# Patient Record
Sex: Male | Born: 1945 | Race: Black or African American | Hispanic: No | Marital: Married | Smoking: Former smoker
Health system: Southern US, Community
[De-identification: ages and names within clinical notes are randomized; demographics above are authoritative.]

## PROBLEM LIST (undated history)

## (undated) DIAGNOSIS — I10 Essential (primary) hypertension: Secondary | ICD-10-CM

## (undated) DIAGNOSIS — Z9889 Other specified postprocedural states: Secondary | ICD-10-CM

## (undated) DIAGNOSIS — E785 Hyperlipidemia, unspecified: Secondary | ICD-10-CM

## (undated) DIAGNOSIS — I2699 Other pulmonary embolism without acute cor pulmonale: Secondary | ICD-10-CM

## (undated) DIAGNOSIS — N189 Chronic kidney disease, unspecified: Secondary | ICD-10-CM

## (undated) DIAGNOSIS — R7303 Prediabetes: Secondary | ICD-10-CM

## (undated) DIAGNOSIS — K219 Gastro-esophageal reflux disease without esophagitis: Secondary | ICD-10-CM

## (undated) DIAGNOSIS — D132 Benign neoplasm of duodenum: Secondary | ICD-10-CM

## (undated) DIAGNOSIS — E119 Type 2 diabetes mellitus without complications: Secondary | ICD-10-CM

## (undated) DIAGNOSIS — M109 Gout, unspecified: Secondary | ICD-10-CM

## (undated) DIAGNOSIS — Z8719 Personal history of other diseases of the digestive system: Secondary | ICD-10-CM

## (undated) DIAGNOSIS — G4730 Sleep apnea, unspecified: Secondary | ICD-10-CM

## (undated) DIAGNOSIS — Z86010 Personal history of colonic polyps: Secondary | ICD-10-CM

## (undated) DIAGNOSIS — Z860101 Personal history of adenomatous and serrated colon polyps: Secondary | ICD-10-CM

## (undated) DIAGNOSIS — D649 Anemia, unspecified: Secondary | ICD-10-CM

## (undated) HISTORY — DX: Benign neoplasm of duodenum: D13.2

## (undated) HISTORY — DX: Personal history of adenomatous and serrated colon polyps: Z86.0101

## (undated) HISTORY — PX: ABDOMINAL SURGERY: SHX537

## (undated) HISTORY — DX: Hyperlipidemia, unspecified: E78.5

## (undated) HISTORY — DX: Gout, unspecified: M10.9

## (undated) HISTORY — DX: Gastro-esophageal reflux disease without esophagitis: K21.9

## (undated) HISTORY — DX: Personal history of colonic polyps: Z86.010

## (undated) HISTORY — PX: OTHER SURGICAL HISTORY: SHX169

## (undated) HISTORY — DX: Other pulmonary embolism without acute cor pulmonale: I26.99

## (undated) HISTORY — PX: HERNIA REPAIR: SHX51

## (undated) HISTORY — DX: Essential (primary) hypertension: I10

---

## 1979-11-01 HISTORY — PX: ABDOMINAL SURGERY: SHX537

## 1998-05-29 ENCOUNTER — Ambulatory Visit (HOSPITAL_COMMUNITY): Admission: RE | Admit: 1998-05-29 | Discharge: 1998-05-29 | Payer: Self-pay | Admitting: Neurosurgery

## 1998-06-12 ENCOUNTER — Ambulatory Visit (HOSPITAL_COMMUNITY): Admission: RE | Admit: 1998-06-12 | Discharge: 1998-06-13 | Payer: Self-pay | Admitting: Neurosurgery

## 1998-06-26 ENCOUNTER — Encounter: Payer: Self-pay | Admitting: Neurosurgery

## 1998-06-26 ENCOUNTER — Ambulatory Visit (HOSPITAL_COMMUNITY): Admission: RE | Admit: 1998-06-26 | Discharge: 1998-06-27 | Payer: Self-pay | Admitting: Neurosurgery

## 2007-02-01 ENCOUNTER — Ambulatory Visit: Payer: Self-pay | Admitting: Internal Medicine

## 2012-06-14 DIAGNOSIS — R109 Unspecified abdominal pain: Secondary | ICD-10-CM

## 2012-07-04 ENCOUNTER — Encounter: Payer: Self-pay | Admitting: Physician Assistant

## 2012-07-04 DIAGNOSIS — R072 Precordial pain: Secondary | ICD-10-CM

## 2012-07-04 DIAGNOSIS — I471 Supraventricular tachycardia: Secondary | ICD-10-CM

## 2012-07-05 ENCOUNTER — Encounter: Payer: Self-pay | Admitting: Physician Assistant

## 2012-07-05 DIAGNOSIS — I219 Acute myocardial infarction, unspecified: Secondary | ICD-10-CM

## 2013-01-22 ENCOUNTER — Telehealth: Payer: Self-pay | Admitting: Nurse Practitioner

## 2013-01-22 ENCOUNTER — Other Ambulatory Visit: Payer: Medicare HMO

## 2013-01-22 ENCOUNTER — Other Ambulatory Visit: Payer: Self-pay | Admitting: Nurse Practitioner

## 2013-01-22 DIAGNOSIS — R000 Tachycardia, unspecified: Secondary | ICD-10-CM

## 2013-01-22 NOTE — Telephone Encounter (Signed)
Looks like sinus tachycardia. Does he have appointment with cardiology? Was suppose to have come in on 3/17 for INR. Will need to call and make appointment with coumadin clinic.

## 2013-01-22 NOTE — Telephone Encounter (Signed)
Referral sent 

## 2013-01-22 NOTE — Telephone Encounter (Signed)
Oops. See previous note

## 2013-01-22 NOTE — Telephone Encounter (Signed)
Patient was waiting for the results of his holter moniter and he needs to know if he is suppose to come in and have his protime checked.

## 2013-01-22 NOTE — Telephone Encounter (Signed)
Patient is going to make INR appointment today.  Would like a referral to cardiologist please set one up he dose not have one.

## 2013-01-23 ENCOUNTER — Ambulatory Visit (INDEPENDENT_AMBULATORY_CARE_PROVIDER_SITE_OTHER): Payer: Medicare HMO | Admitting: Pharmacist

## 2013-01-23 DIAGNOSIS — I2699 Other pulmonary embolism without acute cor pulmonale: Secondary | ICD-10-CM | POA: Insufficient documentation

## 2013-01-23 LAB — POCT INR: INR: 6.1

## 2013-01-24 ENCOUNTER — Telehealth: Payer: Self-pay | Admitting: *Deleted

## 2013-01-24 NOTE — Telephone Encounter (Signed)
Patient notified that 24 hr holter monitor was normal.

## 2013-01-28 ENCOUNTER — Telehealth: Payer: Self-pay | Admitting: Family Medicine

## 2013-01-28 NOTE — Telephone Encounter (Signed)
Spoke with [facility] and they could not find pt's result

## 2013-01-29 ENCOUNTER — Ambulatory Visit (INDEPENDENT_AMBULATORY_CARE_PROVIDER_SITE_OTHER): Payer: Medicare HMO | Admitting: Pharmacist

## 2013-01-29 DIAGNOSIS — I2699 Other pulmonary embolism without acute cor pulmonale: Secondary | ICD-10-CM

## 2013-01-29 LAB — POCT INR: INR: 1.4

## 2013-02-06 ENCOUNTER — Encounter: Payer: Self-pay | Admitting: Cardiology

## 2013-02-06 ENCOUNTER — Other Ambulatory Visit: Payer: Self-pay | Admitting: Pharmacist

## 2013-02-06 ENCOUNTER — Ambulatory Visit (INDEPENDENT_AMBULATORY_CARE_PROVIDER_SITE_OTHER): Payer: Medicare PPO | Admitting: Cardiology

## 2013-02-06 VITALS — BP 131/73 | HR 69 | Ht 69.0 in | Wt 199.0 lb

## 2013-02-06 DIAGNOSIS — R002 Palpitations: Secondary | ICD-10-CM | POA: Insufficient documentation

## 2013-02-06 NOTE — Progress Notes (Signed)
HPI The patient presents for evaluation of possible supraventricular tachycardia. He was hospitalized with a small bowel obstruction at a [facility] last year. I was able to retrieve the discharge summary.  And after some effort was able to get some other results transmitted. The patient did indeed have an SVT possible ectopic atrial arrhythmia treated with beta blocker. Echocardiogram demonstrated left ventricular hypertrophy. There was some RV dysfunction consistent with pulmonary embolism which was confirmed by CT.Marland Kitchen The patient has been on warfarin. This was to continue for 6 months. The TEE was apparently related to the acute hospitalization and immobility. He never had any problems before this with coagulopathies or hypercoagulable states.  He doesn't recall having any arrhythmias. He was recently placed on a Holter monitor which demonstrated sinus rhythm with rare PACs. He denies any presyncope or syncope. He doesn't notice any palpitations. He denies any chest pressure, neck or arm discomfort. He works at the time pouring concrete.  No Known Allergies  Current Outpatient Prescriptions  Medication Sig Dispense Refill  . allopurinol (ZYLOPRIM) 100 MG tablet Take 100 mg by mouth daily.      Marland Kitchen atorvastatin (LIPITOR) 40 MG tablet Take 40 mg by mouth daily.      . fenofibrate 160 MG tablet Take 160 mg by mouth daily.      Marland Kitchen lisinopril-hydrochlorothiazide (PRINZIDE,ZESTORETIC) 20-25 MG per tablet Take 1 tablet by mouth daily.      . metoprolol (LOPRESSOR) 50 MG tablet Take 50 mg by mouth 2 (two) times daily.      . pantoprazole (PROTONIX) 40 MG tablet Take 40 mg by mouth daily.      Marland Kitchen warfarin (COUMADIN) 10 MG tablet Take 10 mg by mouth daily.       No current facility-administered medications for this visit.    Past Medical History  Diagnosis Date  . Hypertension   . GERD (gastroesophageal reflux disease)   . Hyperlipidemia   . Gout   . PE (pulmonary embolism)     No past surgical  history on file.  Family History  Problem Relation Age of Onset  . Hypertension Mother   . Diabetes Mother   . Hypertension Father   . Ulcers Father   . Hypertension Sister   . Diabetes Sister   . Diabetes Sister     History   Social History  . Marital Status: Married    Spouse Name: N/A    Number of Children: N/A  . Years of Education: N/A   Occupational History  . Not on file.   Social History Main Topics  . Smoking status: Former Smoker    Quit date: 02/07/1995  . Smokeless tobacco: Not on file  . Alcohol Use: Not on file  . Drug Use: Not on file  . Sexually Active: Not on file   Other Topics Concern  . Not on file   Social History Narrative  . No narrative on file    ROS:  Positive for reflux, right leg varicose veins. Otherwise as stated in the HPI and negative for all other systems.  PHYSICAL EXAM BP 131/73  Pulse 69  Ht 5\' 9"  (1.753 m)  Wt 199 lb (90.266 kg)  BMI 29.37 kg/m2 GENERAL:  Well appearing HEENT:  Pupils equal round and reactive, fundi not visualized, oral mucosa unremarkable NECK:  No jugular venous distention, waveform within normal limits, carotid upstroke brisk and symmetric, no bruits, no thyromegaly LYMPHATICS:  No cervical, inguinal adenopathy LUNGS:  Clear to auscultation bilaterally  BACK:  No CVA tenderness CHEST:  Unremarkable HEART:  PMI not displaced or sustained,S1 and S2 within normal limits, no S3, no S4, no clicks, no rubs, no murmurs ABD:  Flat, positive bowel sounds normal in frequency in pitch, no bruits, no rebound, no guarding, no midline pulsatile mass, no hepatomegaly, no splenomegaly EXT:  2 plus pulses throughout, no edema, no cyanosis no clubbing SKIN:  No rashes no nodules NEURO:  Cranial nerves II through XII grossly intact, motor grossly intact throughout PSYCH:  Cognitively intact, oriented to person place and time   EKG:  Sinus, rate and 69, axis within normal limits, intervals within normal limits, diffuse  inferolateral T wave inversion.02/06/2013    ASSESSMENT AND PLAN  SVT:  I have reviewed the hospital records.  He had self-limited SVT and no further therapy is indicated. He will remain on the meds as listed.  PULMONARY EMBOLI:  He has completed a course of anticoagulation and can stop warfarin.  ABNORMAL EKG:  He does have some LVH on echo probably related to hypertension. No further evaluation is warranted. We will continue with the meds as listed.   (Greater than 40 minutes reviewing all data with greater than 50% face to face with the patient).

## 2013-02-06 NOTE — Patient Instructions (Addendum)
Please stop your Coumadin.  Continue all ot her medications as listed.  Follow up as needed

## 2013-02-25 ENCOUNTER — Ambulatory Visit (INDEPENDENT_AMBULATORY_CARE_PROVIDER_SITE_OTHER): Payer: Medicare HMO | Admitting: Pharmacist

## 2013-02-25 DIAGNOSIS — I2699 Other pulmonary embolism without acute cor pulmonale: Secondary | ICD-10-CM

## 2013-04-12 ENCOUNTER — Ambulatory Visit (INDEPENDENT_AMBULATORY_CARE_PROVIDER_SITE_OTHER): Payer: Medicare HMO

## 2013-04-12 ENCOUNTER — Ambulatory Visit (INDEPENDENT_AMBULATORY_CARE_PROVIDER_SITE_OTHER): Payer: Medicare HMO | Admitting: Nurse Practitioner

## 2013-04-12 VITALS — BP 142/80 | HR 59 | Temp 97.1°F | Ht 69.0 in | Wt 200.0 lb

## 2013-04-12 DIAGNOSIS — M549 Dorsalgia, unspecified: Secondary | ICD-10-CM

## 2013-04-12 DIAGNOSIS — E785 Hyperlipidemia, unspecified: Secondary | ICD-10-CM

## 2013-04-12 DIAGNOSIS — I10 Essential (primary) hypertension: Secondary | ICD-10-CM | POA: Insufficient documentation

## 2013-04-12 DIAGNOSIS — K219 Gastro-esophageal reflux disease without esophagitis: Secondary | ICD-10-CM | POA: Insufficient documentation

## 2013-04-12 LAB — POCT URINALYSIS DIPSTICK

## 2013-04-12 LAB — POCT CBC
HCT, POC: 31.5 % — AB (ref 43.5–53.7)
MCHC: 35.4 g/dL (ref 31.8–35.4)
POC Granulocyte: 1.8 — AB (ref 2–6.9)
POC LYMPH PERCENT: 55.7 %L — AB (ref 10–50)
RDW, POC: 13.5 %
WBC: 4.9 10*3/uL (ref 4.6–10.2)

## 2013-04-12 LAB — POCT UA - MICROSCOPIC ONLY: RBC, urine, microscopic: NEGATIVE

## 2013-04-12 MED ORDER — IBUPROFEN 800 MG PO TABS: 800.0000 mg | ORAL_TABLET | Freq: Three times a day (TID) | ORAL | Status: DC | PRN

## 2013-04-12 MED ORDER — CYCLOBENZAPRINE HCL 5 MG PO TABS: 5.0000 mg | ORAL_TABLET | Freq: Three times a day (TID) | ORAL | Status: DC | PRN

## 2013-04-12 NOTE — Patient Instructions (Signed)

## 2013-04-12 NOTE — Progress Notes (Signed)
  Subjective:    Patient ID: Andrew Adams, male    DOB: [birth year], 68 y.o.   MRN: [id number]  HPI Patient in today c/o right flank pain- started 2 weeks ago- has gotten worse- Patient hasn't taken anything other than Ibuprofen which has helped a little . Nothing seems to help the pain- twisting body to the left increases pain. Walking increases pain. Rates pain a 7-8/10.    Review of Systems  Constitutional: Negative for fever, appetite change and fatigue.  Respiratory: Negative.   Cardiovascular: Negative.   Genitourinary: Negative for dysuria, hematuria, scrotal swelling and testicular pain.  Musculoskeletal: Positive for back pain.       Objective:   Physical Exam  Constitutional: He appears well-developed and well-nourished.  Cardiovascular: Normal rate and normal heart sounds.   Pulmonary/Chest: Effort normal and breath sounds normal.  Pain along 7th rib posteriorly  Genitourinary:  No CVA tenderness  Musculoskeletal:  Decrease ROM of upper body due to pain on rotation to the left.    BP 142/80  Pulse 59  Temp(Src) 97.1 F (36.2 C) (Oral)  Ht 5\' 9"  (1.753 m)  Wt 200 lb (90.719 kg)  BMI 29.52 kg/m2 Chest xray- no acute findings-Preliminary reading by Ronnald Collum, FNP  [facility] KUB- Moderate amount stool in colon-.mmmx  .this     Assessment & Plan:  Right mid back pain  All labs and xrays negative  Motrin 800 mg 1 po Q6 prn  Flexeril 5 mg 1 po tid prn  Moist heat  Rest  Avoid heavy lifting  Mary-Margaret Hassell Done, FNP

## 2013-04-12 NOTE — Addendum Note (Signed)
Addended by: Chevis Pretty on: 04/12/2013 03:09 PM   Modules accepted: Orders

## 2013-04-13 LAB — COMPLETE METABOLIC PANEL WITH GFR
Alkaline Phosphatase: 24 U/L — ABNORMAL LOW (ref 39–117)
GFR, Est Non African American: 52 mL/min — ABNORMAL LOW
Glucose, Bld: 96 mg/dL (ref 70–99)
Sodium: 143 mEq/L (ref 135–145)
Total Bilirubin: 0.4 mg/dL (ref 0.3–1.2)
Total Protein: 7 g/dL (ref 6.0–8.3)

## 2013-04-15 ENCOUNTER — Telehealth: Payer: Self-pay | Admitting: Nurse Practitioner

## 2013-04-23 NOTE — Telephone Encounter (Signed)
Patient aware of labs and is feeling better

## 2013-05-07 ENCOUNTER — Ambulatory Visit (INDEPENDENT_AMBULATORY_CARE_PROVIDER_SITE_OTHER): Payer: Medicare HMO | Admitting: Nurse Practitioner

## 2013-05-07 ENCOUNTER — Encounter: Payer: Self-pay | Admitting: Nurse Practitioner

## 2013-05-07 VITALS — BP 152/78 | HR 61 | Temp 97.1°F | Ht 69.0 in | Wt 198.0 lb

## 2013-05-07 DIAGNOSIS — M109 Gout, unspecified: Secondary | ICD-10-CM

## 2013-05-07 DIAGNOSIS — E785 Hyperlipidemia, unspecified: Secondary | ICD-10-CM

## 2013-05-07 DIAGNOSIS — R109 Unspecified abdominal pain: Secondary | ICD-10-CM

## 2013-05-07 DIAGNOSIS — M79644 Pain in right finger(s): Secondary | ICD-10-CM

## 2013-05-07 DIAGNOSIS — I10 Essential (primary) hypertension: Secondary | ICD-10-CM

## 2013-05-07 DIAGNOSIS — K219 Gastro-esophageal reflux disease without esophagitis: Secondary | ICD-10-CM

## 2013-05-07 DIAGNOSIS — M79609 Pain in unspecified limb: Secondary | ICD-10-CM

## 2013-05-07 LAB — COMPLETE METABOLIC PANEL WITH GFR
Albumin: 4.5 g/dL (ref 3.5–5.2)
BUN: 24 mg/dL — ABNORMAL HIGH (ref 6–23)
CO2: 31 mEq/L (ref 19–32)
Calcium: 10.1 mg/dL (ref 8.4–10.5)
Chloride: 101 mEq/L (ref 96–112)
GFR, Est African American: 48 mL/min — ABNORMAL LOW
GFR, Est Non African American: 41 mL/min — ABNORMAL LOW
Glucose, Bld: 92 mg/dL (ref 70–99)
Potassium: 4.3 mEq/L (ref 3.5–5.3)
Sodium: 140 mEq/L (ref 135–145)
Total Protein: 7 g/dL (ref 6.0–8.3)

## 2013-05-07 LAB — POCT URINALYSIS DIPSTICK
Blood, UA: NEGATIVE
Ketones, UA: NEGATIVE
Protein, UA: NEGATIVE
Spec Grav, UA: 1.01

## 2013-05-07 MED ORDER — METOPROLOL TARTRATE 50 MG PO TABS: 50.0000 mg | ORAL_TABLET | Freq: Two times a day (BID) | ORAL | Status: DC

## 2013-05-07 MED ORDER — ATORVASTATIN CALCIUM 40 MG PO TABS: 40.0000 mg | ORAL_TABLET | Freq: Every day | ORAL | Status: DC

## 2013-05-07 MED ORDER — ALLOPURINOL 100 MG PO TABS: 100.0000 mg | ORAL_TABLET | Freq: Every day | ORAL | Status: DC

## 2013-05-07 MED ORDER — PANTOPRAZOLE SODIUM 40 MG PO TBEC: 40.0000 mg | DELAYED_RELEASE_TABLET | Freq: Every day | ORAL | Status: DC

## 2013-05-07 MED ORDER — LISINOPRIL-HYDROCHLOROTHIAZIDE 20-12.5 MG PO TABS: 2.0000 | ORAL_TABLET | Freq: Every day | ORAL | Status: DC

## 2013-05-07 MED ORDER — FENOFIBRATE 160 MG PO TABS: 160.0000 mg | ORAL_TABLET | Freq: Every day | ORAL | Status: DC

## 2013-05-07 NOTE — Patient Instructions (Addendum)

## 2013-05-07 NOTE — Progress Notes (Signed)
Subjective:    Patient ID: Andrew Adams, male    DOB: 1945-11-30, 67 y.o.   MRN: [id number]  Hyperlipidemia This is a chronic problem. The current episode started more than 1 year ago. The problem is uncontrolled. Recent lipid tests were reviewed and are high. He has no history of hypothyroidism. Pertinent negatives include no leg pain, myalgias or shortness of breath. Current antihyperlipidemic treatment includes statins. The current treatment provides mild improvement of lipids. Risk factors for coronary artery disease include dyslipidemia, hypertension and male sex.  Hypertension This is a chronic problem. The current episode started more than 1 year ago. The problem has been gradually worsening since onset. The problem is uncontrolled. Pertinent negatives include no anxiety, blurred vision, neck pain, palpitations or shortness of breath. Risk factors for coronary artery disease include dyslipidemia and male gender. Past treatments include ACE inhibitors, diuretics and beta blockers. The current treatment provides no improvement.  Gastrophageal Reflux He reports no coughing or no heartburn. This is a chronic problem. The current episode started more than 1 year ago. The problem occurs rarely. The problem has been resolved. He has tried a PPI for the symptoms. The treatment provided significant relief.   * patient still c/o right thumb pain- said that it "pops and cracks" and gets stuck in flexed position. * when last seen patient was c/o right flank pain- still experiencing that- worse when he lays on left side. Bending over working increases pain- rates 5/10 when it occurs. Laying on right side causes pain to ease off.  Review of Systems  HENT: Negative for neck pain.   Eyes: Negative for blurred vision.  Respiratory: Negative for cough and shortness of breath.   Cardiovascular: Negative for palpitations.  Gastrointestinal: Negative for heartburn.  Musculoskeletal: Positive for joint  swelling (rigth thumb). Negative for myalgias.       Objective:   Physical Exam  Vitals reviewed. Constitutional: He is oriented to person, place, and time. He appears well-developed and well-nourished.  HENT:  Head: Normocephalic.  Right Ear: External ear normal.  Left Ear: External ear normal.  Eyes: Pupils are equal, round, and reactive to light.  Neck: Neck supple. No thyromegaly present.  Cardiovascular: Normal rate, regular rhythm, normal heart sounds and intact distal pulses.   Pulmonary/Chest: Effort normal and breath sounds normal.  Abdominal: Soft. Bowel sounds are normal. There is no tenderness.  Genitourinary:  Right CVA tenderness  Musculoskeletal: Normal range of motion. He exhibits no edema.  Pain at base of right thumb with thumb flexion- unable to extend thumb independently must passively extend thumb  Neurological: He is alert and oriented to person, place, and time.  Skin: Skin is warm and dry.  Psychiatric: He has a normal mood and affect. His behavior is normal. Judgment and thought content normal.    BP 156/93  Pulse 61  Temp(Src) 97.1 F (36.2 C) (Oral)  Ht 5\' 9"  (1.753 m)  Wt 198 lb (89.812 kg)  BMI 29.23 kg/m2  Results for orders placed in visit on 05/07/13  POCT URINALYSIS DIPSTICK      Result Value Range   Color, UA yellow     Clarity, UA clear     Glucose, UA neg     Bilirubin, UA neg     Ketones, UA neg     Spec Grav, UA 1.010     Blood, UA neg     pH, UA 6.0     Protein, UA neg     Urobilinogen, UA  negative     Nitrite, UA neg     Leukocytes, UA Negative         Assessment & Plan:  1. Hypertension Low NA+ diet - lisinopril-hydrochlorothiazide (ZESTORETIC) 20-12.5 MG per tablet; Take 2 tablets by mouth daily.  Dispense: 180 tablet; Refill: 1 - metoprolol (LOPRESSOR) 50 MG tablet; Take 1 tablet (50 mg total) by mouth 2 (two) times daily.  Dispense: 180 tablet; Refill: 1 - COMPLETE METABOLIC PANEL WITH GFR  2. Hyperlipidemia with  target LDL less than 100 Low fat diet and exercise - atorvastatin (LIPITOR) 40 MG tablet; Take 1 tablet (40 mg total) by mouth daily.  Dispense: 90 tablet; Refill: 1 - fenofibrate 160 MG tablet; Take 1 tablet (160 mg total) by mouth daily.  Dispense: 90 tablet; Refill: 1 - NMR Lipoprofile with Lipids  3. GERD (gastroesophageal reflux disease) Avoid eating 2 hours prior to bedtime - pantoprazole (PROTONIX) 40 MG tablet; Take 1 tablet (40 mg total) by mouth daily.  Dispense: 90 tablet; Refill: 1  4. Gout  - allopurinol (ZYLOPRIM) 100 MG tablet; Take 1 tablet (100 mg total) by mouth daily.  Dispense: 90 tablet; Refill: 1  5. Right flank pain Force fluids Moist heat Stretching exercises - POCT urinalysis dipstick  6. Thumb pain, right Referral to ortho  Mary-Margaret Hassell Done, FNP

## 2013-05-08 LAB — NMR LIPOPROFILE WITH LIPIDS
HDL Size: 8.1 nm — ABNORMAL LOW (ref >=9.2)
LDL Size: 20.3 nm — ABNORMAL LOW (ref >20.5)
Large HDL-P: <1.3 umol/L — ABNORMAL LOW (ref >=4.8)
Large VLDL-P: <0.8 nmol/L (ref <=2.7)
Small LDL Particle Number: 243 nmol/L (ref <=527)
Triglycerides: 76 mg/dL (ref <150)

## 2013-09-16 ENCOUNTER — Telehealth: Payer: Self-pay | Admitting: Nurse Practitioner

## 2013-09-16 NOTE — Telephone Encounter (Signed)
Where is hernia

## 2013-09-17 NOTE — Telephone Encounter (Signed)
On his stomach would like to go to doctor Juliann Pulse that's who done the first surgery

## 2013-09-17 NOTE — Telephone Encounter (Signed)
ok 

## 2013-09-17 NOTE — Telephone Encounter (Signed)
[facility] in [place] patient states the hernia is above the navle. Has an appointment with you in the morning will just go over it with you then.

## 2013-09-17 NOTE — Telephone Encounter (Signed)
Can't find Dr. Tye Maryland in computer. Who is they want to see?- Is hernia in belly button or where?

## 2013-09-18 ENCOUNTER — Ambulatory Visit (INDEPENDENT_AMBULATORY_CARE_PROVIDER_SITE_OTHER): Payer: Medicare HMO | Admitting: Nurse Practitioner

## 2013-09-18 ENCOUNTER — Encounter: Payer: Self-pay | Admitting: Nurse Practitioner

## 2013-09-18 VITALS — BP 142/85 | HR 58 | Temp 97.0°F | Ht 69.0 in | Wt 200.0 lb

## 2013-09-18 DIAGNOSIS — M109 Gout, unspecified: Secondary | ICD-10-CM

## 2013-09-18 DIAGNOSIS — E785 Hyperlipidemia, unspecified: Secondary | ICD-10-CM

## 2013-09-18 DIAGNOSIS — I10 Essential (primary) hypertension: Secondary | ICD-10-CM

## 2013-09-18 DIAGNOSIS — K432 Incisional hernia without obstruction or gangrene: Secondary | ICD-10-CM

## 2013-09-18 DIAGNOSIS — Z125 Encounter for screening for malignant neoplasm of prostate: Secondary | ICD-10-CM

## 2013-09-18 DIAGNOSIS — K219 Gastro-esophageal reflux disease without esophagitis: Secondary | ICD-10-CM

## 2013-09-18 MED ORDER — ATORVASTATIN CALCIUM 40 MG PO TABS: 40.0000 mg | ORAL_TABLET | Freq: Every day | ORAL | Status: DC

## 2013-09-18 MED ORDER — PANTOPRAZOLE SODIUM 40 MG PO TBEC: 40.0000 mg | DELAYED_RELEASE_TABLET | Freq: Every day | ORAL | Status: DC

## 2013-09-18 MED ORDER — AMLODIPINE BESYLATE 5 MG PO TABS: 5.0000 mg | ORAL_TABLET | Freq: Every day | ORAL | Status: DC

## 2013-09-18 MED ORDER — FENOFIBRATE 160 MG PO TABS: 160.0000 mg | ORAL_TABLET | Freq: Every day | ORAL | Status: DC

## 2013-09-18 MED ORDER — LISINOPRIL-HYDROCHLOROTHIAZIDE 20-12.5 MG PO TABS: 2.0000 | ORAL_TABLET | Freq: Every day | ORAL | Status: DC

## 2013-09-18 MED ORDER — ALLOPURINOL 100 MG PO TABS: 100.0000 mg | ORAL_TABLET | Freq: Every day | ORAL | Status: DC

## 2013-09-18 NOTE — Progress Notes (Signed)
Subjective:    Patient ID: Andrew Adams, male    DOB: 10/06/1946, 67 y.o.   MRN: [id number]  Hyperlipidemia This is a chronic problem. The current episode started more than 1 year ago. The problem is uncontrolled. Recent lipid tests were reviewed and are high. He has no history of hypothyroidism. Pertinent negatives include no leg pain, myalgias or shortness of breath. Current antihyperlipidemic treatment includes statins. The current treatment provides mild improvement of lipids. Risk factors for coronary artery disease include dyslipidemia, hypertension and male sex.  Hypertension This is a chronic problem. The current episode started more than 1 year ago. The problem has been gradually worsening since onset. The problem is uncontrolled. Pertinent negatives include no anxiety, blurred vision, neck pain, palpitations or shortness of breath. Risk factors for coronary artery disease include dyslipidemia and male gender. Past treatments include ACE inhibitors, diuretics and beta blockers. The current treatment provides no improvement.  Gastrophageal Reflux He reports no coughing or no heartburn. This is a chronic problem. The current episode started more than 1 year ago. The problem occurs rarely. The problem has been resolved. He has tried a PPI for the symptoms. The treatment provided significant relief.   * Patient c/o trouble maintaining an erection since we added metoprolol- he would like to stop taking to see if it helps.    * Review of Systems  Eyes: Negative for blurred vision.  Respiratory: Negative for cough and shortness of breath.   Cardiovascular: Negative for palpitations.  Gastrointestinal: Negative for heartburn.  Musculoskeletal: Positive for joint swelling (rigth thumb). Negative for myalgias and neck pain.       Objective:   Physical Exam  Vitals reviewed. Constitutional: He is oriented to person, place, and time. He appears well-developed and well-nourished.  HENT:   Head: Normocephalic.  Right Ear: External ear normal.  Left Ear: External ear normal.  Eyes: Pupils are equal, round, and reactive to light.  Neck: Neck supple. No thyromegaly present.  Cardiovascular: Normal rate, regular rhythm, normal heart sounds and intact distal pulses.   Pulmonary/Chest: Effort normal and breath sounds normal.  Abdominal: Soft. Bowel sounds are normal. There is no tenderness.  Incisional hernia upper abdominal wall  Genitourinary:  Right CVA tenderness  Musculoskeletal: Normal range of motion. He exhibits no edema.  Pain at base of right thumb with thumb flexion- unable to extend thumb independently must passively extend thumb  Neurological: He is alert and oriented to person, place, and time.  Skin: Skin is warm and dry.  Psychiatric: He has a normal mood and affect. His behavior is normal. Judgment and thought content normal.   BP 142/85  Pulse 58  Temp(Src) 97 F (36.1 C) (Oral)  Ht 5\' 9"  (1.753 m)  Wt 200 lb (90.719 kg)  BMI 29.52 kg/m2     Assessment & Plan:   1. Hypertension   2. GERD (gastroesophageal reflux disease)   3. Hyperlipidemia with target LDL less than 100   4. Gout   5. Prostate cancer screening   6. Incisional hernia    Orders Placed This Encounter  Procedures  . CMP14+EGFR  . NMR, lipoprofile  . PSA, total and free  . Ambulatory referral to General Surgery    Referral Priority:  Routine    Referral Type:  Surgical    Referral Reason:  Specialty Services Required    Requested Specialty:  General Surgery    Number of Visits Requested:  1   Meds ordered this encounter  Medications  .  lisinopril-hydrochlorothiazide (PRINZIDE,ZESTORETIC) 20-12.5 MG per tablet    Sig: Take 2 tablets by mouth daily.    Dispense:  180 tablet    Refill:  1    Order Specific Question:  Supervising Provider    Answer:  Chipper Herb [[id number]]  . amLODipine (NORVASC) 5 MG tablet    Sig: Take 1 tablet (5 mg total) by mouth daily.    Dispense:   90 tablet    Refill:  1    Order Specific Question:  Supervising Provider    Answer:  Chipper Herb [[id number]]  . pantoprazole (PROTONIX) 40 MG tablet    Sig: Take 1 tablet (40 mg total) by mouth daily.    Dispense:  90 tablet    Refill:  1    Order Specific Question:  Supervising Provider    Answer:  Chipper Herb [[id number]]  . atorvastatin (LIPITOR) 40 MG tablet    Sig: Take 1 tablet (40 mg total) by mouth daily.    Dispense:  90 tablet    Refill:  1    Order Specific Question:  Supervising Provider    Answer:  Chipper Herb [[id number]]  . fenofibrate 160 MG tablet    Sig: Take 1 tablet (160 mg total) by mouth daily.    Dispense:  90 tablet    Refill:  1    Order Specific Question:  Supervising Provider    Answer:  Chipper Herb [[id number]]  . allopurinol (ZYLOPRIM) 100 MG tablet    Sig: Take 1 tablet (100 mg total) by mouth daily.    Dispense:  90 tablet    Refill:  1    Order Specific Question:  Supervising Provider    Answer:  Chipper Herb [[id number]]  stopped metoprolol- added norvasc 5 mg daily- patient will let me know if has any problems Keep diary of blood pressure Continue all meds Labs pending Diet and exercise encouraged Health maintenance reviewed Follow up in 3 months  [place], FNP

## 2013-09-18 NOTE — Patient Instructions (Signed)

## 2013-09-19 LAB — CMP14+EGFR
AST: 20 IU/L (ref 0–40)
Alkaline Phosphatase: 23 IU/L — ABNORMAL LOW (ref 39–117)
BUN/Creatinine Ratio: 14 (ref 10–22)
BUN: 18 mg/dL (ref 8–27)
CO2: 29 mmol/L (ref 18–29)
Calcium: 9.6 mg/dL (ref 8.6–10.2)
Chloride: 96 mmol/L — ABNORMAL LOW (ref 97–108)
Creatinine, Ser: 1.33 mg/dL — ABNORMAL HIGH (ref 0.76–1.27)
GFR calc Af Amer: 63 mL/min/{1.73_m2} (ref >59)
Globulin, Total: 2.2 g/dL (ref 1.5–4.5)
Sodium: 139 mmol/L (ref 134–144)
Total Bilirubin: 0.5 mg/dL (ref 0.0–1.2)

## 2013-09-19 LAB — NMR, LIPOPROFILE
Cholesterol: 114 mg/dL (ref <200)
HDL Particle Number: 37.6 umol/L (ref >=30.5)
LDL Particle Number: 697 nmol/L (ref <1000)
LDLC SERPL CALC-MCNC: 54 mg/dL (ref <100)
LP-IR Score: 42 (ref <=45)

## 2013-09-19 LAB — PSA, TOTAL AND FREE
PSA, Free Pct: 36.7 %
PSA: 0.3 ng/mL (ref 0.0–4.0)

## 2013-10-01 NOTE — Telephone Encounter (Signed)
Pt aware of elevated kidney test and other labs normal.

## 2013-11-19 ENCOUNTER — Telehealth: Payer: Self-pay | Admitting: *Deleted

## 2013-11-19 NOTE — Telephone Encounter (Signed)
Telephone call from pt. C/o tongue swelling. Pt having a hard time talking tongue is so swollen. It started yesterday and has gotten worse. No shortness of breath. Had hernia surgery last week per patient. Pt lives in [place]. Advised pt to go to [facility] ER now. Patient will have someone drive him. Pt verbalized understanding and will go to ER now.

## 2013-11-20 ENCOUNTER — Telehealth: Payer: Self-pay | Admitting: Nurse Practitioner

## 2013-11-20 NOTE — Telephone Encounter (Signed)
appt for Friday at 9:45

## 2013-11-22 ENCOUNTER — Encounter: Payer: Self-pay | Admitting: Nurse Practitioner

## 2013-11-22 ENCOUNTER — Ambulatory Visit (INDEPENDENT_AMBULATORY_CARE_PROVIDER_SITE_OTHER): Payer: Medicare HMO | Admitting: Nurse Practitioner

## 2013-11-22 VITALS — BP 128/73 | HR 70 | Temp 98.7°F | Ht 69.0 in | Wt 199.0 lb

## 2013-11-22 DIAGNOSIS — Z09 Encounter for follow-up examination after completed treatment for conditions other than malignant neoplasm: Secondary | ICD-10-CM

## 2013-11-22 DIAGNOSIS — Z87898 Personal history of other specified conditions: Secondary | ICD-10-CM

## 2013-11-22 DIAGNOSIS — I10 Essential (primary) hypertension: Secondary | ICD-10-CM

## 2013-11-22 MED ORDER — METOPROLOL TARTRATE 50 MG PO TABS: 50.0000 mg | ORAL_TABLET | Freq: Two times a day (BID) | ORAL | Status: DC

## 2013-11-22 NOTE — Progress Notes (Signed)
   Subjective:    Patient ID: Andrew Adams, male    DOB: 1946/02/10, 68 y.o.   MRN: [id number]  HPI  Patient here today for hospital follow up- he had angio edema and tongue swelling from lisinopril- Stay ed in hospital for several hours until swelling started to resolve- Patient started taking metoprolol blood pressure meds that he had at home. We had changed him because he thought the metoprolol was interferring with his sexual performance. Says he has not had any problem ssince going back on metoprolol.    Review of Systems  Constitutional: Negative.   HENT: Negative.   Respiratory: Negative.   Cardiovascular: Negative.   Gastrointestinal: Negative.   All other systems reviewed and are negative.       Objective:   Physical Exam  Constitutional: He is oriented to person, place, and time. He appears well-developed and well-nourished.  Cardiovascular: Normal rate, regular rhythm and normal heart sounds.   Pulmonary/Chest: Effort normal and breath sounds normal.  Neurological: He is alert and oriented to person, place, and time.  Skin: Skin is warm.  Psychiatric: He has a normal mood and affect. His behavior is normal. Judgment and thought content normal.   BP 128/73  Pulse 70  Temp(Src) 98.7 F (37.1 C) (Oral)  Ht 5\' 9"  (1.753 m)  Wt 199 lb (90.266 kg)  BMI 29.37 kg/m2        Assessment & Plan:   1. Hospital discharge follow-up   2. H/O angioedema    Continue metoprolol as rx Follow up in 3 months  [place], FNP

## 2013-11-22 NOTE — Patient Instructions (Signed)

## 2013-11-29 ENCOUNTER — Ambulatory Visit (INDEPENDENT_AMBULATORY_CARE_PROVIDER_SITE_OTHER): Payer: Medicare HMO | Admitting: Nurse Practitioner

## 2013-11-29 ENCOUNTER — Ambulatory Visit (INDEPENDENT_AMBULATORY_CARE_PROVIDER_SITE_OTHER): Payer: Medicare HMO

## 2013-11-29 VITALS — BP 150/87 | HR 62 | Temp 98.9°F | Ht 69.0 in | Wt 199.0 lb

## 2013-11-29 DIAGNOSIS — M545 Low back pain, unspecified: Secondary | ICD-10-CM

## 2013-11-29 DIAGNOSIS — R109 Unspecified abdominal pain: Secondary | ICD-10-CM

## 2013-11-29 DIAGNOSIS — R10A1 Flank pain, right side: Secondary | ICD-10-CM

## 2013-11-29 LAB — POCT URINALYSIS DIPSTICK
BILIRUBIN UA: NEGATIVE
Glucose, UA: NEGATIVE
Ketones, UA: NEGATIVE
Leukocytes, UA: NEGATIVE
Nitrite, UA: NEGATIVE
Protein, UA: NEGATIVE
RBC UA: NEGATIVE
SPEC GRAV UA: 1.01
UROBILINOGEN UA: NEGATIVE
pH, UA: 7

## 2013-11-29 NOTE — Progress Notes (Signed)
   Subjective:    Patient ID: Andrew Adams, male    DOB: 14-Sep-1946, 68 y.o.   MRN: [id number]  HPI Patient woke up Wednesday morning with right lower back pain- took pain pills he had at home which helped some- Started hurting again yesterday- hurts worse to lay down. Rates pain 8/10.    Review of Systems  Constitutional: Negative.   HENT: Negative.   Respiratory: Negative.   Cardiovascular: Negative.   Genitourinary: Negative for dysuria, urgency and frequency.  Musculoskeletal: Positive for back pain.  Hematological: Negative.   Psychiatric/Behavioral: Negative.   All other systems reviewed and are negative.       Objective:   Physical Exam  Constitutional: He is oriented to person, place, and time. He appears well-developed and well-nourished.  Cardiovascular: Normal rate, regular rhythm and normal heart sounds.   Pulmonary/Chest: Effort normal and breath sounds normal.  Abdominal: Soft. Bowel sounds are normal.  Genitourinary:  Mild right CVA tenderness  Musculoskeletal:  FRom of lumbar spine with pain on extension Negative SLR bil  Neurological: He is alert and oriented to person, place, and time. He has normal reflexes. No cranial nerve deficit.  Skin: Skin is warm.  Psychiatric: He has a normal mood and affect. His behavior is normal. Judgment and thought content normal.    BP 150/87  Pulse 62  Temp(Src) 98.9 F (37.2 C) (Oral)  Ht 5\' 9"  (1.753 m)  Wt 199 lb (90.266 kg)  BMI 29.37 kg/m2  KUB negative-Preliminary reading by Ronnald Collum, FNP  [facility] Results for orders placed in visit on 11/29/13  POCT URINALYSIS DIPSTICK      Result Value Range   Color, UA gold     Clarity, UA clear     Glucose, UA neg     Bilirubin, UA neg     Ketones, UA neg     Spec Grav, UA 1.010     Blood, UA neg     pH, UA 7.0     Protein, UA neg     Urobilinogen, UA negative     Nitrite, UA neg     Leukocytes, UA Negative          Assessment & Plan:   1. Right flank pain    2. Right low back pain    Pain meds and muscle relaxer as rx Moist heat No strenous activity Follow up if not improving  Mary-Margaret Hassell Done, FNP

## 2013-11-29 NOTE — Patient Instructions (Signed)
Back Pain, Adult Low back pain is very common. About 1 in 5 people have back pain.The cause of low back pain is rarely dangerous. The pain often gets better over time.About half of people with a sudden onset of back pain feel better in just 2 weeks. About 8 in 10 people feel better by 6 weeks.  CAUSES Some common causes of back pain include:  Strain of the muscles or ligaments supporting the spine.  Wear and tear (degeneration) of the spinal discs.  Arthritis.  Direct injury to the back. DIAGNOSIS Most of the time, the direct cause of low back pain is not known.However, back pain can be treated effectively even when the exact cause of the pain is unknown.Answering your caregiver's questions about your overall health and symptoms is one of the most accurate ways to make sure the cause of your pain is not dangerous. If your caregiver needs more information, he or she may order lab work or imaging tests (X-rays or MRIs).However, even if imaging tests show changes in your back, this usually does not require surgery. HOME CARE INSTRUCTIONS For many people, back pain returns.Since low back pain is rarely dangerous, it is often a condition that people can learn to manageon their own.   Remain active. It is stressful on the back to sit or stand in one place. Do not sit, drive, or stand in one place for more than 30 minutes at a time. Take short walks on level surfaces as soon as pain allows.Try to increase the length of time you walk each day.  Do not stay in bed.Resting more than 1 or 2 days can delay your recovery.  Do not avoid exercise or work.Your body is made to move.It is not dangerous to be active, even though your back may hurt.Your back will likely heal faster if you return to being active before your pain is gone.  Pay attention to your body when you bend and lift. Many people have less discomfortwhen lifting if they bend their knees, keep the load close to their bodies,and  avoid twisting. Often, the most comfortable positions are those that put less stress on your recovering back.  Find a comfortable position to sleep. Use a firm mattress and lie on your side with your knees slightly bent. If you lie on your back, put a pillow under your knees.  Only take over-the-counter or prescription medicines as directed by your caregiver. Over-the-counter medicines to reduce pain and inflammation are often the most helpful.Your caregiver may prescribe muscle relaxant drugs.These medicines help dull your pain so you can more quickly return to your normal activities and healthy exercise.  Put ice on the injured area.  Put ice in a plastic bag.  Place a towel between your skin and the bag.  Leave the ice on for 15-20 minutes, 03-04 times a day for the first 2 to 3 days. After that, ice and heat may be alternated to reduce pain and spasms.  Ask your caregiver about trying back exercises and gentle massage. This may be of some benefit.  Avoid feeling anxious or stressed.Stress increases muscle tension and can worsen back pain.It is important to recognize when you are anxious or stressed and learn ways to manage it.Exercise is a great option. SEEK MEDICAL CARE IF:  You have pain that is not relieved with rest or medicine.  You have pain that does not improve in 1 week.  You have new symptoms.  You are generally not feeling well. SEEK   IMMEDIATE MEDICAL CARE IF:   You have pain that radiates from your back into your legs.  You develop new bowel or bladder control problems.  You have unusual weakness or numbness in your arms or legs.  You develop nausea or vomiting.  You develop abdominal pain.  You feel faint. Document Released: 10/17/2005 Document Revised: 04/17/2012 Document Reviewed: 03/07/2011 [employer] Patient Information 2014 [employer].  

## 2013-12-02 ENCOUNTER — Telehealth: Payer: Self-pay | Admitting: Nurse Practitioner

## 2013-12-02 DIAGNOSIS — K6289 Other specified diseases of anus and rectum: Secondary | ICD-10-CM

## 2013-12-02 NOTE — Telephone Encounter (Signed)
Has never seen a GI. Willing to go to [place]. Referral placed.

## 2013-12-02 NOTE — Telephone Encounter (Signed)
Needs to see GI- has he seen one in the past?

## 2013-12-06 ENCOUNTER — Encounter: Payer: Self-pay | Admitting: Gastroenterology

## 2013-12-09 ENCOUNTER — Telehealth: Payer: Self-pay | Admitting: Nurse Practitioner

## 2013-12-09 NOTE — Telephone Encounter (Signed)
Letter sent with appointment date/time 

## 2013-12-23 ENCOUNTER — Ambulatory Visit: Payer: Medicare HMO | Admitting: Nurse Practitioner

## 2014-01-08 ENCOUNTER — Encounter: Payer: Self-pay | Admitting: Gastroenterology

## 2014-01-08 ENCOUNTER — Ambulatory Visit (INDEPENDENT_AMBULATORY_CARE_PROVIDER_SITE_OTHER): Payer: Medicare PPO | Admitting: Gastroenterology

## 2014-01-08 VITALS — BP 144/82 | HR 60 | Ht 69.0 in | Wt 204.6 lb

## 2014-01-08 DIAGNOSIS — K625 Hemorrhage of anus and rectum: Secondary | ICD-10-CM

## 2014-01-08 DIAGNOSIS — K6289 Other specified diseases of anus and rectum: Secondary | ICD-10-CM

## 2014-01-08 MED ORDER — MOVIPREP 100 G PO SOLR: 1.0000 | Freq: Once | ORAL | Status: DC

## 2014-01-08 NOTE — Progress Notes (Signed)
HPI:  This is a    Very pleasant 68 yo man whom I am meeting for the first time today. He is here with his wife.  Low back pain since yesterday while riding his tractor  A month ago, rectal pain.  He knows he has hemorrhoids, they can flare with pain, itching,  bleed every 2-3 months.  Will put on prep H, last for about a week.  He does not ever have constipated.  Has been going on for about 10 years.  No chnages in his bowels, no   abd hernia repaired about a month ago.  1 year ago, SBO surgery.  alll at [facility].  Overall his weight is up over the past year.  Review of systems: Pertinent positive and negative review of systems were noted in the above HPI section. Complete review of systems was performed and was otherwise normal.    Past Medical History  Diagnosis Date  . Hypertension   . GERD (gastroesophageal reflux disease)   . Hyperlipidemia   . Gout   . PE (pulmonary embolism)     Past Surgical History  Procedure Laterality Date  . Abdominal surgery  2103    Smal bowel obstruction   . Abdominal surgery  1981    Perforated large intestine    Current Outpatient Prescriptions  Medication Sig Dispense Refill  . allopurinol (ZYLOPRIM) 100 MG tablet Take 1 tablet (100 mg total) by mouth daily.  90 tablet  1  . amLODipine (NORVASC) 5 MG tablet Take 1 tablet (5 mg total) by mouth daily.  90 tablet  1  . atorvastatin (LIPITOR) 40 MG tablet Take 1 tablet (40 mg total) by mouth daily.  90 tablet  1  . fenofibrate 160 MG tablet Take 1 tablet (160 mg total) by mouth daily.  90 tablet  1  . metoprolol (LOPRESSOR) 50 MG tablet Take 1 tablet (50 mg total) by mouth 2 (two) times daily.  180 tablet  1  . pantoprazole (PROTONIX) 40 MG tablet Take 1 tablet (40 mg total) by mouth daily.  90 tablet  1   No current facility-administered medications for this visit.    Allergies as of 01/08/2014  . (No Known Allergies)    Family History  Problem Relation Age of Onset  .  Hypertension Mother   . Diabetes Mother   . Hypertension Father   . Ulcers Father   . Hypertension Sister   . Diabetes Sister   . Diabetes Sister     History   Social History  . Marital Status: Married    Spouse Name: N/A    Number of Children: 1  . Years of Education: N/A   Occupational History  . Finisher    Social History Main Topics  . Smoking status: Former Smoker    Quit date: 02/07/1995  . Smokeless tobacco: Never Used  . Alcohol Use: No  . Drug Use: No  . Sexual Activity: Not on file   Other Topics Concern  . Not on file   Social History Narrative   Lives with wife.         Physical Exam: BP 144/82  Pulse 60  Ht 5\' 9"  (1.753 m)  Wt 204 lb 9.6 oz (92.806 kg)  BMI 30.20 kg/m2 Constitutional: generally well-appearing Psychiatric: alert and oriented x3 Eyes: extraocular movements intact Mouth: oral pharynx moist, no lesions Neck: supple no lymphadenopathy Cardiovascular: heart regular rate and rhythm Lungs: clear to auscultation bilaterally Abdomen: soft, nontender, nondistended, no  obvious ascites, no peritoneal signs, normal bowel sounds Extremities: no lower extremity edema bilaterally Skin: no lesions on visible extremities Rectal examination: no external anal hemorroids, no distal rectal masses, no clear anal fissures, stool was brown and not checked for hemoccult   Assessment and plan: 68 y.o. male with intermittent rectal beeding, discomfort (hemorrhoid-like symptoms)  Today he has no evidence of anorectal disease on examination however by clinical history he does seem to have intermittent trouble with hemorrhoids or perhaps fissures. He is going to try to bulk his stool with fiber supplements and we'll proceed with colonoscopy at his soonest convenience. He has not had colon cancer screening with colonoscopy for at least 10 years from what he tells me. He is most bothered today by his sore back, he is visibly stiff. I sympathized with him and  suggested he see his primary care physician for advice on low back pain.

## 2014-01-08 NOTE — Patient Instructions (Addendum)
You have been scheduled for a colonoscopy with propofol. Please follow written instructions given to you at your visit today.  Please pick up your prep kit at the pharmacy within the next 1-3 days. If you use inhalers (even only as needed), please bring them with you on the day of your procedure. Your physician has requested that you go to www.[email or web address].com and enter the access code given to you at your visit today. This web site gives a general overview about your procedure. However, you should still follow specific instructions given to you by our office regarding your preparation for the procedure.  Please start taking citrucel (orange flavored) powder fiber supplement.  This may cause some bloating at first but that usually goes away. Begin with a small spoonful and work your way up to a large, heaping spoonful daily over a week.  

## 2014-01-09 ENCOUNTER — Encounter: Payer: Self-pay | Admitting: Family Medicine

## 2014-01-09 ENCOUNTER — Telehealth: Payer: Self-pay | Admitting: Nurse Practitioner

## 2014-01-09 ENCOUNTER — Ambulatory Visit (INDEPENDENT_AMBULATORY_CARE_PROVIDER_SITE_OTHER): Payer: Medicare HMO | Admitting: Family Medicine

## 2014-01-09 VITALS — BP 163/95 | Temp 98.8°F | Ht 69.0 in | Wt 201.6 lb

## 2014-01-09 DIAGNOSIS — N182 Chronic kidney disease, stage 2 (mild): Secondary | ICD-10-CM | POA: Insufficient documentation

## 2014-01-09 DIAGNOSIS — M5430 Sciatica, unspecified side: Secondary | ICD-10-CM | POA: Insufficient documentation

## 2014-01-09 DIAGNOSIS — M549 Dorsalgia, unspecified: Secondary | ICD-10-CM

## 2014-01-09 DIAGNOSIS — K219 Gastro-esophageal reflux disease without esophagitis: Secondary | ICD-10-CM

## 2014-01-09 DIAGNOSIS — E785 Hyperlipidemia, unspecified: Secondary | ICD-10-CM

## 2014-01-09 DIAGNOSIS — I10 Essential (primary) hypertension: Secondary | ICD-10-CM

## 2014-01-09 MED ORDER — METHYLPREDNISOLONE ACETATE 80 MG/ML IJ SUSP
80.0000 mg | Freq: Once | INTRAMUSCULAR | Status: AC
  Administered 2014-01-09: 80 mg via INTRAMUSCULAR

## 2014-01-09 MED ORDER — TRAMADOL HCL 50 MG PO TABS: 50.0000 mg | ORAL_TABLET | Freq: Three times a day (TID) | ORAL | Status: DC | PRN

## 2014-01-09 NOTE — Progress Notes (Signed)
Patient ID: Andrew Adams, male   DOB: [birth year], 68 y.o.   MRN: [id number] SUBJECTIVE: CC: Chief Complaint  Patient presents with  . Acute Visit    pain rt side and lower back no radiation down leg. was riding [employer], [employer]     HPI: Was using his riding mower to lime the lawn and while doing that over the weekend his back got stiff and spasm and having terrible sharp pain in the paralumbar area and the left buttocks  Area. No weakness. No radiculopathy down the legs. No rashes.. No red flags . No urinary or stool incontinence or saddle paresthesias.  Past Medical History  Diagnosis Date  . Hypertension   . GERD (gastroesophageal reflux disease)   . Hyperlipidemia   . Gout   . PE (pulmonary embolism)    Past Surgical History  Procedure Laterality Date  . Abdominal surgery  2103    Smal bowel obstruction   . Abdominal surgery  1981    Perforated large intestine   History   Social History  . Marital Status: Married    Spouse Name: N/A    Number of Children: 1  . Years of Education: N/A   Occupational History  . Finisher    Social History Main Topics  . Smoking status: Former Smoker    Quit date: 02/07/1995  . Smokeless tobacco: Never Used  . Alcohol Use: No  . Drug Use: No  . Sexual Activity: Not on file   Other Topics Concern  . Not on file   Social History Narrative   Lives with wife.     Family History  Problem Relation Age of Onset  . Hypertension Mother   . Diabetes Mother   . Hypertension Father   . Ulcers Father   . Hypertension Sister   . Diabetes Sister   . Diabetes Sister    Current Outpatient Prescriptions on File Prior to Visit  Medication Sig Dispense Refill  . allopurinol (ZYLOPRIM) 100 MG tablet Take 1 tablet (100 mg total) by mouth daily.  90 tablet  1  . amLODipine (NORVASC) 5 MG tablet Take 1 tablet (5 mg total) by mouth daily.  90 tablet  1  . atorvastatin (LIPITOR) 40 MG tablet Take 1 tablet (40 mg total) by mouth daily.  90 tablet  1   . fenofibrate 160 MG tablet Take 1 tablet (160 mg total) by mouth daily.  90 tablet  1  . metoprolol (LOPRESSOR) 50 MG tablet Take 1 tablet (50 mg total) by mouth 2 (two) times daily.  180 tablet  1  . pantoprazole (PROTONIX) 40 MG tablet Take 1 tablet (40 mg total) by mouth daily.  90 tablet  1  . MOVIPREP 100 G SOLR Take 1 kit (200 g total) by mouth once.  1 kit  0   No current facility-administered medications on file prior to visit.   No Known Allergies  There is no immunization history on file for this patient. Prior to Admission medications   Medication Sig Start Date End Date Taking? Authorizing Provider  allopurinol (ZYLOPRIM) 100 MG tablet Take 1 tablet (100 mg total) by mouth daily. 09/18/13  Yes Mary-Margaret Daphine Deutscher, FNP  amLODipine (NORVASC) 5 MG tablet Take 1 tablet (5 mg total) by mouth daily. 09/18/13  Yes Mary-Margaret Daphine Deutscher, FNP  atorvastatin (LIPITOR) 40 MG tablet Take 1 tablet (40 mg total) by mouth daily. 09/18/13  Yes Mary-Margaret Daphine Deutscher, FNP  cyclobenzaprine (FLEXERIL) 5 MG tablet Take 5 mg by mouth  3 (three) times daily.   Yes Historical Provider, MD  fenofibrate 160 MG tablet Take 1 tablet (160 mg total) by mouth daily. 09/18/13  Yes Mary-Margaret Hassell Done, FNP  metoprolol (LOPRESSOR) 50 MG tablet Take 1 tablet (50 mg total) by mouth 2 (two) times daily. 11/22/13  Yes Mary-Margaret Hassell Done, FNP  pantoprazole (PROTONIX) 40 MG tablet Take 1 tablet (40 mg total) by mouth daily. 09/18/13  Yes Mary-Margaret Hassell Done, FNP  MOVIPREP 100 G SOLR Take 1 kit (200 g total) by mouth once. 01/08/14   Milus Banister, MD     ROS: As above in the HPI. All other systems are stable or negative.  OBJECTIVE: APPEARANCE:  Patient in no acute distress.The patient appeared well nourished and normally developed. Acyanotic. Waist: VITAL SIGNS:BP 163/95  Temp(Src) 98.8 F (37.1 C) (Oral)  Ht _0  (1.753 m)  Wt 201 lb 9.6 oz (91.445 kg)  BMI 29.76 kg/m2   SKIN: warm and  Dry without  overt rashes, tattoos and scars  HEAD and Neck: without JVD, Head and scalp: normal Eyes:No scleral icterus. Fundi normal, eye movements normal. Ears: Auricle normal, canal normal, Tympanic membranes normal, insufflation normal. Nose: normal Throat: normal Neck & thyroid: normal  CHEST & LUNGS: Chest wall: normal Lungs: Clear  CVS: Reveals the PMI to be normally located. Regular rhythm, First and Second Heart sounds are normal,  absence of murmurs, rubs or gallops. Peripheral vasculature: Radial pulses: normal Dorsal pedis pulses: normal Posterior pulses: normal  ABDOMEN:  Appearance: normal Benign, no organomegaly, no masses, no Abdominal Aortic enlargement. No Guarding , no rebound. No Bruits. Bowel sounds: normal  RECTAL: N/A GU: N/A  EXTREMETIES: nonedematous.  MUSCULOSKELETAL:  Spine: left lumbar muscle in spasm. Patient has reduced flexion to 45 degrees. SLR negative Joints: intact Left sciatic notch is tender to palpation.  NEUROLOGIC: oriented to time,place and person; nonfocal. Strength is normal Sensory is normal Reflexes are normal Cranial Nerves are normal.  ASSESSMENT:  Acute back pain - Plan: traMADol (ULTRAM) 50 MG tablet, methylPREDNISolone acetate (DEPO-MEDROL) injection 80 mg  Hyperlipidemia  Hypertension  CKD (chronic kidney disease) stage 2, GFR 60-89 ml/min  GERD (gastroesophageal reflux disease)  Sciatica  PLAN: Discussed red flags. Handout on acute back pain. Back care advised. Keep active.  No orders of the defined types were placed in this encounter.   Meds ordered this encounter  Medications  . cyclobenzaprine (FLEXERIL) 5 MG tablet    Sig: Take 5 mg by mouth 3 (three) times daily.  . traMADol (ULTRAM) 50 MG tablet    Sig: Take 1 tablet (50 mg total) by mouth every 8 (eight) hours as needed.    Dispense:  30 tablet    Refill:  0  . methylPREDNISolone acetate (DEPO-MEDROL) injection 80 mg    Sig:    There are no  discontinued medications. Return in about 1 week (around 01/16/2014) for Recheck medical problems with MMM.  Madlyn Crosby P. Jacelyn Grip, M.D.

## 2014-01-09 NOTE — Telephone Encounter (Signed)
error 

## 2014-01-09 NOTE — Patient Instructions (Addendum)
Back Pain, Adult Low back pain is very common. About 1 in 5 people have back pain.The cause of low back pain is rarely dangerous. The pain often gets better over time.About half of people with a sudden onset of back pain feel better in just 2 weeks. About 8 in 10 people feel better by 6 weeks.  CAUSES Some common causes of back pain include:  Strain of the muscles or ligaments supporting the spine.  Wear and tear (degeneration) of the spinal discs.  Arthritis.  Direct injury to the back. DIAGNOSIS Most of the time, the direct cause of low back pain is not known.However, back pain can be treated effectively even when the exact cause of the pain is unknown.Answering your caregiver's questions about your overall health and symptoms is one of the most accurate ways to make sure the cause of your pain is not dangerous. If your caregiver needs more information, he or she may order lab work or imaging tests (X-rays or MRIs).However, even if imaging tests show changes in your back, this usually does not require surgery. HOME CARE INSTRUCTIONS For many people, back pain returns.Since low back pain is rarely dangerous, it is often a condition that people can learn to manageon their own.   Remain active. It is stressful on the back to sit or stand in one place. Do not sit, drive, or stand in one place for more than 30 minutes at a time. Take short walks on level surfaces as soon as pain allows.Try to increase the length of time you walk each day.  Do not stay in bed.Resting more than 1 or 2 days can delay your recovery.  Do not avoid exercise or work.Your body is made to move.It is not dangerous to be active, even though your back may hurt.Your back will likely heal faster if you return to being active before your pain is gone.  Pay attention to your body when you bend and lift. Many people have less discomfortwhen lifting if they bend their knees, keep the load close to their bodies,and  avoid twisting. Often, the most comfortable positions are those that put less stress on your recovering back.  Find a comfortable position to sleep. Use a firm mattress and lie on your side with your knees slightly bent. If you lie on your back, put a pillow under your knees.  Only take over-the-counter or prescription medicines as directed by your caregiver. Over-the-counter medicines to reduce pain and inflammation are often the most helpful.Your caregiver may prescribe muscle relaxant drugs.These medicines help dull your pain so you can more quickly return to your normal activities and healthy exercise.  Put ice on the injured area.  Put ice in a plastic bag.  Place a towel between your skin and the bag.  Leave the ice on for 15-20 minutes, 03-04 times a day for the first 2 to 3 days. After that, ice and heat may be alternated to reduce pain and spasms.  Ask your caregiver about trying back exercises and gentle massage. This may be of some benefit.  Avoid feeling anxious or stressed.Stress increases muscle tension and can worsen back pain.It is important to recognize when you are anxious or stressed and learn ways to manage it.Exercise is a great option. SEEK MEDICAL CARE IF:  You have pain that is not relieved with rest or medicine.  You have pain that does not improve in 1 week.  You have new symptoms.  You are generally not feeling well. SEEK   IMMEDIATE MEDICAL CARE IF:   You have pain that radiates from your back into your legs.  You develop new bowel or bladder control problems.  You have unusual weakness or numbness in your arms or legs.  You develop nausea or vomiting.  You develop abdominal pain.  You feel faint. Document Released: 10/17/2005 Document Revised: 04/17/2012 Document Reviewed: 03/07/2011 [facility] Patient Information 2014 [place], [place]. Methylprednisolone Suspension for Injection What is this medicine? METHYLPREDNISOLONE (meth ill pred NISS oh  lone) is a corticosteroid. It is commonly used to treat inflammation of the skin, joints, lungs, and other organs. Common conditions treated include asthma, allergies, and arthritis. It is also used for other conditions, such as blood disorders and diseases of the adrenal glands. This medicine may be used for other purposes; ask your health care provider or pharmacist if you have questions. COMMON BRAND NAME(S): Depmedalone-40, Depmedalone-80 , Depo-Medrol What should I tell my health care provider before I take this medicine? They need to know if you have any of these conditions: -cataracts or glaucoma -Cushings -heart disease -high blood pressure -infection including tuberculosis -low calcium or potassium levels in the blood -recent surgery -seizures -stomach or intestinal disease, including colitis -threadworms -thyroid problems -an unusual or allergic reaction to methylprednisolone, corticosteroids, benzyl alcohol, other medicines, foods, dyes, or preservatives -pregnant or trying to get pregnant -breast-feeding How should I use this medicine? This medicine is for injection into a muscle, joint, or other tissue. It is given by a health care professional in a hospital or clinic setting. Talk to your pediatrician regarding the use of this medicine in children. While this drug may be prescribed for selected conditions, precautions do apply. Overdosage: If you think you have taken too much of this medicine contact a poison control center or emergency room at once. NOTE: This medicine is only for you. Do not share this medicine with others. What if I miss a dose? This does not apply. What may interact with this medicine? Do not take this medicine with any of the following medications: -mifepristone This medicine may also interact with the following medications: -aspirin and aspirin-like  medicines -cyclosporin -ketoconazole -phenobarbital -phenytoin -rifampin -tacrolimus -troleandomycin -vaccines -warfarin This list may not describe all possible interactions. Give your health care provider a list of all the medicines, herbs, non-prescription drugs, or dietary supplements you use. Also tell them if you smoke, drink alcohol, or use illegal drugs. Some items may interact with your medicine. What should I watch for while using this medicine? Visit your doctor or health care professional for regular checks on your progress. If you are taking this medicine for a long time, carry an identification card with your name and address, the type and dose of your medicine, and your doctor's name and address. The medicine may increase your risk of getting an infection. Stay away from people who are sick. Tell your doctor or health care professional if you are around anyone with measles or chickenpox. You may need to avoid some vaccines. Talk to your health care provider for more information. If you are going to have surgery, tell your doctor or health care professional that you have taken this medicine within the last twelve months. Ask your doctor or health care professional about your diet. You may need to lower the amount of salt you eat. The medicine can increase your blood sugar. If you are a diabetic check with your doctor if you need help adjusting the dose of your diabetic medicine. What side effects  may I notice from receiving this medicine? Side effects that you should report to your doctor or health care professional as soon as possible: -allergic reactions like skin rash, itching or hives, swelling of the face, lips, or tongue -bloody or tarry stools -changes in vision -eye pain or bulging eyes -fever, sore throat, sneezing, cough, or other signs of infection, wounds that will not heal -increased thirst -irregular heartbeat -muscle cramps -pain in hips, back, ribs, arms,  shoulders, or legs -swelling of the ankles, feet, hands -trouble passing urine or change in the amount of urine -unusual bleeding or bruising -unusually weak or tired -weight gain or weight loss Side effects that usually do not require medical attention (report to your doctor or health care professional if they continue or are bothersome): -changes in emotions or moods -constipation or diarrhea -headache -irritation at site where injected -nausea, vomiting -skin problems, acne, thin and shiny skin -trouble sleeping -unusual hair growth on the face or body This list may not describe all possible side effects. Call your doctor for medical advice about side effects. You may report side effects to FDA at 1-800-FDA-1088. Where should I keep my medicine? This drug is given in a hospital or clinic and will not be stored at home. NOTE: This sheet is a summary. It may not cover all possible information. If you have questions about this medicine, talk to your doctor, pharmacist, or health care provider.  2014, [employer]. (2012-07-17 11:37:56)

## 2014-01-09 NOTE — Progress Notes (Signed)
Tolerated depo medrol im without difficulty

## 2014-01-20 ENCOUNTER — Telehealth: Payer: Self-pay | Admitting: Family Medicine

## 2014-01-20 ENCOUNTER — Telehealth: Payer: Self-pay | Admitting: Gastroenterology

## 2014-01-20 ENCOUNTER — Ambulatory Visit (INDEPENDENT_AMBULATORY_CARE_PROVIDER_SITE_OTHER): Payer: Medicare HMO

## 2014-01-20 ENCOUNTER — Encounter: Payer: Self-pay | Admitting: Family Medicine

## 2014-01-20 ENCOUNTER — Ambulatory Visit (INDEPENDENT_AMBULATORY_CARE_PROVIDER_SITE_OTHER): Payer: Medicare HMO | Admitting: Family Medicine

## 2014-01-20 VITALS — BP 136/81 | HR 61 | Temp 97.7°F | Ht 69.0 in | Wt 198.8 lb

## 2014-01-20 DIAGNOSIS — M5416 Radiculopathy, lumbar region: Secondary | ICD-10-CM

## 2014-01-20 DIAGNOSIS — IMO0002 Reserved for concepts with insufficient information to code with codable children: Secondary | ICD-10-CM

## 2014-01-20 DIAGNOSIS — M5430 Sciatica, unspecified side: Secondary | ICD-10-CM

## 2014-01-20 DIAGNOSIS — M549 Dorsalgia, unspecified: Secondary | ICD-10-CM

## 2014-01-20 DIAGNOSIS — N182 Chronic kidney disease, stage 2 (mild): Secondary | ICD-10-CM

## 2014-01-20 DIAGNOSIS — I10 Essential (primary) hypertension: Secondary | ICD-10-CM

## 2014-01-20 DIAGNOSIS — K219 Gastro-esophageal reflux disease without esophagitis: Secondary | ICD-10-CM

## 2014-01-20 DIAGNOSIS — E785 Hyperlipidemia, unspecified: Secondary | ICD-10-CM

## 2014-01-20 LAB — POCT URINALYSIS DIPSTICK
Bilirubin, UA: NEGATIVE
Blood, UA: NEGATIVE
Glucose, UA: NEGATIVE
Ketones, UA: NEGATIVE
Leukocytes, UA: NEGATIVE
Nitrite, UA: NEGATIVE
Protein, UA: NEGATIVE
Spec Grav, UA: 1.01
Urobilinogen, UA: NEGATIVE
pH, UA: 5

## 2014-01-20 LAB — POCT UA - MICROSCOPIC ONLY
Bacteria, U Microscopic: NEGATIVE
Casts, Ur, LPF, POC: NEGATIVE
Crystals, Ur, HPF, POC: NEGATIVE
Mucus, UA: NEGATIVE
RBC, urine, microscopic: NEGATIVE
WBC, Ur, HPF, POC: NEGATIVE
Yeast, UA: NEGATIVE

## 2014-01-20 MED ORDER — TRAMADOL HCL 50 MG PO TABS: 50.0000 mg | ORAL_TABLET | Freq: Four times a day (QID) | ORAL | Status: DC | PRN

## 2014-01-20 NOTE — Telephone Encounter (Signed)
Free movi prep voucher called to pharmacy and pt is aware

## 2014-01-20 NOTE — Progress Notes (Signed)
Patient ID: Andrew Adams, male   DOB: 1945/12/10, 68 y.o.   MRN: [id number] SUBJECTIVE: CC: Chief Complaint  Patient presents with  . Follow-up    back pain no better, some numbness left hip area. states pain med didnt help     HPI: Still having quite a bit of left lumbar pain with numbness in the left posterior hip area. Medicines didn't help. Cannot bend. The only thing that helped was lying in bed. If he coughs or bends the pain and numbness is worse. Pain radiates to the left buttocks and back of left thigh. Pain is 7/10   Patient is here for follow up of hyperlipidemia/HTN/CKD: denies Headache;denies Chest Pain;;denies Shortness of Breath and orthopnea;denies Visual changes;denies palpitations;denies cough;denies pedal edema;denies symptoms of TIA or stroke;deniesClaudication symptoms. admits to Compliance with medications; denies Problems with medications. Hasn't had labs in 3 months.   Past Medical History  Diagnosis Date  . Hypertension   . GERD (gastroesophageal reflux disease)   . Hyperlipidemia   . Gout   . PE (pulmonary embolism)    Past Surgical History  Procedure Laterality Date  . Abdominal surgery  2103    Smal bowel obstruction   . Abdominal surgery  1981    Perforated large intestine   History   Social History  . Marital Status: Married    Spouse Name: N/A    Number of Children: 1  . Years of Education: N/A   Occupational History  . Finisher    Social History Main Topics  . Smoking status: Former Smoker    Quit date: 02/07/1995  . Smokeless tobacco: Never Used  . Alcohol Use: No  . Drug Use: No  . Sexual Activity: Not on file   Other Topics Concern  . Not on file   Social History Narrative   Lives with wife.     Family History  Problem Relation Age of Onset  . Hypertension Mother   . Diabetes Mother   . Hypertension Father   . Ulcers Father   . Hypertension Sister   . Diabetes Sister   . Diabetes Sister    Current Outpatient  Prescriptions on File Prior to Visit  Medication Sig Dispense Refill  . allopurinol (ZYLOPRIM) 100 MG tablet Take 1 tablet (100 mg total) by mouth daily.  90 tablet  1  . amLODipine (NORVASC) 5 MG tablet Take 1 tablet (5 mg total) by mouth daily.  90 tablet  1  . atorvastatin (LIPITOR) 40 MG tablet Take 1 tablet (40 mg total) by mouth daily.  90 tablet  1  . cyclobenzaprine (FLEXERIL) 5 MG tablet Take 5 mg by mouth 3 (three) times daily.      . fenofibrate 160 MG tablet Take 1 tablet (160 mg total) by mouth daily.  90 tablet  1  . metoprolol (LOPRESSOR) 50 MG tablet Take 1 tablet (50 mg total) by mouth 2 (two) times daily.  180 tablet  1  . pantoprazole (PROTONIX) 40 MG tablet Take 1 tablet (40 mg total) by mouth daily.  90 tablet  1  . MOVIPREP 100 G SOLR Take 1 kit (200 g total) by mouth once.  1 kit  0   No current facility-administered medications on file prior to visit.   No Known Allergies  There is no immunization history on file for this patient. Prior to Admission medications   Medication Sig Start Date End Date Taking? Authorizing Provider  allopurinol (ZYLOPRIM) 100 MG tablet Take 1 tablet (100  mg total) by mouth daily. 09/18/13  Yes Mary-Margaret Hassell Done, FNP  amLODipine (NORVASC) 5 MG tablet Take 1 tablet (5 mg total) by mouth daily. 09/18/13  Yes Mary-Margaret Hassell Done, FNP  atorvastatin (LIPITOR) 40 MG tablet Take 1 tablet (40 mg total) by mouth daily. 09/18/13  Yes Mary-Margaret Hassell Done, FNP  cyclobenzaprine (FLEXERIL) 5 MG tablet Take 5 mg by mouth 3 (three) times daily.   Yes Historical Provider, MD  fenofibrate 160 MG tablet Take 1 tablet (160 mg total) by mouth daily. 09/18/13  Yes Mary-Margaret Hassell Done, FNP  lisinopril-hydrochlorothiazide (PRINZIDE,ZESTORETIC) 20-12.5 MG per tablet  01/09/14  Yes Historical Provider, MD  metoprolol (LOPRESSOR) 50 MG tablet Take 1 tablet (50 mg total) by mouth 2 (two) times daily. 11/22/13  Yes Mary-Margaret Hassell Done, FNP  pantoprazole (PROTONIX)  40 MG tablet Take 1 tablet (40 mg total) by mouth daily. 09/18/13  Yes Mary-Margaret Hassell Done, FNP  traMADol (ULTRAM) 50 MG tablet Take 1 tablet (50 mg total) by mouth every 8 (eight) hours as needed. 01/09/14  Yes Vernie Shanks, MD  MOVIPREP 100 G SOLR Take 1 kit (200 g total) by mouth once. 01/08/14   Milus Banister, MD     ROS: As above in the HPI. All other systems are stable or negative.  OBJECTIVE: APPEARANCE:  Patient in no acute distress.The patient appeared well nourished and normally developed. Acyanotic. Waist: VITAL SIGNS:BP 136/81  Pulse 61  Temp(Src) 97.7 F (36.5 C) (Oral)  Ht '5\' 9"'  (1.753 m)  Wt 198 lb 12.8 oz (90.175 kg)  BMI 29.34 kg/m2 AAM  SKIN: warm and  Dry without overt rashes, tattoos and scars  HEAD and Neck: without JVD, Head and scalp: normal Eyes:No scleral icterus. Fundi normal, eye movements normal. Ears: Auricle normal, canal normal, Tympanic membranes normal, insufflation normal. Nose: normal Throat: normal Neck & thyroid: normal  CHEST & LUNGS: Chest wall: normal Lungs: Clear  CVS: Reveals the PMI to be normally located. Regular rhythm, First and Second Heart sounds are normal,  absence of murmurs, rubs or gallops. Peripheral vasculature: Radial pulses: normal Dorsal pedis pulses: normal Posterior pulses: normal  ABDOMEN:  Appearance: normal Benign, no organomegaly, no masses, no Abdominal Aortic enlargement. No Guarding , no rebound. No Bruits. Bowel sounds: normal  RECTAL: N/A GU: N/A  EXTREMETIES: nonedematous.  MUSCULOSKELETAL:  Spine: reduced ROM.spasm pof the left lumbar muscles. Joints: intact  NEUROLOGIC: oriented to time,place and person; nonfocal. Strength is reduced on the left due to pain Sensory is normal Reflexes  Cranial Nerves are normal. SLR positive.   ASSESSMENT:  Hypertension - Plan: CMP14+EGFR  Hyperlipidemia - Plan: CMP14+EGFR, NMR, lipoprofile  CKD (chronic kidney disease) stage 2, GFR  60-89 ml/min - Plan: POCT UA - Microscopic Only, POCT urinalysis dipstick, CMP14+EGFR  GERD (gastroesophageal reflux disease)  Sciatica  Acute back pain - Plan: DG Lumbar Spine Complete, POCT UA - Microscopic Only, POCT urinalysis dipstick, traMADol (ULTRAM) 50 MG tablet  Lumbar radiculopathy - Plan: DG Lumbar Spine Complete, traMADol (ULTRAM) 50 MG tablet, MR Lumbar Spine Wo Contrast  PLAN:  Orders Placed This Encounter  Procedures  . DG Lumbar Spine Complete    Standing Status: Future     Number of Occurrences: 1     Standing Expiration Date: 03/23/2015    Order Specific Question:  Reason for Exam (SYMPTOM  OR DIAGNOSIS REQUIRED)    Answer:  back strain with radiculopathy    Order Specific Question:  Preferred imaging location?    Answer:  [facility]  .  MR Lumbar Spine Wo Contrast    Standing Status: Future     Number of Occurrences:      Standing Expiration Date: 03/23/2015    Order Specific Question:  Reason for Exam (SYMPTOM  OR DIAGNOSIS REQUIRED)    Answer:  past h/o back injury; L5 compression with back pain and radiculopathy    Order Specific Question:  Preferred imaging location?    Answer:  [facility]    Order Specific Question:  Does the patient have a pacemaker or implanted devices?    Answer:  No  . CMP14+EGFR  . NMR, lipoprofile  . POCT UA - Microscopic Only  . POCT urinalysis dipstick  [facility] reading (PRIMARY) by  Dr. Jacelyn Grip: L5 old compression with DDD and L4 and L5 foramilnal narrowing.                                Meds ordered this encounter  Medications  . lisinopril-hydrochlorothiazide (PRINZIDE,ZESTORETIC) 20-12.5 MG per tablet    Sig:   . traMADol (ULTRAM) 50 MG tablet    Sig: Take 1 tablet (50 mg total) by mouth every 6 (six) hours as needed.    Dispense:  30 tablet    Refill:  0   Medications Discontinued During This Encounter  Medication Reason  . traMADol (ULTRAM) 50 MG tablet Reorder   Return in about 1 week (around 01/27/2014) for  Recheck medical problems.  Virl Coble P. Jacelyn Grip, M.D.

## 2014-01-20 NOTE — Telephone Encounter (Signed)
Pt notified and instructed pt   to pick rx  up at office

## 2014-01-22 LAB — NMR, LIPOPROFILE
Cholesterol: 109 mg/dL (ref <200)
HDL Cholesterol by NMR: 47 mg/dL (ref >=40)
HDL Particle Number: 39.3 umol/L (ref >=30.5)
LDL Particle Number: 938 nmol/L (ref <1000)
LDL Size: 20.8 nm (ref >20.5)
LDLC SERPL CALC-MCNC: 50 mg/dL (ref <100)
LP-IR Score: 50 — ABNORMAL HIGH (ref <=45)
Small LDL Particle Number: 582 nmol/L — ABNORMAL HIGH (ref <=527)
Triglycerides by NMR: 58 mg/dL (ref <150)

## 2014-01-22 LAB — CMP14+EGFR
ALT: 7 IU/L (ref 0–44)
AST: 16 IU/L (ref 0–40)
Albumin/Globulin Ratio: 1.8 (ref 1.1–2.5)
Albumin: 4.5 g/dL (ref 3.5–4.8)
Alkaline Phosphatase: 37 IU/L — ABNORMAL LOW (ref 39–117)
BUN/Creatinine Ratio: 15 (ref 10–22)
BUN: 19 mg/dL (ref 8–27)
CO2: 26 mmol/L (ref 18–29)
Calcium: 9.8 mg/dL (ref 8.6–10.2)
Chloride: 98 mmol/L (ref 97–108)
Creatinine, Ser: 1.24 mg/dL (ref 0.76–1.27)
GFR calc Af Amer: 68 mL/min/{1.73_m2} (ref >59)
GFR calc non Af Amer: 59 mL/min/{1.73_m2} — ABNORMAL LOW (ref >59)
Globulin, Total: 2.5 g/dL (ref 1.5–4.5)
Glucose: 83 mg/dL (ref 65–99)
Potassium: 4.5 mmol/L (ref 3.5–5.2)
Sodium: 140 mmol/L (ref 134–144)
Total Bilirubin: 0.2 mg/dL (ref 0.0–1.2)
Total Protein: 7 g/dL (ref 6.0–8.5)

## 2014-01-24 ENCOUNTER — Ambulatory Visit (HOSPITAL_COMMUNITY)
Admission: RE | Admit: 2014-01-24 | Discharge: 2014-01-24 | Disposition: A | Discharge status: Home / Self Care | Payer: Medicare HMO | Source: Ambulatory Visit | Attending: Family Medicine | Admitting: Family Medicine

## 2014-01-24 ENCOUNTER — Other Ambulatory Visit: Payer: Self-pay | Admitting: Family Medicine

## 2014-01-24 DIAGNOSIS — M5137 Other intervertebral disc degeneration, lumbosacral region: Secondary | ICD-10-CM | POA: Insufficient documentation

## 2014-01-24 DIAGNOSIS — M5126 Other intervertebral disc displacement, lumbar region: Secondary | ICD-10-CM

## 2014-01-24 DIAGNOSIS — M5416 Radiculopathy, lumbar region: Secondary | ICD-10-CM

## 2014-01-24 DIAGNOSIS — M51379 Other intervertebral disc degeneration, lumbosacral region without mention of lumbar back pain or lower extremity pain: Secondary | ICD-10-CM | POA: Insufficient documentation

## 2014-01-24 DIAGNOSIS — M545 Low back pain, unspecified: Secondary | ICD-10-CM | POA: Insufficient documentation

## 2014-01-24 DIAGNOSIS — M5380 Other specified dorsopathies, site unspecified: Secondary | ICD-10-CM | POA: Insufficient documentation

## 2014-01-24 DIAGNOSIS — M48061 Spinal stenosis, lumbar region without neurogenic claudication: Secondary | ICD-10-CM | POA: Insufficient documentation

## 2014-01-24 NOTE — Progress Notes (Signed)
Quick Note:  Call Patient MRI is abnormal: Herniated disc and narrowing where the nerves are.  Recommendations: Needs to see a neurosurgeon. Referred in EPIC.   ______

## 2014-01-27 ENCOUNTER — Ambulatory Visit (INDEPENDENT_AMBULATORY_CARE_PROVIDER_SITE_OTHER): Payer: Medicare HMO | Admitting: Family Medicine

## 2014-01-27 ENCOUNTER — Encounter: Payer: Self-pay | Admitting: Family Medicine

## 2014-01-27 VITALS — BP 160/100 | HR 62 | Temp 97.4°F | Ht 69.0 in | Wt 198.6 lb

## 2014-01-27 DIAGNOSIS — E785 Hyperlipidemia, unspecified: Secondary | ICD-10-CM

## 2014-01-27 DIAGNOSIS — N182 Chronic kidney disease, stage 2 (mild): Secondary | ICD-10-CM

## 2014-01-27 DIAGNOSIS — I10 Essential (primary) hypertension: Secondary | ICD-10-CM

## 2014-01-27 DIAGNOSIS — IMO0002 Reserved for concepts with insufficient information to code with codable children: Secondary | ICD-10-CM

## 2014-01-27 DIAGNOSIS — M5430 Sciatica, unspecified side: Secondary | ICD-10-CM

## 2014-01-27 DIAGNOSIS — K219 Gastro-esophageal reflux disease without esophagitis: Secondary | ICD-10-CM

## 2014-01-27 DIAGNOSIS — I2699 Other pulmonary embolism without acute cor pulmonale: Secondary | ICD-10-CM

## 2014-01-27 DIAGNOSIS — K5900 Constipation, unspecified: Secondary | ICD-10-CM

## 2014-01-27 DIAGNOSIS — M5126 Other intervertebral disc displacement, lumbar region: Secondary | ICD-10-CM

## 2014-01-27 DIAGNOSIS — M5416 Radiculopathy, lumbar region: Secondary | ICD-10-CM

## 2014-01-27 DIAGNOSIS — M549 Dorsalgia, unspecified: Secondary | ICD-10-CM

## 2014-01-27 MED ORDER — TRAMADOL HCL 50 MG PO TABS: 50.0000 mg | ORAL_TABLET | Freq: Four times a day (QID) | ORAL | Status: DC | PRN

## 2014-01-27 MED ORDER — POLYETHYLENE GLYCOL 3350 17 GM/SCOOP PO POWD: ORAL | Status: DC

## 2014-01-27 NOTE — Patient Instructions (Addendum)
See The back surgeon.

## 2014-01-27 NOTE — Progress Notes (Signed)
Patient ID: Andrew Adams, male   DOB: 12-02-1945, 68 y.o.   MRN: [id number] SUBJECTIVE: CC: Chief Complaint  Patient presents with  . Follow-up    1 week follow up back pain states pain still persist left hip area      HPI:  Still having a lot of back pain with radiation to the buttocks and the legs. The steroids and medications helped initially a little bit but it has been severe and has relapsed.  Patient is here for follow up of hyperlipidemia/HTN: denies Headache;denies Chest Pain;denies weakness;denies Shortness of Breath and orthopnea;denies Visual changes;denies palpitations;denies cough;denies pedal edema;denies symptoms of TIA or stroke;deniesClaudication symptoms. admits to Compliance with medications; denies Problems with medications.    Past Medical History  Diagnosis Date  . Hypertension   . GERD (gastroesophageal reflux disease)   . Hyperlipidemia   . Gout   . PE (pulmonary embolism)    Past Surgical History  Procedure Laterality Date  . Abdominal surgery  2103    Smal bowel obstruction   . Abdominal surgery  1981    Perforated large intestine   History   Social History  . Marital Status: Married    Spouse Name: N/A    Number of Children: 1  . Years of Education: N/A   Occupational History  . Finisher    Social History Main Topics  . Smoking status: Former Smoker    Quit date: 02/07/1995  . Smokeless tobacco: Never Used  . Alcohol Use: No  . Drug Use: No  . Sexual Activity: Not on file   Other Topics Concern  . Not on file   Social History Narrative   Lives with wife.     Family History  Problem Relation Age of Onset  . Hypertension Mother   . Diabetes Mother   . Hypertension Father   . Ulcers Father   . Hypertension Sister   . Diabetes Sister   . Diabetes Sister    Current Outpatient Prescriptions on File Prior to Visit  Medication Sig Dispense Refill  . allopurinol (ZYLOPRIM) 100 MG tablet Take 1 tablet (100 mg total) by mouth  daily.  90 tablet  1  . amLODipine (NORVASC) 5 MG tablet Take 1 tablet (5 mg total) by mouth daily.  90 tablet  1  . atorvastatin (LIPITOR) 40 MG tablet Take 1 tablet (40 mg total) by mouth daily.  90 tablet  1  . cyclobenzaprine (FLEXERIL) 5 MG tablet Take 5 mg by mouth 3 (three) times daily.      . fenofibrate 160 MG tablet Take 1 tablet (160 mg total) by mouth daily.  90 tablet  1  . lisinopril-hydrochlorothiazide (PRINZIDE,ZESTORETIC) 20-12.5 MG per tablet       . metoprolol (LOPRESSOR) 50 MG tablet Take 1 tablet (50 mg total) by mouth 2 (two) times daily.  180 tablet  1  . pantoprazole (PROTONIX) 40 MG tablet Take 1 tablet (40 mg total) by mouth daily.  90 tablet  1  . MOVIPREP 100 G SOLR Take 1 kit (200 g total) by mouth once.  1 kit  0   No current facility-administered medications on file prior to visit.   No Known Allergies  There is no immunization history on file for this patient. Prior to Admission medications   Medication Sig Start Date End Date Taking? Authorizing Provider  allopurinol (ZYLOPRIM) 100 MG tablet Take 1 tablet (100 mg total) by mouth daily. 09/18/13  Yes Mary-Margaret Hassell Done, FNP  amLODipine (NORVASC) 5  MG tablet Take 1 tablet (5 mg total) by mouth daily. 09/18/13  Yes Mary-Margaret Hassell Done, FNP  atorvastatin (LIPITOR) 40 MG tablet Take 1 tablet (40 mg total) by mouth daily. 09/18/13  Yes Mary-Margaret Hassell Done, FNP  cyclobenzaprine (FLEXERIL) 5 MG tablet Take 5 mg by mouth 3 (three) times daily.   Yes Historical Provider, MD  fenofibrate 160 MG tablet Take 1 tablet (160 mg total) by mouth daily. 09/18/13  Yes Mary-Margaret Hassell Done, FNP  lisinopril-hydrochlorothiazide (PRINZIDE,ZESTORETIC) 20-12.5 MG per tablet  01/09/14  Yes Historical Provider, MD  metoprolol (LOPRESSOR) 50 MG tablet Take 1 tablet (50 mg total) by mouth 2 (two) times daily. 11/22/13  Yes Mary-Margaret Hassell Done, FNP  pantoprazole (PROTONIX) 40 MG tablet Take 1 tablet (40 mg total) by mouth daily. 09/18/13   Yes Mary-Margaret Hassell Done, FNP  traMADol (ULTRAM) 50 MG tablet Take 1 tablet (50 mg total) by mouth every 6 (six) hours as needed. 01/20/14  Yes Vernie Shanks, MD  MOVIPREP 100 G SOLR Take 1 kit (200 g total) by mouth once. 01/08/14   Milus Banister, MD     ROS: As above in the HPI. All other systems are stable or negative.  OBJECTIVE: APPEARANCE:  Patient in no acute distress.The patient appeared well nourished and normally developed. Acyanotic. Waist: VITAL SIGNS:BP 160/100  Pulse 62  Temp(Src) 97.4 F (36.3 C) (Oral)  Ht '5\' 9"'  (1.753 m)  Wt 198 lb 9.6 oz (90.084 kg)  BMI 29.31 kg/m2 AAM  SKIN: warm and  Dry without overt rashes, tattoos and scars  HEAD and Neck: without JVD, Head and scalp: normal Eyes:No scleral icterus. Fundi normal, eye movements normal. Ears: Auricle normal, canal normal, Tympanic membranes normal, insufflation normal. Nose: normal Throat: normal Neck & thyroid: normal  CHEST & LUNGS: Chest wall: normal Lungs: Clear  CVS: Reveals the PMI to be normally located. Regular rhythm, First and Second Heart sounds are normal,  absence of murmurs, rubs or gallops. Peripheral vasculature: Radial pulses: normal Dorsal pedis pulses: normal Posterior pulses: normal  ABDOMEN:  Appearance: normal Benign, no organomegaly, no masses, no Abdominal Aortic enlargement. No Guarding , no rebound. No Bruits. Bowel sounds: normal  RECTAL: N/A GU: N/A  EXTREMETIES: nonedematous.  MUSCULOSKELETAL:  Spine: reduced ROM. Back is held stiffly. Joints: intact  NEUROLOGIC: oriented to time,place and person; nonfocal. Strength is reduced in the lower extremities from the pain. SLR positive bilaterally. Sensory is normal Reflexes are reduced Cranial Nerves are normal.   MRI reviewed in EPIC.  ASSESSMENT:  Lumbar disc herniation  Sciatica  Hypertension  Hyperlipidemia  CKD (chronic kidney disease) stage 2, GFR 60-89 ml/min  GERD (gastroesophageal  reflux disease)  Pulmonary emboli  Acute back pain - Plan: traMADol (ULTRAM) 50 MG tablet  Lumbar radiculopathy - Plan: traMADol (ULTRAM) 50 MG tablet  Unspecified constipation - Plan: polyethylene glycol powder (GLYCOLAX/MIRALAX) powder  PLAN: Discussed MRI with patient and need to see a neurosurgeon Dr Cyndy Freeze, his previous Neurosurgeon.  No orders of the defined types were placed in this encounter.   Meds ordered this encounter  Medications  . traMADol (ULTRAM) 50 MG tablet    Sig: Take 1 tablet (50 mg total) by mouth every 6 (six) hours as needed.    Dispense:  40 tablet    Refill:  0  . polyethylene glycol powder (GLYCOLAX/MIRALAX) powder    Sig: One capful po daily prn constipation    Dispense:  850 g    Refill:  0   Medications Discontinued  During This Encounter  Medication Reason  . traMADol (ULTRAM) 50 MG tablet Reorder   Return in about 4 weeks (around 02/24/2014) for recheck BP.  Cristhian Vanhook P. Jacelyn Grip, M.D.

## 2014-02-07 ENCOUNTER — Other Ambulatory Visit: Payer: Self-pay | Admitting: Nurse Practitioner

## 2014-02-12 ENCOUNTER — Ambulatory Visit (AMBULATORY_SURGERY_CENTER): Payer: Medicare HMO | Admitting: Gastroenterology

## 2014-02-12 ENCOUNTER — Encounter: Payer: Self-pay | Admitting: Gastroenterology

## 2014-02-12 VITALS — BP 176/99 | HR 58 | Temp 97.0°F | Resp 17 | Ht 69.0 in | Wt 204.0 lb

## 2014-02-12 DIAGNOSIS — D126 Benign neoplasm of colon, unspecified: Secondary | ICD-10-CM

## 2014-02-12 DIAGNOSIS — Z1211 Encounter for screening for malignant neoplasm of colon: Secondary | ICD-10-CM

## 2014-02-12 DIAGNOSIS — K625 Hemorrhage of anus and rectum: Secondary | ICD-10-CM

## 2014-02-12 MED ORDER — SODIUM CHLORIDE 0.9 % IV SOLN: 500.0000 mL | INTRAVENOUS | Status: DC

## 2014-02-12 NOTE — Patient Instructions (Signed)
Discharge instructions given with verbal understanding. Handouts on polyps,diverticulosis and hemorrhoids. Resume previous medications. YOU HAD AN ENDOSCOPIC PROCEDURE TODAY AT THE [facility]: Refer to the procedure report that was given to you for any specific questions about what was found during the examination.  If the procedure report does not answer your questions, please call your gastroenterologist to clarify.  If you requested that your care partner not be given the details of your procedure findings, then the procedure report has been included in a sealed envelope for you to review at your convenience later.  YOU SHOULD EXPECT: Some feelings of bloating in the abdomen. Passage of more gas than usual.  Walking can help get rid of the air that was put into your GI tract during the procedure and reduce the bloating. If you had a lower endoscopy (such as a colonoscopy or flexible sigmoidoscopy) you may notice spotting of blood in your stool or on the toilet paper. If you underwent a bowel prep for your procedure, then you may not have a normal bowel movement for a few days.  DIET: Your first meal following the procedure should be a light meal and then it is ok to progress to your normal diet.  A half-sandwich or bowl of soup is an example of a good first meal.  Heavy or fried foods are harder to digest and may make you feel nauseous or bloated.  Likewise meals heavy in dairy and vegetables can cause extra gas to form and this can also increase the bloating.  Drink plenty of fluids but you should avoid alcoholic beverages for 24 hours.  ACTIVITY: Your care partner should take you home directly after the procedure.  You should plan to take it easy, moving slowly for the rest of the day.  You can resume normal activity the day after the procedure however you should NOT DRIVE or use heavy machinery for 24 hours (because of the sedation medicines used during the test).    SYMPTOMS TO REPORT  IMMEDIATELY: A gastroenterologist can be reached at any hour.  During normal business hours, 8:30 AM to 5:00 PM Monday through Friday, call [phone].  After hours and on weekends, please call the GI answering service at [phone] who will take a message and have the physician on call contact you.   Following lower endoscopy (colonoscopy or flexible sigmoidoscopy):  Excessive amounts of blood in the stool  Significant tenderness or worsening of abdominal pains  Swelling of the abdomen that is new, acute  Fever of 100F or higher  FOLLOW UP: If any biopsies were taken you will be contacted by phone or by letter within the next 1-3 weeks.  Call your gastroenterologist if you have not heard about the biopsies in 3 weeks.  Our staff will call the home number listed on your records the next business day following your procedure to check on you and address any questions or concerns that you may have at that time regarding the information given to you following your procedure. This is a courtesy call and so if there is no answer at the home number and we have not heard from you through the emergency physician on call, we will assume that you have returned to your regular daily activities without incident.  SIGNATURES/CONFIDENTIALITY: You and/or your care partner have signed paperwork which will be entered into your electronic medical record.  These signatures attest to the fact that that the information above on your After Visit Summary   has been reviewed and is understood.  Full responsibility of the confidentiality of this discharge information lies with you and/or your care-partner. 

## 2014-02-12 NOTE — Op Note (Signed)
[place]  [address] [place], [id number]   COLONOSCOPY PROCEDURE REPORT  PATIENT: Andrew Adams, Andrew Adams  MR#: [id number] BIRTHDATE: 1946/10/28 , 61  yrs. old GENDER: Male ENDOSCOPIST: Milus Banister, MD REFERRED [id number] Laurance Flatten, M.D. PROCEDURE DATE:  02/12/2014 PROCEDURE:   Colonoscopy with snare polypectomy First Screening Colonoscopy - Avg.  risk and is 50 yrs.  old or older - No.  Prior Negative Screening - Now for repeat screening. 10 or more years since last screening  History of Adenoma - Now for follow-up colonoscopy & has been > or = to 3 yrs.  N/A  Polyps Removed Today? Yes. ASA CLASS:   Class II INDICATIONS:average risk screening. MEDICATIONS: MAC sedation, administered by CRNA and propofol (Diprivan) 150mg  IV  DESCRIPTION OF PROCEDURE:   After the risks benefits and alternatives of the procedure were thoroughly explained, informed consent was obtained.  A digital rectal exam revealed no abnormalities of the rectum.   The [id number]  endoscope was introduced through the anus and advanced to the cecum, which was identified by both the appendix and ileocecal valve. No adverse events experienced.   The quality of the prep was excellent.  The instrument was then slowly withdrawn as the colon was fully examined.  COLON FINDINGS: One polyp was found, removed and sent to pathology. This was 24mm, sessile, located in cecum, removed with cold snare. 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  The examination was otherwise normal.  Retroflexed views revealed no abnormalities. There were small external hemorrhoids. The time to cecum=3 minutes 05 seconds.  Withdrawal time=10 minutes 02 seconds. The scope was withdrawn and the procedure completed. COMPLICATIONS: There were no complications.  ENDOSCOPIC IMPRESSION: One polyp was found, removed and sent to pathology. 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 There were small  external hemorrhoids. The examination was otherwise normal.  RECOMMENDATIONS: If the polyp(s) removed today are proven to be adenomatous (pre-cancerous) polyps, you will need a repeat colonoscopy in 5 years.  Otherwise you should continue to follow colorectal cancer screening guidelines for "routine risk" patients with colonoscopy in 10 years.  You will receive a letter within 1-2 weeks with the results of your biopsy as well as final recommendations.  Please call my office if you have not received a letter after 3 weeks.   eSigned:  Milus Banister, MD 02/12/2014 2:36 PM

## 2014-02-12 NOTE — Progress Notes (Signed)
Lidocaine-40mg IV prior to Propofol InductionPropofol given over incremental dosages 

## 2014-02-12 NOTE — Progress Notes (Signed)
Called to room to assist during endoscopic procedure.  Patient ID and intended procedure confirmed with present staff. Received instructions for my participation in the procedure from the performing physician.  

## 2014-02-13 ENCOUNTER — Telehealth: Payer: Self-pay

## 2014-02-13 NOTE — Telephone Encounter (Signed)
  Follow up Call-  Call back number 02/12/2014  Post procedure Call Back phone  # [phone]  Permission to leave phone message Yes     Patient questions:  Do you have a fever, pain , or abdominal swelling? no Pain Score  0 *  Have you tolerated food without any problems? yes  Have you been able to return to your normal activities? yes  Do you have any questions about your discharge instructions: Diet   no Medications  no Follow up visit  no  Do you have questions or concerns about your Care? no  Actions: * If pain score is 4 or above: No action needed, pain <4.   No problems per the pt. maw

## 2014-02-17 ENCOUNTER — Encounter: Payer: Self-pay | Admitting: Gastroenterology

## 2014-02-24 ENCOUNTER — Ambulatory Visit (INDEPENDENT_AMBULATORY_CARE_PROVIDER_SITE_OTHER): Payer: Medicare HMO

## 2014-02-24 ENCOUNTER — Encounter: Payer: Self-pay | Admitting: Nurse Practitioner

## 2014-02-24 ENCOUNTER — Ambulatory Visit (INDEPENDENT_AMBULATORY_CARE_PROVIDER_SITE_OTHER): Payer: Medicare HMO | Admitting: Nurse Practitioner

## 2014-02-24 VITALS — BP 133/81 | HR 62 | Temp 97.3°F | Ht 69.0 in | Wt 196.0 lb

## 2014-02-24 DIAGNOSIS — J449 Chronic obstructive pulmonary disease, unspecified: Secondary | ICD-10-CM

## 2014-02-24 DIAGNOSIS — I10 Essential (primary) hypertension: Secondary | ICD-10-CM

## 2014-02-24 DIAGNOSIS — E785 Hyperlipidemia, unspecified: Secondary | ICD-10-CM

## 2014-02-24 DIAGNOSIS — M109 Gout, unspecified: Secondary | ICD-10-CM

## 2014-02-24 DIAGNOSIS — N182 Chronic kidney disease, stage 2 (mild): Secondary | ICD-10-CM

## 2014-02-24 DIAGNOSIS — K219 Gastro-esophageal reflux disease without esophagitis: Secondary | ICD-10-CM

## 2014-02-24 MED ORDER — ATORVASTATIN CALCIUM 40 MG PO TABS: 40.0000 mg | ORAL_TABLET | Freq: Every day | ORAL | Status: AC

## 2014-02-24 MED ORDER — FENOFIBRATE 160 MG PO TABS: 160.0000 mg | ORAL_TABLET | Freq: Every day | ORAL | Status: DC

## 2014-02-24 MED ORDER — ALLOPURINOL 100 MG PO TABS: 100.0000 mg | ORAL_TABLET | Freq: Every day | ORAL | Status: AC

## 2014-02-24 MED ORDER — METOPROLOL TARTRATE 50 MG PO TABS: 50.0000 mg | ORAL_TABLET | Freq: Two times a day (BID) | ORAL | Status: DC

## 2014-02-24 MED ORDER — PANTOPRAZOLE SODIUM 40 MG PO TBEC: 40.0000 mg | DELAYED_RELEASE_TABLET | Freq: Every day | ORAL | Status: DC

## 2014-02-24 MED ORDER — FLUTICASONE-SALMETEROL 100-50 MCG/DOSE IN AEPB: 1.0000 | INHALATION_SPRAY | Freq: Two times a day (BID) | RESPIRATORY_TRACT | Status: DC

## 2014-02-24 NOTE — Patient Instructions (Signed)
Chronic Obstructive Pulmonary Disease  Chronic obstructive pulmonary disease (COPD) is a common lung condition in which airflow from the lungs is limited. COPD is a general term that can be used to describe many different lung problems that limit airflow, including both chronic bronchitis and emphysema.  If you have COPD, your lung function will probably never return to normal, but there are measures you can take to improve lung function and make yourself feel better.   CAUSES   · Smoking (common).    · Exposure to secondhand smoke.    · Genetic problems.  · Chronic inflammatory lung diseases or recurrent infections.  SYMPTOMS   · Shortness of breath, especially with physical activity.    · Deep, persistent (chronic) cough with a large amount of thick mucus.    · Wheezing.    · Rapid breaths (tachypnea).    · Gray or bluish discoloration (cyanosis) of the skin, especially in fingers, toes, or lips.    · Fatigue.    · Weight loss.    · Frequent infections or episodes when breathing symptoms become much worse (exacerbations).    · Chest tightness.  DIAGNOSIS   Your healthcare provider will take a medical history and perform a physical examination to make the initial diagnosis.  Additional tests for COPD may include:   · Lung (pulmonary) function tests.  · Chest X-ray.  · CT scan.  · Blood tests.  TREATMENT   Treatment available to help you feel better when you have COPD include:   · Inhaler and nebulizer medicines. These help manage the symptoms of COPD and make your breathing more comfortable  · Supplemental oxygen. Supplemental oxygen is only helpful if you have a low oxygen level in your blood.    · Exercise and physical activity. These are beneficial for nearly all people with COPD. Some people may also benefit from a pulmonary rehabilitation program.  HOME CARE INSTRUCTIONS   · Take all medicines (inhaled or pills) as directed by your health care provider.  · Only take over-the-counter or prescription medicines  for pain, fever, or discomfort as directed by your health care provider.    · Avoid over-the-counter medicines or cough syrups that dry up your airway (such as antihistamines) and slow down the elimination of secretions unless instructed otherwise by your healthcare provider.    · If you are a smoker, the most important thing that you can do is stop smoking. Continuing to smoke will cause further lung damage and breathing trouble. Ask your health care provider for help with quitting smoking. He or she can direct you to community resources or hospitals that provide support.  · Avoid exposure to irritants such as smoke, chemicals, and fumes that aggravate your breathing.  · Use oxygen therapy and pulmonary rehabilitation if directed by your health care provider. If you require home oxygen therapy, ask your healthcare provider whether you should purchase a pulse oximeter to measure your oxygen level at home.    · Avoid contact with individuals who have a contagious illness.  · Avoid extreme temperature and humidity changes.  · Eat healthy foods. Eating smaller, more frequent meals and resting before meals may help you maintain your strength.  · Stay active, but balance activity with periods of rest. Exercise and physical activity will help you maintain your ability to do things you want to do.  · Preventing infection and hospitalization is very important when you have COPD. Make sure to receive all the vaccines your health care provider recommends, especially the pneumococcal and influenza vaccines. Ask your healthcare provider whether you   need a pneumonia vaccine.  · Learn and use relaxation techniques to manage stress.  · Learn and use controlled breathing techniques as directed by your health care provider. Controlled breathing techniques include:    · Pursed lip breathing. Start by breathing in (inhaling) through your nose for 1 second. Then, purse your lips as if you were going to whistle and breathe out (exhale)  through the pursed lips for 2 seconds.    · Diaphragmatic breathing. Start by putting one hand on your abdomen just above your waist. Inhale slowly through your nose. The hand on your abdomen should move out. Then purse your lips and exhale slowly. You should be able to feel the hand on your abdomen moving in as you exhale.    · Learn and use controlled coughing to clear mucus from your lungs. Controlled coughing is a series of short, progressive coughs. The steps of controlled coughing are:    1. Lean your head slightly forward.    2. Breathe in deeply using diaphragmatic breathing.    3. Try to hold your breath for 3 seconds.    4. Keep your mouth slightly open while coughing twice.    5. Spit any mucus out into a tissue.    6. Rest and repeat the steps once or twice as needed.  SEEK MEDICAL CARE IF:   · You are coughing up more mucus than usual.    · There is a change in the color or thickness of your mucus.    · Your breathing is more labored than usual.    · Your breathing is faster than usual.    SEEK IMMEDIATE MEDICAL CARE IF:   · You have shortness of breath while you are resting.    · You have shortness of breath that prevents you from:  · Being able to talk.    · Performing your usual physical activities.    · You have chest pain lasting longer than 5 minutes.    · Your skin color is more cyanotic than usual.  · You measure low oxygen saturations for longer than 5 minutes with a pulse oximeter.  MAKE SURE YOU:   · Understand these instructions.  · Will watch your condition.  · Will get help right away if you are not doing well or get worse.  Document Released: 07/27/2005 Document Revised: 08/07/2013 Document Reviewed: 06/13/2013  [employer]® Patient Information ©2014 [employer].

## 2014-02-24 NOTE — Progress Notes (Signed)
Subjective:    Patient ID: Andrew Adams, male    DOB: 1945-11-09, 68 y.o.   MRN: [id number]  Patient here today for follow up of chronic medical problems.  Cough Pertinent negatives include no chills, fever, heartburn, myalgias, postnasal drip or shortness of breath.  Hyperlipidemia This is a chronic problem. The current episode started more than 1 year ago. The problem is uncontrolled. Recent lipid tests were reviewed and are high. He has no history of hypothyroidism. Pertinent negatives include no leg pain, myalgias or shortness of breath. Current antihyperlipidemic treatment includes statins. The current treatment provides mild improvement of lipids. Risk factors for coronary artery disease include dyslipidemia, hypertension and male sex.  Hypertension This is a chronic problem. The current episode started more than 1 year ago. The problem has been gradually worsening since onset. The problem is uncontrolled. Pertinent negatives include no anxiety, blurred vision, neck pain, palpitations or shortness of breath. Risk factors for coronary artery disease include dyslipidemia and male gender. Past treatments include ACE inhibitors, diuretics and beta blockers. The current treatment provides no improvement.  Gastrophageal Reflux He reports no heartburn. Cough: dry. This is a chronic problem. The current episode started more than 1 year ago. The problem occurs rarely. The problem has been resolved. He has tried a PPI for the symptoms. The treatment provided significant relief.       * Review of Systems  Constitutional: Negative for fever and chills.  HENT: Negative for congestion and postnasal drip.   Eyes: Negative for blurred vision.  Respiratory: Negative for shortness of breath. Cough: dry.   Cardiovascular: Negative for palpitations.  Gastrointestinal: Negative.  Negative for heartburn.  Musculoskeletal: Positive for joint swelling (rigth thumb). Negative for myalgias and neck pain.   Neurological: Negative.   Psychiatric/Behavioral: Negative.   All other systems reviewed and are negative.      Objective:   Physical Exam  Vitals reviewed. Constitutional: He is oriented to person, place, and time. He appears well-developed and well-nourished.  HENT:  Head: Normocephalic.  Right Ear: External ear normal.  Left Ear: External ear normal.  Eyes: Pupils are equal, round, and reactive to light.  Neck: Neck supple. No thyromegaly present.  Cardiovascular: Normal rate, regular rhythm, normal heart sounds and intact distal pulses.   Pulmonary/Chest: Effort normal. He has wheezes (tight exp wheezes throughout lung fields).  Dry cough  Abdominal: Soft. Bowel sounds are normal. There is no tenderness.  Incisional hernia upper abdominal wall  Genitourinary:  Refuses prostate exam  Musculoskeletal: Normal range of motion. He exhibits no edema.  Neurological: He is alert and oriented to person, place, and time.  Skin: Skin is warm and dry.  Psychiatric: He has a normal mood and affect. His behavior is normal. Judgment and thought content normal.   BP 133/81  Pulse 62  Temp(Src) 97.3 F (36.3 C) (Oral)  Ht '5\' 9"'  (1.753 m)  Wt 196 lb (88.905 kg)  BMI 28.93 kg/m2 Chest x ray - chronic bronchitic changes-Preliminary reading by Ronnald Collum, FNP  [facility]    pre/post spirometry- % not readable- fainter wheezes after treatment  Assessment & Plan:   1. Hypertension   2. Hyperlipidemia   3. GERD (gastroesophageal reflux disease)   4. CKD (chronic kidney disease) stage 2, GFR 60-89 ml/min   5. Gout   6. 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 DG  Chest 2 View    Standing Status: Future     Number of Occurrences: 1     Standing Expiration Date: 04/26/2015    Order Specific Question:  Reason for Exam (SYMPTOM  OR DIAGNOSIS REQUIRED)    Answer:  cough     Order Specific Question:  Preferred imaging location?    Answer:  Internal  . CMP14+EGFR  . NMR, lipoprofile  . PR EVAL OF BRONCHOSPASM   Meds ordered this encounter  Medications  . pantoprazole (PROTONIX) 40 MG tablet    Sig: Take 1 tablet (40 mg total) by mouth daily.    Dispense:  90 tablet    Refill:  1    Order Specific Question:  Supervising Provider    Answer:  Chipper Herb [[id number]]  . allopurinol (ZYLOPRIM) 100 MG tablet    Sig: Take 1 tablet (100 mg total) by mouth daily.    Dispense:  90 tablet    Refill:  1    Order Specific Question:  Supervising Provider    Answer:  Chipper Herb [[id number]]  . atorvastatin (LIPITOR) 40 MG tablet    Sig: Take 1 tablet (40 mg total) by mouth daily.    Dispense:  90 tablet    Refill:  1    Order Specific Question:  Supervising Provider    Answer:  Chipper Herb [[id number]]  . fenofibrate 160 MG tablet    Sig: Take 1 tablet (160 mg total) by mouth daily.    Dispense:  90 tablet    Refill:  1    Order Specific Question:  Supervising Provider    Answer:  Chipper Herb [[id number]]  . metoprolol (LOPRESSOR) 50 MG tablet    Sig: Take 1 tablet (50 mg total) by mouth 2 (two) times daily.    Dispense:  180 tablet    Refill:  1    Order Specific Question:  Supervising Provider    Answer:  Chipper Herb [[id number]]  . Fluticasone-Salmeterol (ADVAIR) 100-50 MCG/DOSE AEPB    Sig: Inhale 1 puff into the lungs 2 (two) times daily.    Dispense:  1 each    Refill:  0    Order Specific Question:  Supervising Provider    Answer:  Joycelyn Man   Added advair to meds- rinse moth out after use Labs pending Health maintenance reviewed Diet and exercise encouraged Continue all meds Follow up  In 1 month- recheck cough an dbreathing   Mary-Margaret Hassell Done, FNP

## 2014-02-25 LAB — CMP14+EGFR
ALBUMIN: 4.5 g/dL (ref 3.5–4.8)
ALT: 12 IU/L (ref 0–44)
AST: 21 IU/L (ref 0–40)
Albumin/Globulin Ratio: 1.8 (ref 1.1–2.5)
Alkaline Phosphatase: 39 IU/L (ref 39–117)
BILIRUBIN TOTAL: 0.4 mg/dL (ref 0.0–1.2)
BUN/Creatinine Ratio: 8 — ABNORMAL LOW (ref 10–22)
BUN: 10 mg/dL (ref 8–27)
CALCIUM: 9.3 mg/dL (ref 8.6–10.2)
CO2: 23 mmol/L (ref 18–29)
Chloride: 98 mmol/L (ref 97–108)
Creatinine, Ser: 1.22 mg/dL (ref 0.76–1.27)
GFR calc Af Amer: 69 mL/min/{1.73_m2} (ref >59)
GFR, EST NON AFRICAN AMERICAN: 60 mL/min/{1.73_m2} (ref >59)
GLUCOSE: 116 mg/dL — AB (ref 65–99)
Globulin, Total: 2.5 g/dL (ref 1.5–4.5)
Potassium: 4.1 mmol/L (ref 3.5–5.2)
Sodium: 139 mmol/L (ref 134–144)
TOTAL PROTEIN: 7 g/dL (ref 6.0–8.5)

## 2014-02-25 LAB — NMR, LIPOPROFILE
CHOLESTEROL: 150 mg/dL (ref <200)
HDL CHOLESTEROL BY NMR: 45 mg/dL (ref >=40)
HDL PARTICLE NUMBER: 39.8 umol/L (ref >=30.5)
LDL Particle Number: 1109 nmol/L — ABNORMAL HIGH (ref <1000)
LDL SIZE: 19.6 nm (ref >20.5)
LDLC SERPL CALC-MCNC: 79 mg/dL (ref <100)
LP-IR Score: 77 — ABNORMAL HIGH (ref <=45)
SMALL LDL PARTICLE NUMBER: 602 nmol/L — AB (ref <=527)
TRIGLYCERIDES BY NMR: 130 mg/dL (ref <150)

## 2014-02-26 ENCOUNTER — Telehealth: Payer: Self-pay | Admitting: Family Medicine

## 2014-02-26 NOTE — Telephone Encounter (Signed)
Message copied by Waverly Ferrari on Wed Feb 26, 2014 10:12 AM ------      Message from: Chevis Pretty      Created: Tue Feb 25, 2014  5:12 PM       Kidney and liver function stable- blood sugar mildly elevated- watch carbs in diet      Cholesterol looks great      Continue current meds- low fat diet and exercise and recheck in 3 months       ------

## 2014-03-03 NOTE — Telephone Encounter (Signed)
Patient aware.

## 2014-03-11 ENCOUNTER — Telehealth: Payer: Self-pay | Admitting: Nurse Practitioner

## 2014-03-11 NOTE — Telephone Encounter (Signed)
Message copied by Cline Crock on Tue Mar 11, 2014 12:20 PM ------      Message from: Chevis Pretty      Created: Tue Feb 25, 2014  5:12 PM       Kidney and liver function stable- blood sugar mildly elevated- watch carbs in diet      Cholesterol looks great      Continue current meds- low fat diet and exercise and recheck in 3 months       ------

## 2014-03-26 ENCOUNTER — Encounter: Payer: Self-pay | Admitting: Nurse Practitioner

## 2014-03-26 ENCOUNTER — Ambulatory Visit (INDEPENDENT_AMBULATORY_CARE_PROVIDER_SITE_OTHER): Payer: Medicare HMO | Admitting: Nurse Practitioner

## 2014-03-26 VITALS — BP 156/91 | HR 62 | Temp 99.6°F | Ht 69.0 in | Wt 200.0 lb

## 2014-03-26 DIAGNOSIS — N182 Chronic kidney disease, stage 2 (mild): Secondary | ICD-10-CM

## 2014-03-26 DIAGNOSIS — R002 Palpitations: Secondary | ICD-10-CM

## 2014-03-26 DIAGNOSIS — K219 Gastro-esophageal reflux disease without esophagitis: Secondary | ICD-10-CM

## 2014-03-26 DIAGNOSIS — M5430 Sciatica, unspecified side: Secondary | ICD-10-CM

## 2014-03-26 DIAGNOSIS — I10 Essential (primary) hypertension: Secondary | ICD-10-CM

## 2014-03-26 DIAGNOSIS — E785 Hyperlipidemia, unspecified: Secondary | ICD-10-CM

## 2014-03-26 DIAGNOSIS — G589 Mononeuropathy, unspecified: Secondary | ICD-10-CM

## 2014-03-26 NOTE — Progress Notes (Signed)
Subjective:    Patient ID: Andrew Adams, male    DOB: November 15, 1945, 68 y.o.   MRN: [id number]  Patient here today for follow up of chronic medical problems. He had a cough the last time he was here and was given albuterol at last visit- Cough is completely gone and he has not needed inhaler.  Hyperlipidemia This is a chronic problem. The current episode started more than 1 year ago. The problem is uncontrolled. Recent lipid tests were reviewed and are high. He has no history of hypothyroidism. Pertinent negatives include no leg pain. Current antihyperlipidemic treatment includes statins. The current treatment provides mild improvement of lipids. Risk factors for coronary artery disease include dyslipidemia, hypertension and male sex.  Hypertension This is a chronic problem. The current episode started more than 1 year ago. The problem has been gradually worsening since onset. The problem is uncontrolled. Pertinent negatives include no anxiety, blurred vision, neck pain or palpitations. Risk factors for coronary artery disease include dyslipidemia and male gender. Past treatments include ACE inhibitors, diuretics and beta blockers. The current treatment provides no improvement.  Gastrophageal Reflux This is a chronic problem. The current episode started more than 1 year ago. The problem occurs rarely. The problem has been resolved. He has tried a PPI for the symptoms. The treatment provided significant relief.   * diagnosed with sciatica at last visit and has been seeing back specialist and has had to injections in his back which have helped him a lot.    * Review of Systems  HENT: Negative for congestion.   Eyes: Negative for blurred vision.  Cardiovascular: Negative for palpitations.  Gastrointestinal: Negative.   Musculoskeletal: Positive for joint swelling (rigth thumb). Negative for neck pain.  Neurological: Negative.   Psychiatric/Behavioral: Negative.   All other systems reviewed and  are negative.      Objective:   Physical Exam  Vitals reviewed. Constitutional: He is oriented to person, place, and time. He appears well-developed and well-nourished.  HENT:  Head: Normocephalic.  Right Ear: External ear normal.  Left Ear: External ear normal.  Eyes: Pupils are equal, round, and reactive to light.  Neck: Neck supple. No thyromegaly present.  Cardiovascular: Normal rate, regular rhythm, normal heart sounds and intact distal pulses.   Pulmonary/Chest: Effort normal.  Abdominal: Soft. Bowel sounds are normal. There is no tenderness.  Incisional hernia upper abdominal wall  Genitourinary:  Refuses prostate exam  Musculoskeletal: Normal range of motion. He exhibits no edema.  Neurological: He is alert and oriented to person, place, and time.  Skin: Skin is warm and dry.  Psychiatric: He has a normal mood and affect. His behavior is normal. Judgment and thought content normal.   BP 156/91  Pulse 62  Temp(Src) 99.6 F (37.6 C) (Oral)  Ht 5\' 9"  (1.753 m)  Wt 200 lb (90.719 kg)  BMI 29.52 kg/m2  Assessment & Plan:   1. Pinched nerve   2. Sciatica   3. Palpitation   4. Hypertension   5. Hyperlipidemia   6. GERD (gastroesophageal reflux disease)   7. CKD (chronic kidney disease) stage 2, GFR 60-89 ml/min    Orders Placed This Encounter  Procedures  . Ambulatory referral to Neurosurgery    Referral Priority:  Routine    Referral Type:  Surgical    Referral Reason:  Specialty Services Required    Requested Specialty:  Neurosurgery    Number of Visits Requested:  1    Labs not due currently Health maintenance reviewed  Diet and exercise encouraged Continue all meds Follow up  In 3 months   [place], FNP

## 2014-04-08 NOTE — Addendum Note (Signed)
Addended by: Ilean China on: 04/08/2014 11:52 AM   Modules accepted: Orders

## 2014-05-26 ENCOUNTER — Telehealth: Payer: Self-pay | Admitting: Nurse Practitioner

## 2014-05-27 MED ORDER — AMLODIPINE BESYLATE 5 MG PO TABS: ORAL_TABLET | ORAL | Status: DC

## 2014-05-27 NOTE — Telephone Encounter (Signed)
done

## 2014-07-09 ENCOUNTER — Ambulatory Visit: Payer: Medicare HMO | Admitting: Nurse Practitioner

## 2014-10-10 ENCOUNTER — Ambulatory Visit: Payer: Medicare HMO | Admitting: Nurse Practitioner

## 2015-02-05 DIAGNOSIS — E782 Mixed hyperlipidemia: Secondary | ICD-10-CM | POA: Diagnosis not present

## 2015-02-05 DIAGNOSIS — K219 Gastro-esophageal reflux disease without esophagitis: Secondary | ICD-10-CM | POA: Diagnosis not present

## 2015-02-05 DIAGNOSIS — I10 Essential (primary) hypertension: Secondary | ICD-10-CM | POA: Diagnosis not present

## 2015-02-05 DIAGNOSIS — M1000 Idiopathic gout, unspecified site: Secondary | ICD-10-CM | POA: Diagnosis not present

## 2015-05-28 DIAGNOSIS — I10 Essential (primary) hypertension: Secondary | ICD-10-CM | POA: Diagnosis not present

## 2015-05-28 DIAGNOSIS — E1069 Type 1 diabetes mellitus with other specified complication: Secondary | ICD-10-CM | POA: Diagnosis not present

## 2015-05-28 DIAGNOSIS — E782 Mixed hyperlipidemia: Secondary | ICD-10-CM | POA: Diagnosis not present

## 2015-05-28 DIAGNOSIS — K219 Gastro-esophageal reflux disease without esophagitis: Secondary | ICD-10-CM | POA: Diagnosis not present

## 2015-06-04 DIAGNOSIS — I10 Essential (primary) hypertension: Secondary | ICD-10-CM | POA: Diagnosis not present

## 2015-06-04 DIAGNOSIS — M1000 Idiopathic gout, unspecified site: Secondary | ICD-10-CM | POA: Diagnosis not present

## 2015-06-04 DIAGNOSIS — R0600 Dyspnea, unspecified: Secondary | ICD-10-CM | POA: Diagnosis not present

## 2015-06-04 DIAGNOSIS — K219 Gastro-esophageal reflux disease without esophagitis: Secondary | ICD-10-CM | POA: Diagnosis not present

## 2015-06-04 DIAGNOSIS — E782 Mixed hyperlipidemia: Secondary | ICD-10-CM | POA: Diagnosis not present

## 2015-09-16 DIAGNOSIS — M5432 Sciatica, left side: Secondary | ICD-10-CM | POA: Diagnosis not present

## 2015-09-16 DIAGNOSIS — M722 Plantar fascial fibromatosis: Secondary | ICD-10-CM | POA: Diagnosis not present

## 2015-09-25 DIAGNOSIS — K219 Gastro-esophageal reflux disease without esophagitis: Secondary | ICD-10-CM | POA: Diagnosis not present

## 2015-09-25 DIAGNOSIS — E782 Mixed hyperlipidemia: Secondary | ICD-10-CM | POA: Diagnosis not present

## 2015-09-25 DIAGNOSIS — E1069 Type 1 diabetes mellitus with other specified complication: Secondary | ICD-10-CM | POA: Diagnosis not present

## 2015-09-25 DIAGNOSIS — I10 Essential (primary) hypertension: Secondary | ICD-10-CM | POA: Diagnosis not present

## 2015-10-01 DIAGNOSIS — M5432 Sciatica, left side: Secondary | ICD-10-CM | POA: Diagnosis not present

## 2015-10-01 DIAGNOSIS — M722 Plantar fascial fibromatosis: Secondary | ICD-10-CM | POA: Diagnosis not present

## 2015-10-01 DIAGNOSIS — E782 Mixed hyperlipidemia: Secondary | ICD-10-CM | POA: Diagnosis not present

## 2015-10-01 DIAGNOSIS — K219 Gastro-esophageal reflux disease without esophagitis: Secondary | ICD-10-CM | POA: Diagnosis not present

## 2015-10-01 DIAGNOSIS — M1000 Idiopathic gout, unspecified site: Secondary | ICD-10-CM | POA: Diagnosis not present

## 2015-10-01 DIAGNOSIS — I10 Essential (primary) hypertension: Secondary | ICD-10-CM | POA: Diagnosis not present

## 2015-10-02 DIAGNOSIS — Z86711 Personal history of pulmonary embolism: Secondary | ICD-10-CM | POA: Diagnosis not present

## 2015-10-02 DIAGNOSIS — M79651 Pain in right thigh: Secondary | ICD-10-CM | POA: Diagnosis not present

## 2015-10-02 DIAGNOSIS — M7989 Other specified soft tissue disorders: Secondary | ICD-10-CM | POA: Diagnosis not present

## 2015-11-09 DIAGNOSIS — M5126 Other intervertebral disc displacement, lumbar region: Secondary | ICD-10-CM | POA: Diagnosis not present

## 2015-11-09 DIAGNOSIS — Z6832 Body mass index (BMI) 32.0-32.9, adult: Secondary | ICD-10-CM | POA: Diagnosis not present

## 2015-11-09 DIAGNOSIS — M5416 Radiculopathy, lumbar region: Secondary | ICD-10-CM | POA: Diagnosis not present

## 2015-11-09 DIAGNOSIS — I10 Essential (primary) hypertension: Secondary | ICD-10-CM | POA: Diagnosis not present

## 2015-11-12 DIAGNOSIS — Z79899 Other long term (current) drug therapy: Secondary | ICD-10-CM | POA: Diagnosis not present

## 2015-11-12 DIAGNOSIS — E785 Hyperlipidemia, unspecified: Secondary | ICD-10-CM | POA: Diagnosis not present

## 2015-11-12 DIAGNOSIS — K219 Gastro-esophageal reflux disease without esophagitis: Secondary | ICD-10-CM | POA: Diagnosis not present

## 2015-11-12 DIAGNOSIS — Z87891 Personal history of nicotine dependence: Secondary | ICD-10-CM | POA: Diagnosis not present

## 2015-11-12 DIAGNOSIS — I10 Essential (primary) hypertension: Secondary | ICD-10-CM | POA: Diagnosis not present

## 2015-11-19 DIAGNOSIS — M5126 Other intervertebral disc displacement, lumbar region: Secondary | ICD-10-CM | POA: Diagnosis not present

## 2015-11-26 DIAGNOSIS — M5126 Other intervertebral disc displacement, lumbar region: Secondary | ICD-10-CM | POA: Diagnosis not present

## 2015-12-04 DIAGNOSIS — M5126 Other intervertebral disc displacement, lumbar region: Secondary | ICD-10-CM | POA: Diagnosis not present

## 2015-12-04 DIAGNOSIS — M5116 Intervertebral disc disorders with radiculopathy, lumbar region: Secondary | ICD-10-CM | POA: Diagnosis not present

## 2015-12-04 DIAGNOSIS — M5416 Radiculopathy, lumbar region: Secondary | ICD-10-CM | POA: Diagnosis not present

## 2016-01-28 DIAGNOSIS — E782 Mixed hyperlipidemia: Secondary | ICD-10-CM | POA: Diagnosis not present

## 2016-01-28 DIAGNOSIS — R739 Hyperglycemia, unspecified: Secondary | ICD-10-CM | POA: Diagnosis not present

## 2016-01-28 DIAGNOSIS — I10 Essential (primary) hypertension: Secondary | ICD-10-CM | POA: Diagnosis not present

## 2016-01-28 DIAGNOSIS — K219 Gastro-esophageal reflux disease without esophagitis: Secondary | ICD-10-CM | POA: Diagnosis not present

## 2016-02-04 DIAGNOSIS — K219 Gastro-esophageal reflux disease without esophagitis: Secondary | ICD-10-CM | POA: Diagnosis not present

## 2016-02-04 DIAGNOSIS — I10 Essential (primary) hypertension: Secondary | ICD-10-CM | POA: Diagnosis not present

## 2016-02-04 DIAGNOSIS — M1000 Idiopathic gout, unspecified site: Secondary | ICD-10-CM | POA: Diagnosis not present

## 2016-02-04 DIAGNOSIS — E782 Mixed hyperlipidemia: Secondary | ICD-10-CM | POA: Diagnosis not present

## 2016-02-04 DIAGNOSIS — M5432 Sciatica, left side: Secondary | ICD-10-CM | POA: Diagnosis not present

## 2016-02-04 DIAGNOSIS — M722 Plantar fascial fibromatosis: Secondary | ICD-10-CM | POA: Diagnosis not present

## 2016-06-10 DIAGNOSIS — M1000 Idiopathic gout, unspecified site: Secondary | ICD-10-CM | POA: Diagnosis not present

## 2016-06-10 DIAGNOSIS — E1069 Type 1 diabetes mellitus with other specified complication: Secondary | ICD-10-CM | POA: Diagnosis not present

## 2016-06-10 DIAGNOSIS — E782 Mixed hyperlipidemia: Secondary | ICD-10-CM | POA: Diagnosis not present

## 2016-06-10 DIAGNOSIS — K219 Gastro-esophageal reflux disease without esophagitis: Secondary | ICD-10-CM | POA: Diagnosis not present

## 2016-06-10 DIAGNOSIS — I10 Essential (primary) hypertension: Secondary | ICD-10-CM | POA: Diagnosis not present

## 2016-06-15 DIAGNOSIS — I10 Essential (primary) hypertension: Secondary | ICD-10-CM | POA: Diagnosis not present

## 2016-06-15 DIAGNOSIS — M5432 Sciatica, left side: Secondary | ICD-10-CM | POA: Diagnosis not present

## 2016-06-15 DIAGNOSIS — E782 Mixed hyperlipidemia: Secondary | ICD-10-CM | POA: Diagnosis not present

## 2016-06-15 DIAGNOSIS — M722 Plantar fascial fibromatosis: Secondary | ICD-10-CM | POA: Diagnosis not present

## 2016-06-15 DIAGNOSIS — K219 Gastro-esophageal reflux disease without esophagitis: Secondary | ICD-10-CM | POA: Diagnosis not present

## 2016-06-15 DIAGNOSIS — Z6831 Body mass index (BMI) 31.0-31.9, adult: Secondary | ICD-10-CM | POA: Diagnosis not present

## 2016-06-15 DIAGNOSIS — M1000 Idiopathic gout, unspecified site: Secondary | ICD-10-CM | POA: Diagnosis not present

## 2016-07-07 DIAGNOSIS — E7800 Pure hypercholesterolemia, unspecified: Secondary | ICD-10-CM | POA: Diagnosis not present

## 2016-07-07 DIAGNOSIS — H2510 Age-related nuclear cataract, unspecified eye: Secondary | ICD-10-CM | POA: Diagnosis not present

## 2016-07-07 DIAGNOSIS — Z0100 Encounter for examination of eyes and vision without abnormal findings: Secondary | ICD-10-CM | POA: Diagnosis not present

## 2016-10-14 DIAGNOSIS — E1069 Type 1 diabetes mellitus with other specified complication: Secondary | ICD-10-CM | POA: Diagnosis not present

## 2016-10-14 DIAGNOSIS — M1000 Idiopathic gout, unspecified site: Secondary | ICD-10-CM | POA: Diagnosis not present

## 2016-10-14 DIAGNOSIS — I10 Essential (primary) hypertension: Secondary | ICD-10-CM | POA: Diagnosis not present

## 2016-10-14 DIAGNOSIS — E782 Mixed hyperlipidemia: Secondary | ICD-10-CM | POA: Diagnosis not present

## 2016-10-14 DIAGNOSIS — K219 Gastro-esophageal reflux disease without esophagitis: Secondary | ICD-10-CM | POA: Diagnosis not present

## 2016-10-18 DIAGNOSIS — K219 Gastro-esophageal reflux disease without esophagitis: Secondary | ICD-10-CM | POA: Diagnosis not present

## 2016-10-18 DIAGNOSIS — E782 Mixed hyperlipidemia: Secondary | ICD-10-CM | POA: Diagnosis not present

## 2016-10-18 DIAGNOSIS — Z6832 Body mass index (BMI) 32.0-32.9, adult: Secondary | ICD-10-CM | POA: Diagnosis not present

## 2016-10-18 DIAGNOSIS — M5432 Sciatica, left side: Secondary | ICD-10-CM | POA: Diagnosis not present

## 2016-10-18 DIAGNOSIS — M1000 Idiopathic gout, unspecified site: Secondary | ICD-10-CM | POA: Diagnosis not present

## 2016-10-18 DIAGNOSIS — E119 Type 2 diabetes mellitus without complications: Secondary | ICD-10-CM | POA: Diagnosis not present

## 2016-10-18 DIAGNOSIS — M722 Plantar fascial fibromatosis: Secondary | ICD-10-CM | POA: Diagnosis not present

## 2016-10-18 DIAGNOSIS — I10 Essential (primary) hypertension: Secondary | ICD-10-CM | POA: Diagnosis not present

## 2016-11-01 DIAGNOSIS — R062 Wheezing: Secondary | ICD-10-CM | POA: Diagnosis not present

## 2016-11-01 DIAGNOSIS — Z6831 Body mass index (BMI) 31.0-31.9, adult: Secondary | ICD-10-CM | POA: Diagnosis not present

## 2016-11-01 DIAGNOSIS — J209 Acute bronchitis, unspecified: Secondary | ICD-10-CM | POA: Diagnosis not present

## 2016-11-30 DIAGNOSIS — Z6830 Body mass index (BMI) 30.0-30.9, adult: Secondary | ICD-10-CM | POA: Diagnosis not present

## 2016-11-30 DIAGNOSIS — M545 Low back pain: Secondary | ICD-10-CM | POA: Diagnosis not present

## 2016-11-30 DIAGNOSIS — M62830 Muscle spasm of back: Secondary | ICD-10-CM | POA: Diagnosis not present

## 2016-12-08 DIAGNOSIS — Z6830 Body mass index (BMI) 30.0-30.9, adult: Secondary | ICD-10-CM | POA: Diagnosis not present

## 2016-12-08 DIAGNOSIS — M545 Low back pain: Secondary | ICD-10-CM | POA: Diagnosis not present

## 2016-12-08 DIAGNOSIS — E119 Type 2 diabetes mellitus without complications: Secondary | ICD-10-CM | POA: Diagnosis not present

## 2016-12-20 DIAGNOSIS — I10 Essential (primary) hypertension: Secondary | ICD-10-CM | POA: Diagnosis not present

## 2016-12-20 DIAGNOSIS — K219 Gastro-esophageal reflux disease without esophagitis: Secondary | ICD-10-CM | POA: Diagnosis not present

## 2017-02-07 DIAGNOSIS — H00029 Hordeolum internum unspecified eye, unspecified eyelid: Secondary | ICD-10-CM | POA: Diagnosis not present

## 2017-02-07 DIAGNOSIS — H43813 Vitreous degeneration, bilateral: Secondary | ICD-10-CM | POA: Diagnosis not present

## 2017-02-07 DIAGNOSIS — H43823 Vitreomacular adhesion, bilateral: Secondary | ICD-10-CM | POA: Diagnosis not present

## 2017-02-07 DIAGNOSIS — H353131 Nonexudative age-related macular degeneration, bilateral, early dry stage: Secondary | ICD-10-CM | POA: Diagnosis not present

## 2017-02-13 DIAGNOSIS — M1000 Idiopathic gout, unspecified site: Secondary | ICD-10-CM | POA: Diagnosis not present

## 2017-02-13 DIAGNOSIS — K219 Gastro-esophageal reflux disease without esophagitis: Secondary | ICD-10-CM | POA: Diagnosis not present

## 2017-02-13 DIAGNOSIS — E1069 Type 1 diabetes mellitus with other specified complication: Secondary | ICD-10-CM | POA: Diagnosis not present

## 2017-02-13 DIAGNOSIS — E782 Mixed hyperlipidemia: Secondary | ICD-10-CM | POA: Diagnosis not present

## 2017-02-13 DIAGNOSIS — I10 Essential (primary) hypertension: Secondary | ICD-10-CM | POA: Diagnosis not present

## 2017-02-16 DIAGNOSIS — Z6830 Body mass index (BMI) 30.0-30.9, adult: Secondary | ICD-10-CM | POA: Diagnosis not present

## 2017-02-16 DIAGNOSIS — M545 Low back pain: Secondary | ICD-10-CM | POA: Diagnosis not present

## 2017-02-16 DIAGNOSIS — M109 Gout, unspecified: Secondary | ICD-10-CM | POA: Diagnosis not present

## 2017-02-16 DIAGNOSIS — K219 Gastro-esophageal reflux disease without esophagitis: Secondary | ICD-10-CM | POA: Diagnosis not present

## 2017-02-16 DIAGNOSIS — I10 Essential (primary) hypertension: Secondary | ICD-10-CM | POA: Diagnosis not present

## 2017-02-16 DIAGNOSIS — E119 Type 2 diabetes mellitus without complications: Secondary | ICD-10-CM | POA: Diagnosis not present

## 2017-07-04 DIAGNOSIS — I10 Essential (primary) hypertension: Secondary | ICD-10-CM | POA: Diagnosis not present

## 2017-07-04 DIAGNOSIS — K219 Gastro-esophageal reflux disease without esophagitis: Secondary | ICD-10-CM | POA: Diagnosis not present

## 2017-07-04 DIAGNOSIS — E782 Mixed hyperlipidemia: Secondary | ICD-10-CM | POA: Diagnosis not present

## 2017-07-04 DIAGNOSIS — E1069 Type 1 diabetes mellitus with other specified complication: Secondary | ICD-10-CM | POA: Diagnosis not present

## 2017-07-06 DIAGNOSIS — K219 Gastro-esophageal reflux disease without esophagitis: Secondary | ICD-10-CM | POA: Diagnosis not present

## 2017-07-06 DIAGNOSIS — I10 Essential (primary) hypertension: Secondary | ICD-10-CM | POA: Diagnosis not present

## 2017-07-06 DIAGNOSIS — Z6830 Body mass index (BMI) 30.0-30.9, adult: Secondary | ICD-10-CM | POA: Diagnosis not present

## 2017-07-06 DIAGNOSIS — M109 Gout, unspecified: Secondary | ICD-10-CM | POA: Diagnosis not present

## 2017-07-06 DIAGNOSIS — M545 Low back pain: Secondary | ICD-10-CM | POA: Diagnosis not present

## 2017-07-06 DIAGNOSIS — E119 Type 2 diabetes mellitus without complications: Secondary | ICD-10-CM | POA: Diagnosis not present

## 2017-11-03 DIAGNOSIS — E119 Type 2 diabetes mellitus without complications: Secondary | ICD-10-CM | POA: Diagnosis not present

## 2017-11-03 DIAGNOSIS — K219 Gastro-esophageal reflux disease without esophagitis: Secondary | ICD-10-CM | POA: Diagnosis not present

## 2017-11-03 DIAGNOSIS — E782 Mixed hyperlipidemia: Secondary | ICD-10-CM | POA: Diagnosis not present

## 2017-11-03 DIAGNOSIS — M1000 Idiopathic gout, unspecified site: Secondary | ICD-10-CM | POA: Diagnosis not present

## 2017-11-03 DIAGNOSIS — I10 Essential (primary) hypertension: Secondary | ICD-10-CM | POA: Diagnosis not present

## 2017-11-08 DIAGNOSIS — M545 Low back pain: Secondary | ICD-10-CM | POA: Diagnosis not present

## 2017-11-08 DIAGNOSIS — I10 Essential (primary) hypertension: Secondary | ICD-10-CM | POA: Diagnosis not present

## 2017-11-08 DIAGNOSIS — M109 Gout, unspecified: Secondary | ICD-10-CM | POA: Diagnosis not present

## 2017-11-08 DIAGNOSIS — E119 Type 2 diabetes mellitus without complications: Secondary | ICD-10-CM | POA: Diagnosis not present

## 2017-11-08 DIAGNOSIS — R0600 Dyspnea, unspecified: Secondary | ICD-10-CM | POA: Diagnosis not present

## 2017-11-08 DIAGNOSIS — R0902 Hypoxemia: Secondary | ICD-10-CM | POA: Diagnosis not present

## 2017-11-08 DIAGNOSIS — Z6830 Body mass index (BMI) 30.0-30.9, adult: Secondary | ICD-10-CM | POA: Diagnosis not present

## 2017-11-08 DIAGNOSIS — R9431 Abnormal electrocardiogram [ECG] [EKG]: Secondary | ICD-10-CM | POA: Diagnosis not present

## 2017-11-16 DIAGNOSIS — R0600 Dyspnea, unspecified: Secondary | ICD-10-CM | POA: Diagnosis not present

## 2017-11-16 DIAGNOSIS — Z6831 Body mass index (BMI) 31.0-31.9, adult: Secondary | ICD-10-CM | POA: Diagnosis not present

## 2017-11-17 DIAGNOSIS — I10 Essential (primary) hypertension: Secondary | ICD-10-CM | POA: Diagnosis not present

## 2017-11-17 DIAGNOSIS — R0600 Dyspnea, unspecified: Secondary | ICD-10-CM | POA: Diagnosis not present

## 2017-11-17 DIAGNOSIS — R9431 Abnormal electrocardiogram [ECG] [EKG]: Secondary | ICD-10-CM | POA: Diagnosis not present

## 2017-11-30 DIAGNOSIS — R0600 Dyspnea, unspecified: Secondary | ICD-10-CM | POA: Diagnosis not present

## 2017-12-11 DIAGNOSIS — I10 Essential (primary) hypertension: Secondary | ICD-10-CM | POA: Diagnosis not present

## 2017-12-14 DIAGNOSIS — R918 Other nonspecific abnormal finding of lung field: Secondary | ICD-10-CM | POA: Diagnosis not present

## 2017-12-14 DIAGNOSIS — I10 Essential (primary) hypertension: Secondary | ICD-10-CM | POA: Diagnosis not present

## 2017-12-14 DIAGNOSIS — R930 Abnormal findings on diagnostic imaging of skull and head, not elsewhere classified: Secondary | ICD-10-CM | POA: Diagnosis not present

## 2017-12-14 DIAGNOSIS — R0602 Shortness of breath: Secondary | ICD-10-CM | POA: Diagnosis not present

## 2017-12-14 DIAGNOSIS — K449 Diaphragmatic hernia without obstruction or gangrene: Secondary | ICD-10-CM | POA: Diagnosis not present

## 2017-12-14 DIAGNOSIS — I2510 Atherosclerotic heart disease of native coronary artery without angina pectoris: Secondary | ICD-10-CM | POA: Diagnosis not present

## 2017-12-14 DIAGNOSIS — I700 Atherosclerosis of aorta: Secondary | ICD-10-CM | POA: Diagnosis not present

## 2017-12-19 DIAGNOSIS — Z6830 Body mass index (BMI) 30.0-30.9, adult: Secondary | ICD-10-CM | POA: Diagnosis not present

## 2017-12-19 DIAGNOSIS — I8393 Asymptomatic varicose veins of bilateral lower extremities: Secondary | ICD-10-CM | POA: Diagnosis not present

## 2017-12-19 DIAGNOSIS — K649 Unspecified hemorrhoids: Secondary | ICD-10-CM | POA: Diagnosis not present

## 2017-12-19 DIAGNOSIS — I10 Essential (primary) hypertension: Secondary | ICD-10-CM | POA: Diagnosis not present

## 2017-12-25 ENCOUNTER — Ambulatory Visit (INDEPENDENT_AMBULATORY_CARE_PROVIDER_SITE_OTHER): Payer: Medicare HMO | Admitting: Vascular Surgery

## 2017-12-25 ENCOUNTER — Encounter: Payer: Self-pay | Admitting: Vascular Surgery

## 2017-12-25 VITALS — BP 149/88 | HR 74 | Temp 98.4°F | Resp 20 | Ht 69.0 in | Wt 210.0 lb

## 2017-12-25 DIAGNOSIS — I83893 Varicose veins of bilateral lower extremities with other complications: Secondary | ICD-10-CM

## 2017-12-25 NOTE — Progress Notes (Signed)
Subjective:     Patient ID: Andrew Adams, male   DOB: 23-Nov-1945, 72 y.o.   MRN: [id number]  HPI This 72 year old male was referred by Aggie Hacker, PA evaluation of right leg l painful varicose veins and had no previous treatment. Patient states he was have aching and throbbing discomfort in the right medial thigh medial calf a few months ago but now the symptoms have resolved. He has no swelling. He does not were last compression stockings. Has no history of DVT thrombophlebitis stasis ulcers or bleeding. He has no problems with the left leg.  Past Medical History:  Diagnosis Date  . GERD (gastroesophageal reflux disease)   . Gout   . Hyperlipidemia   . Hypertension   . PE (pulmonary embolism)     Social History   Tobacco Use  . Smoking status: Former Smoker    Last attempt to quit: 02/07/1995    Years since quitting: 22.8  . Smokeless tobacco: Never Used  Substance Use Topics  . Alcohol use: No    Family History  Problem Relation Age of Onset  . Hypertension Sister   . Diabetes Sister   . Diabetes Sister   . Hypertension Mother   . Diabetes Mother   . Hypertension Father   . Ulcers Father     No Known Allergies   Current Outpatient Medications:  .  allopurinol (ZYLOPRIM) 100 MG tablet, Take 1 tablet (100 mg total) by mouth daily., Disp: 90 tablet, Rfl: 1 .  amLODipine (NORVASC) 5 MG tablet, TAKE 1 TABLET EVERY DAY, Disp: 90 tablet, Rfl: 0 .  atorvastatin (LIPITOR) 40 MG tablet, Take 1 tablet (40 mg total) by mouth daily., Disp: 90 tablet, Rfl: 1 .  cyclobenzaprine (FLEXERIL) 5 MG tablet, Take 5 mg by mouth 3 (three) times daily., Disp: , Rfl:  .  fenofibrate 160 MG tablet, Take 1 tablet (160 mg total) by mouth daily., Disp: 90 tablet, Rfl: 1 .  lisinopril-hydrochlorothiazide (PRINZIDE,ZESTORETIC) 20-12.5 MG per tablet, , Disp: , Rfl:  .  metoprolol (LOPRESSOR) 50 MG tablet, Take 1 tablet (50 mg total) by mouth 2 (two) times daily., Disp: 180 tablet, Rfl: 1 .   pantoprazole (PROTONIX) 40 MG tablet, Take 1 tablet (40 mg total) by mouth daily., Disp: 90 tablet, Rfl: 1 .  polyethylene glycol powder (GLYCOLAX/MIRALAX) powder, One capful po daily prn constipation, Disp: 850 g, Rfl: 0 .  traMADol (ULTRAM) 50 MG tablet, Take 1 tablet (50 mg total) by mouth every 6 (six) hours as needed., Disp: 40 tablet, Rfl: 0  Vitals:   12/25/17 1505 12/25/17 1506  BP: (!) 158/91 (!) 149/88  Pulse: 74   Resp: 20   Temp: 98.4 F (36.9 C)   TempSrc: Oral   SpO2: 97%   Weight: 210 lb (95.3 kg)   Height: 5\' 9"  (1.753 m)     Body mass index is 31.01 kg/m.         Review of Systems Denies chest pain,  PND, orthopnea, hemoptysis, claudication-does have diabetes mellitus type 2 and dyspnea on exertion    Objective:   Physical Exam BP (!) 149/88 Comment: recheck  Pulse 74   Temp 98.4 F (36.9 C) (Oral)   Resp 20   Ht 5\' 9"  (1.753 m)   Wt 210 lb (95.3 kg)   SpO2 97%   BMI 31.01 kg/m     Gen.-alert and oriented x3 in no apparent distress HEENT normal for age Lungs no rhonchi or wheezing Cardiovascular regular rhythm  no murmurs carotid pulses 3+ palpable no bruits audible Abdomen soft nontender no palpable masses Musculoskeletal free of  major deformities Skin clear -no rashes Neurologic normal Lower extremities 3+ femoral and dorsalis pedis pulses palpable bilaterally with no edema Right leg with moderate size-3 cm-bulging varicosities in the mid thigh over great saphenous vein in slightly smaller varicosities in the medial calf over the great saphenous vein. No hyperpigmentation ulceration or edema distally. No other bulging varicosities noted.  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 He was having significant symptoms a few months ago. If the symptoms recur he will be back in touch with [place] for further consideration for treatment

## 2017-12-27 DIAGNOSIS — K648 Other hemorrhoids: Secondary | ICD-10-CM | POA: Diagnosis not present

## 2018-02-07 DIAGNOSIS — K648 Other hemorrhoids: Secondary | ICD-10-CM | POA: Diagnosis not present

## 2018-03-06 DIAGNOSIS — K625 Hemorrhage of anus and rectum: Secondary | ICD-10-CM | POA: Diagnosis not present

## 2018-03-06 DIAGNOSIS — Z7984 Long term (current) use of oral hypoglycemic drugs: Secondary | ICD-10-CM | POA: Diagnosis not present

## 2018-03-06 DIAGNOSIS — K5731 Diverticulosis of large intestine without perforation or abscess with bleeding: Secondary | ICD-10-CM | POA: Diagnosis not present

## 2018-03-06 DIAGNOSIS — E119 Type 2 diabetes mellitus without complications: Secondary | ICD-10-CM | POA: Diagnosis not present

## 2018-03-06 DIAGNOSIS — K640 First degree hemorrhoids: Secondary | ICD-10-CM | POA: Diagnosis not present

## 2018-03-06 DIAGNOSIS — J449 Chronic obstructive pulmonary disease, unspecified: Secondary | ICD-10-CM | POA: Diagnosis not present

## 2018-03-06 DIAGNOSIS — K219 Gastro-esophageal reflux disease without esophagitis: Secondary | ICD-10-CM | POA: Diagnosis not present

## 2018-03-06 DIAGNOSIS — Z79899 Other long term (current) drug therapy: Secondary | ICD-10-CM | POA: Diagnosis not present

## 2018-03-06 DIAGNOSIS — Z87891 Personal history of nicotine dependence: Secondary | ICD-10-CM | POA: Diagnosis not present

## 2018-03-06 DIAGNOSIS — I10 Essential (primary) hypertension: Secondary | ICD-10-CM | POA: Diagnosis not present

## 2018-03-06 DIAGNOSIS — R0902 Hypoxemia: Secondary | ICD-10-CM | POA: Diagnosis not present

## 2018-03-06 DIAGNOSIS — K5730 Diverticulosis of large intestine without perforation or abscess without bleeding: Secondary | ICD-10-CM | POA: Diagnosis not present

## 2018-03-08 DIAGNOSIS — E119 Type 2 diabetes mellitus without complications: Secondary | ICD-10-CM | POA: Diagnosis not present

## 2018-03-08 DIAGNOSIS — M109 Gout, unspecified: Secondary | ICD-10-CM | POA: Diagnosis not present

## 2018-03-08 DIAGNOSIS — E782 Mixed hyperlipidemia: Secondary | ICD-10-CM | POA: Diagnosis not present

## 2018-03-08 DIAGNOSIS — Z6830 Body mass index (BMI) 30.0-30.9, adult: Secondary | ICD-10-CM | POA: Diagnosis not present

## 2018-03-08 DIAGNOSIS — I10 Essential (primary) hypertension: Secondary | ICD-10-CM | POA: Diagnosis not present

## 2018-03-08 DIAGNOSIS — Z0001 Encounter for general adult medical examination with abnormal findings: Secondary | ICD-10-CM | POA: Diagnosis not present

## 2018-03-08 DIAGNOSIS — K219 Gastro-esophageal reflux disease without esophagitis: Secondary | ICD-10-CM | POA: Diagnosis not present

## 2018-03-23 DIAGNOSIS — E109 Type 1 diabetes mellitus without complications: Secondary | ICD-10-CM | POA: Diagnosis not present

## 2018-03-23 DIAGNOSIS — I10 Essential (primary) hypertension: Secondary | ICD-10-CM | POA: Diagnosis not present

## 2018-03-23 DIAGNOSIS — H35373 Puckering of macula, bilateral: Secondary | ICD-10-CM | POA: Diagnosis not present

## 2018-03-23 DIAGNOSIS — Z0100 Encounter for examination of eyes and vision without abnormal findings: Secondary | ICD-10-CM | POA: Diagnosis not present

## 2018-05-07 DIAGNOSIS — K648 Other hemorrhoids: Secondary | ICD-10-CM | POA: Diagnosis not present

## 2018-05-18 DIAGNOSIS — E119 Type 2 diabetes mellitus without complications: Secondary | ICD-10-CM | POA: Diagnosis not present

## 2018-05-18 DIAGNOSIS — N529 Male erectile dysfunction, unspecified: Secondary | ICD-10-CM | POA: Diagnosis not present

## 2018-05-18 DIAGNOSIS — E669 Obesity, unspecified: Secondary | ICD-10-CM | POA: Diagnosis not present

## 2018-05-18 DIAGNOSIS — M109 Gout, unspecified: Secondary | ICD-10-CM | POA: Diagnosis not present

## 2018-05-18 DIAGNOSIS — E785 Hyperlipidemia, unspecified: Secondary | ICD-10-CM | POA: Diagnosis not present

## 2018-05-18 DIAGNOSIS — I10 Essential (primary) hypertension: Secondary | ICD-10-CM | POA: Diagnosis not present

## 2018-05-18 DIAGNOSIS — G3184 Mild cognitive impairment, so stated: Secondary | ICD-10-CM | POA: Diagnosis not present

## 2018-05-18 DIAGNOSIS — J449 Chronic obstructive pulmonary disease, unspecified: Secondary | ICD-10-CM | POA: Diagnosis not present

## 2018-05-18 DIAGNOSIS — K219 Gastro-esophageal reflux disease without esophagitis: Secondary | ICD-10-CM | POA: Diagnosis not present

## 2018-05-18 DIAGNOSIS — R69 Illness, unspecified: Secondary | ICD-10-CM | POA: Diagnosis not present

## 2018-07-26 DIAGNOSIS — I10 Essential (primary) hypertension: Secondary | ICD-10-CM | POA: Diagnosis not present

## 2018-07-26 DIAGNOSIS — E119 Type 2 diabetes mellitus without complications: Secondary | ICD-10-CM | POA: Diagnosis not present

## 2018-07-26 DIAGNOSIS — M109 Gout, unspecified: Secondary | ICD-10-CM | POA: Diagnosis not present

## 2018-07-26 DIAGNOSIS — E782 Mixed hyperlipidemia: Secondary | ICD-10-CM | POA: Diagnosis not present

## 2018-07-26 DIAGNOSIS — Z6829 Body mass index (BMI) 29.0-29.9, adult: Secondary | ICD-10-CM | POA: Diagnosis not present

## 2018-07-30 DIAGNOSIS — I10 Essential (primary) hypertension: Secondary | ICD-10-CM | POA: Diagnosis not present

## 2018-07-30 DIAGNOSIS — K219 Gastro-esophageal reflux disease without esophagitis: Secondary | ICD-10-CM | POA: Diagnosis not present

## 2018-07-30 DIAGNOSIS — R0902 Hypoxemia: Secondary | ICD-10-CM | POA: Diagnosis not present

## 2018-07-30 DIAGNOSIS — E119 Type 2 diabetes mellitus without complications: Secondary | ICD-10-CM | POA: Diagnosis not present

## 2018-08-18 DIAGNOSIS — Z23 Encounter for immunization: Secondary | ICD-10-CM | POA: Diagnosis not present

## 2018-09-11 DIAGNOSIS — R0902 Hypoxemia: Secondary | ICD-10-CM | POA: Diagnosis not present

## 2018-09-11 DIAGNOSIS — I10 Essential (primary) hypertension: Secondary | ICD-10-CM | POA: Diagnosis not present

## 2018-09-11 DIAGNOSIS — K219 Gastro-esophageal reflux disease without esophagitis: Secondary | ICD-10-CM | POA: Diagnosis not present

## 2018-09-11 DIAGNOSIS — E119 Type 2 diabetes mellitus without complications: Secondary | ICD-10-CM | POA: Diagnosis not present

## 2018-10-08 DIAGNOSIS — I10 Essential (primary) hypertension: Secondary | ICD-10-CM | POA: Diagnosis not present

## 2018-10-08 DIAGNOSIS — R809 Proteinuria, unspecified: Secondary | ICD-10-CM | POA: Diagnosis not present

## 2018-10-08 DIAGNOSIS — E1121 Type 2 diabetes mellitus with diabetic nephropathy: Secondary | ICD-10-CM | POA: Diagnosis not present

## 2018-10-08 DIAGNOSIS — N184 Chronic kidney disease, stage 4 (severe): Secondary | ICD-10-CM | POA: Diagnosis not present

## 2018-10-29 DIAGNOSIS — N183 Chronic kidney disease, stage 3 (moderate): Secondary | ICD-10-CM | POA: Diagnosis not present

## 2018-10-29 DIAGNOSIS — I129 Hypertensive chronic kidney disease with stage 1 through stage 4 chronic kidney disease, or unspecified chronic kidney disease: Secondary | ICD-10-CM | POA: Diagnosis not present

## 2018-10-29 DIAGNOSIS — R809 Proteinuria, unspecified: Secondary | ICD-10-CM | POA: Diagnosis not present

## 2018-10-29 DIAGNOSIS — Z79899 Other long term (current) drug therapy: Secondary | ICD-10-CM | POA: Diagnosis not present

## 2018-10-29 DIAGNOSIS — E559 Vitamin D deficiency, unspecified: Secondary | ICD-10-CM | POA: Diagnosis not present

## 2018-10-29 DIAGNOSIS — Z1159 Encounter for screening for other viral diseases: Secondary | ICD-10-CM | POA: Diagnosis not present

## 2018-10-29 DIAGNOSIS — D509 Iron deficiency anemia, unspecified: Secondary | ICD-10-CM | POA: Diagnosis not present

## 2018-11-15 DIAGNOSIS — N183 Chronic kidney disease, stage 3 (moderate): Secondary | ICD-10-CM | POA: Diagnosis not present

## 2018-11-15 DIAGNOSIS — I10 Essential (primary) hypertension: Secondary | ICD-10-CM | POA: Diagnosis not present

## 2018-11-15 DIAGNOSIS — M109 Gout, unspecified: Secondary | ICD-10-CM | POA: Diagnosis not present

## 2018-11-15 DIAGNOSIS — E1121 Type 2 diabetes mellitus with diabetic nephropathy: Secondary | ICD-10-CM | POA: Diagnosis not present

## 2018-11-21 DIAGNOSIS — M109 Gout, unspecified: Secondary | ICD-10-CM | POA: Diagnosis not present

## 2018-11-21 DIAGNOSIS — R221 Localized swelling, mass and lump, neck: Secondary | ICD-10-CM | POA: Diagnosis not present

## 2018-11-21 DIAGNOSIS — N189 Chronic kidney disease, unspecified: Secondary | ICD-10-CM | POA: Diagnosis not present

## 2018-11-21 DIAGNOSIS — I10 Essential (primary) hypertension: Secondary | ICD-10-CM | POA: Diagnosis not present

## 2018-11-21 DIAGNOSIS — K219 Gastro-esophageal reflux disease without esophagitis: Secondary | ICD-10-CM | POA: Diagnosis not present

## 2018-11-21 DIAGNOSIS — E119 Type 2 diabetes mellitus without complications: Secondary | ICD-10-CM | POA: Diagnosis not present

## 2018-11-21 DIAGNOSIS — E782 Mixed hyperlipidemia: Secondary | ICD-10-CM | POA: Diagnosis not present

## 2018-11-21 DIAGNOSIS — Z6830 Body mass index (BMI) 30.0-30.9, adult: Secondary | ICD-10-CM | POA: Diagnosis not present

## 2018-11-23 DIAGNOSIS — R6889 Other general symptoms and signs: Secondary | ICD-10-CM | POA: Diagnosis not present

## 2018-11-23 DIAGNOSIS — E041 Nontoxic single thyroid nodule: Secondary | ICD-10-CM | POA: Diagnosis not present

## 2019-01-08 DIAGNOSIS — N183 Chronic kidney disease, stage 3 (moderate): Secondary | ICD-10-CM | POA: Diagnosis not present

## 2019-01-08 DIAGNOSIS — I129 Hypertensive chronic kidney disease with stage 1 through stage 4 chronic kidney disease, or unspecified chronic kidney disease: Secondary | ICD-10-CM | POA: Diagnosis not present

## 2019-01-08 DIAGNOSIS — M109 Gout, unspecified: Secondary | ICD-10-CM | POA: Diagnosis not present

## 2019-01-08 DIAGNOSIS — Z79899 Other long term (current) drug therapy: Secondary | ICD-10-CM | POA: Diagnosis not present

## 2019-01-08 DIAGNOSIS — E559 Vitamin D deficiency, unspecified: Secondary | ICD-10-CM | POA: Diagnosis not present

## 2019-01-08 DIAGNOSIS — R809 Proteinuria, unspecified: Secondary | ICD-10-CM | POA: Diagnosis not present

## 2019-01-08 DIAGNOSIS — D509 Iron deficiency anemia, unspecified: Secondary | ICD-10-CM | POA: Diagnosis not present

## 2019-01-15 DIAGNOSIS — I10 Essential (primary) hypertension: Secondary | ICD-10-CM | POA: Diagnosis not present

## 2019-01-15 DIAGNOSIS — E1129 Type 2 diabetes mellitus with other diabetic kidney complication: Secondary | ICD-10-CM | POA: Diagnosis not present

## 2019-01-15 DIAGNOSIS — M1000 Idiopathic gout, unspecified site: Secondary | ICD-10-CM | POA: Diagnosis not present

## 2019-01-15 DIAGNOSIS — N183 Chronic kidney disease, stage 3 (moderate): Secondary | ICD-10-CM | POA: Diagnosis not present

## 2019-03-08 ENCOUNTER — Encounter: Payer: Self-pay | Admitting: Gastroenterology

## 2019-03-20 DIAGNOSIS — N189 Chronic kidney disease, unspecified: Secondary | ICD-10-CM | POA: Diagnosis not present

## 2019-03-20 DIAGNOSIS — E119 Type 2 diabetes mellitus without complications: Secondary | ICD-10-CM | POA: Diagnosis not present

## 2019-03-20 DIAGNOSIS — N289 Disorder of kidney and ureter, unspecified: Secondary | ICD-10-CM | POA: Diagnosis not present

## 2019-03-20 DIAGNOSIS — I10 Essential (primary) hypertension: Secondary | ICD-10-CM | POA: Diagnosis not present

## 2019-04-04 DIAGNOSIS — E119 Type 2 diabetes mellitus without complications: Secondary | ICD-10-CM | POA: Diagnosis not present

## 2019-04-04 DIAGNOSIS — E782 Mixed hyperlipidemia: Secondary | ICD-10-CM | POA: Diagnosis not present

## 2019-04-04 DIAGNOSIS — I129 Hypertensive chronic kidney disease with stage 1 through stage 4 chronic kidney disease, or unspecified chronic kidney disease: Secondary | ICD-10-CM | POA: Diagnosis not present

## 2019-04-04 DIAGNOSIS — K219 Gastro-esophageal reflux disease without esophagitis: Secondary | ICD-10-CM | POA: Diagnosis not present

## 2019-04-04 DIAGNOSIS — N189 Chronic kidney disease, unspecified: Secondary | ICD-10-CM | POA: Diagnosis not present

## 2019-04-04 DIAGNOSIS — I10 Essential (primary) hypertension: Secondary | ICD-10-CM | POA: Diagnosis not present

## 2019-04-04 DIAGNOSIS — Z6830 Body mass index (BMI) 30.0-30.9, adult: Secondary | ICD-10-CM | POA: Diagnosis not present

## 2019-04-04 DIAGNOSIS — M109 Gout, unspecified: Secondary | ICD-10-CM | POA: Diagnosis not present

## 2019-04-04 DIAGNOSIS — R0600 Dyspnea, unspecified: Secondary | ICD-10-CM | POA: Diagnosis not present

## 2019-04-04 DIAGNOSIS — E1122 Type 2 diabetes mellitus with diabetic chronic kidney disease: Secondary | ICD-10-CM | POA: Diagnosis not present

## 2019-04-08 DIAGNOSIS — Z1389 Encounter for screening for other disorder: Secondary | ICD-10-CM | POA: Diagnosis not present

## 2019-04-08 DIAGNOSIS — Z6830 Body mass index (BMI) 30.0-30.9, adult: Secondary | ICD-10-CM | POA: Diagnosis not present

## 2019-04-08 DIAGNOSIS — I10 Essential (primary) hypertension: Secondary | ICD-10-CM | POA: Diagnosis not present

## 2019-04-08 DIAGNOSIS — E782 Mixed hyperlipidemia: Secondary | ICD-10-CM | POA: Diagnosis not present

## 2019-04-08 DIAGNOSIS — M109 Gout, unspecified: Secondary | ICD-10-CM | POA: Diagnosis not present

## 2019-04-08 DIAGNOSIS — K219 Gastro-esophageal reflux disease without esophagitis: Secondary | ICD-10-CM | POA: Diagnosis not present

## 2019-04-08 DIAGNOSIS — R0600 Dyspnea, unspecified: Secondary | ICD-10-CM | POA: Diagnosis not present

## 2019-04-08 DIAGNOSIS — N189 Chronic kidney disease, unspecified: Secondary | ICD-10-CM | POA: Diagnosis not present

## 2019-04-08 DIAGNOSIS — E119 Type 2 diabetes mellitus without complications: Secondary | ICD-10-CM | POA: Diagnosis not present

## 2019-04-11 DIAGNOSIS — Z6829 Body mass index (BMI) 29.0-29.9, adult: Secondary | ICD-10-CM | POA: Diagnosis not present

## 2019-04-11 DIAGNOSIS — E119 Type 2 diabetes mellitus without complications: Secondary | ICD-10-CM | POA: Diagnosis not present

## 2019-04-11 DIAGNOSIS — I10 Essential (primary) hypertension: Secondary | ICD-10-CM | POA: Diagnosis not present

## 2019-04-11 DIAGNOSIS — M109 Gout, unspecified: Secondary | ICD-10-CM | POA: Diagnosis not present

## 2019-04-11 DIAGNOSIS — Z6830 Body mass index (BMI) 30.0-30.9, adult: Secondary | ICD-10-CM | POA: Diagnosis not present

## 2019-04-11 DIAGNOSIS — E782 Mixed hyperlipidemia: Secondary | ICD-10-CM | POA: Diagnosis not present

## 2019-04-11 DIAGNOSIS — K219 Gastro-esophageal reflux disease without esophagitis: Secondary | ICD-10-CM | POA: Diagnosis not present

## 2019-04-11 DIAGNOSIS — R0600 Dyspnea, unspecified: Secondary | ICD-10-CM | POA: Diagnosis not present

## 2019-04-11 DIAGNOSIS — N189 Chronic kidney disease, unspecified: Secondary | ICD-10-CM | POA: Diagnosis not present

## 2019-04-12 DIAGNOSIS — I340 Nonrheumatic mitral (valve) insufficiency: Secondary | ICD-10-CM | POA: Diagnosis not present

## 2019-04-12 DIAGNOSIS — I10 Essential (primary) hypertension: Secondary | ICD-10-CM | POA: Diagnosis not present

## 2019-04-12 DIAGNOSIS — R600 Localized edema: Secondary | ICD-10-CM | POA: Diagnosis not present

## 2019-04-12 DIAGNOSIS — I517 Cardiomegaly: Secondary | ICD-10-CM | POA: Diagnosis not present

## 2019-04-12 DIAGNOSIS — R0600 Dyspnea, unspecified: Secondary | ICD-10-CM | POA: Diagnosis not present

## 2019-04-12 DIAGNOSIS — I700 Atherosclerosis of aorta: Secondary | ICD-10-CM | POA: Diagnosis not present

## 2019-04-16 DIAGNOSIS — K219 Gastro-esophageal reflux disease without esophagitis: Secondary | ICD-10-CM | POA: Diagnosis not present

## 2019-04-16 DIAGNOSIS — E119 Type 2 diabetes mellitus without complications: Secondary | ICD-10-CM | POA: Diagnosis not present

## 2019-04-16 DIAGNOSIS — M109 Gout, unspecified: Secondary | ICD-10-CM | POA: Diagnosis not present

## 2019-04-16 DIAGNOSIS — I10 Essential (primary) hypertension: Secondary | ICD-10-CM | POA: Diagnosis not present

## 2019-04-16 DIAGNOSIS — Z6831 Body mass index (BMI) 31.0-31.9, adult: Secondary | ICD-10-CM | POA: Diagnosis not present

## 2019-04-16 DIAGNOSIS — E782 Mixed hyperlipidemia: Secondary | ICD-10-CM | POA: Diagnosis not present

## 2019-04-16 DIAGNOSIS — R0600 Dyspnea, unspecified: Secondary | ICD-10-CM | POA: Diagnosis not present

## 2019-04-16 DIAGNOSIS — N189 Chronic kidney disease, unspecified: Secondary | ICD-10-CM | POA: Diagnosis not present

## 2019-04-23 DIAGNOSIS — N289 Disorder of kidney and ureter, unspecified: Secondary | ICD-10-CM | POA: Diagnosis not present

## 2019-04-23 DIAGNOSIS — N189 Chronic kidney disease, unspecified: Secondary | ICD-10-CM | POA: Diagnosis not present

## 2019-05-02 DIAGNOSIS — K219 Gastro-esophageal reflux disease without esophagitis: Secondary | ICD-10-CM | POA: Diagnosis not present

## 2019-05-02 DIAGNOSIS — I10 Essential (primary) hypertension: Secondary | ICD-10-CM | POA: Diagnosis not present

## 2019-05-02 DIAGNOSIS — E119 Type 2 diabetes mellitus without complications: Secondary | ICD-10-CM | POA: Diagnosis not present

## 2019-05-02 DIAGNOSIS — R0902 Hypoxemia: Secondary | ICD-10-CM | POA: Diagnosis not present

## 2019-05-07 DIAGNOSIS — Z79899 Other long term (current) drug therapy: Secondary | ICD-10-CM | POA: Diagnosis not present

## 2019-05-07 DIAGNOSIS — B559 Leishmaniasis, unspecified: Secondary | ICD-10-CM | POA: Diagnosis not present

## 2019-05-07 DIAGNOSIS — M109 Gout, unspecified: Secondary | ICD-10-CM | POA: Diagnosis not present

## 2019-05-07 DIAGNOSIS — N183 Chronic kidney disease, stage 3 (moderate): Secondary | ICD-10-CM | POA: Diagnosis not present

## 2019-05-07 DIAGNOSIS — D649 Anemia, unspecified: Secondary | ICD-10-CM | POA: Diagnosis not present

## 2019-05-07 DIAGNOSIS — I129 Hypertensive chronic kidney disease with stage 1 through stage 4 chronic kidney disease, or unspecified chronic kidney disease: Secondary | ICD-10-CM | POA: Diagnosis not present

## 2019-05-07 DIAGNOSIS — R809 Proteinuria, unspecified: Secondary | ICD-10-CM | POA: Diagnosis not present

## 2019-05-22 DIAGNOSIS — E119 Type 2 diabetes mellitus without complications: Secondary | ICD-10-CM | POA: Diagnosis not present

## 2019-05-22 DIAGNOSIS — I10 Essential (primary) hypertension: Secondary | ICD-10-CM | POA: Diagnosis not present

## 2019-05-22 DIAGNOSIS — M109 Gout, unspecified: Secondary | ICD-10-CM | POA: Diagnosis not present

## 2019-05-22 DIAGNOSIS — K219 Gastro-esophageal reflux disease without esophagitis: Secondary | ICD-10-CM | POA: Diagnosis not present

## 2019-05-22 DIAGNOSIS — E782 Mixed hyperlipidemia: Secondary | ICD-10-CM | POA: Diagnosis not present

## 2019-05-22 DIAGNOSIS — N189 Chronic kidney disease, unspecified: Secondary | ICD-10-CM | POA: Diagnosis not present

## 2019-05-22 DIAGNOSIS — Z6830 Body mass index (BMI) 30.0-30.9, adult: Secondary | ICD-10-CM | POA: Diagnosis not present

## 2019-05-22 DIAGNOSIS — R0600 Dyspnea, unspecified: Secondary | ICD-10-CM | POA: Diagnosis not present

## 2019-07-01 DIAGNOSIS — I10 Essential (primary) hypertension: Secondary | ICD-10-CM | POA: Diagnosis not present

## 2019-07-01 DIAGNOSIS — N189 Chronic kidney disease, unspecified: Secondary | ICD-10-CM | POA: Diagnosis not present

## 2019-07-01 DIAGNOSIS — E119 Type 2 diabetes mellitus without complications: Secondary | ICD-10-CM | POA: Diagnosis not present

## 2019-07-01 DIAGNOSIS — Z6831 Body mass index (BMI) 31.0-31.9, adult: Secondary | ICD-10-CM | POA: Diagnosis not present

## 2019-07-01 DIAGNOSIS — R600 Localized edema: Secondary | ICD-10-CM | POA: Diagnosis not present

## 2019-07-04 DIAGNOSIS — N189 Chronic kidney disease, unspecified: Secondary | ICD-10-CM | POA: Diagnosis not present

## 2019-07-04 DIAGNOSIS — E119 Type 2 diabetes mellitus without complications: Secondary | ICD-10-CM | POA: Diagnosis not present

## 2019-07-04 DIAGNOSIS — Z6830 Body mass index (BMI) 30.0-30.9, adult: Secondary | ICD-10-CM | POA: Diagnosis not present

## 2019-07-04 DIAGNOSIS — R600 Localized edema: Secondary | ICD-10-CM | POA: Diagnosis not present

## 2019-07-04 DIAGNOSIS — I10 Essential (primary) hypertension: Secondary | ICD-10-CM | POA: Diagnosis not present

## 2019-07-29 DIAGNOSIS — D649 Anemia, unspecified: Secondary | ICD-10-CM | POA: Diagnosis not present

## 2019-07-29 DIAGNOSIS — Z79899 Other long term (current) drug therapy: Secondary | ICD-10-CM | POA: Diagnosis not present

## 2019-07-29 DIAGNOSIS — I129 Hypertensive chronic kidney disease with stage 1 through stage 4 chronic kidney disease, or unspecified chronic kidney disease: Secondary | ICD-10-CM | POA: Diagnosis not present

## 2019-07-29 DIAGNOSIS — R809 Proteinuria, unspecified: Secondary | ICD-10-CM | POA: Diagnosis not present

## 2019-07-29 DIAGNOSIS — E1129 Type 2 diabetes mellitus with other diabetic kidney complication: Secondary | ICD-10-CM | POA: Diagnosis not present

## 2019-07-29 DIAGNOSIS — N184 Chronic kidney disease, stage 4 (severe): Secondary | ICD-10-CM | POA: Diagnosis not present

## 2019-08-02 DIAGNOSIS — N179 Acute kidney failure, unspecified: Secondary | ICD-10-CM | POA: Diagnosis not present

## 2019-08-02 DIAGNOSIS — E1129 Type 2 diabetes mellitus with other diabetic kidney complication: Secondary | ICD-10-CM | POA: Diagnosis not present

## 2019-08-02 DIAGNOSIS — E871 Hypo-osmolality and hyponatremia: Secondary | ICD-10-CM | POA: Diagnosis not present

## 2019-08-02 DIAGNOSIS — N189 Chronic kidney disease, unspecified: Secondary | ICD-10-CM | POA: Diagnosis not present

## 2019-08-02 DIAGNOSIS — E1122 Type 2 diabetes mellitus with diabetic chronic kidney disease: Secondary | ICD-10-CM | POA: Diagnosis not present

## 2019-08-02 DIAGNOSIS — E873 Alkalosis: Secondary | ICD-10-CM | POA: Diagnosis not present

## 2019-08-02 DIAGNOSIS — R809 Proteinuria, unspecified: Secondary | ICD-10-CM | POA: Diagnosis not present

## 2019-08-02 DIAGNOSIS — D631 Anemia in chronic kidney disease: Secondary | ICD-10-CM | POA: Diagnosis not present

## 2019-08-12 DIAGNOSIS — R69 Illness, unspecified: Secondary | ICD-10-CM | POA: Diagnosis not present

## 2019-08-13 DIAGNOSIS — R69 Illness, unspecified: Secondary | ICD-10-CM | POA: Diagnosis not present

## 2019-09-20 DIAGNOSIS — I10 Essential (primary) hypertension: Secondary | ICD-10-CM | POA: Diagnosis not present

## 2019-09-20 DIAGNOSIS — E119 Type 2 diabetes mellitus without complications: Secondary | ICD-10-CM | POA: Diagnosis not present

## 2019-09-20 DIAGNOSIS — R0600 Dyspnea, unspecified: Secondary | ICD-10-CM | POA: Diagnosis not present

## 2019-09-20 DIAGNOSIS — Z6830 Body mass index (BMI) 30.0-30.9, adult: Secondary | ICD-10-CM | POA: Diagnosis not present

## 2019-09-20 DIAGNOSIS — N189 Chronic kidney disease, unspecified: Secondary | ICD-10-CM | POA: Diagnosis not present

## 2019-09-20 DIAGNOSIS — M109 Gout, unspecified: Secondary | ICD-10-CM | POA: Diagnosis not present

## 2019-09-20 DIAGNOSIS — Z0001 Encounter for general adult medical examination with abnormal findings: Secondary | ICD-10-CM | POA: Diagnosis not present

## 2019-09-20 DIAGNOSIS — E782 Mixed hyperlipidemia: Secondary | ICD-10-CM | POA: Diagnosis not present

## 2019-09-30 DIAGNOSIS — E782 Mixed hyperlipidemia: Secondary | ICD-10-CM | POA: Diagnosis not present

## 2019-09-30 DIAGNOSIS — I10 Essential (primary) hypertension: Secondary | ICD-10-CM | POA: Diagnosis not present

## 2019-10-01 ENCOUNTER — Other Ambulatory Visit (HOSPITAL_COMMUNITY): Payer: Self-pay | Admitting: Nephrology

## 2019-10-01 DIAGNOSIS — I10 Essential (primary) hypertension: Secondary | ICD-10-CM

## 2019-10-04 ENCOUNTER — Ambulatory Visit (HOSPITAL_COMMUNITY): Admission: RE | Admit: 2019-10-04 | Payer: Medicare HMO | Source: Ambulatory Visit

## 2019-10-04 DIAGNOSIS — R809 Proteinuria, unspecified: Secondary | ICD-10-CM | POA: Diagnosis not present

## 2019-10-04 DIAGNOSIS — N189 Chronic kidney disease, unspecified: Secondary | ICD-10-CM | POA: Diagnosis not present

## 2019-10-04 DIAGNOSIS — E1129 Type 2 diabetes mellitus with other diabetic kidney complication: Secondary | ICD-10-CM | POA: Diagnosis not present

## 2019-10-04 DIAGNOSIS — E1122 Type 2 diabetes mellitus with diabetic chronic kidney disease: Secondary | ICD-10-CM | POA: Diagnosis not present

## 2019-10-04 DIAGNOSIS — D631 Anemia in chronic kidney disease: Secondary | ICD-10-CM | POA: Diagnosis not present

## 2019-10-09 ENCOUNTER — Other Ambulatory Visit (HOSPITAL_COMMUNITY): Payer: Self-pay | Admitting: Nephrology

## 2019-10-09 DIAGNOSIS — R0600 Dyspnea, unspecified: Secondary | ICD-10-CM

## 2019-10-30 ENCOUNTER — Other Ambulatory Visit: Payer: Self-pay

## 2019-10-30 ENCOUNTER — Ambulatory Visit (INDEPENDENT_AMBULATORY_CARE_PROVIDER_SITE_OTHER): Payer: Medicare HMO

## 2019-10-30 DIAGNOSIS — E1129 Type 2 diabetes mellitus with other diabetic kidney complication: Secondary | ICD-10-CM | POA: Diagnosis not present

## 2019-10-30 DIAGNOSIS — I5032 Chronic diastolic (congestive) heart failure: Secondary | ICD-10-CM | POA: Diagnosis not present

## 2019-10-30 DIAGNOSIS — R809 Proteinuria, unspecified: Secondary | ICD-10-CM | POA: Diagnosis not present

## 2019-10-30 DIAGNOSIS — N170 Acute kidney failure with tubular necrosis: Secondary | ICD-10-CM | POA: Diagnosis not present

## 2019-10-30 DIAGNOSIS — N189 Chronic kidney disease, unspecified: Secondary | ICD-10-CM | POA: Diagnosis not present

## 2019-10-30 DIAGNOSIS — E871 Hypo-osmolality and hyponatremia: Secondary | ICD-10-CM | POA: Diagnosis not present

## 2019-10-30 DIAGNOSIS — E1122 Type 2 diabetes mellitus with diabetic chronic kidney disease: Secondary | ICD-10-CM | POA: Diagnosis not present

## 2019-10-30 DIAGNOSIS — D631 Anemia in chronic kidney disease: Secondary | ICD-10-CM | POA: Diagnosis not present

## 2019-10-30 DIAGNOSIS — R0600 Dyspnea, unspecified: Secondary | ICD-10-CM | POA: Diagnosis not present

## 2019-10-31 DIAGNOSIS — K219 Gastro-esophageal reflux disease without esophagitis: Secondary | ICD-10-CM | POA: Diagnosis not present

## 2019-10-31 DIAGNOSIS — I10 Essential (primary) hypertension: Secondary | ICD-10-CM | POA: Diagnosis not present

## 2019-11-15 DIAGNOSIS — H52223 Regular astigmatism, bilateral: Secondary | ICD-10-CM | POA: Diagnosis not present

## 2019-11-15 DIAGNOSIS — H5203 Hypermetropia, bilateral: Secondary | ICD-10-CM | POA: Diagnosis not present

## 2019-11-15 DIAGNOSIS — H16223 Keratoconjunctivitis sicca, not specified as Sjogren's, bilateral: Secondary | ICD-10-CM | POA: Diagnosis not present

## 2019-11-15 DIAGNOSIS — E119 Type 2 diabetes mellitus without complications: Secondary | ICD-10-CM | POA: Diagnosis not present

## 2019-11-15 DIAGNOSIS — H25813 Combined forms of age-related cataract, bilateral: Secondary | ICD-10-CM | POA: Diagnosis not present

## 2019-11-15 DIAGNOSIS — H524 Presbyopia: Secondary | ICD-10-CM | POA: Diagnosis not present

## 2019-11-29 DIAGNOSIS — N529 Male erectile dysfunction, unspecified: Secondary | ICD-10-CM | POA: Diagnosis not present

## 2019-11-29 DIAGNOSIS — G3184 Mild cognitive impairment, so stated: Secondary | ICD-10-CM | POA: Diagnosis not present

## 2019-11-29 DIAGNOSIS — H269 Unspecified cataract: Secondary | ICD-10-CM | POA: Diagnosis not present

## 2019-11-29 DIAGNOSIS — E785 Hyperlipidemia, unspecified: Secondary | ICD-10-CM | POA: Diagnosis not present

## 2019-11-29 DIAGNOSIS — Z6831 Body mass index (BMI) 31.0-31.9, adult: Secondary | ICD-10-CM | POA: Diagnosis not present

## 2019-11-29 DIAGNOSIS — E669 Obesity, unspecified: Secondary | ICD-10-CM | POA: Diagnosis not present

## 2019-11-29 DIAGNOSIS — K219 Gastro-esophageal reflux disease without esophagitis: Secondary | ICD-10-CM | POA: Diagnosis not present

## 2019-11-29 DIAGNOSIS — I10 Essential (primary) hypertension: Secondary | ICD-10-CM | POA: Diagnosis not present

## 2019-11-29 DIAGNOSIS — Z87891 Personal history of nicotine dependence: Secondary | ICD-10-CM | POA: Diagnosis not present

## 2019-11-29 DIAGNOSIS — M109 Gout, unspecified: Secondary | ICD-10-CM | POA: Diagnosis not present

## 2019-12-04 DIAGNOSIS — E871 Hypo-osmolality and hyponatremia: Secondary | ICD-10-CM | POA: Diagnosis not present

## 2019-12-04 DIAGNOSIS — E1122 Type 2 diabetes mellitus with diabetic chronic kidney disease: Secondary | ICD-10-CM | POA: Diagnosis not present

## 2019-12-04 DIAGNOSIS — R809 Proteinuria, unspecified: Secondary | ICD-10-CM | POA: Diagnosis not present

## 2019-12-04 DIAGNOSIS — D631 Anemia in chronic kidney disease: Secondary | ICD-10-CM | POA: Diagnosis not present

## 2019-12-04 DIAGNOSIS — I5032 Chronic diastolic (congestive) heart failure: Secondary | ICD-10-CM | POA: Diagnosis not present

## 2019-12-04 DIAGNOSIS — N189 Chronic kidney disease, unspecified: Secondary | ICD-10-CM | POA: Diagnosis not present

## 2019-12-04 DIAGNOSIS — E1129 Type 2 diabetes mellitus with other diabetic kidney complication: Secondary | ICD-10-CM | POA: Diagnosis not present

## 2019-12-04 DIAGNOSIS — N170 Acute kidney failure with tubular necrosis: Secondary | ICD-10-CM | POA: Diagnosis not present

## 2019-12-26 DIAGNOSIS — E113392 Type 2 diabetes mellitus with moderate nonproliferative diabetic retinopathy without macular edema, left eye: Secondary | ICD-10-CM | POA: Diagnosis not present

## 2019-12-26 DIAGNOSIS — H25813 Combined forms of age-related cataract, bilateral: Secondary | ICD-10-CM | POA: Diagnosis not present

## 2019-12-26 DIAGNOSIS — H353131 Nonexudative age-related macular degeneration, bilateral, early dry stage: Secondary | ICD-10-CM | POA: Diagnosis not present

## 2019-12-27 DIAGNOSIS — I10 Essential (primary) hypertension: Secondary | ICD-10-CM | POA: Diagnosis not present

## 2019-12-27 DIAGNOSIS — E119 Type 2 diabetes mellitus without complications: Secondary | ICD-10-CM | POA: Diagnosis not present

## 2019-12-30 DIAGNOSIS — H25811 Combined forms of age-related cataract, right eye: Secondary | ICD-10-CM | POA: Diagnosis not present

## 2019-12-30 NOTE — H&P (Signed)
Surgical History & Physical  Patient Name: Andrew Adams DOB: 09-15-1946  Surgery: Cataract extraction with intraocular lens implant phacoemulsification; Right Eye  Surgeon: [name] MD Surgery Date:  01/06/2020 Pre-Op Date:  12/26/2019  HPI: A 35 Yr. old male patient -referred by Dr. Hassell Done for cataract eval 1. 1. The patient complains of difficulty when driving, which began 2 years ago. Both eyes are affected. The episode is constant and gradual. The condition's severity is moderate. Symptoms occur when the patient is driving and reading. OD worse c/w OS. Patient not able to comfortably to drive or read. This is negatively affecting the patient's quality of life. Patient ready to proceed with tx for BCVA. Patient c/o constantly tearing which makes VA even worse. "puddles" in the am. Cold air makes OU tear. No tx tried. *patient has wheezing and SOB x 1 week. s/p finalized COVID vaccines 2 weeks ago.  Medical History: Macula Degeneration Cataracts DM heme ERM with VMT OD Macula Degeneration Diabetes GERD Gout Heart Problem High Blood Pressure  Review of Systems Respiratory Shortness of Breath, wheezing x 1 week All recorded systems are negative except as noted above.  Social   Former smoker   Medication Allopurinol, Amlodipine Besylate, Atorvastatin, Furosemide, Metoprolol, Pantoprazole,   Sx/Procedures Hernia,   Drug Allergies   NKDA  History & Physical: Heent:  Cataract, Right eye NECK: supple without bruits LUNGS: lungs clear to auscultation CV: regular rate and rhythm Abdomen: soft and non-tender  Impression & Plan: Assessment: 1.  COMBINED FORMS AGE RELATED CATARACT; Both Eyes (H25.813) 2.  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  Diabetes Type 2 No retinopathy; Left Mod Without ME (E11.9, L79.8921)  Plan: 1.  Cataract accounts for the patient's decreased vision. This visual impairment is not correctable with a tolerable  change in glasses or contact lenses. Cataract surgery with an implantation of a new lens should significantly improve the visual and functional status of the patient. Discussed all risks, benefits, alternatives, and potential complications. Discussed the procedures and recovery. Patient desires to have surgery. A-scan ordered and performed today for intra-ocular lens calculations. The surgery will be performed in order to improve vision for driving, reading, and for eye examinations. Recommend phacoemulsification with intra-ocular lens. Right Eye worse - first. Dilates well - shugarcaine by protocol. 2.  Call with any worsening vision, pain, or any other concerns. 3.  Recommend good blood sugar and pressure control.

## 2020-01-01 NOTE — Patient Instructions (Signed)
Andrew Adams  01/01/2020     @PREFPERIOPPHARMACY @   Your procedure is scheduled on  01/06/2020  Report to [facility] at  [place]  Call this number if you have problems the morning of surgery:  [phone]   Remember:  Do not eat or drink after midnight.                      Take these medicines the morning of surgery with A SIP OF WATER  Allopurinol, amlodipine, metoprolol.    Do not wear jewelry, make-up or nail polish.  Do not wear lotions, powders, or perfumes. Please wear deodorant and brush your teeth.  Do not shave 48 hours prior to surgery.  Men may shave face and neck.  Do not bring valuables to the hospital.  [facility] is not responsible for any belongings or valuables.  Contacts, dentures or bridgework may not be worn into surgery.  Leave your suitcase in the car.  After surgery it may be brought to your room.  For patients admitted to the hospital, discharge time will be determined by your treatment team.  Patients discharged the day of surgery will not be allowed to drive home.   Name and phone number of your driver:   family Special instructions:  DO NOT smoke the day of your surgery.  Please read over the following fact sheets that you were given. Anesthesia Post-op Instructions and Care and Recovery After Surgery       Cataract Surgery, Care After This sheet gives you information about how to care for yourself after your procedure. Your health care provider may also give you more specific instructions. If you have problems or questions, contact your health care provider. What can I expect after the procedure? After the procedure, it is common to have:  Itching.  Discomfort.  Fluid discharge.  Sensitivity to light and to touch.  Bruising in or around the eye.  Mild blurred vision. Follow these instructions at home: Eye care   Do not touch or rub your eyes.  Protect your eyes as told by your health care provider. You may be  told to wear a protective eye shield or sunglasses.  Do not put a contact lens into the affected eye or eyes until your health care provider approves.  Keep the area around your eye clean and dry: ? Avoid swimming. ? Do not allow water to hit you directly in the face while showering. ? Keep soap and shampoo out of your eyes.  Check your eye every day for signs of infection. Watch for: ? Redness, swelling, or pain. ? Fluid, blood, or pus. ? Warmth. ? A bad smell. ? Vision that is getting worse. ? Sensitivity that is getting worse. Activity  Do not drive for 24 hours if you were given a sedative during your procedure.  Avoid strenuous activities, such as playing contact sports, for as long as told by your health care provider.  Do not drive or use heavy machinery until your health care provider approves.  Do not bend or lift heavy objects. Bending increases pressure in the eye. You can walk, climb stairs, and do light household chores.  Ask your health care provider when you can return to work. If you work in a dusty environment, you may be advised to wear protective eyewear for a period of time. General instructions  Take or apply over-the-counter and prescription medicines only as told  by your health care provider. This includes eye drops.  Keep all follow-up visits as told by your health care provider. This is important. Contact a health care provider if:  You have increased bruising around your eye.  You have pain that is not helped with medicine.  You have a fever.  You have redness, swelling, or pain in your eye.  You have fluid, blood, or pus coming from your incision.  Your vision gets worse.  Your sensitivity to light gets worse. Get help right away if:  You have sudden loss of vision.  You see flashes of light or spots (floaters).  You have severe eye pain.  You develop nausea or vomiting. Summary  After your procedure, it is common to have itching,  discomfort, bruising, fluid discharge, or sensitivity to light.  Follow instructions from your health care provider about caring for your eye after the procedure.  Do not rub your eye after the procedure. You may need to wear eye protection or sunglasses. Do not wear contact lenses. Keep the area around your eye clean and dry.  Avoid activities that require a lot of effort. These include playing sports and lifting heavy objects.  Contact a health care provider if you have increased bruising, pain that does not go away, or a fever. Get help right away if you suddenly lose your vision, see flashes of light or spots, or have severe pain in the eye. This information is not intended to replace advice given to you by your health care provider. Make sure you discuss any questions you have with your health care provider. Document Revised: 08/13/2019 Document Reviewed: 04/16/2018 Elsevier Patient Education  2020 [place] After These instructions provide you with information about caring for yourself after your procedure. Your health care provider may also give you more specific instructions. Your treatment has been planned according to current medical practices, but problems sometimes occur. Call your health care provider if you have any problems or questions after your procedure. What can I expect after the procedure? After your procedure, you may:  Feel sleepy for several hours.  Feel clumsy and have poor balance for several hours.  Feel forgetful about what happened after the procedure.  Have poor judgment for several hours.  Feel nauseous or vomit.  Have a sore throat if you had a breathing tube during the procedure. Follow these instructions at home: For at least 24 hours after the procedure:      Have a responsible adult stay with you. It is important to have someone help care for you until you are awake and alert.  Rest as needed.  Do  not: ? Participate in activities in which you could fall or become injured. ? Drive. ? Use heavy machinery. ? Drink alcohol. ? Take sleeping pills or medicines that cause drowsiness. ? Make important decisions or sign legal documents. ? Take care of children on your own. Eating and drinking  Follow the diet that is recommended by your health care provider.  If you vomit, drink water, juice, or soup when you can drink without vomiting.  Make sure you have little or no nausea before eating solid foods. General instructions  Take over-the-counter and prescription medicines only as told by your health care provider.  If you have sleep apnea, surgery and certain medicines can increase your risk for breathing problems. Follow instructions from your health care provider about wearing your sleep device: ? Anytime you are sleeping, including during  daytime naps. ? While taking prescription pain medicines, sleeping medicines, or medicines that make you drowsy.  If you smoke, do not smoke without supervision.  Keep all follow-up visits as told by your health care provider. This is important. Contact a health care provider if:  You keep feeling nauseous or you keep vomiting.  You feel light-headed.  You develop a rash.  You have a fever. Get help right away if:  You have trouble breathing. Summary  For several hours after your procedure, you may feel sleepy and have poor judgment.  Have a responsible adult stay with you for at least 24 hours or until you are awake and alert. This information is not intended to replace advice given to you by your health care provider. Make sure you discuss any questions you have with your health care provider. Document Revised: 01/15/2018 Document Reviewed: 02/07/2016 Elsevier Patient Education  [place].

## 2020-01-02 ENCOUNTER — Inpatient Hospital Stay (HOSPITAL_COMMUNITY): Admission: RE | Admit: 2020-01-02 | Payer: Medicare HMO | Source: Ambulatory Visit

## 2020-01-02 ENCOUNTER — Other Ambulatory Visit (HOSPITAL_COMMUNITY): Payer: Medicare HMO

## 2020-01-03 ENCOUNTER — Other Ambulatory Visit (HOSPITAL_COMMUNITY)
Admission: RE | Admit: 2020-01-03 | Discharge: 2020-01-03 | Disposition: A | Discharge status: Home / Self Care | Payer: Medicare HMO | Source: Ambulatory Visit | Attending: Ophthalmology | Admitting: Ophthalmology

## 2020-01-03 ENCOUNTER — Other Ambulatory Visit: Payer: Self-pay

## 2020-01-03 ENCOUNTER — Encounter (HOSPITAL_COMMUNITY): Payer: Self-pay

## 2020-01-03 ENCOUNTER — Encounter (HOSPITAL_COMMUNITY)
Admission: RE | Admit: 2020-01-03 | Discharge: 2020-01-03 | Disposition: A | Discharge status: Home / Self Care | Payer: Medicare HMO | Source: Ambulatory Visit | Attending: Ophthalmology | Admitting: Ophthalmology

## 2020-01-03 DIAGNOSIS — I491 Atrial premature depolarization: Secondary | ICD-10-CM | POA: Insufficient documentation

## 2020-01-03 DIAGNOSIS — Z01810 Encounter for preprocedural cardiovascular examination: Secondary | ICD-10-CM | POA: Diagnosis not present

## 2020-01-03 DIAGNOSIS — Z20822 Contact with and (suspected) exposure to covid-19: Secondary | ICD-10-CM | POA: Diagnosis not present

## 2020-01-03 DIAGNOSIS — Z01812 Encounter for preprocedural laboratory examination: Secondary | ICD-10-CM | POA: Insufficient documentation

## 2020-01-03 HISTORY — DX: Prediabetes: R73.03

## 2020-01-03 HISTORY — DX: Personal history of other diseases of the digestive system: Z87.19

## 2020-01-03 HISTORY — DX: Gout, unspecified: M10.9

## 2020-01-03 LAB — BASIC METABOLIC PANEL
Anion gap: 10 (ref 5–15)
BUN: 19 mg/dL (ref 8–23)
CO2: 30 mmol/L (ref 22–32)
Calcium: 9 mg/dL (ref 8.9–10.3)
Chloride: 102 mmol/L (ref 98–111)
Creatinine, Ser: 2.92 mg/dL — ABNORMAL HIGH (ref 0.61–1.24)
GFR calc Af Amer: 23 mL/min — ABNORMAL LOW (ref >60)
GFR calc non Af Amer: 20 mL/min — ABNORMAL LOW (ref >60)
Glucose, Bld: 145 mg/dL — ABNORMAL HIGH (ref 70–99)
Potassium: 3.8 mmol/L (ref 3.5–5.1)
Sodium: 142 mmol/L (ref 135–145)

## 2020-01-03 LAB — GLUCOSE, CAPILLARY: Glucose-Capillary: 133 mg/dL — ABNORMAL HIGH (ref 70–99)

## 2020-01-04 LAB — SARS CORONAVIRUS 2 (TAT 6-24 HRS): SARS Coronavirus 2: NEGATIVE

## 2020-01-06 ENCOUNTER — Ambulatory Visit (HOSPITAL_COMMUNITY)
Admission: RE | Admit: 2020-01-06 | Discharge: 2020-01-06 | Disposition: A | Discharge status: Home / Self Care | Payer: Medicare HMO | Attending: Ophthalmology | Admitting: Ophthalmology

## 2020-01-06 ENCOUNTER — Encounter (HOSPITAL_COMMUNITY): Payer: Self-pay | Admitting: Ophthalmology

## 2020-01-06 ENCOUNTER — Ambulatory Visit (HOSPITAL_COMMUNITY): Payer: Medicare HMO | Admitting: Anesthesiology

## 2020-01-06 ENCOUNTER — Encounter (HOSPITAL_COMMUNITY)
Admission: RE | Disposition: A | Discharge status: Home / Self Care | Payer: Self-pay | Source: Home / Self Care | Attending: Ophthalmology

## 2020-01-06 DIAGNOSIS — H25811 Combined forms of age-related cataract, right eye: Secondary | ICD-10-CM | POA: Insufficient documentation

## 2020-01-06 DIAGNOSIS — I10 Essential (primary) hypertension: Secondary | ICD-10-CM | POA: Diagnosis not present

## 2020-01-06 DIAGNOSIS — R062 Wheezing: Secondary | ICD-10-CM | POA: Insufficient documentation

## 2020-01-06 DIAGNOSIS — Z87891 Personal history of nicotine dependence: Secondary | ICD-10-CM | POA: Insufficient documentation

## 2020-01-06 DIAGNOSIS — K219 Gastro-esophageal reflux disease without esophagitis: Secondary | ICD-10-CM | POA: Insufficient documentation

## 2020-01-06 DIAGNOSIS — H353130 Nonexudative age-related macular degeneration, bilateral, stage unspecified: Secondary | ICD-10-CM | POA: Insufficient documentation

## 2020-01-06 DIAGNOSIS — M1990 Unspecified osteoarthritis, unspecified site: Secondary | ICD-10-CM | POA: Diagnosis not present

## 2020-01-06 DIAGNOSIS — M109 Gout, unspecified: Secondary | ICD-10-CM | POA: Insufficient documentation

## 2020-01-06 DIAGNOSIS — Z79899 Other long term (current) drug therapy: Secondary | ICD-10-CM | POA: Diagnosis not present

## 2020-01-06 DIAGNOSIS — E785 Hyperlipidemia, unspecified: Secondary | ICD-10-CM | POA: Diagnosis not present

## 2020-01-06 DIAGNOSIS — G709 Myoneural disorder, unspecified: Secondary | ICD-10-CM | POA: Diagnosis not present

## 2020-01-06 DIAGNOSIS — E1136 Type 2 diabetes mellitus with diabetic cataract: Secondary | ICD-10-CM | POA: Diagnosis present

## 2020-01-06 DIAGNOSIS — R7303 Prediabetes: Secondary | ICD-10-CM | POA: Diagnosis not present

## 2020-01-06 HISTORY — PX: CATARACT EXTRACTION W/PHACO: SHX586

## 2020-01-06 SURGERY — PHACOEMULSIFICATION, CATARACT, WITH IOL INSERTION
Anesthesia: Monitor Anesthesia Care | Site: Eye | Laterality: Right

## 2020-01-06 MED ORDER — EPINEPHRINE PF 1 MG/ML IJ SOLN
INTRAOCULAR | Status: DC | PRN
  Administered 2020-01-06: 500 mL

## 2020-01-06 MED ORDER — SODIUM HYALURONATE 23 MG/ML IO SOLN
INTRAOCULAR | Status: DC | PRN
  Administered 2020-01-06: 0.6 mL via INTRAOCULAR

## 2020-01-06 MED ORDER — TETRACAINE HCL 0.5 % OP SOLN
1.0000 [drp] | OPHTHALMIC | Status: AC | PRN
  Administered 2020-01-06 (×3): 1 [drp] via OPHTHALMIC

## 2020-01-06 MED ORDER — LIDOCAINE HCL 3.5 % OP GEL
1.0000 "application " | Freq: Once | OPHTHALMIC | Status: AC
  Administered 2020-01-06: 1 via OPHTHALMIC

## 2020-01-06 MED ORDER — LIDOCAINE HCL (PF) 1 % IJ SOLN
INTRAOCULAR | Status: DC | PRN
  Administered 2020-01-06: 1 mL via OPHTHALMIC

## 2020-01-06 MED ORDER — CYCLOPENTOLATE-PHENYLEPHRINE 0.2-1 % OP SOLN
1.0000 [drp] | OPHTHALMIC | Status: AC | PRN
  Administered 2020-01-06 (×3): 1 [drp] via OPHTHALMIC

## 2020-01-06 MED ORDER — NEOMYCIN-POLYMYXIN-DEXAMETH 3.5-10000-0.1 OP SUSP
OPHTHALMIC | Status: DC | PRN
  Administered 2020-01-06: 1 [drp] via OPHTHALMIC

## 2020-01-06 MED ORDER — POVIDONE-IODINE 5 % OP SOLN
OPHTHALMIC | Status: DC | PRN
  Administered 2020-01-06: 1 via OPHTHALMIC

## 2020-01-06 MED ORDER — BSS IO SOLN
INTRAOCULAR | Status: DC | PRN
  Administered 2020-01-06: 15 mL via INTRAOCULAR

## 2020-01-06 MED ORDER — PROVISC 10 MG/ML IO SOLN
INTRAOCULAR | Status: DC | PRN
  Administered 2020-01-06: 0.85 mL via INTRAOCULAR

## 2020-01-06 MED ORDER — PHENYLEPHRINE HCL 2.5 % OP SOLN
1.0000 [drp] | OPHTHALMIC | Status: AC | PRN
  Administered 2020-01-06 (×3): 1 [drp] via OPHTHALMIC

## 2020-01-06 SURGICAL SUPPLY — 17 items
CLOTH BEACON ORANGE TIMEOUT ST (SAFETY) ×1 IMPLANT
DEVICE MILOOP (MISCELLANEOUS) IMPLANT
EYE SHIELD UNIVERSAL CLEAR (GAUZE/BANDAGES/DRESSINGS) ×1 IMPLANT
GLOVE BIOGEL PI IND STRL 7.0 (GLOVE) IMPLANT
GLOVE BIOGEL PI INDICATOR 7.0 (GLOVE) ×2
LENS ALC ACRYL/TECN (Ophthalmic Related) ×1 IMPLANT
MILOOP DEVICE (MISCELLANEOUS)
NDL HYPO 18GX1.5 BLUNT FILL (NEEDLE) IMPLANT
NEEDLE HYPO 18GX1.5 BLUNT FILL (NEEDLE) ×2 IMPLANT
PAD ARMBOARD 7.5X6 YLW CONV (MISCELLANEOUS) ×1 IMPLANT
RING MALYGIN (MISCELLANEOUS) IMPLANT
RING MALYGIN 7.0 (MISCELLANEOUS) IMPLANT
SYR TB 1ML LL NO SAFETY (SYRINGE) ×1 IMPLANT
TAPE SURG TRANSPORE 1 IN (GAUZE/BANDAGES/DRESSINGS) IMPLANT
TAPE SURGICAL TRANSPORE 1 IN (GAUZE/BANDAGES/DRESSINGS) ×2
VISCOELASTIC ADDITIONAL (OPHTHALMIC RELATED) ×1 IMPLANT
WATER STERILE IRR 250ML POUR (IV SOLUTION) ×1 IMPLANT

## 2020-01-06 NOTE — Discharge Instructions (Signed)
Please discharge patient when stable, will follow up today with Dr. Clydie Dillen at the [facility] office immediately following discharge.  Leave shield in place until visit.  All paperwork with discharge instructions will be given at the office.  [facility] Address:  [address]  , [place] [address]  

## 2020-01-06 NOTE — Anesthesia Preprocedure Evaluation (Signed)
Anesthesia Evaluation  Patient identified by MRN, date of birth, ID band Patient awake    Reviewed: Allergy & Precautions, NPO status , Patient's Chart, lab work & pertinent test results, reviewed documented beta blocker date and time   Airway Mallampati: III  TM Distance: >3 FB Neck ROM: full    Dental  (+) Missing, Partial Lower, Edentulous Upper   Pulmonary former smoker,     + wheezing      Cardiovascular hypertension, Pt. on home beta blockers Normal cardiovascular exam     Neuro/Psych  Neuromuscular disease    GI/Hepatic Neg liver ROS, GERD  Medicated,  Endo/Other  negative endocrine ROS  Renal/GU Renal InsufficiencyRenal disease  negative genitourinary   Musculoskeletal  (+) Arthritis ,   Abdominal   Peds negative pediatric ROS (+)  Hematology negative hematology ROS (+)   Anesthesia Other Findings   Reproductive/Obstetrics negative OB ROS                             Anesthesia Physical Anesthesia Plan  ASA: III  Anesthesia Plan: MAC   Post-op Pain Management:    Induction:   PONV Risk Score and Plan: 1 and Treatment may vary due to age or medical condition  Airway Management Planned:   Additional Equipment:   Intra-op Plan:   Post-operative Plan:   Informed Consent: I have reviewed the patients History and Physical, chart, labs and discussed the procedure including the risks, benefits and alternatives for the proposed anesthesia with the patient or authorized representative who has indicated his/her understanding and acceptance.       Plan Discussed with: CRNA  Anesthesia Plan Comments:         Anesthesia Quick Evaluation

## 2020-01-06 NOTE — Op Note (Signed)
Date of procedure: 01/06/20  Pre-operative diagnosis:  Visually significant combined form age-related cataract, Right Eye (H25.811)  Post-operative diagnosis:  Visually significant combined form age-related cataract, Right Eye (H25.811)  Procedure: Removal of cataract via phacoemulsification and insertion of intra-ocular lens Wynetta Emery and [employer] DCB00  +22.0D into the capsular bag of the Right Eye  Attending surgeon: Gerda Diss. Taiya Nutting, MD, MA  Anesthesia: MAC, Topical Akten  Complications: None  Estimated Blood Loss: <18m (minimal)  Specimens: None  Implants: As above  Indications:  Visually significant age-related cataract, Right Eye  Procedure:  The patient was seen and identified in the pre-operative area. The operative eye was identified and dilated.  The operative eye was marked.  Topical anesthesia was administered to the operative eye.     The patient was then to the operative suite and placed in the supine position.  A timeout was performed confirming the patient, procedure to be performed, and all other relevant information.   The patient's face was prepped and draped in the usual fashion for intra-ocular surgery.  A lid speculum was placed into the operative eye and the surgical microscope moved into place and focused.  A superotemporal paracentesis was created using a 20 gauge paracentesis blade.  Shugarcaine was injected into the anterior chamber.  Viscoelastic was injected into the anterior chamber.  A temporal clear-corneal main wound incision was created using a 2.467mmicrokeratome.  A continuous curvilinear capsulorrhexis was initiated using an irrigating cystitome and completed using capsulorrhexis forceps.  Hydrodissection and hydrodeliniation were performed.  Viscoelastic was injected into the anterior chamber.  A phacoemulsification handpiece and a chopper as a second instrument were used to remove the nucleus and epinucleus. The irrigation/aspiration handpiece was  used to remove any remaining cortical material.   The capsular bag was reinflated with viscoelastic, checked, and found to be intact.  The intraocular lens was inserted into the capsular bag.  The irrigation/aspiration handpiece was used to remove any remaining viscoelastic.  The clear corneal wound and paracentesis wounds were then hydrated and checked with Weck-Cels to be watertight.  The lid-speculum and drape was removed, and the patient's face was cleaned with a wet and dry 4x4.  Maxitrol was instilled in the eye before a clear shield was taped over the eye. The patient was taken to the post-operative care unit in good condition, having tolerated the procedure well.  Post-Op Instructions: The [facility] post-operative evaluation and will receive all other orders and instructions.

## 2020-01-06 NOTE — Anesthesia Postprocedure Evaluation (Signed)
Anesthesia Post Note  Patient: Andrew Adams  Procedure(s) Performed: CATARACT EXTRACTION PHACO AND INTRAOCULAR LENS PLACEMENT (IOC) (CDE: 6.48) (Right Eye)  Patient location during evaluation: Short Stay Anesthesia Type: MAC Level of consciousness: awake and alert Pain management: pain level controlled Vital Signs Assessment: post-procedure vital signs reviewed and stable Respiratory status: spontaneous breathing Cardiovascular status: stable Postop Assessment: no apparent nausea or vomiting Anesthetic complications: no     Last Vitals:  Vitals:   01/06/20 0737 01/06/20 0750  BP: (!) 202/112 (!) 196/110  Pulse: 67 64  Resp: (!) 24 18  Temp: 36.9 C   SpO2: 91% 100%    Last Pain:  Vitals:   01/06/20 0737  TempSrc: Oral  PainSc: 0-No pain                 [name]

## 2020-01-06 NOTE — Interval H&P Note (Signed)
History and Physical Interval Note: The H and P was reviewed and updated. The patient was examined.  No changes were found after exam.  The surgical eye was marked.  01/06/2020 8:34 AM  Andrew Adams  has presented today for surgery, with the diagnosis of Nuclear sclerotic cataract - Right eye.  The various methods of treatment have been discussed with the patient and family. After consideration of risks, benefits and other options for treatment, the patient has consented to  Procedure(s) with comments: CATARACT EXTRACTION PHACO AND INTRAOCULAR LENS PLACEMENT (IOC) (Right) - right as a surgical intervention.  The patient's history has been reviewed, patient examined, no change in status, stable for surgery.  I have reviewed the patient's chart and labs.  Questions were answered to the patient's satisfaction.     [name]

## 2020-01-06 NOTE — Transfer of Care (Signed)
Immediate Anesthesia Transfer of Care Note  Patient: Andrew Adams  Procedure(s) Performed: CATARACT EXTRACTION PHACO AND INTRAOCULAR LENS PLACEMENT (IOC) (CDE: 6.48) (Right Eye)  Patient Location: Short Stay  Anesthesia Type:MAC  Level of Consciousness: awake, alert , oriented and patient cooperative  Airway & Oxygen Therapy: Patient Spontanous Breathing  Post-op Assessment: Report given to RN and Post -op Vital signs reviewed and stable  Post vital signs: Reviewed and stable  Last Vitals:  Vitals Value Taken Time  BP    Temp    Pulse    Resp    SpO2      Last Pain:  Vitals:   01/06/20 0737  TempSrc: Oral  PainSc: 0-No pain      Patients Stated Pain Goal: 8 (51/02/58 [id number])  Complications: No apparent anesthesia complications

## 2020-01-07 NOTE — Addendum Note (Signed)
Addendum  created 01/07/20 0848 by Vista Deck, CRNA   Charge Capture section accepted

## 2020-01-13 DIAGNOSIS — H25812 Combined forms of age-related cataract, left eye: Secondary | ICD-10-CM | POA: Diagnosis not present

## 2020-01-13 NOTE — H&P (Signed)
Surgical History & Physical  Patient Name: Andrew Adams DOB: 1946/07/02  Surgery: Cataract extraction with intraocular lens implant phacoemulsification; Left Eye  Surgeon: [name] MD Surgery Date:  01/20/2020 Pre-Op Date:  01/13/2020  HPI: A 66 Yr. old male patient 1. 1. The patient complains of difficulty when driving, which began 2 years ago. The left eye is affected. The episode is gradual. The condition's severity increased since last visit. Symptoms occur when the patient is driving and outside. This is negatively affecting the patient's quality of life. 2. The patient is returning after cataract post-op. The right eye is affected. Status post cataract post-op, which began 1 week ago: Since the last visit, the affected area feels improvement. The patient's vision is improved. Patient is following medication instructions. HPI was performed by [name] .  Medical History: Macula Degeneration Cataracts DM heme ERM with VMT OD Diabetes GERD Gout Heart Problem High Blood Pressure  Review of Systems Respiratory Shortness of Breath, wheezing x 1 week All recorded systems are negative except as noted above.  Social   Former smoker  Medication Prednisolone-gatiflox-bromfenac,  Allopurinol, Amlodipine Besylate, Atorvastatin, Furosemide, Metoprolol, Pantoprazole,  Sx/Procedures Phaco c IOL OD,  Hernia,   Drug Allergies   NKDA  History & Physical: Heent:  Cataract, Left eye NECK: supple without bruits LUNGS: lungs clear to auscultation CV: regular rate and rhythm Abdomen: soft and non-tender  Impression & Plan: Assessment: 1.  COMBINED FORMS AGE RELATED CATARACT; , Left Eye (H25.812) 2.  CATARACT EXTRACTION STATUS; Right Eye (Z98.41)  Plan: 1.  Cataract accounts for the patient's decreased vision. This visual impairment is not correctable with a tolerable change in glasses or contact lenses. Cataract surgery with an implantation of a new lens should  significantly improve the visual and functional status of the patient. Discussed all risks, benefits, alternatives, and potential complications. Discussed the procedures and recovery. Patient desires to have surgery. A-scan ordered and performed today for intra-ocular lens calculations. The surgery will be performed in order to improve vision for driving, reading, and for eye examinations. Recommend phacoemulsification with intra-ocular lens. Left Eye. Surgery required to correct imbalance of vision. Dilates well - shugarcaine by protocol. 2.  1 week after cataract surgery. Doing well with improved vision and normal eye pressure. Call with any problems or concerns. Continue Gati-Brom-Pred 2x/day for 3 more weeks.

## 2020-01-14 ENCOUNTER — Encounter (HOSPITAL_COMMUNITY): Payer: Self-pay

## 2020-01-14 ENCOUNTER — Other Ambulatory Visit: Payer: Self-pay

## 2020-01-15 ENCOUNTER — Encounter (HOSPITAL_COMMUNITY)
Admission: RE | Admit: 2020-01-15 | Discharge: 2020-01-15 | Disposition: A | Discharge status: Home / Self Care | Payer: Medicare HMO | Source: Ambulatory Visit | Attending: Ophthalmology | Admitting: Ophthalmology

## 2020-01-15 DIAGNOSIS — R0600 Dyspnea, unspecified: Secondary | ICD-10-CM | POA: Diagnosis not present

## 2020-01-15 DIAGNOSIS — K219 Gastro-esophageal reflux disease without esophagitis: Secondary | ICD-10-CM | POA: Diagnosis not present

## 2020-01-15 DIAGNOSIS — M109 Gout, unspecified: Secondary | ICD-10-CM | POA: Diagnosis not present

## 2020-01-15 DIAGNOSIS — Z6831 Body mass index (BMI) 31.0-31.9, adult: Secondary | ICD-10-CM | POA: Diagnosis not present

## 2020-01-15 DIAGNOSIS — E119 Type 2 diabetes mellitus without complications: Secondary | ICD-10-CM | POA: Diagnosis not present

## 2020-01-15 DIAGNOSIS — N189 Chronic kidney disease, unspecified: Secondary | ICD-10-CM | POA: Diagnosis not present

## 2020-01-15 DIAGNOSIS — I10 Essential (primary) hypertension: Secondary | ICD-10-CM | POA: Diagnosis not present

## 2020-01-15 DIAGNOSIS — E782 Mixed hyperlipidemia: Secondary | ICD-10-CM | POA: Diagnosis not present

## 2020-01-16 ENCOUNTER — Other Ambulatory Visit (HOSPITAL_COMMUNITY)
Admission: RE | Admit: 2020-01-16 | Discharge: 2020-01-16 | Disposition: A | Discharge status: Home / Self Care | Payer: Medicare HMO | Source: Ambulatory Visit | Attending: Ophthalmology | Admitting: Ophthalmology

## 2020-01-16 ENCOUNTER — Other Ambulatory Visit: Payer: Self-pay

## 2020-01-16 DIAGNOSIS — Z01812 Encounter for preprocedural laboratory examination: Secondary | ICD-10-CM | POA: Diagnosis not present

## 2020-01-16 DIAGNOSIS — Z20822 Contact with and (suspected) exposure to covid-19: Secondary | ICD-10-CM | POA: Insufficient documentation

## 2020-01-16 MED ORDER — LACTATED RINGERS IV SOLN: INTRAVENOUS | Status: DC

## 2020-01-17 LAB — SARS CORONAVIRUS 2 (TAT 6-24 HRS): SARS Coronavirus 2: NEGATIVE

## 2020-01-20 ENCOUNTER — Ambulatory Visit (HOSPITAL_COMMUNITY): Payer: Medicare HMO | Admitting: Anesthesiology

## 2020-01-20 ENCOUNTER — Encounter (HOSPITAL_COMMUNITY): Payer: Self-pay | Admitting: Ophthalmology

## 2020-01-20 ENCOUNTER — Ambulatory Visit (HOSPITAL_COMMUNITY)
Admission: RE | Admit: 2020-01-20 | Discharge: 2020-01-20 | Disposition: A | Discharge status: Home / Self Care | Payer: Medicare HMO | Attending: Ophthalmology | Admitting: Ophthalmology

## 2020-01-20 ENCOUNTER — Encounter (HOSPITAL_COMMUNITY)
Admission: RE | Disposition: A | Discharge status: Home / Self Care | Payer: Self-pay | Source: Home / Self Care | Attending: Ophthalmology

## 2020-01-20 DIAGNOSIS — N189 Chronic kidney disease, unspecified: Secondary | ICD-10-CM | POA: Diagnosis not present

## 2020-01-20 DIAGNOSIS — H25812 Combined forms of age-related cataract, left eye: Secondary | ICD-10-CM | POA: Insufficient documentation

## 2020-01-20 DIAGNOSIS — H2512 Age-related nuclear cataract, left eye: Secondary | ICD-10-CM | POA: Diagnosis not present

## 2020-01-20 DIAGNOSIS — M109 Gout, unspecified: Secondary | ICD-10-CM | POA: Insufficient documentation

## 2020-01-20 DIAGNOSIS — I10 Essential (primary) hypertension: Secondary | ICD-10-CM | POA: Diagnosis not present

## 2020-01-20 DIAGNOSIS — Z87891 Personal history of nicotine dependence: Secondary | ICD-10-CM | POA: Insufficient documentation

## 2020-01-20 DIAGNOSIS — E1136 Type 2 diabetes mellitus with diabetic cataract: Secondary | ICD-10-CM | POA: Diagnosis not present

## 2020-01-20 DIAGNOSIS — K219 Gastro-esophageal reflux disease without esophagitis: Secondary | ICD-10-CM | POA: Diagnosis not present

## 2020-01-20 HISTORY — PX: CATARACT EXTRACTION W/PHACO: SHX586

## 2020-01-20 LAB — GLUCOSE, CAPILLARY: Glucose-Capillary: 104 mg/dL — ABNORMAL HIGH (ref 70–99)

## 2020-01-20 SURGERY — PHACOEMULSIFICATION, CATARACT, WITH IOL INSERTION
Anesthesia: Monitor Anesthesia Care | Site: Eye | Laterality: Left

## 2020-01-20 MED ORDER — NEOMYCIN-POLYMYXIN-DEXAMETH 3.5-10000-0.1 OP SUSP
OPHTHALMIC | Status: DC | PRN
  Administered 2020-01-20: 1 [drp] via OPHTHALMIC

## 2020-01-20 MED ORDER — TETRACAINE HCL 0.5 % OP SOLN
1.0000 [drp] | OPHTHALMIC | Status: AC | PRN
  Administered 2020-01-20 (×3): 1 [drp] via OPHTHALMIC

## 2020-01-20 MED ORDER — SODIUM HYALURONATE 23 MG/ML IO SOLN
INTRAOCULAR | Status: DC | PRN
  Administered 2020-01-20: 0.6 mL via INTRAOCULAR

## 2020-01-20 MED ORDER — BSS IO SOLN
INTRAOCULAR | Status: DC | PRN
  Administered 2020-01-20: 15 mL via INTRAOCULAR

## 2020-01-20 MED ORDER — LIDOCAINE HCL 3.5 % OP GEL
1.0000 "application " | Freq: Once | OPHTHALMIC | Status: AC
  Administered 2020-01-20: 1 via OPHTHALMIC

## 2020-01-20 MED ORDER — PROVISC 10 MG/ML IO SOLN
INTRAOCULAR | Status: DC | PRN
  Administered 2020-01-20: 0.85 mL via INTRAOCULAR

## 2020-01-20 MED ORDER — MIDAZOLAM HCL 2 MG/2ML IJ SOLN
INTRAMUSCULAR | Status: AC
  Filled 2020-01-20: qty 2

## 2020-01-20 MED ORDER — POVIDONE-IODINE 5 % OP SOLN
OPHTHALMIC | Status: DC | PRN
  Administered 2020-01-20: 1 via OPHTHALMIC

## 2020-01-20 MED ORDER — PHENYLEPHRINE HCL 2.5 % OP SOLN
1.0000 [drp] | OPHTHALMIC | Status: AC | PRN
  Administered 2020-01-20 (×3): 1 [drp] via OPHTHALMIC

## 2020-01-20 MED ORDER — EPINEPHRINE PF 1 MG/ML IJ SOLN
INTRAOCULAR | Status: DC | PRN
  Administered 2020-01-20: 500 mL

## 2020-01-20 MED ORDER — LIDOCAINE HCL (PF) 1 % IJ SOLN
INTRAOCULAR | Status: DC | PRN
  Administered 2020-01-20: 1 mL via OPHTHALMIC

## 2020-01-20 MED ORDER — EPINEPHRINE PF 1 MG/ML IJ SOLN
INTRAMUSCULAR | Status: AC
  Filled 2020-01-20: qty 2

## 2020-01-20 MED ORDER — CYCLOPENTOLATE-PHENYLEPHRINE 0.2-1 % OP SOLN
1.0000 [drp] | OPHTHALMIC | Status: AC | PRN
  Administered 2020-01-20 (×3): 1 [drp] via OPHTHALMIC

## 2020-01-20 SURGICAL SUPPLY — 13 items
CLOTH BEACON ORANGE TIMEOUT ST (SAFETY) ×1 IMPLANT
EYE SHIELD UNIVERSAL CLEAR (GAUZE/BANDAGES/DRESSINGS) ×1 IMPLANT
GLOVE BIOGEL PI IND STRL 7.0 (GLOVE) IMPLANT
GLOVE BIOGEL PI INDICATOR 7.0 (GLOVE) ×2
LENS ALC ACRYL/TECN (Ophthalmic Related) ×1 IMPLANT
NDL HYPO 18GX1.5 BLUNT FILL (NEEDLE) IMPLANT
NEEDLE HYPO 18GX1.5 BLUNT FILL (NEEDLE) ×2 IMPLANT
PAD ARMBOARD 7.5X6 YLW CONV (MISCELLANEOUS) ×1 IMPLANT
SYR TB 1ML LL NO SAFETY (SYRINGE) ×1 IMPLANT
TAPE SURG TRANSPORE 1 IN (GAUZE/BANDAGES/DRESSINGS) IMPLANT
TAPE SURGICAL TRANSPORE 1 IN (GAUZE/BANDAGES/DRESSINGS) ×2
VISCOELASTIC ADDITIONAL (OPHTHALMIC RELATED) ×1 IMPLANT
WATER STERILE IRR 250ML POUR (IV SOLUTION) ×1 IMPLANT

## 2020-01-20 NOTE — Op Note (Signed)
Date of procedure: 01/20/20  Pre-operative diagnosis: Visually significant age-related combined cataract, Left Eye (H25.812)  Post-operative diagnosis: Visually significant age-related combined cataract, Left Eye (H25.812)  Procedure: Removal of cataract via phacoemulsification and insertion of intra-ocular lens Wynetta Emery and [place]  +22.5D into the capsular bag of the Left Eye  Attending surgeon: Gerda Diss. Terry Abila, MD, MA  Anesthesia: MAC, Topical Akten  Complications: None  Estimated Blood Loss: <43m (minimal)  Specimens: None  Implants: As above  Indications:  Visually significant age-related cataract, Left Eye  Procedure:  The patient was seen and identified in the pre-operative area. The operative eye was identified and dilated.  The operative eye was marked.  Topical anesthesia was administered to the operative eye.     The patient was then to the operative suite and placed in the supine position.  A timeout was performed confirming the patient, procedure to be performed, and all other relevant information.   The patient's face was prepped and draped in the usual fashion for intra-ocular surgery.  A lid speculum was placed into the operative eye and the surgical microscope moved into place and focused.  An inferotemporal paracentesis was created using a 20 gauge paracentesis blade.  Shugarcaine was injected into the anterior chamber.  Viscoelastic was injected into the anterior chamber.  A temporal clear-corneal main wound incision was created using a 2.440mmicrokeratome.  A continuous curvilinear capsulorrhexis was initiated using an irrigating cystitome and completed using capsulorrhexis forceps.  Hydrodissection and hydrodeliniation were performed.  Viscoelastic was injected into the anterior chamber.  A phacoemulsification handpiece and a chopper as a second instrument were used to remove the nucleus and epinucleus. The irrigation/aspiration handpiece was used to remove  any remaining cortical material.   The capsular bag was reinflated with viscoelastic, checked, and found to be intact.  The intraocular lens was inserted into the capsular bag.  The irrigation/aspiration handpiece was used to remove any remaining viscoelastic.  The clear corneal wound and paracentesis wounds were then hydrated and checked with Weck-Cels to be watertight.  The lid-speculum and drape was removed, and the patient's face was cleaned with a wet and dry 4x4.  Maxitrol was instilled in the eye before a clear shield was taped over the eye. The patient was taken to the post-operative care unit in good condition, having tolerated the procedure well.  Post-Op Instructions: [facility] and will receive all other orders and instructions.

## 2020-01-20 NOTE — Interval H&P Note (Signed)
History and Physical Interval Note: The H and P was reviewed and updated. The patient was examined.  No changes were found after exam.  The surgical eye was marked.  01/20/2020 12:18 PM  Andrew Adams  has presented today for surgery, with the diagnosis of Nuclear sclerotic cataract - Left eye.  The various methods of treatment have been discussed with the patient and family. After consideration of risks, benefits and other options for treatment, the patient has consented to  Procedure(s) with comments: CATARACT EXTRACTION PHACO AND INTRAOCULAR LENS PLACEMENT ([address]) (Left) - left as a surgical intervention.  The patient's history has been reviewed, patient examined, no change in status, stable for surgery.  I have reviewed the patient's chart and labs.  Questions were answered to the patient's satisfaction.     [name]

## 2020-01-20 NOTE — Addendum Note (Signed)
Addendum  created 01/20/20 1354 by Ollen Bowl, CRNA   Charge Capture section accepted

## 2020-01-20 NOTE — Discharge Instructions (Signed)
Please discharge patient when stable, will follow up today with Dr. Pietrina Jagodzinski at the [facility] office immediately following discharge.  Leave shield in place until visit.  All paperwork with discharge instructions will be given at the office.  [facility] Address:  [address]  [place],  [address]  

## 2020-01-20 NOTE — Anesthesia Postprocedure Evaluation (Signed)
Anesthesia Post Note  Patient: Andrew Adams  Procedure(s) Performed: CATARACT EXTRACTION PHACO AND INTRAOCULAR LENS PLACEMENT (IOC) (Left Eye)  Patient location during evaluation: Phase II Anesthesia Type: MAC Level of consciousness: awake and alert Pain management: pain level controlled Vital Signs Assessment: post-procedure vital signs reviewed and stable Respiratory status: spontaneous breathing, nonlabored ventilation, respiratory function stable and patient connected to nasal cannula oxygen Cardiovascular status: stable and blood pressure returned to baseline Postop Assessment: no apparent nausea or vomiting Anesthetic complications: no     Last Vitals:  Vitals:   01/20/20 1125 01/20/20 1130  BP:    Pulse:    Resp: (!) 22 (!) 22  Temp:    SpO2: 100% 100%    Last Pain:  Vitals:   01/20/20 1115  PainSc: 0-No pain                 Aubrielle Stroud

## 2020-01-20 NOTE — Transfer of Care (Signed)
Immediate Anesthesia Transfer of Care Note  Patient: Andrew Adams  Procedure(s) Performed: CATARACT EXTRACTION PHACO AND INTRAOCULAR LENS PLACEMENT (IOC) (Left Eye)  Patient Location: PACU  Anesthesia Type:MAC  Level of Consciousness: awake, alert , oriented and patient cooperative  Airway & Oxygen Therapy: Patient Spontanous Breathing  Post-op Assessment: Report given to RN, Post -op Vital signs reviewed and stable and Patient moving all extremities X 4  Post vital signs: Reviewed and stable  Last Vitals:  Vitals Value Taken Time  BP    Temp    Pulse    Resp    SpO2      Last Pain:  Vitals:   01/20/20 1115  PainSc: 0-No pain         Complications: No apparent anesthesia complications

## 2020-01-20 NOTE — Anesthesia Preprocedure Evaluation (Signed)
Anesthesia Evaluation  Patient identified by MRN, date of birth, ID band Patient awake    Reviewed: Allergy & Precautions, H&P , NPO status , Patient's Chart, lab work & pertinent test results, reviewed documented beta blocker date and time   Airway Mallampati: III  TM Distance: >3 FB Neck ROM: full    Dental  (+) Missing   Pulmonary former smoker,    Pulmonary exam normal        Cardiovascular hypertension, Normal cardiovascular exam     Neuro/Psych  Neuromuscular disease negative psych ROS   GI/Hepatic Neg liver ROS, GERD  ,  Endo/Other  negative endocrine ROS  Renal/GU CRFRenal disease  negative genitourinary   Musculoskeletal   Abdominal   Peds  Hematology negative hematology ROS (+)   Anesthesia Other Findings   Reproductive/Obstetrics negative OB ROS                             Anesthesia Physical Anesthesia Plan  ASA: III  Anesthesia Plan: MAC   Post-op Pain Management:    Induction:   PONV Risk Score and Plan: 1  Airway Management Planned:   Additional Equipment:   Intra-op Plan:   Post-operative Plan:   Informed Consent: I have reviewed the patients History and Physical, chart, labs and discussed the procedure including the risks, benefits and alternatives for the proposed anesthesia with the patient or authorized representative who has indicated his/her understanding and acceptance.       Plan Discussed with:   Anesthesia Plan Comments:         Anesthesia Quick Evaluation

## 2020-01-22 DIAGNOSIS — I10 Essential (primary) hypertension: Secondary | ICD-10-CM | POA: Diagnosis not present

## 2020-01-22 DIAGNOSIS — M109 Gout, unspecified: Secondary | ICD-10-CM | POA: Diagnosis not present

## 2020-01-22 DIAGNOSIS — E119 Type 2 diabetes mellitus without complications: Secondary | ICD-10-CM | POA: Diagnosis not present

## 2020-01-22 DIAGNOSIS — N189 Chronic kidney disease, unspecified: Secondary | ICD-10-CM | POA: Diagnosis not present

## 2020-01-22 DIAGNOSIS — Z6831 Body mass index (BMI) 31.0-31.9, adult: Secondary | ICD-10-CM | POA: Diagnosis not present

## 2020-01-22 DIAGNOSIS — E782 Mixed hyperlipidemia: Secondary | ICD-10-CM | POA: Diagnosis not present

## 2020-01-22 DIAGNOSIS — Z6832 Body mass index (BMI) 32.0-32.9, adult: Secondary | ICD-10-CM | POA: Diagnosis not present

## 2020-01-22 DIAGNOSIS — K219 Gastro-esophageal reflux disease without esophagitis: Secondary | ICD-10-CM | POA: Diagnosis not present

## 2020-01-22 DIAGNOSIS — D649 Anemia, unspecified: Secondary | ICD-10-CM | POA: Diagnosis not present

## 2020-01-22 DIAGNOSIS — R0600 Dyspnea, unspecified: Secondary | ICD-10-CM | POA: Diagnosis not present

## 2020-01-27 DIAGNOSIS — D631 Anemia in chronic kidney disease: Secondary | ICD-10-CM | POA: Diagnosis not present

## 2020-01-27 DIAGNOSIS — N184 Chronic kidney disease, stage 4 (severe): Secondary | ICD-10-CM | POA: Diagnosis not present

## 2020-01-27 DIAGNOSIS — R809 Proteinuria, unspecified: Secondary | ICD-10-CM | POA: Diagnosis not present

## 2020-01-27 DIAGNOSIS — Z79899 Other long term (current) drug therapy: Secondary | ICD-10-CM | POA: Diagnosis not present

## 2020-01-27 DIAGNOSIS — E559 Vitamin D deficiency, unspecified: Secondary | ICD-10-CM | POA: Diagnosis not present

## 2020-01-29 DIAGNOSIS — I10 Essential (primary) hypertension: Secondary | ICD-10-CM | POA: Diagnosis not present

## 2020-01-29 DIAGNOSIS — E7849 Other hyperlipidemia: Secondary | ICD-10-CM | POA: Diagnosis not present

## 2020-01-31 DIAGNOSIS — E873 Alkalosis: Secondary | ICD-10-CM | POA: Diagnosis not present

## 2020-01-31 DIAGNOSIS — R0602 Shortness of breath: Secondary | ICD-10-CM | POA: Diagnosis not present

## 2020-01-31 DIAGNOSIS — E1122 Type 2 diabetes mellitus with diabetic chronic kidney disease: Secondary | ICD-10-CM | POA: Diagnosis not present

## 2020-01-31 DIAGNOSIS — E1129 Type 2 diabetes mellitus with other diabetic kidney complication: Secondary | ICD-10-CM | POA: Diagnosis not present

## 2020-01-31 DIAGNOSIS — N189 Chronic kidney disease, unspecified: Secondary | ICD-10-CM | POA: Diagnosis not present

## 2020-01-31 DIAGNOSIS — D631 Anemia in chronic kidney disease: Secondary | ICD-10-CM | POA: Diagnosis not present

## 2020-01-31 DIAGNOSIS — E871 Hypo-osmolality and hyponatremia: Secondary | ICD-10-CM | POA: Diagnosis not present

## 2020-01-31 DIAGNOSIS — I5032 Chronic diastolic (congestive) heart failure: Secondary | ICD-10-CM | POA: Diagnosis not present

## 2020-01-31 DIAGNOSIS — R809 Proteinuria, unspecified: Secondary | ICD-10-CM | POA: Diagnosis not present

## 2020-02-03 DIAGNOSIS — D631 Anemia in chronic kidney disease: Secondary | ICD-10-CM | POA: Diagnosis not present

## 2020-02-03 DIAGNOSIS — R0602 Shortness of breath: Secondary | ICD-10-CM | POA: Diagnosis not present

## 2020-02-03 DIAGNOSIS — E1122 Type 2 diabetes mellitus with diabetic chronic kidney disease: Secondary | ICD-10-CM | POA: Diagnosis not present

## 2020-02-03 DIAGNOSIS — N189 Chronic kidney disease, unspecified: Secondary | ICD-10-CM | POA: Diagnosis not present

## 2020-02-10 ENCOUNTER — Inpatient Hospital Stay (HOSPITAL_COMMUNITY)
Admission: EM | Admit: 2020-02-10 | Discharge: 2020-02-15 | DRG: 193 | Disposition: A | Discharge status: Home Health | Payer: Medicare HMO | Attending: Family Medicine | Admitting: Family Medicine

## 2020-02-10 ENCOUNTER — Other Ambulatory Visit: Payer: Self-pay

## 2020-02-10 ENCOUNTER — Encounter (HOSPITAL_COMMUNITY): Payer: Self-pay | Admitting: *Deleted

## 2020-02-10 ENCOUNTER — Emergency Department (HOSPITAL_COMMUNITY): Payer: Medicare HMO

## 2020-02-10 DIAGNOSIS — J9620 Acute and chronic respiratory failure, unspecified whether with hypoxia or hypercapnia: Secondary | ICD-10-CM | POA: Diagnosis not present

## 2020-02-10 DIAGNOSIS — Z87891 Personal history of nicotine dependence: Secondary | ICD-10-CM

## 2020-02-10 DIAGNOSIS — F1020 Alcohol dependence, uncomplicated: Secondary | ICD-10-CM | POA: Diagnosis present

## 2020-02-10 DIAGNOSIS — J440 Chronic obstructive pulmonary disease with acute lower respiratory infection: Secondary | ICD-10-CM | POA: Diagnosis not present

## 2020-02-10 DIAGNOSIS — D696 Thrombocytopenia, unspecified: Secondary | ICD-10-CM | POA: Diagnosis present

## 2020-02-10 DIAGNOSIS — R531 Weakness: Secondary | ICD-10-CM

## 2020-02-10 DIAGNOSIS — I5031 Acute diastolic (congestive) heart failure: Secondary | ICD-10-CM | POA: Diagnosis not present

## 2020-02-10 DIAGNOSIS — R0600 Dyspnea, unspecified: Secondary | ICD-10-CM | POA: Diagnosis present

## 2020-02-10 DIAGNOSIS — J189 Pneumonia, unspecified organism: Secondary | ICD-10-CM | POA: Diagnosis not present

## 2020-02-10 DIAGNOSIS — K219 Gastro-esophageal reflux disease without esophagitis: Secondary | ICD-10-CM | POA: Diagnosis not present

## 2020-02-10 DIAGNOSIS — Z86711 Personal history of pulmonary embolism: Secondary | ICD-10-CM

## 2020-02-10 DIAGNOSIS — I5032 Chronic diastolic (congestive) heart failure: Secondary | ICD-10-CM | POA: Diagnosis not present

## 2020-02-10 DIAGNOSIS — I509 Heart failure, unspecified: Secondary | ICD-10-CM

## 2020-02-10 DIAGNOSIS — D631 Anemia in chronic kidney disease: Secondary | ICD-10-CM | POA: Diagnosis present

## 2020-02-10 DIAGNOSIS — J9601 Acute respiratory failure with hypoxia: Secondary | ICD-10-CM | POA: Diagnosis not present

## 2020-02-10 DIAGNOSIS — J9622 Acute and chronic respiratory failure with hypercapnia: Secondary | ICD-10-CM | POA: Diagnosis not present

## 2020-02-10 DIAGNOSIS — R0602 Shortness of breath: Secondary | ICD-10-CM | POA: Diagnosis not present

## 2020-02-10 DIAGNOSIS — J9621 Acute and chronic respiratory failure with hypoxia: Secondary | ICD-10-CM | POA: Diagnosis present

## 2020-02-10 DIAGNOSIS — F1010 Alcohol abuse, uncomplicated: Secondary | ICD-10-CM | POA: Diagnosis present

## 2020-02-10 DIAGNOSIS — Z833 Family history of diabetes mellitus: Secondary | ICD-10-CM

## 2020-02-10 DIAGNOSIS — I110 Hypertensive heart disease with heart failure: Secondary | ICD-10-CM | POA: Diagnosis not present

## 2020-02-10 DIAGNOSIS — I130 Hypertensive heart and chronic kidney disease with heart failure and stage 1 through stage 4 chronic kidney disease, or unspecified chronic kidney disease: Secondary | ICD-10-CM | POA: Diagnosis present

## 2020-02-10 DIAGNOSIS — J449 Chronic obstructive pulmonary disease, unspecified: Secondary | ICD-10-CM | POA: Diagnosis not present

## 2020-02-10 DIAGNOSIS — M109 Gout, unspecified: Secondary | ICD-10-CM | POA: Diagnosis present

## 2020-02-10 DIAGNOSIS — N179 Acute kidney failure, unspecified: Secondary | ICD-10-CM | POA: Diagnosis not present

## 2020-02-10 DIAGNOSIS — N1832 Chronic kidney disease, stage 3b: Secondary | ICD-10-CM | POA: Diagnosis present

## 2020-02-10 DIAGNOSIS — E785 Hyperlipidemia, unspecified: Secondary | ICD-10-CM | POA: Diagnosis not present

## 2020-02-10 DIAGNOSIS — Z20822 Contact with and (suspected) exposure to covid-19: Secondary | ICD-10-CM | POA: Diagnosis present

## 2020-02-10 DIAGNOSIS — J441 Chronic obstructive pulmonary disease with (acute) exacerbation: Secondary | ICD-10-CM | POA: Diagnosis not present

## 2020-02-10 DIAGNOSIS — Z8249 Family history of ischemic heart disease and other diseases of the circulatory system: Secondary | ICD-10-CM | POA: Diagnosis not present

## 2020-02-10 DIAGNOSIS — I10 Essential (primary) hypertension: Secondary | ICD-10-CM | POA: Diagnosis not present

## 2020-02-10 DIAGNOSIS — R69 Illness, unspecified: Secondary | ICD-10-CM | POA: Diagnosis not present

## 2020-02-10 DIAGNOSIS — R0609 Other forms of dyspnea: Secondary | ICD-10-CM | POA: Diagnosis present

## 2020-02-10 HISTORY — DX: Other specified postprocedural states: Z98.890

## 2020-02-10 LAB — ETHANOL: Alcohol, Ethyl (B): <10 mg/dL (ref <10)

## 2020-02-10 LAB — COMPREHENSIVE METABOLIC PANEL
ALT: 12 U/L (ref 0–44)
AST: 24 U/L (ref 15–41)
Albumin: 4 g/dL (ref 3.5–5.0)
Alkaline Phosphatase: 30 U/L — ABNORMAL LOW (ref 38–126)
Anion gap: 11 (ref 5–15)
BUN: 24 mg/dL — ABNORMAL HIGH (ref 8–23)
CO2: 28 mmol/L (ref 22–32)
Calcium: 8.6 mg/dL — ABNORMAL LOW (ref 8.9–10.3)
Chloride: 96 mmol/L — ABNORMAL LOW (ref 98–111)
Creatinine, Ser: 2.47 mg/dL — ABNORMAL HIGH (ref 0.61–1.24)
GFR calc Af Amer: 28 mL/min — ABNORMAL LOW (ref >60)
GFR calc non Af Amer: 24 mL/min — ABNORMAL LOW (ref >60)
Glucose, Bld: 107 mg/dL — ABNORMAL HIGH (ref 70–99)
Potassium: 3.9 mmol/L (ref 3.5–5.1)
Sodium: 135 mmol/L (ref 135–145)
Total Bilirubin: 0.5 mg/dL (ref 0.3–1.2)
Total Protein: 7.3 g/dL (ref 6.5–8.1)

## 2020-02-10 LAB — CBC WITH DIFFERENTIAL/PLATELET
Abs Immature Granulocytes: 0.03 10*3/uL (ref 0.00–0.07)
Basophils Absolute: 0 10*3/uL (ref 0.0–0.1)
Basophils Relative: 1 %
Eosinophils Absolute: 0.1 10*3/uL (ref 0.0–0.5)
Eosinophils Relative: 3 %
HCT: 33.3 % — ABNORMAL LOW (ref 39.0–52.0)
Hemoglobin: 10.6 g/dL — ABNORMAL LOW (ref 13.0–17.0)
Immature Granulocytes: 1 %
Lymphocytes Relative: 26 %
Lymphs Abs: 1.1 10*3/uL (ref 0.7–4.0)
MCH: 29 pg (ref 26.0–34.0)
MCHC: 31.8 g/dL (ref 30.0–36.0)
MCV: 91 fL (ref 80.0–100.0)
Monocytes Absolute: 0.4 10*3/uL (ref 0.1–1.0)
Monocytes Relative: 10 %
Neutro Abs: 2.6 10*3/uL (ref 1.7–7.7)
Neutrophils Relative %: 59 %
Platelets: 213 10*3/uL (ref 150–400)
RBC: 3.66 MIL/uL — ABNORMAL LOW (ref 4.22–5.81)
RDW: 14.5 % (ref 11.5–15.5)
WBC: 4.3 10*3/uL (ref 4.0–10.5)
nRBC: 0 % (ref 0.0–0.2)

## 2020-02-10 LAB — POC SARS CORONAVIRUS 2 AG -  ED: SARS Coronavirus 2 Ag: NEGATIVE

## 2020-02-10 LAB — TROPONIN I (HIGH SENSITIVITY)
Troponin I (High Sensitivity): 11 ng/L (ref <18)
Troponin I (High Sensitivity): 10 ng/L (ref <18)

## 2020-02-10 LAB — URINALYSIS, ROUTINE W REFLEX MICROSCOPIC
Bilirubin Urine: NEGATIVE
Glucose, UA: NEGATIVE mg/dL
Hgb urine dipstick: NEGATIVE
Ketones, ur: NEGATIVE mg/dL
Leukocytes,Ua: NEGATIVE
Nitrite: NEGATIVE
Protein, ur: NEGATIVE mg/dL
Specific Gravity, Urine: 1.005 (ref 1.005–1.030)
pH: 5 (ref 5.0–8.0)

## 2020-02-10 LAB — PROCALCITONIN: Procalcitonin: <0.1 ng/mL

## 2020-02-10 LAB — RESPIRATORY PANEL BY RT PCR (FLU A&B, COVID)
Influenza A by PCR: NEGATIVE
Influenza B by PCR: NEGATIVE
SARS Coronavirus 2 by RT PCR: NEGATIVE

## 2020-02-10 LAB — BRAIN NATRIURETIC PEPTIDE: B Natriuretic Peptide: 537 pg/mL — ABNORMAL HIGH (ref 0.0–100.0)

## 2020-02-10 LAB — MAGNESIUM: Magnesium: 2.1 mg/dL (ref 1.7–2.4)

## 2020-02-10 MED ORDER — THIAMINE HCL 100 MG PO TABS
100.0000 mg | ORAL_TABLET | Freq: Every day | ORAL | Status: DC
  Administered 2020-02-10 – 2020-02-15 (×6): 100 mg via ORAL
  Filled 2020-02-10 (×6): qty 1

## 2020-02-10 MED ORDER — FUROSEMIDE 40 MG PO TABS
40.0000 mg | ORAL_TABLET | Freq: Every day | ORAL | Status: DC
  Administered 2020-02-11: 40 mg via ORAL
  Filled 2020-02-10: qty 1

## 2020-02-10 MED ORDER — FUROSEMIDE 10 MG/ML IJ SOLN
40.0000 mg | Freq: Once | INTRAMUSCULAR | Status: AC
  Administered 2020-02-10: 40 mg via INTRAVENOUS
  Filled 2020-02-10: qty 4

## 2020-02-10 MED ORDER — PANTOPRAZOLE SODIUM 40 MG PO TBEC
40.0000 mg | DELAYED_RELEASE_TABLET | Freq: Every day | ORAL | Status: DC
  Administered 2020-02-11 – 2020-02-15 (×5): 40 mg via ORAL
  Filled 2020-02-10 (×5): qty 1

## 2020-02-10 MED ORDER — SODIUM CHLORIDE 0.9 % IV SOLN
500.0000 mg | INTRAVENOUS | Status: DC
  Administered 2020-02-11 – 2020-02-13 (×3): 500 mg via INTRAVENOUS
  Filled 2020-02-10 (×3): qty 500

## 2020-02-10 MED ORDER — SODIUM CHLORIDE 0.9 % IV SOLN
1.0000 g | Freq: Once | INTRAVENOUS | Status: AC
  Administered 2020-02-10: 1 g via INTRAVENOUS
  Filled 2020-02-10: qty 10

## 2020-02-10 MED ORDER — ACETAMINOPHEN 325 MG PO TABS
650.0000 mg | ORAL_TABLET | Freq: Four times a day (QID) | ORAL | Status: DC | PRN
  Administered 2020-02-12: 650 mg via ORAL
  Filled 2020-02-10: qty 2

## 2020-02-10 MED ORDER — ALLOPURINOL 100 MG PO TABS
100.0000 mg | ORAL_TABLET | Freq: Every day | ORAL | Status: DC
  Administered 2020-02-11 – 2020-02-15 (×5): 100 mg via ORAL
  Filled 2020-02-10 (×7): qty 1

## 2020-02-10 MED ORDER — SODIUM CHLORIDE 0.9% FLUSH
3.0000 mL | Freq: Two times a day (BID) | INTRAVENOUS | Status: DC
  Administered 2020-02-10 – 2020-02-15 (×10): 3 mL via INTRAVENOUS

## 2020-02-10 MED ORDER — SODIUM CHLORIDE 0.9 % IV SOLN
1.0000 g | INTRAVENOUS | Status: DC
  Administered 2020-02-11 – 2020-02-14 (×4): 1 g via INTRAVENOUS
  Filled 2020-02-10 (×4): qty 10

## 2020-02-10 MED ORDER — AMLODIPINE BESYLATE 5 MG PO TABS
5.0000 mg | ORAL_TABLET | Freq: Every day | ORAL | Status: DC
  Administered 2020-02-11 – 2020-02-12 (×2): 5 mg via ORAL
  Filled 2020-02-10 (×2): qty 1

## 2020-02-10 MED ORDER — SPIRITUS FRUMENTI
1.0000 | Freq: Three times a day (TID) | ORAL | Status: DC
  Administered 2020-02-11 – 2020-02-12 (×4): 1 via ORAL
  Filled 2020-02-10 (×11): qty 1

## 2020-02-10 MED ORDER — FOLIC ACID 1 MG PO TABS
1.0000 mg | ORAL_TABLET | Freq: Every day | ORAL | Status: DC
  Administered 2020-02-11 – 2020-02-15 (×5): 1 mg via ORAL
  Filled 2020-02-10 (×5): qty 1

## 2020-02-10 MED ORDER — HEPARIN SODIUM (PORCINE) 5000 UNIT/ML IJ SOLN
5000.0000 [IU] | Freq: Three times a day (TID) | INTRAMUSCULAR | Status: DC
  Administered 2020-02-10 – 2020-02-11 (×3): 5000 [IU] via SUBCUTANEOUS
  Filled 2020-02-10 (×2): qty 1

## 2020-02-10 MED ORDER — ALBUTEROL SULFATE HFA 108 (90 BASE) MCG/ACT IN AERS: 1.0000 | INHALATION_SPRAY | RESPIRATORY_TRACT | Status: DC | PRN

## 2020-02-10 MED ORDER — ATORVASTATIN CALCIUM 40 MG PO TABS
40.0000 mg | ORAL_TABLET | Freq: Every day | ORAL | Status: DC
  Administered 2020-02-11 – 2020-02-15 (×5): 40 mg via ORAL
  Filled 2020-02-10 (×5): qty 1

## 2020-02-10 MED ORDER — FENOFIBRATE 160 MG PO TABS
160.0000 mg | ORAL_TABLET | Freq: Every day | ORAL | Status: DC
  Administered 2020-02-11 – 2020-02-15 (×5): 160 mg via ORAL
  Filled 2020-02-10 (×7): qty 1

## 2020-02-10 MED ORDER — METOPROLOL TARTRATE 50 MG PO TABS
50.0000 mg | ORAL_TABLET | Freq: Two times a day (BID) | ORAL | Status: DC
  Administered 2020-02-11 – 2020-02-15 (×9): 50 mg via ORAL
  Filled 2020-02-10 (×9): qty 1

## 2020-02-10 MED ORDER — CHLORHEXIDINE GLUCONATE CLOTH 2 % EX PADS
6.0000 | MEDICATED_PAD | Freq: Every day | CUTANEOUS | Status: DC
  Administered 2020-02-11 – 2020-02-14 (×4): 6 via TOPICAL

## 2020-02-10 MED ORDER — ALBUTEROL SULFATE HFA 108 (90 BASE) MCG/ACT IN AERS
8.0000 | INHALATION_SPRAY | Freq: Once | RESPIRATORY_TRACT | Status: AC
  Administered 2020-02-10: 8 via RESPIRATORY_TRACT
  Filled 2020-02-10: qty 6.7

## 2020-02-10 MED ORDER — SODIUM CHLORIDE 0.9 % IV SOLN
500.0000 mg | Freq: Once | INTRAVENOUS | Status: AC
  Administered 2020-02-10: 500 mg via INTRAVENOUS
  Filled 2020-02-10: qty 500

## 2020-02-10 MED ORDER — ACETAMINOPHEN 650 MG RE SUPP: 650.0000 mg | Freq: Four times a day (QID) | RECTAL | Status: DC | PRN

## 2020-02-10 MED ORDER — ADULT MULTIVITAMIN W/MINERALS CH
1.0000 | ORAL_TABLET | Freq: Every day | ORAL | Status: DC
  Administered 2020-02-11 – 2020-02-15 (×5): 1 via ORAL
  Filled 2020-02-10 (×5): qty 1

## 2020-02-10 NOTE — ED Provider Notes (Addendum)
[facility] EMERGENCY DEPARTMENT Provider Note   CSN: [id number] Arrival date & time: 02/10/20  1218     History Chief Complaint  Patient presents with  . Shortness of Breath    Andrew Adams is a 74 y.o. male.  The history is provided by the patient. The history is limited by a language barrier. A language interpreter was used.  Shortness of Breath Severity:  Moderate Onset quality:  Gradual Timing:  Constant Progression:  Worsening Chronicity:  New Relieved by:  Nothing Worsened by:  Nothing Ineffective treatments:  None tried Associated symptoms: cough    Pt complains of progressive shortness of breath.  Pt reports shortness of breath began several days ago but became severe today.  Pt has been coughing.  He denies covid exposure     Past Medical History:  Diagnosis Date  . Borderline diabetic   . GERD (gastroesophageal reflux disease)   . Gout   . Gouty arthritis   . H/O small bowel obstruction   . Hyperlipidemia   . Hypertension   . PE (pulmonary embolism)     Patient Active Problem List   Diagnosis Date Noted  . Varicose veins of bilateral lower extremities with other complications 38/07/1750  . Lumbar disc herniation 01/27/2014  . CKD (chronic kidney disease) stage 2, GFR 60-89 ml/min 01/09/2014  . Sciatica 01/09/2014  . Acute back pain 01/09/2014  . Hypertension 04/12/2013  . Hyperlipidemia 04/12/2013  . GERD (gastroesophageal reflux disease) 04/12/2013  . Palpitation 02/06/2013    Past Surgical History:  Procedure Laterality Date  . ABDOMINAL SURGERY  2103   Smal bowel obstruction   . ABDOMINAL SURGERY  1981   Perforated large intestine  . CATARACT EXTRACTION W/PHACO Right 01/06/2020   Procedure: CATARACT EXTRACTION PHACO AND INTRAOCULAR LENS PLACEMENT (IOC) (CDE: 6.48);  Surgeon: [name], MD;  Location: [facility];  Service: Ophthalmology;  Laterality: Right;  . CATARACT EXTRACTION W/PHACO Left 01/20/2020   Procedure: CATARACT EXTRACTION  PHACO AND INTRAOCULAR LENS PLACEMENT (IOC);  Surgeon: [name], MD;  Location: [facility];  Service: Ophthalmology;  Laterality: Left;  CDE: 5.86       Family History  Problem Relation Age of Onset  . Hypertension Sister   . Diabetes Sister   . Diabetes Sister   . Hypertension Mother   . Diabetes Mother   . Hypertension Father   . Ulcers Father     Social History   Tobacco Use  . Smoking status: Former Smoker    Quit date: 02/07/1995    Years since quitting: 25.0  . Smokeless tobacco: Never Used  Substance Use Topics  . Alcohol use: Yes    Comment: occasional   . Drug use: No    Home Medications Prior to Admission medications   Medication Sig Start Date End Date Taking? Authorizing Provider  allopurinol (ZYLOPRIM) 100 MG tablet Take 1 tablet (100 mg total) by mouth daily. 02/24/14  Yes Martin, Mary-Margaret, FNP  amLODipine (NORVASC) 10 MG tablet Take 5 mg by mouth daily.  12/20/19  Yes [provider]  atorvastatin (LIPITOR) 40 MG tablet Take 1 tablet (40 mg total) by mouth daily. 02/24/14  Yes Martin, Mary-Margaret, FNP  fenofibrate (TRICOR) 145 MG tablet Take 145 mg by mouth daily.   Yes [provider]  furosemide (LASIX) 40 MG tablet Take 40 mg by mouth.   Yes [provider]  metoprolol (LOPRESSOR) 50 MG tablet Take 1 tablet (50 mg total) by mouth 2 (two) times daily.  02/24/14  Yes Martin, Mary-Margaret, FNP  pantoprazole (PROTONIX) 40 MG tablet Take 1 tablet (40 mg total) by mouth daily. 02/24/14  Yes Martin, Mary-Margaret, FNP  losartan (COZAAR) 50 MG tablet Take 50 mg by mouth daily.     [provider]    Allergies    Patient has no known allergies.  Review of Systems   Review of Systems  Respiratory: Positive for cough and shortness of breath.   All other systems reviewed and are negative.   Physical Exam Updated Vital Signs BP (!) 164/94   Pulse 74   Temp 98 F (36.7 C)   Resp (!) 21   Ht 5\' 9"  (1.753 m)   SpO2 100%    BMI 31.01 kg/m   Physical Exam Vitals and nursing note reviewed.  Constitutional:      Appearance: He is well-developed.  HENT:     Head: Normocephalic and atraumatic.  Eyes:     Conjunctiva/sclera: Conjunctivae normal.  Cardiovascular:     Rate and Rhythm: Normal rate and regular rhythm.     Heart sounds: No murmur.  Pulmonary:     Effort: Pulmonary effort is normal. No respiratory distress.     Breath sounds: Decreased breath sounds present.  Chest:     Chest wall: No mass or deformity.  Abdominal:     Palpations: Abdomen is soft.     Tenderness: There is no abdominal tenderness.  Musculoskeletal:     Cervical back: Neck supple.     Right lower leg: No edema.     Left lower leg: No edema.  Skin:    General: Skin is warm and dry.  Neurological:     General: No focal deficit present.     Mental Status: He is alert.     ED Results / Procedures / Treatments   Labs (all labs ordered are listed, but only abnormal results are displayed) Labs Reviewed  CBC WITH DIFFERENTIAL/PLATELET - Abnormal; Notable for the following components:      Result Value   RBC 3.66 (*)    Hemoglobin 10.6 (*)    HCT 33.3 (*)    All other components within normal limits  COMPREHENSIVE METABOLIC PANEL - Abnormal; Notable for the following components:   Chloride 96 (*)    Glucose, Bld 107 (*)    BUN 24 (*)    Creatinine, Ser 2.47 (*)    Calcium 8.6 (*)    Alkaline Phosphatase 30 (*)    GFR calc non Af Amer 24 (*)    GFR calc Af Amer 28 (*)    All other components within normal limits  BRAIN NATRIURETIC PEPTIDE - Abnormal; Notable for the following components:   B Natriuretic Peptide 537.0 (*)    All other components within normal limits  SARS CORONAVIRUS 2 (TAT 6-24 HRS)  POC SARS CORONAVIRUS 2 AG -  ED  TROPONIN I (HIGH SENSITIVITY)  TROPONIN I (HIGH SENSITIVITY)    EKG EKG Interpretation  Date/Time:  Monday February 10 2020 12:41:13 EDT Ventricular Rate:  73 PR Interval:    QRS  Duration: 92 QT Interval:  431 QTC Calculation: 479 R Axis:   94 Text Interpretation: Sinus rhythm Right axis deviation Borderline repolarization abnormality Borderline prolonged QT interval baseline wander Confirmed by Davonna Belling [phone]) on 02/10/2020 12:43:27 PM   Radiology DG Chest Port 1 View  Result Date: 02/10/2020 CLINICAL DATA:  Shortness of breath. EXAM: PORTABLE CHEST 1 VIEW COMPARISON:  02/03/2020 FINDINGS: The cardiac silhouette is  borderline enlarged. Lung volumes are lower than on the prior study. Mild peribronchial thickening is mildly more prominent than on the prior study, and there are scattered ill-defined opacities bilaterally. A small left pleural effusion is questioned. No pneumothorax is identified. No acute osseous abnormality is seen. IMPRESSION: Mild peribronchial thickening with scattered ill-defined opacities bilaterally which may reflect infection (including atypical and viral etiologies). Electronically Signed   By: Logan Bores M.D.   On: 02/10/2020 13:20    Procedures .Critical Care Performed by: Fransico Meadow, PA-C Authorized by: Fransico Meadow, PA-C   Critical care provider statement:    Critical care time (minutes):  45   Critical care start time:  02/12/2020 1:30 PM   Critical care end time:  02/12/2020 3:30 PM   Critical care was necessary to treat or prevent imminent or life-threatening deterioration of the following conditions:  Circulatory failure and respiratory failure   Critical care was time spent personally by me on the following activities:  Discussions with consultants, evaluation of patient's response to treatment, examination of patient, interpretation of cardiac output measurements, ordering and performing treatments and interventions, ordering and review of laboratory studies, ordering and review of radiographic studies, pulse oximetry, re-evaluation of patient's condition and review of old charts   I assumed direction of critical care  for this patient from another provider in my specialty: no     (including critical care time)  Medications Ordered in ED Medications  albuterol (VENTOLIN HFA) 108 (90 Base) MCG/ACT inhaler 8 puff (8 puffs Inhalation Given 02/10/20 1506)    ED Course  I have reviewed the triage vital signs and the nursing notes.  Pertinent labs & imaging results that were available during my care of the patient were reviewed by me and considered in my medical decision making (see chart for details).    MDM Rules/Calculators/A&P                      MDM:  Pt's 02 sat 86 on room air.  Pt placed on 02. At 4 liters.  Pt more comfortable.  Pt given albuterol 8 puffs.  Rapid covid is negative.  Chest xray shows possible pneumonia/atypical vs viral.  BNp is elevated.  Pt counseled on results.  Pt given lasix 40 mg Iv.  Rocephin and Zithromax.  Pt reports some relief at rest with 02 and albuterol   I discussed with Dr. Eugenia Pancoast who will see for admisission  MDM Number of Diagnoses or Management Options Acute congestive heart failure, unspecified heart failure type [facility]): new, needed workup Community acquired pneumonia, unspecified laterality: new, needed workup SOB (shortness of breath): new, needed workup   Amount and/or Complexity of Data Reviewed Clinical lab tests: ordered and reviewed Tests in the radiology section of CPT: ordered and reviewed Tests in the medicine section of CPT: ordered and reviewed Decide to obtain previous medical records or to obtain history from someone other than the patient: yes Obtain history from someone other than the patient: yes Review and summarize past medical records: yes Discuss the patient with other providers: yes  Risk of Complications, Morbidity, and/or Mortality Presenting problems: high Diagnostic procedures: high Management options: high  Critical Care Total time providing critical care: 30-74 minutes  Patient Progress Patient progress:  improved  Final Clinical Impression(s) / ED Diagnoses Final diagnoses:  Acute congestive heart failure, unspecified heart failure type ([place])  Community acquired pneumonia, unspecified laterality  SOB (shortness of breath)    Rx /  DC Orders ED Discharge Orders    None       Sidney Ace 02/10/20 1646    Davonna Belling, MD 02/12/20 1553    Fransico Meadow, PA-C 02/24/20 Yevette Edwards    Davonna Belling, MD 02/28/20 [phone]

## 2020-02-10 NOTE — H&P (Addendum)
History and Physical    PLEASE NOTE THAT DRAGON DICTATION SOFTWARE WAS USED IN THE CONSTRUCTION OF THIS NOTE.   Andrew Adams XBL:[id number] DOB: Jan 25, 1946 DOA: 02/10/2020  PCP: Lavella Lemons, PA Patient coming from: home   I have personally briefly reviewed patient's old medical records in [place]  Chief Complaint: Shortness of breath  HPI: Andrew Adams is a 74 y.o. male with medical history significant for chronic diastolic heart failure, chronic alcohol abuse, hypertension, hyperlipidemia, who is admitted to [facility] on 02/10/2020 with acute hypoxic respiratory failure due to suspected community-acquired pneumonia after presenting from home to [facility] Emergency Department complaining of shortness of breath.   Reports approximately 1 week of progressive shortness of breath associated with new onset nonproductive cough.  He denies any associated orthopnea, PND, or recent worsening of his chronic bilateral lower extremity edema.  Denies any associated hemoptysis, calf tenderness, lower extremity erythema.  Denies any recent trauma or travel.  Denies any associated wheezing.  Denies any recent chest pain, diaphoresis, palpitations, nausea, vomiting.  Also denies any recent diarrhea, melena, or hematochezia.  He acknowledges a history of chronic heart failure, and reports good compliance with his home Lasix, without any recent dose adjustments to this diuretic.  He also conveys generalized weakness over the last 3 to 4 days, in the absence of any acute focal weakness.  Of note, most recent echocardiogram, which was performed in December 2020, showed mild LVH, LVEF 55 to 30%, grade 1 diastolic dysfunction, no evidence of focal wall motion abnormalities.  Echo also showed moderately dilated left atrium and mild mitral regurgitation.  The patient acknowledges that he is a former smoker, having smoked 1 pack/day for 20 to 25 years, before completely quitting smoking in 1996 without any  subsequent resumption thereof.  He denies any known underlying chronic pulmonary pathology, including no known underlying emphysema.  Denies any baseline supplemental oxygen requirements.  Denies any associated subjective fever, chills, rigors, or generalized myalgias. Denies any recent headache, neck stiffness, rhinitis, rhinorrhea, sore throat, or rash. No known COVID-19 exposures. Denies dysuria, gross hematuria, or change in urinary urgency/frequency.    Acknowledges a history of chronic alcohol abuse in which she reports typical daily consumption of 1 pint of rum, with most recent alcohol consumption occurring on Saturday, 02/08/2020.  He reports that he has no interest in discontinuation of his alcohol consumption at this time.  Denies any history of alcohol withdrawal symptoms when he has previously abstained from alcohol consumption.     ED Course:  Vital signs in the ED were notable for the following: Temperature max 98.0; heart rate 74-85; blood pressure 154/97 - 164/94; respiratory rate 20-25; initial oxygen saturation to be 86% on room air, which is improved to 99 to 100% on 4 L nasal cannula.  Labs were notable for the following: CMP notable for the following: Sodium 135, potassium 3.9, bicarbonate 28, BUN 24, creatinine 2.47 relative to most recent prior value of 2.91 01/03/2020, S1 who 2, and liver enzymes were found to be within normal limits.  BNP noted to be 537, with no prior BNP value available for point comparison.  Initial high-sensitivity troponin I found to be 11, with subsequent value found to be trending down to 10.  CBC notable for white blood cell count of 4300 with 59% neutrophils, hemoglobin 10.6.  Nasopharyngeal COVID-19 PCR was obtained in the ED this evening, with result currently pending.  Chest x-ray showed evidence of bilateral airspace opacities,  potentially consistent with atypical pneumonia in the absence of any evidence of edema or pneumothorax.  EKG showed sinus  rhythm with PAC and ventricular rate of 75, with nonspecific T wave inversion in leads III and V1, of which to inversion in V1 was unchanged relative to most recent prior EKG from 01/03/2020.  This evening's EKG also showed no evidence of ST changes, including no evidence of ST elevation.  While in the ED, the following were administered: Azithromycin 500 mg IV x1, Rocephin 1 g IV x1, Lasix 40 mg IV x1, and albuterol inhaler x8 puffs.     Review of Systems: As per HPI otherwise 10 point review of systems negative.   Past Medical History:  Diagnosis Date  . Borderline diabetic   . GERD (gastroesophageal reflux disease)   . Gout   . Gouty arthritis   . H/O small bowel obstruction   . Hyperlipidemia   . Hypertension   . PE (pulmonary embolism)     Past Surgical History:  Procedure Laterality Date  . ABDOMINAL SURGERY  2103   Smal bowel obstruction   . ABDOMINAL SURGERY  1981   Perforated large intestine  . CATARACT EXTRACTION W/PHACO Right 01/06/2020   Procedure: CATARACT EXTRACTION PHACO AND INTRAOCULAR LENS PLACEMENT (IOC) (CDE: 6.48);  Surgeon: [name], MD;  Location: [facility];  Service: Ophthalmology;  Laterality: Right;  . CATARACT EXTRACTION W/PHACO Left 01/20/2020   Procedure: CATARACT EXTRACTION PHACO AND INTRAOCULAR LENS PLACEMENT (IOC);  Surgeon: [name], MD;  Location: [facility];  Service: Ophthalmology;  Laterality: Left;  CDE: 5.86    Social History:  reports that he quit smoking about 25 years ago. He has never used smokeless tobacco. He reports current alcohol use. He reports that he does not use drugs.   No Known Allergies  Family History  Problem Relation Age of Onset  . Hypertension Sister   . Diabetes Sister   . Diabetes Sister   . Hypertension Mother   . Diabetes Mother   . Hypertension Father   . Ulcers Father      Prior to Admission medications   Medication Sig Start Date End Date Taking? Authorizing Provider  allopurinol (ZYLOPRIM) 100 MG  tablet Take 1 tablet (100 mg total) by mouth daily. 02/24/14  Yes Martin, Mary-Margaret, FNP  amLODipine (NORVASC) 10 MG tablet Take 5 mg by mouth daily.  12/20/19  Yes [provider]  atorvastatin (LIPITOR) 40 MG tablet Take 1 tablet (40 mg total) by mouth daily. 02/24/14  Yes Martin, Mary-Margaret, FNP  fenofibrate (TRICOR) 145 MG tablet Take 145 mg by mouth daily.   Yes [provider]  furosemide (LASIX) 40 MG tablet Take 40 mg by mouth.   Yes [provider]  metoprolol (LOPRESSOR) 50 MG tablet Take 1 tablet (50 mg total) by mouth 2 (two) times daily. 02/24/14  Yes Martin, Mary-Margaret, FNP  pantoprazole (PROTONIX) 40 MG tablet Take 1 tablet (40 mg total) by mouth daily. 02/24/14  Yes Martin, Mary-Margaret, FNP  losartan (COZAAR) 50 MG tablet Take 50 mg by mouth daily.     [provider]     Objective    Physical Exam: Vitals:   02/10/20 1233 02/10/20 1445 02/10/20 1500 02/10/20 1530  BP: (!) 195/67  (!) 169/87 (!) 164/94  Pulse: 71 76 76 74  Resp: (!) 28 (!) 23 (!) 22 (!) 21  Temp: 98 F (36.7 C)     SpO2: 96% 100% 100% 100%  Height:  General: appears to be stated age; alert, oriented Skin: warm, dry, no rash Head:  AT/[place] Eyes:  PEARL b/l, EOMI Mouth:  Oral mucosa membranes appear moist, normal dentition Neck: supple; trachea midline Heart:  RRR; did not appreciate any M/R/G Lungs: Slightly diminished bibasilar breath sounds, but otherwise CTAB; did not appreciate any wheezes, rales, or rhonchi Abdomen: + BS; soft, ND, NT Vascular: 2+ pedal pulses b/l; 2+ radial pulses b/l Extremities: Trace edema in the bilateral lower extremities; no muscle wasting Neuro: strength and sensation intact in upper and lower extremities b/l   Labs on Admission: I have personally reviewed following labs and imaging studies  CBC: Recent Labs  Lab 02/10/20 1242  WBC 4.3  NEUTROABS 2.6  HGB 10.6*  HCT 33.3*  MCV 91.0  PLT 027   Basic  Metabolic Panel: Recent Labs  Lab 02/10/20 1242  NA 135  K 3.9  CL 96*  CO2 28  GLUCOSE 107*  BUN 24*  CREATININE 2.47*  CALCIUM 8.6*   GFR: CrCl cannot be calculated (Unknown ideal weight.). Liver Function Tests: Recent Labs  Lab 02/10/20 1242  AST 24  ALT 12  ALKPHOS 30*  BILITOT 0.5  PROT 7.3  ALBUMIN 4.0   No results for input(s): LIPASE, AMYLASE in the last 168 hours. No results for input(s): AMMONIA in the last 168 hours. Coagulation Profile: No results for input(s): INR, PROTIME in the last 168 hours. Cardiac Enzymes: No results for input(s): CKTOTAL, CKMB, CKMBINDEX, TROPONINI in the last 168 hours. BNP (last 3 results) No results for input(s): PROBNP in the last 8760 hours. HbA1C: No results for input(s): HGBA1C in the last 72 hours. CBG: No results for input(s): GLUCAP in the last 168 hours. Lipid Profile: No results for input(s): CHOL, HDL, LDLCALC, TRIG, CHOLHDL, LDLDIRECT in the last 72 hours. Thyroid Function Tests: No results for input(s): TSH, T4TOTAL, FREET4, T3FREE, THYROIDAB in the last 72 hours. Anemia Panel: No results for input(s): VITAMINB12, FOLATE, FERRITIN, TIBC, IRON, RETICCTPCT in the last 72 hours. Urine analysis:    Component Value Date/Time   BILIRUBINUR negative 01/20/2014 1032   PROTEINUR negative 01/20/2014 1032   UROBILINOGEN negative 01/20/2014 1032   NITRITE negative 01/20/2014 1032   LEUKOCYTESUR Negative 01/20/2014 1032    Radiological Exams on Admission: DG Chest Port 1 View  Result Date: 02/10/2020 CLINICAL DATA:  Shortness of breath. EXAM: PORTABLE CHEST 1 VIEW COMPARISON:  02/03/2020 FINDINGS: The cardiac silhouette is borderline enlarged. Lung volumes are lower than on the prior study. Mild peribronchial thickening is mildly more prominent than on the prior study, and there are scattered ill-defined opacities bilaterally. A small left pleural effusion is questioned. No pneumothorax is identified. No acute osseous  abnormality is seen. IMPRESSION: Mild peribronchial thickening with scattered ill-defined opacities bilaterally which may reflect infection (including atypical and viral etiologies). Electronically Signed   By: [place] Bores M.D.   On: 02/10/2020 13:20     EKG: Independently reviewed, with result as described above.    Assessment/Plan   Andrew Adams is a 74 y.o. male with medical history significant for chronic diastolic heart failure, chronic alcohol abuse, hypertension, hyperlipidemia, who is admitted to [facility] on 02/10/2020 with acute hypoxic respiratory failure due to suspected community-acquired pneumonia after presenting from home to [facility] Emergency Department complaining of shortness of breath.    Principal Problem:   Acute respiratory failure with hypoxia (HCC) Active Problems:   Hypertension   Hyperlipidemia   GERD (gastroesophageal reflux disease)  CAP (community acquired pneumonia)   Generalized weakness   SOB (shortness of breath)   Chronic alcohol abuse   #) Acute hypoxic respiratory failure due to suspected community-acquired pneumonia: Diagnosis on the basis of presenting 1 week of progressive shortness of breath associated new onset nonproductive cough, with presenting oxygen saturation noted to be in the mid 80s on room air, subsequently improving into the high 90s on 4 L nasal cannula in the absence of any known baseline supplemental oxygen requirements, while presenting chest x-ray showed evidence of bilateral airspace opacities suggestive of atypical pneumonia in the absence of any evidence of edema or pneumothorax.  The differential also includes COVID-19 pneumonia, although radiographic appearance of airspace opacities, as opposed to interstitial opacities, would be less typical of COVID-19 pneumonia.  Nasopharyngeal COVID-19 PCR checked in the ED this evening, with result currently pending.  For now, will proceed with treatment of community-acquired pneumonia, but  with close monitoring for result of aforementioned nasopharyngeal COVID-19 result, which, if subsequently found a positive, would likely result in modification to current treatment regimen, including discontinuation of current antibiotics as well as initiation of Decadron as well as remdesivir.  Considered the possibility of acutely decompensated heart failure in the setting of a known history of chronic diastolic heart failure, although this possibility appears to be less likely given patient's report of no recent orthopnea, PND, or worsening of peripheral edema, while chest x-ray shows no evidence of pulmonary edema.  ACS appears to be less likely as the patient denies any associated chest pain, while high-sensitivity troponin I x2 found to be negative, and presenting EKG shows no evidence of acute ischemic changes.  The patient does have a distant history of pulmonary embolism, although clinically, acute PE appears to be less likely at this time.  Azithromycin Rocephin administered in the ED this evening.  Of note, while presentation is associated with mild tachypnea, no other sirs criteria met at this time for the patient to be considered septic.  The differential also includes potential exacerbation of previously undiagnosed COPD in the context of a long smoking history, although clinical and laboratory evidence is less suggestive of overt acute COPD exacerbation at this juncture.  Plan: Check blood cultures x2.  Continue azithromycin and Rocephin.  Follow closely for result of nasopharyngeal COVID-19 PCR, with plan to discontinue IV antibiotics and start remdesivir/Decadron if this result subsequently found to be positive.  Repeat CBC with differential in the morning.  Monitor on telemetry.  Monitor continuous pulse oximetry.  Check procalcitonin.  Check urine strep pneumoniae antigen as well as urine Legionella antigen.  As needed albuterol inhaler.  Add on serum magnesium level.  Check serum phosphorus.   Flutter valve.     #) Generalized weakness: The patient reports 3 to 4 days of generalized weakness in the absence of any associated acute focal weakness.  Suspect that this is on the basis of physiologic stress stemming from presenting acute hypoxic respiratory failure in setting of suspected community-acquired pneumonia, as above.  Plan: Work-up and management of presenting acute hypoxic respiratory failure, as further described above.  We will also check reflex TSH.  I placed a physical therapy eval/treat order to occur in the morning.      #) Essential Hypertension: home antihypertensive regimen includes Norvasc, Lopressor, and Lasix.  It appears that he was previously prescribed losartan, but that he no longer takes this medication. systolic blood pressure in the ED today noted to be in the 150s to 160s mmHg.  Plan: Close monitoring of subsequent blood pressure via routine vital signs.  Continue home Norvasc, Lasix, and Lopressor.      #) Hyperlipidemia: On fenofibrate as an outpatient.  Plan: Continue home fenofibrate.     #) Chronic diastolic heart failure: Documented history of such, with most recent echocardiogram in December 2020 showing evidence of grade 1 diastolic dysfunction and LVEF 55 to 6%, with additional details from this echocardiogram as described above.  Outpatient diuretic regimen consists of Lasix 40 mg p.o. daily.  As described above, clinical and radiographic evidence are less suggestive of acutely decompensated heart failure.  Plan: Monitor strict I's and O's and daily weights.  Continue home Lasix.  Repeat BMP in the morning.  Add on serum magnesium level.      #) Elevated creatinine: Presenting labs reflect creatinine of 2.47, which appears to be trending down relative to most recent prior creatinine data point of 2.92 when checked on 01/03/2020.  The chronicity of this elevated creatinine is not completely clear at the present time, his most recent  prior creatinine data points occurred in 2015.  Consequently, unable to make a diagnosis of acute kidney injury per the currently available information.  Will pursue additional chart review and attempt to ascertain interval creatinine values for better determination of chronicity of elevated creatinine.  Plan: Additional chart review for determination of baseline creatinine, as further described above.  Monitor strict I's and O's and daily weights.  Attempt to avoid nephrotoxic agents.  Check urinalysis with microscopy.  Check random urine sodium as well as random urine creatinine.  Repeat BMP in the morning.      #) Chronic alcohol abuse: The patient reports a history of chronic alcohol abuse in which she reports typical daily consumption of 1 pint of rum, with most recent alcohol consumption occurring on Saturday, 02/08/2020.  He reports that he has no interest in discontinuation of his alcohol consumption at this time.  Denies any history of alcohol withdrawal symptoms when he has previously abstained from alcohol consumption.  Given that the patient does not desire to discontinue alcohol consumption given that he is capable of taking p.o., there does not appear to be a need for alcohol withdrawal to ensure, as the patient has reported his plan to resume alcohol consumption as soon as he is discharged.  Consequently, we will plan to order alcoholic beverages 3 times daily with meals to prevent unnecessary alcohol withdrawal.  Considered ordering banana bag, but ultimately decided to refrain from this in the setting of the patient's history of chronic diastolic heart failure.  Rather, we will proceed with oral vitamin supplementation, as further described below.  Plan: Alcohol 3 times daily with meals, as above.  Daily p.o. thiamine, folic acid, and multivitamin supplementation, with first doses to occur this evening.  Counseled the patient on the importance of alcohol discontinuation.  Serum magnesium  level.  Repeat BMP in the morning.  Add on serum ethanol level.    DVT prophylaxis: Heparin 5000 units subcu 3 times daily Code Status: Full code Family Communication: none Disposition Plan: Per Rounding Team Consults called: none  Admission status: Inpatient; stepdown unit    PLEASE NOTE THAT DRAGON DICTATION SOFTWARE WAS USED IN THE CONSTRUCTION OF THIS NOTE.   [facility] Pager [phone] From [place].   Otherwise, please contact night-coverage  [email or web address] Password [facility]  02/10/2020, 4:40 PM

## 2020-02-10 NOTE — ED Notes (Signed)
Blood taken to lab by nursing once drawn

## 2020-02-10 NOTE — ED Notes (Signed)
Initial oxygen sat 86% on arrival

## 2020-02-10 NOTE — ED Triage Notes (Signed)
S/o shortness of breath, states this has going on for some time but is worse today,

## 2020-02-11 ENCOUNTER — Inpatient Hospital Stay (HOSPITAL_COMMUNITY): Payer: Medicare HMO

## 2020-02-11 ENCOUNTER — Other Ambulatory Visit: Payer: Self-pay

## 2020-02-11 LAB — BLOOD GAS, ARTERIAL
Acid-Base Excess: 9.9 mmol/L — ABNORMAL HIGH (ref 0.0–2.0)
Acid-Base Excess: 10.6 mmol/L — ABNORMAL HIGH (ref 0.0–2.0)
Bicarbonate: 32.3 mmol/L — ABNORMAL HIGH (ref 20.0–28.0)
Bicarbonate: 33.1 mmol/L — ABNORMAL HIGH (ref 20.0–28.0)
Drawn by: 41977
FIO2: 36
FIO2: 40
O2 Saturation: 93.8 %
O2 Saturation: 94.9 %
Patient temperature: 37
Patient temperature: 37
pCO2 arterial: 75.3 mmHg (ref 32.0–48.0)
pCO2 arterial: 74.4 mmHg (ref 32.0–48.0)
pH, Arterial: 7.305 — ABNORMAL LOW (ref 7.350–7.450)
pH, Arterial: 7.317 — ABNORMAL LOW (ref 7.350–7.450)
pO2, Arterial: 77 mmHg — ABNORMAL LOW (ref 83.0–108.0)
pO2, Arterial: 82.1 mmHg — ABNORMAL LOW (ref 83.0–108.0)

## 2020-02-11 LAB — CBC WITH DIFFERENTIAL/PLATELET
Abs Immature Granulocytes: 0.01 10*3/uL (ref 0.00–0.07)
Basophils Absolute: 0 10*3/uL (ref 0.0–0.1)
Basophils Relative: 0 %
Eosinophils Absolute: 0 10*3/uL (ref 0.0–0.5)
Eosinophils Relative: 1 %
HCT: 30.2 % — ABNORMAL LOW (ref 39.0–52.0)
Hemoglobin: 9.3 g/dL — ABNORMAL LOW (ref 13.0–17.0)
Immature Granulocytes: 0 %
Lymphocytes Relative: 17 %
Lymphs Abs: 0.6 10*3/uL — ABNORMAL LOW (ref 0.7–4.0)
MCH: 29.2 pg (ref 26.0–34.0)
MCHC: 30.8 g/dL (ref 30.0–36.0)
MCV: 94.7 fL (ref 80.0–100.0)
Monocytes Absolute: 0.4 10*3/uL (ref 0.1–1.0)
Monocytes Relative: 12 %
Neutro Abs: 2.6 10*3/uL (ref 1.7–7.7)
Neutrophils Relative %: 70 %
Platelets: 148 10*3/uL — ABNORMAL LOW (ref 150–400)
RBC: 3.19 MIL/uL — ABNORMAL LOW (ref 4.22–5.81)
RDW: 14.6 % (ref 11.5–15.5)
WBC: 3.7 10*3/uL — ABNORMAL LOW (ref 4.0–10.5)
nRBC: 0 % (ref 0.0–0.2)

## 2020-02-11 LAB — BASIC METABOLIC PANEL
Anion gap: 10 (ref 5–15)
BUN: 24 mg/dL — ABNORMAL HIGH (ref 8–23)
CO2: 32 mmol/L (ref 22–32)
Calcium: 8.7 mg/dL — ABNORMAL LOW (ref 8.9–10.3)
Chloride: 99 mmol/L (ref 98–111)
Creatinine, Ser: 2.25 mg/dL — ABNORMAL HIGH (ref 0.61–1.24)
GFR calc Af Amer: 32 mL/min — ABNORMAL LOW (ref >60)
GFR calc non Af Amer: 27 mL/min — ABNORMAL LOW (ref >60)
Glucose, Bld: 89 mg/dL (ref 70–99)
Potassium: 3.9 mmol/L (ref 3.5–5.1)
Sodium: 141 mmol/L (ref 135–145)

## 2020-02-11 LAB — PHOSPHORUS: Phosphorus: 3.8 mg/dL (ref 2.5–4.6)

## 2020-02-11 LAB — PROTIME-INR
INR: 1.1 (ref 0.8–1.2)
Prothrombin Time: 13.7 seconds (ref 11.4–15.2)

## 2020-02-11 LAB — MAGNESIUM: Magnesium: 1.7 mg/dL (ref 1.7–2.4)

## 2020-02-11 LAB — STREP PNEUMONIAE URINARY ANTIGEN: Strep Pneumo Urinary Antigen: NEGATIVE

## 2020-02-11 LAB — TSH: TSH: 0.758 u[IU]/mL (ref 0.350–4.500)

## 2020-02-11 LAB — MRSA PCR SCREENING: MRSA by PCR: NEGATIVE

## 2020-02-11 LAB — SARS CORONAVIRUS 2 (TAT 6-24 HRS): SARS Coronavirus 2: NEGATIVE

## 2020-02-11 MED ORDER — SODIUM CHLORIDE 0.9 % IV SOLN: INTRAVENOUS | Status: DC | PRN

## 2020-02-11 MED ORDER — LORAZEPAM 2 MG/ML IJ SOLN
1.0000 mg | INTRAMUSCULAR | Status: DC | PRN
  Administered 2020-02-11 – 2020-02-13 (×4): 1 mg via INTRAVENOUS
  Filled 2020-02-11 (×4): qty 1

## 2020-02-11 NOTE — Plan of Care (Signed)
  Problem: Acute Rehab PT Goals(only PT should resolve) Goal: Pt Will Go Supine/Side To Sit Outcome: Progressing Flowsheets (Taken 02/11/2020 1236) Pt will go Supine/Side to Sit: Independently Goal: Patient Will Transfer Sit To/From Stand Outcome: Progressing Flowsheets (Taken 02/11/2020 1236) Patient will transfer sit to/from stand: with modified independence Goal: Pt Will Transfer Bed To Chair/Chair To Bed Outcome: Progressing Flowsheets (Taken 02/11/2020 1236) Pt will Transfer Bed to Chair/Chair to Bed: with modified independence Goal: Pt Will Ambulate Outcome: Progressing Flowsheets (Taken 02/11/2020 1236) Pt will Ambulate:  > 125 feet  with modified independence  with rolling walker   12:37 PM, 02/11/20 Lonell Grandchild, MPT Physical Therapist with [facility] [phone] office [phone] mobile phone

## 2020-02-11 NOTE — Progress Notes (Addendum)
PROGRESS NOTE    Andrew Adams  OEV:[id number] DOB: 1946/09/03 DOA: 02/10/2020 PCP: Lavella Lemons, PA   Brief Narrative:  Per HPI: Andrew LORENTZ is a 74 y.o. male with medical history significant for chronic diastolic heart failure, chronic alcohol abuse, hypertension, hyperlipidemia, who is admitted to [facility] on 02/10/2020 with acute hypoxic respiratory failure due to suspected community-acquired pneumonia after presenting from home to [facility] complaining of shortness of breath.   Reports approximately 1 week of progressive shortness of breath associated with new onset nonproductive cough.  He denies any associated orthopnea, PND, or recent worsening of his chronic bilateral lower extremity edema.  Denies any associated hemoptysis, calf tenderness, lower extremity erythema.  Denies any recent trauma or travel.  Denies any associated wheezing.  Denies any recent chest pain, diaphoresis, palpitations, nausea, vomiting.  Also denies any recent diarrhea, melena, or hematochezia.  He acknowledges a history of chronic heart failure, and reports good compliance with his home Lasix, without any recent dose adjustments to this diuretic.  He also conveys generalized weakness over the last 3 to 4 days, in the absence of any acute focal weakness.  Of note, most recent echocardiogram, which was performed in December 2020, showed mild LVH, LVEF 55 to 82%, grade 1 diastolic dysfunction, no evidence of focal wall motion abnormalities.  Echo also showed moderately dilated left atrium and mild mitral regurgitation.  The patient acknowledges that he is a former smoker, having smoked 1 pack/day for 20 to 25 years, before completely quitting smoking in 1996 without any subsequent resumption thereof.  He denies any known underlying chronic pulmonary pathology, including no known underlying emphysema.  Denies any baseline supplemental oxygen requirements.  Denies any associated subjective  fever, chills, rigors, or generalized myalgias. Denies any recent headache, neck stiffness, rhinitis, rhinorrhea, sore throat, or rash. No known COVID-19 exposures. Denies dysuria, gross hematuria, or change in urinary urgency/frequency.    Acknowledges a history of chronic alcohol abuse in which she reports typical daily consumption of 1 pint of rum, with most recent alcohol consumption occurring on Saturday, 02/08/2020.  He reports that he has no interest in discontinuation of his alcohol consumption at this time.  Denies any history of alcohol withdrawal symptoms when he has previously abstained from alcohol consumption.   4/13: Patient was admitted with acute hypoxemic and hypercapnic respiratory failure secondary to community-acquired pneumonia and has been started on Rocephin and azithromycin.  His COVID-19 testing has returned negative and atypical studies are still pending.  He was placed on BiPAP and progressive care unit and has been weaned down to 4 L nasal cannula oxygen at this time.  He does have a history of chronic alcoholism and has been started on alcohol supplementation with daily meals to prevent withdrawal symptoms.  Assessment & Plan:   Principal Problem:   Acute respiratory failure with hypoxia (HCC) Active Problems:   Hypertension   Hyperlipidemia   GERD (gastroesophageal reflux disease)   CAP (community acquired pneumonia)   Generalized weakness   SOB (shortness of breath)   Chronic alcohol abuse   Acute hypoxemic and hypercapnic respiratory failure secondary to community-acquired pneumonia -Blood cultures with no growth thus far -Continue Rocephin and azithromycin -Urine strep pneumonia negative -Covid testing negative -Wean oxygen as tolerated -Continue breathing treatments as needed -Repeat ABG in a.m. to establish baseline  Generalized weakness -TSH 0.758 -PT evaluation with home health recommended  AKI versus CKD -Uncertain if patient has AKI and  therefore I  will order renal ultrasound as well as urine electrolytes for further evaluation  Essential hypertension -Continue on Norvasc and Lopressor and hold Lasix until further evaluation for AKI complete  Dyslipidemia -Continue fenofibrate  Chronic diastolic heart failure -Noted to have echo on 12/20 with grade 1 diastolic dysfunction and LVEF 55-60% -Hold Lasix for now and monitor daily weights -Currently appears compensated  Chronic alcohol abuse -Supplementing alcohol 3 times daily with meals -No withdrawal symptoms currently noted -Continue thiamine, folic acid, and multivitamin supplementation  DVT prophylaxis: Heparin changed to SCDs due to thrombocytopenia Code Status: Full Family Communication: We will plan to call spouse Disposition Plan: Continue current antibiotics and wean oxygen supplementation as tolerated.  Monitor for withdrawal symptoms.  If his respiratory status improves he can be moved to general medical floor by a.m.   Consultants:   None  Procedures:   None  Antimicrobials:  Anti-infectives (From admission, onward)   Start     Dose/Rate Route Frequency Ordered Stop   02/11/20 1700  azithromycin (ZITHROMAX) 500 mg in sodium chloride 0.9 % 250 mL IVPB     500 mg 250 mL/hr over 60 Minutes Intravenous Every 24 hours 02/10/20 2034     02/11/20 1700  cefTRIAXone (ROCEPHIN) 1 g in sodium chloride 0.9 % 100 mL IVPB     1 g 200 mL/hr over 30 Minutes Intravenous Every 24 hours 02/10/20 2034     02/10/20 1615  cefTRIAXone (ROCEPHIN) 1 g in sodium chloride 0.9 % 100 mL IVPB     1 g 200 mL/hr over 30 Minutes Intravenous  Once 02/10/20 1609 02/10/20 1705   02/10/20 1615  azithromycin (ZITHROMAX) 500 mg in sodium chloride 0.9 % 250 mL IVPB     500 mg 250 mL/hr over 60 Minutes Intravenous  Once 02/10/20 1609 02/10/20 1914       Subjective: Patient seen and evaluated today with no new acute complaints or concerns. No acute concerns or events noted  overnight.  He was noted to be on BiPAP this morning.  Objective: Vitals:   02/11/20 0814 02/11/20 0846 02/11/20 0900 02/11/20 1100  BP:   (!) 140/119 (!) 159/79  Pulse:   95 87  Resp:   (!) 25 (!) 22  Temp:  98.6 F (37 C)    TempSrc:  Axillary    SpO2: 97%  96% 93%  Weight:      Height:        Intake/Output Summary (Last 24 hours) at 02/11/2020 1419 Last data filed at 02/11/2020 0900 Gross per 24 hour  Intake 350.1 ml  Output 1620 ml  Net -1269.9 ml   Filed Weights   02/11/20 0600  Weight: 96.5 kg    Examination:  General exam: Appears calm and comfortable  Respiratory system: Clear to auscultation. Respiratory effort normal.  Currently on BiPAP 16/8, 40% FiO2. Cardiovascular system: S1 & S2 heard, RRR. No JVD, murmurs, rubs, gallops or clicks. No pedal edema. Gastrointestinal system: Abdomen is nondistended, soft and nontender. No organomegaly or masses felt. Normal bowel sounds heard. Central nervous system: Alert and oriented. No focal neurological deficits. Extremities: Symmetric 5 x 5 power. Skin: No rashes, lesions or ulcers Psychiatry: Flat affect    Data Reviewed: I have personally reviewed following labs and imaging studies  CBC: Recent Labs  Lab 02/10/20 1242 02/11/20 0557  WBC 4.3 3.7*  NEUTROABS 2.6 2.6  HGB 10.6* 9.3*  HCT 33.3* 30.2*  MCV 91.0 94.7  PLT 213 604*   Basic Metabolic Panel:  Recent Labs  Lab 02/10/20 1242 02/10/20 1443 02/11/20 0557  NA 135  --  141  K 3.9  --  3.9  CL 96*  --  99  CO2 28  --  32  GLUCOSE 107*  --  89  BUN 24*  --  24*  CREATININE 2.47*  --  2.25*  CALCIUM 8.6*  --  8.7*  MG  --  2.1 1.7  PHOS  --   --  3.8   GFR: Estimated Creatinine Clearance: 32 mL/min (A) (by C-G formula based on SCr of 2.25 mg/dL (H)). Liver Function Tests: Recent Labs  Lab 02/10/20 1242  AST 24  ALT 12  ALKPHOS 30*  BILITOT 0.5  PROT 7.3  ALBUMIN 4.0   No results for input(s): LIPASE, AMYLASE in the last 168 hours.  No results for input(s): AMMONIA in the last 168 hours. Coagulation Profile: Recent Labs  Lab 02/11/20 0557  INR 1.1   Cardiac Enzymes: No results for input(s): CKTOTAL, CKMB, CKMBINDEX, TROPONINI in the last 168 hours. BNP (last 3 results) No results for input(s): PROBNP in the last 8760 hours. HbA1C: No results for input(s): HGBA1C in the last 72 hours. CBG: No results for input(s): GLUCAP in the last 168 hours. Lipid Profile: No results for input(s): CHOL, HDL, LDLCALC, TRIG, CHOLHDL, LDLDIRECT in the last 72 hours. Thyroid Function Tests: Recent Labs    02/11/20 0546  TSH 0.758   Anemia Panel: No results for input(s): VITAMINB12, FOLATE, FERRITIN, TIBC, IRON, RETICCTPCT in the last 72 hours. Sepsis Labs: Recent Labs  Lab 02/10/20 1443  PROCALCITON <0.10    Recent Results (from the past 240 hour(s))  SARS CORONAVIRUS 2 (TAT 6-24 HRS) Nasopharyngeal Nasopharyngeal Swab     Status: None   Collection Time: 02/10/20  2:47 PM   Specimen: Nasopharyngeal Swab  Result Value Ref Range Status   SARS Coronavirus 2 NEGATIVE NEGATIVE Final    Comment: (NOTE) SARS-CoV-2 target nucleic acids are NOT DETECTED. The SARS-CoV-2 RNA is generally detectable in upper and lower respiratory specimens during the acute phase of infection. Negative results do not preclude SARS-CoV-2 infection, do not rule out co-infections with other pathogens, and should not be used as the sole basis for treatment or other patient management decisions. Negative results must be combined with clinical observations, patient history, and epidemiological information. The expected result is Negative. Fact Sheet for Patients: SugarRoll.be Fact Sheet for Healthcare Providers: https://www.woods-mathews.com/ This test is not yet approved or cleared by the [place] FDA and  has been authorized for detection and/or diagnosis of SARS-CoV-2 by FDA under an Emergency Use  Authorization (EUA). This EUA will remain  in effect (meaning this test can be used) for the duration of the COVID-19 declaration under Section 56 4(b)(1) of the Act, 21 U.S.C. section 360bbb-3(b)(1), unless the authorization is terminated or revoked sooner. Performed at [facility], [place] [address] [place], [place] [address]   Culture, blood (Routine X 2) w Reflex to ID Panel     Status: None (Preliminary result)   Collection Time: 02/10/20  9:07 PM   Specimen: BLOOD  Result Value Ref Range Status   Specimen Description BLOOD BLOOD LEFT ARM  Final   Special Requests   Final    BOTTLES DRAWN AEROBIC ONLY Blood Culture adequate volume Performed at [facility], [address] [place], [place] [address]    Culture PENDING  Incomplete   Report Status PENDING  Incomplete  Respiratory Panel by RT  PCR (Flu A&B, Covid) - Nasopharyngeal Swab     Status: None   Collection Time: 02/10/20  9:22 PM   Specimen: Nasopharyngeal Swab  Result Value Ref Range Status   SARS Coronavirus 2 by RT PCR NEGATIVE NEGATIVE Final    Comment: (NOTE) SARS-CoV-2 target nucleic acids are NOT DETECTED. The SARS-CoV-2 RNA is generally detectable in upper respiratoy specimens during the acute phase of infection. The lowest concentration of SARS-CoV-2 viral copies this assay can detect is 131 copies/mL. A negative result does not preclude SARS-Cov-2 infection and should not be used as the sole basis for treatment or other patient management decisions. A negative result may occur with  improper specimen collection/handling, submission of specimen other than nasopharyngeal swab, presence of viral mutation(s) within the areas targeted by this assay, and inadequate number of viral copies (<131 copies/mL). A negative result must be combined with clinical observations, patient history, and epidemiological information. The expected result is Negative. Fact Sheet for Patients:   PinkCheek.be Fact Sheet for Healthcare Providers:  GravelBags.it This test is not yet ap proved or cleared by the [place] FDA and  has been authorized for detection and/or diagnosis of SARS-CoV-2 by FDA under an Emergency Use Authorization (EUA). This EUA will remain  in effect (meaning this test can be used) for the duration of the COVID-19 declaration under Section 564(b)(1) of the Act, 21 U.S.C. section 360bbb-3(b)(1), unless the authorization is terminated or revoked sooner.    Influenza A by PCR NEGATIVE NEGATIVE Final   Influenza B by PCR NEGATIVE NEGATIVE Final    Comment: (NOTE) The Xpert Xpress SARS-CoV-2/FLU/RSV assay is intended as an aid in  the diagnosis of influenza from Nasopharyngeal swab specimens and  should not be used as a sole basis for treatment. Nasal washings and  aspirates are unacceptable for Xpert Xpress SARS-CoV-2/FLU/RSV  testing. Fact Sheet for Patients: PinkCheek.be Fact Sheet for Healthcare Providers: GravelBags.it This test is not yet approved or cleared by the [place] FDA and  has been authorized for detection and/or diagnosis of SARS-CoV-2 by  FDA under an Emergency Use Authorization (EUA). This EUA will remain  in effect (meaning this test can be used) for the duration of the  Covid-19 declaration under Section 564(b)(1) of the Act, 21  U.S.C. section 360bbb-3(b)(1), unless the authorization is  terminated or revoked. Performed at [facility], [address], [place], [place] [address]   Culture, blood (Routine X 2) w Reflex to ID Panel     Status: None (Preliminary result)   Collection Time: 02/10/20  9:32 PM   Specimen: BLOOD RIGHT FOREARM  Result Value Ref Range Status   Specimen Description BLOOD RIGHT FOREARM  Final   Special Requests   Final    BOTTLES DRAWN AEROBIC AND ANAEROBIC Blood Culture adequate volume  Performed at [facility], [address], [place], [place] [address]    Culture PENDING  Incomplete   Report Status PENDING  Incomplete  MRSA PCR Screening     Status: None   Collection Time: 02/10/20 10:35 PM   Specimen: Nasal Mucosa; Nasopharyngeal  Result Value Ref Range Status   MRSA by PCR NEGATIVE NEGATIVE Final    Comment:        The GeneXpert MRSA Assay (FDA approved for NASAL specimens only), is one component of a comprehensive MRSA colonization surveillance program. It is not intended to diagnose MRSA infection nor to guide or monitor treatment for MRSA infections. Performed at [facility], [address],  [place], [place] [address]          Radiology Studies: [facility] Chest [facility] 1 View  Result Date: 02/10/2020 CLINICAL DATA:  Shortness of breath. EXAM: PORTABLE CHEST 1 VIEW COMPARISON:  02/03/2020 FINDINGS: The cardiac silhouette is borderline enlarged. Lung volumes are lower than on the prior study. Mild peribronchial thickening is mildly more prominent than on the prior study, and there are scattered ill-defined opacities bilaterally. A small left pleural effusion is questioned. No pneumothorax is identified. No acute osseous abnormality is seen. IMPRESSION: Mild peribronchial thickening with scattered ill-defined opacities bilaterally which may reflect infection (including atypical and viral etiologies). Electronically Signed   By: Logan Bores M.D.   On: 02/10/2020 13:20        Scheduled Meds: . allopurinol  100 mg Oral Daily  . amLODipine  5 mg Oral Daily  . atorvastatin  40 mg Oral Daily  . Chlorhexidine Gluconate Cloth  6 each Topical Daily  . fenofibrate  160 mg Oral Daily  . folic acid  1 mg Oral Daily  . furosemide  40 mg Oral Daily  . metoprolol tartrate  50 mg Oral BID  . multivitamin with minerals  1 tablet Oral Daily  . pantoprazole  40 mg Oral Daily  . sodium chloride flush  3 mL Intravenous Q12H  . spiritus frumenti  1 each Oral TID with meals   . thiamine  100 mg Oral Daily   Continuous Infusions: . azithromycin    . cefTRIAXone (ROCEPHIN)  IV       LOS: 1 day    Time spent: 35 minutes    Jhanvi Drakeford Darleen Crocker, DO [facility]  If 7PM-7AM, please contact night-coverage [email or web address] 02/11/2020, 2:19 PM

## 2020-02-11 NOTE — Evaluation (Signed)
Physical Therapy Evaluation Patient Details Name: Andrew Adams MRN: [id number] DOB: 1946-03-10 Today's Date: 02/11/2020   History of Present Illness  Andrew Adams is a 74 y.o. male with medical history significant for chronic diastolic heart failure, chronic alcohol abuse, hypertension, hyperlipidemia, who is admitted to [facility] on 02/10/2020 with acute hypoxic respiratory failure due to suspected community-acquired pneumonia after presenting from home to [facility] complaining of shortness of breath.    Clinical Impression  Patient limited for functional mobility as stated below secondary to BLE weakness, fatigue and fair/poor standing balance.  Patient presents on 4 LPM O2 and desaturated to 80's when put on room air when lying in bed, after sitting up and put back on 4 LPM, SpO2 at 98-100%.  Patient on 2 LPM during ambulation with SpO2 at 97% no c/o of SOB.  Patient tolerated sitting up on commode to have bowel movement after therapy - RN notified.   Patient will benefit from continued physical therapy in hospital and recommended venue below to increase strength, balance, endurance for safe ADLs and gait.     Follow Up Recommendations Home health PT    Equipment Recommendations  None recommended by PT    Recommendations for Other Services       Precautions / Restrictions Precautions Precautions: Fall Restrictions Weight Bearing Restrictions: No      Mobility  Bed Mobility Overal bed mobility: Modified Independent             General bed mobility comments: increased time  Transfers Overall transfer level: Needs assistance Equipment used: Rolling walker (2 wheeled);None Transfers: Sit to/from [employer] to Stand: Supervision;Min guard Stand pivot transfers: Supervision;Min guard       General transfer comment: has to lean on nearby objects for support when not using AD, safer using RW  Ambulation/Gait Ambulation/Gait  assistance: Supervision;Min guard Gait Distance (Feet): 55 Feet Assistive device: Rolling walker (2 wheeled) Gait Pattern/deviations: Decreased step length - right;Decreased step length - left;Decreased stride length Gait velocity: decreased   General Gait Details: slightly labored cadence without loss of balance, limited secondary to fatigue, on 2 LPM O2 with SpO2 at 97%  Stairs            Wheelchair Mobility    Modified Rankin (Stroke Patients Only)       Balance Overall balance assessment: Needs assistance Sitting-balance support: Feet supported;No upper extremity supported Sitting balance-Leahy Scale: Good Sitting balance - Comments: seated at EOB   Standing balance support: During functional activity;No upper extremity supported Standing balance-Leahy Scale: Poor Standing balance comment: fair/poor without AD, fair/good using RW                             Pertinent Vitals/Pain Pain Assessment: No/denies pain    Home Living Family/patient expects to be discharged to:: Private residence Living Arrangements: Spouse/significant other Available Help at Discharge: Family;Available 24 hours/day Type of Home: House Home Access: Level entry     Home Layout: One level Home Equipment: Walker - 2 wheels;Cane - single point      Prior Function Level of Independence: Independent         Comments: Hydrographic surveyor, drives     Journalist, newspaper        Extremity/Trunk Assessment   Upper Extremity Assessment Upper Extremity Assessment: Generalized weakness    Lower Extremity Assessment Lower Extremity Assessment: Generalized weakness    Cervical / Trunk Assessment  Cervical / Trunk Assessment: Normal  Communication   Communication: No difficulties  Cognition Arousal/Alertness: Awake/alert Behavior During Therapy: WFL for tasks assessed/performed Overall Cognitive Status: Within Functional Limits for tasks assessed                                         General Comments      Exercises     Assessment/Plan    PT Assessment Patient needs continued PT services  PT Problem List Decreased strength;Decreased activity tolerance;Decreased balance;Decreased mobility       PT Treatment Interventions Balance training;Gait training;Stair training;Functional mobility training;Therapeutic activities;Therapeutic exercise;Patient/family education    PT Goals (Current goals can be found in the Care Plan section)  Acute Rehab PT Goals Patient Stated Goal: return home with family to assist PT Goal Formulation: With patient Time For Goal Achievement: 02/18/20 Potential to Achieve Goals: Good    Frequency Min 3X/week   Barriers to discharge        Co-evaluation               AM-PAC PT "6 Clicks" Mobility  Outcome Measure Help needed turning from your back to your side while in a flat bed without using bedrails?: None Help needed moving from lying on your back to sitting on the side of a flat bed without using bedrails?: None Help needed moving to and from a bed to a chair (including a wheelchair)?: A Little Help needed standing up from a chair using your arms (e.g., wheelchair or bedside chair)?: A Little Help needed to walk in hospital room?: A Little Help needed climbing 3-5 steps with a railing? : A Little 6 Click Score: 20    End of Session   Activity Tolerance: Patient tolerated treatment well;Patient limited by fatigue Patient left: in chair;with call bell/phone within reach Nurse Communication: Mobility status PT Visit Diagnosis: Unsteadiness on feet (R26.81);Other abnormalities of gait and mobility (R26.89);Muscle weakness (generalized) (M62.81)    Time: 4388-8757 PT Time Calculation (min) (ACUTE ONLY): 31 min   Charges:   PT Evaluation $PT Eval Moderate Complexity: 1 Mod PT Treatments $Therapeutic Activity: 23-37 mins        12:34 PM, 02/11/20 Lonell Grandchild, MPT Physical  Therapist with [facility] [phone] office [phone] mobile phone

## 2020-02-11 NOTE — Progress Notes (Signed)
0355 notified Hospitalist of critical ABG values, orders received and completed by RN/RT.

## 2020-02-12 ENCOUNTER — Inpatient Hospital Stay (HOSPITAL_COMMUNITY): Payer: Medicare HMO

## 2020-02-12 DIAGNOSIS — N179 Acute kidney failure, unspecified: Secondary | ICD-10-CM

## 2020-02-12 DIAGNOSIS — R531 Weakness: Secondary | ICD-10-CM

## 2020-02-12 DIAGNOSIS — R0602 Shortness of breath: Secondary | ICD-10-CM

## 2020-02-12 DIAGNOSIS — I10 Essential (primary) hypertension: Secondary | ICD-10-CM

## 2020-02-12 DIAGNOSIS — I509 Heart failure, unspecified: Secondary | ICD-10-CM

## 2020-02-12 DIAGNOSIS — J189 Pneumonia, unspecified organism: Principal | ICD-10-CM

## 2020-02-12 LAB — BLOOD GAS, ARTERIAL
Acid-Base Excess: 10.6 mmol/L — ABNORMAL HIGH (ref 0.0–2.0)
Acid-Base Excess: 11.6 mmol/L — ABNORMAL HIGH (ref 0.0–2.0)
Bicarbonate: 32.9 mmol/L — ABNORMAL HIGH (ref 20.0–28.0)
Bicarbonate: 33.6 mmol/L — ABNORMAL HIGH (ref 20.0–28.0)
Delivery systems: POSITIVE
Drawn by: 41977
Expiratory PAP: 6
FIO2: 40
FIO2: 40
Inspiratory PAP: 12
O2 Saturation: 93.5 %
O2 Saturation: 72.2 %
Patient temperature: 37
Patient temperature: 38
RATE: 12 resp/min
pCO2 arterial: 77.4 mmHg (ref 32.0–48.0)
pCO2 arterial: 75.7 mmHg (ref 32.0–48.0)
pH, Arterial: 7.302 — ABNORMAL LOW (ref 7.350–7.450)
pH, Arterial: 7.32 — ABNORMAL LOW (ref 7.350–7.450)
pO2, Arterial: 75.1 mmHg — ABNORMAL LOW (ref 83.0–108.0)
pO2, Arterial: 42.6 mmHg — ABNORMAL LOW (ref 83.0–108.0)

## 2020-02-12 LAB — BASIC METABOLIC PANEL
Anion gap: 7 (ref 5–15)
BUN: 28 mg/dL — ABNORMAL HIGH (ref 8–23)
CO2: 34 mmol/L — ABNORMAL HIGH (ref 22–32)
Calcium: 8.8 mg/dL — ABNORMAL LOW (ref 8.9–10.3)
Chloride: 98 mmol/L (ref 98–111)
Creatinine, Ser: 2.45 mg/dL — ABNORMAL HIGH (ref 0.61–1.24)
GFR calc Af Amer: 29 mL/min — ABNORMAL LOW (ref >60)
GFR calc non Af Amer: 25 mL/min — ABNORMAL LOW (ref >60)
Glucose, Bld: 113 mg/dL — ABNORMAL HIGH (ref 70–99)
Potassium: 4.1 mmol/L (ref 3.5–5.1)
Sodium: 139 mmol/L (ref 135–145)

## 2020-02-12 LAB — CBC
HCT: 29.9 % — ABNORMAL LOW (ref 39.0–52.0)
Hemoglobin: 9.3 g/dL — ABNORMAL LOW (ref 13.0–17.0)
MCH: 29.1 pg (ref 26.0–34.0)
MCHC: 31.1 g/dL (ref 30.0–36.0)
MCV: 93.4 fL (ref 80.0–100.0)
Platelets: 210 10*3/uL (ref 150–400)
RBC: 3.2 MIL/uL — ABNORMAL LOW (ref 4.22–5.81)
RDW: 14.5 % (ref 11.5–15.5)
WBC: 4.8 10*3/uL (ref 4.0–10.5)
nRBC: 0 % (ref 0.0–0.2)

## 2020-02-12 LAB — LEGIONELLA PNEUMOPHILA SEROGP 1 UR AG: L. pneumophila Serogp 1 Ur Ag: NEGATIVE

## 2020-02-12 LAB — MAGNESIUM: Magnesium: 1.7 mg/dL (ref 1.7–2.4)

## 2020-02-12 MED ORDER — METHYLPREDNISOLONE SODIUM SUCC 125 MG IJ SOLR
60.0000 mg | Freq: Four times a day (QID) | INTRAMUSCULAR | Status: DC
  Administered 2020-02-12 – 2020-02-13 (×4): 60 mg via INTRAVENOUS
  Filled 2020-02-12 (×4): qty 2

## 2020-02-12 MED ORDER — IPRATROPIUM-ALBUTEROL 0.5-2.5 (3) MG/3ML IN SOLN
3.0000 mL | RESPIRATORY_TRACT | Status: DC
  Administered 2020-02-12 (×2): 3 mL via RESPIRATORY_TRACT
  Filled 2020-02-12 (×2): qty 3

## 2020-02-12 MED ORDER — LORAZEPAM 1 MG PO TABS: 1.0000 mg | ORAL_TABLET | ORAL | Status: DC | PRN

## 2020-02-12 MED ORDER — IPRATROPIUM-ALBUTEROL 0.5-2.5 (3) MG/3ML IN SOLN
3.0000 mL | Freq: Four times a day (QID) | RESPIRATORY_TRACT | Status: DC
  Administered 2020-02-12 – 2020-02-14 (×7): 3 mL via RESPIRATORY_TRACT
  Filled 2020-02-12 (×7): qty 3

## 2020-02-12 MED ORDER — SPIRITUS FRUMENTI
1.0000 | Freq: Three times a day (TID) | ORAL | Status: DC | PRN
  Administered 2020-02-13 – 2020-02-14 (×5): 1 via ORAL
  Filled 2020-02-12 (×8): qty 1

## 2020-02-12 MED ORDER — LORAZEPAM 2 MG/ML IJ SOLN: 1.0000 mg | INTRAMUSCULAR | Status: DC | PRN

## 2020-02-12 NOTE — Progress Notes (Signed)
PROGRESS NOTE    Andrew Adams  LOV:[id number] DOB: [birth year] DOA: 02/10/2020 PCP: Lavella Lemons, PA   Brief Narrative:  Per HPI: Andrew Adams is a 74 y.o. male with medical history significant for chronic diastolic heart failure, chronic alcohol abuse, hypertension, hyperlipidemia, who is admitted to [facility] on 02/10/2020 with acute hypoxic respiratory failure due to suspected community-acquired pneumonia after presenting from home to [facility] Emergency Department complaining of shortness of breath.   Reports approximately 1 week of progressive shortness of breath associated with new onset nonproductive cough.  He denies any associated orthopnea, PND, or recent worsening of his chronic bilateral lower extremity edema.  Denies any associated hemoptysis, calf tenderness, lower extremity erythema.  Denies any recent trauma or travel.  Denies any associated wheezing.  Denies any recent chest pain, diaphoresis, palpitations, nausea, vomiting.  Also denies any recent diarrhea, melena, or hematochezia.  He acknowledges a history of chronic heart failure, and reports good compliance with his home Lasix, without any recent dose adjustments to this diuretic.  He also conveys generalized weakness over the last 3 to 4 days, in the absence of any acute focal weakness.  Of note, most recent echocardiogram, which was performed in December 2020, showed mild LVH, LVEF 55 to 88%, grade 1 diastolic dysfunction, no evidence of focal wall motion abnormalities.  Echo also showed moderately dilated left atrium and mild mitral regurgitation.  The patient acknowledges that he is a former smoker, having smoked 1 pack/day for 20 to 25 years, before completely quitting smoking in 1996 without any subsequent resumption thereof.  He denies any known underlying chronic pulmonary pathology, including no known underlying emphysema.  Denies any baseline supplemental oxygen requirements.  Denies any associated subjective  fever, chills, rigors, or generalized myalgias. Denies any recent headache, neck stiffness, rhinitis, rhinorrhea, sore throat, or rash. No known COVID-19 exposures. Denies dysuria, gross hematuria, or change in urinary urgency/frequency.    Acknowledges a history of chronic alcohol abuse in which she reports typical daily consumption of 1 pint of rum, with most recent alcohol consumption occurring on Saturday, 02/08/2020.  He reports that he has no interest in discontinuation of his alcohol consumption at this time.  Denies any history of alcohol withdrawal symptoms when he has previously abstained from alcohol consumption.   4/13: Patient was admitted with acute hypoxemic and hypercapnic respiratory failure secondary to community-acquired pneumonia and has been started on Rocephin and azithromycin.  His COVID-19 testing has returned negative and atypical studies are still pending.  He was placed on BiPAP and progressive care unit and has been weaned down to 4 L nasal cannula oxygen at this time.  He does have a history of chronic alcoholism and has been started on alcohol supplementation with daily meals to prevent withdrawal symptoms.  Assessment & Plan:   Principal Problem:   Acute respiratory failure with hypoxia (HCC) Active Problems:   Hypertension   Hyperlipidemia   GERD (gastroesophageal reflux disease)   CAP (community acquired pneumonia)   Generalized weakness   SOB (shortness of breath)   Chronic alcohol abuse  Acute hypoxemic and hypercapnic respiratory failure secondary to COPD exacerbation -Blood cultures with no growth thus far -Continue Rocephin and azithromycin for presumed atypical pneumonia - IV steroids started, scheduled Duoneb treatments started.  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   Essential hypertension -Continue on Norvasc and Lopressor and hold Lasix temporarily  Dyslipidemia -Continue fenofibrate  Chronic diastolic heart failure -Noted to have echo on 12/20 with grade 1 diastolic dysfunction and LVEF 55-60% -Hold Lasix for now and monitor daily weights -Currently appears compensated  Chronic alcohol abuse -reduce alcohol from scheduled to PRN -No withdrawal symptoms currently noted -Continue thiamine, folic acid, and multivitamin supplementation  DVT prophylaxis: Heparin changed to SCDs due to thrombocytopenia Code Status: Full Family Communication: discussed plan of care with patient at bedside, verbalized understanding Disposition Plan: Continue stepdown ICU care as he continues to require bipap therapy, added IV steroids, scheduled duonebs, etc.    Consultants:   None  Procedures:   None  Antimicrobials:  Anti-infectives (From admission, onward)   Start     Dose/Rate Route Frequency Ordered Stop   02/11/20 1700  azithromycin (ZITHROMAX) 500 mg in sodium chloride 0.9 % 250 mL IVPB     500 mg 250 mL/hr over 60 Minutes Intravenous Every 24 hours 02/10/20 2034     02/11/20 1700  cefTRIAXone (ROCEPHIN) 1 g in sodium chloride 0.9 % 100 mL IVPB     1 g 200 mL/hr over 30 Minutes Intravenous Every 24 hours 02/10/20 2034     02/10/20 1615  cefTRIAXone (ROCEPHIN) 1 g in sodium chloride 0.9 % 100 mL IVPB     1 g 200 mL/hr over 30 Minutes Intravenous  Once 02/10/20 1609 02/10/20 1705   02/10/20 1615  azithromycin (ZITHROMAX) 500 mg in sodium chloride 0.9 % 250 mL IVPB     500 mg 250 mL/hr over 60 Minutes Intravenous  Once 02/10/20 1609 02/10/20 1914      Subjective: Patient still requires bipap therapy.  He reports being on no oxygen at home.  He is retaining high CO2 per ABG.  Objective: Vitals:   02/12/20 1029 02/12/20 1100 02/12/20 1120 02/12/20 1233  BP:      Pulse: 94 86      Resp: 20 (!) 25    Temp:   98.8 F (37.1 C)   TempSrc:   Oral   SpO2: 94% 91%  93%  Weight:      Height:        Intake/Output Summary (Last 24 hours) at 02/12/2020 1250 Last data filed at 02/12/2020 1024 Gross per 24 hour  Intake 883 ml  Output 550 ml  Net 333 ml   Filed Weights   02/11/20 0600 02/12/20 0355  Weight: 96.5 kg 96.7 kg    Examination:  General exam: Appears calm and comfortable.  He was seen on bipap therapy.  Respiratory system: shallow tight BS bilateral. Respiratory effort normal.   Cardiovascular system: S1 & S2 heard, RRR. No JVD, murmurs, rubs, gallops or clicks. No pedal edema. Gastrointestinal system: Abdomen is nondistended, soft and nontender. No organomegaly or masses felt. Normal bowel sounds heard. Central nervous system: Alert and oriented. No focal neurological deficits. Extremities: Symmetric 5 x 5 power. Skin: No rashes, lesions or ulcers Psychiatry: Normal affect.  Data Reviewed: I have personally reviewed following labs and imaging studies  CBC: Recent Labs  Lab 02/10/20 1242 02/11/20 0557 02/12/20 0524  WBC 4.3 3.7* 4.8  NEUTROABS 2.6 2.6  --   HGB 10.6* 9.3* 9.3*  HCT 33.3* 30.2* 29.9*  MCV 91.0 94.7 93.4  PLT 213 148* 160   Basic Metabolic Panel: Recent Labs  Lab 02/10/20 1242 02/10/20 1443 02/11/20 0557 02/12/20 0524  NA 135  --  141 139  K 3.9  --  3.9 4.1  CL 96*  --  99 98  CO2 28  --  32 34*  GLUCOSE 107*  --  89 113*  BUN 24*  --  24* 28*  CREATININE 2.47*  --  2.25* 2.45*  CALCIUM 8.6*  --  8.7* 8.8*  MG  --  2.1 1.7 1.7  PHOS  --   --  3.8  --    GFR: Estimated Creatinine Clearance: 29.4 mL/min (A) (by C-G formula based on SCr of 2.45 mg/dL (H)). Liver Function Tests: Recent Labs  Lab 02/10/20 1242  AST 24  ALT 12  ALKPHOS 30*  BILITOT 0.5  PROT 7.3  ALBUMIN 4.0   No results for input(s): LIPASE, AMYLASE in the last 168 hours. No results for input(s): AMMONIA in the last 168 hours. Coagulation  Profile: Recent Labs  Lab 02/11/20 0557  INR 1.1   Cardiac Enzymes: No results for input(s): CKTOTAL, CKMB, CKMBINDEX, TROPONINI in the last 168 hours. BNP (last 3 results) No results for input(s): PROBNP in the last 8760 hours. HbA1C: No results for input(s): HGBA1C in the last 72 hours. CBG: No results for input(s): GLUCAP in the last 168 hours. Lipid Profile: No results for input(s): CHOL, HDL, LDLCALC, TRIG, CHOLHDL, LDLDIRECT in the last 72 hours. Thyroid Function Tests: Recent Labs    02/11/20 0546  TSH 0.758   Anemia Panel: No results for input(s): VITAMINB12, FOLATE, FERRITIN, TIBC, IRON, RETICCTPCT in the last 72 hours. Sepsis Labs: Recent Labs  Lab 02/10/20 1443  PROCALCITON <0.10    Recent Results (from the past 240 hour(s))  SARS CORONAVIRUS 2 (TAT 6-24 HRS) Nasopharyngeal Nasopharyngeal Swab     Status: None   Collection Time: 02/10/20  2:47 PM   Specimen: Nasopharyngeal Swab  Result Value Ref Range Status   SARS Coronavirus 2 NEGATIVE NEGATIVE Final    Comment: (NOTE) SARS-CoV-2 target nucleic acids are NOT DETECTED. The SARS-CoV-2 RNA is generally detectable in upper and lower respiratory specimens during the acute phase of infection. Negative results do not preclude SARS-CoV-2 infection, do not rule out co-infections with other pathogens, and should not be used as the sole basis for treatment or other patient management decisions. Negative results must be combined with clinical observations, patient history, and epidemiological information. The expected result is Negative. Fact Sheet for Patients: SugarRoll.be Fact Sheet for Healthcare Providers: https://www.woods-mathews.com/ This test is not yet approved or cleared by the [place] FDA and  has been authorized for detection and/or diagnosis of SARS-CoV-2 by FDA under an Emergency Use Authorization (EUA). This EUA will remain  in effect (meaning this  test can be used) for the duration of the COVID-19 declaration under Section 56 4(b)(1) of the Act, 21 U.S.C. section 360bbb-3(b)(1), unless the authorization is terminated or revoked sooner. Performed at [facility], [place] [address] [place], [place] [address]   Culture, blood (Routine X 2) w Reflex to ID Panel     Status: None (Preliminary result)   Collection Time: 02/10/20  9:07 PM   Specimen: BLOOD  Result Value Ref Range Status   Specimen Description BLOOD BLOOD LEFT ARM  Final   Special Requests   Final    BOTTLES DRAWN AEROBIC ONLY Blood Culture adequate volume   Culture   Final    NO GROWTH 2 DAYS Performed at [facility], [address], [place], [place] [address]    Report Status  PENDING  Incomplete  Respiratory Panel by RT PCR (Flu A&B, Covid) - Nasopharyngeal Swab     Status: None   Collection Time: 02/10/20  9:22 PM   Specimen: Nasopharyngeal Swab  Result Value Ref Range Status   SARS Coronavirus 2 by RT PCR NEGATIVE NEGATIVE Final    Comment: (NOTE) SARS-CoV-2 target nucleic acids are NOT DETECTED. The SARS-CoV-2 RNA is generally detectable in upper respiratoy specimens during the acute phase of infection. The lowest concentration of SARS-CoV-2 viral copies this assay can detect is 131 copies/mL. A negative result does not preclude SARS-Cov-2 infection and should not be used as the sole basis for treatment or other patient management decisions. A negative result may occur with  improper specimen collection/handling, submission of specimen other than nasopharyngeal swab, presence of viral mutation(s) within the areas targeted by this assay, and inadequate number of viral copies (<131 copies/mL). A negative result must be combined with clinical observations, patient history, and epidemiological information. The expected result is Negative. Fact Sheet for Patients:  PinkCheek.be Fact Sheet for Healthcare Providers:    GravelBags.it This test is not yet ap proved or cleared by the [place] FDA and  has been authorized for detection and/or diagnosis of SARS-CoV-2 by FDA under an Emergency Use Authorization (EUA). This EUA will remain  in effect (meaning this test can be used) for the duration of the COVID-19 declaration under Section 564(b)(1) of the Act, 21 U.S.C. section 360bbb-3(b)(1), unless the authorization is terminated or revoked sooner.    Influenza A by PCR NEGATIVE NEGATIVE Final   Influenza B by PCR NEGATIVE NEGATIVE Final    Comment: (NOTE) The Xpert Xpress SARS-CoV-2/FLU/RSV assay is intended as an aid in  the diagnosis of influenza from Nasopharyngeal swab specimens and  should not be used as a sole basis for treatment. Nasal washings and  aspirates are unacceptable for Xpert Xpress SARS-CoV-2/FLU/RSV  testing. Fact Sheet for Patients: PinkCheek.be Fact Sheet for Healthcare Providers: GravelBags.it This test is not yet approved or cleared by the [place] FDA and  has been authorized for detection and/or diagnosis of SARS-CoV-2 by  FDA under an Emergency Use Authorization (EUA). This EUA will remain  in effect (meaning this test can be used) for the duration of the  Covid-19 declaration under Section 564(b)(1) of the Act, 21  U.S.C. section 360bbb-3(b)(1), unless the authorization is  terminated or revoked. Performed at [facility], [address] [place], [place] [address]   Culture, blood (Routine X 2) w Reflex to ID Panel     Status: None (Preliminary result)   Collection Time: 02/10/20  9:32 PM   Specimen: BLOOD RIGHT FOREARM  Result Value Ref Range Status   Specimen Description BLOOD RIGHT FOREARM  Final   Special Requests   Final    BOTTLES DRAWN AEROBIC AND ANAEROBIC Blood Culture adequate volume   Culture   Final    NO GROWTH 2 DAYS Performed at [facility], [address] [place], [place] [address]    Report Status PENDING  Incomplete  MRSA PCR Screening     Status: None   Collection Time: 02/10/20 10:35 PM   Specimen: Nasal Mucosa; Nasopharyngeal  Result Value Ref Range Status   MRSA by PCR NEGATIVE NEGATIVE Final    Comment:        The GeneXpert MRSA Assay (FDA approved for NASAL specimens only), is one component of a comprehensive MRSA colonization surveillance program. It is not intended to diagnose MRSA infection nor  to guide or monitor treatment for MRSA infections. Performed at [facility], [address] [place], [place] [address]     Radiology Studies: [facility] Chest [facility] 1 View  Result Date: 02/10/2020 CLINICAL DATA:  Shortness of breath. EXAM: PORTABLE CHEST 1 VIEW COMPARISON:  02/03/2020 FINDINGS: The cardiac silhouette is borderline enlarged. Lung volumes are lower than on the prior study. Mild peribronchial thickening is mildly more prominent than on the prior study, and there are scattered ill-defined opacities bilaterally. A small left pleural effusion is questioned. No pneumothorax is identified. No acute osseous abnormality is seen. IMPRESSION: Mild peribronchial thickening with scattered ill-defined opacities bilaterally which may reflect infection (including atypical and viral etiologies). Electronically Signed   By: Logan Bores M.D.   On: 02/10/2020 13:20   Scheduled Meds: . allopurinol  100 mg Oral Daily  . amLODipine  5 mg Oral Daily  . atorvastatin  40 mg Oral Daily  . Chlorhexidine Gluconate Cloth  6 each Topical Daily  . fenofibrate  160 mg Oral Daily  . folic acid  1 mg Oral Daily  . ipratropium-albuterol  3 mL Nebulization Q4H  . methylPREDNISolone (SOLU-MEDROL) injection  60 mg Intravenous Q6H  . metoprolol tartrate  50 mg Oral BID  . multivitamin with minerals  1 tablet Oral Daily  . pantoprazole  40 mg Oral Daily  . sodium chloride flush  3 mL Intravenous Q12H  . spiritus frumenti  1 each Oral TID with meals  .  thiamine  100 mg Oral Daily   Continuous Infusions: . sodium chloride    . azithromycin 500 mg (02/11/20 1824)  . cefTRIAXone (ROCEPHIN)  IV 1 g (02/11/20 1725)     LOS: 2 days   Critical Care Procedure Note Authorized and Performed by: Murvin Natal MD  Total Critical Care time:  33 mins Due to a high probability of clinically significant, life threatening deterioration, the patient required my highest level of preparedness to intervene emergently and I personally spent this critical care time directly and personally managing the patient.  This critical care time included obtaining a history; examining the patient, pulse oximetry; ordering and review of studies; arranging urgent treatment with development of a management plan; evaluation of patient's response of treatment; frequent reassessment; and discussions with other providers.  This critical care time was performed to assess and manage the high probability of imminent and life threatening deterioration that could result in multi-organ failure.  It was exclusive of separately billable procedures and treating other patients and teaching time.    Irwin Brakeman, MD How to contact the [facility] Attending or Consulting provider [place] or covering provider during after hours [place], for this patient?  1. Check the care team in [facility] and look for a) attending/consulting TRH provider listed and b) the [facility] team listed 2. Log into [email or web address] and use [place]'s universal password to access. If you do not have the password, please contact the hospital operator. 3. Locate the [facility] provider you are looking for under [facility] and page to a number that you can be directly reached. 4. If you still have difficulty reaching the provider, please page the [facility] (Director on Call) for the Hospitalists listed on amion for assistance.  02/12/2020, 12:50 PM

## 2020-02-12 NOTE — Progress Notes (Signed)
PCO2 of 77.4  Pt is on BiPAP Value reported via text page to MD.

## 2020-02-12 NOTE — Progress Notes (Signed)
Physical Therapy Treatment Patient Details Name: Andrew Adams MRN: [id number] DOB: 08-18-1946 Today's Date: 02/12/2020    History of Present Illness Andrew Adams is a 74 y.o. male with medical history significant for chronic diastolic heart failure, chronic alcohol abuse, hypertension, hyperlipidemia, who is admitted to [facility] on 02/10/2020 with acute hypoxic respiratory failure due to suspected community-acquired pneumonia after presenting from home to [facility] Department complaining of shortness of breath.    PT Comments    Patient demonstrates improvement for standing balance and able to ambulate in room/hallway without use of RW with slow slightly labored cadence, once fatigued had one near fall due to scissoring of legs, had to slow cadence and required more assistance to make it back to room.  Patient advised to use RW if ambulates with nursing staff or family members while in hospital.  Patient on 2 LPM O2 during ambulation with SpO2 at 92%, patient put back on 3 LPM after sitting in chair due to c/o SOB - RN notified.  Patient will benefit from continued physical therapy in hospital and recommended venue below to increase strength, balance, endurance for safe ADLs and gait.    Follow Up Recommendations  Home health PT     Equipment Recommendations  None recommended by PT    Recommendations for Other Services       Precautions / Restrictions Precautions Precautions: Fall Restrictions Weight Bearing Restrictions: No    Mobility  Bed Mobility               General bed mobility comments: Presents up in chair (assisted by nurisng staff)  Transfers Overall transfer level: Needs assistance Equipment used: None Transfers: Sit to/from [facility] Sit to Stand: Modified independent (Device/Increase time);Supervision Stand pivot transfers: Modified independent (Device/Increase time);Supervision       General transfer comment: occasional  leaning on nearby objects for support  Ambulation/Gait Ambulation/Gait assistance: Min guard Gait Distance (Feet): 100 Feet Assistive device: None Gait Pattern/deviations: Decreased step length - right;Decreased step length - left;Decreased stride length;Scissoring Gait velocity: decreased   General Gait Details: slightly labored cadence with on near fall due to fatiguing while ambulating without AD, mild scissoring of legs once fatigued, on 2 LPM O2 with SpO2 at 92%   Stairs             Wheelchair Mobility    Modified Rankin (Stroke Patients Only)       Balance Overall balance assessment: Needs assistance Sitting-balance support: Feet supported;No upper extremity supported Sitting balance-Leahy Scale: Good Sitting balance - Comments: seated at EOB   Standing balance support: During functional activity;No upper extremity supported Standing balance-Leahy Scale: Fair Standing balance comment: fair/good static, fair dynamic                            Cognition Arousal/Alertness: Awake/alert Behavior During Therapy: WFL for tasks assessed/performed Overall Cognitive Status: Within Functional Limits for tasks assessed                                        Exercises General Exercises - Lower Extremity Long Arc Quad: Seated;AROM;Strengthening;Both;15 reps Hip Flexion/Marching: Seated;AROM;Strengthening;Both;15 reps Toe Raises: Seated;AROM;Strengthening;Both;20 reps Heel Raises: Seated;AROM;Strengthening;Both;20 reps    General Comments        Pertinent Vitals/Pain Pain Assessment: No/denies pain    Home Living  Prior Function            PT Goals (current goals can now be found in the care plan section) Acute Rehab PT Goals Patient Stated Goal: return home with family to assist PT Goal Formulation: With patient Time For Goal Achievement: 02/18/20 Potential to Achieve Goals: Good Progress towards PT  goals: Progressing toward goals    Frequency    Min 3X/week      PT Plan Current plan remains appropriate    Co-evaluation              AM-PAC PT "6 Clicks" Mobility   Outcome Measure  Help needed turning from your back to your side while in a flat bed without using bedrails?: None Help needed moving from lying on your back to sitting on the side of a flat bed without using bedrails?: None Help needed moving to and from a bed to a chair (including a wheelchair)?: A Little Help needed standing up from a chair using your arms (e.g., wheelchair or bedside chair)?: None Help needed to walk in hospital room?: A Little Help needed climbing 3-5 steps with a railing? : A Little 6 Click Score: 21    End of Session Equipment Utilized During Treatment: Oxygen Activity Tolerance: Patient tolerated treatment well;Patient limited by fatigue Patient left: in chair;with call bell/phone within reach Nurse Communication: Mobility status PT Visit Diagnosis: Unsteadiness on feet (R26.81);Other abnormalities of gait and mobility (R26.89);Muscle weakness (generalized) (M62.81)     Time: 7116-5790 PT Time Calculation (min) (ACUTE ONLY): 22 min  Charges:  $Gait Training: 8-22 mins $Therapeutic Exercise: 8-22 mins                     12:00 PM, 02/12/20 Lonell Grandchild, MPT Physical Therapist with [facility] 336 [phone] office [phone] mobile phone

## 2020-02-13 ENCOUNTER — Encounter (HOSPITAL_COMMUNITY): Payer: Self-pay | Admitting: Internal Medicine

## 2020-02-13 ENCOUNTER — Inpatient Hospital Stay (HOSPITAL_COMMUNITY): Payer: Medicare HMO

## 2020-02-13 DIAGNOSIS — E785 Hyperlipidemia, unspecified: Secondary | ICD-10-CM

## 2020-02-13 DIAGNOSIS — J9622 Acute and chronic respiratory failure with hypercapnia: Secondary | ICD-10-CM

## 2020-02-13 DIAGNOSIS — J9621 Acute and chronic respiratory failure with hypoxia: Secondary | ICD-10-CM | POA: Diagnosis not present

## 2020-02-13 DIAGNOSIS — F1010 Alcohol abuse, uncomplicated: Secondary | ICD-10-CM

## 2020-02-13 DIAGNOSIS — I5031 Acute diastolic (congestive) heart failure: Secondary | ICD-10-CM

## 2020-02-13 LAB — CBC
HCT: 28.6 % — ABNORMAL LOW (ref 39.0–52.0)
Hemoglobin: 8.9 g/dL — ABNORMAL LOW (ref 13.0–17.0)
MCH: 28.7 pg (ref 26.0–34.0)
MCHC: 31.1 g/dL (ref 30.0–36.0)
MCV: 92.3 fL (ref 80.0–100.0)
Platelets: 203 10*3/uL (ref 150–400)
RBC: 3.1 MIL/uL — ABNORMAL LOW (ref 4.22–5.81)
RDW: 14.3 % (ref 11.5–15.5)
WBC: 3.9 10*3/uL — ABNORMAL LOW (ref 4.0–10.5)
nRBC: 0 % (ref 0.0–0.2)

## 2020-02-13 LAB — BLOOD GAS, ARTERIAL
Acid-Base Excess: 9.4 mmol/L — ABNORMAL HIGH (ref 0.0–2.0)
Bicarbonate: 32.1 mmol/L — ABNORMAL HIGH (ref 20.0–28.0)
FIO2: 32
O2 Saturation: 94 %
Patient temperature: 37
pCO2 arterial: 65.8 mmHg (ref 32.0–48.0)
pH, Arterial: 7.347 — ABNORMAL LOW (ref 7.350–7.450)
pO2, Arterial: 74.4 mmHg — ABNORMAL LOW (ref 83.0–108.0)

## 2020-02-13 LAB — RENAL FUNCTION PANEL
Albumin: 3.6 g/dL (ref 3.5–5.0)
Anion gap: 8 (ref 5–15)
BUN: 31 mg/dL — ABNORMAL HIGH (ref 8–23)
CO2: 33 mmol/L — ABNORMAL HIGH (ref 22–32)
Calcium: 8.9 mg/dL (ref 8.9–10.3)
Chloride: 95 mmol/L — ABNORMAL LOW (ref 98–111)
Creatinine, Ser: 2.34 mg/dL — ABNORMAL HIGH (ref 0.61–1.24)
GFR calc Af Amer: 30 mL/min — ABNORMAL LOW (ref >60)
GFR calc non Af Amer: 26 mL/min — ABNORMAL LOW (ref >60)
Glucose, Bld: 194 mg/dL — ABNORMAL HIGH (ref 70–99)
Phosphorus: 3.1 mg/dL (ref 2.5–4.6)
Potassium: 4.4 mmol/L (ref 3.5–5.1)
Sodium: 136 mmol/L (ref 135–145)

## 2020-02-13 LAB — MAGNESIUM: Magnesium: 1.9 mg/dL (ref 1.7–2.4)

## 2020-02-13 MED ORDER — METHYLPREDNISOLONE SODIUM SUCC 125 MG IJ SOLR: 60.0000 mg | Freq: Every day | INTRAMUSCULAR | Status: DC

## 2020-02-13 MED ORDER — METHYLPREDNISOLONE SODIUM SUCC 40 MG IJ SOLR
40.0000 mg | Freq: Two times a day (BID) | INTRAMUSCULAR | Status: DC
  Administered 2020-02-13 – 2020-02-14 (×3): 40 mg via INTRAVENOUS
  Filled 2020-02-13 (×3): qty 1

## 2020-02-13 MED ORDER — HALOPERIDOL LACTATE 5 MG/ML IJ SOLN
2.0000 mg | Freq: Four times a day (QID) | INTRAMUSCULAR | Status: DC | PRN
  Administered 2020-02-13 (×2): 2 mg via INTRAVENOUS
  Filled 2020-02-13 (×3): qty 1

## 2020-02-13 MED ORDER — LORAZEPAM 2 MG/ML IJ SOLN
0.0000 mg | Freq: Four times a day (QID) | INTRAMUSCULAR | Status: AC
  Filled 2020-02-13: qty 1

## 2020-02-13 MED ORDER — AMLODIPINE BESYLATE 5 MG PO TABS
10.0000 mg | ORAL_TABLET | Freq: Every day | ORAL | Status: DC
  Administered 2020-02-13 – 2020-02-15 (×3): 10 mg via ORAL
  Filled 2020-02-13 (×3): qty 2

## 2020-02-13 MED ORDER — FUROSEMIDE 40 MG PO TABS
40.0000 mg | ORAL_TABLET | Freq: Every day | ORAL | Status: DC
  Administered 2020-02-13 – 2020-02-15 (×3): 40 mg via ORAL
  Filled 2020-02-13 (×3): qty 1

## 2020-02-13 MED ORDER — METHYLPREDNISOLONE SODIUM SUCC 40 MG IJ SOLR: 40.0000 mg | Freq: Every day | INTRAMUSCULAR | Status: DC

## 2020-02-13 MED ORDER — HYDRALAZINE HCL 25 MG PO TABS
25.0000 mg | ORAL_TABLET | Freq: Three times a day (TID) | ORAL | Status: DC
  Administered 2020-02-13 – 2020-02-14 (×4): 25 mg via ORAL
  Filled 2020-02-13 (×4): qty 1

## 2020-02-13 MED ORDER — LORAZEPAM 2 MG/ML IJ SOLN: 0.0000 mg | Freq: Two times a day (BID) | INTRAMUSCULAR | Status: DC

## 2020-02-13 MED ORDER — LORAZEPAM 2 MG/ML IJ SOLN: 1.0000 mg | INTRAMUSCULAR | Status: DC | PRN

## 2020-02-13 MED ORDER — LORAZEPAM 1 MG PO TABS: 1.0000 mg | ORAL_TABLET | ORAL | Status: DC | PRN

## 2020-02-13 NOTE — Progress Notes (Signed)
Pt pulled off bipap mask twice. He would not keep it on. He seems to be disoriented, possibly DTs. After reorienting patient, staff left the room, only to come back in after a few moments to find patient completely naked standing in room with all leads and monitoring devices off. Pt would not speak to staff. He would not move until finally saying he had to urinate. Pt used urinal put out 200cc clear yellow urine. Pt redressed, put on nasal cannula and back to bed after several minutes of reorientation. He is still showing signs of DTs. PRN ativan given 1mg  IV and also one beer given per MD orders. Pt is resting quietly in bed but still seems quite disoriented.

## 2020-02-13 NOTE — TOC Progression Note (Signed)
Transition of Care [facility]) - Progression Note    Patient Details  Name: CONLEY DELISLE MRN: [id number] Date of Birth: 01/06/1946  Transition of Care [facility]) CM/SW [place], LCSW Phone Number: 02/13/2020, 5:55 PM  Clinical Narrative: TOC received consult for substance use. CSW met with patient to discuss whether he would be interested in substance use resources. Patient reported he may be interested. CSW provided patient with outpatient resources.  Expected Discharge Plan: [place] Barriers to Discharge: Continued Medical Work up  Expected Discharge Plan and Services Expected Discharge Plan: [place] arrangements for the past 2 months: Single Family Home                 Readmission Risk Interventions No flowsheet data found.

## 2020-02-13 NOTE — TOC Initial Note (Signed)
Transition of Care [facility]) - Initial/Assessment Note    Patient Details  Name: Andrew Adams MRN: [id number] Date of Birth: [birth year]  Transition of Care [facility]) CM/SW Contact:    Boneta Lucks, RN Phone Number: 02/13/2020, 2:16 PM  Clinical Narrative: Patient admitted with acute respiratory failure with hypoxia. Patient disoriented. TOC spoke to his wife - Caren Griffins. PT is recommending HHPT.  Caren Griffins is not sure if he will want to do home health.  She will visit him tomorrow and discuss. TOC to follow.              Expected Discharge Plan: [place] Barriers to Discharge: Continued Medical Work up   Patient Goals and CMS Choice Patient states their goals for this hospitalization and ongoing recovery are:: to go home. CMS Medicare.gov Compare Post Acute Care list provided to:: Patient Represenative (must comment) Choice offered to / list presented to : Spouse  Expected Discharge Plan and Services Expected Discharge Plan: [place]       Living arrangements for the past 2 months: Single Family Home                     Prior Living Arrangements/Services Living arrangements for the past 2 months: Single Family Home Lives with:: Spouse       Activities of Daily Living Home Assistive Devices/Equipment: None ADL Screening (condition at time of admission) Patient's cognitive ability adequate to safely complete daily activities?: Yes Is the patient deaf or have difficulty hearing?: No Does the patient have difficulty seeing, even when wearing glasses/contacts?: No Does the patient have difficulty concentrating, remembering, or making decisions?: No Patient able to express need for assistance with ADLs?: Yes Does the patient have difficulty dressing or bathing?: No Independently performs ADLs?: Yes (appropriate for developmental age) Does the patient have difficulty walking or climbing stairs?: No Weakness of Legs: None Weakness of  Arms/Hands: None  Permission Sought/Granted   Emotional Assessment    Alcohol / Substance Use: Alcohol Use Psych Involvement: No (comment)  Admission diagnosis:  SOB (shortness of breath) [R06.02] Acute respiratory failure with hypoxia ([place]) [J96.01] Community acquired pneumonia, unspecified laterality [J18.9] Acute congestive heart failure, unspecified heart failure type ([place]) [I50.9] Patient Active Problem List   Diagnosis Date Noted  . Acute congestive heart failure ([place])   . AKI (acute kidney injury) ([place])   . Acute respiratory failure with hypoxia ([place]) 02/10/2020  . CAP (community acquired pneumonia) 02/10/2020  . Generalized weakness 02/10/2020  . SOB (shortness of breath) 02/10/2020  . Chronic alcohol abuse 02/10/2020  . Varicose veins of bilateral lower extremities with other complications 59/56/3875  . Lumbar disc herniation 01/27/2014  . CKD (chronic kidney disease) stage 2, GFR 60-89 ml/min 01/09/2014  . Sciatica 01/09/2014  . Acute back pain 01/09/2014  . Hypertension 04/12/2013  . Hyperlipidemia 04/12/2013  . GERD (gastroesophageal reflux disease) 04/12/2013  . Palpitation 02/06/2013   PCP:  Lavella Lemons, PA Pharmacy:   [facility] [place], [place] [address] Phone: [phone] Fax: [phone]  [place], [address] [place] [address] Phone: [phone] Fax: [place] [address], [place] [address] Phone: [phone] Fax: [phone]

## 2020-02-13 NOTE — Progress Notes (Signed)
PROGRESS NOTE    Andrew Adams  LKT:[id number] DOB: 1946/09/12 DOA: 02/10/2020 PCP: Lavella Lemons, PA   Brief Narrative:  Per HPI: Andrew Adams is a 74 y.o. male with medical history significant for chronic diastolic heart failure, chronic alcohol abuse, hypertension, hyperlipidemia, who is admitted to [facility] on 02/10/2020 with acute hypoxic respiratory failure due to suspected community-acquired pneumonia after presenting from home to [facility] Emergency Department complaining of shortness of breath.   Reports approximately 1 week of progressive shortness of breath associated with new onset nonproductive cough.  He denies any associated orthopnea, PND, or recent worsening of his chronic bilateral lower extremity edema.  Denies any associated hemoptysis, calf tenderness, lower extremity erythema.  Denies any recent trauma or travel.  Denies any associated wheezing.  Denies any recent chest pain, diaphoresis, palpitations, nausea, vomiting.  Also denies any recent diarrhea, melena, or hematochezia.  He acknowledges a history of chronic heart failure, and reports good compliance with his home Lasix, without any recent dose adjustments to this diuretic.  He also conveys generalized weakness over the last 3 to 4 days, in the absence of any acute focal weakness.  Of note, most recent echocardiogram, which was performed in December 2020, showed mild LVH, LVEF 55 to 34%, grade 1 diastolic dysfunction, no evidence of focal wall motion abnormalities.  Echo also showed moderately dilated left atrium and mild mitral regurgitation.  The patient acknowledges that he is a former smoker, having smoked 1 pack/day for 20 to 25 years, before completely quitting smoking in 1996 without any subsequent resumption thereof.  He denies any known underlying chronic pulmonary pathology, including no known underlying emphysema.  Denies any baseline supplemental oxygen requirements.  Denies any associated subjective  fever, chills, rigors, or generalized myalgias. Denies any recent headache, neck stiffness, rhinitis, rhinorrhea, sore throat, or rash. No known COVID-19 exposures. Denies dysuria, gross hematuria, or change in urinary urgency/frequency.    Acknowledges a history of chronic alcohol abuse in which she reports typical daily consumption of 1 pint of rum, with most recent alcohol consumption occurring on Saturday, 02/08/2020.  He reports that he has no interest in discontinuation of his alcohol consumption at this time.  Denies any history of alcohol withdrawal symptoms when he has previously abstained from alcohol consumption.   4/13: Patient was admitted with acute hypoxemic and hypercapnic respiratory failure secondary to community-acquired pneumonia and has been started on Rocephin and azithromycin.  His COVID-19 testing has returned negative and atypical studies are still pending.  He was placed on BiPAP and progressive care unit and has been weaned down to 4 L nasal cannula oxygen at this time.  He does have a history of chronic alcoholism and has been started on alcohol supplementation with daily meals to prevent withdrawal symptoms.  Assessment & Plan:   Principal Problem:   Acute respiratory failure with hypoxia (HCC) Active Problems:   Hypertension   Hyperlipidemia   GERD (gastroesophageal reflux disease)   CAP (community acquired pneumonia)   Generalized weakness   SOB (shortness of breath)   Chronic alcohol abuse   Acute congestive heart failure (HCC)   AKI (acute kidney injury) ([place])  Acute hypoxemic and hypercapnic respiratory failure secondary to COPD exacerbation -Blood cultures with no growth thus far -Continue Rocephin and azithromycin for presumed atypical pneumonia - IV steroids, scheduled Duoneb treatments started.  -Urine strep pneumonia negative -Covid testing negative -Wean oxygen and Bipap as tolerated -Continue breathing treatments as needed -Following ABGs: no  significant improvement so far.   -Pulmonary consultation requested.   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 -Renal US normal   Essential hypertension -Continue on Norvasc and Lopressor and Lasix   Dyslipidemia -Continue fenofibrate  Chronic diastolic heart failure -Noted to have echo on 12/20 with grade 1 diastolic dysfunction and LVEF 55-60% -Resumed Lasix -Currently appears compensated, follow  Chronic alcohol abuse -CIWA protocol, if no improvement, consider precedex infusion -reduced alcohol from scheduled to PRN -Continue thiamine, folic acid, and multivitamin supplementation  DVT prophylaxis: Heparin changed to SCDs due to thrombocytopenia Code Status: Full Family Communication: telephone call to spouse Disposition Plan: pulmonary consult requested, continue stepdown ICU care as he continues to require bipap therapy, continue IV steroids, scheduled duonebs, etc.    Consultants:   None  Procedures:   None  Antimicrobials:  Anti-infectives (From admission, onward)   Start     Dose/Rate Route Frequency Ordered Stop   02/11/20 1700  azithromycin (ZITHROMAX) 500 mg in sodium chloride 0.9 % 250 mL IVPB     500 mg 250 mL/hr over 60 Minutes Intravenous Every 24 hours 02/10/20 2034     02/11/20 1700  cefTRIAXone (ROCEPHIN) 1 g in sodium chloride 0.9 % 100 mL IVPB     1 g 200 mL/hr over 30 Minutes Intravenous Every 24 hours 02/10/20 2034     02/10/20 1615  cefTRIAXone (ROCEPHIN) 1 g in sodium chloride 0.9 % 100 mL IVPB     1 g 200 mL/hr over 30 Minutes Intravenous  Once 02/10/20 1609 02/10/20 1705   02/10/20 1615  azithromycin (ZITHROMAX) 500 mg in sodium chloride 0.9 % 250 mL IVPB     500 mg 250 mL/hr over 60 Minutes Intravenous  Once 02/10/20 1609 02/10/20 1914      Subjective: Patient became agitated overnight, still requiring bipap, required some sedation, possible alcohol  withdrawal.  Objective: Vitals:   02/13/20 1107 02/13/20 1345 02/13/20 1350 02/13/20 1447  BP:    (!) 145/88  Pulse: 72 68    Resp: 13     Temp: (!) 97.4 F (36.3 C)     TempSrc: Axillary     SpO2: 95% 94% 94%   Weight:      Height:        Intake/Output Summary (Last 24 hours) at 02/13/2020 1505 Last data filed at 02/13/2020 0834 Gross per 24 hour  Intake 1010.02 ml  Output 700 ml  Net 310.02 ml   Filed Weights   02/11/20 0600 02/12/20 0355 02/13/20 0500  Weight: 96.5 kg 96.7 kg 97.2 kg    Examination:  General exam: Appears calm and comfortable.  He was seen on bipap therapy.  Respiratory system: poor air movement bilateral. On bipap.   Cardiovascular system: S1 & S2 heard, RRR. No JVD, murmurs, rubs, gallops or clicks. No pedal edema. Gastrointestinal system: Abdomen is nondistended, soft and nontender. No organomegaly or masses felt. Normal bowel sounds heard. Central nervous system: Alert and oriented. No focal neurological deficits. Extremities: Symmetric 5 x 5 power. Skin: No rashes, lesions or ulcers Psychiatry: flat affect.   Data Reviewed: I have personally reviewed following labs and imaging studies  CBC: Recent Labs  Lab 02/10/20 1242 02/11/20 0557 02/12/20 0524 02/13/20 0432  WBC 4.3 3.7* 4.8 3.9*  NEUTROABS 2.6 2.6  --   --   HGB 10.6* 9.3* 9.3* 8.9*  HCT 33.3* 30.2* 29.9* 28.6*  MCV 91.0 94.7 93.4 92.3  PLT 213 148* 210 809   Basic Metabolic Panel: Recent Labs  Lab 02/10/20 1242 02/10/20 1443 02/11/20 0557 02/12/20 0524 02/13/20 0432  NA 135  --  141 139 136  K 3.9  --  3.9 4.1 4.4  CL 96*  --  99 98 95*  CO2 28  --  32 34* 33*  GLUCOSE 107*  --  89 113* 194*  BUN 24*  --  24* 28* 31*  CREATININE 2.47*  --  2.25* 2.45* 2.34*  CALCIUM 8.6*  --  8.7* 8.8* 8.9  MG  --  2.1 1.7 1.7 1.9  PHOS  --   --  3.8  --  3.1   GFR: Estimated Creatinine Clearance: 30.9 mL/min (A) (by C-G formula based on SCr of 2.34 mg/dL (H)). Liver Function  Tests: Recent Labs  Lab 02/10/20 1242 02/13/20 0432  AST 24  --   ALT 12  --   ALKPHOS 30*  --   BILITOT 0.5  --   PROT 7.3  --   ALBUMIN 4.0 3.6   No results for input(s): LIPASE, AMYLASE in the last 168 hours. No results for input(s): AMMONIA in the last 168 hours. Coagulation Profile: Recent Labs  Lab 02/11/20 0557  INR 1.1   Cardiac Enzymes: No results for input(s): CKTOTAL, CKMB, CKMBINDEX, TROPONINI in the last 168 hours. BNP (last 3 results) No results for input(s): PROBNP in the last 8760 hours. HbA1C: No results for input(s): HGBA1C in the last 72 hours. CBG: No results for input(s): GLUCAP in the last 168 hours. Lipid Profile: No results for input(s): CHOL, HDL, LDLCALC, TRIG, CHOLHDL, LDLDIRECT in the last 72 hours. Thyroid Function Tests: Recent Labs    02/11/20 0546  TSH 0.758   Anemia Panel: No results for input(s): VITAMINB12, FOLATE, FERRITIN, TIBC, IRON, RETICCTPCT in the last 72 hours. Sepsis Labs: Recent Labs  Lab 02/10/20 1443  PROCALCITON <0.10    Recent Results (from the past 240 hour(s))  SARS CORONAVIRUS 2 (TAT 6-24 HRS) Nasopharyngeal Nasopharyngeal Swab     Status: None   Collection Time: 02/10/20  2:47 PM   Specimen: Nasopharyngeal Swab  Result Value Ref Range Status   SARS Coronavirus 2 NEGATIVE NEGATIVE Final    Comment: (NOTE) SARS-CoV-2 target nucleic acids are NOT DETECTED. The SARS-CoV-2 RNA is generally detectable in upper and lower respiratory specimens during the acute phase of infection. Negative results do not preclude SARS-CoV-2 infection, do not rule out co-infections with other pathogens, and should not be used as the sole basis for treatment or other patient management decisions. Negative results must be combined with clinical observations, patient history, and epidemiological information. The expected result is Negative. Fact Sheet for Patients: SugarRoll.be Fact Sheet for Healthcare  Providers: https://www.woods-mathews.com/ This test is not yet approved or cleared by the [place] FDA and  has been authorized for detection and/or diagnosis of SARS-CoV-2 by FDA under an Emergency Use Authorization (EUA). This EUA will remain  in effect (meaning this test can be used) for the duration of the COVID-19 declaration under Section 56 4(b)(1) of the Act, 21 U.S.C. section 360bbb-3(b)(1), unless the authorization is terminated or revoked sooner. Performed at [facility], [place] [address], [place] [address]   Culture, blood (Routine X 2) w Reflex to ID Panel     Status: None (Preliminary result)   Collection Time: 02/10/20  9:07 PM   Specimen: BLOOD  Result Value Ref Range Status   Specimen Description  BLOOD BLOOD LEFT ARM  Final   Special Requests   Final    BOTTLES DRAWN AEROBIC ONLY Blood Culture adequate volume   Culture   Final    NO GROWTH 2 DAYS Performed at [facility], [address] [place], [place] [address]    Report Status PENDING  Incomplete  Respiratory Panel by RT PCR (Flu A&B, Covid) - Nasopharyngeal Swab     Status: None   Collection Time: 02/10/20  9:22 PM   Specimen: Nasopharyngeal Swab  Result Value Ref Range Status   SARS Coronavirus 2 by RT PCR NEGATIVE NEGATIVE Final    Comment: (NOTE) SARS-CoV-2 target nucleic acids are NOT DETECTED. The SARS-CoV-2 RNA is generally detectable in upper respiratoy specimens during the acute phase of infection. The lowest concentration of SARS-CoV-2 viral copies this assay can detect is 131 copies/mL. A negative result does not preclude SARS-Cov-2 infection and should not be used as the sole basis for treatment or other patient management decisions. A negative result may occur with  improper specimen collection/handling, submission of specimen other than nasopharyngeal swab, presence of viral mutation(s) within the areas targeted by this assay, and inadequate number of viral  copies (<131 copies/mL). A negative result must be combined with clinical observations, patient history, and epidemiological information. The expected result is Negative. Fact Sheet for Patients:  PinkCheek.be Fact Sheet for Healthcare Providers:  GravelBags.it This test is not yet ap proved or cleared by the [place] FDA and  has been authorized for detection and/or diagnosis of SARS-CoV-2 by FDA under an Emergency Use Authorization (EUA). This EUA will remain  in effect (meaning this test can be used) for the duration of the COVID-19 declaration under Section 564(b)(1) of the Act, 21 U.S.C. section 360bbb-3(b)(1), unless the authorization is terminated or revoked sooner.    Influenza A by PCR NEGATIVE NEGATIVE Final   Influenza B by PCR NEGATIVE NEGATIVE Final    Comment: (NOTE) The Xpert Xpress SARS-CoV-2/FLU/RSV assay is intended as an aid in  the diagnosis of influenza from Nasopharyngeal swab specimens and  should not be used as a sole basis for treatment. Nasal washings and  aspirates are unacceptable for Xpert Xpress SARS-CoV-2/FLU/RSV  testing. Fact Sheet for Patients: PinkCheek.be Fact Sheet for Healthcare Providers: GravelBags.it This test is not yet approved or cleared by the [place] FDA and  has been authorized for detection and/or diagnosis of SARS-CoV-2 by  FDA under an Emergency Use Authorization (EUA). This EUA will remain  in effect (meaning this test can be used) for the duration of the  Covid-19 declaration under Section 564(b)(1) of the Act, 21  U.S.C. section 360bbb-3(b)(1), unless the authorization is  terminated or revoked. Performed at [facility], [address] [place], [place] [address]   Culture, blood (Routine X 2) w Reflex to ID Panel     Status: None (Preliminary result)   Collection Time: 02/10/20  9:32 PM   Specimen:  BLOOD RIGHT FOREARM  Result Value Ref Range Status   Specimen Description BLOOD RIGHT FOREARM  Final   Special Requests   Final    BOTTLES DRAWN AEROBIC AND ANAEROBIC Blood Culture adequate volume   Culture   Final    NO GROWTH 2 DAYS Performed at [facility], [address], [place], [place] [address]    Report Status PENDING  Incomplete  MRSA PCR Screening     Status: None   Collection Time: 02/10/20 10:35 PM   Specimen: Nasal Mucosa; Nasopharyngeal  Result  Value Ref Range Status   MRSA by PCR NEGATIVE NEGATIVE Final    Comment:        The GeneXpert MRSA Assay (FDA approved for NASAL specimens only), is one component of a comprehensive MRSA colonization surveillance program. It is not intended to diagnose MRSA infection nor to guide or monitor treatment for MRSA infections. Performed at [facility], [address] [place], [place] [address]     Radiology Studies: US RENAL  Result Date: 02/12/2020 CLINICAL DATA:  Acute kidney injury. EXAM: RENAL / URINARY TRACT ULTRASOUND COMPLETE COMPARISON:  None. FINDINGS: Right Kidney: Renal measurements: 11.6 x 6.3 x 5.7 cm = volume: 220 mL . Echogenicity within normal limits. No mass or hydronephrosis visualized. Left Kidney: Renal measurements: 12.0 x 6.0 x 5.0 cm = volume: 195 mL. Echogenicity within normal limits. No mass or hydronephrosis visualized. Bladder: Appears normal for degree of bladder distention. Ureteral jets are not visualized. Calculated prevoid volume of 54 mL. Other: None. IMPRESSION: Normal renal ultrasound. Electronically Signed   By: Marijo Conception M.D.   On: 02/12/2020 13:55   DG CHEST PORT 1 VIEW  Result Date: 02/13/2020 CLINICAL DATA:  Acute on chronic respiratory failure, shortness of breath, history hypertension, pulmonary embolism, former smoker, GERD EXAM: PORTABLE CHEST 1 VIEW COMPARISON:  Portable exam 1006 hours compared to 02/10/2020 FINDINGS: Enlargement of cardiac silhouette. Prominent LEFT paratracheal  soft tissues at superior mediastinum, not identified on studies of 02/10/2020 and 02/03/2020, suspect due to slight rotation of the shoulders. Pulmonary vascularity normal. Bibasilar opacities, favor atelectasis on RIGHT and atelectasis versus infiltrate on LEFT. Upper lungs otherwise clear. No pleural effusion or pneumothorax. IMPRESSION: RIGHT basilar atelectasis with additional atelectasis versus consolidation in LEFT lower lobe. Enlargement of cardiac silhouette. Prominent LEFT paratracheal soft tissues at superior mediastinum, probably projectional since not definitely visualized on studies of 02/10/2020 or 02/03/2020; follow-up upright PA and lateral chest radiographs recommended for further assessment when patient's clinical condition permits. Electronically Signed   By: Lavonia Dana M.D.   On: 02/13/2020 10:25   Scheduled Meds: . allopurinol  100 mg Oral Daily  . amLODipine  10 mg Oral Daily  . atorvastatin  40 mg Oral Daily  . Chlorhexidine Gluconate Cloth  6 each Topical Daily  . fenofibrate  160 mg Oral Daily  . folic acid  1 mg Oral Daily  . furosemide  40 mg Oral Daily  . hydrALAZINE  25 mg Oral Q8H  . ipratropium-albuterol  3 mL Nebulization Q6H  . LORazepam  0-4 mg Intravenous Q6H   Followed by  . [START ON 02/15/2020] LORazepam  0-4 mg Intravenous Q12H  . [START ON 02/14/2020] methylPREDNISolone (SOLU-MEDROL) injection  40 mg Intravenous Daily  . metoprolol tartrate  50 mg Oral BID  . multivitamin with minerals  1 tablet Oral Daily  . pantoprazole  40 mg Oral Daily  . sodium chloride flush  3 mL Intravenous Q12H  . thiamine  100 mg Oral Daily   Continuous Infusions: . sodium chloride    . azithromycin 250 mL/hr at 02/12/20 1812  . cefTRIAXone (ROCEPHIN)  IV Stopped (02/12/20 1724)     LOS: 3 days   Critical Care Procedure Note Authorized and Performed by: Murvin Natal MD  Total Critical Care time:  35 mins Due to a high probability of clinically significant, life  threatening deterioration, the patient required my highest level of preparedness to intervene emergently and I personally spent this critical care time directly and personally managing the  patient.  This critical care time included obtaining a history; examining the patient, pulse oximetry; ordering and review of studies; arranging urgent treatment with development of a management plan; evaluation of patient's response of treatment; frequent reassessment; and discussions with other providers.  This critical care time was performed to assess and manage the high probability of imminent and life threatening deterioration that could result in multi-organ failure.  It was exclusive of separately billable procedures and treating other patients and teaching time.    Irwin Brakeman, MD How to contact the [facility] Attending or Consulting provider [place] or covering provider during after hours [place], for this patient?  1. Check the care team in [facility] and look for a) attending/consulting TRH provider listed and b) the [facility] team listed 2. Log into [email or web address] and use [facility]'s universal password to access. If you do not have the password, please contact the hospital operator. 3. Locate the [facility] provider you are looking for under [facility] and page to a number that you can be directly reached. 4. If you still have difficulty reaching the provider, please page the [facility] (Director on Call) for the Hospitalists listed on amion for assistance.  02/13/2020, 3:05 PM

## 2020-02-13 NOTE — Progress Notes (Signed)
Pt placed on BIPAP for the night.  Tolerating well at this time.  RT will continue to monitor.

## 2020-02-13 NOTE — Consult Note (Signed)
NAME:  Andrew Adams, MRN:  [id number], DOB:  05/04/1946, LOS: 3 ADMISSION DATE:  02/10/2020, CONSULTATION DATE:  02/13/2020 REFERRING MD:  Dr. Wynetta Emery, [facility], CHIEF COMPLAINT:  Short of breat   Brief History   74 yo male former smoker presented to ER with cough and dyspnea for 1 week.  Found to have hypoxic/hypercapnic respiratory failure from pneumonia.    History of present illness   74 yo male former smoker presented with 1 week of cough, dyspnea.  Found have hypoxia.  CXR showed infiltrate.  Started on ABx for CAP.  Has hx of ETOH and drinks 1 pint of rum per day.  Also treated with steroids and BDs for presumed COPD exacerbation.  Had persistent hypoxia and increased WOB and started on Bipap.  He was agitated overnight.  Received ativan and haldol, and not somnolent.  Past Medical History  Diastolic CHF, ETOH, HTN, HLD, PE Sept 2013, Gout, GERD, CKD 3b  Significant Hospital Events   4/12 Admit 4/14 Add steroids, start Bipap  Consults:    Procedures:    Significant Diagnostic Tests:  Echo 10/30/19 >> EF 55 to 60%, grade 1 DD, mod LA dilation Renal u/s 02/12/20 >> normal  Micro Data:  SARS CoV2 Ag 4/12 >> negative SARS CoV2 TAT 4/12 >> negative Pneumococcal Ag 4/12 >> negative Legionella Ag 4/12 >> negative Influenza PCR 4/12 >> negative   Antimicrobials:  Rocephin 4/12 >> Zithromax 4/12 >>   Interim history/subjective:    Objective   BP (!) 145/82   Pulse 72   Temp (!) 97.4 F (36.3 C) (Axillary)   Resp 13   Ht 5\' 9"  (1.753 m)   Wt 97.2 kg   SpO2 95%   BMI 31.64 kg/m   I/O last 3 completed shifts: In: 1250 [P.O.:720; I.V.:3; IV Piggyback:527] Out: 1100 [Urine:1100]  Examination:  General - somnolent Eyes - pupils reactive ENT - Bipap mask on Cardiac - regular rate/rhythm, no murmur Chest - decreased BS, no wheeze Abdomen - soft, non tender, + bowel sounds Extremities - no cyanosis, clubbing, or edema Skin - no rashes Neuro - sedated, not  following commands Lymphatics - no lymphadenopathy GU - no lesions noted   Resolved Hospital Problem list     Assessment & Plan:   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 - day 4 of ABx - goal SpO2 90 to 95% - continue Bipap - monitor need for intubation - continue solumedrol - scheduled BDs - bronchial hygiene  Agitated delirium with hx of ETOH. - CIWA - thiamine, folic acid - could consider addition of precedex if this persists  Chronic diastolic CHF, HTN, HLD. - continue norvasc, lipitor, fenofibrate, lasix, hydralazine, lopressor  CKD 3b. - baseline creatinine 2.92 from 01/03/20 - f/u BMET  Anemia of chronic disease. - f/u CBC - transfuse for Hb < 7 or significant bleeding  Hx of gout. - continue allopurinol  Best practice:  Diet: heart healthy DVT prophylaxis: SCDs GI prophylaxis: protonix Mobility: PT/OT Code Status: Full code Disposition: ICU/SDU  Labs:  CBC: Recent Labs  Lab 02/10/20 1242 02/11/20 0557 02/12/20 0524 02/13/20 0432  WBC 4.3 3.7* 4.8 3.9*  NEUTROABS 2.6 2.6  --   --   HGB 10.6* 9.3* 9.3* 8.9*  HCT 33.3* 30.2* 29.9* 28.6*  MCV 91.0 94.7 93.4 92.3  PLT 213 148* 210 774    Basic Metabolic Panel: Recent Labs  Lab 02/10/20 1242 02/10/20 1443 02/11/20 0557 02/12/20 0524  02/13/20 0432  NA 135  --  141 139 136  K 3.9  --  3.9 4.1 4.4  CL 96*  --  99 98 95*  CO2 28  --  32 34* 33*  GLUCOSE 107*  --  89 113* 194*  BUN 24*  --  24* 28* 31*  CREATININE 2.47*  --  2.25* 2.45* 2.34*  CALCIUM 8.6*  --  8.7* 8.8* 8.9  MG  --  2.1 1.7 1.7 1.9  PHOS  --   --  3.8  --  3.1   GFR: Estimated Creatinine Clearance: 30.9 mL/min (A) (by C-G formula based on SCr of 2.34 mg/dL (H)). Recent Labs  Lab 02/10/20 1242 02/10/20 1443 02/11/20 0557 02/12/20 0524 02/13/20 0432  PROCALCITON  --  <0.10  --   --   --   WBC 4.3  --  3.7* 4.8 3.9*    Liver Function Tests: Recent Labs  Lab  02/10/20 1242 02/13/20 0432  AST 24  --   ALT 12  --   ALKPHOS 30*  --   BILITOT 0.5  --   PROT 7.3  --   ALBUMIN 4.0 3.6   No results for input(s): LIPASE, AMYLASE in the last 168 hours. No results for input(s): AMMONIA in the last 168 hours.  ABG    Component Value Date/Time   PHART 7.347 (L) 02/13/2020 0734   PCO2ART 65.8 (HH) 02/13/2020 0734   PO2ART 74.4 (L) 02/13/2020 0734   HCO3 32.1 (H) 02/13/2020 0734   O2SAT 94.0 02/13/2020 0734     Coagulation Profile: Recent Labs  Lab 02/11/20 0557  INR 1.1    Cardiac Enzymes: No results for input(s): CKTOTAL, CKMB, CKMBINDEX, TROPONINI in the last 168 hours.  HbA1C: No results found for: HGBA1C  CBG: No results for input(s): GLUCAP in the last 168 hours.  Review of Systems:   Unable to obtain.  Past Medical History  He,  has a past medical history of Borderline diabetic, GERD (gastroesophageal reflux disease), Gout, Gouty arthritis, H/O small bowel obstruction, History of back surgery, Hyperlipidemia, Hypertension, and PE (pulmonary embolism).   Surgical History    Past Surgical History:  Procedure Laterality Date  . ABDOMINAL SURGERY  2103   Smal bowel obstruction   . ABDOMINAL SURGERY  1981   Perforated large intestine  . CATARACT EXTRACTION W/PHACO Right 01/06/2020   Procedure: CATARACT EXTRACTION PHACO AND INTRAOCULAR LENS PLACEMENT (IOC) (CDE: 6.48);  Surgeon: [name], MD;  Location: [facility];  Service: Ophthalmology;  Laterality: Right;  . CATARACT EXTRACTION W/PHACO Left 01/20/2020   Procedure: CATARACT EXTRACTION PHACO AND INTRAOCULAR LENS PLACEMENT (IOC);  Surgeon: [name], MD;  Location: [facility];  Service: Ophthalmology;  Laterality: Left;  CDE: 5.86  . HERNIA REPAIR       Social History   reports that he quit smoking about 25 years ago. He has never used smokeless tobacco. He reports current alcohol use. He reports that he does not use drugs.   Family History   His family history includes  Diabetes in his mother, sister, and sister; Hypertension in his father, mother, and sister; Ulcers in his father.   Allergies No Known Allergies   Home Medications  Prior to Admission medications   Medication Sig Start Date End Date Taking? Authorizing Provider  allopurinol (ZYLOPRIM) 100 MG tablet Take 1 tablet (100 mg total) by mouth daily. 02/24/14  Yes Martin, Mary-Margaret, FNP  amLODipine (NORVASC) 10 MG tablet Take 5 mg by  mouth daily.  12/20/19  Yes [provider]  atorvastatin (LIPITOR) 40 MG tablet Take 1 tablet (40 mg total) by mouth daily. 02/24/14  Yes Martin, Mary-Margaret, FNP  fenofibrate (TRICOR) 145 MG tablet Take 145 mg by mouth daily.   Yes [provider]  furosemide (LASIX) 40 MG tablet Take 40 mg by mouth.   Yes [provider]  metoprolol (LOPRESSOR) 50 MG tablet Take 1 tablet (50 mg total) by mouth 2 (two) times daily. 02/24/14  Yes Martin, Mary-Margaret, FNP  pantoprazole (PROTONIX) 40 MG tablet Take 1 tablet (40 mg total) by mouth daily. 02/24/14  Yes Martin, Mary-Margaret, FNP  losartan (COZAAR) 50 MG tablet Take 50 mg by mouth daily.     [provider]     Critical care time: 33 minutes    Chesley Mires, MD [place] Pager - [phone] 02/13/2020, 12:48 PM

## 2020-02-14 DIAGNOSIS — J9601 Acute respiratory failure with hypoxia: Secondary | ICD-10-CM | POA: Diagnosis not present

## 2020-02-14 LAB — RENAL FUNCTION PANEL
Albumin: 3.4 g/dL — ABNORMAL LOW (ref 3.5–5.0)
Anion gap: 10 (ref 5–15)
BUN: 40 mg/dL — ABNORMAL HIGH (ref 8–23)
CO2: 30 mmol/L (ref 22–32)
Calcium: 8.9 mg/dL (ref 8.9–10.3)
Chloride: 98 mmol/L (ref 98–111)
Creatinine, Ser: 2.41 mg/dL — ABNORMAL HIGH (ref 0.61–1.24)
GFR calc Af Amer: 29 mL/min — ABNORMAL LOW (ref >60)
GFR calc non Af Amer: 25 mL/min — ABNORMAL LOW (ref >60)
Glucose, Bld: 166 mg/dL — ABNORMAL HIGH (ref 70–99)
Phosphorus: 3.3 mg/dL (ref 2.5–4.6)
Potassium: 4.6 mmol/L (ref 3.5–5.1)
Sodium: 138 mmol/L (ref 135–145)

## 2020-02-14 LAB — CBC
HCT: 29 % — ABNORMAL LOW (ref 39.0–52.0)
Hemoglobin: 8.9 g/dL — ABNORMAL LOW (ref 13.0–17.0)
MCH: 28.3 pg (ref 26.0–34.0)
MCHC: 30.7 g/dL (ref 30.0–36.0)
MCV: 92.1 fL (ref 80.0–100.0)
Platelets: 162 10*3/uL (ref 150–400)
RBC: 3.15 MIL/uL — ABNORMAL LOW (ref 4.22–5.81)
RDW: 14.4 % (ref 11.5–15.5)
WBC: 7.3 10*3/uL (ref 4.0–10.5)
nRBC: 0 % (ref 0.0–0.2)

## 2020-02-14 LAB — BLOOD GAS, ARTERIAL
Acid-Base Excess: 9.3 mmol/L — ABNORMAL HIGH (ref 0.0–2.0)
Bicarbonate: 32.1 mmol/L — ABNORMAL HIGH (ref 20.0–28.0)
FIO2: 30
O2 Saturation: 92.3 %
Patient temperature: 36.6
pCO2 arterial: 66.5 mmHg (ref 32.0–48.0)
pH, Arterial: 7.342 — ABNORMAL LOW (ref 7.350–7.450)
pO2, Arterial: 70.3 mmHg — ABNORMAL LOW (ref 83.0–108.0)

## 2020-02-14 LAB — MAGNESIUM: Magnesium: 2 mg/dL (ref 1.7–2.4)

## 2020-02-14 MED ORDER — GUAIFENESIN-DM 100-10 MG/5ML PO SYRP
5.0000 mL | ORAL_SOLUTION | ORAL | Status: DC | PRN
  Administered 2020-02-14: 5 mL via ORAL
  Filled 2020-02-14: qty 5

## 2020-02-14 MED ORDER — AZITHROMYCIN 250 MG PO TABS
500.0000 mg | ORAL_TABLET | Freq: Every day | ORAL | Status: DC
  Administered 2020-02-14: 500 mg via ORAL
  Filled 2020-02-14: qty 2

## 2020-02-14 MED ORDER — HYDRALAZINE HCL 25 MG PO TABS
50.0000 mg | ORAL_TABLET | Freq: Three times a day (TID) | ORAL | Status: DC
  Administered 2020-02-14 – 2020-02-15 (×3): 50 mg via ORAL
  Filled 2020-02-14 (×3): qty 2

## 2020-02-14 MED ORDER — IPRATROPIUM-ALBUTEROL 0.5-2.5 (3) MG/3ML IN SOLN
3.0000 mL | Freq: Three times a day (TID) | RESPIRATORY_TRACT | Status: DC
  Administered 2020-02-14 – 2020-02-15 (×4): 3 mL via RESPIRATORY_TRACT
  Filled 2020-02-14 (×4): qty 3

## 2020-02-14 MED ORDER — ALBUTEROL SULFATE (2.5 MG/3ML) 0.083% IN NEBU: 2.5000 mg | INHALATION_SOLUTION | RESPIRATORY_TRACT | Status: DC | PRN

## 2020-02-14 NOTE — Progress Notes (Signed)
PROGRESS NOTE    Andrew Adams  LZJ:[id number] DOB: Mar 25, 1946 DOA: 02/10/2020 PCP: Lavella Lemons, PA   Brief Narrative:  Per HPI: Andrew Adams is a 74 y.o. male with medical history significant for chronic diastolic heart failure, chronic alcohol abuse, hypertension, hyperlipidemia, who is admitted to [facility] on 02/10/2020 with acute hypoxic respiratory failure due to suspected community-acquired pneumonia after presenting from home to [facility] complaining of shortness of breath.   Reports approximately 1 week of progressive shortness of breath associated with new onset nonproductive cough.  He denies any associated orthopnea, PND, or recent worsening of his chronic bilateral lower extremity edema.  Denies any associated hemoptysis, calf tenderness, lower extremity erythema.  Denies any recent trauma or travel.  Denies any associated wheezing.  Denies any recent chest pain, diaphoresis, palpitations, nausea, vomiting.  Also denies any recent diarrhea, melena, or hematochezia.  He acknowledges a history of chronic heart failure, and reports good compliance with his home Lasix, without any recent dose adjustments to this diuretic.  He also conveys generalized weakness over the last 3 to 4 days, in the absence of any acute focal weakness.  Of note, most recent echocardiogram, which was performed in December 2020, showed mild LVH, LVEF 55 to 02%, grade 1 diastolic dysfunction, no evidence of focal wall motion abnormalities.  Echo also showed moderately dilated left atrium and mild mitral regurgitation.  The patient acknowledges that he is a former smoker, having smoked 1 pack/day for 20 to 25 years, before completely quitting smoking in 1996 without any subsequent resumption thereof.  He denies any known underlying chronic pulmonary pathology, including no known underlying emphysema.  Denies any baseline supplemental oxygen requirements.  Denies any associated subjective  fever, chills, rigors, or generalized myalgias. Denies any recent headache, neck stiffness, rhinitis, rhinorrhea, sore throat, or rash. No known COVID-19 exposures. Denies dysuria, gross hematuria, or change in urinary urgency/frequency.    Acknowledges a history of chronic alcohol abuse in which she reports typical daily consumption of 1 pint of rum, with most recent alcohol consumption occurring on Saturday, 02/08/2020.  He reports that he has no interest in discontinuation of his alcohol consumption at this time.  Denies any history of alcohol withdrawal symptoms when he has previously abstained from alcohol consumption.   4/13: Patient was admitted with acute hypoxemic and hypercapnic respiratory failure secondary to community-acquired pneumonia and has been started on Rocephin and azithromycin.  His COVID-19 testing has returned negative and atypical studies are still pending.  He was placed on BiPAP and progressive care unit and has been weaned down to 4 L nasal cannula oxygen at this time.  He does have a history of chronic alcoholism and has been started on alcohol supplementation with daily meals to prevent withdrawal symptoms.  Assessment & Plan:   Principal Problem:   Acute respiratory failure with hypoxia (HCC) Active Problems:   Hypertension   Hyperlipidemia   GERD (gastroesophageal reflux disease)   CAP (community acquired pneumonia)   Generalized weakness   SOB (shortness of breath)   Chronic alcohol abuse   Acute congestive heart failure (HCC)   AKI (acute kidney injury) ([place])  Acute hypoxemic and hypercapnic respiratory failure secondary to COPD exacerbation -Blood cultures with no growth thus far -Continue Rocephin and azithromycin for presumed atypical pneumonia - IV steroids, scheduled Duoneb treatments seem to be helping.  -Urine strep pneumonia negative -Covid testing negative -Wean oxygen and Bipap as tolerated -Continue breathing treatments as needed -Following  ABGs: no significant improvement so far.   -Pulmonary consultation requested.   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 -Renal US normal   Essential hypertension -Continue on Norvasc and Lopressor and Lasix   Dyslipidemia -Continue fenofibrate  Chronic diastolic heart failure -Noted to have echo on 12/20 with grade 1 diastolic dysfunction and LVEF 55-60% -Resumed Lasix -Currently appears compensated, follow  Chronic alcohol abuse -CIWA protocol, if no improvement, consider precedex infusion -reduced alcohol from scheduled to PRN -Continue thiamine, folic acid, and multivitamin supplementation  DVT prophylaxis: Heparin changed to SCDs due to thrombocytopenia Code Status: Full Family Communication: telephone call to spouse Disposition Plan: pulmonary consult appreciated, continue stepdown ICU care as he had required bipap therapy, continue IV steroids, scheduled duonebs, etc, possibly home tomorrow if stable.    Consultants:   None  Procedures:   None  Antimicrobials:  Anti-infectives (From admission, onward)   Start     Dose/Rate Route Frequency Ordered Stop   02/14/20 1700  azithromycin (ZITHROMAX) tablet 500 mg     500 mg Oral Daily 02/14/20 1109     02/11/20 1700  azithromycin (ZITHROMAX) 500 mg in sodium chloride 0.9 % 250 mL IVPB  Status:  Discontinued     500 mg 250 mL/hr over 60 Minutes Intravenous Every 24 hours 02/10/20 2034 02/14/20 1109   02/11/20 1700  cefTRIAXone (ROCEPHIN) 1 g in sodium chloride 0.9 % 100 mL IVPB     1 g 200 mL/hr over 30 Minutes Intravenous Every 24 hours 02/10/20 2034     02/10/20 1615  cefTRIAXone (ROCEPHIN) 1 g in sodium chloride 0.9 % 100 mL IVPB     1 g 200 mL/hr over 30 Minutes Intravenous  Once 02/10/20 1609 02/10/20 1705   02/10/20 1615  azithromycin (ZITHROMAX) 500 mg in sodium chloride 0.9 % 250 mL IVPB     500 mg 250 mL/hr over 60 Minutes Intravenous  Once  02/10/20 1609 02/10/20 1914      Subjective: Patient asking about going home, still on bipap this morning.  Objective: Vitals:   02/14/20 1200 02/14/20 1203 02/14/20 1449 02/14/20 1600  BP: (!) 149/83   (!) 153/91  Pulse: 82 77  78  Resp:    18  Temp:  99 F (37.2 C)  98.2 F (36.8 C)  TempSrc:  Oral  Oral  SpO2: 97% 95% 96% 94%  Weight:      Height:        Intake/Output Summary (Last 24 hours) at 02/14/2020 1728 Last data filed at 02/14/2020 1647 Gross per 24 hour  Intake 264.06 ml  Output 2100 ml  Net -1835.94 ml   Filed Weights   02/12/20 0355 02/13/20 0500 02/14/20 0500  Weight: 96.7 kg 97.2 kg 97.4 kg    Examination:  General exam: Appears calm and comfortable.  He was seen on bipap therapy.  Respiratory system: BBS with rhonchi. On bipap.   Cardiovascular system: S1 & S2 heard, RRR. No JVD, murmurs, rubs, gallops or clicks. No pedal edema. Gastrointestinal system: Abdomen is nondistended, soft and nontender. No organomegaly or masses felt. Normal bowel sounds heard. Central nervous system: Alert and oriented. No focal neurological deficits. Extremities: Symmetric 5 x 5 power. Skin: No rashes, lesions or ulcers Psychiatry: flat affect.   Data Reviewed: I have personally reviewed following labs and imaging studies  CBC: Recent Labs  Lab 02/10/20 1242 02/11/20 0557 02/12/20 0524 02/13/20 0432 02/14/20 0405  WBC  4.3 3.7* 4.8 3.9* 7.3  NEUTROABS 2.6 2.6  --   --   --   HGB 10.6* 9.3* 9.3* 8.9* 8.9*  HCT 33.3* 30.2* 29.9* 28.6* 29.0*  MCV 91.0 94.7 93.4 92.3 92.1  PLT 213 148* 210 203 117   Basic Metabolic Panel: Recent Labs  Lab 02/10/20 1242 02/10/20 1443 02/11/20 0557 02/12/20 0524 02/13/20 0432 02/14/20 0405  NA 135  --  141 139 136 138  K 3.9  --  3.9 4.1 4.4 4.6  CL 96*  --  99 98 95* 98  CO2 28  --  32 34* 33* 30  GLUCOSE 107*  --  89 113* 194* 166*  BUN 24*  --  24* 28* 31* 40*  CREATININE 2.47*  --  2.25* 2.45* 2.34* 2.41*  CALCIUM  8.6*  --  8.7* 8.8* 8.9 8.9  MG  --  2.1 1.7 1.7 1.9 2.0  PHOS  --   --  3.8  --  3.1 3.3   GFR: Estimated Creatinine Clearance: 30 mL/min (A) (by C-G formula based on SCr of 2.41 mg/dL (H)). Liver Function Tests: Recent Labs  Lab 02/10/20 1242 02/13/20 0432 02/14/20 0405  AST 24  --   --   ALT 12  --   --   ALKPHOS 30*  --   --   BILITOT 0.5  --   --   PROT 7.3  --   --   ALBUMIN 4.0 3.6 3.4*   No results for input(s): LIPASE, AMYLASE in the last 168 hours. No results for input(s): AMMONIA in the last 168 hours. Coagulation Profile: Recent Labs  Lab 02/11/20 0557  INR 1.1   Cardiac Enzymes: No results for input(s): CKTOTAL, CKMB, CKMBINDEX, TROPONINI in the last 168 hours. BNP (last 3 results) No results for input(s): PROBNP in the last 8760 hours. HbA1C: No results for input(s): HGBA1C in the last 72 hours. CBG: No results for input(s): GLUCAP in the last 168 hours. Lipid Profile: No results for input(s): CHOL, HDL, LDLCALC, TRIG, CHOLHDL, LDLDIRECT in the last 72 hours. Thyroid Function Tests: No results for input(s): TSH, T4TOTAL, FREET4, T3FREE, THYROIDAB in the last 72 hours. Anemia Panel: No results for input(s): VITAMINB12, FOLATE, FERRITIN, TIBC, IRON, RETICCTPCT in the last 72 hours. Sepsis Labs: Recent Labs  Lab 02/10/20 1443  PROCALCITON <0.10    Recent Results (from the past 240 hour(s))  SARS CORONAVIRUS 2 (TAT 6-24 HRS) Nasopharyngeal Nasopharyngeal Swab     Status: None   Collection Time: 02/10/20  2:47 PM   Specimen: Nasopharyngeal Swab  Result Value Ref Range Status   SARS Coronavirus 2 NEGATIVE NEGATIVE Final    Comment: (NOTE) SARS-CoV-2 target nucleic acids are NOT DETECTED. The SARS-CoV-2 RNA is generally detectable in upper and lower respiratory specimens during the acute phase of infection. Negative results do not preclude SARS-CoV-2 infection, do not rule out co-infections with other pathogens, and should not be used as the sole  basis for treatment or other patient management decisions. Negative results must be combined with clinical observations, patient history, and epidemiological information. The expected result is Negative. Fact Sheet for Patients: SugarRoll.be Fact Sheet for Healthcare Providers: https://www.woods-mathews.com/ This test is not yet approved or cleared by the [place] FDA and  has been authorized for detection and/or diagnosis of SARS-CoV-2 by FDA under an Emergency Use Authorization (EUA). This EUA will remain  in effect (meaning this test can be used) for the duration of the COVID-19 declaration under  Section 56 4(b)(1) of the Act, 21 U.S.C. section 360bbb-3(b)(1), unless the authorization is terminated or revoked sooner. Performed at [facility], [place] [address], [place], [place] [address]   Culture, blood (Routine X 2) w Reflex to ID Panel     Status: None (Preliminary result)   Collection Time: 02/10/20  9:07 PM   Specimen: BLOOD  Result Value Ref Range Status   Specimen Description BLOOD BLOOD LEFT ARM  Final   Special Requests   Final    BOTTLES DRAWN AEROBIC ONLY Blood Culture adequate volume   Culture   Final    NO GROWTH 4 DAYS Performed at [facility], [address], [place], [place] [address]    Report Status PENDING  Incomplete  Respiratory Panel by RT PCR (Flu A&B, Covid) - Nasopharyngeal Swab     Status: None   Collection Time: 02/10/20  9:22 PM   Specimen: Nasopharyngeal Swab  Result Value Ref Range Status   SARS Coronavirus 2 by RT PCR NEGATIVE NEGATIVE Final    Comment: (NOTE) SARS-CoV-2 target nucleic acids are NOT DETECTED. The SARS-CoV-2 RNA is generally detectable in upper respiratoy specimens during the acute phase of infection. The lowest concentration of SARS-CoV-2 viral copies this assay can detect is 131 copies/mL. A negative result does not preclude SARS-Cov-2 infection and should not be used as the  sole basis for treatment or other patient management decisions. A negative result may occur with  improper specimen collection/handling, submission of specimen other than nasopharyngeal swab, presence of viral mutation(s) within the areas targeted by this assay, and inadequate number of viral copies (<131 copies/mL). A negative result must be combined with clinical observations, patient history, and epidemiological information. The expected result is Negative. Fact Sheet for Patients:  PinkCheek.be Fact Sheet for Healthcare Providers:  GravelBags.it This test is not yet ap proved or cleared by the [place] FDA and  has been authorized for detection and/or diagnosis of SARS-CoV-2 by FDA under an Emergency Use Authorization (EUA). This EUA will remain  in effect (meaning this test can be used) for the duration of the COVID-19 declaration under Section 564(b)(1) of the Act, 21 U.S.C. section 360bbb-3(b)(1), unless the authorization is terminated or revoked sooner.    Influenza A by PCR NEGATIVE NEGATIVE Final   Influenza B by PCR NEGATIVE NEGATIVE Final    Comment: (NOTE) The Xpert Xpress SARS-CoV-2/FLU/RSV assay is intended as an aid in  the diagnosis of influenza from Nasopharyngeal swab specimens and  should not be used as a sole basis for treatment. Nasal washings and  aspirates are unacceptable for Xpert Xpress SARS-CoV-2/FLU/RSV  testing. Fact Sheet for Patients: PinkCheek.be Fact Sheet for Healthcare Providers: GravelBags.it This test is not yet approved or cleared by the [place] FDA and  has been authorized for detection and/or diagnosis of SARS-CoV-2 by  FDA under an Emergency Use Authorization (EUA). This EUA will remain  in effect (meaning this test can be used) for the duration of the  Covid-19 declaration under Section 564(b)(1) of the Act, 21    U.S.C. section 360bbb-3(b)(1), unless the authorization is  terminated or revoked. Performed at [facility], [address] [place], [place] [address]   Culture, blood (Routine X 2) w Reflex to ID Panel     Status: None (Preliminary result)   Collection Time: 02/10/20  9:32 PM   Specimen: BLOOD RIGHT FOREARM  Result Value Ref Range Status   Specimen Description BLOOD RIGHT FOREARM  Final  Special Requests   Final    BOTTLES DRAWN AEROBIC AND ANAEROBIC Blood Culture adequate volume   Culture   Final    NO GROWTH 4 DAYS Performed at [facility], [address], [place], [place] [address]    Report Status PENDING  Incomplete  MRSA PCR Screening     Status: None   Collection Time: 02/10/20 10:35 PM   Specimen: Nasal Mucosa; Nasopharyngeal  Result Value Ref Range Status   MRSA by PCR NEGATIVE NEGATIVE Final    Comment:        The GeneXpert MRSA Assay (FDA approved for NASAL specimens only), is one component of a comprehensive MRSA colonization surveillance program. It is not intended to diagnose MRSA infection nor to guide or monitor treatment for MRSA infections. Performed at [facility], [address], [place], [place] [address]     Radiology Studies: DG CHEST PORT 1 VIEW  Result Date: 02/13/2020 CLINICAL DATA:  Acute on chronic respiratory failure, shortness of breath, history hypertension, pulmonary embolism, former smoker, GERD EXAM: PORTABLE CHEST 1 VIEW COMPARISON:  Portable exam 1006 hours compared to 02/10/2020 FINDINGS: Enlargement of cardiac silhouette. Prominent LEFT paratracheal soft tissues at superior mediastinum, not identified on studies of 02/10/2020 and 02/03/2020, suspect due to slight rotation of the shoulders. Pulmonary vascularity normal. Bibasilar opacities, favor atelectasis on RIGHT and atelectasis versus infiltrate on LEFT. Upper lungs otherwise clear. No pleural effusion or pneumothorax. IMPRESSION: RIGHT basilar atelectasis with additional atelectasis  versus consolidation in LEFT lower lobe. Enlargement of cardiac silhouette. Prominent LEFT paratracheal soft tissues at superior mediastinum, probably projectional since not definitely visualized on studies of 02/10/2020 or 02/03/2020; follow-up upright PA and lateral chest radiographs recommended for further assessment when patient's clinical condition permits. Electronically Signed   By: Lavonia Dana M.D.   On: 02/13/2020 10:25   Scheduled Meds: . allopurinol  100 mg Oral Daily  . amLODipine  10 mg Oral Daily  . atorvastatin  40 mg Oral Daily  . azithromycin  500 mg Oral Daily  . Chlorhexidine Gluconate Cloth  6 each Topical Daily  . fenofibrate  160 mg Oral Daily  . folic acid  1 mg Oral Daily  . furosemide  40 mg Oral Daily  . hydrALAZINE  50 mg Oral Q8H  . ipratropium-albuterol  3 mL Nebulization TID  . LORazepam  0-4 mg Intravenous Q6H   Followed by  . [START ON 02/15/2020] LORazepam  0-4 mg Intravenous Q12H  . methylPREDNISolone (SOLU-MEDROL) injection  40 mg Intravenous Q12H  . metoprolol tartrate  50 mg Oral BID  . multivitamin with minerals  1 tablet Oral Daily  . pantoprazole  40 mg Oral Daily  . sodium chloride flush  3 mL Intravenous Q12H  . thiamine  100 mg Oral Daily   Continuous Infusions: . sodium chloride    . cefTRIAXone (ROCEPHIN)  IV 200 mL/hr at 02/14/20 1647     LOS: 4 days   Critical Care Procedure Note Authorized and Performed by: Murvin Natal MD  Total Critical Care time:  31 mins Due to a high probability of clinically significant, life threatening deterioration, the patient required my highest level of preparedness to intervene emergently and I personally spent this critical care time directly and personally managing the patient.  This critical care time included obtaining a history; examining the patient, pulse oximetry; ordering and review of studies; arranging urgent treatment with development of a management plan; evaluation of patient's response of  treatment; frequent reassessment; and discussions  with other providers.  This critical care time was performed to assess and manage the high probability of imminent and life threatening deterioration that could result in multi-organ failure.  It was exclusive of separately billable procedures and treating other patients and teaching time.    Irwin Brakeman, MD How to contact the [facility] Attending or Consulting provider [place] or covering provider during after hours [place], for this patient?  1. Check the care team in [facility] and look for a) attending/consulting [facility] provider listed and b) the [facility] team listed 2. Log into [email or web address] and use [place]'s universal password to access. If you do not have the password, please contact the hospital operator. 3. Locate the [facility] provider you are looking for under [facility] and page to a number that you can be directly reached. 4. If you still have difficulty reaching the provider, please page the [facility] (Director on Call) for the Hospitalists listed on amion for assistance.  02/14/2020, 5:28 PM

## 2020-02-14 NOTE — Care Management Important Message (Signed)
Important Message  Patient Details  Name: Andrew Adams MRN: [id number] Date of Birth: 1946/04/11   Medicare Important Message Given:  Yes     Tommy Medal 02/14/2020, 2:48 PM

## 2020-02-14 NOTE — Progress Notes (Signed)
Patient has lost IV access, will not allow any further IV insertion attempts. Patient aware of need for IV if emergency were to occur. Midlevel aware.

## 2020-02-14 NOTE — Progress Notes (Signed)
Questioned patient about BiPAP:" he basically stated they said he did not have to wear tonight if he didn't wish to".  So unless encounter any problems will not try BiPAP at HS.

## 2020-02-14 NOTE — Progress Notes (Signed)
PHARMACIST - PHYSICIAN COMMUNICATION DR:   Wynetta Emery CONCERNING: Antibiotic  IV -to -Oral Route Change Policy  RECOMMENDATION: This patient is receiving azithromycin by the intravenous route.  Based on criteria approved by the Pharmacy and Therapeutics Committee, the antibiotic(s) is/are being converted to the equivalent oral dose form(s).   DESCRIPTION: These criteria include:  Patient being treated for a respiratory tract infection, urinary tract infection, cellulitis or clostridium difficile associated diarrhea if on metronidazole  The patient is not neutropenic and does not exhibit a GI malabsorption state  The patient is eating (either orally or via tube) and/or has been taking other orally administered medications for a least 24 hours  The patient is improving clinically and has a Tmax < 100.5  If you have questions about this conversion, please contact the Pharmacy Department  [x]   [phone] )  [facility] []   [phone] )  [facility]  []   [phone] )  [facility] []   [phone] )  [facility]

## 2020-02-14 NOTE — Progress Notes (Signed)
NAME:  Andrew Adams, MRN:  [id number], DOB:  01-Apr-1946, LOS: 4 ADMISSION DATE:  02/10/2020, CONSULTATION DATE:  02/13/2020 REFERRING MD:  Dr. Wynetta Emery, [facility], CHIEF COMPLAINT:  Short of breath  Brief History   74 yo male former smoker presented to ER with cough and dyspnea for 1 week.  Found to have hypoxic/hypercapnic respiratory failure from pneumonia.    History of present illness   74 yo male former smoker presented with 1 week of cough, dyspnea.  Found have hypoxia.  CXR showed infiltrate.  Started on ABx for CAP.  Has hx of ETOH and drinks 1 pint of rum per day.  Also treated with steroids and BDs for presumed COPD exacerbation.  Had persistent hypoxia and increased WOB and started on Bipap.  He was agitated overnight.  Received ativan and haldol, and not somnolent.  Past Medical History  Diastolic CHF, ETOH, HTN, HLD, PE Sept 2013, Gout, GERD, CKD 3b  Significant Hospital Events   4/12 Admit 4/14 Add steroids, start Bipap 4/16 off Bipap  Consults:    Procedures:    Significant Diagnostic Tests:  Echo 10/30/19 >> EF 55 to 60%, grade 1 DD, mod LA dilation Renal u/s 02/12/20 >> normal  Micro Data:  SARS CoV2 Ag 4/12 >> negative SARS CoV2 TAT 4/12 >> negative Pneumococcal Ag 4/12 >> negative Legionella Ag 4/12 >> negative Influenza PCR 4/12 >> negative  Antimicrobials:  Rocephin 4/12 >> Zithromax 4/12 >>   Interim history/subjective:  He doesn't remember what happened.  Feels better.  Wants to know when he can go home.  Objective   BP (!) 149/83   Pulse 77   Temp 99 F (37.2 C) (Oral)   Resp (!) 22   Ht 5\' 9"  (1.753 m)   Wt 97.4 kg   SpO2 95%   BMI 31.71 kg/m   I/O last 3 completed shifts: In: 660.6 [P.O.:240; I.V.:3; IV Piggyback:417.6] Out: 1850 [Urine:1850]  Examination:  General - alert, sitting in chair Eyes - pupils reactive ENT - no sinus tenderness, no stridor Cardiac - regular rate/rhythm, no murmur Chest - scattered rhonchi, no  wheeze Abdomen - soft, non tender, + bowel sounds Extremities - no cyanosis, clubbing, or edema Skin - no rashes Neuro - normal strength, moves extremities, follows commands Psych - normal mood and behavior  Resolved Hospital Problem list     Assessment & Plan:   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 - day 5 of ABx - goal SpO2 90 to 95% - prn Bipap - likely can change solumedrol to prednisone soon - scheduled BDs  Agitated delirium with hx of ETOH. - improved 4/16 - CIWA - thiamine, folic acid  Chronic diastolic CHF, HTN, HLD. - continue norvasc, lipitor, fenofibrate, lasix, hydralazine, lopressor  CKD 3b. - baseline creatinine 2.92 from 01/03/20 - f/u BMET  Anemia of chronic disease. - f/u CBC - transfuse for Hb < 7 or significant bleeding  Hx of gout. - continue allopurinol  Best practice:  Diet: heart healthy DVT prophylaxis: SCDs GI prophylaxis: protonix Mobility: PT/OT Code Status: Full code Disposition: ICU/SDU  PCCM will sign off.  Please call if additional help needed while he is in hospital.  Labs:   CMP Latest Ref Rng & Units 02/14/2020 02/13/2020 02/12/2020  Glucose 70 - 99 mg/dL 166(H) 194(H) 113(H)  BUN 8 - 23 mg/dL 40(H) 31(H) 28(H)  Creatinine 0.61 - 1.24 mg/dL 2.41(H) 2.34(H) 2.45(H)  Sodium 135 - 145 mmol/L  138 136 139  Potassium 3.5 - 5.1 mmol/L 4.6 4.4 4.1  Chloride 98 - 111 mmol/L 98 95(L) 98  CO2 22 - 32 mmol/L 30 33(H) 34(H)  Calcium 8.9 - 10.3 mg/dL 8.9 8.9 8.8(L)  Total Protein 6.5 - 8.1 g/dL - - -  Total Bilirubin 0.3 - 1.2 mg/dL - - -  Alkaline Phos 38 - 126 U/L - - -  AST 15 - 41 U/L - - -  ALT 0 - 44 U/L - - -    CBC Latest Ref Rng & Units 02/14/2020 02/13/2020 02/12/2020  WBC 4.0 - 10.5 K/uL 7.3 3.9(L) 4.8  Hemoglobin 13.0 - 17.0 g/dL 8.9(L) 8.9(L) 9.3(L)  Hematocrit 39.0 - 52.0 % 29.0(L) 28.6(L) 29.9(L)  Platelets 150 - 400 K/uL 162 203 210    ABG    Component Value  Date/Time   PHART 7.342 (L) 02/14/2020 0547   PCO2ART 66.5 (HH) 02/14/2020 0547   PO2ART 70.3 (L) 02/14/2020 0547   HCO3 32.1 (H) 02/14/2020 0547   O2SAT 92.3 02/14/2020 0547   Signature:    Chesley Mires, MD [place] Pager - [phone] 02/14/2020, 12:59 PM

## 2020-02-15 LAB — CULTURE, BLOOD (ROUTINE X 2)
Culture: NO GROWTH
Culture: NO GROWTH
Special Requests: ADEQUATE
Special Requests: ADEQUATE

## 2020-02-15 MED ORDER — THIAMINE HCL 100 MG PO TABS: 100.0000 mg | ORAL_TABLET | Freq: Every day | ORAL | 0 refills | Status: AC

## 2020-02-15 MED ORDER — DOXYCYCLINE HYCLATE 100 MG PO CAPS: 100.0000 mg | ORAL_CAPSULE | Freq: Two times a day (BID) | ORAL | 0 refills | Status: AC

## 2020-02-15 MED ORDER — PREDNISONE 20 MG PO TABS: ORAL_TABLET | ORAL | 0 refills | Status: DC

## 2020-02-15 MED ORDER — FOLIC ACID 1 MG PO TABS: 1.0000 mg | ORAL_TABLET | Freq: Every day | ORAL | 0 refills | Status: DC

## 2020-02-15 MED ORDER — ADULT MULTIVITAMIN W/MINERALS CH: 1.0000 | ORAL_TABLET | Freq: Every day | ORAL | Status: AC

## 2020-02-15 MED ORDER — IPRATROPIUM-ALBUTEROL 0.5-2.5 (3) MG/3ML IN SOLN: 3.0000 mL | Freq: Three times a day (TID) | RESPIRATORY_TRACT | 0 refills | Status: DC

## 2020-02-15 NOTE — TOC Transition Note (Signed)
Transition of Care [facility]) - CM/SW Discharge Note   Patient Details  Name: Andrew Adams MRN: [id number] Date of Birth: 1946-04-13  [facility] CM/SW Contact:  Shade Flood, LCSW Phone Number: 02/15/2020, 12:26 PM   Clinical Narrative:     Pt stable for dc today per MD. MD ordered HHPT, Home O2, and neb machine. Unable to reach pt by phone. Spoke with pt's wife by phone. She agrees to [facility] for pt. She is agreeable to any provider. Also informed pt's wife of need for O2 and nebulizer. Referred to [employer]. DME will be delivered to pt's room prior to dc.  Updated pt's RN. Pt can dc home once DME arrives.  There are no other TOC needs for dc.  Final next level of care: [place] Barriers to Discharge: Barriers Resolved   Patient Goals and CMS Choice Patient states their goals for this hospitalization and ongoing recovery are:: to go home. CMS Medicare.gov Compare Post Acute Care list provided to:: Patient Represenative (must comment) Choice offered to / list presented to : Spouse  Discharge Placement                       Discharge Plan and Services                DME Arranged: Oxygen, Nebulizer machine DME Agency: [employer] Date DME Agency Contacted: 02/15/20   Representative spoke with at DME Agency: [place]: PT Flasher Agency: [place] Date [employer]: 02/15/20   Representative spoke with at [place]: [place] ([place]) Interventions     Readmission Risk Interventions No flowsheet data found.

## 2020-02-15 NOTE — Discharge Summary (Signed)
Physician Discharge Summary  Andrew Adams SEG:[id number] DOB: 12-14-1945 DOA: 02/10/2020  PCP: Lavella Lemons, PA  Admit date: 02/10/2020 Discharge date: 02/15/2020  Admitted From:  HOME  Disposition: HOME   Recommendations for Outpatient Follow-up:  1. Follow up with PCP in 1 weeks 2. Follow up with   Discharge Condition: STABLE CODE STATUS: FULL    Brief Hospitalization Summary: Please see all hospital notes, images, labs for full details of the hospitalization. ADMISSION HPI: Andrew Adams is a 74 y.o. male with medical history significant for chronic diastolic heart failure, chronic alcohol abuse, hypertension, hyperlipidemia, who is admitted to [facility] on 02/10/2020 with acute hypoxic respiratory failure due to suspected community-acquired pneumonia after presenting from home to [facility] Emergency Department complaining of shortness of breath.   Reports approximately 1 week of progressive shortness of breath associated with new onset nonproductive cough.  He denies any associated orthopnea, PND, or recent worsening of his chronic bilateral lower extremity edema.  Denies any associated hemoptysis, calf tenderness, lower extremity erythema.  Denies any recent trauma or travel.  Denies any associated wheezing.  Denies any recent chest pain, diaphoresis, palpitations, nausea, vomiting.  Also denies any recent diarrhea, melena, or hematochezia.  He acknowledges a history of chronic heart failure, and reports good compliance with his home Lasix, without any recent dose adjustments to this diuretic.  He also conveys generalized weakness over the last 3 to 4 days, in the absence of any acute focal weakness.  Of note, most recent echocardiogram, which was performed in December 2020, showed mild LVH, LVEF 55 to 73%, grade 1 diastolic dysfunction, no evidence of focal wall motion abnormalities.  Echo also showed moderately dilated left atrium and mild mitral regurgitation.  The patient  acknowledges that he is a former smoker, having smoked 1 pack/day for 20 to 25 years, before completely quitting smoking in 1996 without any subsequent resumption thereof.  He denies any known underlying chronic pulmonary pathology, including no known underlying emphysema.  Denies any baseline supplemental oxygen requirements.  Denies any associated subjective fever, chills, rigors, or generalized myalgias. Denies any recent headache, neck stiffness, rhinitis, rhinorrhea, sore throat, or rash. No known COVID-19 exposures. Denies dysuria, gross hematuria, or change in urinary urgency/frequency.    Acknowledges a history of chronic alcohol abuse in which she reports typical daily consumption of 1 pint of rum, with most recent alcohol consumption occurring on Saturday, 02/08/2020.  He reports that he has no interest in discontinuation of his alcohol consumption at this time.  Denies any history of alcohol withdrawal symptoms when he has previously abstained from alcohol consumption.   ED Course:  Vital signs in the ED were notable for the following: Temperature max 98.0; heart rate 74-85; blood pressure 154/97 - 164/94; respiratory rate 20-25; initial oxygen saturation to be 86% on room air, which is improved to 99 to 100% on 4 L nasal cannula.  Labs were notable for the following: CMP notable for the following: Sodium 135, potassium 3.9, bicarbonate 28, BUN 24, creatinine 2.47 relative to most recent prior value of 2.91 01/03/2020, S1 who 2, and liver enzymes were found to be within normal limits.  BNP noted to be 537, with no prior BNP value available for point comparison.  Initial high-sensitivity troponin I found to be 11, with subsequent value found to be trending down to 10.  CBC notable for white blood cell count of 4300 with 59% neutrophils, hemoglobin 10.6.  Nasopharyngeal COVID-19 PCR was obtained in the  ED this evening, with result currently pending.  Chest x-ray showed evidence of bilateral  airspace opacities, potentially consistent with atypical pneumonia in the absence of any evidence of edema or pneumothorax.  EKG showed sinus rhythm with PAC and ventricular rate of 75, with nonspecific T wave inversion in leads III and V1, of which to inversion in V1 was unchanged relative to most recent prior EKG from 01/03/2020.  This evening's EKG also showed no evidence of ST changes, including no evidence of ST elevation.  While in the ED, the following were administered: Azithromycin 500 mg IV x1, Rocephin 1 g IV x1, Lasix 40 mg IV x1, and albuterol inhaler x8 puffs.   Acute hypoxemic and hypercapnic respiratory failure secondary to COPD exacerbation -Blood cultures with no growth thus far -Continue Rocephin and azithromycin for presumed atypical pneumonia - IV steroids, scheduled Duoneb treatments seem to be helping.  -Urine strep pneumonia negative -Covid testing negative -Wean oxygen and Bipap as tolerated -Continue breathing treatments as needed -Following ABGs: no significant improvement so far.   -Pulmonary consultation requested.   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 -Renal US normal   Essential hypertension -Continue on Norvasc and Lopressor and Lasix   Dyslipidemia -Continue fenofibrate  Chronic diastolic heart failure -Noted to have echo on 12/20 with grade 1 diastolic dysfunction and LVEF 55-60% -Resumed Lasix -Currently appears compensated, follow  Chronic alcohol abuse -CIWA protocol used  -Continue thiamine, folic acid, and multivitamin supplementation  DVT prophylaxis: Heparin changed to SCDs due to thrombocytopenia Code Status: Full Family Communication: telephone call to spouse Disposition Plan: Home with wife  Consultants:   PCCM  Discharge Diagnoses:  Principal Problem:   Acute respiratory failure with hypoxia ([place]) Active Problems:   Hypertension   Hyperlipidemia    GERD (gastroesophageal reflux disease)   CAP (community acquired pneumonia)   Generalized weakness   SOB (shortness of breath)   Chronic alcohol abuse   Acute congestive heart failure ([place])   AKI (acute kidney injury) [facility])   Discharge Instructions: Discharge Instructions    Increase activity slowly   Complete by: As directed      Allergies as of 02/15/2020   No Known Allergies     Medication List    TAKE these medications   allopurinol 100 MG tablet Commonly known as: ZYLOPRIM Take 1 tablet (100 mg total) by mouth daily.   amLODipine 10 MG tablet Commonly known as: NORVASC Take 5 mg by mouth daily.   atorvastatin 40 MG tablet Commonly known as: LIPITOR Take 1 tablet (40 mg total) by mouth daily.   doxycycline 100 MG capsule Commonly known as: VIBRAMYCIN Take 1 capsule (100 mg total) by mouth 2 (two) times daily for 3 days.   fenofibrate 145 MG tablet Commonly known as: TRICOR Take 145 mg by mouth daily.   folic acid 1 MG tablet Commonly known as: FOLVITE Take 1 tablet (1 mg total) by mouth daily. Start taking on: February 16, 2020   furosemide 40 MG tablet Commonly known as: LASIX Take 40 mg by mouth.   ipratropium-albuterol 0.5-2.5 (3) MG/3ML Soln Commonly known as: DUONEB Take 3 mLs by nebulization 3 (three) times daily.   losartan 50 MG tablet Commonly known as: COZAAR Take 50 mg by mouth daily.   metoprolol tartrate 50 MG tablet Commonly known as: LOPRESSOR Take 1 tablet (50 mg total) by mouth 2 (two) times daily.   multivitamin with minerals Tabs  tablet Take 1 tablet by mouth daily. Start taking on: February 16, 2020   pantoprazole 40 MG tablet Commonly known as: PROTONIX Take 1 tablet (40 mg total) by mouth daily.   predniSONE 20 MG tablet Commonly known as: DELTASONE Take 3 PO QAM x3days, 2 PO QAM x3days, 1 PO QAM x3days Start taking on: February 16, 2020   thiamine 100 MG tablet Take 1 tablet (100 mg total) by mouth daily. Start taking on:  February 16, 2020            Durable Medical Equipment  (From admission, onward)         Start     Ordered   02/15/20 1047  For home use only DME Nebulizer machine  Once    Question Answer Comment  Patient needs a nebulizer to treat with the following condition COPD (chronic obstructive pulmonary disease) ([place])   Length of Need Lifetime      02/15/20 1047   02/15/20 1042  For home use only DME oxygen  Once    Question Answer Comment  Length of Need Lifetime   Mode or (Route) Nasal cannula   Liters per Minute 2   Frequency Continuous (stationary and portable oxygen unit needed)   Oxygen conserving device Yes   Oxygen delivery system Gas      02/15/20 1041          No Known Allergies Allergies as of 02/15/2020   No Known Allergies     Medication List    TAKE these medications   allopurinol 100 MG tablet Commonly known as: ZYLOPRIM Take 1 tablet (100 mg total) by mouth daily.   amLODipine 10 MG tablet Commonly known as: NORVASC Take 5 mg by mouth daily.   atorvastatin 40 MG tablet Commonly known as: LIPITOR Take 1 tablet (40 mg total) by mouth daily.   doxycycline 100 MG capsule Commonly known as: VIBRAMYCIN Take 1 capsule (100 mg total) by mouth 2 (two) times daily for 3 days.   fenofibrate 145 MG tablet Commonly known as: TRICOR Take 145 mg by mouth daily.   folic acid 1 MG tablet Commonly known as: FOLVITE Take 1 tablet (1 mg total) by mouth daily. Start taking on: February 16, 2020   furosemide 40 MG tablet Commonly known as: LASIX Take 40 mg by mouth.   ipratropium-albuterol 0.5-2.5 (3) MG/3ML Soln Commonly known as: DUONEB Take 3 mLs by nebulization 3 (three) times daily.   losartan 50 MG tablet Commonly known as: COZAAR Take 50 mg by mouth daily.   metoprolol tartrate 50 MG tablet Commonly known as: LOPRESSOR Take 1 tablet (50 mg total) by mouth 2 (two) times daily.   multivitamin with minerals Tabs tablet Take 1 tablet by mouth  daily. Start taking on: February 16, 2020   pantoprazole 40 MG tablet Commonly known as: PROTONIX Take 1 tablet (40 mg total) by mouth daily.   predniSONE 20 MG tablet Commonly known as: DELTASONE Take 3 PO QAM x3days, 2 PO QAM x3days, 1 PO QAM x3days Start taking on: February 16, 2020   thiamine 100 MG tablet Take 1 tablet (100 mg total) by mouth daily. Start taking on: February 16, 2020            Durable Medical Equipment  (From admission, onward)         Start     Ordered   02/15/20 1047  For home use only DME Nebulizer machine  Once    Question Answer Comment  Patient needs a nebulizer to treat with the following condition COPD (chronic obstructive pulmonary disease) ([place])   Length of Need Lifetime      02/15/20 1047   02/15/20 1042  For home use only DME oxygen  Once    Question Answer Comment  Length of Need Lifetime   Mode or (Route) Nasal cannula   Liters per Minute 2   Frequency Continuous (stationary and portable oxygen unit needed)   Oxygen conserving device Yes   Oxygen delivery system Gas      02/15/20 1041          Procedures/Studies: US RENAL  Result Date: 02/12/2020 CLINICAL DATA:  Acute kidney injury. EXAM: RENAL / URINARY TRACT ULTRASOUND COMPLETE COMPARISON:  None. FINDINGS: Right Kidney: Renal measurements: 11.6 x 6.3 x 5.7 cm = volume: 220 mL . Echogenicity within normal limits. No mass or hydronephrosis visualized. Left Kidney: Renal measurements: 12.0 x 6.0 x 5.0 cm = volume: 195 mL. Echogenicity within normal limits. No mass or hydronephrosis visualized. Bladder: Appears normal for degree of bladder distention. Ureteral jets are not visualized. Calculated prevoid volume of 54 mL. Other: None. IMPRESSION: Normal renal ultrasound. Electronically Signed   By: Marijo Conception M.D.   On: 02/12/2020 13:55   DG CHEST PORT 1 VIEW  Result Date: 02/13/2020 CLINICAL DATA:  Acute on chronic respiratory failure, shortness of breath, history hypertension,  pulmonary embolism, former smoker, GERD EXAM: PORTABLE CHEST 1 VIEW COMPARISON:  Portable exam 1006 hours compared to 02/10/2020 FINDINGS: Enlargement of cardiac silhouette. Prominent LEFT paratracheal soft tissues at superior mediastinum, not identified on studies of 02/10/2020 and 02/03/2020, suspect due to slight rotation of the shoulders. Pulmonary vascularity normal. Bibasilar opacities, favor atelectasis on RIGHT and atelectasis versus infiltrate on LEFT. Upper lungs otherwise clear. No pleural effusion or pneumothorax. IMPRESSION: RIGHT basilar atelectasis with additional atelectasis versus consolidation in LEFT lower lobe. Enlargement of cardiac silhouette. Prominent LEFT paratracheal soft tissues at superior mediastinum, probably projectional since not definitely visualized on studies of 02/10/2020 or 02/03/2020; follow-up upright PA and lateral chest radiographs recommended for further assessment when patient's clinical condition permits. Electronically Signed   By: Lavonia Dana M.D.   On: 02/13/2020 10:25   DG Chest Port 1 View  Result Date: 02/10/2020 CLINICAL DATA:  Shortness of breath. EXAM: PORTABLE CHEST 1 VIEW COMPARISON:  02/03/2020 FINDINGS: The cardiac silhouette is borderline enlarged. Lung volumes are lower than on the prior study. Mild peribronchial thickening is mildly more prominent than on the prior study, and there are scattered ill-defined opacities bilaterally. A small left pleural effusion is questioned. No pneumothorax is identified. No acute osseous abnormality is seen. IMPRESSION: Mild peribronchial thickening with scattered ill-defined opacities bilaterally which may reflect infection (including atypical and viral etiologies). Electronically Signed   By: Logan Bores M.D.   On: 02/10/2020 13:20      Subjective: Pt says he feels better and wants to go home.   Discharge Exam: Vitals:   02/15/20 1014 02/15/20 1030  BP: (!) 148/85   Pulse: 76 72  Resp: (!) 22 (!) 22   Temp:    SpO2: 97% 96%   Vitals:   02/15/20 0633 02/15/20 0741 02/15/20 1014 02/15/20 1030  BP: (!) 154/87  (!) 148/85   Pulse:   76 72  Resp:   (!) 22 (!) 22  Temp:      TempSrc:      SpO2:  99% 97% 96%  Weight:      Height:  General: Pt is alert, awake, not in acute distress Cardiovascular: RRR, S1/S2 +, no rubs, no gallops Respiratory: rare exp wheezing, no rhonchi, better air movement Abdominal: Soft, NT, ND, bowel sounds + Extremities: no edema, no cyanosis   The results of significant diagnostics from this hospitalization (including imaging, microbiology, ancillary and laboratory) are listed below for reference.     Microbiology: Recent Results (from the past 240 hour(s))  SARS CORONAVIRUS 2 (TAT 6-24 HRS) Nasopharyngeal Nasopharyngeal Swab     Status: None   Collection Time: 02/10/20  2:47 PM   Specimen: Nasopharyngeal Swab  Result Value Ref Range Status   SARS Coronavirus 2 NEGATIVE NEGATIVE Final    Comment: (NOTE) SARS-CoV-2 target nucleic acids are NOT DETECTED. The SARS-CoV-2 RNA is generally detectable in upper and lower respiratory specimens during the acute phase of infection. Negative results do not preclude SARS-CoV-2 infection, do not rule out co-infections with other pathogens, and should not be used as the sole basis for treatment or other patient management decisions. Negative results must be combined with clinical observations, patient history, and epidemiological information. The expected result is Negative. Fact Sheet for Patients: SugarRoll.be Fact Sheet for Healthcare Providers: https://www.woods-mathews.com/ This test is not yet approved or cleared by the [place] FDA and  has been authorized for detection and/or diagnosis of SARS-CoV-2 by FDA under an Emergency Use Authorization (EUA). This EUA will remain  in effect (meaning this test can be used) for the duration of the COVID-19  declaration under Section 56 4(b)(1) of the Act, 21 U.S.C. section 360bbb-3(b)(1), unless the authorization is terminated or revoked sooner. Performed at [facility], [place] [address] [place], [place] [address]   Culture, blood (Routine X 2) w Reflex to ID Panel     Status: None   Collection Time: 02/10/20  9:07 PM   Specimen: BLOOD  Result Value Ref Range Status   Specimen Description BLOOD BLOOD LEFT ARM  Final   Special Requests   Final    BOTTLES DRAWN AEROBIC ONLY Blood Culture adequate volume   Culture   Final    NO GROWTH 5 DAYS Performed at [facility], [address], [place], [place] [address]    Report Status 02/15/2020 FINAL  Final  Respiratory Panel by RT PCR (Flu A&B, Covid) - Nasopharyngeal Swab     Status: None   Collection Time: 02/10/20  9:22 PM   Specimen: Nasopharyngeal Swab  Result Value Ref Range Status   SARS Coronavirus 2 by RT PCR NEGATIVE NEGATIVE Final    Comment: (NOTE) SARS-CoV-2 target nucleic acids are NOT DETECTED. The SARS-CoV-2 RNA is generally detectable in upper respiratoy specimens during the acute phase of infection. The lowest concentration of SARS-CoV-2 viral copies this assay can detect is 131 copies/mL. A negative result does not preclude SARS-Cov-2 infection and should not be used as the sole basis for treatment or other patient management decisions. A negative result may occur with  improper specimen collection/handling, submission of specimen other than nasopharyngeal swab, presence of viral mutation(s) within the areas targeted by this assay, and inadequate number of viral copies (<131 copies/mL). A negative result must be combined with clinical observations, patient history, and epidemiological information. The expected result is Negative. Fact Sheet for Patients:  PinkCheek.be Fact Sheet for Healthcare Providers:  GravelBags.it This test is not yet ap proved or  cleared by the [place] FDA and  has been authorized for detection and/or diagnosis of SARS-CoV-2 by FDA under an Emergency Use  Authorization (EUA). This EUA will remain  in effect (meaning this test can be used) for the duration of the COVID-19 declaration under Section 564(b)(1) of the Act, 21 U.S.C. section 360bbb-3(b)(1), unless the authorization is terminated or revoked sooner.    Influenza A by PCR NEGATIVE NEGATIVE Final   Influenza B by PCR NEGATIVE NEGATIVE Final    Comment: (NOTE) The Xpert Xpress SARS-CoV-2/FLU/RSV assay is intended as an aid in  the diagnosis of influenza from Nasopharyngeal swab specimens and  should not be used as a sole basis for treatment. Nasal washings and  aspirates are unacceptable for Xpert Xpress SARS-CoV-2/FLU/RSV  testing. Fact Sheet for Patients: PinkCheek.be Fact Sheet for Healthcare Providers: GravelBags.it This test is not yet approved or cleared by the [place] FDA and  has been authorized for detection and/or diagnosis of SARS-CoV-2 by  FDA under an Emergency Use Authorization (EUA). This EUA will remain  in effect (meaning this test can be used) for the duration of the  Covid-19 declaration under Section 564(b)(1) of the Act, 21  U.S.C. section 360bbb-3(b)(1), unless the authorization is  terminated or revoked. Performed at [facility], [address], [place], [place] [address]   Culture, blood (Routine X 2) w Reflex to ID Panel     Status: None   Collection Time: 02/10/20  9:32 PM   Specimen: BLOOD RIGHT FOREARM  Result Value Ref Range Status   Specimen Description BLOOD RIGHT FOREARM  Final   Special Requests   Final    BOTTLES DRAWN AEROBIC AND ANAEROBIC Blood Culture adequate volume   Culture   Final    NO GROWTH 5 DAYS Performed at [facility], [address], [place], [place] [address]    Report Status 02/15/2020 FINAL  Final  MRSA PCR Screening      Status: None   Collection Time: 02/10/20 10:35 PM   Specimen: Nasal Mucosa; Nasopharyngeal  Result Value Ref Range Status   MRSA by PCR NEGATIVE NEGATIVE Final    Comment:        The GeneXpert MRSA Assay (FDA approved for NASAL specimens only), is one component of a comprehensive MRSA colonization surveillance program. It is not intended to diagnose MRSA infection nor to guide or monitor treatment for MRSA infections. Performed at [facility], [address] [place], [place] [address]      Labs: BNP (last 3 results) Recent Labs    02/10/20 1243  BNP 993.7*   Basic Metabolic Panel: Recent Labs  Lab 02/10/20 1242 02/10/20 1443 02/11/20 0557 02/12/20 0524 02/13/20 0432 02/14/20 0405  NA 135  --  141 139 136 138  K 3.9  --  3.9 4.1 4.4 4.6  CL 96*  --  99 98 95* 98  CO2 28  --  32 34* 33* 30  GLUCOSE 107*  --  89 113* 194* 166*  BUN 24*  --  24* 28* 31* 40*  CREATININE 2.47*  --  2.25* 2.45* 2.34* 2.41*  CALCIUM 8.6*  --  8.7* 8.8* 8.9 8.9  MG  --  2.1 1.7 1.7 1.9 2.0  PHOS  --   --  3.8  --  3.1 3.3   Liver Function Tests: Recent Labs  Lab 02/10/20 1242 02/13/20 0432 02/14/20 0405  AST 24  --   --   ALT 12  --   --   ALKPHOS 30*  --   --   BILITOT 0.5  --   --   PROT 7.3  --   --  ALBUMIN 4.0 3.6 3.4*   No results for input(s): LIPASE, AMYLASE in the last 168 hours. No results for input(s): AMMONIA in the last 168 hours. CBC: Recent Labs  Lab 02/10/20 1242 02/11/20 0557 02/12/20 0524 02/13/20 0432 02/14/20 0405  WBC 4.3 3.7* 4.8 3.9* 7.3  NEUTROABS 2.6 2.6  --   --   --   HGB 10.6* 9.3* 9.3* 8.9* 8.9*  HCT 33.3* 30.2* 29.9* 28.6* 29.0*  MCV 91.0 94.7 93.4 92.3 92.1  PLT 213 148* 210 203 162   Cardiac Enzymes: No results for input(s): CKTOTAL, CKMB, CKMBINDEX, TROPONINI in the last 168 hours. BNP: Invalid input(s): POCBNP CBG: No results for input(s): GLUCAP in the last 168 hours. D-Dimer No results for input(s): DDIMER in the last 72  hours. Hgb A1c No results for input(s): HGBA1C in the last 72 hours. Lipid Profile No results for input(s): CHOL, HDL, LDLCALC, TRIG, CHOLHDL, LDLDIRECT in the last 72 hours. Thyroid function studies No results for input(s): TSH, T4TOTAL, T3FREE, THYROIDAB in the last 72 hours.  Invalid input(s): FREET3 Anemia work up No results for input(s): VITAMINB12, FOLATE, FERRITIN, TIBC, IRON, RETICCTPCT in the last 72 hours. Urinalysis    Component Value Date/Time   COLORURINE STRAW (A) 02/10/2020 2122   APPEARANCEUR CLEAR 02/10/2020 2122   LABSPEC 1.005 02/10/2020 2122   PHURINE 5.0 02/10/2020 2122   GLUCOSEU NEGATIVE 02/10/2020 2122   HGBUR NEGATIVE 02/10/2020 2122   BILIRUBINUR NEGATIVE 02/10/2020 2122   BILIRUBINUR negative 01/20/2014 [place] 02/10/2020 2122   PROTEINUR NEGATIVE 02/10/2020 2122   UROBILINOGEN negative 01/20/2014 1032   NITRITE NEGATIVE 02/10/2020 2122   LEUKOCYTESUR NEGATIVE 02/10/2020 2122   Sepsis Labs Invalid input(s): PROCALCITONIN,  WBC,  LACTICIDVEN Microbiology Recent Results (from the past 240 hour(s))  SARS CORONAVIRUS 2 (TAT 6-24 HRS) Nasopharyngeal Nasopharyngeal Swab     Status: None   Collection Time: 02/10/20  2:47 PM   Specimen: Nasopharyngeal Swab  Result Value Ref Range Status   SARS Coronavirus 2 NEGATIVE NEGATIVE Final    Comment: (NOTE) SARS-CoV-2 target nucleic acids are NOT DETECTED. The SARS-CoV-2 RNA is generally detectable in upper and lower respiratory specimens during the acute phase of infection. Negative results do not preclude SARS-CoV-2 infection, do not rule out co-infections with other pathogens, and should not be used as the sole basis for treatment or other patient management decisions. Negative results must be combined with clinical observations, patient history, and epidemiological information. The expected result is Negative. Fact Sheet for Patients: SugarRoll.be Fact  Sheet for Healthcare Providers: https://www.woods-mathews.com/ This test is not yet approved or cleared by the [place] FDA and  has been authorized for detection and/or diagnosis of SARS-CoV-2 by FDA under an Emergency Use Authorization (EUA). This EUA will remain  in effect (meaning this test can be used) for the duration of the COVID-19 declaration under Section 56 4(b)(1) of the Act, 21 U.S.C. section 360bbb-3(b)(1), unless the authorization is terminated or revoked sooner. Performed at [facility], [place] [address], [place], [place] [address]   Culture, blood (Routine X 2) w Reflex to ID Panel     Status: None   Collection Time: 02/10/20  9:07 PM   Specimen: BLOOD  Result Value Ref Range Status   Specimen Description BLOOD BLOOD LEFT ARM  Final   Special Requests   Final    BOTTLES DRAWN AEROBIC ONLY Blood Culture adequate volume   Culture   Final    NO GROWTH 5  DAYS Performed at [facility], [address] [place], [place] [address]    Report Status 02/15/2020 FINAL  Final  Respiratory Panel by RT PCR (Flu A&B, Covid) - Nasopharyngeal Swab     Status: None   Collection Time: 02/10/20  9:22 PM   Specimen: Nasopharyngeal Swab  Result Value Ref Range Status   SARS Coronavirus 2 by RT PCR NEGATIVE NEGATIVE Final    Comment: (NOTE) SARS-CoV-2 target nucleic acids are NOT DETECTED. The SARS-CoV-2 RNA is generally detectable in upper respiratoy specimens during the acute phase of infection. The lowest concentration of SARS-CoV-2 viral copies this assay can detect is 131 copies/mL. A negative result does not preclude SARS-Cov-2 infection and should not be used as the sole basis for treatment or other patient management decisions. A negative result may occur with  improper specimen collection/handling, submission of specimen other than nasopharyngeal swab, presence of viral mutation(s) within the areas targeted by this assay, and inadequate number of viral  copies (<131 copies/mL). A negative result must be combined with clinical observations, patient history, and epidemiological information. The expected result is Negative. Fact Sheet for Patients:  PinkCheek.be Fact Sheet for Healthcare Providers:  GravelBags.it This test is not yet ap proved or cleared by the [place] FDA and  has been authorized for detection and/or diagnosis of SARS-CoV-2 by FDA under an Emergency Use Authorization (EUA). This EUA will remain  in effect (meaning this test can be used) for the duration of the COVID-19 declaration under Section 564(b)(1) of the Act, 21 U.S.C. section 360bbb-3(b)(1), unless the authorization is terminated or revoked sooner.    Influenza A by PCR NEGATIVE NEGATIVE Final   Influenza B by PCR NEGATIVE NEGATIVE Final    Comment: (NOTE) The Xpert Xpress SARS-CoV-2/FLU/RSV assay is intended as an aid in  the diagnosis of influenza from Nasopharyngeal swab specimens and  should not be used as a sole basis for treatment. Nasal washings and  aspirates are unacceptable for Xpert Xpress SARS-CoV-2/FLU/RSV  testing. Fact Sheet for Patients: PinkCheek.be Fact Sheet for Healthcare Providers: GravelBags.it This test is not yet approved or cleared by the [place] FDA and  has been authorized for detection and/or diagnosis of SARS-CoV-2 by  FDA under an Emergency Use Authorization (EUA). This EUA will remain  in effect (meaning this test can be used) for the duration of the  Covid-19 declaration under Section 564(b)(1) of the Act, 21  U.S.C. section 360bbb-3(b)(1), unless the authorization is  terminated or revoked. Performed at [facility], [address] [place], [place] [address]   Culture, blood (Routine X 2) w Reflex to ID Panel     Status: None   Collection Time: 02/10/20  9:32 PM   Specimen: BLOOD RIGHT FOREARM   Result Value Ref Range Status   Specimen Description BLOOD RIGHT FOREARM  Final   Special Requests   Final    BOTTLES DRAWN AEROBIC AND ANAEROBIC Blood Culture adequate volume   Culture   Final    NO GROWTH 5 DAYS Performed at [facility], [address] [place], [place] [address]    Report Status 02/15/2020 FINAL  Final  MRSA PCR Screening     Status: None   Collection Time: 02/10/20 10:35 PM   Specimen: Nasal Mucosa; Nasopharyngeal  Result Value Ref Range Status   MRSA by PCR NEGATIVE NEGATIVE Final    Comment:        The GeneXpert MRSA Assay (FDA approved for NASAL specimens only), is one component  of a comprehensive MRSA colonization surveillance program. It is not intended to diagnose MRSA infection nor to guide or monitor treatment for MRSA infections. Performed at [facility], [address] [place], [place] [address]     Time coordinating discharge: 34 minutes   SIGNED:  Irwin Brakeman, MD  [facility] 02/15/2020, 10:50 AM How to contact the [facility] Attending or Consulting provider [place] or covering provider during after hours [place], for this patient?  1. Check the care team in [facility] and look for a) attending/consulting [facility] provider listed and b) the [facility] team listed 2. Log into [email or web address] and use [place]'s universal password to access. If you do not have the password, please contact the hospital operator. 3. Locate the [facility] provider you are looking for under [facility] and page to a number that you can be directly reached. 4. If you still have difficulty reaching the provider, please page the [facility] (Director on Call) for the Hospitalists listed on amion for assistance.

## 2020-02-15 NOTE — Discharge Instructions (Signed)
IMPORTANT INFORMATION: PAY CLOSE ATTENTION   PHYSICIAN DISCHARGE INSTRUCTIONS  Follow with Primary care provider  Boyd, William S, PA  and other consultants as instructed by your Hospitalist Physician  SEEK MEDICAL CARE OR RETURN TO EMERGENCY ROOM IF SYMPTOMS COME BACK, WORSEN OR NEW PROBLEM DEVELOPS   Please note: You were cared for by a hospitalist during your hospital stay. Every effort will be made to forward records to your primary care provider.  You can request that your primary care provider send for your hospital records if they have not received them.  Once you are discharged, your primary care physician will handle any further medical issues. Please note that NO REFILLS for any discharge medications will be authorized once you are discharged, as it is imperative that you return to your primary care physician (or establish a relationship with a primary care physician if you do not have one) for your post hospital discharge needs so that they can reassess your need for medications and monitor your lab values.  Please get a complete blood count and chemistry panel checked by your Primary MD at your next visit, and again as instructed by your Primary MD.  Get Medicines reviewed and adjusted: Please take all your medications with you for your next visit with your Primary MD  Laboratory/radiological data: Please request your Primary MD to go over all hospital tests and procedure/radiological results at the follow up, please ask your primary care provider to get all Hospital records sent to his/her office.  In some cases, they will be blood work, cultures and biopsy results pending at the time of your discharge. Please request that your primary care provider follow up on these results.  If you are diabetic, please bring your blood sugar readings with you to your follow up appointment with primary care.    Please call and make your follow up appointments as soon as possible.    Also Note  the following: If you experience worsening of your admission symptoms, develop shortness of breath, life threatening emergency, suicidal or homicidal thoughts you must seek medical attention immediately by calling 911 or calling your MD immediately  if symptoms less severe.  You must read complete instructions/literature along with all the possible adverse reactions/side effects for all the Medicines you take and that have been prescribed to you. Take any new Medicines after you have completely understood and accpet all the possible adverse reactions/side effects.   Do not drive when taking Pain medications or sleeping medications (Benzodiazepines)  Do not take more than prescribed Pain, Sleep and Anxiety Medications. It is not advisable to combine anxiety,sleep and pain medications without talking with your primary care practitioner  Special Instructions: If you have smoked or chewed Tobacco  in the last 2 yrs please stop smoking, stop any regular Alcohol  and or any Recreational drug use.  Wear Seat belts while driving.  Do not drive if taking any narcotic, mind altering or controlled substances or recreational drugs or alcohol.       

## 2020-02-15 NOTE — Care Plan (Signed)
SATURATION QUALIFICATIONS: (This note is used to comply with regulatory documentation for home oxygen)  Patient Saturations on Room Air at Rest = 99%  Patient Saturations on Room Air while Ambulating = 80%  Patient Saturations on 2 Liters of oxygen while Ambulating = 100%  Please briefly explain why patient needs home oxygen: Pt unable to ambulate and stay above 90%, pt dropped to 80% with ambulation and required 2 Liter to recover back to 98% post ambulation

## 2020-02-19 DIAGNOSIS — J449 Chronic obstructive pulmonary disease, unspecified: Secondary | ICD-10-CM | POA: Diagnosis not present

## 2020-02-19 DIAGNOSIS — I509 Heart failure, unspecified: Secondary | ICD-10-CM | POA: Diagnosis not present

## 2020-02-20 DIAGNOSIS — J189 Pneumonia, unspecified organism: Secondary | ICD-10-CM | POA: Diagnosis not present

## 2020-02-20 DIAGNOSIS — N1832 Chronic kidney disease, stage 3b: Secondary | ICD-10-CM | POA: Diagnosis not present

## 2020-02-20 DIAGNOSIS — I5033 Acute on chronic diastolic (congestive) heart failure: Secondary | ICD-10-CM | POA: Diagnosis not present

## 2020-02-20 DIAGNOSIS — M1030 Gout due to renal impairment, unspecified site: Secondary | ICD-10-CM | POA: Diagnosis not present

## 2020-02-20 DIAGNOSIS — J441 Chronic obstructive pulmonary disease with (acute) exacerbation: Secondary | ICD-10-CM | POA: Diagnosis not present

## 2020-02-20 DIAGNOSIS — J440 Chronic obstructive pulmonary disease with acute lower respiratory infection: Secondary | ICD-10-CM | POA: Diagnosis not present

## 2020-02-20 DIAGNOSIS — J9621 Acute and chronic respiratory failure with hypoxia: Secondary | ICD-10-CM | POA: Diagnosis not present

## 2020-02-20 DIAGNOSIS — I130 Hypertensive heart and chronic kidney disease with heart failure and stage 1 through stage 4 chronic kidney disease, or unspecified chronic kidney disease: Secondary | ICD-10-CM | POA: Diagnosis not present

## 2020-02-20 DIAGNOSIS — I0981 Rheumatic heart failure: Secondary | ICD-10-CM | POA: Diagnosis not present

## 2020-02-20 DIAGNOSIS — J9622 Acute and chronic respiratory failure with hypercapnia: Secondary | ICD-10-CM | POA: Diagnosis not present

## 2020-02-25 DIAGNOSIS — J9621 Acute and chronic respiratory failure with hypoxia: Secondary | ICD-10-CM | POA: Diagnosis not present

## 2020-02-25 DIAGNOSIS — N1832 Chronic kidney disease, stage 3b: Secondary | ICD-10-CM | POA: Diagnosis not present

## 2020-02-25 DIAGNOSIS — I130 Hypertensive heart and chronic kidney disease with heart failure and stage 1 through stage 4 chronic kidney disease, or unspecified chronic kidney disease: Secondary | ICD-10-CM | POA: Diagnosis not present

## 2020-02-25 DIAGNOSIS — J9622 Acute and chronic respiratory failure with hypercapnia: Secondary | ICD-10-CM | POA: Diagnosis not present

## 2020-02-25 DIAGNOSIS — J440 Chronic obstructive pulmonary disease with acute lower respiratory infection: Secondary | ICD-10-CM | POA: Diagnosis not present

## 2020-02-25 DIAGNOSIS — J441 Chronic obstructive pulmonary disease with (acute) exacerbation: Secondary | ICD-10-CM | POA: Diagnosis not present

## 2020-02-25 DIAGNOSIS — J189 Pneumonia, unspecified organism: Secondary | ICD-10-CM | POA: Diagnosis not present

## 2020-02-25 DIAGNOSIS — M1030 Gout due to renal impairment, unspecified site: Secondary | ICD-10-CM | POA: Diagnosis not present

## 2020-02-25 DIAGNOSIS — I5033 Acute on chronic diastolic (congestive) heart failure: Secondary | ICD-10-CM | POA: Diagnosis not present

## 2020-02-25 DIAGNOSIS — I0981 Rheumatic heart failure: Secondary | ICD-10-CM | POA: Diagnosis not present

## 2020-02-27 DIAGNOSIS — I5033 Acute on chronic diastolic (congestive) heart failure: Secondary | ICD-10-CM | POA: Diagnosis not present

## 2020-02-27 DIAGNOSIS — I0981 Rheumatic heart failure: Secondary | ICD-10-CM | POA: Diagnosis not present

## 2020-02-27 DIAGNOSIS — N1832 Chronic kidney disease, stage 3b: Secondary | ICD-10-CM | POA: Diagnosis not present

## 2020-02-27 DIAGNOSIS — I130 Hypertensive heart and chronic kidney disease with heart failure and stage 1 through stage 4 chronic kidney disease, or unspecified chronic kidney disease: Secondary | ICD-10-CM | POA: Diagnosis not present

## 2020-02-27 DIAGNOSIS — J9621 Acute and chronic respiratory failure with hypoxia: Secondary | ICD-10-CM | POA: Diagnosis not present

## 2020-02-27 DIAGNOSIS — J9622 Acute and chronic respiratory failure with hypercapnia: Secondary | ICD-10-CM | POA: Diagnosis not present

## 2020-02-27 DIAGNOSIS — M1030 Gout due to renal impairment, unspecified site: Secondary | ICD-10-CM | POA: Diagnosis not present

## 2020-02-27 DIAGNOSIS — J441 Chronic obstructive pulmonary disease with (acute) exacerbation: Secondary | ICD-10-CM | POA: Diagnosis not present

## 2020-02-27 DIAGNOSIS — J440 Chronic obstructive pulmonary disease with acute lower respiratory infection: Secondary | ICD-10-CM | POA: Diagnosis not present

## 2020-02-27 DIAGNOSIS — J189 Pneumonia, unspecified organism: Secondary | ICD-10-CM | POA: Diagnosis not present

## 2020-02-28 DIAGNOSIS — E119 Type 2 diabetes mellitus without complications: Secondary | ICD-10-CM | POA: Diagnosis not present

## 2020-02-28 DIAGNOSIS — D649 Anemia, unspecified: Secondary | ICD-10-CM | POA: Diagnosis not present

## 2020-02-28 DIAGNOSIS — Z0100 Encounter for examination of eyes and vision without abnormal findings: Secondary | ICD-10-CM | POA: Diagnosis not present

## 2020-03-02 DIAGNOSIS — J189 Pneumonia, unspecified organism: Secondary | ICD-10-CM | POA: Diagnosis not present

## 2020-03-02 DIAGNOSIS — J440 Chronic obstructive pulmonary disease with acute lower respiratory infection: Secondary | ICD-10-CM | POA: Diagnosis not present

## 2020-03-02 DIAGNOSIS — I130 Hypertensive heart and chronic kidney disease with heart failure and stage 1 through stage 4 chronic kidney disease, or unspecified chronic kidney disease: Secondary | ICD-10-CM | POA: Diagnosis not present

## 2020-03-02 DIAGNOSIS — I5033 Acute on chronic diastolic (congestive) heart failure: Secondary | ICD-10-CM | POA: Diagnosis not present

## 2020-03-02 DIAGNOSIS — J9621 Acute and chronic respiratory failure with hypoxia: Secondary | ICD-10-CM | POA: Diagnosis not present

## 2020-03-02 DIAGNOSIS — I0981 Rheumatic heart failure: Secondary | ICD-10-CM | POA: Diagnosis not present

## 2020-03-02 DIAGNOSIS — J441 Chronic obstructive pulmonary disease with (acute) exacerbation: Secondary | ICD-10-CM | POA: Diagnosis not present

## 2020-03-02 DIAGNOSIS — M1030 Gout due to renal impairment, unspecified site: Secondary | ICD-10-CM | POA: Diagnosis not present

## 2020-03-02 DIAGNOSIS — J9622 Acute and chronic respiratory failure with hypercapnia: Secondary | ICD-10-CM | POA: Diagnosis not present

## 2020-03-02 DIAGNOSIS — N1832 Chronic kidney disease, stage 3b: Secondary | ICD-10-CM | POA: Diagnosis not present

## 2020-03-03 DIAGNOSIS — J189 Pneumonia, unspecified organism: Secondary | ICD-10-CM | POA: Diagnosis not present

## 2020-03-03 DIAGNOSIS — J441 Chronic obstructive pulmonary disease with (acute) exacerbation: Secondary | ICD-10-CM | POA: Diagnosis not present

## 2020-03-03 DIAGNOSIS — I5033 Acute on chronic diastolic (congestive) heart failure: Secondary | ICD-10-CM | POA: Diagnosis not present

## 2020-03-03 DIAGNOSIS — I130 Hypertensive heart and chronic kidney disease with heart failure and stage 1 through stage 4 chronic kidney disease, or unspecified chronic kidney disease: Secondary | ICD-10-CM | POA: Diagnosis not present

## 2020-03-03 DIAGNOSIS — J9621 Acute and chronic respiratory failure with hypoxia: Secondary | ICD-10-CM | POA: Diagnosis not present

## 2020-03-03 DIAGNOSIS — J9622 Acute and chronic respiratory failure with hypercapnia: Secondary | ICD-10-CM | POA: Diagnosis not present

## 2020-03-03 DIAGNOSIS — M1030 Gout due to renal impairment, unspecified site: Secondary | ICD-10-CM | POA: Diagnosis not present

## 2020-03-03 DIAGNOSIS — I0981 Rheumatic heart failure: Secondary | ICD-10-CM | POA: Diagnosis not present

## 2020-03-03 DIAGNOSIS — J440 Chronic obstructive pulmonary disease with acute lower respiratory infection: Secondary | ICD-10-CM | POA: Diagnosis not present

## 2020-03-03 DIAGNOSIS — N1832 Chronic kidney disease, stage 3b: Secondary | ICD-10-CM | POA: Diagnosis not present

## 2020-03-05 DIAGNOSIS — J9622 Acute and chronic respiratory failure with hypercapnia: Secondary | ICD-10-CM | POA: Diagnosis not present

## 2020-03-05 DIAGNOSIS — I5033 Acute on chronic diastolic (congestive) heart failure: Secondary | ICD-10-CM | POA: Diagnosis not present

## 2020-03-05 DIAGNOSIS — J189 Pneumonia, unspecified organism: Secondary | ICD-10-CM | POA: Diagnosis not present

## 2020-03-05 DIAGNOSIS — I130 Hypertensive heart and chronic kidney disease with heart failure and stage 1 through stage 4 chronic kidney disease, or unspecified chronic kidney disease: Secondary | ICD-10-CM | POA: Diagnosis not present

## 2020-03-05 DIAGNOSIS — I0981 Rheumatic heart failure: Secondary | ICD-10-CM | POA: Diagnosis not present

## 2020-03-05 DIAGNOSIS — J9621 Acute and chronic respiratory failure with hypoxia: Secondary | ICD-10-CM | POA: Diagnosis not present

## 2020-03-05 DIAGNOSIS — J441 Chronic obstructive pulmonary disease with (acute) exacerbation: Secondary | ICD-10-CM | POA: Diagnosis not present

## 2020-03-05 DIAGNOSIS — M1030 Gout due to renal impairment, unspecified site: Secondary | ICD-10-CM | POA: Diagnosis not present

## 2020-03-05 DIAGNOSIS — N1832 Chronic kidney disease, stage 3b: Secondary | ICD-10-CM | POA: Diagnosis not present

## 2020-03-05 DIAGNOSIS — J440 Chronic obstructive pulmonary disease with acute lower respiratory infection: Secondary | ICD-10-CM | POA: Diagnosis not present

## 2020-03-06 DIAGNOSIS — J9621 Acute and chronic respiratory failure with hypoxia: Secondary | ICD-10-CM | POA: Diagnosis not present

## 2020-03-06 DIAGNOSIS — M1030 Gout due to renal impairment, unspecified site: Secondary | ICD-10-CM | POA: Diagnosis not present

## 2020-03-06 DIAGNOSIS — J9622 Acute and chronic respiratory failure with hypercapnia: Secondary | ICD-10-CM | POA: Diagnosis not present

## 2020-03-06 DIAGNOSIS — N1832 Chronic kidney disease, stage 3b: Secondary | ICD-10-CM | POA: Diagnosis not present

## 2020-03-06 DIAGNOSIS — I0981 Rheumatic heart failure: Secondary | ICD-10-CM | POA: Diagnosis not present

## 2020-03-06 DIAGNOSIS — J440 Chronic obstructive pulmonary disease with acute lower respiratory infection: Secondary | ICD-10-CM | POA: Diagnosis not present

## 2020-03-06 DIAGNOSIS — I130 Hypertensive heart and chronic kidney disease with heart failure and stage 1 through stage 4 chronic kidney disease, or unspecified chronic kidney disease: Secondary | ICD-10-CM | POA: Diagnosis not present

## 2020-03-06 DIAGNOSIS — J441 Chronic obstructive pulmonary disease with (acute) exacerbation: Secondary | ICD-10-CM | POA: Diagnosis not present

## 2020-03-06 DIAGNOSIS — I5033 Acute on chronic diastolic (congestive) heart failure: Secondary | ICD-10-CM | POA: Diagnosis not present

## 2020-03-06 DIAGNOSIS — J189 Pneumonia, unspecified organism: Secondary | ICD-10-CM | POA: Diagnosis not present

## 2020-03-10 DIAGNOSIS — J189 Pneumonia, unspecified organism: Secondary | ICD-10-CM | POA: Diagnosis not present

## 2020-03-10 DIAGNOSIS — I130 Hypertensive heart and chronic kidney disease with heart failure and stage 1 through stage 4 chronic kidney disease, or unspecified chronic kidney disease: Secondary | ICD-10-CM | POA: Diagnosis not present

## 2020-03-10 DIAGNOSIS — I5033 Acute on chronic diastolic (congestive) heart failure: Secondary | ICD-10-CM | POA: Diagnosis not present

## 2020-03-10 DIAGNOSIS — J440 Chronic obstructive pulmonary disease with acute lower respiratory infection: Secondary | ICD-10-CM | POA: Diagnosis not present

## 2020-03-10 DIAGNOSIS — J9622 Acute and chronic respiratory failure with hypercapnia: Secondary | ICD-10-CM | POA: Diagnosis not present

## 2020-03-10 DIAGNOSIS — J441 Chronic obstructive pulmonary disease with (acute) exacerbation: Secondary | ICD-10-CM | POA: Diagnosis not present

## 2020-03-10 DIAGNOSIS — J9621 Acute and chronic respiratory failure with hypoxia: Secondary | ICD-10-CM | POA: Diagnosis not present

## 2020-03-10 DIAGNOSIS — I0981 Rheumatic heart failure: Secondary | ICD-10-CM | POA: Diagnosis not present

## 2020-03-12 DIAGNOSIS — E782 Mixed hyperlipidemia: Secondary | ICD-10-CM | POA: Diagnosis not present

## 2020-03-12 DIAGNOSIS — J449 Chronic obstructive pulmonary disease, unspecified: Secondary | ICD-10-CM | POA: Diagnosis not present

## 2020-03-12 DIAGNOSIS — K219 Gastro-esophageal reflux disease without esophagitis: Secondary | ICD-10-CM | POA: Diagnosis not present

## 2020-03-12 DIAGNOSIS — I10 Essential (primary) hypertension: Secondary | ICD-10-CM | POA: Diagnosis not present

## 2020-03-12 DIAGNOSIS — Z6834 Body mass index (BMI) 34.0-34.9, adult: Secondary | ICD-10-CM | POA: Diagnosis not present

## 2020-03-13 DIAGNOSIS — I130 Hypertensive heart and chronic kidney disease with heart failure and stage 1 through stage 4 chronic kidney disease, or unspecified chronic kidney disease: Secondary | ICD-10-CM | POA: Diagnosis not present

## 2020-03-13 DIAGNOSIS — M1030 Gout due to renal impairment, unspecified site: Secondary | ICD-10-CM | POA: Diagnosis not present

## 2020-03-13 DIAGNOSIS — J189 Pneumonia, unspecified organism: Secondary | ICD-10-CM | POA: Diagnosis not present

## 2020-03-13 DIAGNOSIS — J9621 Acute and chronic respiratory failure with hypoxia: Secondary | ICD-10-CM | POA: Diagnosis not present

## 2020-03-13 DIAGNOSIS — J440 Chronic obstructive pulmonary disease with acute lower respiratory infection: Secondary | ICD-10-CM | POA: Diagnosis not present

## 2020-03-13 DIAGNOSIS — I0981 Rheumatic heart failure: Secondary | ICD-10-CM | POA: Diagnosis not present

## 2020-03-13 DIAGNOSIS — N1832 Chronic kidney disease, stage 3b: Secondary | ICD-10-CM | POA: Diagnosis not present

## 2020-03-13 DIAGNOSIS — J441 Chronic obstructive pulmonary disease with (acute) exacerbation: Secondary | ICD-10-CM | POA: Diagnosis not present

## 2020-03-13 DIAGNOSIS — I5033 Acute on chronic diastolic (congestive) heart failure: Secondary | ICD-10-CM | POA: Diagnosis not present

## 2020-03-13 DIAGNOSIS — J9622 Acute and chronic respiratory failure with hypercapnia: Secondary | ICD-10-CM | POA: Diagnosis not present

## 2020-03-16 DIAGNOSIS — I5033 Acute on chronic diastolic (congestive) heart failure: Secondary | ICD-10-CM | POA: Diagnosis not present

## 2020-03-16 DIAGNOSIS — M1030 Gout due to renal impairment, unspecified site: Secondary | ICD-10-CM | POA: Diagnosis not present

## 2020-03-16 DIAGNOSIS — J9621 Acute and chronic respiratory failure with hypoxia: Secondary | ICD-10-CM | POA: Diagnosis not present

## 2020-03-16 DIAGNOSIS — Z6833 Body mass index (BMI) 33.0-33.9, adult: Secondary | ICD-10-CM | POA: Diagnosis not present

## 2020-03-16 DIAGNOSIS — I0981 Rheumatic heart failure: Secondary | ICD-10-CM | POA: Diagnosis not present

## 2020-03-16 DIAGNOSIS — J441 Chronic obstructive pulmonary disease with (acute) exacerbation: Secondary | ICD-10-CM | POA: Diagnosis not present

## 2020-03-16 DIAGNOSIS — J189 Pneumonia, unspecified organism: Secondary | ICD-10-CM | POA: Diagnosis not present

## 2020-03-16 DIAGNOSIS — J440 Chronic obstructive pulmonary disease with acute lower respiratory infection: Secondary | ICD-10-CM | POA: Diagnosis not present

## 2020-03-16 DIAGNOSIS — N1832 Chronic kidney disease, stage 3b: Secondary | ICD-10-CM | POA: Diagnosis not present

## 2020-03-16 DIAGNOSIS — J9622 Acute and chronic respiratory failure with hypercapnia: Secondary | ICD-10-CM | POA: Diagnosis not present

## 2020-03-16 DIAGNOSIS — I130 Hypertensive heart and chronic kidney disease with heart failure and stage 1 through stage 4 chronic kidney disease, or unspecified chronic kidney disease: Secondary | ICD-10-CM | POA: Diagnosis not present

## 2020-03-18 DIAGNOSIS — J9621 Acute and chronic respiratory failure with hypoxia: Secondary | ICD-10-CM | POA: Diagnosis not present

## 2020-03-18 DIAGNOSIS — N1832 Chronic kidney disease, stage 3b: Secondary | ICD-10-CM | POA: Diagnosis not present

## 2020-03-18 DIAGNOSIS — J441 Chronic obstructive pulmonary disease with (acute) exacerbation: Secondary | ICD-10-CM | POA: Diagnosis not present

## 2020-03-18 DIAGNOSIS — I5033 Acute on chronic diastolic (congestive) heart failure: Secondary | ICD-10-CM | POA: Diagnosis not present

## 2020-03-18 DIAGNOSIS — I0981 Rheumatic heart failure: Secondary | ICD-10-CM | POA: Diagnosis not present

## 2020-03-18 DIAGNOSIS — J9622 Acute and chronic respiratory failure with hypercapnia: Secondary | ICD-10-CM | POA: Diagnosis not present

## 2020-03-18 DIAGNOSIS — J189 Pneumonia, unspecified organism: Secondary | ICD-10-CM | POA: Diagnosis not present

## 2020-03-18 DIAGNOSIS — J440 Chronic obstructive pulmonary disease with acute lower respiratory infection: Secondary | ICD-10-CM | POA: Diagnosis not present

## 2020-03-18 DIAGNOSIS — M1030 Gout due to renal impairment, unspecified site: Secondary | ICD-10-CM | POA: Diagnosis not present

## 2020-03-18 DIAGNOSIS — I130 Hypertensive heart and chronic kidney disease with heart failure and stage 1 through stage 4 chronic kidney disease, or unspecified chronic kidney disease: Secondary | ICD-10-CM | POA: Diagnosis not present

## 2020-03-19 DIAGNOSIS — E119 Type 2 diabetes mellitus without complications: Secondary | ICD-10-CM | POA: Diagnosis not present

## 2020-03-19 DIAGNOSIS — I10 Essential (primary) hypertension: Secondary | ICD-10-CM | POA: Diagnosis not present

## 2020-03-19 DIAGNOSIS — K219 Gastro-esophageal reflux disease without esophagitis: Secondary | ICD-10-CM | POA: Diagnosis not present

## 2020-03-19 DIAGNOSIS — N289 Disorder of kidney and ureter, unspecified: Secondary | ICD-10-CM | POA: Diagnosis not present

## 2020-03-20 DIAGNOSIS — J449 Chronic obstructive pulmonary disease, unspecified: Secondary | ICD-10-CM | POA: Diagnosis not present

## 2020-03-20 DIAGNOSIS — I509 Heart failure, unspecified: Secondary | ICD-10-CM | POA: Diagnosis not present

## 2020-03-25 DIAGNOSIS — I130 Hypertensive heart and chronic kidney disease with heart failure and stage 1 through stage 4 chronic kidney disease, or unspecified chronic kidney disease: Secondary | ICD-10-CM | POA: Diagnosis not present

## 2020-03-25 DIAGNOSIS — J9622 Acute and chronic respiratory failure with hypercapnia: Secondary | ICD-10-CM | POA: Diagnosis not present

## 2020-03-25 DIAGNOSIS — I5033 Acute on chronic diastolic (congestive) heart failure: Secondary | ICD-10-CM | POA: Diagnosis not present

## 2020-03-25 DIAGNOSIS — I0981 Rheumatic heart failure: Secondary | ICD-10-CM | POA: Diagnosis not present

## 2020-03-25 DIAGNOSIS — M1030 Gout due to renal impairment, unspecified site: Secondary | ICD-10-CM | POA: Diagnosis not present

## 2020-03-25 DIAGNOSIS — N1832 Chronic kidney disease, stage 3b: Secondary | ICD-10-CM | POA: Diagnosis not present

## 2020-03-25 DIAGNOSIS — J440 Chronic obstructive pulmonary disease with acute lower respiratory infection: Secondary | ICD-10-CM | POA: Diagnosis not present

## 2020-03-25 DIAGNOSIS — J189 Pneumonia, unspecified organism: Secondary | ICD-10-CM | POA: Diagnosis not present

## 2020-03-25 DIAGNOSIS — J9621 Acute and chronic respiratory failure with hypoxia: Secondary | ICD-10-CM | POA: Diagnosis not present

## 2020-03-25 DIAGNOSIS — J441 Chronic obstructive pulmonary disease with (acute) exacerbation: Secondary | ICD-10-CM | POA: Diagnosis not present

## 2020-03-26 DIAGNOSIS — Z0000 Encounter for general adult medical examination without abnormal findings: Secondary | ICD-10-CM | POA: Diagnosis not present

## 2020-03-26 DIAGNOSIS — R94120 Abnormal auditory function study: Secondary | ICD-10-CM | POA: Diagnosis not present

## 2020-03-26 DIAGNOSIS — R799 Abnormal finding of blood chemistry, unspecified: Secondary | ICD-10-CM | POA: Diagnosis not present

## 2020-03-30 DIAGNOSIS — E1122 Type 2 diabetes mellitus with diabetic chronic kidney disease: Secondary | ICD-10-CM | POA: Diagnosis not present

## 2020-03-30 DIAGNOSIS — I129 Hypertensive chronic kidney disease with stage 1 through stage 4 chronic kidney disease, or unspecified chronic kidney disease: Secondary | ICD-10-CM | POA: Diagnosis not present

## 2020-03-30 DIAGNOSIS — N184 Chronic kidney disease, stage 4 (severe): Secondary | ICD-10-CM | POA: Diagnosis not present

## 2020-03-30 DIAGNOSIS — E7849 Other hyperlipidemia: Secondary | ICD-10-CM | POA: Diagnosis not present

## 2020-04-01 DIAGNOSIS — M1030 Gout due to renal impairment, unspecified site: Secondary | ICD-10-CM | POA: Diagnosis not present

## 2020-04-01 DIAGNOSIS — J441 Chronic obstructive pulmonary disease with (acute) exacerbation: Secondary | ICD-10-CM | POA: Diagnosis not present

## 2020-04-01 DIAGNOSIS — J9621 Acute and chronic respiratory failure with hypoxia: Secondary | ICD-10-CM | POA: Diagnosis not present

## 2020-04-01 DIAGNOSIS — I130 Hypertensive heart and chronic kidney disease with heart failure and stage 1 through stage 4 chronic kidney disease, or unspecified chronic kidney disease: Secondary | ICD-10-CM | POA: Diagnosis not present

## 2020-04-01 DIAGNOSIS — I5033 Acute on chronic diastolic (congestive) heart failure: Secondary | ICD-10-CM | POA: Diagnosis not present

## 2020-04-01 DIAGNOSIS — J440 Chronic obstructive pulmonary disease with acute lower respiratory infection: Secondary | ICD-10-CM | POA: Diagnosis not present

## 2020-04-01 DIAGNOSIS — J9622 Acute and chronic respiratory failure with hypercapnia: Secondary | ICD-10-CM | POA: Diagnosis not present

## 2020-04-01 DIAGNOSIS — I0981 Rheumatic heart failure: Secondary | ICD-10-CM | POA: Diagnosis not present

## 2020-04-01 DIAGNOSIS — N1832 Chronic kidney disease, stage 3b: Secondary | ICD-10-CM | POA: Diagnosis not present

## 2020-04-01 DIAGNOSIS — J189 Pneumonia, unspecified organism: Secondary | ICD-10-CM | POA: Diagnosis not present

## 2020-04-03 ENCOUNTER — Ambulatory Visit (HOSPITAL_COMMUNITY)
Admission: RE | Admit: 2020-04-03 | Discharge: 2020-04-03 | Disposition: A | Discharge status: Home / Self Care | Payer: Medicare HMO | Source: Ambulatory Visit | Attending: Internal Medicine | Admitting: Internal Medicine

## 2020-04-03 ENCOUNTER — Other Ambulatory Visit: Payer: Self-pay

## 2020-04-03 ENCOUNTER — Ambulatory Visit: Payer: Medicare HMO | Admitting: Internal Medicine

## 2020-04-03 ENCOUNTER — Encounter: Payer: Self-pay | Admitting: Internal Medicine

## 2020-04-03 DIAGNOSIS — I5032 Chronic diastolic (congestive) heart failure: Secondary | ICD-10-CM | POA: Diagnosis not present

## 2020-04-03 DIAGNOSIS — I10 Essential (primary) hypertension: Secondary | ICD-10-CM | POA: Diagnosis not present

## 2020-04-03 DIAGNOSIS — E211 Secondary hyperparathyroidism, not elsewhere classified: Secondary | ICD-10-CM | POA: Diagnosis not present

## 2020-04-03 DIAGNOSIS — N189 Chronic kidney disease, unspecified: Secondary | ICD-10-CM | POA: Diagnosis not present

## 2020-04-03 DIAGNOSIS — N182 Chronic kidney disease, stage 2 (mild): Secondary | ICD-10-CM | POA: Diagnosis not present

## 2020-04-03 DIAGNOSIS — D631 Anemia in chronic kidney disease: Secondary | ICD-10-CM | POA: Diagnosis not present

## 2020-04-03 DIAGNOSIS — R0609 Other forms of dyspnea: Secondary | ICD-10-CM

## 2020-04-03 DIAGNOSIS — J9611 Chronic respiratory failure with hypoxia: Secondary | ICD-10-CM

## 2020-04-03 DIAGNOSIS — R0600 Dyspnea, unspecified: Secondary | ICD-10-CM | POA: Insufficient documentation

## 2020-04-03 DIAGNOSIS — E874 Mixed disorder of acid-base balance: Secondary | ICD-10-CM | POA: Diagnosis not present

## 2020-04-03 DIAGNOSIS — E871 Hypo-osmolality and hyponatremia: Secondary | ICD-10-CM | POA: Diagnosis not present

## 2020-04-03 DIAGNOSIS — R0602 Shortness of breath: Secondary | ICD-10-CM | POA: Diagnosis not present

## 2020-04-03 DIAGNOSIS — E1122 Type 2 diabetes mellitus with diabetic chronic kidney disease: Secondary | ICD-10-CM | POA: Diagnosis not present

## 2020-04-03 MED ORDER — LOSARTAN POTASSIUM 50 MG PO TABS: ORAL_TABLET | ORAL | 2 refills | Status: DC

## 2020-04-03 NOTE — Progress Notes (Signed)
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  Reports approximately 1 week of progressive shortness of breath associated with new onset nonproductive cough.He denies any associated orthopnea,PND, or recent worsening of his chronic bilateral lower extremity edema. Denies any associated hemoptysis, calf tenderness, lower extremity erythema.Denies any recent trauma or travel. Denies any associated wheezing.Denies any recent chest pain, diaphoresis, palpitations, nausea, vomiting.Also denies any recent diarrhea, melena, or hematochezia. He acknowledges a history of chronic heart failure, and reports good compliance with his home Lasix, without any recent dose adjustments to this diuretic. He also conveys generalized weakness over the last 3 to 4 days, in the absence of any acute focal weakness.  Of note, most recent echocardiogram, which was performed in December 2020, showed mild LVH, LVEF 55 to 68%, grade 1 diastolic dysfunction, no evidence of focal wall motion abnormalities.Echo also showed moderately dilated left atrium and mild mitral regurgitation.  The patient acknowledges that he is a former smoker, having smoked 1 pack/day for 20 to 25 years, before completely quitting smoking in 1996 without any subsequent resumption thereof. He denies any known underlying chronic pulmonary  pathology, including no known underlying emphysema. Denies any baseline supplemental oxygen requirements.  Denies anyassociatedsubjective fever, chills, rigors, or generalized myalgias. Denies any recent headache, neck stiffness, rhinitis, rhinorrhea, sore throat, or rash. No known COVID-19 exposures. Denies dysuria, gross hematuria, or change in urinary urgency/frequency.  Acknowledges a history of chronic alcohol abuse in which she reports typical daily consumption of 1 pint of rum, with most recent alcohol consumption occurring on Saturday, 02/08/2020.He reports that he has no interest in discontinuation of his alcohol consumption at this time. Denies any history of alcohol withdrawal symptoms when he haspreviously abstained from alcohol consumption.  ED Course: Vital signs in the ED were notable for the following:Temperature max 98.0; heart rate 74-85; blood pressure154/97 - 164/94; respiratory rate 20-25; initial oxygen saturation to be 86% on room air, which is improved to 99 to 100% on 4 L nasal cannula.  Labs were notable for the following:CMP notable for the following: Sodium 135, potassium 3.9, bicarbonate 28, BUN 24, creatinine 2.47 relative to most recent prior value of 2.91 01/03/2020, S1 who 2, and liver enzymes were found to be within normal limits. BNP noted to be 537, with no prior BNP value available for point comparison. Initial high-sensitivity troponin I found to be 11, with subsequent value found to be trending down to 10. CBC notable for white blood cell count of 4300 with 59% neutrophils, hemoglobin 10.6.    Chest x-ray showed evidence of bilateral airspace opacities, potentially consistent with atypical pneumonia in the absence of any evidence of edema or pneumothorax. EKG showed sinus rhythm with PAC and ventricular rate of 75, with nonspecific T wave inversion in leads III and V1, of which to inversion in V1 was unchanged relative to most recent prior EKG  from 01/03/2020.This evening's EKG also showed no evidence of ST changes, including no evidence of ST elevation.  While in the ED, the following were administered:Azithromycin 500 mg IV x1, Rocephin 1 g IV x1, Lasix 40 mg IV x1, and albuterol inhaler x8 puffs.  Acute hypoxemic and hypercapnic respiratory failure secondary to COPD exacerbation -Blood cultures with no growth thus far -Continue Rocephin and azithromycin for presumed atypical pneumonia - IV steroids, scheduled Duoneb treatmentsseem to be helping.  -Urine strep pneumonia negative -Covid testing negative -Wean oxygen and Bipap as tolerated -Continue breathing treatments as needed -Following ABGs: no significant improvement so far.  -Pulmonary consultation requested.   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 Pulmonary Consult    Referred by Aggie Hacker, PA.  Pt c/o SOB "for a good while"- worse x 7-8 months. He states he gets winded walking to the mailbox and sometimes has to stop.  Dyspnea:  Mailbox maybe a 100 ft slt  Cough: none  Sleep: 30 degrees/  1 pillow  SABA use:  02  2lpm 24/7 not checking with ex  No obvious day to day or daytime variability or assoc excess/ purulent sputum or mucus plugs or hemoptysis or cp or chest tightness, subjective wheeze or overt sinus or hb symptoms.   Sleeping as above  without nocturnal  or early am exacerbation  of respiratory   c/o's or need for noct saba. Also denies any obvious fluctuation of symptoms with weather or environmental changes or other aggravating or alleviating factors except as outlined above   No unusual exposure hx or h/o childhood pna/ asthma or knowledge of premature birth.  Current Allergies, Complete Past Medical History, Past Surgical History, Family History, and Social History were reviewed in [employer] record.  ROS  The following are not active complaints unless bolded Hoarseness, sore throat, dysphagia, dental problems, itching, sneezing,  nasal congestion or discharge of excess mucus or purulent secretions, ear ache,   fever, chills, sweats, unintended wt loss or wt gain, classically pleuritic or exertional cp,  orthopnea pnd or arm/hand swelling  or leg swelling, presyncope, palpitations, abdominal pain, anorexia, nausea, vomiting, diarrhea  or change in bowel habits or change in bladder habits, change in stools or change in urine, dysuria, hematuria,  rash, arthralgias, visual complaints, headache, numbness, weakness or ataxia or problems with walking or coordination,  change in mood or  memory.           Past Medical History:  Diagnosis Date  . Borderline diabetic   . GERD (gastroesophageal reflux disease)   . Gout   . Gouty arthritis   . H/O small bowel obstruction   . History of back surgery   . Hyperlipidemia   . Hypertension   . PE (pulmonary embolism)     Outpatient Medications Prior to Visit  Medication Sig Dispense Refill  . allopurinol (ZYLOPRIM) 100 MG tablet Take 1 tablet (100 mg total) by mouth daily. 90 tablet 1  . amLODipine (NORVASC) 10 MG tablet Take 5 mg by mouth daily.     Marland Kitchen  atorvastatin (LIPITOR) 40 MG tablet Take 1 tablet (40 mg total) by mouth daily. 90 tablet 1  . fenofibrate (TRICOR) 145 MG tablet Take 145 mg by mouth daily.    . furosemide (LASIX) 40 MG tablet Take 40 mg by mouth.    Marland Kitchen ipratropium-albuterol (DUONEB) 0.5-2.5 (3)  MG/3ML SOLN Take 3 mLs by nebulization 3 (three) times daily. 360 mL 0  . losartan (COZAAR) 50 MG tablet Take 50 mg by mouth daily.     . metoprolol (LOPRESSOR) 50 MG tablet Take 1 tablet (50 mg total) by mouth 2 (two) times daily. 180 tablet 1  . Multiple Vitamin (MULTIVITAMIN WITH MINERALS) TABS tablet Take 1 tablet by mouth daily.    . pantoprazole (PROTONIX) 40 MG tablet Take 1 tablet (40 mg total) by mouth daily. 90 tablet 1  . thiamine 250 MG tablet Take 250 mg by mouth daily.    . folic acid (FOLVITE) 1 MG tablet Take 1 tablet (1 mg total) by mouth daily. 30 tablet 0  . predniSONE (DELTASONE) 20 MG tablet Take 3 PO QAM x3days, 2 PO QAM x3days, 1 PO QAM x3days 18 tablet 0      Objective:     BP (!) 170/92 (BP Location: Left Arm, Cuff Size: Normal)   Pulse 80   Temp 97.9 F (36.6 C) (Temporal)   Ht 5\' 9"  (1.753 m)   Wt 217 lb (98.4 kg)   SpO2 100% Comment: 2lpm cont o2  BMI 32.05 kg/m   SpO2: 100 %(2lpm cont o2) O2 Type: Continuous O2 O2 Flow Rate (L/min): 2 L/min     Obese bm very poor insight into dx/ meds - wife takes care of his meds and she is also easily confused with detatils   HEENT : pt wearing mask not removed for exam due to covid -19 concerns.    NECK :  without JVD/Nodes/TM/ nl carotid upstrokes bilaterally   LUNGS: no acc muscle use,  Nl contour chest which is clear to A and P bilaterally without cough on insp or exp maneuvers   CV:  RRR  no s3 or murmur or increase in P2, and  1+ ptting both LE but  "it's getting better than it was"    ABD:  soft and nontender with nl inspiratory excursion in the supine position. No bruits or organomegaly appreciated, bowel sounds nl  MS:  Nl gait/ ext warm without deformities, calf tenderness, cyanosis or clubbing No obvious joint restrictions   SKIN: warm and dry without lesions    NEURO:  alert, approp, nl sensorium with  no motor or cerebellar deficits apparent     CXR PA and Lateral:   04/03/2020 :    I  personally reviewed images and   impression as follows:   Cm decreased lung vol  ? slt eventration  ant potion of R HD       Assessment   DOE (dyspnea on exertion) Quit smoking 1996 - Spirometry   02/24/14  unside down bowl shaped f/v cure s ostruction in effort indp portion - Echo 10/29/20 1. Left ventricular ejection fraction, by visual estimation, is 55 to  60%. The left ventricle has normal function. There is mildly increased  left ventricular hypertrophy.  2. Left ventricular diastolic parameters are consistent with Grade I  diastolic dysfunction (impaired relaxation).  3. The left ventricle has no regional wall motion abnormalities.  4. Global right ventricle has normal systolic function.The right  ventricular size is normal. No increase in  right ventricular wall  thickness.  5. Left atrial size was moderately dilated.  6. Right atrial size was mildly dilated.  7. Mild mitral annular calcification.  8. The mitral valve is grossly normal. Mild mitral valve regurgitation.  9. The tricuspid valve is grossly normal.  10. The aortic valve is tricuspid. Aortic valve regurgitation is not  visualized.  11. The pulmonic valve was grossly normal. Pulmonic valve regurgitation is  trivial.  12. TR signal is inadequate for assessing pulmonary artery systolic  pressure.  13. The inferior vena cava is normal in size with greater than 50%  respiratory variability, suggesting right atrial pressure of 3 mmHg.  - placed on 02 02/15/20 d/c (see separate a/p)   Problem is clearly restrictive in pt with diastolic dysfunction and CRI so goal is to rx bp and monitor creatinine carefully  Can use duoneb prn as I don't think he has primary airways dz but could have cardiac asthma component  Advised: I spent extra time with pt today reviewing appropriate use of albuterol for prn use on exertion with the following points: 1) saba is for relief of sob that does not improve by walking a slower  pace or resting but rather if the pt does not improve after trying this first. 2) If the pt is convinced, as many are, that saba helps recover from activity faster then it's easy to tell if this is the case by re-challenging : ie stop, take the inhaler, then p 5 minutes try the exact same activity (intensity of workload) that just caused the symptoms and see if they are substantially diminished or not after saba 3) if there is an activity that reproducibly causes the symptoms, try the saba 15 min before the activity on alternate days   If in fact the saba really does help, then fine to continue to use it prn but advised may need to look closer at the maintenance regimen being used to achieve better control of airways disease with exertion.     CKD (chronic kidney disease) stage 2, GFR 60-89 ml/min Lab Results  Component Value Date   CREATININE 2.41 (H) 02/14/2020   CREATININE 2.34 (H) 02/13/2020   CREATININE 2.45 (H) 02/12/2020   CREATININE 1.66 (H) 05/07/2013   CREATININE 1.38 (H) 04/12/2013     will need to be monitored carefully on arb > will have him return w/in a week if not being followed actively by PCP for same problem but note he still looks to be vol overloaded at present and any changes in diuretic or bp meds will have to be carefully followed, especially given how difficult it was to communicate details of care today to him and his wife.   Essential hypertension Not well controlled > increase losaran to 50 mg bid and recheck bmet w/in a week and f/u with PCP either way.   Chronic respiratory failure with hypoxia (HCC) D/c on 02/15/20 on 2lpm 24/7   As of 04/03/2020  rec 2lpm sleeping/ sitting and advised: Make sure you check your oxygen saturations at highest level of activity to be sure it stays over 90% and adjust upward to maintain this level if needed but remember to turn it back to previous settings when you stop (to conserve your supply).     Each maintenance medication  was reviewed in detail including emphasizing most importantly the difference between maintenance and prns and under what circumstances the prns are to be triggered using an action plan format where appropriate.  Total time for H and P, chart review, counseling, teaching device use (duoneb)  and generating customized AVS unique to this office visit / charting = 40 min           Christinia Gully, MD 04/03/2020

## 2020-04-03 NOTE — Assessment & Plan Note (Signed)
Quit smoking 1996 - Spirometry   02/24/14  unside down bowl shaped f/v cure s ostruction in effort indp portion - Echo 10/29/20 1. Left ventricular ejection fraction, by visual estimation, is 55 to  60%. The left ventricle has normal function. There is mildly increased  left ventricular hypertrophy.  2. Left ventricular diastolic parameters are consistent with Grade I  diastolic dysfunction (impaired relaxation).  3. The left ventricle has no regional wall motion abnormalities.  4. Global right ventricle has normal systolic function.The right  ventricular size is normal. No increase in right ventricular wall  thickness.  5. Left atrial size was moderately dilated.  6. Right atrial size was mildly dilated.  7. Mild mitral annular calcification.  8. The mitral valve is grossly normal. Mild mitral valve regurgitation.  9. The tricuspid valve is grossly normal.  10. The aortic valve is tricuspid. Aortic valve regurgitation is not  visualized.  11. The pulmonic valve was grossly normal. Pulmonic valve regurgitation is  trivial.  12. TR signal is inadequate for assessing pulmonary artery systolic  pressure.  13. The inferior vena cava is normal in size with greater than 50%  respiratory variability, suggesting right atrial pressure of 3 mmHg.  - placed on 02 02/15/20 d/c (see separate a/p)   Problem is clearly restrictive in pt with diastolic dysfunction and CRI so goal is to rx bp and monitor creatinine carefully  Can use duoneb prn as I don't think he has primary airways dz but could have cardiac asthma component  Advised: I spent extra time with pt today reviewing appropriate use of albuterol for prn use on exertion with the following points: 1) saba is for relief of sob that does not improve by walking a slower pace or resting but rather if the pt does not improve after trying this first. 2) If the pt is convinced, as many are, that saba helps recover from activity faster then  it's easy to tell if this is the case by re-challenging : ie stop, take the inhaler, then p 5 minutes try the exact same activity (intensity of workload) that just caused the symptoms and see if they are substantially diminished or not after saba 3) if there is an activity that reproducibly causes the symptoms, try the saba 15 min before the activity on alternate days   If in fact the saba really does help, then fine to continue to use it prn but advised may need to look closer at the maintenance regimen being used to achieve better control of airways disease with exertion.

## 2020-04-03 NOTE — Patient Instructions (Addendum)
Make sure you check your oxygen saturations at highest level of activity to be sure it stays over 90% and adjust upward to maintain this level if needed but remember to turn it back to previous settings when you stop (to conserve your supply).   Losartan 50 mg one twice daily   Please remember to go to the  x-ray department  for your tests - we will call you with the results when they are available    For breathing difficulty take duoneb up to every 4 hours as needed    Follow up with Dr Halford Chessman in 3 weeks here in this office call sooner if needed

## 2020-04-03 NOTE — Assessment & Plan Note (Signed)
Not well controlled > increase losaran to 50 mg bid and recheck bmet w/in a week and f/u with PCP either way.

## 2020-04-03 NOTE — Assessment & Plan Note (Signed)
D/c on 02/15/20 on 2lpm 24/7   As of 04/03/2020  rec 2lpm sleeping/ sitting and advised: Make sure you check your oxygen saturations at highest level of activity to be sure it stays over 90% and adjust upward to maintain this level if needed but remember to turn it back to previous settings when you stop (to conserve your supply).           Each maintenance medication was reviewed in detail including emphasizing most importantly the difference between maintenance and prns and under what circumstances the prns are to be triggered using an action plan format where appropriate.  Total time for H and P, chart review, counseling, teaching device use (duoneb)  and generating customized AVS unique to this office visit / charting = 40 min

## 2020-04-03 NOTE — Assessment & Plan Note (Signed)
Lab Results  Component Value Date   CREATININE 2.41 (H) 02/14/2020   CREATININE 2.34 (H) 02/13/2020   CREATININE 2.45 (H) 02/12/2020   CREATININE 1.66 (H) 05/07/2013   CREATININE 1.38 (H) 04/12/2013     will need to be monitored carefull on arb > will have him return w/in a week if not being followed actively by PCP for same problem but note he still looks to be vol overloaded at present and any changes in diuretic or bp meds will have to be carefully followed, especially given how difficult it was to communicate details of care today to him and his wife.

## 2020-04-06 DIAGNOSIS — R69 Illness, unspecified: Secondary | ICD-10-CM | POA: Diagnosis not present

## 2020-04-07 ENCOUNTER — Telehealth: Payer: Self-pay | Admitting: *Deleted

## 2020-04-07 DIAGNOSIS — N182 Chronic kidney disease, stage 2 (mild): Secondary | ICD-10-CM

## 2020-04-07 NOTE — Telephone Encounter (Signed)
Spoke with the pt  Notified of response per Dr Melvyn Novas I placed order for him to have labs done at [facility]

## 2020-04-07 NOTE — Progress Notes (Signed)
Spoke with pt and notified of results per Dr. Wert. Pt verbalized understanding and denied any questions. 

## 2020-04-07 NOTE — Telephone Encounter (Signed)
That's fine as long as rechecked w/in a week or two p change in losartan dosing as this can lead to worse kidney function

## 2020-04-07 NOTE — Telephone Encounter (Signed)
Spoke with the pt  He states just had labs done 2 wks ago to check kidney function  He sees nephrology - Dr Theador Hawthorne

## 2020-04-07 NOTE — Telephone Encounter (Signed)
-----   Message from Tanda Rockers, MD sent at 04/03/2020  2:10 PM EDT ----- Will need to see who is checking his labs as the losartan may be affecting his kidney funciton.  If the answer as I suspect is no one have him come 04/08/20 to [facility] for bmet or have whoever is following do it then

## 2020-04-09 ENCOUNTER — Encounter (HOSPITAL_COMMUNITY): Payer: Self-pay

## 2020-04-09 ENCOUNTER — Encounter (HOSPITAL_COMMUNITY)
Admission: RE | Admit: 2020-04-09 | Discharge: 2020-04-09 | Disposition: A | Discharge status: Home / Self Care | Payer: Medicare HMO | Source: Ambulatory Visit | Attending: Nephrology | Admitting: Nephrology

## 2020-04-09 ENCOUNTER — Other Ambulatory Visit (HOSPITAL_COMMUNITY)
Admission: RE | Admit: 2020-04-09 | Discharge: 2020-04-09 | Disposition: A | Discharge status: Home / Self Care | Payer: Medicare HMO | Source: Ambulatory Visit | Attending: Internal Medicine | Admitting: Internal Medicine

## 2020-04-09 ENCOUNTER — Other Ambulatory Visit: Payer: Self-pay

## 2020-04-09 DIAGNOSIS — N182 Chronic kidney disease, stage 2 (mild): Secondary | ICD-10-CM | POA: Diagnosis not present

## 2020-04-09 DIAGNOSIS — D631 Anemia in chronic kidney disease: Secondary | ICD-10-CM | POA: Insufficient documentation

## 2020-04-09 DIAGNOSIS — N184 Chronic kidney disease, stage 4 (severe): Secondary | ICD-10-CM | POA: Diagnosis present

## 2020-04-09 HISTORY — DX: Anemia, unspecified: D64.9

## 2020-04-09 LAB — BASIC METABOLIC PANEL
Anion gap: 10 (ref 5–15)
BUN: 26 mg/dL — ABNORMAL HIGH (ref 8–23)
CO2: 34 mmol/L — ABNORMAL HIGH (ref 22–32)
Calcium: 8.9 mg/dL (ref 8.9–10.3)
Chloride: 90 mmol/L — ABNORMAL LOW (ref 98–111)
Creatinine, Ser: 2.8 mg/dL — ABNORMAL HIGH (ref 0.61–1.24)
GFR calc Af Amer: 24 mL/min — ABNORMAL LOW (ref >60)
GFR calc non Af Amer: 21 mL/min — ABNORMAL LOW (ref >60)
Glucose, Bld: 398 mg/dL — ABNORMAL HIGH (ref 70–99)
Potassium: 3.8 mmol/L (ref 3.5–5.1)
Sodium: 134 mmol/L — ABNORMAL LOW (ref 135–145)

## 2020-04-09 LAB — POCT HEMOGLOBIN-HEMACUE: Hemoglobin: 8.5 g/dL — ABNORMAL LOW (ref 13.0–17.0)

## 2020-04-09 MED ORDER — EPOETIN ALFA-EPBX 3000 UNIT/ML IJ SOLN
3000.0000 [IU] | Freq: Once | INTRAMUSCULAR | Status: AC
  Administered 2020-04-09: 3000 [IU] via SUBCUTANEOUS

## 2020-04-09 MED ORDER — EPOETIN ALFA-EPBX 3000 UNIT/ML IJ SOLN
INTRAMUSCULAR | Status: AC
  Filled 2020-04-09: qty 1

## 2020-04-10 NOTE — Progress Notes (Signed)
Spoke with patient, his wife was with him during the time of the call.  I provided the results as listed in Dr. Gustavus Bryant note.  He verbalized understanding.  Pt is awaiting a call back from his pcp office to schedule a visit.  He states he just saw his kidney doctor on 04/03/2020.

## 2020-04-15 DIAGNOSIS — J9621 Acute and chronic respiratory failure with hypoxia: Secondary | ICD-10-CM | POA: Diagnosis not present

## 2020-04-15 DIAGNOSIS — I0981 Rheumatic heart failure: Secondary | ICD-10-CM | POA: Diagnosis not present

## 2020-04-15 DIAGNOSIS — J441 Chronic obstructive pulmonary disease with (acute) exacerbation: Secondary | ICD-10-CM | POA: Diagnosis not present

## 2020-04-15 DIAGNOSIS — J9622 Acute and chronic respiratory failure with hypercapnia: Secondary | ICD-10-CM | POA: Diagnosis not present

## 2020-04-15 DIAGNOSIS — N1832 Chronic kidney disease, stage 3b: Secondary | ICD-10-CM | POA: Diagnosis not present

## 2020-04-15 DIAGNOSIS — I5033 Acute on chronic diastolic (congestive) heart failure: Secondary | ICD-10-CM | POA: Diagnosis not present

## 2020-04-15 DIAGNOSIS — J440 Chronic obstructive pulmonary disease with acute lower respiratory infection: Secondary | ICD-10-CM | POA: Diagnosis not present

## 2020-04-15 DIAGNOSIS — J189 Pneumonia, unspecified organism: Secondary | ICD-10-CM | POA: Diagnosis not present

## 2020-04-15 DIAGNOSIS — M1030 Gout due to renal impairment, unspecified site: Secondary | ICD-10-CM | POA: Diagnosis not present

## 2020-04-15 DIAGNOSIS — I130 Hypertensive heart and chronic kidney disease with heart failure and stage 1 through stage 4 chronic kidney disease, or unspecified chronic kidney disease: Secondary | ICD-10-CM | POA: Diagnosis not present

## 2020-04-20 DIAGNOSIS — Z6832 Body mass index (BMI) 32.0-32.9, adult: Secondary | ICD-10-CM | POA: Diagnosis not present

## 2020-04-20 DIAGNOSIS — I10 Essential (primary) hypertension: Secondary | ICD-10-CM | POA: Diagnosis not present

## 2020-04-20 DIAGNOSIS — J449 Chronic obstructive pulmonary disease, unspecified: Secondary | ICD-10-CM | POA: Diagnosis not present

## 2020-04-20 DIAGNOSIS — I5032 Chronic diastolic (congestive) heart failure: Secondary | ICD-10-CM | POA: Diagnosis not present

## 2020-04-20 DIAGNOSIS — N189 Chronic kidney disease, unspecified: Secondary | ICD-10-CM | POA: Diagnosis not present

## 2020-04-20 DIAGNOSIS — I509 Heart failure, unspecified: Secondary | ICD-10-CM | POA: Diagnosis not present

## 2020-04-20 DIAGNOSIS — E119 Type 2 diabetes mellitus without complications: Secondary | ICD-10-CM | POA: Diagnosis not present

## 2020-04-23 ENCOUNTER — Telehealth: Payer: Self-pay | Admitting: Internal Medicine

## 2020-04-23 ENCOUNTER — Encounter (HOSPITAL_COMMUNITY)
Admission: RE | Admit: 2020-04-23 | Discharge: 2020-04-23 | Disposition: A | Discharge status: Home / Self Care | Payer: Medicare HMO | Source: Ambulatory Visit | Attending: Nephrology | Admitting: Nephrology

## 2020-04-23 ENCOUNTER — Other Ambulatory Visit: Payer: Self-pay

## 2020-04-23 DIAGNOSIS — N184 Chronic kidney disease, stage 4 (severe): Secondary | ICD-10-CM | POA: Diagnosis not present

## 2020-04-23 DIAGNOSIS — D631 Anemia in chronic kidney disease: Secondary | ICD-10-CM | POA: Diagnosis not present

## 2020-04-23 LAB — POCT HEMOGLOBIN-HEMACUE: Hemoglobin: 8.7 g/dL — ABNORMAL LOW (ref 13.0–17.0)

## 2020-04-23 MED ORDER — EPOETIN ALFA-EPBX 3000 UNIT/ML IJ SOLN
3000.0000 [IU] | Freq: Once | INTRAMUSCULAR | Status: AC
  Administered 2020-04-23: 3000 [IU] via SUBCUTANEOUS

## 2020-04-23 MED ORDER — EPOETIN ALFA-EPBX 3000 UNIT/ML IJ SOLN
INTRAMUSCULAR | Status: AC
  Filled 2020-04-23: qty 1

## 2020-04-23 MED ORDER — LOSARTAN POTASSIUM 50 MG PO TABS: ORAL_TABLET | ORAL | 2 refills | Status: DC

## 2020-04-23 NOTE — Telephone Encounter (Signed)
Rx sent to [facility] and pt made aware. Nothing further is needed.

## 2020-04-24 ENCOUNTER — Other Ambulatory Visit (HOSPITAL_COMMUNITY): Payer: Medicare HMO

## 2020-04-24 ENCOUNTER — Ambulatory Visit (HOSPITAL_COMMUNITY): Payer: Medicare HMO

## 2020-04-29 DIAGNOSIS — N184 Chronic kidney disease, stage 4 (severe): Secondary | ICD-10-CM | POA: Diagnosis not present

## 2020-04-29 DIAGNOSIS — I129 Hypertensive chronic kidney disease with stage 1 through stage 4 chronic kidney disease, or unspecified chronic kidney disease: Secondary | ICD-10-CM | POA: Diagnosis not present

## 2020-04-29 DIAGNOSIS — E7849 Other hyperlipidemia: Secondary | ICD-10-CM | POA: Diagnosis not present

## 2020-04-29 DIAGNOSIS — E1122 Type 2 diabetes mellitus with diabetic chronic kidney disease: Secondary | ICD-10-CM | POA: Diagnosis not present

## 2020-05-07 ENCOUNTER — Encounter (HOSPITAL_COMMUNITY)
Admission: RE | Admit: 2020-05-07 | Discharge: 2020-05-07 | Disposition: A | Discharge status: Home / Self Care | Payer: Medicare HMO | Source: Ambulatory Visit | Attending: Nephrology | Admitting: Nephrology

## 2020-05-07 ENCOUNTER — Ambulatory Visit (INDEPENDENT_AMBULATORY_CARE_PROVIDER_SITE_OTHER): Payer: Medicare HMO | Admitting: Gastroenterology

## 2020-05-07 ENCOUNTER — Other Ambulatory Visit: Payer: Self-pay

## 2020-05-07 ENCOUNTER — Encounter (INDEPENDENT_AMBULATORY_CARE_PROVIDER_SITE_OTHER): Payer: Self-pay | Admitting: Gastroenterology

## 2020-05-07 ENCOUNTER — Encounter (HOSPITAL_COMMUNITY): Payer: Self-pay

## 2020-05-07 DIAGNOSIS — D649 Anemia, unspecified: Secondary | ICD-10-CM | POA: Diagnosis not present

## 2020-05-07 DIAGNOSIS — N184 Chronic kidney disease, stage 4 (severe): Secondary | ICD-10-CM | POA: Insufficient documentation

## 2020-05-07 DIAGNOSIS — D631 Anemia in chronic kidney disease: Secondary | ICD-10-CM | POA: Diagnosis present

## 2020-05-07 LAB — POCT HEMOGLOBIN-HEMACUE: Hemoglobin: 7.3 g/dL — ABNORMAL LOW (ref 13.0–17.0)

## 2020-05-07 MED ORDER — EPOETIN ALFA-EPBX 3000 UNIT/ML IJ SOLN
INTRAMUSCULAR | Status: AC
  Filled 2020-05-07: qty 1

## 2020-05-07 MED ORDER — EPOETIN ALFA-EPBX 3000 UNIT/ML IJ SOLN
3000.0000 [IU] | Freq: Once | INTRAMUSCULAR | Status: AC
  Administered 2020-05-07: 3000 [IU] via SUBCUTANEOUS

## 2020-05-07 NOTE — Patient Instructions (Addendum)
Obtain clearance from pulmonology (Dr. Melvyn Novas) to schedule EGD and colonoscopy Check iron panel and CBC next week (Tuesday or Wednesday)

## 2020-05-07 NOTE — Progress Notes (Signed)
Andrew Adams, M.D. Gastroenterology & Hepatology [facility] [address] [place], [place] [address]  [place] [address]  Referring [id number] Andrew Baba, PA  I will communicate my assessment and recommendations to the referring MD via EMR. "Note: Occasional unusual wording and randomly placed punctuation marks may result from the use of speech recognition technology to transcribe this document"  Chief Complaint: Iron deficiency anemia  History of Present Illness: Andrew Adams is a 74 y.o. male with medical history significant for chronic diastolic heart failure, COPD, CKD, chronic alcohol abuse, hypertension, hyperlipidemia, GERD, recent hospitalization due to pneumonia currently on oxygen supplementation, who comes to clinic for evaluation of worsening iron deficiency anemia.    Patient reports that he is not sure why he was referred to our clinic.  He is a poor historian.  He denies having any melena, hematochezia, hematemesis or any overt bleeding.  He denies having any syncopal events but he endorses feeling dyspneic even though he has been on oxygen segmentation since his hospitalization.  The patient has had blood testing performed since his hospitalization in April which showed a baseline hemoglobin of 10.6 in 02/10/2020.  Subsequent blood draws between April and June showed worsening hemoglobin, with most recent hemoglobin from today of 7.3.  His MCV back in April was 92.1 and platelet count was 162.  Due to worsening anemia, he was recently started on Epogen by his nephrologist, he has received 3 doses of Epogen so far. He also had FIT testing which came back positive.  Patient was referred to our clinic for further testing of gastrointestinal losses.  The patient denies having any nausea, vomiting, fever, chills, hematochezia, melena, hematemesis, abdominal distention, abdominal pain, diarrhea, jaundice, pruritus or  weight changes.  Most recent echocardiogram, which was performed in December 2020, showed mild LVH, LVEF 55 to 89%, grade 1 diastolic dysfunction, no evidence of focal wall motion abnormalities.  Echo also showed moderately dilated left atrium and mild mitral regurgitation.  Last EGD: never Last Colonoscopy: Last colonoscopy performed 2015 showed 1 polyp in the cecum, 3 mm in diameter which was resected.  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  FHx: neg for any gastrointestinal/liver disease, no malignancies Social: Heavy smoker 1 pack every day until early 90s then he quit in 1996, patient used to drink 1 to 1-1/2 pints of vodka daily but stopped since his hospitalization in April 3810., neg illicit drug use Surgical: laparoscopy due to ?SBO but no bowel resection performed, abdominal hernia  Past Medical History: Past Medical History:  Diagnosis Date  . Anemia   . Borderline diabetic   . GERD (gastroesophageal reflux disease)   . Gout   . Gouty arthritis   . H/O small bowel obstruction   . History of back surgery   . Hyperlipidemia   . Hypertension   . PE (pulmonary embolism)     Past Surgical History: Past Surgical History:  Procedure Laterality Date  . ABDOMINAL SURGERY  2103   Smal bowel obstruction   . ABDOMINAL SURGERY  1981   Perforated large intestine  . back     Lower back  . CATARACT EXTRACTION W/PHACO Right 01/06/2020   Procedure: CATARACT EXTRACTION PHACO AND INTRAOCULAR LENS PLACEMENT (IOC) (CDE: 6.48);  Surgeon: [name], MD;  Location: [facility];  Service: Ophthalmology;  Laterality: Right;  . CATARACT EXTRACTION W/PHACO Left 01/20/2020   Procedure:  CATARACT EXTRACTION PHACO AND INTRAOCULAR LENS PLACEMENT (IOC);  Surgeon: [name], MD;  Location: [facility];  Service: Ophthalmology;  Laterality: Left;  CDE: 5.86  . HERNIA REPAIR      Family History: Family History  Problem Relation Age of  Onset  . Hypertension Sister   . Diabetes Sister   . Diabetes Sister   . Hypertension Mother   . Diabetes Mother   . Hypertension Father   . Ulcers Father     Social History: Social History   Tobacco Use  Smoking Status Former Smoker  . Packs/day: 1.00  . Years: 38.00  . Pack years: 38.00  . Types: Cigarettes  . Quit date: 02/07/1995  . Years since quitting: 25.2  Smokeless Tobacco Never Used   Social History   Substance and Sexual Activity  Alcohol Use Yes   Comment: occasional    Social History   Substance and Sexual Activity  Drug Use No    Allergies: No Known Allergies  Medications: Current Outpatient Medications  Medication Sig Dispense Refill  . allopurinol (ZYLOPRIM) 100 MG tablet Take 1 tablet (100 mg total) by mouth daily. 90 tablet 1  . amLODipine (NORVASC) 10 MG tablet Take 5 mg by mouth daily.     Marland Kitchen atorvastatin (LIPITOR) 40 MG tablet Take 1 tablet (40 mg total) by mouth daily. 90 tablet 1  . epoetin alfa-epbx (RETACRIT) 3000 UNIT/ML injection 3,000 Units every 14 (fourteen) days.    . fenofibrate (TRICOR) 145 MG tablet Take 145 mg by mouth daily.    . furosemide (LASIX) 40 MG tablet Take 40 mg by mouth.    Marland Kitchen ipratropium-albuterol (DUONEB) 0.5-2.5 (3) MG/3ML SOLN Take 3 mLs by nebulization 3 (three) times daily. 360 mL 0  . losartan (COZAAR) 50 MG tablet One twice daily 60 tablet 2  . metoprolol (LOPRESSOR) 50 MG tablet Take 1 tablet (50 mg total) by mouth 2 (two) times daily. 180 tablet 1  . Multiple Vitamin (MULTIVITAMIN WITH MINERALS) TABS tablet Take 1 tablet by mouth daily.    . pantoprazole (PROTONIX) 40 MG tablet Take 1 tablet (40 mg total) by mouth daily. 90 tablet 1  . thiamine 250 MG tablet Take 250 mg by mouth daily.    . TRULICITY 3.81 WE/9.9BZ SOPN SMARTSIG:0.5 Milliliter(s) SUB-Q Once a Week     No current facility-administered medications for this visit.    Review of Systems: GENERAL: negative for malaise, significant weight loss,  night sweats and fever HEENT: No changes in hearing or vision, no nose bleeds or other nasal problems. No trouble swallowing NECK: Negative for lumps, goiter, pain and significant neck swelling RESPIRATORY: Negative for cough, wheezing and shortness of breath CARDIOVASCULAR: Negative for chest pain, leg swelling, palpitations, orthopnea GI: SEE HPI MUSCULOSKELETAL: Negative for joint pain or swelling, back pain, and muscle pain. SKIN: Negative for lesions, rash, and itching. PSYCH: Negative for sleep disturbance, mood disorder and recent psychosocial stressors. HEMATOLOGY Negative for prolonged bleeding, bruising easily, and swollen nodes. ENDOCRINE: Negative for cold or heat intolerance, polyuria, polydipsia and goiter. NEURO: negative for lightheadedness, dizziness, tremor, gait imbalance, syncope and seizures. The remainder of the review of systems is noncontributory.   Physical Exam: BP (!) 162/95 (BP Location: Right Arm, Patient Position: Sitting, Cuff Size: Normal)   Pulse 76   Temp 98.7 F (37.1 C) (Oral)   Ht 5' 8.5" (1.74 m)   Wt 216 lb 4.8 oz (98.1 kg)   BMI 32.41 kg/m  GENERAL: The patient is AO  x3, in no acute distress. Currently using portable oxygen by [place]. HEENT: Head is normocephalic and atraumatic. EOMI are intact. Mouth is well hydrated and without lesions. NECK: Supple. No masses LUNGS: Clear to auscultation. No presence of rhonchi/wheezing/rales. Adequate chest expansion HEART: RRR, normal s1 and s2. ABDOMEN: Soft, nontender, no guarding, no peritoneal signs, and nondistended. BS +. No masses. EXTREMITIES: Without any cyanosis, clubbing, rash, lesions. Has +1 pitting edema bilaterally in both shins. NEUROLOGIC: AOx3, no focal motor deficit. SKIN: no jaundice, no rashes   Imaging/Labs: as above  I personally reviewed and interpreted the available labs, imaging and endoscopic files.  Impression and Plan: Andrew Adams is a 74 y.o. male with medical history  significant for chronic diastolic heart failure, COPD, CKD, chronic alcohol abuse, hypertension, hyperlipidemia, GERD, recent hospitalization due to pneumonia currently on oxygen supplementation, who comes to clinic for evaluation of worsening iron deficiency anemia.  The patient has presented worsening anemia throughout the last 3 months without any sign of overt gastrointestinal bleeding clinically.  However, he has not adequately responded to IV iron infusions, he will need to undergo an endoscopic and colonoscopy evaluation.  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  We will proceed with both procedures once he has been cleared by his pulmonologist given the fact the patient still requires supplemental oxygenation.  For now, will recommend checking a repeat CBC and iron studies next week as he may require blood transfusion if his hemoglobin drops below 7 g/dL.  Patient understood and agreed.  - Obtain clearance from pulmonology (Dr. Melvyn Novas) to schedule EGD and colonoscopy - Check iron panel and CBC next week (Tuesday or Wednesday)  All questions were answered.      Harvel Quale, MD

## 2020-05-08 ENCOUNTER — Telehealth: Payer: Self-pay

## 2020-05-08 ENCOUNTER — Encounter: Payer: Self-pay | Admitting: Pulmonary Disease

## 2020-05-08 ENCOUNTER — Ambulatory Visit: Payer: Medicare HMO | Admitting: Pulmonary Disease

## 2020-05-08 ENCOUNTER — Telehealth (INDEPENDENT_AMBULATORY_CARE_PROVIDER_SITE_OTHER): Payer: Self-pay | Admitting: Gastroenterology

## 2020-05-08 VITALS — BP 154/92 | HR 77 | Temp 98.6°F | Ht 69.0 in | Wt 216.4 lb

## 2020-05-08 DIAGNOSIS — R0683 Snoring: Secondary | ICD-10-CM

## 2020-05-08 DIAGNOSIS — J9612 Chronic respiratory failure with hypercapnia: Secondary | ICD-10-CM | POA: Diagnosis not present

## 2020-05-08 DIAGNOSIS — J9611 Chronic respiratory failure with hypoxia: Secondary | ICD-10-CM

## 2020-05-08 DIAGNOSIS — J449 Chronic obstructive pulmonary disease, unspecified: Secondary | ICD-10-CM | POA: Diagnosis not present

## 2020-05-08 LAB — IRON,TIBC AND FERRITIN PANEL
%SAT: 18 % (calc) — ABNORMAL LOW (ref 20–48)
Ferritin: 14 ng/mL — ABNORMAL LOW (ref 24–380)
Iron: 81 ug/dL (ref 50–180)
TIBC: 441 mcg/dL (calc) — ABNORMAL HIGH (ref 250–425)

## 2020-05-08 LAB — CBC
HCT: 30 % — ABNORMAL LOW (ref 38.5–50.0)
Hemoglobin: 9.5 g/dL — ABNORMAL LOW (ref 13.2–17.1)
MCH: 26.9 pg — ABNORMAL LOW (ref 27.0–33.0)
MCHC: 31.7 g/dL — ABNORMAL LOW (ref 32.0–36.0)
MCV: 85 fL (ref 80.0–100.0)
MPV: 12.1 fL (ref 7.5–12.5)
Platelets: 219 10*3/uL (ref 140–400)
RBC: 3.53 10*6/uL — ABNORMAL LOW (ref 4.20–5.80)
RDW: 12.5 % (ref 11.0–15.0)
WBC: 3.9 10*3/uL (ref 3.8–10.8)

## 2020-05-08 NOTE — Telephone Encounter (Signed)
I called the patient today to inform him that his hemoglobin level is better compared to prior (9.5), however his ferritin level is still low 14 and percentage saturation is 18%.  He went to his pulmonologist appointment today but did not request the clearance for his endoscopic procedures as requested.  I sent a message to Dr. Halford Chessman via Epic requesting clearance.  I also asked the patient to ask his pulmonologist again if he could evaluate him for preprocedural clearance.  The patient understood and agreed.  Harvel Quale, MD Gastroenterology and Hepatology

## 2020-05-08 NOTE — Patient Instructions (Signed)
Will arrange for pulmonary function test, in lab sleep study, and portable oxygen concentrator  Follow up in 8 weeks

## 2020-05-08 NOTE — Telephone Encounter (Signed)
I sent message to Dr. Catalina Lunger asking how urgent it is to do EGD and colonoscopy now versus waiting until further pulmonary testing is completed.  Awaiting his response.

## 2020-05-08 NOTE — Telephone Encounter (Signed)
Patient's wife just came by stating that Dr Harvel Quale wants to know if patient can be cleared for colonoscopy procedure  Please advise

## 2020-05-08 NOTE — Telephone Encounter (Signed)
Ok will wait from response from Dr Carmelina Dane with patient and updated on communication is established between the two doctors. Nothing further needed at this time

## 2020-05-08 NOTE — Progress Notes (Signed)
[facility]  Chief Complaint  Patient presents with  . Follow-up    shortness of breath when walking more than 100 feet    Constitutional:  BP (!) 154/92 (BP Location: Right Arm, Cuff Size: Normal)   Pulse 77   Temp 98.6 F (37 C) (Oral)   Ht 5\' 9"  (1.753 m)   Wt 216 lb 6.4 oz (98.2 kg)   SpO2 100%   BMI 31.96 kg/m   Past Medical History:  Diastolic CHF, ETOH, HTN, HLD, PE Sept 2013, Gout, GERD, CKD 3b, PNA  Summary:  Andrew Adams is a 74 y.o. male former smoker with COPD, chronic respiratory failure and snoring.  Subjective:   He uses duoneb 3 times per day.  This helps some.  Not having cough, wheeze, sputum, chest pain, leg swelling.  Uses 2 liters 24/7.  Wants a POC.  Snores, and stops breathing at night.  Sleepy all day long.   Physical Exam:   Appearance - well kempt, wearing oxygen  ENMT - no sinus tenderness, no nasal discharge, no oral exudate, Mallampati 3, poor dentition  Respiratory - decreased breath sounds, no wheeze, or rales  CV - regular rate and rhythm, no murmurs  GI - soft, non tender  Lymph - no adenopathy noted in neck  Ext - no edema  Skin - no rashes  Neuro - normal strength, oriented x 3  Psych - normal mood and affect  Assessment/Plan:   COPD mixed type. - will arrange for PFT - continue duoneb prn  Snoring. - likely has OSA - needs to do in lab sleep study to further assess since he has COPD and respiratory failure  Chronic respiratory failure with hypoxia and hypercapnia. - continue 2 liters 24/7 - will arrange for POC - uses [employer]   A total of  25 minutes spent addressing patient care issues on day of visit.  Follow up:  Patient Instructions  Will arrange for pulmonary function test, in lab sleep study, and portable oxygen concentrator  Follow up in 8 weeks   Signature:  Chesley Mires, MD [place] Pager: [phone] 05/08/2020, 12:35 PM   Flow Sheet     Pulmonary tests:   ABG on 32% FiO2 02/13/20 >>  PH 7.347, PCO2 65.8, PO2 74.4  Sleep tests:    Cardiac tests:   Echo 10/30/19 >> EF 55 to 60%, grade 1 DD, mod LA dilation  Medications:   Allergies as of 05/08/2020   No Known Allergies     Medication List       Accurate as of May 08, 2020 12:35 PM. If you have any questions, ask your nurse or doctor.        allopurinol 100 MG tablet Commonly known as: ZYLOPRIM Take 1 tablet (100 mg total) by mouth daily.   amLODipine 10 MG tablet Commonly known as: NORVASC Take 5 mg by mouth daily.   atorvastatin 40 MG tablet Commonly known as: LIPITOR Take 1 tablet (40 mg total) by mouth daily.   fenofibrate 145 MG tablet Commonly known as: TRICOR Take 145 mg by mouth daily.   furosemide 40 MG tablet Commonly known as: LASIX Take 40 mg by mouth.   ipratropium-albuterol 0.5-2.5 (3) MG/3ML Soln Commonly known as: DUONEB Take 3 mLs by nebulization 3 (three) times daily.   losartan 50 MG tablet Commonly known as: COZAAR One twice daily   metoprolol tartrate 50 MG tablet Commonly known as: LOPRESSOR Take 1 tablet (  50 mg total) by mouth 2 (two) times daily.   multivitamin with minerals Tabs tablet Take 1 tablet by mouth daily.   pantoprazole 40 MG tablet Commonly known as: PROTONIX Take 1 tablet (40 mg total) by mouth daily.   Retacrit 3000 UNIT/ML injection Generic drug: epoetin alfa-epbx 3,000 Units every 14 (fourteen) days.   thiamine 250 MG tablet Take 250 mg by mouth daily.   Trulicity 1.15 BW/6.2MB Sopn Generic drug: Dulaglutide SMARTSIG:0.5 Milliliter(s) SUB-Q Once a Week       Past Surgical History:  He  has a past surgical history that includes Abdominal surgery (2103); Abdominal surgery (1981); Cataract extraction w/PHACO (Right, 01/06/2020); Cataract extraction w/PHACO (Left, 01/20/2020); Hernia repair; and back.  Family History:  His family history includes Diabetes in his mother,  sister, and sister; Hypertension in his father, mother, and sister; Ulcers in his father.  Social History:  He  reports that he quit smoking about 25 years ago. His smoking use included cigarettes. He has a 38.00 pack-year smoking history. He has never used smokeless tobacco. He reports current alcohol use. He reports that he does not use drugs.

## 2020-05-20 DIAGNOSIS — J449 Chronic obstructive pulmonary disease, unspecified: Secondary | ICD-10-CM | POA: Diagnosis not present

## 2020-05-20 DIAGNOSIS — I509 Heart failure, unspecified: Secondary | ICD-10-CM | POA: Diagnosis not present

## 2020-05-21 ENCOUNTER — Encounter (HOSPITAL_COMMUNITY)
Admission: RE | Admit: 2020-05-21 | Discharge: 2020-05-21 | Disposition: A | Discharge status: Home / Self Care | Payer: Medicare HMO | Source: Ambulatory Visit | Attending: Nephrology | Admitting: Nephrology

## 2020-05-21 ENCOUNTER — Other Ambulatory Visit: Payer: Self-pay

## 2020-05-21 ENCOUNTER — Encounter (HOSPITAL_COMMUNITY): Payer: Self-pay

## 2020-05-21 DIAGNOSIS — N184 Chronic kidney disease, stage 4 (severe): Secondary | ICD-10-CM | POA: Diagnosis not present

## 2020-05-21 LAB — POCT HEMOGLOBIN-HEMACUE: Hemoglobin: 8.7 g/dL — ABNORMAL LOW (ref 13.0–17.0)

## 2020-05-21 MED ORDER — EPOETIN ALFA-EPBX 3000 UNIT/ML IJ SOLN
3000.0000 [IU] | Freq: Once | INTRAMUSCULAR | Status: AC
  Administered 2020-05-21: 3000 [IU] via SUBCUTANEOUS
  Filled 2020-05-21: qty 1

## 2020-05-29 DIAGNOSIS — E1122 Type 2 diabetes mellitus with diabetic chronic kidney disease: Secondary | ICD-10-CM | POA: Diagnosis not present

## 2020-05-29 DIAGNOSIS — E7849 Other hyperlipidemia: Secondary | ICD-10-CM | POA: Diagnosis not present

## 2020-05-29 DIAGNOSIS — N184 Chronic kidney disease, stage 4 (severe): Secondary | ICD-10-CM | POA: Diagnosis not present

## 2020-05-29 DIAGNOSIS — I129 Hypertensive chronic kidney disease with stage 1 through stage 4 chronic kidney disease, or unspecified chronic kidney disease: Secondary | ICD-10-CM | POA: Diagnosis not present

## 2020-05-31 ENCOUNTER — Other Ambulatory Visit: Payer: Self-pay

## 2020-05-31 ENCOUNTER — Ambulatory Visit: Payer: Medicare HMO | Attending: Pulmonary Disease | Admitting: Pulmonary Disease

## 2020-05-31 DIAGNOSIS — G4733 Obstructive sleep apnea (adult) (pediatric): Secondary | ICD-10-CM | POA: Insufficient documentation

## 2020-05-31 DIAGNOSIS — J449 Chronic obstructive pulmonary disease, unspecified: Secondary | ICD-10-CM

## 2020-05-31 DIAGNOSIS — J9611 Chronic respiratory failure with hypoxia: Secondary | ICD-10-CM

## 2020-05-31 DIAGNOSIS — R0683 Snoring: Secondary | ICD-10-CM

## 2020-05-31 DIAGNOSIS — J9612 Chronic respiratory failure with hypercapnia: Secondary | ICD-10-CM | POA: Insufficient documentation

## 2020-06-04 ENCOUNTER — Ambulatory Visit (INDEPENDENT_AMBULATORY_CARE_PROVIDER_SITE_OTHER): Payer: Medicare HMO | Admitting: Gastroenterology

## 2020-06-04 ENCOUNTER — Other Ambulatory Visit: Payer: Self-pay

## 2020-06-04 ENCOUNTER — Encounter (INDEPENDENT_AMBULATORY_CARE_PROVIDER_SITE_OTHER): Payer: Self-pay | Admitting: Gastroenterology

## 2020-06-04 VITALS — BP 157/86 | HR 87 | Temp 98.8°F | Ht 69.0 in | Wt 218.5 lb

## 2020-06-04 DIAGNOSIS — D508 Other iron deficiency anemias: Secondary | ICD-10-CM

## 2020-06-04 MED ORDER — FERROUS SULFATE 325 (65 FE) MG PO TABS: 325.0000 mg | ORAL_TABLET | Freq: Every day | ORAL | 1 refills | Status: DC

## 2020-06-04 NOTE — Progress Notes (Signed)
Andrew Adams, M.D. Gastroenterology & Hepatology [facility] [address] [place], [place] [address]  Primary Care Physician: Lavella Lemons, [place] [address]  I will communicate my assessment and recommendations to the referring MD via EMR. "Note: Occasional unusual wording and randomly placed punctuation marks may result from the use of speech recognition technology to transcribe this document"  Problems: 1. Iron deficiency anemia  History of Present Illness: Andrew Adams is a 74 y.o.malewith medical history significant forchronic diastolic heart failure, COPD, CKD, chronic alcohol abuse, hypertension, hyperlipidemia, GERD, recent hospitalization due to pneumonia currently on oxygen supplementation, who comes to clinic for follow-up of iron deficiency anemia.  The patient was last seen 05/07/2020.He was ordered a repeat blood work-up that day to evaluate his iron stores. The patient had hemoglobin of 9.5 with MCV 85, rest of cell lines within normal limits, ferritin remained low 14 iron 81 and saturation of 18%. He subsequently underwent a repeat CBC on 05/21/2020 which was 8.7.   I messaged Dr. Halford Chessman (pulmonology) to obtain clearance to undergo EGD and colonoscopy but per his assessment if this was not an urgent procedure he should wait until he was optimized from the pulmonary standpoint.  The patient states feeling well but endorses having persistent fatigue. He saw his pulmonologist after our appointment and is undergoing evaluation by their team. The patient otherwise denies having any overt gastrointestinal bleeding such as melena, hematochezia or hematemesis. Denies any other complaint. Denies having any nausea, vomiting, fever, chills,  abdominal distention, abdominal pain, diarrhea, jaundice, pruritus or weight loss.  Patient has not been taking iron recently as he thought he did not need this  medication anymore.  Past Medical History: Past Medical History:  Diagnosis Date  . Anemia   . Borderline diabetic   . GERD (gastroesophageal reflux disease)   . Gout   . Gouty arthritis   . H/O small bowel obstruction   . History of back surgery   . Hyperlipidemia   . Hypertension   . PE (pulmonary embolism)     Past Surgical History: Past Surgical History:  Procedure Laterality Date  . ABDOMINAL SURGERY  2103   Smal bowel obstruction   . ABDOMINAL SURGERY  1981   Perforated large intestine  . back     Lower back  . CATARACT EXTRACTION W/PHACO Right 01/06/2020   Procedure: CATARACT EXTRACTION PHACO AND INTRAOCULAR LENS PLACEMENT (IOC) (CDE: 6.48);  Surgeon: [name], MD;  Location: [facility];  Service: Ophthalmology;  Laterality: Right;  . CATARACT EXTRACTION W/PHACO Left 01/20/2020   Procedure: CATARACT EXTRACTION PHACO AND INTRAOCULAR LENS PLACEMENT (IOC);  Surgeon: [name], MD;  Location: [facility];  Service: Ophthalmology;  Laterality: Left;  CDE: 5.86  . HERNIA REPAIR      Family History: Family History  Problem Relation Age of Onset  . Hypertension Sister   . Diabetes Sister   . Diabetes Sister   . Hypertension Mother   . Diabetes Mother   . Hypertension Father   . Ulcers Father     Social History: Social History   Tobacco Use  Smoking Status Former Smoker  . Packs/day: 1.00  . Years: 38.00  . Pack years: 38.00  . Types: Cigarettes  . Quit date: 02/07/1995  . Years since quitting: 25.3  Smokeless Tobacco Never Used   Social History   Substance and Sexual Activity  Alcohol Use Yes   Comment: occasional  Social History   Substance and Sexual Activity  Drug Use No    Allergies: No Known Allergies  Medications: Current Outpatient Medications  Medication Sig Dispense Refill  . allopurinol (ZYLOPRIM) 100 MG tablet Take 1 tablet (100 mg total) by mouth daily. 90 tablet 1  . amLODipine (NORVASC) 10 MG tablet Take 5 mg by mouth daily.      Marland Kitchen atorvastatin (LIPITOR) 40 MG tablet Take 1 tablet (40 mg total) by mouth daily. 90 tablet 1  . epoetin alfa-epbx (RETACRIT) 3000 UNIT/ML injection 3,000 Units every 14 (fourteen) days.    . furosemide (LASIX) 40 MG tablet Take 40 mg by mouth.    Marland Kitchen ipratropium-albuterol (DUONEB) 0.5-2.5 (3) MG/3ML SOLN Take 3 mLs by nebulization 3 (three) times daily. 360 mL 0  . losartan (COZAAR) 50 MG tablet One twice daily 60 tablet 2  . metoprolol (LOPRESSOR) 50 MG tablet Take 1 tablet (50 mg total) by mouth 2 (two) times daily. 180 tablet 1  . Multiple Vitamin (MULTIVITAMIN WITH MINERALS) TABS tablet Take 1 tablet by mouth daily.    . pantoprazole (PROTONIX) 40 MG tablet Take 1 tablet (40 mg total) by mouth daily. 90 tablet 1  . thiamine 250 MG tablet Take 250 mg by mouth daily.    . TRULICITY 1.61 WR/6.0AV SOPN SMARTSIG:0.5 Milliliter(s) SUB-Q Once a Week    . fenofibrate (TRICOR) 145 MG tablet Take 145 mg by mouth daily. (Patient not taking: Reported on 06/04/2020)    . ferrous sulfate (FERROUSUL) 325 (65 FE) MG tablet Take 1 tablet (325 mg total) by mouth daily with breakfast. 90 tablet 1   No current facility-administered medications for this visit.    Review of Systems: GENERAL: negative for malaise, night sweats HEENT: No changes in hearing or vision, no nose bleeds or other nasal problems. NECK: Negative for lumps, goiter, pain and significant neck swelling RESPIRATORY: Negative for cough, wheezing CARDIOVASCULAR: Negative for chest pain, leg swelling, palpitations, orthopnea GI: SEE HPI MUSCULOSKELETAL: Negative for joint pain or swelling, back pain, and muscle pain. SKIN: Negative for lesions, rash PSYCH: Negative for sleep disturbance, mood disorder and recent psychosocial stressors. HEMATOLOGY Negative for prolonged bleeding, bruising easily, and swollen nodes. ENDOCRINE: Negative for cold or heat intolerance, polyuria, polydipsia and goiter. NEURO: negative for tremor, gait imbalance,  syncope and seizures. The remainder of the review of systems is noncontributory.   Physical Exam: BP (!) 157/86 (BP Location: Right Arm, Cuff Size: Large)   Pulse 87   Temp 98.8 F (37.1 C) (Oral)   Ht 5\' 9"  (1.753 m)   Wt 218 lb 8 oz (99.1 kg)   BMI 32.27 kg/m  GENERAL: The patient is AO x3, in no acute distress. Currently using portable oxygen by [place]. HEENT: Head is normocephalic and atraumatic. EOMI are intact. Mouth is well hydrated and without lesions. NECK: Supple. No masses LUNGS: Clear to auscultation. No presence of rhonchi/wheezing/rales. Adequate chest expansion HEART: RRR, normal s1 and s2. ABDOMEN: Soft, nontender, no guarding, no peritoneal signs, and nondistended. BS +. No masses. EXTREMITIES: Without any cyanosis, clubbing, rash, lesions. Has +1 pitting edema bilaterally in both shins. NEUROLOGIC: AOx3, no focal motor deficit. SKIN: no jaundice, no rashes  Imaging/Labs: as above  I personally reviewed and interpreted the available labs, imaging and endoscopic files.  Impression and Plan: Andrew Adams is a 74 y.o.malewith medical history significant forchronic diastolic heart failure, COPD, CKD, chronic alcohol abuse, hypertension, hyperlipidemia, GERD, recent hospitalization due to pneumonia currently on oxygen  supplementation, who comes to clinic for follow-up of iron deficiency anemia. The patient has not presented any episodes of overt gastrointestinal bleeding but has remain with iron deficiency anemia. It would be important to investigate this further with an EGD and colonoscopy but, as I explained to the patient it would be important for him to have optimization of his respiratory system before giving him sedation for this purpose. As agreed with Dr. Halford Chessman, he will obtain clearance for this procedure was he has been optimized. For now, the patient was counseled to continue on his iron supplementation and to obtain repeat CBC and iron stores at the end of  September as part of his control labs. The patient understood and agreed.  - Restart oral iron tablets daily - Perform repeat CBC and iron stores at the end of September  - Follow up with Dr. Halford Chessman (pulmonology) to obtain clearance for EGD and colonoscopy  All questions were answered.      Harvel Quale, MD Gastroenterology and Hepatology [facility]

## 2020-06-04 NOTE — Patient Instructions (Signed)
Restart oral iron tablets daily Perform blood workup at the end of September of this year Follow up with Dr. Halford Chessman (pulmonology) to obtain clearance for EGD and colonoscopy

## 2020-06-05 ENCOUNTER — Encounter (HOSPITAL_COMMUNITY)
Admission: RE | Admit: 2020-06-05 | Discharge: 2020-06-05 | Disposition: A | Discharge status: Home / Self Care | Payer: Medicare HMO | Source: Ambulatory Visit | Attending: Nephrology | Admitting: Nephrology

## 2020-06-05 ENCOUNTER — Encounter (HOSPITAL_COMMUNITY): Payer: Self-pay

## 2020-06-05 DIAGNOSIS — N184 Chronic kidney disease, stage 4 (severe): Secondary | ICD-10-CM | POA: Diagnosis present

## 2020-06-05 DIAGNOSIS — D631 Anemia in chronic kidney disease: Secondary | ICD-10-CM | POA: Insufficient documentation

## 2020-06-05 LAB — POCT HEMOGLOBIN-HEMACUE: Hemoglobin: 8.2 g/dL — ABNORMAL LOW (ref 13.0–17.0)

## 2020-06-05 MED ORDER — EPOETIN ALFA-EPBX 3000 UNIT/ML IJ SOLN
INTRAMUSCULAR | Status: AC
  Filled 2020-06-05: qty 1

## 2020-06-05 MED ORDER — EPOETIN ALFA-EPBX 3000 UNIT/ML IJ SOLN
3000.0000 [IU] | Freq: Once | INTRAMUSCULAR | Status: AC
  Administered 2020-06-05: 3000 [IU] via SUBCUTANEOUS

## 2020-06-16 ENCOUNTER — Telehealth: Payer: Self-pay | Admitting: Pulmonary Disease

## 2020-06-16 DIAGNOSIS — K219 Gastro-esophageal reflux disease without esophagitis: Secondary | ICD-10-CM | POA: Diagnosis not present

## 2020-06-16 DIAGNOSIS — I10 Essential (primary) hypertension: Secondary | ICD-10-CM | POA: Diagnosis not present

## 2020-06-16 DIAGNOSIS — E782 Mixed hyperlipidemia: Secondary | ICD-10-CM | POA: Diagnosis not present

## 2020-06-16 NOTE — Procedures (Signed)
    Patient Name: Andrew Adams, Andrew Adams Date: 05/31/2020 Gender: Male D.O.B: December 01, 1945 Age (years): 27 Referring Provider: Chesley Mires MD, ABSM Height (inches): 37 Interpreting Physician: Chesley Mires MD, ABSM Weight (lbs): 216 RPSGT: Rosebud Poles BMI: 32 MRN: [id number] Neck Size: 17.50  CLINICAL INFORMATION Sleep Study Type: NPSG  Indication for sleep study: Snoring, history of COPD and chronic respiratory failure.  Epworth Sleepiness Score: 15  SLEEP STUDY TECHNIQUE As per the AASM Manual for the Scoring of Sleep and Associated Events v2.3 (April 2016) with a hypopnea requiring 4% desaturations.  The channels recorded and monitored were frontal, central and occipital EEG, electrooculogram (EOG), submentalis EMG (chin), nasal and oral airflow, thoracic and abdominal wall motion, anterior tibialis EMG, snore microphone, electrocardiogram, and pulse oximetry.  MEDICATIONS Medications self-administered by patient taken the night of the study : N/A  SLEEP ARCHITECTURE The study was initiated at 9:58:02 PM and ended at 4:31:46 AM.  Sleep onset time was 10.4 minutes and the sleep efficiency was 88.0%%. The total sleep time was 346.4 minutes.  Stage REM latency was 217.5 minutes.  The patient spent 2.2%% of the night in stage N1 sleep, 75.9%% in stage N2 sleep, 11.8%% in stage N3 and 10.1% in REM.  Alpha intrusion was absent.  Supine sleep was 27.93%.  RESPIRATORY PARAMETERS The overall apnea/hypopnea index (AHI) was 29.4 per hour. There were 0 total apneas, including 0 obstructive, 0 central and 0 mixed apneas. There were 170 hypopneas and 0 RERAs.  The AHI during Stage REM sleep was 41.1 per hour.  AHI while supine was 50.2 per hour.  The mean oxygen saturation was 96.5%. The minimum SpO2 during sleep was 73.0%.  Used 2 liters oxygen during the study.  loud snoring was noted during this study.  CARDIAC DATA The 2 lead EKG demonstrated sinus rhythm. The mean  heart rate was 78.3 beats per minute. Other EKG findings include: PVCs.  LEG MOVEMENT DATA The total PLMS were 22 with a resulting PLMS index of 3.8. Associated arousal with leg movement index was 9.2 .  IMPRESSIONS - Moderate obstructive sleep apnea occurred during this study (AHI = 29.4/h). - No significant central sleep apnea occurred during this study (CAI = 0.0/h). - Moderate oxygen desaturation was noted during this study (Min O2 = 73.0%). - The patient snored with loud snoring volume. - EKG findings include PVCs.  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  [Electronically signed] 06/16/2020 08:27 AM  Chesley Mires MD, ABSM Diplomate, American Board of Sleep Medicine   NPI: [id number]

## 2020-06-16 NOTE — Telephone Encounter (Signed)
PSG 05/31/20 >> AHI 29.4, SpO2 low 73%.  Used 2 liters oxygen.   Please inform him that his sleep study shows moderate obstructive sleep apnea.  Please arrange for ROV with me or NP to discuss treatment options.

## 2020-06-16 NOTE — Telephone Encounter (Signed)
Called and spoke to pt. Informed him of the results per Dr. Halford Chessman. Appt scheduled for 8/25 with Derl Barrow, NP. Pt verbalized understanding and denied any further questions or concerns at this time.

## 2020-06-19 ENCOUNTER — Encounter (HOSPITAL_COMMUNITY): Payer: Self-pay

## 2020-06-19 ENCOUNTER — Encounter (HOSPITAL_COMMUNITY)
Admission: RE | Admit: 2020-06-19 | Discharge: 2020-06-19 | Disposition: A | Discharge status: Home / Self Care | Payer: Medicare HMO | Source: Ambulatory Visit | Attending: Nephrology | Admitting: Nephrology

## 2020-06-19 ENCOUNTER — Other Ambulatory Visit: Payer: Self-pay

## 2020-06-19 DIAGNOSIS — I129 Hypertensive chronic kidney disease with stage 1 through stage 4 chronic kidney disease, or unspecified chronic kidney disease: Secondary | ICD-10-CM | POA: Diagnosis not present

## 2020-06-19 DIAGNOSIS — N184 Chronic kidney disease, stage 4 (severe): Secondary | ICD-10-CM | POA: Diagnosis not present

## 2020-06-19 DIAGNOSIS — D508 Other iron deficiency anemias: Secondary | ICD-10-CM | POA: Diagnosis not present

## 2020-06-19 DIAGNOSIS — E1122 Type 2 diabetes mellitus with diabetic chronic kidney disease: Secondary | ICD-10-CM | POA: Diagnosis not present

## 2020-06-19 DIAGNOSIS — N189 Chronic kidney disease, unspecified: Secondary | ICD-10-CM | POA: Diagnosis not present

## 2020-06-19 DIAGNOSIS — I5032 Chronic diastolic (congestive) heart failure: Secondary | ICD-10-CM | POA: Diagnosis not present

## 2020-06-19 DIAGNOSIS — D631 Anemia in chronic kidney disease: Secondary | ICD-10-CM | POA: Diagnosis not present

## 2020-06-19 LAB — RENAL FUNCTION PANEL
Albumin: 3.8 g/dL (ref 3.5–5.0)
Anion gap: 11 (ref 5–15)
BUN: 24 mg/dL — ABNORMAL HIGH (ref 8–23)
CO2: 30 mmol/L (ref 22–32)
Calcium: 8.9 mg/dL (ref 8.9–10.3)
Chloride: 97 mmol/L — ABNORMAL LOW (ref 98–111)
Creatinine, Ser: 2.37 mg/dL — ABNORMAL HIGH (ref 0.61–1.24)
GFR calc Af Amer: 30 mL/min — ABNORMAL LOW (ref >60)
GFR calc non Af Amer: 26 mL/min — ABNORMAL LOW (ref >60)
Glucose, Bld: 131 mg/dL — ABNORMAL HIGH (ref 70–99)
Phosphorus: 4.3 mg/dL (ref 2.5–4.6)
Potassium: 3.6 mmol/L (ref 3.5–5.1)
Sodium: 138 mmol/L (ref 135–145)

## 2020-06-19 LAB — CBC
HCT: 24.8 % — ABNORMAL LOW (ref 39.0–52.0)
Hemoglobin: 8 g/dL — ABNORMAL LOW (ref 13.0–17.0)
MCH: 27 pg (ref 26.0–34.0)
MCHC: 32.3 g/dL (ref 30.0–36.0)
MCV: 83.8 fL (ref 80.0–100.0)
Platelets: 186 10*3/uL (ref 150–400)
RBC: 2.96 MIL/uL — ABNORMAL LOW (ref 4.22–5.81)
RDW: 13.4 % (ref 11.5–15.5)
WBC: 4.8 10*3/uL (ref 4.0–10.5)
nRBC: 0 % (ref 0.0–0.2)

## 2020-06-19 LAB — IRON AND TIBC
Iron: 66 ug/dL (ref 45–182)
Saturation Ratios: 17 % — ABNORMAL LOW (ref 17.9–39.5)
TIBC: 385 ug/dL (ref 250–450)
UIBC: 319 ug/dL

## 2020-06-19 LAB — PROTEIN / CREATININE RATIO, URINE
Creatinine, Urine: 123.75 mg/dL
Protein Creatinine Ratio: 0.06 mg/mg{Cre} (ref 0.00–0.15)
Total Protein, Urine: 7 mg/dL

## 2020-06-19 LAB — POCT HEMOGLOBIN-HEMACUE: Hemoglobin: 8 g/dL — ABNORMAL LOW (ref 13.0–17.0)

## 2020-06-19 MED ORDER — EPOETIN ALFA-EPBX 3000 UNIT/ML IJ SOLN
3000.0000 [IU] | Freq: Once | INTRAMUSCULAR | Status: AC
  Administered 2020-06-19: 3000 [IU] via SUBCUTANEOUS
  Filled 2020-06-19: qty 1

## 2020-06-20 DIAGNOSIS — I509 Heart failure, unspecified: Secondary | ICD-10-CM | POA: Diagnosis not present

## 2020-06-20 DIAGNOSIS — J449 Chronic obstructive pulmonary disease, unspecified: Secondary | ICD-10-CM | POA: Diagnosis not present

## 2020-06-23 DIAGNOSIS — R69 Illness, unspecified: Secondary | ICD-10-CM | POA: Diagnosis not present

## 2020-06-24 ENCOUNTER — Encounter: Payer: Self-pay | Admitting: Primary Care

## 2020-06-24 ENCOUNTER — Ambulatory Visit: Payer: Medicare HMO | Admitting: Primary Care

## 2020-06-24 ENCOUNTER — Other Ambulatory Visit: Payer: Self-pay

## 2020-06-24 VITALS — BP 155/90 | HR 75 | Temp 97.7°F | Ht 69.0 in | Wt 217.0 lb

## 2020-06-24 DIAGNOSIS — G4733 Obstructive sleep apnea (adult) (pediatric): Secondary | ICD-10-CM | POA: Diagnosis not present

## 2020-06-24 DIAGNOSIS — J449 Chronic obstructive pulmonary disease, unspecified: Secondary | ICD-10-CM | POA: Insufficient documentation

## 2020-06-24 DIAGNOSIS — M25431 Effusion, right wrist: Secondary | ICD-10-CM | POA: Diagnosis not present

## 2020-06-24 LAB — URIC ACID: Uric Acid, Serum: 7.1 mg/dL (ref 4.0–7.8)

## 2020-06-24 NOTE — Patient Instructions (Addendum)
Orders: - Labs today (uric acid level- if elevated you will need to contact your PCP) - Cpap auto titrate 5-20cm h20 with 2L oxygen, mask of choice, supplies, heated humidification, enroll in airview   Recommendations: - Starting CPAP, aim to wear every night for 4-6 hours or more - Do not drive if experiencing excessive daytime fatigue or somnolence   Follow-up: - Needs PFTs scheduled  - Patient has an Apt with Dr. Halford Chessman on September 14th    CPAP and BPAP Information CPAP and BPAP are methods of helping a person breathe with the use of air pressure. CPAP stands for "continuous positive airway pressure." BPAP stands for "bi-level positive airway pressure." In both methods, air is blown through your nose or mouth and into your air passages to help you breathe well. CPAP and BPAP use different amounts of pressure to blow air. With CPAP, the amount of pressure stays the same while you breathe in and out. With BPAP, the amount of pressure is increased when you breathe in (inhale) so that you can take larger breaths. Your health care provider will recommend whether CPAP or BPAP would be more helpful for you. Why are CPAP and BPAP treatments used? CPAP or BPAP can be helpful if you have:  Sleep apnea.  Chronic obstructive pulmonary disease (COPD).  Heart failure.  Medical conditions that weaken the muscles of the chest including muscular dystrophy, or neurological diseases such as amyotrophic lateral sclerosis (ALS).  Other problems that cause breathing to be weak, abnormal, or difficult. CPAP is most commonly used for obstructive sleep apnea (OSA) to keep the airways from collapsing when the muscles relax during sleep. How is CPAP or BPAP administered? Both CPAP and BPAP are provided by a small machine with a flexible plastic tube that attaches to a plastic mask. You wear the mask. Air is blown through the mask into your nose or mouth. The amount of pressure that is used to blow the air can  be adjusted on the machine. Your health care provider will determine the pressure setting that should be used based on your individual needs. When should CPAP or BPAP be used? In most cases, the mask only needs to be worn during sleep. Generally, the mask needs to be worn throughout the night and during any daytime naps. People with certain medical conditions may also need to wear the mask at other times when they are awake. Follow instructions from your health care provider about when to use the machine. What are some tips for using the mask?   Because the mask needs to be snug, some people feel trapped or closed-in (claustrophobic) when first using the mask. If you feel this way, you may need to get used to the mask. One way to do this is by holding the mask loosely over your nose or mouth and then gradually applying the mask more snugly. You can also gradually increase the amount of time that you use the mask.  Masks are available in various types and sizes. Some fit over your mouth and nose while others fit over just your nose. If your mask does not fit well, talk with your health care provider about getting a different one.  If you are using a mask that fits over your nose and you tend to breathe through your mouth, a chin strap may be applied to help keep your mouth closed.  The CPAP and BPAP machines have alarms that may sound if the mask comes off or develops  a leak.  If you have trouble with the mask, it is very important that you talk with your health care provider about finding a way to make the mask easier to tolerate. Do not stop using the mask. Stopping the use of the mask could have a negative impact on your health. What are some tips for using the machine?  Place your CPAP or BPAP machine on a secure table or stand near an electrical outlet.  Know where the on/off switch is located on the machine.  Follow instructions from your health care provider about how to set the pressure  on your machine and when you should use it.  Do not eat or drink while the CPAP or BPAP machine is on. Food or fluids could get pushed into your lungs by the pressure of the CPAP or BPAP.  Do not smoke. Tobacco smoke residue can damage the machine.  For home use, CPAP and BPAP machines can be rented or purchased through home health care companies. Many different brands of machines are available. Renting a machine before purchasing may help you find out which particular machine works well for you.  Keep the CPAP or BPAP machine and attachments clean. Ask your health care provider for specific instructions. Get help right away if:  You have redness or open areas around your nose or mouth where the mask fits.  You have trouble using the CPAP or BPAP machine.  You cannot tolerate wearing the CPAP or BPAP mask.  You have pain, discomfort, and bloating in your abdomen. Summary  CPAP and BPAP are methods of helping a person breathe with the use of air pressure.  Both CPAP and BPAP are provided by a small machine with a flexible plastic tube that attaches to a plastic mask.  If you have trouble with the mask, it is very important that you talk with your health care provider about finding a way to make the mask easier to tolerate. This information is not intended to replace advice given to you by your health care provider. Make sure you discuss any questions you have with your health care provider. Document Revised: 02/06/2019 Document Reviewed: 09/05/2016 Elsevier Patient Education  [place].

## 2020-06-24 NOTE — Progress Notes (Signed)
@Patient  ID: Andrew Adams, male    DOB: Nov 15, 1945, 74 y.o.   MRN: [id number]  Chief Complaint  Patient presents with  . Follow-up    Patient is on 2 liters oxygen all the time. Patient feels the same as last visit. Has been wheezing. Denies cough    Referring provider: Lavella Lemons, PA  HPI: 74 year old male, former smoker. PMH significant for COPD mixed type, chronic respiratory failure, HTN, GERD, CKD. Patient of Dr. Halford Chessman, last seen on 05/08/20.  06/24/2020 Patient presents today for regular office visits. Accompanied by his wife. He is doing well, no acute complaints. He recently had split night sleep study which showed moderate obstructive sleep apnea. SpO2 low in the 73% on oxygen. He reports intermittent nocturnal wheezing. He remains on 2L oxygen. He has not had Pulmonary function testing yet that was ordered during last visit. He has acute symptoms of right wrist welling which started yesterday. He has hx gout and is on allopurinol.   Sleep study: 05/31/20 PSG - AHI 29.4, SpO2 low 73%. Used 2L oxygen.   No Known Allergies  Immunization History  Administered Date(s) Administered  . Moderna SARS-COVID-2 Vaccination 11/12/2019, 12/13/2019    Past Medical History:  Diagnosis Date  . Anemia   . Borderline diabetic   . GERD (gastroesophageal reflux disease)   . Gout   . Gouty arthritis   . H/O small bowel obstruction   . History of back surgery   . Hyperlipidemia   . Hypertension   . PE (pulmonary embolism)     Tobacco History: Social History   Tobacco Use  Smoking Status Former Smoker  . Packs/day: 1.00  . Years: 38.00  . Pack years: 38.00  . Types: Cigarettes  . Quit date: 02/07/1995  . Years since quitting: 25.3  Smokeless Tobacco Never Used   Counseling given: Not Answered   Outpatient Medications Prior to Visit  Medication Sig Dispense Refill  . allopurinol (ZYLOPRIM) 100 MG tablet Take 1 tablet (100 mg total) by mouth daily. 90 tablet 1  .  amLODipine (NORVASC) 10 MG tablet Take 5 mg by mouth daily.     Marland Kitchen atorvastatin (LIPITOR) 40 MG tablet Take 1 tablet (40 mg total) by mouth daily. 90 tablet 1  . epoetin alfa-epbx (RETACRIT) 3000 UNIT/ML injection 3,000 Units every 14 (fourteen) days.    . ferrous sulfate (FERROUSUL) 325 (65 FE) MG tablet Take 1 tablet (325 mg total) by mouth daily with breakfast. 90 tablet 1  . furosemide (LASIX) 40 MG tablet Take 40 mg by mouth.    Marland Kitchen ipratropium-albuterol (DUONEB) 0.5-2.5 (3) MG/3ML SOLN Take 3 mLs by nebulization 3 (three) times daily. 360 mL 0  . losartan (COZAAR) 50 MG tablet One twice daily 60 tablet 2  . metoprolol (LOPRESSOR) 50 MG tablet Take 1 tablet (50 mg total) by mouth 2 (two) times daily. 180 tablet 1  . Multiple Vitamin (MULTIVITAMIN WITH MINERALS) TABS tablet Take 1 tablet by mouth daily.    . pantoprazole (PROTONIX) 40 MG tablet Take 1 tablet (40 mg total) by mouth daily. 90 tablet 1  . thiamine 250 MG tablet Take 250 mg by mouth daily.    . TRULICITY 7.82 NF/6.2ZH SOPN SMARTSIG:0.5 Milliliter(s) SUB-Q Once a Week    . fenofibrate (TRICOR) 145 MG tablet Take 145 mg by mouth daily.      No facility-administered medications prior to visit.      Review of Systems  Review of Systems  Constitutional: Negative.   Respiratory: Positive for wheezing. Negative for cough and shortness of breath.   Cardiovascular: Negative.   Musculoskeletal:       Right wrist swelling     Physical Exam  BP (!) 155/90   Pulse 75   Temp 97.7 F (36.5 C) (Temporal)   Ht 5\' 9"  (1.753 m)   Wt 217 lb (98.4 kg)   SpO2 98%   BMI 32.05 kg/m  Physical Exam Constitutional:      Appearance: Normal appearance.  HENT:     Head: Normocephalic and atraumatic.  Cardiovascular:     Rate and Rhythm: Normal rate and regular rhythm.  Pulmonary:     Effort: Pulmonary effort is normal.     Breath sounds: Normal breath sounds.  Musculoskeletal:        General: Swelling present.     Comments: Right  wrist swollen   Neurological:     General: No focal deficit present.     Mental Status: He is alert and oriented to person, place, and time. Mental status is at baseline.  Psychiatric:        Mood and Affect: Mood normal.        Behavior: Behavior normal.        Thought Content: Thought content normal.        Judgment: Judgment normal.      Lab Results:  CBC    Component Value Date/Time   WBC 4.8 06/19/2020 1102   RBC 2.96 (L) 06/19/2020 1102   HGB 8.0 (L) 06/19/2020 1114   HCT 24.8 (L) 06/19/2020 1102   PLT 186 06/19/2020 1102   MCV 83.8 06/19/2020 1102   MCV 84.2 04/12/2013 1501   MCH 27.0 06/19/2020 1102   MCHC 32.3 06/19/2020 1102   RDW 13.4 06/19/2020 1102   LYMPHSABS 0.6 (L) 02/11/2020 0557   MONOABS 0.4 02/11/2020 0557   EOSABS 0.0 02/11/2020 0557   BASOSABS 0.0 02/11/2020 0557    BMET    Component Value Date/Time   NA 138 06/19/2020 1102   NA 139 02/24/2014 1030   K 3.6 06/19/2020 1102   CL 97 (L) 06/19/2020 1102   CO2 30 06/19/2020 1102   GLUCOSE 131 (H) 06/19/2020 1102   BUN 24 (H) 06/19/2020 1102   BUN 10 02/24/2014 1030   CREATININE 2.37 (H) 06/19/2020 1102   CREATININE 1.66 (H) 05/07/2013 0942   CALCIUM 8.9 06/19/2020 1102   GFRNONAA 26 (L) 06/19/2020 1102   GFRNONAA 41 (L) 05/07/2013 0942   GFRAA 30 (L) 06/19/2020 1102   GFRAA 48 (L) 05/07/2013 0942    BNP    Component Value Date/Time   BNP 537.0 (H) 02/10/2020 1243    ProBNP No results found for: PROBNP  Imaging: No results found.   Assessment & Plan:   Moderate obstructive sleep apnea - 05/31/20 PSG - AHI 29.4, SpO2 low 73%. Used 2L oxygen.  - Discussed risk factors of untreated sleep apnea and treatment options, patient agreeing to start CPAP therapy - Place DME order for new Cpap auto titrate 5-20cm h20 with 2L oxygen, mask of choice, supplies, heated humidification, enroll in airview  - We will likely need to check ONO at next visit  - Advised patient not to drive if  experiencing excessive daytime fatigue or somnolence    COPD (chronic obstructive pulmonary disease) (HCC) - Needs to schedule PFTs - Continue prn duonebs   Swelling of right wrist - Hx gout, on allopurinol - Checking uric acid level-  if elevated patient to contact PCP for management    Martyn Ehrich, NP 06/24/2020

## 2020-06-24 NOTE — Assessment & Plan Note (Signed)
-   Needs to schedule PFTs - Continue prn duonebs

## 2020-06-24 NOTE — Assessment & Plan Note (Signed)
-   Hx gout, on allopurinol - Checking uric acid level- if elevated patient to contact PCP for management

## 2020-06-24 NOTE — Progress Notes (Signed)
Please let patient know his uric acid level was normal. Recommend elevating right wrist, ice and compression. If swelling not better follow up with PCP

## 2020-06-24 NOTE — Assessment & Plan Note (Addendum)
-   05/31/20 PSG - AHI 29.4, SpO2 low 73%. Used 2L oxygen.  - Discussed risk factors of untreated sleep apnea and treatment options, patient agreeing to start CPAP therapy - Place DME order for new Cpap auto titrate 5-20cm h20 with 2L oxygen, mask of choice, supplies, heated humidification, enroll in airview  - We will likely need to check ONO at next visit  - Advised patient not to drive if experiencing excessive daytime fatigue or somnolence

## 2020-06-25 NOTE — Progress Notes (Signed)
Reviewed and agree with assessment/plan.   Chesley Mires, MD [facility] 06/25/2020, 8:54 AM Pager:  [phone]

## 2020-06-26 ENCOUNTER — Encounter (HOSPITAL_COMMUNITY): Payer: Self-pay

## 2020-06-26 ENCOUNTER — Other Ambulatory Visit: Payer: Self-pay

## 2020-06-26 ENCOUNTER — Encounter (HOSPITAL_COMMUNITY)
Admission: RE | Admit: 2020-06-26 | Discharge: 2020-06-26 | Disposition: A | Discharge status: Home / Self Care | Payer: Medicare HMO | Source: Ambulatory Visit | Attending: Nephrology | Admitting: Nephrology

## 2020-06-26 DIAGNOSIS — M25531 Pain in right wrist: Secondary | ICD-10-CM | POA: Diagnosis not present

## 2020-06-26 DIAGNOSIS — N184 Chronic kidney disease, stage 4 (severe): Secondary | ICD-10-CM | POA: Diagnosis not present

## 2020-06-26 DIAGNOSIS — Z6833 Body mass index (BMI) 33.0-33.9, adult: Secondary | ICD-10-CM | POA: Diagnosis not present

## 2020-06-26 MED ORDER — SODIUM CHLORIDE 0.9 % IV SOLN
510.0000 mg | Freq: Once | INTRAVENOUS | Status: AC
  Administered 2020-06-26: 510 mg via INTRAVENOUS
  Filled 2020-06-26: qty 17

## 2020-06-26 MED ORDER — SODIUM CHLORIDE 0.9 % IV SOLN: Freq: Once | INTRAVENOUS | Status: AC

## 2020-06-30 ENCOUNTER — Telehealth (INDEPENDENT_AMBULATORY_CARE_PROVIDER_SITE_OTHER): Payer: Self-pay | Admitting: Internal Medicine

## 2020-06-30 DIAGNOSIS — E7849 Other hyperlipidemia: Secondary | ICD-10-CM | POA: Diagnosis not present

## 2020-06-30 DIAGNOSIS — E1122 Type 2 diabetes mellitus with diabetic chronic kidney disease: Secondary | ICD-10-CM | POA: Diagnosis not present

## 2020-06-30 DIAGNOSIS — I129 Hypertensive chronic kidney disease with stage 1 through stage 4 chronic kidney disease, or unspecified chronic kidney disease: Secondary | ICD-10-CM | POA: Diagnosis not present

## 2020-06-30 DIAGNOSIS — N184 Chronic kidney disease, stage 4 (severe): Secondary | ICD-10-CM | POA: Diagnosis not present

## 2020-06-30 NOTE — Telephone Encounter (Signed)
Wife called had questions about lab work being done in September - please advise - [phone]

## 2020-07-01 ENCOUNTER — Other Ambulatory Visit: Payer: Self-pay | Admitting: Internal Medicine

## 2020-07-03 ENCOUNTER — Other Ambulatory Visit (HOSPITAL_COMMUNITY)
Admission: RE | Admit: 2020-07-03 | Discharge: 2020-07-03 | Disposition: A | Discharge status: Home / Self Care | Payer: Medicare HMO | Source: Ambulatory Visit | Attending: Pulmonary Disease | Admitting: Pulmonary Disease

## 2020-07-03 ENCOUNTER — Encounter (HOSPITAL_COMMUNITY)
Admission: RE | Admit: 2020-07-03 | Discharge: 2020-07-03 | Disposition: A | Discharge status: Home / Self Care | Payer: Medicare HMO | Source: Ambulatory Visit | Attending: Nephrology | Admitting: Nephrology

## 2020-07-03 ENCOUNTER — Other Ambulatory Visit: Payer: Self-pay

## 2020-07-03 ENCOUNTER — Encounter (HOSPITAL_COMMUNITY): Payer: Self-pay

## 2020-07-03 ENCOUNTER — Encounter (HOSPITAL_COMMUNITY)
Admission: RE | Admit: 2020-07-03 | Discharge: 2020-07-03 | Disposition: A | Discharge status: Home / Self Care | Payer: Medicare HMO | Attending: Nephrology | Admitting: Nephrology

## 2020-07-03 DIAGNOSIS — Z01812 Encounter for preprocedural laboratory examination: Secondary | ICD-10-CM | POA: Diagnosis not present

## 2020-07-03 DIAGNOSIS — D631 Anemia in chronic kidney disease: Secondary | ICD-10-CM | POA: Insufficient documentation

## 2020-07-03 DIAGNOSIS — N184 Chronic kidney disease, stage 4 (severe): Secondary | ICD-10-CM | POA: Diagnosis not present

## 2020-07-03 DIAGNOSIS — Z20822 Contact with and (suspected) exposure to covid-19: Secondary | ICD-10-CM | POA: Insufficient documentation

## 2020-07-03 LAB — POCT HEMOGLOBIN-HEMACUE: Hemoglobin: 8.4 g/dL — ABNORMAL LOW (ref 13.0–17.0)

## 2020-07-03 MED ORDER — EPOETIN ALFA 3000 UNIT/ML IJ SOLN
INTRAMUSCULAR | Status: AC
  Filled 2020-07-03: qty 1

## 2020-07-03 MED ORDER — SODIUM CHLORIDE 0.9 % IV SOLN
510.0000 mg | Freq: Once | INTRAVENOUS | Status: AC
  Administered 2020-07-03: 510 mg via INTRAVENOUS
  Filled 2020-07-03: qty 17

## 2020-07-03 MED ORDER — SODIUM CHLORIDE 0.9 % IV SOLN: Freq: Once | INTRAVENOUS | Status: AC

## 2020-07-03 MED ORDER — EPOETIN ALFA-EPBX 3000 UNIT/ML IJ SOLN
3000.0000 [IU] | Freq: Once | INTRAMUSCULAR | Status: AC
  Administered 2020-07-03: 3000 [IU] via SUBCUTANEOUS
  Filled 2020-07-03: qty 1

## 2020-07-04 LAB — SARS CORONAVIRUS 2 (TAT 6-24 HRS): SARS Coronavirus 2: NEGATIVE

## 2020-07-07 ENCOUNTER — Other Ambulatory Visit: Payer: Self-pay

## 2020-07-07 ENCOUNTER — Ambulatory Visit (HOSPITAL_COMMUNITY)
Admission: RE | Admit: 2020-07-07 | Discharge: 2020-07-07 | Disposition: A | Discharge status: Home / Self Care | Payer: Medicare HMO | Source: Ambulatory Visit | Attending: Pulmonary Disease | Admitting: Pulmonary Disease

## 2020-07-07 DIAGNOSIS — J449 Chronic obstructive pulmonary disease, unspecified: Secondary | ICD-10-CM | POA: Diagnosis present

## 2020-07-07 LAB — PULMONARY FUNCTION TEST
DL/VA % pred: 58 %
DL/VA: 2.34 ml/min/mmHg/L
DLCO unc % pred: 39 %
DLCO unc: 9.61 ml/min/mmHg
FEF 25-75 Post: 2.39 L/sec
FEF 25-75 Pre: 1.5 L/sec
FEF2575-%Change-Post: 59 %
FEF2575-%Pred-Post: 111 %
FEF2575-%Pred-Pre: 70 %
FEV1-%Change-Post: 11 %
FEV1-%Pred-Post: 68 %
FEV1-%Pred-Pre: 61 %
FEV1-Post: 1.81 L
FEV1-Pre: 1.62 L
FEV1FVC-%Change-Post: -3 %
FEV1FVC-%Pred-Pre: 105 %
FEV6-%Change-Post: 14 %
FEV6-%Pred-Post: 68 %
FEV6-%Pred-Pre: 59 %
FEV6-Post: 2.33 L
FEV6-Pre: 2.03 L
FEV6FVC-%Change-Post: 0 %
FEV6FVC-%Pred-Post: 104 %
FEV6FVC-%Pred-Pre: 105 %
FVC-%Change-Post: 15 %
FVC-%Pred-Post: 65 %
FVC-%Pred-Pre: 57 %
FVC-Post: 2.36 L
FVC-Pre: 2.04 L
Post FEV1/FVC ratio: 77 %
Post FEV6/FVC ratio: 99 %
Pre FEV1/FVC ratio: 79 %
Pre FEV6/FVC Ratio: 100 %
RV % pred: 98 %
RV: 2.48 L
TLC % pred: 67 %
TLC: 4.62 L

## 2020-07-07 MED ORDER — ALBUTEROL SULFATE (2.5 MG/3ML) 0.083% IN NEBU
2.5000 mg | INHALATION_SOLUTION | Freq: Once | RESPIRATORY_TRACT | Status: AC
  Administered 2020-07-07: 2.5 mg via RESPIRATORY_TRACT

## 2020-07-08 ENCOUNTER — Other Ambulatory Visit: Payer: Self-pay | Admitting: Internal Medicine

## 2020-07-10 IMAGING — DX DG CHEST 1V PORT
1 series · 1 of 1 positions shown · non-contrast
Comparison: 02/03/2020

CLINICAL DATA: Shortness of breath.

EXAM:
PORTABLE CHEST 1 VIEW

[chest ap]
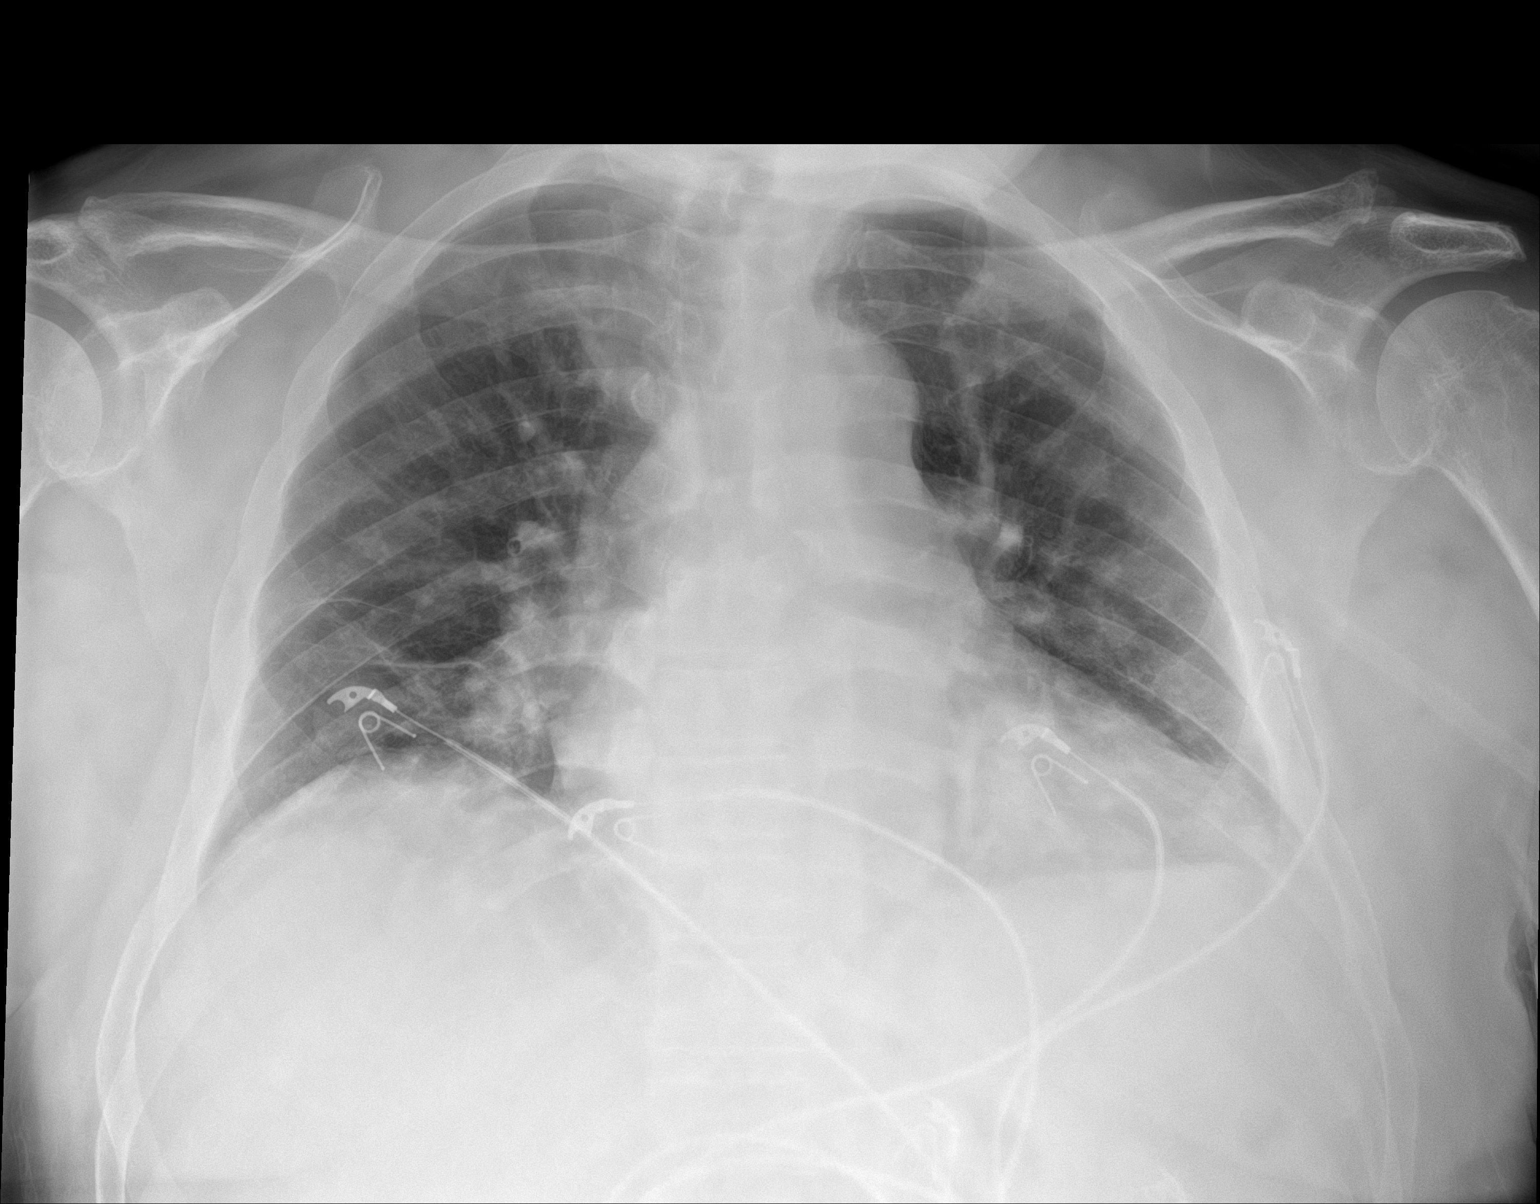

[1 of 1 positions shown; findings below may reference images not displayed]

FINDINGS: The cardiac silhouette is borderline enlarged. Lung volumes are
lower than on the prior study. Mild peribronchial thickening is
mildly more prominent than on the prior study, and there are
scattered ill-defined opacities bilaterally. A small left pleural
effusion is questioned. No pneumothorax is identified. No acute
osseous abnormality is seen.
IMPRESSION: Mild peribronchial thickening with scattered ill-defined opacities
bilaterally which may reflect infection (including atypical and
viral etiologies).

## 2020-07-14 ENCOUNTER — Encounter: Payer: Self-pay | Admitting: Pulmonary Disease

## 2020-07-14 ENCOUNTER — Ambulatory Visit: Payer: Medicare HMO | Admitting: Pulmonary Disease

## 2020-07-14 ENCOUNTER — Other Ambulatory Visit: Payer: Self-pay

## 2020-07-14 VITALS — BP 124/74 | HR 76 | Temp 97.6°F | Ht 69.0 in | Wt 215.0 lb

## 2020-07-14 DIAGNOSIS — J449 Chronic obstructive pulmonary disease, unspecified: Secondary | ICD-10-CM | POA: Diagnosis not present

## 2020-07-14 DIAGNOSIS — G4733 Obstructive sleep apnea (adult) (pediatric): Secondary | ICD-10-CM | POA: Diagnosis not present

## 2020-07-14 DIAGNOSIS — J9611 Chronic respiratory failure with hypoxia: Secondary | ICD-10-CM | POA: Diagnosis not present

## 2020-07-14 NOTE — Patient Instructions (Signed)
Can use duoneb in nebulizer every 4 to 6 hours as needed for cough, wheezing, chest congestion or shortness of breath  Call if you have trouble getting your CPAP set up  Follow up in 3 months

## 2020-07-14 NOTE — Progress Notes (Signed)
[facility]  Chief Complaint  Patient presents with  . Follow-up    No complaints currently    Constitutional:  BP 124/74 (BP Location: Left Arm, Cuff Size: Normal)   Pulse 76   Temp 97.6 F (36.4 C) (Other (Comment)) Comment (Src): wrist  Ht 5\' 9"  (1.753 m)   Wt 215 lb (97.5 kg)   SpO2 100% Comment: 2L O2  BMI 31.75 kg/m   Past Medical History:  Diastolic CHF, ETOH, HTN, HLD, PE Sept 2013, Gout, GERD, CKD 3b, PNA  Past Surgical History:  His  has a past surgical history that includes Abdominal surgery (2103); Abdominal surgery (1981); Cataract extraction w/PHACO (Right, 01/06/2020); Cataract extraction w/PHACO (Left, 01/20/2020); Hernia repair; and back.  Brief Summary:  Andrew Adams is a 74 y.o. male former smoker with COPD, chronic respiratory failure and obstructive sleep apnea.      Subjective:  PFT showed small airways obstruction, mild restriction and severe diffusion defect.  Sleep study showed moderate to severe obstructive sleep apnea.  He is using duoneb tid because that is what he was told to do.  He doesn't feel like he needs it this often.  No having much cough, sputum, or chest congestion.  Not having wheeze or chest pain.  Still using 2 liters 24/7.  Hasn't gotten CPAP set up yet.  Got call from DME and told he has to go to [place] office to get machine.  He was hoping they would come to his house to set up.  Physical Exam:   Appearance - well kempt, wearing oxygen  ENMT - no sinus tenderness, no oral exudate, no LAN, Mallampati 3 airway, no stridor  Respiratory - equal breath sounds bilaterally, no wheezing or rales  CV - s1s2 regular rate and rhythm, no murmurs  Ext - no clubbing, no edema  Skin - no rashes  Psych - normal mood and affect   Pulmonary testing:   ABG on 32% FiO2 02/13/20 >>  PH 7.347, PCO2 65.8, PO2 74.4  PFT 07/07/20 >> FEV1 1.81 (68%), FEV1% 77, TLC 4.62 (67%), DLCO 39%,  +BD  Sleep Tests:   PSG 05/31/20 >> AHI 29.4, SpO2 low 73%.  Used 2 liters oxygen.  Cardiac Tests:   Echo 10/30/19 >> EF 55 to 60%, grade 1 DD, mod LA dilation, mild MR  Social History:  He  reports that he quit smoking about 25 years ago. His smoking use included cigarettes. He has a 38.00 pack-year smoking history. He has never used smokeless tobacco. He reports current alcohol use. He reports that he does not use drugs.  Family History:  His family history includes Diabetes in his mother, sister, and sister; Hypertension in his father, mother, and sister; Ulcers in his father.     Assessment/Plan:   COPD mixed type. - reviewed PFT with him - advised him he can use duoneb prn; depending on how he does will determine if he needs maintenance inhaler regimen  Obstructive sleep apnea. - reviewed his sleep study with him - he is to get auto CPAP set up - will get download and then determine if he needs in lab titration study  Chronic respiratory failure with hypoxia and hypercapnia. - uses [employer] for his DME - continue 2 liters oxygen 24/7  Time Spent Involved in Patient Care on Day of Examination:  23 minutes  Follow up:  Patient Instructions  Can use duoneb in nebulizer every 4 to 6 hours as  needed for cough, wheezing, chest congestion or shortness of breath  Call if you have trouble getting your CPAP set up  Follow up in 3 months   Medication List:   Allergies as of 07/14/2020   No Known Allergies     Medication List       Accurate as of July 14, 2020 12:25 PM. If you have any questions, ask your nurse or doctor.        allopurinol 100 MG tablet Commonly known as: ZYLOPRIM Take 1 tablet (100 mg total) by mouth daily.   amLODipine 10 MG tablet Commonly known as: NORVASC Take 5 mg by mouth daily.   atorvastatin 40 MG tablet Commonly known as: LIPITOR Take 1 tablet (40 mg total) by mouth daily.   ferrous sulfate 325 (65 FE) MG tablet Commonly  known as: FerrouSul Take 1 tablet (325 mg total) by mouth daily with breakfast.   furosemide 40 MG tablet Commonly known as: LASIX Take 40 mg by mouth.   ipratropium-albuterol 0.5-2.5 (3) MG/3ML Soln Commonly known as: DUONEB Take 3 mLs by nebulization 3 (three) times daily. What changed:   when to take this  reasons to take this   losartan 50 MG tablet Commonly known as: COZAAR TAKE ONE TABLET BY MOUTH TWICE DAILY   metoprolol tartrate 50 MG tablet Commonly known as: LOPRESSOR Take 1 tablet (50 mg total) by mouth 2 (two) times daily.   multivitamin with minerals Tabs tablet Take 1 tablet by mouth daily.   pantoprazole 40 MG tablet Commonly known as: PROTONIX Take 1 tablet (40 mg total) by mouth daily.   Retacrit 3000 UNIT/ML injection Generic drug: epoetin alfa-epbx 3,000 Units every 14 (fourteen) days.   thiamine 250 MG tablet Take 250 mg by mouth daily.   Trulicity 4.23 NT/6.1WE Sopn Generic drug: Dulaglutide SMARTSIG:0.5 Milliliter(s) SUB-Q Once a Week       Signature:  Chesley Mires, MD [place] Pager - [phone] 07/14/2020, 12:25 PM

## 2020-07-17 ENCOUNTER — Encounter (HOSPITAL_COMMUNITY)
Admission: RE | Admit: 2020-07-17 | Discharge: 2020-07-17 | Disposition: A | Discharge status: Home / Self Care | Payer: Medicare HMO | Source: Ambulatory Visit | Attending: Nephrology | Admitting: Nephrology

## 2020-07-17 ENCOUNTER — Encounter (HOSPITAL_COMMUNITY): Payer: Self-pay

## 2020-07-17 ENCOUNTER — Other Ambulatory Visit: Payer: Self-pay

## 2020-07-17 DIAGNOSIS — N184 Chronic kidney disease, stage 4 (severe): Secondary | ICD-10-CM | POA: Diagnosis not present

## 2020-07-17 LAB — POCT HEMOGLOBIN-HEMACUE: Hemoglobin: 9.2 g/dL — ABNORMAL LOW (ref 13.0–17.0)

## 2020-07-17 MED ORDER — EPOETIN ALFA-EPBX 3000 UNIT/ML IJ SOLN
INTRAMUSCULAR | Status: AC
  Filled 2020-07-17: qty 1

## 2020-07-17 MED ORDER — EPOETIN ALFA-EPBX 3000 UNIT/ML IJ SOLN
3000.0000 [IU] | Freq: Once | INTRAMUSCULAR | Status: AC
  Administered 2020-07-17: 3000 [IU] via SUBCUTANEOUS

## 2020-07-21 DIAGNOSIS — M25531 Pain in right wrist: Secondary | ICD-10-CM | POA: Diagnosis not present

## 2020-07-21 DIAGNOSIS — D508 Other iron deficiency anemias: Secondary | ICD-10-CM | POA: Diagnosis not present

## 2020-07-21 DIAGNOSIS — J449 Chronic obstructive pulmonary disease, unspecified: Secondary | ICD-10-CM | POA: Diagnosis not present

## 2020-07-21 DIAGNOSIS — G4736 Sleep related hypoventilation in conditions classified elsewhere: Secondary | ICD-10-CM | POA: Diagnosis not present

## 2020-07-21 DIAGNOSIS — G4733 Obstructive sleep apnea (adult) (pediatric): Secondary | ICD-10-CM | POA: Diagnosis not present

## 2020-07-21 DIAGNOSIS — I509 Heart failure, unspecified: Secondary | ICD-10-CM | POA: Diagnosis not present

## 2020-07-22 DIAGNOSIS — G4733 Obstructive sleep apnea (adult) (pediatric): Secondary | ICD-10-CM | POA: Diagnosis not present

## 2020-07-22 LAB — CBC
HCT: 29.4 % — ABNORMAL LOW (ref 38.5–50.0)
Hemoglobin: 9.4 g/dL — ABNORMAL LOW (ref 13.2–17.1)
MCH: 27.4 pg (ref 27.0–33.0)
MCHC: 32 g/dL (ref 32.0–36.0)
MCV: 85.7 fL (ref 80.0–100.0)
MPV: 11.8 fL (ref 7.5–12.5)
Platelets: 169 10*3/uL (ref 140–400)
RBC: 3.43 10*6/uL — ABNORMAL LOW (ref 4.20–5.80)
RDW: 13.5 % (ref 11.0–15.0)
WBC: 4.2 10*3/uL (ref 3.8–10.8)

## 2020-07-22 LAB — IRON,TIBC AND FERRITIN PANEL
%SAT: 27 % (calc) (ref 20–48)
Ferritin: 180 ng/mL (ref 24–380)
Iron: 75 ug/dL (ref 50–180)
TIBC: 274 mcg/dL (calc) (ref 250–425)

## 2020-07-23 ENCOUNTER — Other Ambulatory Visit (INDEPENDENT_AMBULATORY_CARE_PROVIDER_SITE_OTHER): Payer: Self-pay | Admitting: Gastroenterology

## 2020-07-23 ENCOUNTER — Telehealth (INDEPENDENT_AMBULATORY_CARE_PROVIDER_SITE_OTHER): Payer: Self-pay | Admitting: Gastroenterology

## 2020-07-23 NOTE — Telephone Encounter (Signed)
Spoke with patient regarding a discussion I had with Dr. Halford Chessman, who reported the patient was as optimized as possible from the respiratory standpoint as he could be . He has increased risk during the procedure, for which he recommended to proceed with EGD and colonoscopy in the hospital setting. I explained this to the patient who understood and agreed. Will order EGD and colonoscopy.  Andrew Peppers, MD Gastroenterology and Hepatology [facility]

## 2020-07-23 NOTE — Telephone Encounter (Signed)
Room 3, Dx: iron deficiency anemia

## 2020-07-23 NOTE — Telephone Encounter (Signed)
Room 1 or 3

## 2020-07-23 NOTE — Progress Notes (Signed)
EGD/colonoscopy Room 3, Dx: iron deficiency anemia

## 2020-07-30 DIAGNOSIS — I129 Hypertensive chronic kidney disease with stage 1 through stage 4 chronic kidney disease, or unspecified chronic kidney disease: Secondary | ICD-10-CM | POA: Diagnosis not present

## 2020-07-30 DIAGNOSIS — N184 Chronic kidney disease, stage 4 (severe): Secondary | ICD-10-CM | POA: Diagnosis not present

## 2020-07-30 DIAGNOSIS — E1122 Type 2 diabetes mellitus with diabetic chronic kidney disease: Secondary | ICD-10-CM | POA: Diagnosis not present

## 2020-07-30 DIAGNOSIS — E7849 Other hyperlipidemia: Secondary | ICD-10-CM | POA: Diagnosis not present

## 2020-08-03 ENCOUNTER — Encounter (HOSPITAL_COMMUNITY): Payer: Self-pay

## 2020-08-03 ENCOUNTER — Encounter (HOSPITAL_COMMUNITY)
Admission: RE | Admit: 2020-08-03 | Discharge: 2020-08-03 | Disposition: A | Discharge status: Home / Self Care | Payer: Medicare HMO | Source: Ambulatory Visit | Attending: Nephrology | Admitting: Nephrology

## 2020-08-03 ENCOUNTER — Encounter (INDEPENDENT_AMBULATORY_CARE_PROVIDER_SITE_OTHER): Payer: Self-pay | Admitting: *Deleted

## 2020-08-03 ENCOUNTER — Other Ambulatory Visit: Payer: Self-pay

## 2020-08-03 ENCOUNTER — Telehealth (INDEPENDENT_AMBULATORY_CARE_PROVIDER_SITE_OTHER): Payer: Self-pay | Admitting: *Deleted

## 2020-08-03 DIAGNOSIS — H18413 Arcus senilis, bilateral: Secondary | ICD-10-CM | POA: Diagnosis not present

## 2020-08-03 DIAGNOSIS — N184 Chronic kidney disease, stage 4 (severe): Secondary | ICD-10-CM | POA: Insufficient documentation

## 2020-08-03 DIAGNOSIS — Z961 Presence of intraocular lens: Secondary | ICD-10-CM | POA: Diagnosis not present

## 2020-08-03 DIAGNOSIS — D631 Anemia in chronic kidney disease: Secondary | ICD-10-CM | POA: Diagnosis not present

## 2020-08-03 DIAGNOSIS — E119 Type 2 diabetes mellitus without complications: Secondary | ICD-10-CM | POA: Diagnosis not present

## 2020-08-03 DIAGNOSIS — H353131 Nonexudative age-related macular degeneration, bilateral, early dry stage: Secondary | ICD-10-CM | POA: Diagnosis not present

## 2020-08-03 HISTORY — DX: Chronic kidney disease, unspecified: N18.9

## 2020-08-03 LAB — POCT HEMOGLOBIN-HEMACUE: Hemoglobin: 9.6 g/dL — ABNORMAL LOW (ref 13.0–17.0)

## 2020-08-03 MED ORDER — EPOETIN ALFA-EPBX 3000 UNIT/ML IJ SOLN
3000.0000 [IU] | Freq: Once | INTRAMUSCULAR | Status: AC
  Administered 2020-08-03: 3000 [IU] via SUBCUTANEOUS
  Filled 2020-08-03: qty 1

## 2020-08-03 MED ORDER — PLENVU 140 G PO SOLR: 1.0000 | Freq: Once | ORAL | 0 refills | Status: AC

## 2020-08-03 NOTE — Telephone Encounter (Signed)
Patient needs Plenvu (copay card) ° °

## 2020-08-03 NOTE — Telephone Encounter (Signed)
TCS sch'd 08/28/20, patient aware, instructions mailed

## 2020-08-17 ENCOUNTER — Other Ambulatory Visit: Payer: Self-pay

## 2020-08-17 ENCOUNTER — Encounter (HOSPITAL_COMMUNITY)
Admission: RE | Admit: 2020-08-17 | Discharge: 2020-08-17 | Disposition: A | Discharge status: Home / Self Care | Payer: Medicare HMO | Source: Ambulatory Visit | Attending: Nephrology | Admitting: Nephrology

## 2020-08-17 ENCOUNTER — Encounter (HOSPITAL_COMMUNITY): Payer: Self-pay

## 2020-08-17 DIAGNOSIS — N184 Chronic kidney disease, stage 4 (severe): Secondary | ICD-10-CM | POA: Diagnosis not present

## 2020-08-17 LAB — POCT HEMOGLOBIN-HEMACUE: Hemoglobin: 8.7 g/dL — ABNORMAL LOW (ref 13.0–17.0)

## 2020-08-17 MED ORDER — EPOETIN ALFA-EPBX 3000 UNIT/ML IJ SOLN
INTRAMUSCULAR | Status: AC
  Filled 2020-08-17: qty 1

## 2020-08-17 MED ORDER — EPOETIN ALFA-EPBX 3000 UNIT/ML IJ SOLN
3000.0000 [IU] | Freq: Once | INTRAMUSCULAR | Status: AC
  Administered 2020-08-17: 3000 [IU] via SUBCUTANEOUS

## 2020-08-19 ENCOUNTER — Other Ambulatory Visit (INDEPENDENT_AMBULATORY_CARE_PROVIDER_SITE_OTHER): Payer: Self-pay | Admitting: *Deleted

## 2020-08-19 DIAGNOSIS — D631 Anemia in chronic kidney disease: Secondary | ICD-10-CM | POA: Diagnosis not present

## 2020-08-19 DIAGNOSIS — I5032 Chronic diastolic (congestive) heart failure: Secondary | ICD-10-CM | POA: Diagnosis not present

## 2020-08-19 DIAGNOSIS — D508 Other iron deficiency anemias: Secondary | ICD-10-CM | POA: Diagnosis not present

## 2020-08-19 DIAGNOSIS — N189 Chronic kidney disease, unspecified: Secondary | ICD-10-CM | POA: Diagnosis not present

## 2020-08-20 DIAGNOSIS — I160 Hypertensive urgency: Secondary | ICD-10-CM | POA: Diagnosis not present

## 2020-08-20 DIAGNOSIS — I5032 Chronic diastolic (congestive) heart failure: Secondary | ICD-10-CM | POA: Diagnosis not present

## 2020-08-20 DIAGNOSIS — E1122 Type 2 diabetes mellitus with diabetic chronic kidney disease: Secondary | ICD-10-CM | POA: Diagnosis not present

## 2020-08-20 DIAGNOSIS — G4736 Sleep related hypoventilation in conditions classified elsewhere: Secondary | ICD-10-CM | POA: Diagnosis not present

## 2020-08-20 DIAGNOSIS — N189 Chronic kidney disease, unspecified: Secondary | ICD-10-CM | POA: Diagnosis not present

## 2020-08-20 DIAGNOSIS — I509 Heart failure, unspecified: Secondary | ICD-10-CM | POA: Diagnosis not present

## 2020-08-20 DIAGNOSIS — D508 Other iron deficiency anemias: Secondary | ICD-10-CM | POA: Diagnosis not present

## 2020-08-20 DIAGNOSIS — D631 Anemia in chronic kidney disease: Secondary | ICD-10-CM | POA: Diagnosis not present

## 2020-08-20 DIAGNOSIS — G4733 Obstructive sleep apnea (adult) (pediatric): Secondary | ICD-10-CM | POA: Diagnosis not present

## 2020-08-20 DIAGNOSIS — J449 Chronic obstructive pulmonary disease, unspecified: Secondary | ICD-10-CM | POA: Diagnosis not present

## 2020-08-21 DIAGNOSIS — G4733 Obstructive sleep apnea (adult) (pediatric): Secondary | ICD-10-CM | POA: Diagnosis not present

## 2020-08-24 NOTE — Patient Instructions (Signed)
Andrew Adams  08/24/2020     @PREFPERIOPPHARMACY @   Your procedure is scheduled on  08/28/2020.  Report to [facility] at  1300 (1:00)  P.M.  Call this number if you have problems the morning of surgery:  [phone]   Remember:  Follow the diet and pre instructions given to you by the office.                        Take these medicines the morning of surgery with A SIP OF WATER  Allopurinol, amlodipine, metoprolol, protonix.    Do not wear jewelry, make-up or nail polish.  Do not wear lotions, powders, or perfumes. Please wear deodorant and brush your teeth.  Do not shave 48 hours prior to surgery.  Men may shave face and neck.  Do not bring valuables to the hospital.  [facility] is not responsible for any belongings or valuables.  Contacts, dentures or bridgework may not be worn into surgery.  Leave your suitcase in the car.  After surgery it may be brought to your room.  For patients admitted to the hospital, discharge time will be determined by your treatment team.  Patients discharged the day of surgery will not be allowed to drive home.   Name and phone number of your driver:   family Special instructions:  DO NOT smoke the morning of your procedure.  Please read over the following fact sheets that you were given. Anesthesia Post-op Instructions and Care and Recovery After Surgery       Upper Endoscopy, Adult, Care After This sheet gives you information about how to care for yourself after your procedure. Your health care provider may also give you more specific instructions. If you have problems or questions, contact your health care provider. What can I expect after the procedure? After the procedure, it is common to have:  A sore throat.  Mild stomach pain or discomfort.  Bloating.  Nausea. Follow these instructions at home:   Follow instructions from your health care provider about what to eat or drink after your  procedure.  Return to your normal activities as told by your health care provider. Ask your health care provider what activities are safe for you.  Take over-the-counter and prescription medicines only as told by your health care provider.  Do not drive for 24 hours if you were given a sedative during your procedure.  Keep all follow-up visits as told by your health care provider. This is important. Contact a health care provider if you have:  A sore throat that lasts longer than one day.  Trouble swallowing. Get help right away if:  You vomit blood or your vomit looks like coffee grounds.  You have: ? A fever. ? Bloody, black, or tarry stools. ? A severe sore throat or you cannot swallow. ? Difficulty breathing. ? Severe pain in your chest or abdomen. Summary  After the procedure, it is common to have a sore throat, mild stomach discomfort, bloating, and nausea.  Do not drive for 24 hours if you were given a sedative during the procedure.  Follow instructions from your health care provider about what to eat or drink after your procedure.  Return to your normal activities as told by your health care provider. This information is not intended to replace advice given to you by your health care provider. Make sure you discuss any questions you have  with your health care provider. Document Revised: 04/10/2018 Document Reviewed: 03/19/2018 Elsevier Patient Education  [place].  Colonoscopy, Adult, Care After This sheet gives you information about how to care for yourself after your procedure. Your health care provider may also give you more specific instructions. If you have problems or questions, contact your health care provider. What can I expect after the procedure? After the procedure, it is common to have:  A small amount of blood in your stool for 24 hours after the procedure.  Some gas.  Mild cramping or bloating of your abdomen. Follow these instructions  at home: Eating and drinking   Drink enough fluid to keep your urine pale yellow.  Follow instructions from your health care provider about eating or drinking restrictions.  Resume your normal diet as instructed by your health care provider. Avoid heavy or fried foods that are hard to digest. Activity  Rest as told by your health care provider.  Avoid sitting for a long time without moving. Get up to take short walks every 1-2 hours. This is important to improve blood flow and breathing. Ask for help if you feel weak or unsteady.  Return to your normal activities as told by your health care provider. Ask your health care provider what activities are safe for you. Managing cramping and bloating   Try walking around when you have cramps or feel bloated.  Apply heat to your abdomen as told by your health care provider. Use the heat source that your health care provider recommends, such as a moist heat pack or a heating pad. ? Place a towel between your skin and the heat source. ? Leave the heat on for 20-30 minutes. ? Remove the heat if your skin turns bright red. This is especially important if you are unable to feel pain, heat, or cold. You may have a greater risk of getting burned. General instructions  For the first 24 hours after the procedure: ? Do not drive or use machinery. ? Do not sign important documents. ? Do not drink alcohol. ? Do your regular daily activities at a slower pace than normal. ? Eat soft foods that are easy to digest.  Take over-the-counter and prescription medicines only as told by your health care provider.  Keep all follow-up visits as told by your health care provider. This is important. Contact a health care provider if:  You have blood in your stool 2-3 days after the procedure. Get help right away if you have:  More than a small spotting of blood in your stool.  Large blood clots in your stool.  Swelling of your abdomen.  Nausea or  vomiting.  A fever.  Increasing pain in your abdomen that is not relieved with medicine. Summary  After the procedure, it is common to have a small amount of blood in your stool. You may also have mild cramping and bloating of your abdomen.  For the first 24 hours after the procedure, do not drive or use machinery, sign important documents, or drink alcohol.  Get help right away if you have a lot of blood in your stool, nausea or vomiting, a fever, or increased pain in your abdomen. This information is not intended to replace advice given to you by your health care provider. Make sure you discuss any questions you have with your health care provider. Document Revised: 05/13/2019 Document Reviewed: 05/13/2019 Elsevier Patient Education  [place] After These instructions provide you  with information about caring for yourself after your procedure. Your health care provider may also give you more specific instructions. Your treatment has been planned according to current medical practices, but problems sometimes occur. Call your health care provider if you have any problems or questions after your procedure. What can I expect after the procedure? After your procedure, you may:  Feel sleepy for several hours.  Feel clumsy and have poor balance for several hours.  Feel forgetful about what happened after the procedure.  Have poor judgment for several hours.  Feel nauseous or vomit.  Have a sore throat if you had a breathing tube during the procedure. Follow these instructions at home: For at least 24 hours after the procedure:      Have a responsible adult stay with you. It is important to have someone help care for you until you are awake and alert.  Rest as needed.  Do not: ? Participate in activities in which you could fall or become injured. ? Drive. ? Use heavy machinery. ? Drink alcohol. ? Take sleeping pills or medicines that  cause drowsiness. ? Make important decisions or sign legal documents. ? Take care of children on your own. Eating and drinking  Follow the diet that is recommended by your health care provider.  If you vomit, drink water, juice, or soup when you can drink without vomiting.  Make sure you have little or no nausea before eating solid foods. General instructions  Take over-the-counter and prescription medicines only as told by your health care provider.  If you have sleep apnea, surgery and certain medicines can increase your risk for breathing problems. Follow instructions from your health care provider about wearing your sleep device: ? Anytime you are sleeping, including during daytime naps. ? While taking prescription pain medicines, sleeping medicines, or medicines that make you drowsy.  If you smoke, do not smoke without supervision.  Keep all follow-up visits as told by your health care provider. This is important. Contact a health care provider if:  You keep feeling nauseous or you keep vomiting.  You feel light-headed.  You develop a rash.  You have a fever. Get help right away if:  You have trouble breathing. Summary  For several hours after your procedure, you may feel sleepy and have poor judgment.  Have a responsible adult stay with you for at least 24 hours or until you are awake and alert. This information is not intended to replace advice given to you by your health care provider. Make sure you discuss any questions you have with your health care provider. Document Revised: 01/15/2018 Document Reviewed: 02/07/2016 Elsevier Patient Education  [place].

## 2020-08-26 ENCOUNTER — Other Ambulatory Visit (HOSPITAL_COMMUNITY)
Admission: RE | Admit: 2020-08-26 | Discharge: 2020-08-26 | Disposition: A | Discharge status: Home / Self Care | Payer: Medicare HMO | Source: Ambulatory Visit | Attending: Gastroenterology | Admitting: Gastroenterology

## 2020-08-26 ENCOUNTER — Encounter (HOSPITAL_COMMUNITY)
Admission: RE | Admit: 2020-08-26 | Discharge: 2020-08-26 | Disposition: A | Discharge status: Home / Self Care | Payer: Medicare HMO | Source: Ambulatory Visit | Attending: Gastroenterology | Admitting: Gastroenterology

## 2020-08-26 ENCOUNTER — Other Ambulatory Visit: Payer: Self-pay

## 2020-08-26 ENCOUNTER — Encounter (HOSPITAL_COMMUNITY): Payer: Self-pay

## 2020-08-26 DIAGNOSIS — Z01812 Encounter for preprocedural laboratory examination: Secondary | ICD-10-CM | POA: Diagnosis not present

## 2020-08-26 DIAGNOSIS — Z20822 Contact with and (suspected) exposure to covid-19: Secondary | ICD-10-CM | POA: Insufficient documentation

## 2020-08-26 HISTORY — DX: Sleep apnea, unspecified: G47.30

## 2020-08-26 LAB — CBC WITH DIFFERENTIAL/PLATELET
Abs Immature Granulocytes: 0.01 10*3/uL (ref 0.00–0.07)
Basophils Absolute: 0 10*3/uL (ref 0.0–0.1)
Basophils Relative: 0 %
Eosinophils Absolute: 0.3 10*3/uL (ref 0.0–0.5)
Eosinophils Relative: 6 %
HCT: 27.7 % — ABNORMAL LOW (ref 39.0–52.0)
Hemoglobin: 8.8 g/dL — ABNORMAL LOW (ref 13.0–17.0)
Immature Granulocytes: 0 %
Lymphocytes Relative: 30 %
Lymphs Abs: 1.3 10*3/uL (ref 0.7–4.0)
MCH: 27.2 pg (ref 26.0–34.0)
MCHC: 31.8 g/dL (ref 30.0–36.0)
MCV: 85.5 fL (ref 80.0–100.0)
Monocytes Absolute: 0.4 10*3/uL (ref 0.1–1.0)
Monocytes Relative: 10 %
Neutro Abs: 2.5 10*3/uL (ref 1.7–7.7)
Neutrophils Relative %: 54 %
Platelets: 164 10*3/uL (ref 150–400)
RBC: 3.24 MIL/uL — ABNORMAL LOW (ref 4.22–5.81)
RDW: 13.6 % (ref 11.5–15.5)
WBC: 4.6 10*3/uL (ref 4.0–10.5)
nRBC: 0 % (ref 0.0–0.2)

## 2020-08-26 LAB — BASIC METABOLIC PANEL
Anion gap: 8 (ref 5–15)
BUN: 25 mg/dL — ABNORMAL HIGH (ref 8–23)
CO2: 31 mmol/L (ref 22–32)
Calcium: 8.6 mg/dL — ABNORMAL LOW (ref 8.9–10.3)
Chloride: 101 mmol/L (ref 98–111)
Creatinine, Ser: 2.22 mg/dL — ABNORMAL HIGH (ref 0.61–1.24)
GFR, Estimated: 30 mL/min — ABNORMAL LOW (ref >60)
Glucose, Bld: 186 mg/dL — ABNORMAL HIGH (ref 70–99)
Potassium: 3.4 mmol/L — ABNORMAL LOW (ref 3.5–5.1)
Sodium: 140 mmol/L (ref 135–145)

## 2020-08-26 LAB — SARS CORONAVIRUS 2 (TAT 6-24 HRS): SARS Coronavirus 2: NEGATIVE

## 2020-08-28 ENCOUNTER — Other Ambulatory Visit: Payer: Self-pay

## 2020-08-28 ENCOUNTER — Ambulatory Visit (HOSPITAL_COMMUNITY): Payer: Medicare HMO | Admitting: Anesthesiology

## 2020-08-28 ENCOUNTER — Encounter (HOSPITAL_COMMUNITY)
Admission: RE | Disposition: A | Discharge status: Home / Self Care | Payer: Self-pay | Source: Home / Self Care | Attending: Gastroenterology

## 2020-08-28 ENCOUNTER — Ambulatory Visit (HOSPITAL_COMMUNITY)
Admission: RE | Admit: 2020-08-28 | Discharge: 2020-08-28 | Disposition: A | Discharge status: Home / Self Care | Payer: Medicare HMO | Attending: Gastroenterology | Admitting: Gastroenterology

## 2020-08-28 ENCOUNTER — Encounter (HOSPITAL_COMMUNITY): Payer: Self-pay | Admitting: Gastroenterology

## 2020-08-28 DIAGNOSIS — K635 Polyp of colon: Secondary | ICD-10-CM | POA: Diagnosis not present

## 2020-08-28 DIAGNOSIS — K648 Other hemorrhoids: Secondary | ICD-10-CM | POA: Diagnosis not present

## 2020-08-28 DIAGNOSIS — K5730 Diverticulosis of large intestine without perforation or abscess without bleeding: Secondary | ICD-10-CM | POA: Insufficient documentation

## 2020-08-28 DIAGNOSIS — D509 Iron deficiency anemia, unspecified: Secondary | ICD-10-CM | POA: Insufficient documentation

## 2020-08-28 DIAGNOSIS — K259 Gastric ulcer, unspecified as acute or chronic, without hemorrhage or perforation: Secondary | ICD-10-CM | POA: Diagnosis not present

## 2020-08-28 DIAGNOSIS — D123 Benign neoplasm of transverse colon: Secondary | ICD-10-CM | POA: Insufficient documentation

## 2020-08-28 DIAGNOSIS — K317 Polyp of stomach and duodenum: Secondary | ICD-10-CM | POA: Diagnosis not present

## 2020-08-28 DIAGNOSIS — K449 Diaphragmatic hernia without obstruction or gangrene: Secondary | ICD-10-CM | POA: Insufficient documentation

## 2020-08-28 DIAGNOSIS — Z8379 Family history of other diseases of the digestive system: Secondary | ICD-10-CM | POA: Diagnosis not present

## 2020-08-28 DIAGNOSIS — Z87891 Personal history of nicotine dependence: Secondary | ICD-10-CM | POA: Insufficient documentation

## 2020-08-28 DIAGNOSIS — D132 Benign neoplasm of duodenum: Secondary | ICD-10-CM | POA: Insufficient documentation

## 2020-08-28 DIAGNOSIS — D125 Benign neoplasm of sigmoid colon: Secondary | ICD-10-CM | POA: Diagnosis not present

## 2020-08-28 DIAGNOSIS — D122 Benign neoplasm of ascending colon: Secondary | ICD-10-CM | POA: Diagnosis not present

## 2020-08-28 DIAGNOSIS — D120 Benign neoplasm of cecum: Secondary | ICD-10-CM | POA: Diagnosis not present

## 2020-08-28 DIAGNOSIS — G4730 Sleep apnea, unspecified: Secondary | ICD-10-CM | POA: Diagnosis not present

## 2020-08-28 DIAGNOSIS — J449 Chronic obstructive pulmonary disease, unspecified: Secondary | ICD-10-CM | POA: Diagnosis not present

## 2020-08-28 HISTORY — PX: BIOPSY: SHX5522

## 2020-08-28 HISTORY — PX: COLONOSCOPY WITH PROPOFOL: SHX5780

## 2020-08-28 HISTORY — PX: POLYPECTOMY: SHX5525

## 2020-08-28 HISTORY — PX: ESOPHAGOGASTRODUODENOSCOPY (EGD) WITH PROPOFOL: SHX5813

## 2020-08-28 SURGERY — COLONOSCOPY WITH PROPOFOL
Anesthesia: General

## 2020-08-28 MED ORDER — STERILE WATER FOR IRRIGATION IR SOLN
Status: DC | PRN
  Administered 2020-08-28: 1.5 mL

## 2020-08-28 MED ORDER — LACTATED RINGERS IV SOLN: INTRAVENOUS | Status: DC | PRN

## 2020-08-28 MED ORDER — PROPOFOL 10 MG/ML IV BOLUS
INTRAVENOUS | Status: DC | PRN
  Administered 2020-08-28: 40 mg via INTRAVENOUS

## 2020-08-28 MED ORDER — LACTATED RINGERS IV SOLN: INTRAVENOUS | Status: DC

## 2020-08-28 MED ORDER — PROPOFOL 500 MG/50ML IV EMUL
INTRAVENOUS | Status: DC | PRN
  Administered 2020-08-28: 100 ug/kg/min via INTRAVENOUS

## 2020-08-28 MED ORDER — LIDOCAINE HCL (PF) 1 % IJ SOLN
INTRAMUSCULAR | Status: AC
  Filled 2020-08-28: qty 30

## 2020-08-28 NOTE — Anesthesia Postprocedure Evaluation (Signed)
Anesthesia Post Note  Patient: HARVEST STANCO  Procedure(s) Performed: COLONOSCOPY WITH PROPOFOL (N/A ) ESOPHAGOGASTRODUODENOSCOPY (EGD) WITH PROPOFOL (N/A ) BIOPSY POLYPECTOMY  Patient location during evaluation: PACU Anesthesia Type: General Level of consciousness: awake, oriented, awake and alert and patient cooperative Pain management: satisfactory to patient Vital Signs Assessment: post-procedure vital signs reviewed and stable Respiratory status: spontaneous breathing, respiratory function stable and nonlabored ventilation Cardiovascular status: stable Postop Assessment: no apparent nausea or vomiting Anesthetic complications: no   No complications documented.   Last Vitals:  Vitals:   08/28/20 1327  Pulse: 69  Resp: 16  Temp: 36.7 C  SpO2: 98%    Last Pain:  Vitals:   08/28/20 1451  TempSrc:   PainSc: 0-No pain                 Adie Vilar

## 2020-08-28 NOTE — Op Note (Signed)
[facility] Patient Name: Andrew Adams Procedure Date: 08/28/2020 2:56 PM MRN: [id number] Date of Birth: November 04, 1945 Attending MD: [name] ,  CSN: [id number] Age: 74 Admit Type: Outpatient Procedure:                Upper GI endoscopy Indications:              Iron deficiency anemia Providers:                [name], Caprice Kluver, Crystal Page, Nelma Rothman, Technician Referring MD:              Medicines:                Monitored Anesthesia Care Complications:            No immediate complications. Estimated Blood Loss:     Estimated blood loss: none. Procedure:                Pre-Anesthesia Assessment:                           - Prior to the procedure, a History and Physical                            was performed, and patient medications, allergies                            and sensitivities were reviewed. The patient's                            tolerance of previous anesthesia was reviewed.                           - The risks and benefits of the procedure and the                            sedation options and risks were discussed with the                            patient. All questions were answered and informed                            consent was obtained.                           - ASA Grade Assessment: III - A patient with severe                            systemic disease.                           After obtaining informed consent, the endoscope was                            passed under direct vision. Throughout the  procedure, the patient's blood pressure, pulse, and                            oxygen saturations were monitored continuously. The                            [id number]-H190 ([id number]) scope was introduced through the                            mouth, and advanced to the third part of duodenum.                            The patient tolerated the procedure well. The upper                             GI endoscopy was performed with difficulty due to                            the patient's oxygen desaturation, improved with                            mask bagging. Scope In: 3:03:31 PM Scope Out: 3:29:10 PM Total Procedure Duration: 0 hours 25 minutes 39 seconds  Findings:      A 1 cm hiatal hernia was present.      The exam of the esophagus was otherwise normal.      Multiple 4 to 25 mm sessile polyps with no bleeding were found in the       cardia and in the gastric body. Four of these polyps had an inflammatory       appearance. These polyps were removed with a hot snare. Resection and       retrieval were complete, only one polyp could not be retrieved.      A single 40 mm multilobulated sessile polyp was found in the third       portion of the duodenum. Biopsies were taken with a cold forceps for       histology. Impression:               - 1 cm hiatal hernia.                           - Multiple gastric polyps. Resected and retrieved.                           - A single large duodenal polyp. Biopsied. Moderate Sedation:      Per Anesthesia Care Recommendation:           - Discharge patient to home (ambulatory).                           - Resume previous diet.                           - Await pathology results.                           -  Continue oral iron supplementation.                           - Schedule capsule endoscopy. Procedure Code(s):        --- Professional ---                           [phone], GC, Esophagogastroduodenoscopy, flexible,                            transoral; with removal of tumor(s), polyp(s), or                            other lesion(s) by snare technique                           43239, 25, Esophagogastroduodenoscopy, flexible,                            transoral; with biopsy, single or multiple Diagnosis Code(s):        --- Professional ---                           K44.9, Diaphragmatic hernia without obstruction or                             gangrene                           K31.7, Polyp of stomach and duodenum                           D50.9, Iron deficiency anemia, unspecified CPT copyright 2019 American Medical Association. All rights reserved. The codes documented in this report are preliminary and upon coder review may  be revised to meet current compliance requirements. [name], MD [name],  08/28/2020 4:44:49 PM This report has been signed electronically. Number of Addenda: 0

## 2020-08-28 NOTE — Anesthesia Preprocedure Evaluation (Signed)
Anesthesia Evaluation  Patient identified by MRN, date of birth, ID band Patient awake    Reviewed: Allergy & Precautions, NPO status , Patient's Chart, lab work & pertinent test results, reviewed documented beta blocker date and time   History of Anesthesia Complications Negative for: history of anesthetic complications  Airway Mallampati: III  TM Distance: >3 FB Neck ROM: Full    Dental no notable dental hx.    Pulmonary sleep apnea , COPD,  COPD inhaler, former smoker,    Pulmonary exam normal breath sounds clear to auscultation       Cardiovascular hypertension, Normal cardiovascular exam Rhythm:Regular Rate:Normal     Neuro/Psych    GI/Hepatic GERD  ,  Endo/Other    Renal/GU CRFRenal disease     Musculoskeletal  (+) Arthritis , Osteoarthritis,    Abdominal   Peds  Hematology  (+) anemia ,   Anesthesia Other Findings   Reproductive/Obstetrics                             Anesthesia Physical Anesthesia Plan  ASA: III  Anesthesia Plan: General   Post-op Pain Management:    Induction: Intravenous  PONV Risk Score and Plan:   Airway Management Planned: Nasal Cannula  Additional Equipment:   Intra-op Plan:   Post-operative Plan:   Informed Consent: I have reviewed the patients History and Physical, chart, labs and discussed the procedure including the risks, benefits and alternatives for the proposed anesthesia with the patient or authorized representative who has indicated his/her understanding and acceptance.     Dental advisory given  Plan Discussed with: CRNA  Anesthesia Plan Comments:         Anesthesia Quick Evaluation

## 2020-08-28 NOTE — Transfer of Care (Signed)
Immediate Anesthesia Transfer of Care Note  Patient: Andrew Adams  Procedure(s) Performed: COLONOSCOPY WITH PROPOFOL (N/A ) ESOPHAGOGASTRODUODENOSCOPY (EGD) WITH PROPOFOL (N/A ) BIOPSY POLYPECTOMY  Patient Location: PACU  Anesthesia Type:General  Level of Consciousness: awake, alert , oriented and patient cooperative  Airway & Oxygen Therapy: Patient Spontanous Breathing and Patient connected to nasal cannula oxygen  Post-op Assessment: Report given to RN and Post -op Vital signs reviewed and stable  Post vital signs: Reviewed and stable  Last Vitals:  Vitals Value Taken Time  BP 109/69 08/28/20 1631  Temp    Pulse 73 08/28/20 1633  Resp 23 08/28/20 1633  SpO2 93 % 08/28/20 1633  Vitals shown include unvalidated device data.  Last Pain:  Vitals:   08/28/20 1451  TempSrc:   PainSc: 0-No pain         Complications: No complications documented.

## 2020-08-28 NOTE — Discharge Instructions (Signed)
You are being discharged to home.  Resume your previous diet.  We are waiting for your pathology results.  Continue oral iron supplementation. Schedule capsule endoscopy.   *****Left message for Lacretia Nicks referral coordinator to assist with scheduling ******** Your physician has recommended a repeat colonoscopy for surveillance based on pathology results.         Upper Endoscopy, Adult, Care After This sheet gives you information about how to care for yourself after your procedure. Your health care provider may also give you more specific instructions. If you have problems or questions, contact your health care provider. What can I expect after the procedure? After the procedure, it is common to have:  A sore throat.  Mild stomach pain or discomfort.  Bloating.  Nausea. Follow these instructions at home:   Follow instructions from your health care provider about what to eat or drink after your procedure.  Return to your normal activities as told by your health care provider. Ask your health care provider what activities are safe for you.  Take over-the-counter and prescription medicines only as told by your health care provider.  Do not drive for 24 hours if you were given a sedative during your procedure.  Keep all follow-up visits as told by your health care provider. This is important. Contact a health care provider if you have:  A sore throat that lasts longer than one day.  Trouble swallowing. Get help right away if:  You vomit blood or your vomit looks like coffee grounds.  You have: ? A fever. ? Bloody, black, or tarry stools. ? A severe sore throat or you cannot swallow. ? Difficulty breathing. ? Severe pain in your chest or abdomen. Summary  After the procedure, it is common to have a sore throat, mild stomach discomfort, bloating, and nausea.  Do not drive for 24 hours if you were given a sedative during the procedure.  Follow instructions from  your health care provider about what to eat or drink after your procedure.  Return to your normal activities as told by your health care provider. This information is not intended to replace advice given to you by your health care provider. Make sure you discuss any questions you have with your health care provider. Document Revised: 04/10/2018 Document Reviewed: 03/19/2018 Elsevier Patient Education  [place].     Colonoscopy, Adult, Care After This sheet gives you information about how to care for yourself after your procedure. Your doctor may also give you more specific instructions. If you have problems or questions, call your doctor. What can I expect after the procedure? After the procedure, it is common to have:  A small amount of blood in your poop (stool) for 24 hours.  Some gas.  Mild cramping or bloating in your belly (abdomen). Follow these instructions at home: Eating and drinking   Drink enough fluid to keep your pee (urine) pale yellow.  Follow instructions from your doctor about what you cannot eat or drink.  Return to your normal diet as told by your doctor. Avoid heavy or fried foods that are hard to digest. Activity  Rest as told by your doctor.  Do not sit for a long time without moving. Get up to take short walks every 1-2 hours. This is important. Ask for help if you feel weak or unsteady.  Return to your normal activities as told by your doctor. Ask your doctor what activities are safe for you. To help cramping and bloating:  Try walking around.  Put heat on your belly as told by your doctor. Use the heat source that your doctor recommends, such as a moist heat pack or a heating pad. ? Put a towel between your skin and the heat source. ? Leave the heat on for 20-30 minutes. ? Remove the heat if your skin turns bright red. This is very important if you are unable to feel pain, heat, or cold. You may have a greater risk of getting  burned. General instructions  For the first 24 hours after the procedure: ? Do not drive or use machinery. ? Do not sign important documents. ? Do not drink alcohol. ? Do your daily activities more slowly than normal. ? Eat foods that are soft and easy to digest.  Take over-the-counter or prescription medicines only as told by your doctor.  Keep all follow-up visits as told by your doctor. This is important. Contact a doctor if:  You have blood in your poop 2-3 days after the procedure. Get help right away if:  You have more than a small amount of blood in your poop.  You see large clumps of tissue (blood clots) in your poop.  Your belly is swollen.  You feel like you may vomit (nauseous).  You vomit.  You have a fever.  You have belly pain that gets worse, and medicine does not help your pain. Summary  After the procedure, it is common to have a small amount of blood in your poop. You may also have mild cramping and bloating in your belly.  For the first 24 hours after the procedure, do not drive or use machinery, do not sign important documents, and do not drink alcohol.  Get help right away if you have a lot of blood in your poop, feel like you may vomit, have a fever, or have more belly pain. This information is not intended to replace advice given to you by your health care provider. Make sure you discuss any questions you have with your health care provider. Document Revised: 05/13/2019 Document Reviewed: 05/13/2019 Elsevier Patient Education  [place].     Colon Polyps  Polyps are tissue growths inside the body. Polyps can grow in many places, including the large intestine (colon). A polyp may be a round bump or a mushroom-shaped growth. You could have one polyp or several. Most colon polyps are noncancerous (benign). However, some colon polyps can become cancerous over time. Finding and removing the polyps early can help prevent this. What are the  causes? The exact cause of colon polyps is not known. What increases the risk? You are more likely to develop this condition if you:  Have a family history of colon cancer or colon polyps.  Are older than 90 or older than 45 if you are African American.  Have inflammatory bowel disease, such as ulcerative colitis or Crohn's disease.  Have certain hereditary conditions, such as: ? Familial adenomatous polyposis. ? Lynch syndrome. ? Turcot syndrome. ? Peutz-Jeghers syndrome.  Are overweight.  Smoke cigarettes.  Do not get enough exercise.  Drink too much alcohol.  Eat a diet that is high in fat and red meat and low in fiber.  Had childhood cancer that was treated with abdominal radiation. What are the signs or symptoms? Most polyps do not cause symptoms. If you have symptoms, they may include:  Blood coming from your rectum when having a bowel movement.  Blood in your stool. The stool may look dark  red or black.  Abdominal pain.  A change in bowel habits, such as constipation or diarrhea. How is this diagnosed? This condition is diagnosed with a colonoscopy. This is a procedure in which a lighted, flexible scope is inserted into the anus and then passed into the colon to examine the area. Polyps are sometimes found when a colonoscopy is done as part of routine cancer screening tests. How is this treated? Treatment for this condition involves removing any polyps that are found. Most polyps can be removed during a colonoscopy. Those polyps will then be tested for cancer. Additional treatment may be needed depending on the results of testing. Follow these instructions at home: Lifestyle  Maintain a healthy weight, or lose weight if recommended by your health care provider.  Exercise every day or as told by your health care provider.  Do not use any products that contain nicotine or tobacco, such as cigarettes and e-cigarettes. If you need help quitting, ask your health  care provider.  If you drink alcohol, limit how much you have: ? 0-1 drink a day for women. ? 0-2 drinks a day for men.  Be aware of how much alcohol is in your drink. In the [place] one drink equals one 12 oz bottle of beer (355 mL), one 5 oz glass of wine (148 mL), or one 1 oz shot of hard liquor (44 mL). Eating and drinking   Eat foods that are high in fiber, such as fruits, vegetables, and whole grains.  Eat foods that are high in calcium and vitamin D, such as milk, cheese, yogurt, eggs, liver, fish, and broccoli.  Limit foods that are high in fat, such as fried foods and desserts.  Limit the amount of red meat and processed meat you eat, such as hot dogs, sausage, bacon, and lunch meats. General instructions  Keep all follow-up visits as told by your health care provider. This is important. ? This includes having regularly scheduled colonoscopies. ? Talk to your health care provider about when you need a colonoscopy. Contact a health care provider if:  You have new or worsening bleeding during a bowel movement.  You have new or increased blood in your stool.  You have a change in bowel habits.  You lose weight for no known reason. Summary  Polyps are tissue growths inside the body. Polyps can grow in many places, including the colon.  Most colon polyps are noncancerous (benign), but some can become cancerous over time.  This condition is diagnosed with a colonoscopy.  Treatment for this condition involves removing any polyps that are found. Most polyps can be removed during a colonoscopy. This information is not intended to replace advice given to you by your health care provider. Make sure you discuss any questions you have with your health care provider. Document Revised: 02/01/2018 Document Reviewed: 02/01/2018 Elsevier Patient Education  [place].     Diverticulosis  Diverticulosis is a condition that develops when small pouches (diverticula) form  in the wall of the large intestine (colon). The colon is where water is absorbed and stool (feces) is formed. The pouches form when the inside layer of the colon pushes through weak spots in the outer layers of the colon. You may have a few pouches or many of them. The pouches usually do not cause problems unless they become inflamed or infected. When this happens, the condition is called diverticulitis. What are the causes? The cause of this condition is not known. What increases the  risk? The following factors may make you more likely to develop this condition:  Being older than age 58. Your risk for this condition increases with age. Diverticulosis is rare among people younger than age 91. By age 14, many people have it.  Eating a low-fiber diet.  Having frequent constipation.  Being overweight.  Not getting enough exercise.  Smoking.  Taking over-the-counter pain medicines, like aspirin and ibuprofen.  Having a family history of diverticulosis. What are the signs or symptoms? In most people, there are no symptoms of this condition. If you do have symptoms, they may include:  Bloating.  Cramps in the abdomen.  Constipation or diarrhea.  Pain in the lower left side of the abdomen. How is this diagnosed? Because diverticulosis usually has no symptoms, it is most often diagnosed during an exam for other colon problems. The condition may be diagnosed by:  Using a flexible scope to examine the colon (colonoscopy).  Taking an X-ray of the colon after dye has been put into the colon (barium enema).  Having a CT scan. How is this treated? You may not need treatment for this condition. Your health care provider may recommend treatment to prevent problems. You may need treatment if you have symptoms or if you previously had diverticulitis. Treatment may include:  Eating a high-fiber diet.  Taking a fiber supplement.  Taking a live bacteria supplement (probiotic).  Taking  medicine to relax your colon. Follow these instructions at home: Medicines  Take over-the-counter and prescription medicines only as told by your health care provider.  If told by your health care provider, take a fiber supplement or probiotic. Constipation prevention Your condition may cause constipation. To prevent or treat constipation, you may need to:  Drink enough fluid to keep your urine pale yellow.  Take over-the-counter or prescription medicines.  Eat foods that are high in fiber, such as beans, whole grains, and fresh fruits and vegetables.  Limit foods that are high in fat and processed sugars, such as fried or sweet foods.  General instructions  Try not to strain when you have a bowel movement.  Keep all follow-up visits as told by your health care provider. This is important. Contact a health care provider if you:  Have pain in your abdomen.  Have bloating.  Have cramps.  Have not had a bowel movement in 3 days. Get help right away if:  Your pain gets worse.  Your bloating becomes very bad.  You have a fever or chills, and your symptoms suddenly get worse.  You vomit.  You have bowel movements that are bloody or black.  You have bleeding from your rectum. Summary  Diverticulosis is a condition that develops when small pouches (diverticula) form in the wall of the large intestine (colon).  You may have a few pouches or many of them.  This condition is most often diagnosed during an exam for other colon problems.  Treatment may include increasing the fiber in your diet, taking supplements, or taking medicines. This information is not intended to replace advice given to you by your health care provider. Make sure you discuss any questions you have with your health care provider. Document Revised: 05/16/2019 Document Reviewed: 05/16/2019 Elsevier Patient Education  [place] After These instructions  provide you with information about caring for yourself after your procedure. Your health care provider may also give you more specific instructions. Your treatment has been planned according to current medical  practices, but problems sometimes occur. Call your health care provider if you have any problems or questions after your procedure. What can I expect after the procedure? After your procedure, you may: Feel sleepy for several hours. Feel clumsy and have poor balance for several hours. Feel forgetful about what happened after the procedure. Have poor judgment for several hours. Feel nauseous or vomit. Have a sore throat if you had a breathing tube during the procedure. Follow these instructions at home: For at least 24 hours after the procedure:     Have a responsible adult stay with you. It is important to have someone help care for you until you are awake and alert. Rest as needed. Do not: Participate in activities in which you could fall or become injured. Drive. Use heavy machinery. Drink alcohol. Take sleeping pills or medicines that cause drowsiness. Make important decisions or sign legal documents. Take care of children on your own. Eating and drinking Follow the diet that is recommended by your health care provider. If you vomit, drink water, juice, or soup when you can drink without vomiting. Make sure you have little or no nausea before eating solid foods. General instructions Take over-the-counter and prescription medicines only as told by your health care provider. If you have sleep apnea, surgery and certain medicines can increase your risk for breathing problems. Follow instructions from your health care provider about wearing your sleep device: Anytime you are sleeping, including during daytime naps. While taking prescription pain medicines, sleeping medicines, or medicines that make you drowsy. If you smoke, do not smoke without supervision. Keep all follow-up  visits as told by your health care provider. This is important. Contact a health care provider if: You keep feeling nauseous or you keep vomiting. You feel light-headed. You develop a rash. You have a fever. Get help right away if: You have trouble breathing. Summary For several hours after your procedure, you may feel sleepy and have poor judgment. Have a responsible adult stay with you for at least 24 hours or until you are awake and alert. This information is not intended to replace advice given to you by your health care provider. Make sure you discuss any questions you have with your health care provider. Document Revised: 01/15/2018 Document Reviewed: 02/07/2016 Elsevier Patient Education  [place].

## 2020-08-28 NOTE — Op Note (Signed)
[facility] Patient Name: Andrew Adams Procedure Date: 08/28/2020 3:33 PM MRN: [id number] Date of Birth: [birth year] Attending MD: [name] ,  CSN: [id number] Age: 74 Admit Type: Outpatient Procedure:                Colonoscopy Indications:              Iron deficiency anemia Providers:                [name], Caprice Kluver, Crystal Page, Nelma Rothman, Technician Referring MD:              Medicines:                Monitored Anesthesia Care Complications:            No immediate complications. Estimated Blood Loss:     Estimated blood loss: none. Procedure:                Pre-Anesthesia Assessment:                           - Prior to the procedure, a History and Physical                            was performed, and patient medications, allergies                            and sensitivities were reviewed. The patient's                            tolerance of previous anesthesia was reviewed.                           - The risks and benefits of the procedure and the                            sedation options and risks were discussed with the                            patient. All questions were answered and informed                            consent was obtained.                           - ASA Grade Assessment: III - A patient with severe                            systemic disease.                           After obtaining informed consent, the colonoscope                            was passed under direct vision. Throughout the  procedure, the patient's blood pressure, pulse, and                            oxygen saturations were monitored continuously. The                            [id number]-[id number] ([id number]) scope was introduced through                            the anus and advanced to the the cecum, identified                            by appendiceal orifice and ileocecal valve. The                             colonoscopy was performed without difficulty. The                            patient tolerated the procedure well. Scope In: 3:37:18 PM Scope Out: 4:24:06 PM Scope Withdrawal Time: 0 hours 37 minutes 38 seconds  Total Procedure Duration: 0 hours 46 minutes 48 seconds  Findings:      Hemorrhoids were found on perianal exam, one of them was indurated.      A 3 mm polyp was found in the cecum. The polyp was semi-sessile. The       polyp was removed with a cold biopsy forceps. Resection and retrieval       were complete.      Two sessile polyps were found in the transverse colon and ascending       colon. The polyps were 4 to 6 mm in size. These polyps were removed with       a cold snare. Resection and retrieval were complete.      A 10 mm polyp was found in the sigmoid colon. The resection of the polyp       was aided with the use of a transparent cap. The polyp was sessile. The       polyp was removed with a cold snare. Resection and retrieval were       complete. To prevent bleeding after the polypectomy, two hemostatic       clips were successfully placed. There was no bleeding at the end of the       procedure.      Multiple small and large-mouthed diverticula were found in the sigmoid       colon and descending colon.      Non-bleeding internal hemorrhoids were found during retroflexion. The       hemorrhoids were small. Impression:               - Hemorrhoids found on perianal exam.                           - One 3 mm polyp in the cecum, removed with a cold                            biopsy forceps. Resected and retrieved.                           -  Two 4 to 6 mm polyps in the transverse colon and                            in the ascending colon, removed with a cold snare.                            Resected and retrieved.                           - One 10 mm polyp in the sigmoid colon, removed                            with a cold snare. Resected and retrieved. Clips                             were placed.                           - Diverticulosis in the sigmoid colon and in the                            descending colon.                           - Non-bleeding internal hemorrhoids. Moderate Sedation:      Per Anesthesia Care Recommendation:           - Discharge patient to home (ambulatory).                           - High fiber diet.                           - Await pathology results.                           - Repeat colonoscopy for surveillance based on                            pathology results. Procedure Code(s):        --- Professional ---                           [phone], GC, Colonoscopy, flexible; with removal of                            tumor(s), polyp(s), or other lesion(s) by snare                            technique                           45380, 30, Colonoscopy, flexible; with biopsy,                            single or multiple Diagnosis Code(s):        ---  Professional ---                           K64.8, Other hemorrhoids                           K63.5, Polyp of colon                           D50.9, Iron deficiency anemia, unspecified                           K57.30, Diverticulosis of large intestine without                            perforation or abscess without bleeding CPT copyright 2019 American Medical Association. All rights reserved. The codes documented in this report are preliminary and upon coder review may  be revised to meet current compliance requirements. [name], MD [name],  08/28/2020 4:50:45 PM This report has been signed electronically. Number of Addenda: 0

## 2020-08-28 NOTE — H&P (Signed)
Andrew Adams is an 74 y.o. male.   Chief Complaint: IDA HPI:  Andrew Adams is a 74 y.o.malewith medical history significant forchronic diastolic heart failure,COPD, CKD,chronic alcohol abuse, hypertension, hyperlipidemia, GERD, recent hospitalization due to pneumonia currently on oxygen supplementation, who comes to the hospital for evaluation of iron deficiency anemia.  Patient has had chronic anemia of CKD between 8 and 10 with MCV in the normocytic range of 85.  His iron stores have been low with his previous iron testing showing an iron level of 81, ferritin 14 and iron saturation of 18% on 05/07/2020.  He has been on oral iron supplementation.  Denies having any melena, hematochezia, nausea, vomiting, fever, chills, hematemesis or any other symptom.  Last EGD: never Last Colonoscopy: Last colonoscopy performed 2015 showed 1 polyp in the cecum, 3 mm in diameter which was resected.  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  No FHx of CRC or GI malignancy  Past Medical History:  Diagnosis Date  . Anemia   . Borderline diabetic   . Chronic kidney disease   . GERD (gastroesophageal reflux disease)   . Gout   . Gouty arthritis   . H/O small bowel obstruction   . History of back surgery   . Hyperlipidemia   . Hypertension   . PE (pulmonary embolism)   . Sleep apnea     Past Surgical History:  Procedure Laterality Date  . ABDOMINAL SURGERY  2103   Smal bowel obstruction   . ABDOMINAL SURGERY  1981   Perforated large intestine  . back     Lower back  . CATARACT EXTRACTION W/PHACO Right 01/06/2020   Procedure: CATARACT EXTRACTION PHACO AND INTRAOCULAR LENS PLACEMENT (IOC) (CDE: 6.48);  Surgeon: [name], MD;  Location: [facility];  Service: Ophthalmology;  Laterality: Right;  . CATARACT EXTRACTION W/PHACO Left 01/20/2020   Procedure: CATARACT EXTRACTION PHACO AND INTRAOCULAR LENS PLACEMENT (IOC);  Surgeon:  [name], MD;  Location: [facility];  Service: Ophthalmology;  Laterality: Left;  CDE: 5.86  . HERNIA REPAIR     incisional    Family History  Problem Relation Age of Onset  . Hypertension Sister   . Diabetes Sister   . Diabetes Sister   . Hypertension Mother   . Diabetes Mother   . Hypertension Father   . Ulcers Father    Social History:  reports that he quit smoking about 25 years ago. His smoking use included cigarettes. He has a 38.00 pack-year smoking history. He has never used smokeless tobacco. He reports current alcohol use. He reports that he does not use drugs.  Allergies: No Known Allergies  Medications Prior to Admission  Medication Sig Dispense Refill  . allopurinol (ZYLOPRIM) 100 MG tablet Take 1 tablet (100 mg total) by mouth daily. 90 tablet 1  . amLODipine (NORVASC) 10 MG tablet Take 10 mg by mouth daily.     Marland Kitchen atorvastatin (LIPITOR) 40 MG tablet Take 1 tablet (40 mg total) by mouth daily. 90 tablet 1  . epoetin alfa-epbx (RETACRIT) 3000 UNIT/ML injection Inject 3,000 Units into the vein every 14 (fourteen) days.     . furosemide (LASIX) 40 MG tablet Take 40 mg by mouth.    . losartan (COZAAR) 50 MG tablet TAKE ONE TABLET BY MOUTH TWICE DAILY (Patient taking differently: Take 50 mg by mouth in the morning and at bedtime. ) 60 tablet 2  . metoprolol (LOPRESSOR) 50  MG tablet Take 1 tablet (50 mg total) by mouth 2 (two) times daily. 180 tablet 1  . Multiple Vitamin (MULTIVITAMIN WITH MINERALS) TABS tablet Take 1 tablet by mouth daily.    . pantoprazole (PROTONIX) 40 MG tablet Take 1 tablet (40 mg total) by mouth daily. 90 tablet 1  . TRULICITY 1.24 PY/0.9XI SOPN Inject 0.75 mg into the skin every 7 (seven) days.     . ferrous sulfate (FERROUSUL) 325 (65 FE) MG tablet Take 1 tablet (325 mg total) by mouth daily with breakfast. (Patient not taking: Reported on 08/18/2020) 90 tablet 1  . ipratropium-albuterol (DUONEB) 0.5-2.5 (3) MG/3ML SOLN Take 3 mLs by nebulization 3  (three) times daily. (Patient not taking: Reported on 08/18/2020) 360 mL 0  . thiamine 250 MG tablet Take 250 mg by mouth daily. (Patient not taking: Reported on 08/18/2020)      No results found for this or any previous visit (from the past 48 hour(s)). No results found.  Review of Systems  Constitutional: Negative.   HENT: Negative.   Eyes: Negative.   Respiratory: Negative.   Cardiovascular: Negative.   Gastrointestinal: Negative.   Endocrine: Negative.   Genitourinary: Negative.   Musculoskeletal: Negative.   Skin: Negative.   Allergic/Immunologic: Negative.   Neurological: Negative.   Hematological: Negative.   Psychiatric/Behavioral: Negative.     Pulse 69, temperature 98 F (36.7 C), temperature source Oral, resp. rate 16, SpO2 98 %. Physical Exam  .GENERAL: The patient is AO x3, in no acute distress. HEENT: Head is normocephalic and atraumatic. EOMI are intact. Mouth is well hydrated and without lesions. NECK: Supple. No masses LUNGS: Clear to auscultation. No presence of rhonchi/wheezing/rales. Adequate chest expansion HEART: RRR, normal s1 and s2. ABDOMEN: Soft, nontender, no guarding, no peritoneal signs, and nondistended. BS +. No masses. EXTREMITIES: Without any cyanosis, clubbing, rash, lesions or edema. NEUROLOGIC: AOx3, no focal motor deficit. SKIN: no jaundice, no rashes  Assessment/Plan Andrew Adams is a 74 y.o.malewith medical history significant forchronic diastolic heart failure,COPD, CKD,chronic alcohol abuse, hypertension, hyperlipidemia, GERD, recent hospitalization due to pneumonia currently on oxygen supplementation, who comes to the hospital for evaluation of iron deficiency anemia.  We will proceed with EGD with small bowel biopsies and colonoscopy.  Harvel Quale, MD 08/28/2020, 2:20 PM

## 2020-08-29 DIAGNOSIS — E1122 Type 2 diabetes mellitus with diabetic chronic kidney disease: Secondary | ICD-10-CM | POA: Diagnosis not present

## 2020-08-29 DIAGNOSIS — I129 Hypertensive chronic kidney disease with stage 1 through stage 4 chronic kidney disease, or unspecified chronic kidney disease: Secondary | ICD-10-CM | POA: Diagnosis not present

## 2020-08-29 DIAGNOSIS — N184 Chronic kidney disease, stage 4 (severe): Secondary | ICD-10-CM | POA: Diagnosis not present

## 2020-08-31 ENCOUNTER — Encounter (HOSPITAL_COMMUNITY): Payer: Self-pay

## 2020-08-31 ENCOUNTER — Other Ambulatory Visit: Payer: Self-pay

## 2020-08-31 ENCOUNTER — Encounter (HOSPITAL_COMMUNITY)
Admission: RE | Admit: 2020-08-31 | Discharge: 2020-08-31 | Disposition: A | Discharge status: Home / Self Care | Payer: Medicare HMO | Source: Ambulatory Visit | Attending: Nephrology | Admitting: Nephrology

## 2020-08-31 DIAGNOSIS — N184 Chronic kidney disease, stage 4 (severe): Secondary | ICD-10-CM | POA: Diagnosis not present

## 2020-08-31 DIAGNOSIS — D631 Anemia in chronic kidney disease: Secondary | ICD-10-CM | POA: Insufficient documentation

## 2020-08-31 LAB — POCT HEMOGLOBIN-HEMACUE: Hemoglobin: 8.6 g/dL — ABNORMAL LOW (ref 13.0–17.0)

## 2020-08-31 MED ORDER — EPOETIN ALFA-EPBX 3000 UNIT/ML IJ SOLN
3000.0000 [IU] | Freq: Once | INTRAMUSCULAR | Status: AC
  Administered 2020-08-31: 3000 [IU] via SUBCUTANEOUS
  Filled 2020-08-31: qty 1

## 2020-09-01 LAB — SURGICAL PATHOLOGY

## 2020-09-02 ENCOUNTER — Encounter (INDEPENDENT_AMBULATORY_CARE_PROVIDER_SITE_OTHER): Payer: Self-pay | Admitting: *Deleted

## 2020-09-03 ENCOUNTER — Encounter (HOSPITAL_COMMUNITY): Payer: Self-pay | Admitting: Gastroenterology

## 2020-09-09 DIAGNOSIS — R69 Illness, unspecified: Secondary | ICD-10-CM | POA: Diagnosis not present

## 2020-09-10 ENCOUNTER — Encounter (HOSPITAL_COMMUNITY)
Admission: RE | Disposition: A | Discharge status: Home / Self Care | Payer: Self-pay | Source: Home / Self Care | Attending: Gastroenterology

## 2020-09-10 ENCOUNTER — Ambulatory Visit (HOSPITAL_COMMUNITY)
Admission: RE | Admit: 2020-09-10 | Discharge: 2020-09-10 | Disposition: A | Discharge status: Home / Self Care | Payer: Medicare HMO | Attending: Gastroenterology | Admitting: Gastroenterology

## 2020-09-10 DIAGNOSIS — Z9889 Other specified postprocedural states: Secondary | ICD-10-CM

## 2020-09-10 DIAGNOSIS — K317 Polyp of stomach and duodenum: Secondary | ICD-10-CM | POA: Diagnosis not present

## 2020-09-10 DIAGNOSIS — D132 Benign neoplasm of duodenum: Secondary | ICD-10-CM | POA: Diagnosis not present

## 2020-09-10 DIAGNOSIS — D500 Iron deficiency anemia secondary to blood loss (chronic): Secondary | ICD-10-CM | POA: Insufficient documentation

## 2020-09-10 DIAGNOSIS — D509 Iron deficiency anemia, unspecified: Secondary | ICD-10-CM

## 2020-09-10 HISTORY — PX: GIVENS CAPSULE STUDY: SHX5432

## 2020-09-10 SURGERY — IMAGING PROCEDURE, GI TRACT, INTRALUMINAL, VIA CAPSULE

## 2020-09-14 ENCOUNTER — Encounter (HOSPITAL_COMMUNITY)
Admission: RE | Admit: 2020-09-14 | Discharge: 2020-09-14 | Disposition: A | Discharge status: Home / Self Care | Payer: Medicare HMO | Source: Ambulatory Visit | Attending: Nephrology | Admitting: Nephrology

## 2020-09-14 ENCOUNTER — Encounter (HOSPITAL_COMMUNITY): Payer: Self-pay

## 2020-09-14 ENCOUNTER — Other Ambulatory Visit: Payer: Self-pay

## 2020-09-14 DIAGNOSIS — D631 Anemia in chronic kidney disease: Secondary | ICD-10-CM | POA: Diagnosis not present

## 2020-09-14 DIAGNOSIS — N184 Chronic kidney disease, stage 4 (severe): Secondary | ICD-10-CM | POA: Diagnosis not present

## 2020-09-14 LAB — POCT HEMOGLOBIN-HEMACUE: Hemoglobin: 9.4 g/dL — ABNORMAL LOW (ref 13.0–17.0)

## 2020-09-14 MED ORDER — EPOETIN ALFA-EPBX 3000 UNIT/ML IJ SOLN
3000.0000 [IU] | Freq: Once | INTRAMUSCULAR | Status: AC
  Administered 2020-09-14: 3000 [IU] via SUBCUTANEOUS

## 2020-09-15 MED ORDER — EPOETIN ALFA-EPBX 3000 UNIT/ML IJ SOLN
INTRAMUSCULAR | Status: AC
  Filled 2020-09-15: qty 1

## 2020-09-15 NOTE — Procedures (Signed)
Small Bowel Givens Capsule Study Procedure date:  09/10/2020  Referring Provider:  PCP PCP:  Dr. Lavella Lemons, PA  Indication for procedure:  IDA and large duodenal polyp  Findings:  Presence of medium size polyp in proximal duodenum, no other mucosal lesions were seen through the small bowel. No presence of hematin throughout the capsule recording. Presence of stool in the colon.    First Gastric image:  00:02:36 First Duodenal image: 00:17:42 First Ileo-Cecal Valve image: 04:37:26 First Cecal image: 04:38:13 Gastric Passage time: 00h 38m Small Bowel Passage time:  04h 53m  Summary & Recommendations: - Will refer case to advance endoscopist at [facility] for duodenal lesion resection - Continue iron supplementation  I called the patient and his wife to inform them about the result, they did not pick up the phone, left a voice message to call back to the office.  [name], MD Gastroenterology and Hepatology [facility]

## 2020-09-16 ENCOUNTER — Encounter (HOSPITAL_COMMUNITY): Payer: Self-pay | Admitting: Gastroenterology

## 2020-09-16 ENCOUNTER — Other Ambulatory Visit (INDEPENDENT_AMBULATORY_CARE_PROVIDER_SITE_OTHER): Payer: Self-pay | Admitting: Gastroenterology

## 2020-09-16 ENCOUNTER — Telehealth: Payer: Self-pay

## 2020-09-16 DIAGNOSIS — D132 Benign neoplasm of duodenum: Secondary | ICD-10-CM

## 2020-09-16 NOTE — Telephone Encounter (Signed)
Appt has been made for 11/04/20 at 130 pm with Dr Rush Landmark.  The pt has been advised.  He has also been mailed a letter with the appt information.

## 2020-09-16 NOTE — Telephone Encounter (Signed)
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  I just spoke to the patient and he understood, he know he is a high-risk patient but is amenable to have the EGD performed. I will order the CT scan abdomen/pelvis with IV contrast, which I guess he will have done within a month and will forward you the results. He would like to set up the appointment with your office to discuss the procedure.  Thanks so much!  [name], MD Gastroenterology and Hepatology [facility] ----- Message ----- From: Irving Copas., MD Sent: 09/16/2020   6:32 AM EST To: Milus Banister, MD, Timothy Lasso, RN, # Subject: RE: Large size Tubulovillious adenoma of the#  DCM, Happy to meet patient. It will be a high-risk procedure to say the least. I would proceed with obtaining at least a CT-Abdomen v CT-Abdomen/Pelvis to better define and ensure we are not seeing anything that is more concerning at this time in regards to enhancement. There is a chance we could evaluate him and do procedure and find that it is not endoscopically resectable and refer to surgery, but I think it is worth looking at. If he is amenable, we will get him set up for a clinic visit and then proceed with scheduling an EGD with EMR 90 minute case.  Will likely be in January based on availability at this time. Let Elleah Hemsley and I know what he decides after you talk with him. Thanks. GM ----- Message ----- From: Milus Banister, MD Sent: 09/16/2020   5:58 AM EST To: Timothy Lasso, RN, Irving Copas., MD, # Subject: RE: Large size Tubulovillious adenoma of the#  This is not something I would feel comfortable taking on.   ----- Message ----- From: Harvel Quale, MD Sent: 09/15/2020   7:05 PM EST To: Milus Banister, MD, Timothy Lasso, RN,  # Subject: Large size Tubulovillious adenoma of the duo#  Hi, I recently took care of Mr. Milich. While investigating his IDA, I found a very large (at least 4 cm) TV adenoma int he 3rd part of his duodenum. His endoscopy was very difficult as I had to pull in and out the scope since he desaturated multiple times. He does not have too much lung reserve due to a bad pneumonia episode oin the past, currently uses oxygen. He is interested in having his polyp removed, but I guess this will need to be done possibly under [place]. I thinks this could be a polyp that could be resected endoscopically.  I did a capsule last week and did not find any other lesions.  Please let me know your thought.  Sincerely,  [name], MD Gastroenterology and Hepatology [facility]  ----- Message ----- From: Interface, Lab In [facility] Sent: 09/01/2020  11:24 AM EST To: Harvel Quale, MD

## 2020-09-16 NOTE — Progress Notes (Signed)
Will schedule CT without IV contrast due to CKD.  Thanks,  [name], MD Gastroenterology and Hepatology [facility]

## 2020-09-16 NOTE — Telephone Encounter (Signed)
Thanks for the update. GM

## 2020-09-17 NOTE — Telephone Encounter (Signed)
Thanks  Alexas Basulto Castaneda, MD Gastroenterology and Hepatology [facility]  

## 2020-09-20 DIAGNOSIS — G4733 Obstructive sleep apnea (adult) (pediatric): Secondary | ICD-10-CM | POA: Diagnosis not present

## 2020-09-20 DIAGNOSIS — I509 Heart failure, unspecified: Secondary | ICD-10-CM | POA: Diagnosis not present

## 2020-09-20 DIAGNOSIS — G4736 Sleep related hypoventilation in conditions classified elsewhere: Secondary | ICD-10-CM | POA: Diagnosis not present

## 2020-09-20 DIAGNOSIS — J449 Chronic obstructive pulmonary disease, unspecified: Secondary | ICD-10-CM | POA: Diagnosis not present

## 2020-09-21 DIAGNOSIS — G4733 Obstructive sleep apnea (adult) (pediatric): Secondary | ICD-10-CM | POA: Diagnosis not present

## 2020-09-21 NOTE — Progress Notes (Signed)
Forwarded to Leigh Johnnae Impastato to schedule 

## 2020-09-21 NOTE — Progress Notes (Signed)
Thanks

## 2020-09-21 NOTE — Progress Notes (Signed)
Scheduled on 10/20/20 at 9:00 am and he is aware

## 2020-09-29 ENCOUNTER — Encounter (HOSPITAL_COMMUNITY)
Admission: RE | Admit: 2020-09-29 | Discharge: 2020-09-29 | Disposition: A | Discharge status: Home / Self Care | Payer: Medicare HMO | Source: Ambulatory Visit | Attending: Nephrology | Admitting: Nephrology

## 2020-09-29 ENCOUNTER — Other Ambulatory Visit: Payer: Self-pay

## 2020-09-29 ENCOUNTER — Encounter (HOSPITAL_COMMUNITY): Payer: Self-pay

## 2020-09-29 DIAGNOSIS — I129 Hypertensive chronic kidney disease with stage 1 through stage 4 chronic kidney disease, or unspecified chronic kidney disease: Secondary | ICD-10-CM | POA: Diagnosis not present

## 2020-09-29 DIAGNOSIS — D631 Anemia in chronic kidney disease: Secondary | ICD-10-CM | POA: Diagnosis not present

## 2020-09-29 DIAGNOSIS — N184 Chronic kidney disease, stage 4 (severe): Secondary | ICD-10-CM | POA: Diagnosis not present

## 2020-09-29 DIAGNOSIS — E1122 Type 2 diabetes mellitus with diabetic chronic kidney disease: Secondary | ICD-10-CM | POA: Diagnosis not present

## 2020-09-29 LAB — POCT HEMOGLOBIN-HEMACUE: Hemoglobin: 9.7 g/dL — ABNORMAL LOW (ref 13.0–17.0)

## 2020-09-29 MED ORDER — EPOETIN ALFA-EPBX 3000 UNIT/ML IJ SOLN
INTRAMUSCULAR | Status: AC
  Filled 2020-09-29: qty 1

## 2020-09-29 MED ORDER — EPOETIN ALFA-EPBX 3000 UNIT/ML IJ SOLN
3000.0000 [IU] | Freq: Once | INTRAMUSCULAR | Status: AC
  Administered 2020-09-29: 3000 [IU] via SUBCUTANEOUS

## 2020-10-05 ENCOUNTER — Other Ambulatory Visit: Payer: Self-pay | Admitting: Internal Medicine

## 2020-10-06 ENCOUNTER — Other Ambulatory Visit: Payer: Self-pay | Admitting: Internal Medicine

## 2020-10-06 DIAGNOSIS — R69 Illness, unspecified: Secondary | ICD-10-CM | POA: Diagnosis not present

## 2020-10-13 ENCOUNTER — Encounter (HOSPITAL_COMMUNITY)
Admission: RE | Admit: 2020-10-13 | Discharge: 2020-10-13 | Disposition: A | Discharge status: Home / Self Care | Payer: Medicare HMO | Source: Ambulatory Visit | Attending: Nephrology | Admitting: Nephrology

## 2020-10-13 ENCOUNTER — Other Ambulatory Visit: Payer: Self-pay

## 2020-10-13 ENCOUNTER — Encounter (HOSPITAL_COMMUNITY): Payer: Self-pay

## 2020-10-13 DIAGNOSIS — N184 Chronic kidney disease, stage 4 (severe): Secondary | ICD-10-CM | POA: Insufficient documentation

## 2020-10-13 DIAGNOSIS — D631 Anemia in chronic kidney disease: Secondary | ICD-10-CM | POA: Diagnosis not present

## 2020-10-13 LAB — POCT HEMOGLOBIN-HEMACUE: Hemoglobin: 10.7 g/dL — ABNORMAL LOW (ref 13.0–17.0)

## 2020-10-13 MED ORDER — EPOETIN ALFA-EPBX 3000 UNIT/ML IJ SOLN: 3000.0000 [IU] | Freq: Once | INTRAMUSCULAR | Status: DC

## 2020-10-16 DIAGNOSIS — D631 Anemia in chronic kidney disease: Secondary | ICD-10-CM | POA: Diagnosis not present

## 2020-10-16 DIAGNOSIS — N189 Chronic kidney disease, unspecified: Secondary | ICD-10-CM | POA: Diagnosis not present

## 2020-10-19 DIAGNOSIS — G4733 Obstructive sleep apnea (adult) (pediatric): Secondary | ICD-10-CM | POA: Diagnosis not present

## 2020-10-19 DIAGNOSIS — E1122 Type 2 diabetes mellitus with diabetic chronic kidney disease: Secondary | ICD-10-CM | POA: Diagnosis not present

## 2020-10-19 DIAGNOSIS — N189 Chronic kidney disease, unspecified: Secondary | ICD-10-CM | POA: Diagnosis not present

## 2020-10-19 DIAGNOSIS — D631 Anemia in chronic kidney disease: Secondary | ICD-10-CM | POA: Diagnosis not present

## 2020-10-19 DIAGNOSIS — I129 Hypertensive chronic kidney disease with stage 1 through stage 4 chronic kidney disease, or unspecified chronic kidney disease: Secondary | ICD-10-CM | POA: Diagnosis not present

## 2020-10-19 DIAGNOSIS — I5032 Chronic diastolic (congestive) heart failure: Secondary | ICD-10-CM | POA: Diagnosis not present

## 2020-10-20 ENCOUNTER — Ambulatory Visit (HOSPITAL_COMMUNITY)
Admission: RE | Admit: 2020-10-20 | Discharge: 2020-10-20 | Disposition: A | Discharge status: Home / Self Care | Payer: Medicare HMO | Source: Ambulatory Visit | Attending: Gastroenterology | Admitting: Gastroenterology

## 2020-10-20 ENCOUNTER — Other Ambulatory Visit: Payer: Self-pay

## 2020-10-20 DIAGNOSIS — J449 Chronic obstructive pulmonary disease, unspecified: Secondary | ICD-10-CM | POA: Diagnosis not present

## 2020-10-20 DIAGNOSIS — N3090 Cystitis, unspecified without hematuria: Secondary | ICD-10-CM | POA: Diagnosis not present

## 2020-10-20 DIAGNOSIS — G4733 Obstructive sleep apnea (adult) (pediatric): Secondary | ICD-10-CM | POA: Diagnosis not present

## 2020-10-20 DIAGNOSIS — D132 Benign neoplasm of duodenum: Secondary | ICD-10-CM

## 2020-10-20 DIAGNOSIS — K5730 Diverticulosis of large intestine without perforation or abscess without bleeding: Secondary | ICD-10-CM | POA: Diagnosis not present

## 2020-10-20 DIAGNOSIS — G4736 Sleep related hypoventilation in conditions classified elsewhere: Secondary | ICD-10-CM | POA: Diagnosis not present

## 2020-10-20 DIAGNOSIS — N281 Cyst of kidney, acquired: Secondary | ICD-10-CM | POA: Diagnosis not present

## 2020-10-20 DIAGNOSIS — I708 Atherosclerosis of other arteries: Secondary | ICD-10-CM | POA: Diagnosis not present

## 2020-10-20 DIAGNOSIS — I509 Heart failure, unspecified: Secondary | ICD-10-CM | POA: Diagnosis not present

## 2020-10-27 ENCOUNTER — Encounter (HOSPITAL_COMMUNITY): Payer: Self-pay

## 2020-10-27 ENCOUNTER — Encounter (HOSPITAL_COMMUNITY)
Admission: RE | Admit: 2020-10-27 | Discharge: 2020-10-27 | Disposition: A | Discharge status: Home / Self Care | Payer: Medicare HMO | Source: Ambulatory Visit | Attending: Nephrology | Admitting: Nephrology

## 2020-10-27 ENCOUNTER — Other Ambulatory Visit: Payer: Self-pay

## 2020-10-27 DIAGNOSIS — D631 Anemia in chronic kidney disease: Secondary | ICD-10-CM | POA: Diagnosis not present

## 2020-10-27 DIAGNOSIS — N184 Chronic kidney disease, stage 4 (severe): Secondary | ICD-10-CM | POA: Diagnosis not present

## 2020-10-27 LAB — POCT HEMOGLOBIN-HEMACUE: Hemoglobin: 9.8 g/dL — ABNORMAL LOW (ref 13.0–17.0)

## 2020-10-27 MED ORDER — EPOETIN ALFA-EPBX 3000 UNIT/ML IJ SOLN
3000.0000 [IU] | Freq: Once | INTRAMUSCULAR | Status: AC
  Administered 2020-10-27: 3000 [IU] via SUBCUTANEOUS
  Filled 2020-10-27: qty 1

## 2020-10-30 DIAGNOSIS — E1122 Type 2 diabetes mellitus with diabetic chronic kidney disease: Secondary | ICD-10-CM | POA: Diagnosis not present

## 2020-10-30 DIAGNOSIS — N184 Chronic kidney disease, stage 4 (severe): Secondary | ICD-10-CM | POA: Diagnosis not present

## 2020-10-30 DIAGNOSIS — I129 Hypertensive chronic kidney disease with stage 1 through stage 4 chronic kidney disease, or unspecified chronic kidney disease: Secondary | ICD-10-CM | POA: Diagnosis not present

## 2020-11-04 ENCOUNTER — Ambulatory Visit: Payer: Medicare HMO | Admitting: Gastroenterology

## 2020-11-04 VITALS — BP 146/72 | HR 85 | Ht 69.0 in | Wt 204.0 lb

## 2020-11-04 DIAGNOSIS — D649 Anemia, unspecified: Secondary | ICD-10-CM

## 2020-11-04 DIAGNOSIS — D132 Benign neoplasm of duodenum: Secondary | ICD-10-CM | POA: Diagnosis not present

## 2020-11-04 DIAGNOSIS — R198 Other specified symptoms and signs involving the digestive system and abdomen: Secondary | ICD-10-CM

## 2020-11-04 NOTE — Patient Instructions (Addendum)
If you are age 76 or older, your body mass index should be between 23-30. Your Body mass index is 30.13 kg/m. If this is out of the aforementioned range listed, please consider follow up with your Primary Care Provider.  If you are age 1 or younger, your body mass index should be between 19-25. Your Body mass index is 30.13 kg/m. If this is out of the aformentioned range listed, please consider follow up with your Primary Care Provider.    Due to recent changes in healthcare laws, you may see the results of your imaging and laboratory studies on MyChart before your provider has had a chance to review them.  We understand that in some cases there may be results that are confusing or concerning to you. Not all laboratory results come back in the same time frame and the provider may be waiting for multiple results in order to interpret others.  Please give [place] 48 hours in order for your provider to thoroughly review all the results before contacting the office for clarification of your results.   Your provider has requested that you go to the basement level for lab work before leaving today. Press "B" on the elevator. The lab is located at the first door on the left as you exit the elevator.  If you do not  Wish to have labs done here today, order has been faxed to [place] also @ Fax# [phone].   You have been scheduled for an endoscopy. Please follow written instructions given to you at your visit today. If you use inhalers (even only as needed), please bring them with you on the day of your procedure.   Thank you for choosing me and [facility].  Dr. Rush Landmark

## 2020-11-07 ENCOUNTER — Encounter: Payer: Self-pay | Admitting: Gastroenterology

## 2020-11-07 NOTE — Progress Notes (Signed)
[place] VISIT   Primary Care Provider Lavella Lemons, PA [place] [id number] [phone]  Referring Provider Dr. Jenetta Downer  Patient Profile: Andrew Adams is a 75 y.o. 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  The patient presents to the [facility] for an evaluation and management of problem(s) noted below:  Problem List 1. Duodenal adenoma   2. Anemia, unspecified type   3. Abnormal findings on esophagogastroduodenoscopy (EGD)     History of Present Illness This is the patient's first visit to the [facility].  He is followed by Dr. Jenetta Downer of [facility].  He was recently evaluated in 2021 for Iron deficiency.  He eventually was able to be optimized enough to undergo an EGD/Colonoscopy.  Findings of EGD showed evidence of a large polypoid mass/lesion consistent on sampling as a Tubulovillous adenoma of the duodenum.  It is for this reason that the patient is referred for consideration of advanced resection.  The patient is accompanied by his wife today.  The patient and wife do not express any other GI issues at this time.  They do have overall concern/anxiety in regards to the potential risks of the procedure.  I had discussed with Dr. Jenetta Downer to move forward with performing a CT scan to further evaluate the lesion and ensure we did not see anything that was more concerning for this lesion to already be infiltrative.  Results of the CT scan were discussed with the patient and wife today without evidence of this being malignant up to this point.    GI Review of Systems Positive as above Negative for dysphagia, odynophagia, pain, nausea, vomiting, change in bowel habits  Review of Systems General: Denies fevers/chills/weight loss unintentionally Cardiovascular: Denies  chest pain/palpitations Pulmonary: Denies shortness of breath Gastroenterological: See HPI Genitourinary: Denies darkened urine or hematuria Hematological: Denies easy bruising/bleeding Dermatological: Denies jaundice Psychological: Mood is stable though anxious about risks of procedure   Medications Current Outpatient Medications  Medication Sig Dispense Refill  . allopurinol (ZYLOPRIM) 100 MG tablet Take 1 tablet (100 mg total) by mouth daily. 90 tablet 1  . amLODipine (NORVASC) 10 MG tablet Take 10 mg by mouth daily.     Marland Kitchen atorvastatin (LIPITOR) 40 MG tablet Take 1 tablet (40 mg total) by mouth daily. 90 tablet 1  . epoetin alfa-epbx (RETACRIT) 3000 UNIT/ML injection Inject 3,000 Units into the vein every 14 (fourteen) days.     . furosemide (LASIX) 40 MG tablet Take 40 mg by mouth.    Marland Kitchen ipratropium-albuterol (DUONEB) 0.5-2.5 (3) MG/3ML SOLN Take 3 mLs by nebulization 3 (three) times daily. 360 mL 0  . losartan (COZAAR) 50 MG tablet Take 1 tablet (50 mg total) by mouth in the morning and at bedtime. 60 tablet 2  . metoprolol (LOPRESSOR) 50 MG tablet Take 1 tablet (50 mg total) by mouth 2 (two) times daily. 180 tablet 1  . Multiple Vitamin (MULTIVITAMIN WITH MINERALS) TABS tablet Take 1 tablet by mouth daily.    . OXYGEN Inhale 2 L into the lungs.    . pantoprazole (PROTONIX) 40 MG tablet Take 1 tablet (40 mg total) by mouth daily. 90 tablet 1  . thiamine 250 MG tablet Take 250 mg by mouth daily.    . TRULICITY 1.96 QI/2.9NL SOPN Inject 0.75 mg into the skin every 7 (seven) days.     Marland Kitchen  ferrous sulfate (FERROUSUL) 325 (65 FE) MG tablet Take 1 tablet (325 mg total) by mouth daily with breakfast. (Patient not taking: Reported on 08/18/2020) 90 tablet 1   No current facility-administered medications for this visit.    Allergies No Known Allergies  Histories Past Medical History:  Diagnosis Date  . Anemia   . Borderline diabetic   . Chronic kidney disease   . Duodenal adenoma     2021  . GERD (gastroesophageal reflux disease)   . Gout   . Gouty arthritis   . H/O small bowel obstruction   . History of back surgery   . Hx of adenomatous colonic polyps    2021  . Hyperlipidemia   . Hypertension   . PE (pulmonary embolism)   . Sleep apnea    Past Surgical History:  Procedure Laterality Date  . ABDOMINAL SURGERY  2103   Smal bowel obstruction   . ABDOMINAL SURGERY  1981   Perforated large intestine  . back     Lower back  . BIOPSY  08/28/2020   Procedure: BIOPSY;  Surgeon: Harvel Quale, MD;  [facility];  Service: Gastroenterology;;  . CATARACT EXTRACTION W/PHACO Right 01/06/2020   Procedure: CATARACT EXTRACTION PHACO AND INTRAOCULAR LENS PLACEMENT (IOC) (CDE: 6.48);  Surgeon: [name], MD;  Location: [facility];  Service: Ophthalmology;  Laterality: Right;  . CATARACT EXTRACTION W/PHACO Left 01/20/2020   Procedure: CATARACT EXTRACTION PHACO AND INTRAOCULAR LENS PLACEMENT (IOC);  Surgeon: [name], MD;  Location: [facility];  Service: Ophthalmology;  Laterality: Left;  CDE: 5.86  . COLONOSCOPY WITH PROPOFOL N/A 08/28/2020   Procedure: COLONOSCOPY WITH PROPOFOL;  Surgeon: Harvel Quale, MD;  [facility];  Service: Gastroenterology;  Laterality: N/A;  230  . ESOPHAGOGASTRODUODENOSCOPY (EGD) WITH PROPOFOL N/A 08/28/2020   Procedure: ESOPHAGOGASTRODUODENOSCOPY (EGD) WITH PROPOFOL;  Surgeon: Harvel Quale, MD;  [facility];  Service: Gastroenterology;  Laterality: N/A;  . GIVENS CAPSULE STUDY N/A 09/10/2020   Procedure: GIVENS CAPSULE STUDY;  Surgeon: Harvel Quale, MD;  [facility];  Service: Gastroenterology;  Laterality: N/A;  730  . HERNIA REPAIR     incisional  . POLYPECTOMY  08/28/2020   Procedure: POLYPECTOMY;  Surgeon: Harvel Quale, MD;  [facility];  Service: Gastroenterology;;   Social History   Socioeconomic History  . Marital  status: Married    Spouse name: Not on file  . Number of children: 1  . Years of education: Not on file  . Highest education level: Not on file  Occupational History  . Occupation: Finisher  Tobacco Use  . Smoking status: Former Smoker    Packs/day: 1.00    Years: 38.00    Pack years: 38.00    Types: Cigarettes    Quit date: 02/07/1995    Years since quitting: 25.7  . Smokeless tobacco: Never Used  Vaping Use  . Vaping Use: Never used  Substance and Sexual Activity  . Alcohol use: Yes    Comment: occasional   . Drug use: No  . Sexual activity: Yes  Other Topics Concern  . Not on file  Social History Narrative   Lives with wife.     Social Determinants of Health   Financial Resource Strain: Not on file  Food Insecurity: Not on file  Transportation Needs: Not on file  Physical Activity: Not on file  Stress: Not on file  Social Connections: Not on file  Intimate Partner  Violence: Not on file   Family History  Problem Relation Age of Onset  . Hypertension Sister   . Diabetes Sister   . Diabetes Sister   . Hypertension Mother   . Diabetes Mother   . Hypertension Father   . Ulcers Father   . Colon cancer Neg Hx   . Esophageal cancer Neg Hx   . Stomach cancer Neg Hx   . Inflammatory bowel disease Neg Hx   . Liver disease Neg Hx   . Pancreatic cancer Neg Hx   . Rectal cancer Neg Hx    I have reviewed his medical, social, and family history in detail and updated the electronic medical record as necessary.    PHYSICAL EXAMINATION  BP (!) 146/72 Comment: irregular  Pulse 85   Ht 5\' 9"  (1.753 m)   Wt 204 lb (92.5 kg)   SpO2 99%   BMI 30.13 kg/m  Wt Readings from Last 3 Encounters:  11/04/20 204 lb (92.5 kg)  09/10/20 210 lb (95.3 kg)  08/26/20 220 lb (99.8 kg)  GEN: NAD, appears chronically ill but is non-toxic, accompanied by wife PSYCH: Cooperative, without pressured speech EYE: Conjunctivae pink, sclerae anicteric ENT: Masked though  in place with O2 at  his side CV: Non-tachycardic RESP: No audible wheezing GI: NABS, soft, protuberant abdomen, distended, NT, without rebound or guarding MSK/EXT: LE edema is present SKIN: No jaundice NEURO:  Alert & Oriented x 3, no focal deficits   REVIEW OF DATA  I reviewed the following data at the time of this encounter:  GI Procedures and Studies  October 2021 EGD - 1 cm hiatal hernia. - Multiple gastric polyps. Resected and retrieved. - A single large duodenal polyp. Biopsied. Pathology FINAL MICROSCOPIC DIAGNOSIS:  A. DUODENUM, POLYPECTOMY:  - Duodenal tubulovillous adenoma.  - No invasive carcinoma.  B. STOMACH, POLYPECTOMY:  - Fragments of gastric hyperplastic polyp with focal erosion.  - No dysplasia or invasive carcinoma.   Laboratory Studies  Reviewed those in [facility]  Imaging Studies  December 2021 CTAP IMPRESSION: 1. Apparent mass in the second portion of the duodenum measuring 2.8 x 2.7 x 2.2 cm, likely corresponding to the reported tubulovillous adenoma seen by endoscopy. No ulceration is seen in this mass. There is no extension of mass beyond the confines of the bowel appreciable on nonintravenous contrast study. 2. Extensive sigmoid diverticulosis with probable muscular hypertrophy from chronic diverticulosis in the mid to lower sigmoid region. No frank diverticulitis. 3. No bowel obstruction. No abscess in the abdomen or pelvis. No periappendiceal region inflammatory change. 4. Nodular opacities in the lung bases with the largest nodular opacity in the left lower lobe measuring 1.4 x 0.9 cm. This nodular lesion may warrant correlation with nuclear medicine PET study to assess for abnormal metabolic activity given its size and potential for neoplastic involvement. 5. Aortic Atherosclerosis (ICD10-I70.0). There are foci of coronary artery and iliac artery atherosclerotic calcification. 6. Slight thickening of urinary bladder wall. Advise correlation with urinalysis to assess for  potential degree of cystitis.   ASSESSMENT  Mr. Melody is a 75 y.o. 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  The patient is seen today for evaluation and management of:  1. Duodenal adenoma   2. Anemia, unspecified type   3. Abnormal findings on esophagogastroduodenoscopy (EGD)    The patient is hemodynamically and clinically stable at this time.  Based  upon the description and endoscopic pictures I do feel that it is reasonable to pursue an Advanced Polypectomy attempt of the polyp/lesion.  We discussed some of the techniques of advanced polypectomy which include Endoscopic Mucosal Resection, OVESCO Full-Thickness Resection, Endorotor Morcellation, and Tissue Ablation via Fulguration.  We also reviewed images of typical techniques as noted above.  The risks and benefits of endoscopic evaluation were discussed with the patient; these include but are not limited to the risk of perforation, infection, bleeding, missed lesions, lack of diagnosis, severe illness requiring hospitalization, as well as anesthesia and sedation related illnesses.  During attempts at advanced resection, the risks of bleeding and perforation/leak are increased as opposed to diagnostic and screening procedures, and that was discussed with the patient as well.   In addition, I explained that with the possible need for piecemeal resection, subsequent short-interval endoscopic evaluation for follow up and potential retreatment of the lesion/area may be necessary.  I did offer, a referral to surgery in order for patient to have opportunity to discuss surgical management/intervention prior to finalizing decision for attempt at endoscopic removal, however, the patient deferred on this.  If, after attempt at removal of the polyp/lesion, it is found that the patient has a complication or that an invasive  lesion or malignant lesion is found, or that the polyp/lesion continues to recur, the patient is aware and understands that surgery may still be indicated/required.  All patient questions were answered, to the best of my ability, and the patient agrees to the aforementioned plan of action with follow-up as indicated.   PLAN  Preprocedural labs as outlined below Proceed with scheduling EGD/EUS with EMR attempt of TVA of the D3 region Iron indices to evaluate overall clinical status and potential need for further Iron repletion   Orders Placed This Encounter  Procedures  . Procedural/ Surgical Case Request: UPPER ESOPHAGEAL ENDOSCOPIC ULTRASOUND (EUS), ESOPHAGOGASTRODUODENOSCOPY (EGD) WITH PROPOFOL, ENDOSCOPIC MUCOSAL RESECTION  . CBC  . Basic Metabolic Panel (BMET)  . IBC + Ferritin  . B12  . Folate  . INR/PT  . Ambulatory referral to Gastroenterology    New Prescriptions   No medications on file   Modified Medications   No medications on file    Planned Follow Up No follow-ups on file.   Total Time in Face-to-Face and in Coordination of Care for patient including independent/personal interpretation/review of prior testing, medical history, examination, medication adjustment, communicating results with the patient directly, and documentation with the EHR is 45 minutes.   Justice Britain, MD [facility] Advanced [facility]oscopy Office # [phone]

## 2020-11-10 ENCOUNTER — Encounter (HOSPITAL_COMMUNITY)
Admission: RE | Admit: 2020-11-10 | Discharge: 2020-11-10 | Disposition: A | Discharge status: Home / Self Care | Payer: Medicare HMO | Source: Ambulatory Visit | Attending: Nephrology | Admitting: Nephrology

## 2020-11-10 ENCOUNTER — Other Ambulatory Visit: Payer: Self-pay

## 2020-11-10 DIAGNOSIS — D631 Anemia in chronic kidney disease: Secondary | ICD-10-CM | POA: Insufficient documentation

## 2020-11-10 DIAGNOSIS — R198 Other specified symptoms and signs involving the digestive system and abdomen: Secondary | ICD-10-CM | POA: Insufficient documentation

## 2020-11-10 DIAGNOSIS — D132 Benign neoplasm of duodenum: Secondary | ICD-10-CM | POA: Insufficient documentation

## 2020-11-10 DIAGNOSIS — N184 Chronic kidney disease, stage 4 (severe): Secondary | ICD-10-CM | POA: Diagnosis present

## 2020-11-10 LAB — BASIC METABOLIC PANEL
Anion gap: 8 (ref 5–15)
BUN: 18 mg/dL (ref 8–23)
CO2: 32 mmol/L (ref 22–32)
Calcium: 8.7 mg/dL — ABNORMAL LOW (ref 8.9–10.3)
Chloride: 98 mmol/L (ref 98–111)
Creatinine, Ser: 1.78 mg/dL — ABNORMAL HIGH (ref 0.61–1.24)
GFR, Estimated: 39 mL/min — ABNORMAL LOW (ref >60)
Glucose, Bld: 109 mg/dL — ABNORMAL HIGH (ref 70–99)
Potassium: 3.5 mmol/L (ref 3.5–5.1)
Sodium: 138 mmol/L (ref 135–145)

## 2020-11-10 LAB — CBC WITH DIFFERENTIAL/PLATELET
Abs Immature Granulocytes: 0.02 10*3/uL (ref 0.00–0.07)
Basophils Absolute: 0 10*3/uL (ref 0.0–0.1)
Basophils Relative: 0 %
Eosinophils Absolute: 0.2 10*3/uL (ref 0.0–0.5)
Eosinophils Relative: 4 %
HCT: 28.2 % — ABNORMAL LOW (ref 39.0–52.0)
Hemoglobin: 9.3 g/dL — ABNORMAL LOW (ref 13.0–17.0)
Immature Granulocytes: 0 %
Lymphocytes Relative: 28 %
Lymphs Abs: 1.3 10*3/uL (ref 0.7–4.0)
MCH: 27.7 pg (ref 26.0–34.0)
MCHC: 33 g/dL (ref 30.0–36.0)
MCV: 83.9 fL (ref 80.0–100.0)
Monocytes Absolute: 0.5 10*3/uL (ref 0.1–1.0)
Monocytes Relative: 11 %
Neutro Abs: 2.7 10*3/uL (ref 1.7–7.7)
Neutrophils Relative %: 57 %
Platelets: 106 10*3/uL — ABNORMAL LOW (ref 150–400)
RBC: 3.36 MIL/uL — ABNORMAL LOW (ref 4.22–5.81)
RDW: 13.2 % (ref 11.5–15.5)
WBC: 4.8 10*3/uL (ref 4.0–10.5)
nRBC: 0 % (ref 0.0–0.2)

## 2020-11-10 LAB — VITAMIN B12: Vitamin B-12: 2480 pg/mL — ABNORMAL HIGH (ref 180–914)

## 2020-11-10 LAB — FOLATE: Folate: 40.2 ng/mL (ref >5.9)

## 2020-11-10 LAB — PROTIME-INR
INR: 1 (ref 0.8–1.2)
Prothrombin Time: 13 seconds (ref 11.4–15.2)

## 2020-11-10 LAB — FERRITIN: Ferritin: 83 ng/mL (ref 24–336)

## 2020-11-10 LAB — POCT HEMOGLOBIN-HEMACUE: Hemoglobin: 9.1 g/dL — ABNORMAL LOW (ref 13.0–17.0)

## 2020-11-10 MED ORDER — EPOETIN ALFA-EPBX 3000 UNIT/ML IJ SOLN
INTRAMUSCULAR | Status: AC
  Filled 2020-11-10: qty 1

## 2020-11-10 MED ORDER — EPOETIN ALFA-EPBX 3000 UNIT/ML IJ SOLN
3000.0000 [IU] | Freq: Once | INTRAMUSCULAR | Status: AC
  Administered 2020-11-10: 3000 [IU] via SUBCUTANEOUS

## 2020-11-19 DIAGNOSIS — G4733 Obstructive sleep apnea (adult) (pediatric): Secondary | ICD-10-CM | POA: Diagnosis not present

## 2020-11-20 DIAGNOSIS — G4733 Obstructive sleep apnea (adult) (pediatric): Secondary | ICD-10-CM | POA: Diagnosis not present

## 2020-11-20 DIAGNOSIS — I509 Heart failure, unspecified: Secondary | ICD-10-CM | POA: Diagnosis not present

## 2020-11-20 DIAGNOSIS — G4736 Sleep related hypoventilation in conditions classified elsewhere: Secondary | ICD-10-CM | POA: Diagnosis not present

## 2020-11-20 DIAGNOSIS — J449 Chronic obstructive pulmonary disease, unspecified: Secondary | ICD-10-CM | POA: Diagnosis not present

## 2020-11-24 ENCOUNTER — Encounter (HOSPITAL_COMMUNITY)
Admission: RE | Admit: 2020-11-24 | Discharge: 2020-11-24 | Disposition: A | Discharge status: Home / Self Care | Payer: Medicare HMO | Source: Ambulatory Visit | Attending: Nephrology | Admitting: Nephrology

## 2020-11-24 ENCOUNTER — Other Ambulatory Visit: Payer: Self-pay

## 2020-11-24 ENCOUNTER — Encounter (HOSPITAL_COMMUNITY): Payer: Self-pay

## 2020-11-24 DIAGNOSIS — E669 Obesity, unspecified: Secondary | ICD-10-CM | POA: Diagnosis not present

## 2020-11-24 DIAGNOSIS — I951 Orthostatic hypotension: Secondary | ICD-10-CM | POA: Diagnosis not present

## 2020-11-24 DIAGNOSIS — E785 Hyperlipidemia, unspecified: Secondary | ICD-10-CM | POA: Diagnosis not present

## 2020-11-24 DIAGNOSIS — M109 Gout, unspecified: Secondary | ICD-10-CM | POA: Diagnosis not present

## 2020-11-24 DIAGNOSIS — K219 Gastro-esophageal reflux disease without esophagitis: Secondary | ICD-10-CM | POA: Diagnosis not present

## 2020-11-24 DIAGNOSIS — E119 Type 2 diabetes mellitus without complications: Secondary | ICD-10-CM | POA: Diagnosis not present

## 2020-11-24 DIAGNOSIS — D631 Anemia in chronic kidney disease: Secondary | ICD-10-CM | POA: Diagnosis not present

## 2020-11-24 DIAGNOSIS — N184 Chronic kidney disease, stage 4 (severe): Secondary | ICD-10-CM | POA: Diagnosis not present

## 2020-11-24 DIAGNOSIS — J449 Chronic obstructive pulmonary disease, unspecified: Secondary | ICD-10-CM | POA: Diagnosis not present

## 2020-11-24 DIAGNOSIS — I10 Essential (primary) hypertension: Secondary | ICD-10-CM | POA: Diagnosis not present

## 2020-11-24 DIAGNOSIS — D649 Anemia, unspecified: Secondary | ICD-10-CM | POA: Diagnosis not present

## 2020-11-24 DIAGNOSIS — J9611 Chronic respiratory failure with hypoxia: Secondary | ICD-10-CM | POA: Diagnosis not present

## 2020-11-24 LAB — POCT HEMOGLOBIN-HEMACUE: Hemoglobin: 9.4 g/dL — ABNORMAL LOW (ref 13.0–17.0)

## 2020-11-24 MED ORDER — EPOETIN ALFA-EPBX 3000 UNIT/ML IJ SOLN
3000.0000 [IU] | Freq: Once | INTRAMUSCULAR | Status: AC
  Administered 2020-11-24: 3000 [IU] via SUBCUTANEOUS
  Filled 2020-11-24: qty 1

## 2020-11-28 DIAGNOSIS — E1122 Type 2 diabetes mellitus with diabetic chronic kidney disease: Secondary | ICD-10-CM | POA: Diagnosis not present

## 2020-11-28 DIAGNOSIS — E7849 Other hyperlipidemia: Secondary | ICD-10-CM | POA: Diagnosis not present

## 2020-11-28 DIAGNOSIS — I129 Hypertensive chronic kidney disease with stage 1 through stage 4 chronic kidney disease, or unspecified chronic kidney disease: Secondary | ICD-10-CM | POA: Diagnosis not present

## 2020-11-28 DIAGNOSIS — N184 Chronic kidney disease, stage 4 (severe): Secondary | ICD-10-CM | POA: Diagnosis not present

## 2020-12-07 DIAGNOSIS — J449 Chronic obstructive pulmonary disease, unspecified: Secondary | ICD-10-CM | POA: Diagnosis not present

## 2020-12-08 ENCOUNTER — Encounter (HOSPITAL_COMMUNITY): Admission: RE | Admit: 2020-12-08 | Payer: Medicare HMO | Source: Ambulatory Visit

## 2020-12-08 ENCOUNTER — Encounter (HOSPITAL_COMMUNITY): Payer: Self-pay

## 2020-12-08 ENCOUNTER — Other Ambulatory Visit (HOSPITAL_COMMUNITY): Payer: Medicare HMO

## 2020-12-08 ENCOUNTER — Encounter (HOSPITAL_COMMUNITY)
Admission: RE | Admit: 2020-12-08 | Discharge: 2020-12-08 | Disposition: A | Discharge status: Home / Self Care | Payer: Medicare HMO | Source: Ambulatory Visit | Attending: Nephrology | Admitting: Nephrology

## 2020-12-08 ENCOUNTER — Other Ambulatory Visit: Payer: Self-pay

## 2020-12-08 DIAGNOSIS — D631 Anemia in chronic kidney disease: Secondary | ICD-10-CM | POA: Diagnosis not present

## 2020-12-08 DIAGNOSIS — N184 Chronic kidney disease, stage 4 (severe): Secondary | ICD-10-CM | POA: Diagnosis not present

## 2020-12-08 LAB — POCT HEMOGLOBIN-HEMACUE
Hemoglobin: 7.5 g/dL — ABNORMAL LOW (ref 13.0–17.0)
Hemoglobin: 7.6 g/dL — ABNORMAL LOW (ref 13.0–17.0)

## 2020-12-08 MED ORDER — EPOETIN ALFA-EPBX 3000 UNIT/ML IJ SOLN
3000.0000 [IU] | Freq: Once | INTRAMUSCULAR | Status: AC
  Administered 2020-12-08: 3000 [IU] via SUBCUTANEOUS

## 2020-12-08 MED ORDER — EPOETIN ALFA-EPBX 3000 UNIT/ML IJ SOLN
INTRAMUSCULAR | Status: AC
  Filled 2020-12-08: qty 1

## 2020-12-10 ENCOUNTER — Other Ambulatory Visit (HOSPITAL_COMMUNITY)
Admission: RE | Admit: 2020-12-10 | Discharge: 2020-12-10 | Disposition: A | Discharge status: Home / Self Care | Payer: Medicare HMO | Source: Ambulatory Visit | Attending: Gastroenterology | Admitting: Gastroenterology

## 2020-12-10 DIAGNOSIS — Z20822 Contact with and (suspected) exposure to covid-19: Secondary | ICD-10-CM | POA: Insufficient documentation

## 2020-12-10 DIAGNOSIS — Z01812 Encounter for preprocedural laboratory examination: Secondary | ICD-10-CM | POA: Insufficient documentation

## 2020-12-10 LAB — SARS CORONAVIRUS 2 (TAT 6-24 HRS): SARS Coronavirus 2: NEGATIVE

## 2020-12-12 NOTE — Anesthesia Preprocedure Evaluation (Addendum)
Anesthesia Evaluation  Patient identified by MRN, date of birth, ID band Patient awake    Reviewed: Allergy & Precautions, NPO status , Patient's Chart, lab work & pertinent test results  History of Anesthesia Complications Negative for: history of anesthetic complications  Airway Mallampati: II       Dental no notable dental hx. (+) Dental Advisory Given   Pulmonary sleep apnea , COPD,  COPD inhaler, former smoker,    Pulmonary exam normal        Cardiovascular hypertension, Pt. on medications and Pt. on home beta blockers  Rhythm:Irregular Rate:Normal  Echo 12/20 Normal ef. Valves nl   Neuro/Psych negative neurological ROS  negative psych ROS   GI/Hepatic GERD  ,(+)     substance abuse  alcohol use,   Endo/Other  negative endocrine ROS  Renal/GU negative Renal ROS     Musculoskeletal  (+) Arthritis , Osteoarthritis,    Abdominal   Peds  Hematology  (+) anemia ,   Anesthesia Other Findings   Reproductive/Obstetrics                            Anesthesia Physical  Anesthesia Plan  ASA: III  Anesthesia Plan: MAC   Post-op Pain Management:    Induction: Intravenous  PONV Risk Score and Plan: 1 and Ondansetron and Propofol infusion  Airway Management Planned: Natural Airway  Additional Equipment:   Intra-op Plan:   Post-operative Plan:   Informed Consent: I have reviewed the patients History and Physical, chart, labs and discussed the procedure including the risks, benefits and alternatives for the proposed anesthesia with the patient or authorized representative who has indicated his/her understanding and acceptance.     Dental advisory given  Plan Discussed with: Anesthesiologist and CRNA  Anesthesia Plan Comments:        Anesthesia Quick Evaluation

## 2020-12-14 ENCOUNTER — Encounter (HOSPITAL_COMMUNITY)
Admission: RE | Disposition: A | Discharge status: Home / Self Care | Payer: Self-pay | Source: Home / Self Care | Attending: Gastroenterology

## 2020-12-14 ENCOUNTER — Ambulatory Visit (HOSPITAL_COMMUNITY): Payer: Medicare HMO | Admitting: Anesthesiology

## 2020-12-14 ENCOUNTER — Encounter (HOSPITAL_COMMUNITY): Payer: Self-pay | Admitting: Gastroenterology

## 2020-12-14 ENCOUNTER — Ambulatory Visit (HOSPITAL_COMMUNITY)
Admission: RE | Admit: 2020-12-14 | Discharge: 2020-12-14 | Disposition: A | Discharge status: Home / Self Care | Payer: Medicare HMO | Attending: Gastroenterology | Admitting: Gastroenterology

## 2020-12-14 ENCOUNTER — Other Ambulatory Visit: Payer: Self-pay

## 2020-12-14 DIAGNOSIS — Z86010 Personal history of colonic polyps: Secondary | ICD-10-CM | POA: Diagnosis not present

## 2020-12-14 DIAGNOSIS — K259 Gastric ulcer, unspecified as acute or chronic, without hemorrhage or perforation: Secondary | ICD-10-CM | POA: Diagnosis not present

## 2020-12-14 DIAGNOSIS — K449 Diaphragmatic hernia without obstruction or gangrene: Secondary | ICD-10-CM | POA: Diagnosis not present

## 2020-12-14 DIAGNOSIS — D132 Benign neoplasm of duodenum: Secondary | ICD-10-CM | POA: Diagnosis not present

## 2020-12-14 DIAGNOSIS — D509 Iron deficiency anemia, unspecified: Secondary | ICD-10-CM | POA: Insufficient documentation

## 2020-12-14 DIAGNOSIS — K317 Polyp of stomach and duodenum: Secondary | ICD-10-CM | POA: Insufficient documentation

## 2020-12-14 DIAGNOSIS — Z833 Family history of diabetes mellitus: Secondary | ICD-10-CM | POA: Insufficient documentation

## 2020-12-14 DIAGNOSIS — D649 Anemia, unspecified: Secondary | ICD-10-CM

## 2020-12-14 DIAGNOSIS — Z87891 Personal history of nicotine dependence: Secondary | ICD-10-CM | POA: Diagnosis not present

## 2020-12-14 DIAGNOSIS — Z86711 Personal history of pulmonary embolism: Secondary | ICD-10-CM | POA: Insufficient documentation

## 2020-12-14 DIAGNOSIS — K3189 Other diseases of stomach and duodenum: Secondary | ICD-10-CM | POA: Diagnosis not present

## 2020-12-14 DIAGNOSIS — Z8249 Family history of ischemic heart disease and other diseases of the circulatory system: Secondary | ICD-10-CM | POA: Insufficient documentation

## 2020-12-14 DIAGNOSIS — R198 Other specified symptoms and signs involving the digestive system and abdomen: Secondary | ICD-10-CM

## 2020-12-14 DIAGNOSIS — N189 Chronic kidney disease, unspecified: Secondary | ICD-10-CM | POA: Insufficient documentation

## 2020-12-14 DIAGNOSIS — K219 Gastro-esophageal reflux disease without esophagitis: Secondary | ICD-10-CM | POA: Insufficient documentation

## 2020-12-14 DIAGNOSIS — K222 Esophageal obstruction: Secondary | ICD-10-CM | POA: Diagnosis not present

## 2020-12-14 DIAGNOSIS — I129 Hypertensive chronic kidney disease with stage 1 through stage 4 chronic kidney disease, or unspecified chronic kidney disease: Secondary | ICD-10-CM | POA: Diagnosis not present

## 2020-12-14 DIAGNOSIS — K2970 Gastritis, unspecified, without bleeding: Secondary | ICD-10-CM | POA: Diagnosis not present

## 2020-12-14 HISTORY — PX: SUBMUCOSAL TATTOO INJECTION: SHX6856

## 2020-12-14 HISTORY — PX: POLYPECTOMY: SHX5525

## 2020-12-14 HISTORY — PX: ESOPHAGOGASTRODUODENOSCOPY (EGD) WITH PROPOFOL: SHX5813

## 2020-12-14 HISTORY — PX: HEMOSTASIS CONTROL: SHX6838

## 2020-12-14 HISTORY — PX: SCLEROTHERAPY: SHX6841

## 2020-12-14 HISTORY — PX: HEMOSTASIS CLIP PLACEMENT: SHX6857

## 2020-12-14 SURGERY — ESOPHAGOGASTRODUODENOSCOPY (EGD) WITH PROPOFOL
Anesthesia: Monitor Anesthesia Care

## 2020-12-14 MED ORDER — ONDANSETRON HCL 4 MG/2ML IJ SOLN
INTRAMUSCULAR | Status: DC | PRN
  Administered 2020-12-14: 4 mg via INTRAVENOUS

## 2020-12-14 MED ORDER — PROPOFOL 500 MG/50ML IV EMUL
INTRAVENOUS | Status: DC | PRN
  Administered 2020-12-14: 150 ug/kg/min via INTRAVENOUS

## 2020-12-14 MED ORDER — LIDOCAINE 2% (20 MG/ML) 5 ML SYRINGE
INTRAMUSCULAR | Status: DC | PRN
  Administered 2020-12-14: 60 mg via INTRAVENOUS

## 2020-12-14 MED ORDER — GLUCAGON HCL RDNA (DIAGNOSTIC) 1 MG IJ SOLR
INTRAMUSCULAR | Status: DC | PRN
  Administered 2020-12-14 (×6): .25 mg via INTRAVENOUS

## 2020-12-14 MED ORDER — PROPOFOL 500 MG/50ML IV EMUL
INTRAVENOUS | Status: AC
  Filled 2020-12-14: qty 50

## 2020-12-14 MED ORDER — SPOT INK MARKER SYRINGE KIT
PACK | SUBMUCOSAL | Status: DC | PRN
  Administered 2020-12-14: 4 mL via SUBMUCOSAL

## 2020-12-14 MED ORDER — IPRATROPIUM-ALBUTEROL 0.5-2.5 (3) MG/3ML IN SOLN
3.0000 mL | Freq: Once | RESPIRATORY_TRACT | Status: AC
  Administered 2020-12-14: 3 mL via RESPIRATORY_TRACT

## 2020-12-14 MED ORDER — PROPOFOL 10 MG/ML IV BOLUS
INTRAVENOUS | Status: AC
  Filled 2020-12-14: qty 20

## 2020-12-14 MED ORDER — SPOT INK MARKER SYRINGE KIT
PACK | SUBMUCOSAL | Status: AC
  Filled 2020-12-14: qty 5

## 2020-12-14 MED ORDER — IPRATROPIUM-ALBUTEROL 0.5-2.5 (3) MG/3ML IN SOLN
RESPIRATORY_TRACT | Status: AC
  Filled 2020-12-14: qty 3

## 2020-12-14 MED ORDER — SODIUM CHLORIDE (PF) 0.9 % IJ SOLN
PREFILLED_SYRINGE | INTRAMUSCULAR | Status: DC | PRN
  Administered 2020-12-14: 5 mL

## 2020-12-14 MED ORDER — LACTATED RINGERS IV SOLN: INTRAVENOUS | Status: DC | PRN

## 2020-12-14 MED ORDER — EPINEPHRINE 1 MG/10ML IJ SOSY
PREFILLED_SYRINGE | INTRAMUSCULAR | Status: AC
  Filled 2020-12-14: qty 10

## 2020-12-14 MED ORDER — PANTOPRAZOLE SODIUM 40 MG PO TBEC: 40.0000 mg | DELAYED_RELEASE_TABLET | Freq: Two times a day (BID) | ORAL | 6 refills | Status: DC

## 2020-12-14 MED ORDER — PROPOFOL 10 MG/ML IV BOLUS
INTRAVENOUS | Status: DC | PRN
  Administered 2020-12-14 (×2): 20 mg via INTRAVENOUS

## 2020-12-14 MED ORDER — SODIUM CHLORIDE 0.9 % IV SOLN: INTRAVENOUS | Status: DC

## 2020-12-14 SURGICAL SUPPLY — 15 items

## 2020-12-14 NOTE — Anesthesia Postprocedure Evaluation (Signed)
Anesthesia Post Note  Patient: Andrew Adams  Procedure(s) Performed: ESOPHAGOGASTRODUODENOSCOPY (EGD) WITH PROPOFOL (N/A ) HEMOSTASIS CLIP PLACEMENT SCLEROTHERAPY POLYPECTOMY SUBMUCOSAL TATTOO INJECTION HEMOSTASIS CONTROL     Patient location during evaluation: Endoscopy Anesthesia Type: MAC Level of consciousness: awake and alert Pain management: pain level controlled Vital Signs Assessment: post-procedure vital signs reviewed and stable Respiratory status: spontaneous breathing and respiratory function stable Cardiovascular status: stable Postop Assessment: no apparent nausea or vomiting Anesthetic complications: no   No complications documented.  Last Vitals:  Vitals:   12/14/20 1350 12/14/20 1400  BP: (!) 146/84 (!) 154/91  Pulse: 88 84  Resp: (!) 23 (!) 24  Temp:    SpO2: 90% 94%    Last Pain:  Vitals:   12/14/20 1400  TempSrc:   PainSc: 0-No pain                 Benetta Maclaren DANIEL

## 2020-12-14 NOTE — H&P (Signed)
GASTROENTEROLOGY PROCEDURE H&P NOTE   Primary Care Physician: Lavella Lemons, PA  HPI: Andrew Adams is a 75 y.o. 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  Past Medical History:  Diagnosis Date  . Anemia   . Borderline diabetic   . Chronic kidney disease   . Duodenal adenoma    2021  . GERD (gastroesophageal reflux disease)   . Gout   . Gouty arthritis   . H/O small bowel obstruction   . History of back surgery   . Hx of adenomatous colonic polyps    2021  . Hyperlipidemia   . Hypertension   . PE (pulmonary embolism)   . Sleep apnea    Past Surgical History:  Procedure Laterality Date  . ABDOMINAL SURGERY  2103   Smal bowel obstruction   . ABDOMINAL SURGERY  1981   Perforated large intestine  . back     Lower back  . BIOPSY  08/28/2020   Procedure: BIOPSY;  Surgeon: Harvel Quale, MD;  [facility];  Service: Gastroenterology;;  . CATARACT EXTRACTION W/PHACO Right 01/06/2020   Procedure: CATARACT EXTRACTION PHACO AND INTRAOCULAR LENS PLACEMENT (IOC) (CDE: 6.48);  Surgeon: [name], MD;  Location: [facility];  Service: Ophthalmology;  Laterality: Right;  . CATARACT EXTRACTION W/PHACO Left 01/20/2020   Procedure: CATARACT EXTRACTION PHACO AND INTRAOCULAR LENS PLACEMENT (IOC);  Surgeon: [name], MD;  Location: [facility];  Service: Ophthalmology;  Laterality: Left;  CDE: 5.86  . COLONOSCOPY WITH PROPOFOL N/A 08/28/2020   Procedure: COLONOSCOPY WITH PROPOFOL;  Surgeon: Harvel Quale, MD;  [facility];  Service: Gastroenterology;  Laterality: N/A;  230  . ESOPHAGOGASTRODUODENOSCOPY (EGD) WITH PROPOFOL N/A 08/28/2020   Procedure: ESOPHAGOGASTRODUODENOSCOPY (EGD) WITH PROPOFOL;  Surgeon: Harvel Quale, MD;  [facility];  Service: Gastroenterology;  Laterality: N/A;  . GIVENS CAPSULE STUDY N/A 09/10/2020   Procedure: GIVENS CAPSULE STUDY;  Surgeon: Harvel Quale,  MD;  [facility];  Service: Gastroenterology;  Laterality: N/A;  730  . HERNIA REPAIR     incisional  . POLYPECTOMY  08/28/2020   Procedure: POLYPECTOMY;  Surgeon: Harvel Quale, MD;  [facility];  Service: Gastroenterology;;   Current Facility-Administered Medications  Medication Dose Route Frequency Provider Last Rate Last Admin  . 0.9 %  sodium chloride infusion   Intravenous Continuous Mansouraty, Telford Nab., MD       No Known Allergies Family History  Problem Relation Age of Onset  . Hypertension Sister   . Diabetes Sister   . Diabetes Sister   . Hypertension Mother   . Diabetes Mother   . Hypertension Father   . Ulcers Father   . Colon cancer Neg Hx   . Esophageal cancer Neg Hx   . Stomach cancer Neg Hx   . Inflammatory bowel disease Neg Hx   . Liver disease Neg Hx   . Pancreatic cancer Neg Hx   . Rectal cancer Neg Hx    Social History   Socioeconomic History  . Marital status: Married    Spouse name: Not on file  . Number of children: 1  . Years of education: Not on file  . Highest education level: Not on file  Occupational History  . Occupation: Finisher  Tobacco Use  . Smoking status: Former Smoker    Packs/day: 1.00    Years: 38.00    Pack years: 38.00    Types:  Cigarettes    Quit date: 02/07/1995    Years since quitting: 25.8  . Smokeless tobacco: Never Used  Vaping Use  . Vaping Use: Never used  Substance and Sexual Activity  . Alcohol use: Yes    Comment: occasional   . Drug use: No  . Sexual activity: Yes  Other Topics Concern  . Not on file  Social History Narrative   Lives with wife.     Social Determinants of Health   Financial Resource Strain: Not on file  Food Insecurity: Not on file  Transportation Needs: Not on file  Physical Activity: Not on file  Stress: Not on file  Social Connections: Not on file  Intimate Partner Violence: Not on file    Physical Exam: Vital signs in last 24  hours: Temp:  [98.2 F (36.8 C)] 98.2 F (36.8 C) (02/14 0825) Pulse Rate:  [75] 75 (02/14 0825) Resp:  [19] 19 (02/14 0825) BP: (169)/(86) 169/86 (02/14 0825) SpO2:  [100 %] 100 % (02/14 0825) Weight:  [90.7 kg] 90.7 kg (02/14 0825)   GEN: NAD EYE: Sclerae anicteric ENT: MMM CV: Non-tachycardic GI: Soft, NT/ND NEURO:  Alert & Oriented x 3  Lab Results: No results for input(s): WBC, HGB, HCT, PLT in the last 72 hours. BMET No results for input(s): NA, K, CL, CO2, GLUCOSE, BUN, CREATININE, CALCIUM in the last 72 hours. LFT No results for input(s): PROT, ALBUMIN, AST, ALT, ALKPHOS, BILITOT, BILIDIR, IBILI in the last 72 hours. PT/INR No results for input(s): LABPROT, INR in the last 72 hours.   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  The risks of an EUS including intestinal perforation, bleeding, infection, aspiration, and medication effects were discussed as was the possibility it may not give a definitive diagnosis if a biopsy is performed.  When a biopsy of the pancreas is done as part of the EUS, there is an additional risk of pancreatitis at the rate of about 1-2%.  It was explained that procedure related pancreatitis is typically mild, although it can be severe and even life threatening, which is why we do not perform random pancreatic biopsies and only biopsy a lesion/area we feel is concerning enough to warrant the risk.  The risks and benefits of endoscopic evaluation were discussed with the patient; these include but are not limited to the risk of perforation, infection, bleeding, missed lesions, lack of diagnosis, severe illness requiring hospitalization, as well as anesthesia and sedation related illnesses.  The patient is agreeable to proceed.    Justice Britain, MD [facility] Advanced Endoscopy Office # [phone]

## 2020-12-14 NOTE — Transfer of Care (Signed)
Immediate Anesthesia Transfer of Care Note  Patient: Andrew Adams  Procedure(s) Performed: ESOPHAGOGASTRODUODENOSCOPY (EGD) WITH PROPOFOL (N/A ) UPPER ESOPHAGEAL ENDOSCOPIC ULTRASOUND (EUS) (N/A ) ENDOSCOPIC MUCOSAL RESECTION (N/A ) HEMOSTASIS CLIP PLACEMENT SCLEROTHERAPY POLYPECTOMY SUBMUCOSAL TATTOO INJECTION  Patient Location: PACU and Endoscopy Unit  Anesthesia Type:MAC  Level of Consciousness: awake, drowsy and responds to stimulation  Airway & Oxygen Therapy: Patient Spontanous Breathing and Patient connected to face mask oxygen  Post-op Assessment: Report given to RN and Post -op Vital signs reviewed and stable  Post vital signs: Reviewed and stable  Last Vitals:  Vitals Value Taken Time  BP 135/76 12/14/20 1225  Temp    Pulse 88 12/14/20 1226  Resp 18 12/14/20 1226  SpO2 99 % 12/14/20 1226  Vitals shown include unvalidated device data.  Last Pain:  Vitals:   12/14/20 0825  TempSrc: Oral  PainSc: 0-No pain         Complications: No complications documented.

## 2020-12-14 NOTE — Anesthesia Procedure Notes (Signed)
Procedure Name: MAC Date/Time: 12/14/2020 9:23 AM Performed by: Lollie Sails, CRNA Pre-anesthesia Checklist: Patient identified, Emergency Drugs available, Suction available, Patient being monitored and Timeout performed Oxygen Delivery Method: Simple face mask Preoxygenation: POM used.

## 2020-12-14 NOTE — Op Note (Signed)
[facility] Patient Name: Andrew Adams Procedure Date: 12/14/2020 MRN: [id number] Attending MD: Justice Britain , MD Date of Birth: June 25, 1946 CSN: [id number] Age: 75 Admit Type: Outpatient Procedure:                Upper GI endoscopy Indications:              Therapeutic procedure, Iron deficiency anemia,                            Polyps in the duodenum, For therapy of polyps in                            the duodenum (TVA), Follow-up of gastric polyps Providers:                Justice Britain, MD, Kary Kos RN, RN, Cletis Athens, Technician, [place] [address]. Zenia Resides CRNA, CRNA Referring MD:             [name], [place] Luciana Axe PA, PA Medicines:                Monitored Anesthesia Care Complications:            No immediate complications. Estimated Blood Loss:     Estimated blood loss was minimal. Procedure:                Pre-Anesthesia Assessment:                           - Prior to the procedure, a History and Physical                            was performed, and patient medications and                            allergies were reviewed. The patient's tolerance of                            previous anesthesia was also reviewed. The risks                            and benefits of the procedure and the sedation                            options and risks were discussed with the patient.                            All questions were answered, and informed consent                            was obtained. Prior Anticoagulants: The patient has                            taken no previous anticoagulant or antiplatelet  agents. ASA Grade Assessment: III - A patient with                            severe systemic disease. After reviewing the risks                            and benefits, the patient was deemed in                            satisfactory condition to undergo the procedure.                            After obtaining informed consent, the endoscope was                            passed under direct vision. Throughout the                            procedure, the patient's blood pressure, pulse, and                            oxygen saturations were monitored continuously. The                            GIF-H190 ([id number] Olympus gastroscope was                            introduced through the mouth, and advanced to the                            second part of duodenum. The PCF-H190DL ([id number])                            Olympus pediatric colonscope was introduced through                            the mouth, and advanced to the proximal jejunum.                            The upper GI endoscopy was unusually difficult.                            Successful completion of the procedure was aided by                            performing the maneuvers documented (below) in this                            report. The patient tolerated the procedure. Scope In: Scope Out: Findings:      No gross lesions were noted in the entire esophagus.      The Z-line was regular and was found 40 cm from the incisors.      A non-obstructing Schatzki ring was found at the gastroesophageal  junction.      A 2 cm hiatal hernia was present.      Multiple 5 to 15 mm semi-sessile polyps with three of these showing       signs of active oozing or recent bleeding stigmata found in the gastric       cardia, in the gastric fundus, in the gastric body and in the gastric       antrum. Preparations were made for mucosal resection. NBI Imaging and       White light endoscopy was done to demarcate the borders of the lesion.       Orise gel was injected to raise the lesion. Snare mucosal resection was       performed. Resection and retrieval were complete. To prevent bleeding       after mucosal resection, two hemostatic clips were successfully placed       (MR conditional) to the cardia resection site. There  was no bleeding at       the end of the procedure. Preparations were made for mucosal resection.       NBI imaging and White-light endoscopy was done to demarcate the borders       of the lesion. Orise gel was injected to raise the lesion. Snare mucosal       resection was performed. Resection and retrieval were complete. To       prevent bleeding after mucosal resection, one hemostatic clip was       successfully placed (MR conditional) to the antral resection site. There       was no bleeding at the end of the procedure. Preparations were made for       mucosal resection. NBI imaging and White-light endoscopy was done to       demarcate the borders of the lesion. Orise gel was injected to raise the       lesion. Snare mucosal resection was performed. Resection and retrieval       were complete. To prevent bleeding after mucosal resection, one       hemostatic clip was successfully placed (MR conditional) to the antral       resection site. There was no bleeding at the end of the procedure.      Patchy mildly erythematous mucosa was found in the entire examined       stomach.      A single 5 mm nodule was found in the duodenal bulb.      No gross lesions were noted in the second portion of the duodenum.      A single greater than 50 mm pedunculated and semi-sessile polyp with       evidence of active oozing/bleeding was found in the third portion of the       duodenum. Preparations were made for mucosal resection. NBI imaging and       White-light endoscopy was done to demarcate the borders of the lesion       but this was difficult due to the size completely, but I could visualize       at one point a portion of base that was pedunculated. A 1:100,000       solution of epinephrine was injected to raise the lesion (5 mL total) to       the area of the stalk. Using a Polyloop, I gently maneuvered this over       the polyp with water irrigation and felt that we were in decent  positioning, but could not guarantee it was completely over the entire       polyp. I then proceeded with a piecemeal resection, so that I would not       snare the polyloop that would likely lead to more bleeding. Eventually I       was able to see that polyp was also semi-sessile. I then used Orise to       lift the region that was noted to be semi-sessile. I proceeded with       continued piecemeal resection. The polyloop became undone and there was       minimal oozing that I was able to stop with directed snare tip       coagulation. I then did a STSC to the resection margin. At the end of       the procedure, it was felt that resection and retrieval were complete.       To prevent bleeding after mucosal resection, eight hemostatic clips were       successfully placed (MR conditional). There was no bleeding at the end       of the procedure. Area proximal to this resection was tattooed with an       injection of Spot (carbon black).      One 10 mm sessile polyp with no bleeding was found in the fourth portion       of the duodenum. The polyp was removed with a cold snare. Resection and       retrieval were complete.      The examined proximal jejunum was normal. Impression:               - No gross lesions in esophagus. Z-line regular, 40                            cm from the incisors. Non-obstructing Schatzki ring.                           - 2 cm hiatal hernia.                           - Multiple gastric polyps. 3 with evidence of                            oozing or recent oozing. Resected and retrieved via                            mucosal resection. Clips (MR conditional) were                            placed to resection sites.                           - Erythematous mucosa in the stomach.                           - Nodule found in the duodenum bulb.                           - No gross lesions in the second portion of the  duodenum.                            - A single large pedunculated/semi-sessile duodenal                            polyp was found in D3. Using a polylooop,                            epinephrine injection, orise injection, STSC (as                            documented above) this lesion was removed in                            piecemeal fashion. Resected and retrieved. Clips                            (MR conditional) were placed to decrease risk of                            bleeding post-intervention. Tattooed proximal to                            this area.                           - One duodenal polyp in D4. Resected and retrieved.                           - Normal examined proximal jejunum. Moderate Sedation:      Not Applicable - Patient had care per Anesthesia. Recommendation:           - The patient will be observed post-procedure,                            until all discharge criteria are met.                           - Discharge patient to home.                           - Patient has a contact number available for                            emergencies. The signs and symptoms of potential                            delayed complications were discussed with the                            patient. Return to normal activities tomorrow.                            Written discharge instructions were provided to the  patient.                           - Full liquid diet for 1 day. Soft diet tomorrow.                            Then may resume previous diet if doing well.                           - Hold Iron supplementation for 1-week to decrease                            missing active oozing/hemorrhage from use.                           - Increase Omeprazole to 40 mg twice daily for next                            76-month and then may decrease back to 40 mg daily.                           - No NSAIDs or Aspirin for next 2-weeks.                           - Observe  patient's clinical course.                           - Await pathology results.                           - Repeat upper endoscopy in 6 months for                            surveillance.                           - The findings and recommendations were discussed                            with the patient.                           - The findings and recommendations were discussed                            with the patient's family. Procedure Code(s):        --- Professional ---                           [phone] Esophagogastroduodenoscopy, flexible,                            transoral; with endoscopic mucosal resection Diagnosis Code(s):        --- Professional ---  K22.2, Esophageal obstruction                           K44.9, Diaphragmatic hernia without obstruction or                            gangrene                           K31.7, Polyp of stomach and duodenum                           K31.89, Other diseases of stomach and duodenum                           D50.9, Iron deficiency anemia, unspecified CPT copyright 2019 [employer]. All rights reserved. The codes documented in this report are preliminary and upon coder review may  be revised to meet current compliance requirements. Justice Britain, MD 12/14/2020 12:42:36 PM Number of Addenda: 0

## 2020-12-14 NOTE — Discharge Instructions (Signed)
YOU HAD AN ENDOSCOPIC PROCEDURE TODAY: Refer to the procedure report and other information in the discharge instructions given to you for any specific questions about what was found during the examination. If this information does not answer your questions, please call [facility] office at [phone] to clarify.   YOU SHOULD EXPECT: Some feelings of bloating in the abdomen. Passage of more gas than usual. Walking can help get rid of the air that was put into your GI tract during the procedure and reduce the bloating. If you had a lower endoscopy (such as a colonoscopy or flexible sigmoidoscopy) you may notice spotting of blood in your stool or on the toilet paper. Some abdominal soreness may be present for a day or two, also.  DIET: Your first meal following the procedure should be a light meal and then it is ok to progress to your normal diet. A half-sandwich or bowl of soup is an example of a good first meal. Heavy or fried foods are harder to digest and may make you feel nauseous or bloated. Drink plenty of fluids but you should avoid alcoholic beverages for 24 hours. If you had a esophageal dilation, please see attached instructions for diet.    ACTIVITY: Your care partner should take you home directly after the procedure. You should plan to take it easy, moving slowly for the rest of the day. You can resume normal activity the day after the procedure however YOU SHOULD NOT DRIVE, use power tools, machinery or perform tasks that involve climbing or major physical exertion for 24 hours (because of the sedation medicines used during the test).   SYMPTOMS TO REPORT IMMEDIATELY: A gastroenterologist can be reached at any hour. Please call [phone]  for any of the following symptoms:   Following upper endoscopy (EGD, EUS, ERCP, esophageal dilation) Vomiting of blood or coffee ground material  New, significant abdominal pain  New, significant chest pain or pain under the shoulder blades  Painful or  persistently difficult swallowing  New shortness of breath  Black, tarry-looking or red, bloody stools  FOLLOW UP:  If any biopsies were taken you will be contacted by phone or by letter within the next 1-3 weeks. Call [phone]  if you have not heard about the biopsies in 3 weeks.  Please also call with any specific questions about appointments or follow up tests.  

## 2020-12-15 ENCOUNTER — Encounter (HOSPITAL_COMMUNITY): Payer: Self-pay | Admitting: Gastroenterology

## 2020-12-15 LAB — SURGICAL PATHOLOGY

## 2020-12-16 ENCOUNTER — Encounter: Payer: Self-pay | Admitting: Gastroenterology

## 2020-12-20 DIAGNOSIS — G4733 Obstructive sleep apnea (adult) (pediatric): Secondary | ICD-10-CM | POA: Diagnosis not present

## 2020-12-21 ENCOUNTER — Other Ambulatory Visit: Payer: Self-pay

## 2020-12-21 ENCOUNTER — Other Ambulatory Visit (HOSPITAL_COMMUNITY)
Admission: RE | Admit: 2020-12-21 | Discharge: 2020-12-21 | Disposition: A | Discharge status: Home / Self Care | Payer: Medicare HMO | Source: Ambulatory Visit | Attending: Nephrology | Admitting: Nephrology

## 2020-12-21 DIAGNOSIS — J449 Chronic obstructive pulmonary disease, unspecified: Secondary | ICD-10-CM | POA: Diagnosis not present

## 2020-12-21 DIAGNOSIS — G4736 Sleep related hypoventilation in conditions classified elsewhere: Secondary | ICD-10-CM | POA: Diagnosis not present

## 2020-12-21 DIAGNOSIS — I129 Hypertensive chronic kidney disease with stage 1 through stage 4 chronic kidney disease, or unspecified chronic kidney disease: Secondary | ICD-10-CM | POA: Insufficient documentation

## 2020-12-21 DIAGNOSIS — E1122 Type 2 diabetes mellitus with diabetic chronic kidney disease: Secondary | ICD-10-CM | POA: Diagnosis not present

## 2020-12-21 DIAGNOSIS — G4733 Obstructive sleep apnea (adult) (pediatric): Secondary | ICD-10-CM | POA: Diagnosis not present

## 2020-12-21 DIAGNOSIS — I509 Heart failure, unspecified: Secondary | ICD-10-CM | POA: Diagnosis not present

## 2020-12-21 DIAGNOSIS — N189 Chronic kidney disease, unspecified: Secondary | ICD-10-CM | POA: Diagnosis not present

## 2020-12-21 DIAGNOSIS — I5032 Chronic diastolic (congestive) heart failure: Secondary | ICD-10-CM | POA: Diagnosis not present

## 2020-12-21 DIAGNOSIS — D631 Anemia in chronic kidney disease: Secondary | ICD-10-CM | POA: Diagnosis not present

## 2020-12-21 LAB — RENAL FUNCTION PANEL
Albumin: 3.3 g/dL — ABNORMAL LOW (ref 3.5–5.0)
Anion gap: 9 (ref 5–15)
BUN: 21 mg/dL (ref 8–23)
CO2: 29 mmol/L (ref 22–32)
Calcium: 8.7 mg/dL — ABNORMAL LOW (ref 8.9–10.3)
Chloride: 99 mmol/L (ref 98–111)
Creatinine, Ser: 1.93 mg/dL — ABNORMAL HIGH (ref 0.61–1.24)
GFR, Estimated: 35 mL/min — ABNORMAL LOW (ref >60)
Glucose, Bld: 160 mg/dL — ABNORMAL HIGH (ref 70–99)
Phosphorus: 3.8 mg/dL (ref 2.5–4.6)
Potassium: 3.8 mmol/L (ref 3.5–5.1)
Sodium: 137 mmol/L (ref 135–145)

## 2020-12-21 LAB — CBC
HCT: 24.2 % — ABNORMAL LOW (ref 39.0–52.0)
Hemoglobin: 7.8 g/dL — ABNORMAL LOW (ref 13.0–17.0)
MCH: 27.8 pg (ref 26.0–34.0)
MCHC: 32.2 g/dL (ref 30.0–36.0)
MCV: 86.1 fL (ref 80.0–100.0)
Platelets: 189 10*3/uL (ref 150–400)
RBC: 2.81 MIL/uL — ABNORMAL LOW (ref 4.22–5.81)
RDW: 13.8 % (ref 11.5–15.5)
WBC: 4.9 10*3/uL (ref 4.0–10.5)
nRBC: 0 % (ref 0.0–0.2)

## 2020-12-21 LAB — PROTEIN / CREATININE RATIO, URINE
Creatinine, Urine: 69.07 mg/dL
Protein Creatinine Ratio: 0.2 mg/mg{Cre} — ABNORMAL HIGH (ref 0.00–0.15)
Total Protein, Urine: 14 mg/dL

## 2020-12-22 ENCOUNTER — Other Ambulatory Visit: Payer: Self-pay

## 2020-12-22 ENCOUNTER — Encounter (HOSPITAL_COMMUNITY): Payer: Self-pay

## 2020-12-22 ENCOUNTER — Encounter (HOSPITAL_COMMUNITY)
Admission: RE | Admit: 2020-12-22 | Discharge: 2020-12-22 | Disposition: A | Discharge status: Home / Self Care | Payer: Medicare HMO | Source: Ambulatory Visit | Attending: Nephrology | Admitting: Nephrology

## 2020-12-22 DIAGNOSIS — D631 Anemia in chronic kidney disease: Secondary | ICD-10-CM | POA: Diagnosis not present

## 2020-12-22 DIAGNOSIS — N184 Chronic kidney disease, stage 4 (severe): Secondary | ICD-10-CM | POA: Diagnosis not present

## 2020-12-22 MED ORDER — EPOETIN ALFA-EPBX 3000 UNIT/ML IJ SOLN
3000.0000 [IU] | Freq: Once | INTRAMUSCULAR | Status: AC
  Administered 2020-12-22: 3000 [IU] via SUBCUTANEOUS
  Filled 2020-12-22: qty 1

## 2020-12-24 DIAGNOSIS — N189 Chronic kidney disease, unspecified: Secondary | ICD-10-CM | POA: Diagnosis not present

## 2020-12-24 DIAGNOSIS — I129 Hypertensive chronic kidney disease with stage 1 through stage 4 chronic kidney disease, or unspecified chronic kidney disease: Secondary | ICD-10-CM | POA: Diagnosis not present

## 2020-12-24 DIAGNOSIS — D631 Anemia in chronic kidney disease: Secondary | ICD-10-CM | POA: Diagnosis not present

## 2020-12-24 DIAGNOSIS — E1122 Type 2 diabetes mellitus with diabetic chronic kidney disease: Secondary | ICD-10-CM | POA: Diagnosis not present

## 2020-12-24 DIAGNOSIS — R809 Proteinuria, unspecified: Secondary | ICD-10-CM | POA: Diagnosis not present

## 2020-12-24 DIAGNOSIS — D508 Other iron deficiency anemias: Secondary | ICD-10-CM | POA: Diagnosis not present

## 2020-12-24 DIAGNOSIS — E1129 Type 2 diabetes mellitus with other diabetic kidney complication: Secondary | ICD-10-CM | POA: Diagnosis not present

## 2020-12-24 DIAGNOSIS — I5032 Chronic diastolic (congestive) heart failure: Secondary | ICD-10-CM | POA: Diagnosis not present

## 2020-12-25 ENCOUNTER — Other Ambulatory Visit (INDEPENDENT_AMBULATORY_CARE_PROVIDER_SITE_OTHER): Payer: Self-pay | Admitting: Gastroenterology

## 2020-12-25 DIAGNOSIS — D508 Other iron deficiency anemias: Secondary | ICD-10-CM

## 2020-12-25 NOTE — Telephone Encounter (Signed)
Last seen 06/04/2020 for Iron Deficiency Anemia Dr. Jenetta Downer

## 2020-12-28 DIAGNOSIS — N184 Chronic kidney disease, stage 4 (severe): Secondary | ICD-10-CM | POA: Diagnosis not present

## 2020-12-28 DIAGNOSIS — I129 Hypertensive chronic kidney disease with stage 1 through stage 4 chronic kidney disease, or unspecified chronic kidney disease: Secondary | ICD-10-CM | POA: Diagnosis not present

## 2020-12-28 DIAGNOSIS — E1122 Type 2 diabetes mellitus with diabetic chronic kidney disease: Secondary | ICD-10-CM | POA: Diagnosis not present

## 2020-12-28 DIAGNOSIS — E7849 Other hyperlipidemia: Secondary | ICD-10-CM | POA: Diagnosis not present

## 2020-12-30 ENCOUNTER — Other Ambulatory Visit: Payer: Self-pay | Admitting: Internal Medicine

## 2021-01-04 DIAGNOSIS — J449 Chronic obstructive pulmonary disease, unspecified: Secondary | ICD-10-CM | POA: Diagnosis not present

## 2021-01-05 ENCOUNTER — Encounter (HOSPITAL_COMMUNITY): Payer: Self-pay

## 2021-01-05 ENCOUNTER — Other Ambulatory Visit: Payer: Self-pay

## 2021-01-05 ENCOUNTER — Encounter (HOSPITAL_COMMUNITY)
Admission: RE | Admit: 2021-01-05 | Discharge: 2021-01-05 | Disposition: A | Discharge status: Home / Self Care | Payer: Medicare HMO | Source: Ambulatory Visit | Attending: Nephrology | Admitting: Nephrology

## 2021-01-05 DIAGNOSIS — N184 Chronic kidney disease, stage 4 (severe): Secondary | ICD-10-CM | POA: Insufficient documentation

## 2021-01-05 DIAGNOSIS — D631 Anemia in chronic kidney disease: Secondary | ICD-10-CM | POA: Insufficient documentation

## 2021-01-05 LAB — POCT HEMOGLOBIN-HEMACUE: Hemoglobin: 8.9 g/dL — ABNORMAL LOW (ref 13.0–17.0)

## 2021-01-05 MED ORDER — EPOETIN ALFA-EPBX 3000 UNIT/ML IJ SOLN: 3000.0000 [IU] | Freq: Once | INTRAMUSCULAR | Status: AC

## 2021-01-05 MED ORDER — EPOETIN ALFA-EPBX 3000 UNIT/ML IJ SOLN
INTRAMUSCULAR | Status: AC
  Administered 2021-01-05: 3000 [IU] via SUBCUTANEOUS
  Filled 2021-01-05: qty 1

## 2021-01-11 DIAGNOSIS — G4733 Obstructive sleep apnea (adult) (pediatric): Secondary | ICD-10-CM | POA: Diagnosis not present

## 2021-01-18 DIAGNOSIS — I509 Heart failure, unspecified: Secondary | ICD-10-CM | POA: Diagnosis not present

## 2021-01-18 DIAGNOSIS — J449 Chronic obstructive pulmonary disease, unspecified: Secondary | ICD-10-CM | POA: Diagnosis not present

## 2021-01-18 DIAGNOSIS — G4736 Sleep related hypoventilation in conditions classified elsewhere: Secondary | ICD-10-CM | POA: Diagnosis not present

## 2021-01-18 DIAGNOSIS — G4733 Obstructive sleep apnea (adult) (pediatric): Secondary | ICD-10-CM | POA: Diagnosis not present

## 2021-01-19 ENCOUNTER — Encounter (HOSPITAL_COMMUNITY)
Admission: RE | Admit: 2021-01-19 | Discharge: 2021-01-19 | Disposition: A | Discharge status: Home / Self Care | Payer: Medicare HMO | Source: Ambulatory Visit | Attending: Nephrology | Admitting: Nephrology

## 2021-01-19 ENCOUNTER — Encounter (HOSPITAL_COMMUNITY): Payer: Self-pay

## 2021-01-19 ENCOUNTER — Other Ambulatory Visit: Payer: Self-pay

## 2021-01-19 DIAGNOSIS — N184 Chronic kidney disease, stage 4 (severe): Secondary | ICD-10-CM | POA: Diagnosis not present

## 2021-01-19 DIAGNOSIS — D631 Anemia in chronic kidney disease: Secondary | ICD-10-CM | POA: Diagnosis not present

## 2021-01-19 LAB — POCT HEMOGLOBIN-HEMACUE: Hemoglobin: 10.8 g/dL — ABNORMAL LOW (ref 13.0–17.0)

## 2021-01-19 MED ORDER — EPOETIN ALFA-EPBX 3000 UNIT/ML IJ SOLN: 3000.0000 [IU] | Freq: Once | INTRAMUSCULAR | Status: DC

## 2021-01-26 ENCOUNTER — Ambulatory Visit: Payer: Medicare HMO | Admitting: Pulmonary Disease

## 2021-01-26 ENCOUNTER — Encounter: Payer: Self-pay | Admitting: Pulmonary Disease

## 2021-01-26 ENCOUNTER — Other Ambulatory Visit: Payer: Self-pay

## 2021-01-26 VITALS — BP 152/88 | HR 77 | Temp 97.4°F | Ht 69.0 in | Wt 210.8 lb

## 2021-01-26 DIAGNOSIS — R911 Solitary pulmonary nodule: Secondary | ICD-10-CM | POA: Diagnosis not present

## 2021-01-26 DIAGNOSIS — J9611 Chronic respiratory failure with hypoxia: Secondary | ICD-10-CM | POA: Diagnosis not present

## 2021-01-26 DIAGNOSIS — J449 Chronic obstructive pulmonary disease, unspecified: Secondary | ICD-10-CM | POA: Diagnosis not present

## 2021-01-26 DIAGNOSIS — J9612 Chronic respiratory failure with hypercapnia: Secondary | ICD-10-CM | POA: Diagnosis not present

## 2021-01-26 DIAGNOSIS — G4733 Obstructive sleep apnea (adult) (pediatric): Secondary | ICD-10-CM

## 2021-01-26 MED ORDER — IPRATROPIUM-ALBUTEROL 0.5-2.5 (3) MG/3ML IN SOLN: 3.0000 mL | Freq: Four times a day (QID) | RESPIRATORY_TRACT | 0 refills | Status: DC | PRN

## 2021-01-26 NOTE — Progress Notes (Signed)
[facility]  Chief Complaint  Patient presents with  . Follow-up    No complaints currently    Constitutional:  BP (!) 152/88 (BP Location: Left Arm, Cuff Size: Normal)   Pulse 77   Temp (!) 97.4 F (36.3 C) (Other (Comment)) Comment (Src): wrist  Ht 5\' 9"  (1.753 m)   Wt 210 lb 12.8 oz (95.6 kg)   SpO2 100% Comment: 2L Pulse O2  BMI 31.13 kg/m   Past Medical History:  Asthma, DM, CKD, Duodenal adenoma, GERD, Gout, SBO, Colon polyp, HLD, HTN, PE, Diastolic CHF, ETOH, CKD 3b  Past Surgical History:  He  has a past surgical history that includes Abdominal surgery (2103); Abdominal surgery (1981); Cataract extraction w/PHACO (Right, 01/06/2020); Cataract extraction w/PHACO (Left, 01/20/2020); back; Hernia repair; Colonoscopy with propofol (N/A, 08/28/2020); Esophagogastroduodenoscopy (egd) with propofol (N/A, 08/28/2020); biopsy (08/28/2020); polypectomy (08/28/2020); Givens capsule study (N/A, 09/10/2020); Esophagogastroduodenoscopy (egd) with propofol (N/A, 12/14/2020); Hemostasis clip placement (12/14/2020); Sclerotherapy (12/14/2020); polypectomy (12/14/2020); Submucosal tattoo injection (12/14/2020); and Hemostasis control (12/14/2020).  Brief Summary:  Andrew Adams is a 75 y.o. male former smoker with COPD, chronic respiratory failure, and sleep disordered breathing.      Subjective:   He has been using CPAP nightly.  No issues with mask fit.  He uses duoneb bid.  He doesn't feel this makes a difference, but thought he had to use it on regular basis.  Not having cough, wheeze, sputum, chest congestion.  He wants to see if he can come off supplemental oxygen.  He had CT abd/pelvis in December 2021.  Lower lung imaging showed 1.4 x 0.9 cm nodule abutting pleura in LLL and 4 mm nodule in superior segment of RLL.  He maintained SpO2 > 90% on room air with walking today.  Physical Exam:   Appearance - well kempt   ENMT - no sinus  tenderness, no oral exudate, no LAN, Mallampati  3 airway, no stridor  Respiratory - equal breath sounds bilaterally, no wheezing or rales  CV - s1s2 regular rate and rhythm, no murmurs  Ext - no clubbing, no edema  Skin - no rashes  Psych - normal mood and affect   Pulmonary testing:   PFT 07/07/20 >> 1.81 (68%), FEV1% 77, TLC 4.62 (67%), DLCO 39%, +BD  ABG with FiO2 30% 02/14/20 >> pH 7.34, PCO2 66.5, PO2 70.3  Chest Imaging:   CT angio chest 07/03/12 >> acute PE RUL  Sleep Tests:   PSG 05/31/20 >> AHI 29.4, SpO2 low 73%, used 2 liters O2  Auto CPAP 12/26/20 to 01/24/21 >> used on 30 of 30 nights with average 9 hrs 15 min.  Average AHI 0.9 with median CPAP 8 and 95 th percentile CPAP 10 cm H2O  Cardiac Tests:   Echo 10/30/19 >> EF 55 to 60%, mild LVH, grade 1 DD, mod LA dilation, mild MR  Social History:  He  reports that he quit smoking about 25 years ago. His smoking use included cigarettes. He has a 38.00 pack-year smoking history. He has never used smokeless tobacco. He reports current alcohol use. He reports that he does not use drugs.  Family History:  His family history includes Diabetes in his mother, sister, and sister; Hypertension in his father, mother, and sister; Ulcers in his father.     Assessment/Plan:   COPD mixed type. - minimal symptoms - advised that he can use duoneb prn only, and doesn't need to use on regular basis  Obstructive sleep apnea. - he is compliant with CPAP and reports benefit from therapy - he uses [employer] for his DME - continue auto CPAP 5 to 20 cm H2O  Chronic respiratory failure with hypoxia and hypercapnia. - uses [employer] for his DME - he no longer needs to supplemental oxygen during the day - will arrange for overnight oximetry with CPAP and 2 liters oxygen to determine if he can d/c home oxygen set up  Lung nodule with history of tobacco abuse. - will arrange for CT chest w/o contrast to further assess  Time Spent Involved  in Patient Care on Day of Examination:  33 minutes  Follow up:  Patient Instructions  Will arrange for CT chest  Can use duoneb (ipratropium-albuterol) in nebulizer every 6 hours as needed for coughing, shortness of breath, chest congestion or wheezing  You don't need to use supplemental oxygen during the day  Will arrange for overnight oxygen test with you using CPAP and 2 liters oxygen  Follow up in 4 weeks   Medication List:   Allergies as of 01/26/2021   No Known Allergies     Medication List       Accurate as of January 26, 2021 10:40 AM. If you have any questions, ask your nurse or doctor.        allopurinol 100 MG tablet Commonly known as: ZYLOPRIM Take 1 tablet (100 mg total) by mouth daily.   amLODipine 10 MG tablet Commonly known as: NORVASC Take 10 mg by mouth daily.   atorvastatin 40 MG tablet Commonly known as: LIPITOR Take 1 tablet (40 mg total) by mouth daily.   FeroSul 325 (65 FE) MG tablet Generic drug: ferrous sulfate TAKE ONE TABLET BY MOUTH EVERY MORNING WITH BREAKFAST   furosemide 40 MG tablet Commonly known as: LASIX Take 60 mg by mouth daily.   ipratropium-albuterol 0.5-2.5 (3) MG/3ML Soln Commonly known as: DUONEB Take 3 mLs by nebulization every 6 (six) hours as needed. What changed:   when to take this  reasons to take this Changed by: Chesley Mires, MD   losartan 50 MG tablet Commonly known as: COZAAR TAKE ONE TABLET BY MOUTH EVERY MORNING and TAKE ONE TABLET BY MOUTH EVERY EVENING   metoprolol tartrate 50 MG tablet Commonly known as: LOPRESSOR Take 1 tablet (50 mg total) by mouth 2 (two) times daily.   multivitamin with minerals Tabs tablet Take 1 tablet by mouth daily.   OXYGEN Inhale 2 L into the lungs continuous.   pantoprazole 40 MG tablet Commonly known as: PROTONIX Take 1 tablet (40 mg total) by mouth 2 (two) times daily before a meal.   Retacrit 3000 UNIT/ML injection Generic drug: epoetin alfa-epbx Inject  3,000 Units into the vein every 14 (fourteen) days.   Trulicity 7.91 TA/5.6PV Sopn Generic drug: Dulaglutide Inject 0.75 mg into the skin every 7 (seven) days.       Signature:  Chesley Mires, MD [place] Pager - [phone] 01/26/2021, 10:40 AM

## 2021-01-26 NOTE — Patient Instructions (Addendum)
Will arrange for CT chest  Can use duoneb (ipratropium-albuterol) in nebulizer every 6 hours as needed for coughing, shortness of breath, chest congestion or wheezing  You don't need to use supplemental oxygen during the day  Will arrange for overnight oxygen test with you using CPAP and 2 liters oxygen  Follow up in 4 weeks

## 2021-01-27 DIAGNOSIS — I129 Hypertensive chronic kidney disease with stage 1 through stage 4 chronic kidney disease, or unspecified chronic kidney disease: Secondary | ICD-10-CM | POA: Diagnosis not present

## 2021-01-27 DIAGNOSIS — E1122 Type 2 diabetes mellitus with diabetic chronic kidney disease: Secondary | ICD-10-CM | POA: Diagnosis not present

## 2021-01-27 DIAGNOSIS — E7849 Other hyperlipidemia: Secondary | ICD-10-CM | POA: Diagnosis not present

## 2021-01-27 DIAGNOSIS — N184 Chronic kidney disease, stage 4 (severe): Secondary | ICD-10-CM | POA: Diagnosis not present

## 2021-02-01 ENCOUNTER — Encounter: Payer: Self-pay | Admitting: Primary Care

## 2021-02-01 DIAGNOSIS — G4730 Sleep apnea, unspecified: Secondary | ICD-10-CM | POA: Diagnosis not present

## 2021-02-01 DIAGNOSIS — R0683 Snoring: Secondary | ICD-10-CM | POA: Diagnosis not present

## 2021-02-02 ENCOUNTER — Other Ambulatory Visit: Payer: Self-pay

## 2021-02-02 ENCOUNTER — Encounter (HOSPITAL_COMMUNITY)
Admission: RE | Admit: 2021-02-02 | Discharge: 2021-02-02 | Disposition: A | Discharge status: Home / Self Care | Payer: Medicare HMO | Source: Ambulatory Visit | Attending: Nephrology | Admitting: Nephrology

## 2021-02-02 ENCOUNTER — Encounter (HOSPITAL_COMMUNITY): Payer: Self-pay

## 2021-02-02 DIAGNOSIS — N184 Chronic kidney disease, stage 4 (severe): Secondary | ICD-10-CM | POA: Insufficient documentation

## 2021-02-02 MED ORDER — EPOETIN ALFA-EPBX 3000 UNIT/ML IJ SOLN
3000.0000 [IU] | Freq: Once | INTRAMUSCULAR | Status: AC
  Administered 2021-02-02: 3000 [IU] via SUBCUTANEOUS
  Filled 2021-02-02: qty 1

## 2021-02-04 DIAGNOSIS — J449 Chronic obstructive pulmonary disease, unspecified: Secondary | ICD-10-CM | POA: Diagnosis not present

## 2021-02-11 ENCOUNTER — Telehealth: Payer: Self-pay | Admitting: Primary Care

## 2021-02-11 DIAGNOSIS — G4733 Obstructive sleep apnea (adult) (pediatric): Secondary | ICD-10-CM | POA: Diagnosis not present

## 2021-02-11 LAB — POCT HEMOGLOBIN-HEMACUE: Hemoglobin: 9.7 g/dL — ABNORMAL LOW (ref 13.0–17.0)

## 2021-02-11 NOTE — Telephone Encounter (Signed)
02/01/21 ONO on 2L O2/CPAP showed patient spent 2 min with SpO2 <88%. SpO2 low 81% with average 95%.   No changes recommended at this time. Forwarding to Dr. Halford Chessman as he ordered overnight oximetry and saw patient most recently.

## 2021-02-11 NOTE — Telephone Encounter (Signed)
Called and spoke with pt letting him know the results of the ONO while using the 2L O2 and CPAP and he verbalized understanding. Nothing further needed.

## 2021-02-16 ENCOUNTER — Encounter (HOSPITAL_COMMUNITY)
Admission: RE | Admit: 2021-02-16 | Discharge: 2021-02-16 | Disposition: A | Discharge status: Home / Self Care | Payer: Medicare HMO | Source: Ambulatory Visit | Attending: Nephrology | Admitting: Nephrology

## 2021-02-16 ENCOUNTER — Other Ambulatory Visit: Payer: Self-pay

## 2021-02-16 DIAGNOSIS — N184 Chronic kidney disease, stage 4 (severe): Secondary | ICD-10-CM | POA: Diagnosis not present

## 2021-02-16 LAB — POCT HEMOGLOBIN-HEMACUE: Hemoglobin: 11.7 g/dL — ABNORMAL LOW (ref 13.0–17.0)

## 2021-02-16 LAB — PROTEIN / CREATININE RATIO, URINE
Creatinine, Urine: 95.39 mg/dL
Protein Creatinine Ratio: 0.12 mg/mg{Cre} (ref 0.00–0.15)
Total Protein, Urine: 11 mg/dL

## 2021-02-16 MED ORDER — EPOETIN ALFA-EPBX 3000 UNIT/ML IJ SOLN: 3000.0000 [IU] | Freq: Once | INTRAMUSCULAR | Status: DC

## 2021-02-18 DIAGNOSIS — J449 Chronic obstructive pulmonary disease, unspecified: Secondary | ICD-10-CM | POA: Diagnosis not present

## 2021-02-18 DIAGNOSIS — I509 Heart failure, unspecified: Secondary | ICD-10-CM | POA: Diagnosis not present

## 2021-02-18 DIAGNOSIS — G4733 Obstructive sleep apnea (adult) (pediatric): Secondary | ICD-10-CM | POA: Diagnosis not present

## 2021-02-18 DIAGNOSIS — G4736 Sleep related hypoventilation in conditions classified elsewhere: Secondary | ICD-10-CM | POA: Diagnosis not present

## 2021-02-19 ENCOUNTER — Ambulatory Visit (HOSPITAL_COMMUNITY)
Admission: RE | Admit: 2021-02-19 | Discharge: 2021-02-19 | Disposition: A | Discharge status: Home / Self Care | Payer: Medicare HMO | Source: Ambulatory Visit | Attending: Pulmonary Disease | Admitting: Pulmonary Disease

## 2021-02-19 ENCOUNTER — Other Ambulatory Visit: Payer: Self-pay

## 2021-02-19 DIAGNOSIS — I7789 Other specified disorders of arteries and arterioles: Secondary | ICD-10-CM | POA: Diagnosis not present

## 2021-02-19 DIAGNOSIS — I700 Atherosclerosis of aorta: Secondary | ICD-10-CM | POA: Diagnosis not present

## 2021-02-19 DIAGNOSIS — R911 Solitary pulmonary nodule: Secondary | ICD-10-CM | POA: Insufficient documentation

## 2021-02-19 DIAGNOSIS — I2510 Atherosclerotic heart disease of native coronary artery without angina pectoris: Secondary | ICD-10-CM | POA: Diagnosis not present

## 2021-02-19 DIAGNOSIS — I517 Cardiomegaly: Secondary | ICD-10-CM | POA: Diagnosis not present

## 2021-02-23 ENCOUNTER — Encounter: Payer: Self-pay | Admitting: Pulmonary Disease

## 2021-02-23 ENCOUNTER — Ambulatory Visit: Payer: Medicare HMO | Admitting: Pulmonary Disease

## 2021-02-23 ENCOUNTER — Other Ambulatory Visit: Payer: Self-pay

## 2021-02-23 VITALS — BP 158/90 | HR 70 | Temp 98.0°F | Ht 69.0 in | Wt 211.0 lb

## 2021-02-23 DIAGNOSIS — R911 Solitary pulmonary nodule: Secondary | ICD-10-CM | POA: Diagnosis not present

## 2021-02-23 DIAGNOSIS — G4733 Obstructive sleep apnea (adult) (pediatric): Secondary | ICD-10-CM | POA: Diagnosis not present

## 2021-02-23 DIAGNOSIS — J449 Chronic obstructive pulmonary disease, unspecified: Secondary | ICD-10-CM

## 2021-02-23 DIAGNOSIS — J9611 Chronic respiratory failure with hypoxia: Secondary | ICD-10-CM | POA: Diagnosis not present

## 2021-02-23 NOTE — Progress Notes (Signed)
[facility]  Chief Complaint  Patient presents with  . Follow-up    No complaints currently    Constitutional:  BP (!) 158/90 (BP Location: Left Arm, Cuff Size: Normal)   Pulse 70   Temp 98 F (36.7 C) (Other (Comment)) Comment (Src): wrist  Ht 5\' 9"  (1.753 m)   Wt 211 lb (95.7 kg)   SpO2 99% Comment: Room air  BMI 31.16 kg/m   Past Medical History:  Asthma, DM, CKD, Duodenal adenoma, GERD, Gout, SBO, Colon polyp, HLD, HTN, PE, Diastolic CHF, ETOH, CKD 3b  Past Surgical History:  He  has a past surgical history that includes Abdominal surgery (2103); Abdominal surgery (1981); Cataract extraction w/PHACO (Right, 01/06/2020); Cataract extraction w/PHACO (Left, 01/20/2020); back; Hernia repair; Colonoscopy with propofol (N/A, 08/28/2020); Esophagogastroduodenoscopy (egd) with propofol (N/A, 08/28/2020); biopsy (08/28/2020); polypectomy (08/28/2020); Givens capsule study (N/A, 09/10/2020); Esophagogastroduodenoscopy (egd) with propofol (N/A, 12/14/2020); Hemostasis clip placement (12/14/2020); Sclerotherapy (12/14/2020); polypectomy (12/14/2020); Submucosal tattoo injection (12/14/2020); and Hemostasis control (12/14/2020).  Brief Summary:  Andrew Adams is a 75 y.o. male former smoker with COPD, chronic respiratory failure, and sleep disordered breathing.      Subjective:   Had CT chest.  Lung nodules stable.    Not having cough, wheeze, or sputum.  Breathing okay at slow pace.  Uses CPAP nightly.  No issues with mask fit.  He has been using CPAP nightly.  No issues with mask fit.   Physical Exam:   Appearance - well kempt   ENMT - no sinus tenderness, no oral exudate, no LAN, Mallampati 3 airway, no stridor, wears dentures  Respiratory - equal breath sounds bilaterally, no wheezing or rales  CV - s1s2 regular rate and rhythm, no murmurs  Ext - no clubbing, no edema  Skin - no rashes  Psych - normal mood and affect    Pulmonary testing:   PFT 07/07/20 >> 1.81 (68%), FEV1% 77, TLC 4.62 (67%), DLCO 39%, +BD  ABG with FiO2 30% 02/14/20 >> pH 7.34, PCO2 66.5, PO2 70.3  Chest Imaging:   CT angio chest 07/03/12 >> acute PE RUL  CT chest 02/19/21 >> 1.8 x 1 cm  LLL nodule stable, 7 mm RLL nodule stable, 3 mm LLL nodule stable  Sleep Tests:   PSG 05/31/20 >> AHI 29.4, SpO2 low 73%, used 2 liters O2  Auto CPAP 3/35/33 to 02/20/21 >> used on 30 of 30 nights with average 8 hrs 31 min.  Average AHI 0.3 with median CPAP 6 and 95 th percentile CPAP 10 cm H2O  Cardiac Tests:   Echo 10/30/19 >> EF 55 to 60%, mild LVH, grade 1 DD, mod LA dilation, mild MR  Social History:  He  reports that he quit smoking about 26 years ago. His smoking use included cigarettes. He has a 38.00 pack-year smoking history. He has never used smokeless tobacco. He reports current alcohol use. He reports that he does not use drugs.  Family History:  His family history includes Diabetes in his mother, sister, and sister; Hypertension in his father, mother, and sister; Ulcers in his father.     Assessment/Plan:   COPD mixed type. - minimal symptoms - prn duoneb  Obstructive sleep apnea. - he is compliant with CPAP and reports benefit from therapy - he uses [employer] for his DME - continue auto CPAP 5 to 20 cm H2O  Chronic respiratory failure with hypoxia and hypercapnia. - uses [employer] for his DME -  he no longer needs to supplemental oxygen during the day - ordered ONO with CPAP in March, but not done yet; will resend order  Lung nodule with history of tobacco abuse. - will need follow up CT chest w/o contrast in April 2023  Time Spent Involved in Patient Care on Day of Examination:  32 minutes  Follow up:  Patient Instructions  Will schedule CT chest for April 2023 and schedule follow up after this   Medication List:   Allergies as of 02/23/2021   No Known Allergies     Medication List       Accurate as of February 23, 2021 10:24 AM. If you have any questions, ask your nurse or doctor.        allopurinol 100 MG tablet Commonly known as: ZYLOPRIM Take 1 tablet (100 mg total) by mouth daily.   amLODipine 10 MG tablet Commonly known as: NORVASC Take 10 mg by mouth daily.   atorvastatin 40 MG tablet Commonly known as: LIPITOR Take 1 tablet (40 mg total) by mouth daily.   FeroSul 325 (65 FE) MG tablet Generic drug: ferrous sulfate TAKE ONE TABLET BY MOUTH EVERY MORNING WITH BREAKFAST   furosemide 40 MG tablet Commonly known as: LASIX Take 60 mg by mouth daily.   ipratropium-albuterol 0.5-2.5 (3) MG/3ML Soln Commonly known as: DUONEB Take 3 mLs by nebulization every 6 (six) hours as needed.   losartan 50 MG tablet Commonly known as: COZAAR TAKE ONE TABLET BY MOUTH EVERY MORNING and TAKE ONE TABLET BY MOUTH EVERY EVENING   metoprolol tartrate 50 MG tablet Commonly known as: LOPRESSOR Take 1 tablet (50 mg total) by mouth 2 (two) times daily.   multivitamin with minerals Tabs tablet Take 1 tablet by mouth daily.   OXYGEN Inhale 2 L into the lungs continuous.   pantoprazole 40 MG tablet Commonly known as: PROTONIX Take 1 tablet (40 mg total) by mouth 2 (two) times daily before a meal.   Retacrit 3000 UNIT/ML injection Generic drug: epoetin alfa-epbx Inject 3,000 Units into the vein every 14 (fourteen) days.   Trulicity 5.46 EV/0.3JK Sopn Generic drug: Dulaglutide Inject 0.75 mg into the skin every 7 (seven) days.       Signature:  Chesley Mires, MD [place] Pager - [phone] 02/23/2021, 10:24 AM

## 2021-02-23 NOTE — Patient Instructions (Signed)
Will schedule CT chest for April 2023 and schedule follow up after this

## 2021-02-24 DIAGNOSIS — G4730 Sleep apnea, unspecified: Secondary | ICD-10-CM | POA: Diagnosis not present

## 2021-02-24 DIAGNOSIS — R0683 Snoring: Secondary | ICD-10-CM | POA: Diagnosis not present

## 2021-02-27 DIAGNOSIS — E7849 Other hyperlipidemia: Secondary | ICD-10-CM | POA: Diagnosis not present

## 2021-02-27 DIAGNOSIS — N184 Chronic kidney disease, stage 4 (severe): Secondary | ICD-10-CM | POA: Diagnosis not present

## 2021-02-27 DIAGNOSIS — I129 Hypertensive chronic kidney disease with stage 1 through stage 4 chronic kidney disease, or unspecified chronic kidney disease: Secondary | ICD-10-CM | POA: Diagnosis not present

## 2021-02-27 DIAGNOSIS — E1122 Type 2 diabetes mellitus with diabetic chronic kidney disease: Secondary | ICD-10-CM | POA: Diagnosis not present

## 2021-03-02 ENCOUNTER — Other Ambulatory Visit: Payer: Self-pay

## 2021-03-02 ENCOUNTER — Encounter (HOSPITAL_COMMUNITY)
Admission: RE | Admit: 2021-03-02 | Discharge: 2021-03-02 | Disposition: A | Discharge status: Home / Self Care | Payer: Medicare HMO | Source: Ambulatory Visit | Attending: Nephrology | Admitting: Nephrology

## 2021-03-02 DIAGNOSIS — N184 Chronic kidney disease, stage 4 (severe): Secondary | ICD-10-CM | POA: Diagnosis present

## 2021-03-02 LAB — CBC WITH DIFFERENTIAL/PLATELET
Abs Immature Granulocytes: 0.01 10*3/uL (ref 0.00–0.07)
Basophils Absolute: 0 10*3/uL (ref 0.0–0.1)
Basophils Relative: 0 %
Eosinophils Absolute: 0.2 10*3/uL (ref 0.0–0.5)
Eosinophils Relative: 4 %
HCT: 29.8 % — ABNORMAL LOW (ref 39.0–52.0)
Hemoglobin: 9.8 g/dL — ABNORMAL LOW (ref 13.0–17.0)
Immature Granulocytes: 0 %
Lymphocytes Relative: 31 %
Lymphs Abs: 1.5 10*3/uL (ref 0.7–4.0)
MCH: 28.3 pg (ref 26.0–34.0)
MCHC: 32.9 g/dL (ref 30.0–36.0)
MCV: 86.1 fL (ref 80.0–100.0)
Monocytes Absolute: 0.5 10*3/uL (ref 0.1–1.0)
Monocytes Relative: 10 %
Neutro Abs: 2.7 10*3/uL (ref 1.7–7.7)
Neutrophils Relative %: 55 %
Platelets: 161 10*3/uL (ref 150–400)
RBC: 3.46 MIL/uL — ABNORMAL LOW (ref 4.22–5.81)
RDW: 14.1 % (ref 11.5–15.5)
WBC: 4.8 10*3/uL (ref 4.0–10.5)
nRBC: 0 % (ref 0.0–0.2)

## 2021-03-02 LAB — RENAL FUNCTION PANEL
Albumin: 3.7 g/dL (ref 3.5–5.0)
Anion gap: 8 (ref 5–15)
BUN: 23 mg/dL (ref 8–23)
CO2: 28 mmol/L (ref 22–32)
Calcium: 9 mg/dL (ref 8.9–10.3)
Chloride: 101 mmol/L (ref 98–111)
Creatinine, Ser: 2.14 mg/dL — ABNORMAL HIGH (ref 0.61–1.24)
GFR, Estimated: 31 mL/min — ABNORMAL LOW (ref >60)
Glucose, Bld: 88 mg/dL (ref 70–99)
Phosphorus: 3.7 mg/dL (ref 2.5–4.6)
Potassium: 4.1 mmol/L (ref 3.5–5.1)
Sodium: 137 mmol/L (ref 135–145)

## 2021-03-02 LAB — IRON AND TIBC
Iron: 57 ug/dL (ref 45–182)
Saturation Ratios: 19 % (ref 17.9–39.5)
TIBC: 306 ug/dL (ref 250–450)
UIBC: 249 ug/dL

## 2021-03-02 LAB — PROTEIN / CREATININE RATIO, URINE
Creatinine, Urine: 42.89 mg/dL
Protein Creatinine Ratio: 0.28 mg/mg{Cre} — ABNORMAL HIGH (ref 0.00–0.15)
Total Protein, Urine: 12 mg/dL

## 2021-03-02 LAB — POCT HEMOGLOBIN-HEMACUE: Hemoglobin: 9.7 g/dL — ABNORMAL LOW (ref 13.0–17.0)

## 2021-03-02 MED ORDER — EPOETIN ALFA-EPBX 3000 UNIT/ML IJ SOLN
3000.0000 [IU] | Freq: Once | INTRAMUSCULAR | Status: AC
  Administered 2021-03-02: 3000 [IU] via SUBCUTANEOUS
  Filled 2021-03-02: qty 1

## 2021-03-03 ENCOUNTER — Telehealth: Payer: Self-pay | Admitting: Pulmonary Disease

## 2021-03-03 DIAGNOSIS — R809 Proteinuria, unspecified: Secondary | ICD-10-CM | POA: Diagnosis not present

## 2021-03-03 DIAGNOSIS — E1122 Type 2 diabetes mellitus with diabetic chronic kidney disease: Secondary | ICD-10-CM | POA: Diagnosis not present

## 2021-03-03 DIAGNOSIS — D631 Anemia in chronic kidney disease: Secondary | ICD-10-CM | POA: Diagnosis not present

## 2021-03-03 DIAGNOSIS — N189 Chronic kidney disease, unspecified: Secondary | ICD-10-CM | POA: Diagnosis not present

## 2021-03-03 DIAGNOSIS — I5032 Chronic diastolic (congestive) heart failure: Secondary | ICD-10-CM | POA: Diagnosis not present

## 2021-03-03 DIAGNOSIS — E1129 Type 2 diabetes mellitus with other diabetic kidney complication: Secondary | ICD-10-CM | POA: Diagnosis not present

## 2021-03-03 DIAGNOSIS — I129 Hypertensive chronic kidney disease with stage 1 through stage 4 chronic kidney disease, or unspecified chronic kidney disease: Secondary | ICD-10-CM | POA: Diagnosis not present

## 2021-03-03 LAB — PTH, INTACT AND CALCIUM
Calcium, Total (PTH): 9.2 mg/dL (ref 8.6–10.2)
PTH: 49 pg/mL (ref 15–65)

## 2021-03-03 NOTE — Telephone Encounter (Signed)
ONO with CPAP 02/24/21 >> used on 9 hrs 10 min.  Baseline SpO2 91%, SpO2 low 79%.  Spent 1 hr 4 min with SpO2 < 88%.   Please let him know that his oxygen is still low at night.  If he still has home oxygen set up, then he needs to resume using supplemental oxygen with CPAP at night.

## 2021-03-04 NOTE — Telephone Encounter (Signed)
Called and went over ONO results per Dr Halford Chessman with patient. All questions answered and patient expressed full understanding of results and Dr Juanetta Gosling recommendations. Patient stated he does still have home O2 set up and expressed understanding to resume using supplemental oxygen with CPAP at night. Nothing further needed at this time.

## 2021-03-06 DIAGNOSIS — J449 Chronic obstructive pulmonary disease, unspecified: Secondary | ICD-10-CM | POA: Diagnosis not present

## 2021-03-13 DIAGNOSIS — G4733 Obstructive sleep apnea (adult) (pediatric): Secondary | ICD-10-CM | POA: Diagnosis not present

## 2021-03-16 ENCOUNTER — Encounter (HOSPITAL_COMMUNITY)
Admission: RE | Admit: 2021-03-16 | Discharge: 2021-03-16 | Disposition: A | Discharge status: Home / Self Care | Payer: Medicare HMO | Source: Ambulatory Visit | Attending: Nephrology | Admitting: Nephrology

## 2021-03-16 ENCOUNTER — Other Ambulatory Visit: Payer: Self-pay

## 2021-03-16 DIAGNOSIS — N184 Chronic kidney disease, stage 4 (severe): Secondary | ICD-10-CM | POA: Diagnosis not present

## 2021-03-16 LAB — POCT HEMOGLOBIN-HEMACUE: Hemoglobin: 10.8 g/dL — ABNORMAL LOW (ref 13.0–17.0)

## 2021-03-17 DIAGNOSIS — I10 Essential (primary) hypertension: Secondary | ICD-10-CM | POA: Diagnosis not present

## 2021-03-17 DIAGNOSIS — I5032 Chronic diastolic (congestive) heart failure: Secondary | ICD-10-CM | POA: Diagnosis not present

## 2021-03-17 DIAGNOSIS — N189 Chronic kidney disease, unspecified: Secondary | ICD-10-CM | POA: Diagnosis not present

## 2021-03-17 DIAGNOSIS — E119 Type 2 diabetes mellitus without complications: Secondary | ICD-10-CM | POA: Diagnosis not present

## 2021-03-17 DIAGNOSIS — R600 Localized edema: Secondary | ICD-10-CM | POA: Diagnosis not present

## 2021-03-17 DIAGNOSIS — Z6832 Body mass index (BMI) 32.0-32.9, adult: Secondary | ICD-10-CM | POA: Diagnosis not present

## 2021-03-20 DIAGNOSIS — J449 Chronic obstructive pulmonary disease, unspecified: Secondary | ICD-10-CM | POA: Diagnosis not present

## 2021-03-20 DIAGNOSIS — I509 Heart failure, unspecified: Secondary | ICD-10-CM | POA: Diagnosis not present

## 2021-03-20 DIAGNOSIS — G4736 Sleep related hypoventilation in conditions classified elsewhere: Secondary | ICD-10-CM | POA: Diagnosis not present

## 2021-03-20 DIAGNOSIS — G4733 Obstructive sleep apnea (adult) (pediatric): Secondary | ICD-10-CM | POA: Diagnosis not present

## 2021-03-20 IMAGING — CT CT ABD-PELV W/O CM
3 of 5 series · 15 of 46 positions shown, 17 images · non-contrast
Comparison: None.

CLINICAL DATA: Nodule

EXAM:
CT ABDOMEN AND PELVIS WITHOUT CONTRAST
TECHNIQUE: Multidetector CT imaging of the abdomen and pelvis was performed
following the standard protocol without IV contrast. Oral contrast
was administered.

[Series 2: axial st · axial · 0.82mm/px · z∈[-693,-588]mm · 3 of 96 slices shown (1 of 2)]
[im 11/96  soft-tissue]
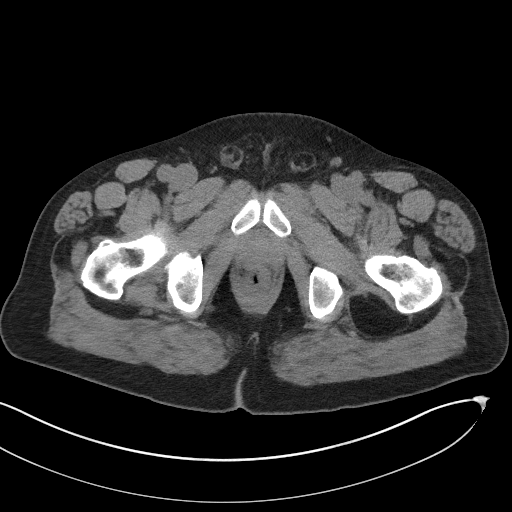
[im 22/96  soft-tissue]
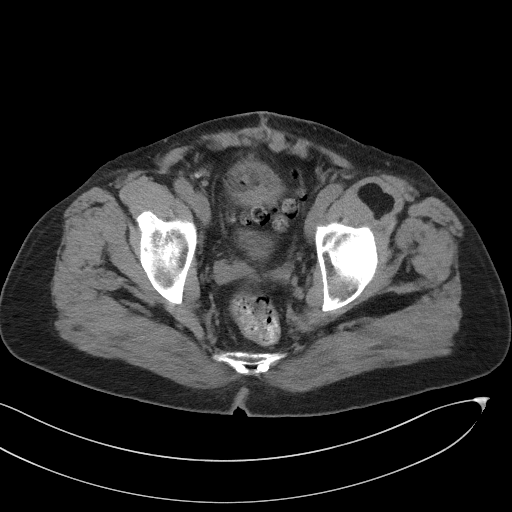
[im 32/96  soft-tissue]
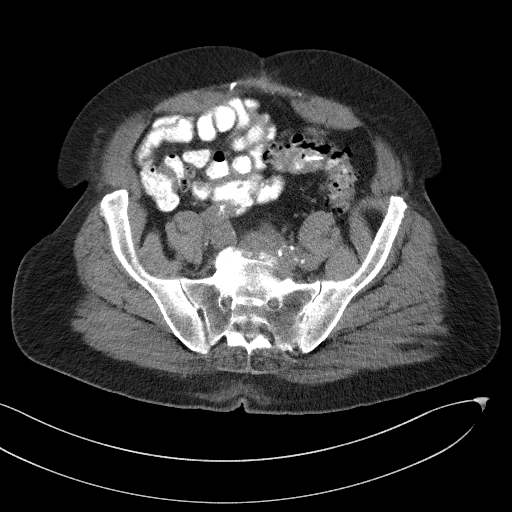

[Series 5: coronal st · coronal · 0.93mm/px · 3 of 110 slices shown]
[im 37/110  soft-tissue]
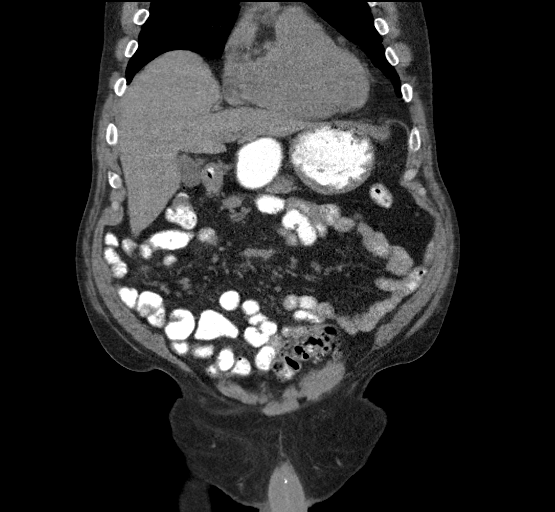
[im 49/110  soft-tissue]
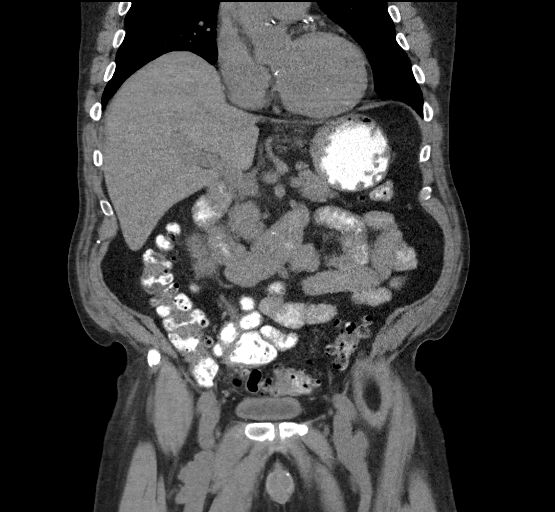
[im 61/110  soft-tissue]
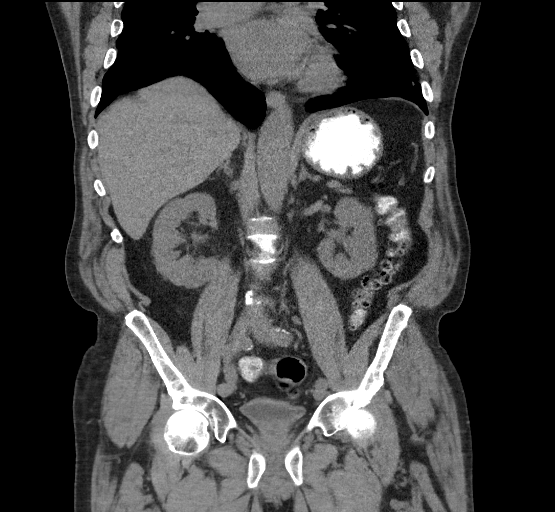

[Series 7: axial st · axial · 0.82mm/px · z∈[-698,-318]mm · 9 of 96 slices shown, 11 images (2 of 2)]
[im 10/96  soft-tissue]
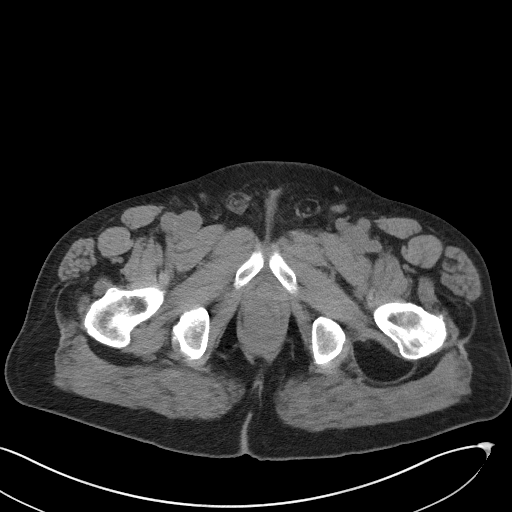
[im 10/96  bone]
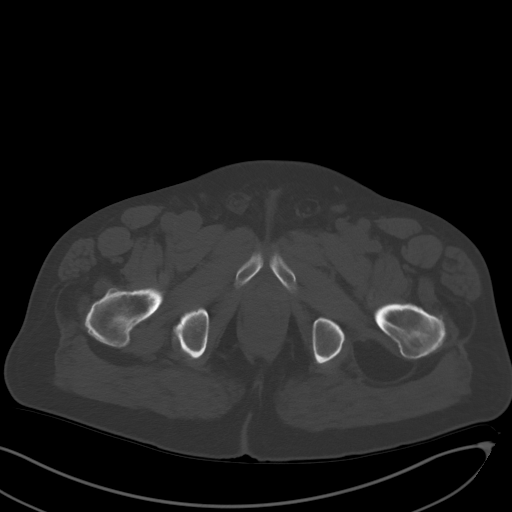
[im 20/96  soft-tissue]
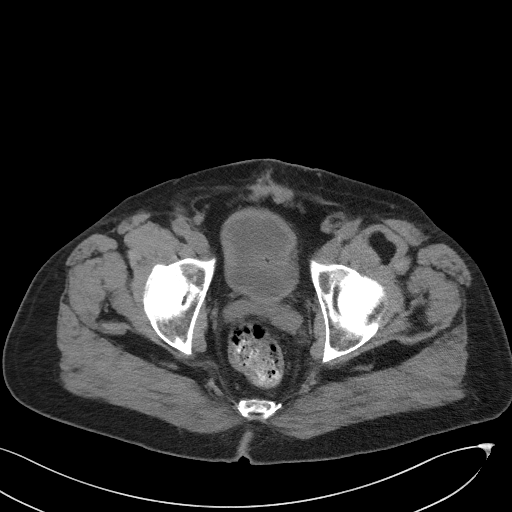
[im 29/96  soft-tissue]
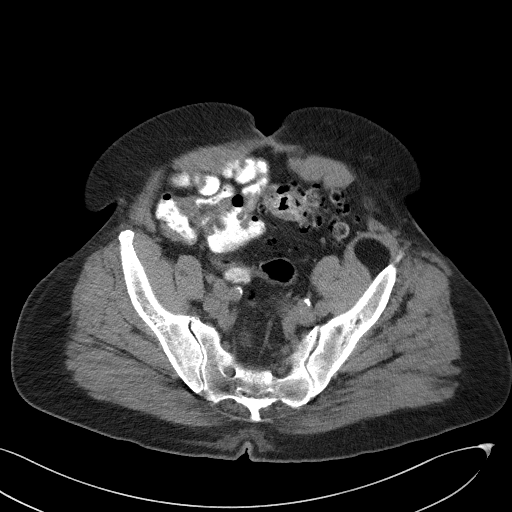
[im 39/96  soft-tissue]
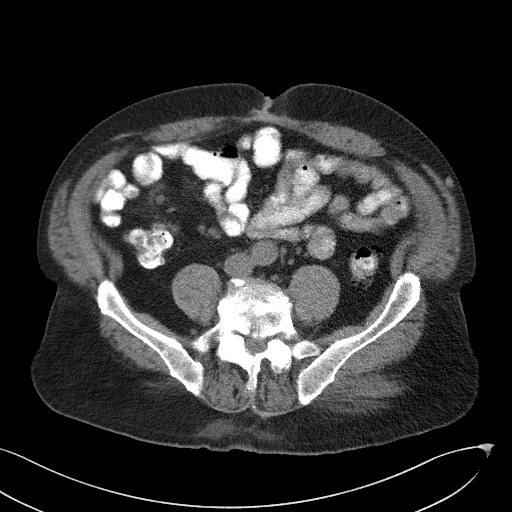
[im 48/96  soft-tissue]
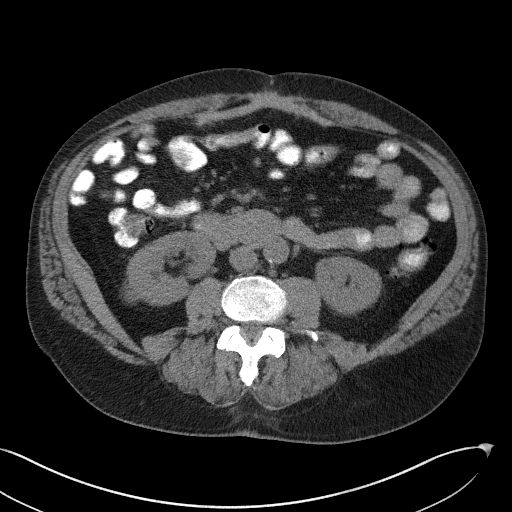
[im 58/96  soft-tissue]
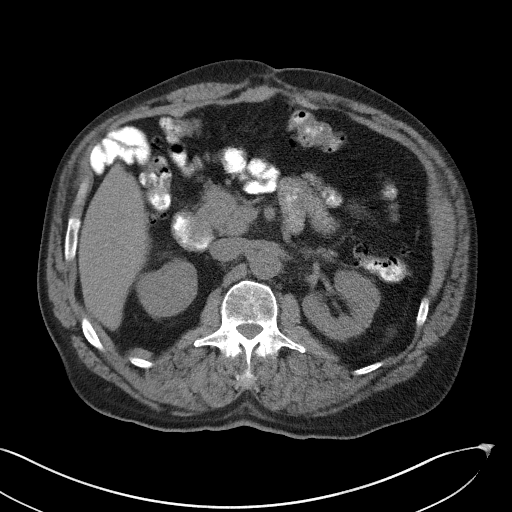
[im 67/96  soft-tissue]
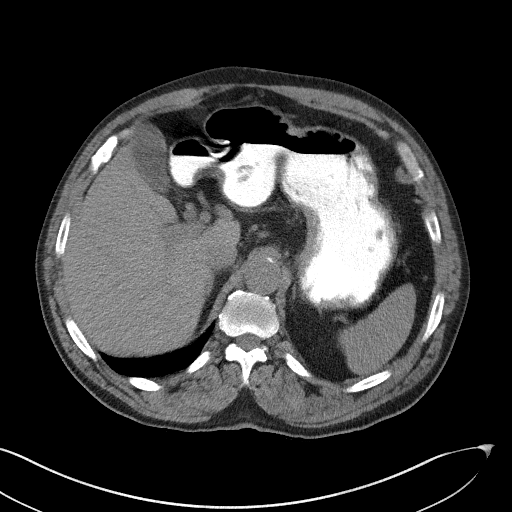
[im 77/96  soft-tissue]
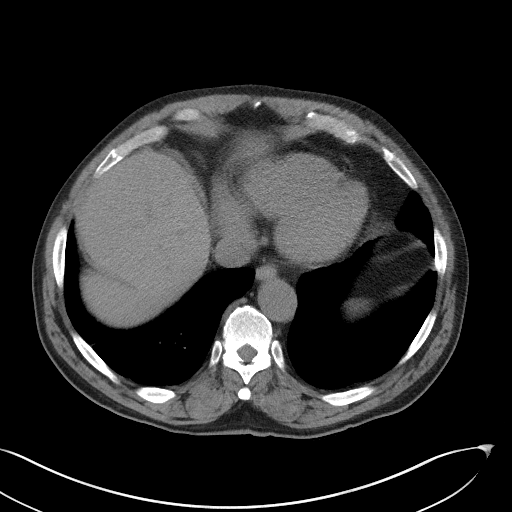
[im 86/96  soft-tissue]
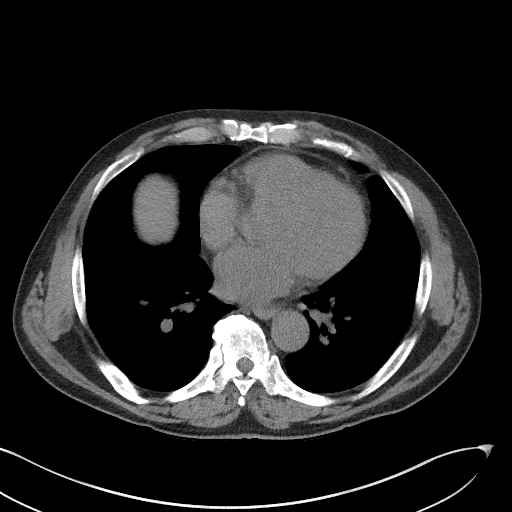
[im 86/96  bone]
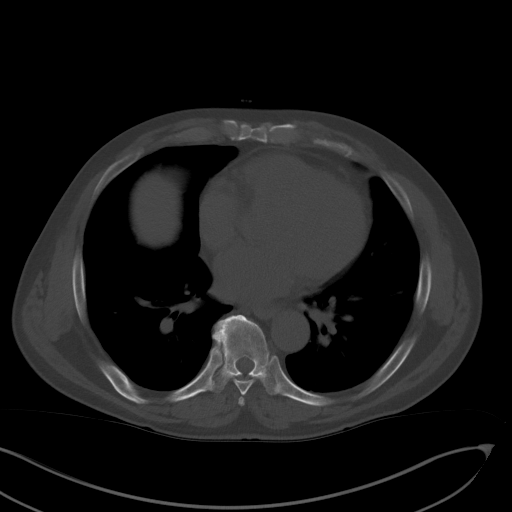

[15 of 46 positions shown; findings below may reference images not displayed]

FINDINGS: Lower chest: There is bibasilar atelectasis. There is a nodular
opacity abutting the pleura in the lateral segment of the left lower
lobe measuring 1.4 x 0.9 cm, best seen on axial slice 35 series 4.
There is a nodular opacity in the superior segment of the right
lower lobe measuring 4 mm, seen on axial slice 16 series 4. There is
a 2 mm nodular opacity in the lateral segment of the right lower
lobe seen on axial slice 12 series 4. There are foci of coronary
artery calcification.

Hepatobiliary: There is a punctate calcification in the anterior
segment of the right lobe of the liver medially, a probable tiny
granuloma. No other focal liver lesions are appreciable on this
noncontrast enhanced study. Gallbladder wall is not appreciably
thickened. There is no biliary duct dilatation.

Pancreas: There is no evident pancreatic mass or inflammatory focus.

Spleen: No splenic lesions are evident.

Adrenals/Urinary Tract: Adrenals bilaterally appear normal. There is
a cyst in the mid right kidney measuring 1.5 x 1.5 cm. There is a
cyst arising from the lateral periphery of the lower right kidney
measuring 1.0 x 1.9 cm. There is no appreciable hydronephrosis on
either side. There is no renal or ureteral calculus on either side.
The urinary bladder is midline. Urinary bladder wall is slightly
thickened.

Stomach/Bowel: There are multiple sigmoid diverticula. Wall
thickening in a portion of the sigmoid colon is likely due to
chronic muscular hypertrophy from diverticulosis. There is no
mesenteric thickening in this area to suggest diverticulitis.

There is a filling defect in the second portion of the duodenum
measuring 2.8 x 2.7 x 2.2 cm, best appreciated on axial slice 38
series 2 and sagittal slice 60 series 6. This lesion is less well
seen on coronal images. There is no associated ulceration in this
area by noncontrast enhanced CT. No other focal mass involving bowel
is evident. There is no bowel obstruction. The terminal ileum
appears normal. There is no evident free air or portal venous air.

Vascular/Lymphatic: There is no abdominal aortic aneurysm. There are
foci of aortic and iliac artery atherosclerosis. There is no
appreciable adenopathy by size criteria in the abdomen or pelvis.
There are a few scattered subcentimeter mesenteric lymph nodes.

Reproductive: Prostate and seminal vesicles are normal in size and
contour.

Other: No appreciable appendiceal region inflammation. No abscess or
ascites evident in the abdomen or pelvis.

Musculoskeletal: There is degenerative change in the lumbar spine.
No blastic or lytic bone lesions. There is no intramuscular or
abdominal wall lesion.
IMPRESSION: 1. Apparent mass in the second portion of the duodenum measuring
x 2.7 x 2.2 cm, likely corresponding to the reported tubulovillous
adenoma seen by endoscopy. No ulceration is seen in this mass. There
is no extension of mass beyond the confines of the bowel appreciable
on nonintravenous contrast study.

2. Extensive sigmoid diverticulosis with probable muscular
hypertrophy from chronic diverticulosis in the mid to lower sigmoid
region. No frank diverticulitis.

3. No bowel obstruction. No abscess in the abdomen or pelvis. No
periappendiceal region inflammatory change.

4. Nodular opacities in the lung bases with the largest nodular
opacity in the left lower lobe measuring 1.4 x 0.9 cm. This nodular
lesion may warrant correlation with nuclear medicine PET study to
assess for abnormal metabolic activity given its size and potential
for neoplastic involvement.

5. Aortic Atherosclerosis (MEOSD-VY2.2). There are foci of coronary
artery and iliac artery atherosclerotic calcification.

6. Slight thickening of urinary bladder wall. Advise correlation
with urinalysis to assess for potential degree of cystitis.

## 2021-03-21 ENCOUNTER — Other Ambulatory Visit: Payer: Self-pay | Admitting: Internal Medicine

## 2021-03-24 DIAGNOSIS — E119 Type 2 diabetes mellitus without complications: Secondary | ICD-10-CM | POA: Diagnosis not present

## 2021-03-24 DIAGNOSIS — H35371 Puckering of macula, right eye: Secondary | ICD-10-CM | POA: Diagnosis not present

## 2021-03-24 DIAGNOSIS — H353131 Nonexudative age-related macular degeneration, bilateral, early dry stage: Secondary | ICD-10-CM | POA: Diagnosis not present

## 2021-03-29 DIAGNOSIS — E1122 Type 2 diabetes mellitus with diabetic chronic kidney disease: Secondary | ICD-10-CM | POA: Diagnosis not present

## 2021-03-29 DIAGNOSIS — E7849 Other hyperlipidemia: Secondary | ICD-10-CM | POA: Diagnosis not present

## 2021-03-29 DIAGNOSIS — I129 Hypertensive chronic kidney disease with stage 1 through stage 4 chronic kidney disease, or unspecified chronic kidney disease: Secondary | ICD-10-CM | POA: Diagnosis not present

## 2021-03-29 DIAGNOSIS — N184 Chronic kidney disease, stage 4 (severe): Secondary | ICD-10-CM | POA: Diagnosis not present

## 2021-03-30 ENCOUNTER — Other Ambulatory Visit: Payer: Self-pay

## 2021-03-30 ENCOUNTER — Encounter (HOSPITAL_COMMUNITY): Payer: Self-pay

## 2021-03-30 ENCOUNTER — Encounter (HOSPITAL_COMMUNITY)
Admission: RE | Admit: 2021-03-30 | Discharge: 2021-03-30 | Disposition: A | Discharge status: Home / Self Care | Payer: Medicare HMO | Source: Ambulatory Visit | Attending: Nephrology | Admitting: Nephrology

## 2021-03-30 DIAGNOSIS — N184 Chronic kidney disease, stage 4 (severe): Secondary | ICD-10-CM | POA: Diagnosis not present

## 2021-03-30 LAB — POCT HEMOGLOBIN-HEMACUE: Hemoglobin: 9.5 g/dL — ABNORMAL LOW (ref 13.0–17.0)

## 2021-03-30 MED ORDER — EPOETIN ALFA-EPBX 3000 UNIT/ML IJ SOLN
3000.0000 [IU] | Freq: Once | INTRAMUSCULAR | Status: AC
  Administered 2021-03-30: 3000 [IU] via SUBCUTANEOUS
  Filled 2021-03-30: qty 1

## 2021-04-06 DIAGNOSIS — J449 Chronic obstructive pulmonary disease, unspecified: Secondary | ICD-10-CM | POA: Diagnosis not present

## 2021-04-13 ENCOUNTER — Other Ambulatory Visit: Payer: Self-pay

## 2021-04-13 ENCOUNTER — Encounter (HOSPITAL_COMMUNITY): Payer: Self-pay

## 2021-04-13 ENCOUNTER — Encounter (HOSPITAL_COMMUNITY)
Admission: RE | Admit: 2021-04-13 | Discharge: 2021-04-13 | Disposition: A | Discharge status: Home / Self Care | Payer: Medicare HMO | Source: Ambulatory Visit | Attending: Nephrology | Admitting: Nephrology

## 2021-04-13 DIAGNOSIS — D631 Anemia in chronic kidney disease: Secondary | ICD-10-CM | POA: Insufficient documentation

## 2021-04-13 DIAGNOSIS — N184 Chronic kidney disease, stage 4 (severe): Secondary | ICD-10-CM | POA: Diagnosis not present

## 2021-04-13 LAB — PROTEIN / CREATININE RATIO, URINE
Creatinine, Urine: 103.79 mg/dL
Protein Creatinine Ratio: 0.17 mg/mg{Cre} — ABNORMAL HIGH (ref 0.00–0.15)
Total Protein, Urine: 18 mg/dL

## 2021-04-13 LAB — RENAL FUNCTION PANEL
Albumin: 3.8 g/dL (ref 3.5–5.0)
Anion gap: 6 (ref 5–15)
BUN: 29 mg/dL — ABNORMAL HIGH (ref 8–23)
CO2: 26 mmol/L (ref 22–32)
Calcium: 8.6 mg/dL — ABNORMAL LOW (ref 8.9–10.3)
Chloride: 102 mmol/L (ref 98–111)
Creatinine, Ser: 2.25 mg/dL — ABNORMAL HIGH (ref 0.61–1.24)
GFR, Estimated: 29 mL/min — ABNORMAL LOW (ref >60)
Glucose, Bld: 131 mg/dL — ABNORMAL HIGH (ref 70–99)
Phosphorus: 4.4 mg/dL (ref 2.5–4.6)
Potassium: 4.5 mmol/L (ref 3.5–5.1)
Sodium: 134 mmol/L — ABNORMAL LOW (ref 135–145)

## 2021-04-13 LAB — CBC
HCT: 30.1 % — ABNORMAL LOW (ref 39.0–52.0)
Hemoglobin: 9.5 g/dL — ABNORMAL LOW (ref 13.0–17.0)
MCH: 27.3 pg (ref 26.0–34.0)
MCHC: 31.6 g/dL (ref 30.0–36.0)
MCV: 86.5 fL (ref 80.0–100.0)
Platelets: 170 10*3/uL (ref 150–400)
RBC: 3.48 MIL/uL — ABNORMAL LOW (ref 4.22–5.81)
RDW: 14.3 % (ref 11.5–15.5)
WBC: 4.4 10*3/uL (ref 4.0–10.5)
nRBC: 0 % (ref 0.0–0.2)

## 2021-04-13 LAB — IRON AND TIBC
Iron: 79 ug/dL (ref 45–182)
Saturation Ratios: 26 % (ref 17.9–39.5)
TIBC: 308 ug/dL (ref 250–450)
UIBC: 229 ug/dL

## 2021-04-13 LAB — POCT HEMOGLOBIN-HEMACUE: Hemoglobin: 9.6 g/dL — ABNORMAL LOW (ref 13.0–17.0)

## 2021-04-13 MED ORDER — EPOETIN ALFA-EPBX 3000 UNIT/ML IJ SOLN
3000.0000 [IU] | Freq: Once | INTRAMUSCULAR | Status: AC
  Administered 2021-04-13: 3000 [IU] via SUBCUTANEOUS

## 2021-04-13 MED ORDER — EPOETIN ALFA-EPBX 3000 UNIT/ML IJ SOLN
INTRAMUSCULAR | Status: AC
  Filled 2021-04-13: qty 1

## 2021-04-14 LAB — PTH, INTACT AND CALCIUM
Calcium, Total (PTH): 8.8 mg/dL (ref 8.6–10.2)
PTH: 75 pg/mL — ABNORMAL HIGH (ref 15–65)

## 2021-04-20 DIAGNOSIS — G4733 Obstructive sleep apnea (adult) (pediatric): Secondary | ICD-10-CM | POA: Diagnosis not present

## 2021-04-20 DIAGNOSIS — J449 Chronic obstructive pulmonary disease, unspecified: Secondary | ICD-10-CM | POA: Diagnosis not present

## 2021-04-20 DIAGNOSIS — I509 Heart failure, unspecified: Secondary | ICD-10-CM | POA: Diagnosis not present

## 2021-04-20 DIAGNOSIS — G4736 Sleep related hypoventilation in conditions classified elsewhere: Secondary | ICD-10-CM | POA: Diagnosis not present

## 2021-04-27 ENCOUNTER — Other Ambulatory Visit: Payer: Self-pay

## 2021-04-27 ENCOUNTER — Encounter (HOSPITAL_COMMUNITY)
Admission: RE | Admit: 2021-04-27 | Discharge: 2021-04-27 | Disposition: A | Discharge status: Home / Self Care | Payer: Medicare HMO | Source: Ambulatory Visit | Attending: Nephrology | Admitting: Nephrology

## 2021-04-27 DIAGNOSIS — D631 Anemia in chronic kidney disease: Secondary | ICD-10-CM | POA: Diagnosis not present

## 2021-04-27 DIAGNOSIS — N184 Chronic kidney disease, stage 4 (severe): Secondary | ICD-10-CM | POA: Diagnosis not present

## 2021-04-27 LAB — POCT HEMOGLOBIN-HEMACUE: Hemoglobin: 9.3 g/dL — ABNORMAL LOW (ref 13.0–17.0)

## 2021-04-27 MED ORDER — EPOETIN ALFA-EPBX 3000 UNIT/ML IJ SOLN
INTRAMUSCULAR | Status: AC
  Filled 2021-04-27: qty 1

## 2021-04-27 MED ORDER — EPOETIN ALFA-EPBX 3000 UNIT/ML IJ SOLN
3000.0000 [IU] | Freq: Once | INTRAMUSCULAR | Status: AC
Start: 2021-04-27 — End: 2021-04-27
  Administered 2021-04-27: 3000 [IU] via SUBCUTANEOUS

## 2021-04-29 DIAGNOSIS — E7849 Other hyperlipidemia: Secondary | ICD-10-CM | POA: Diagnosis not present

## 2021-04-29 DIAGNOSIS — E1122 Type 2 diabetes mellitus with diabetic chronic kidney disease: Secondary | ICD-10-CM | POA: Diagnosis not present

## 2021-04-29 DIAGNOSIS — N184 Chronic kidney disease, stage 4 (severe): Secondary | ICD-10-CM | POA: Diagnosis not present

## 2021-04-29 DIAGNOSIS — I129 Hypertensive chronic kidney disease with stage 1 through stage 4 chronic kidney disease, or unspecified chronic kidney disease: Secondary | ICD-10-CM | POA: Diagnosis not present

## 2021-05-06 DIAGNOSIS — J449 Chronic obstructive pulmonary disease, unspecified: Secondary | ICD-10-CM | POA: Diagnosis not present

## 2021-05-07 DIAGNOSIS — I5032 Chronic diastolic (congestive) heart failure: Secondary | ICD-10-CM | POA: Diagnosis not present

## 2021-05-07 DIAGNOSIS — E211 Secondary hyperparathyroidism, not elsewhere classified: Secondary | ICD-10-CM | POA: Diagnosis not present

## 2021-05-07 DIAGNOSIS — N189 Chronic kidney disease, unspecified: Secondary | ICD-10-CM | POA: Diagnosis not present

## 2021-05-07 DIAGNOSIS — E1122 Type 2 diabetes mellitus with diabetic chronic kidney disease: Secondary | ICD-10-CM | POA: Diagnosis not present

## 2021-05-07 DIAGNOSIS — I129 Hypertensive chronic kidney disease with stage 1 through stage 4 chronic kidney disease, or unspecified chronic kidney disease: Secondary | ICD-10-CM | POA: Diagnosis not present

## 2021-05-07 DIAGNOSIS — E871 Hypo-osmolality and hyponatremia: Secondary | ICD-10-CM | POA: Diagnosis not present

## 2021-05-07 DIAGNOSIS — D631 Anemia in chronic kidney disease: Secondary | ICD-10-CM | POA: Diagnosis not present

## 2021-05-11 ENCOUNTER — Encounter (HOSPITAL_COMMUNITY)
Admission: RE | Admit: 2021-05-11 | Discharge: 2021-05-11 | Disposition: A | Discharge status: Home / Self Care | Payer: Medicare HMO | Source: Ambulatory Visit | Attending: Nephrology | Admitting: Nephrology

## 2021-05-11 ENCOUNTER — Other Ambulatory Visit: Payer: Self-pay

## 2021-05-11 DIAGNOSIS — D631 Anemia in chronic kidney disease: Secondary | ICD-10-CM | POA: Diagnosis not present

## 2021-05-11 DIAGNOSIS — N184 Chronic kidney disease, stage 4 (severe): Secondary | ICD-10-CM | POA: Insufficient documentation

## 2021-05-11 LAB — POCT HEMOGLOBIN-HEMACUE: Hemoglobin: 9.4 g/dL — ABNORMAL LOW (ref 13.0–17.0)

## 2021-05-11 MED ORDER — EPOETIN ALFA-EPBX 3000 UNIT/ML IJ SOLN
INTRAMUSCULAR | Status: AC
  Administered 2021-05-11: 3000 [IU] via SUBCUTANEOUS
  Filled 2021-05-11: qty 1

## 2021-05-18 DIAGNOSIS — G4733 Obstructive sleep apnea (adult) (pediatric): Secondary | ICD-10-CM | POA: Diagnosis not present

## 2021-05-20 DIAGNOSIS — J449 Chronic obstructive pulmonary disease, unspecified: Secondary | ICD-10-CM | POA: Diagnosis not present

## 2021-05-20 DIAGNOSIS — I509 Heart failure, unspecified: Secondary | ICD-10-CM | POA: Diagnosis not present

## 2021-05-25 ENCOUNTER — Encounter (HOSPITAL_COMMUNITY)
Admission: RE | Admit: 2021-05-25 | Discharge: 2021-05-25 | Disposition: A | Discharge status: Home / Self Care | Payer: Medicare HMO | Source: Ambulatory Visit | Attending: Nephrology | Admitting: Nephrology

## 2021-05-25 ENCOUNTER — Other Ambulatory Visit: Payer: Self-pay

## 2021-05-25 DIAGNOSIS — D631 Anemia in chronic kidney disease: Secondary | ICD-10-CM | POA: Diagnosis not present

## 2021-05-25 DIAGNOSIS — N184 Chronic kidney disease, stage 4 (severe): Secondary | ICD-10-CM | POA: Diagnosis not present

## 2021-05-25 LAB — POCT HEMOGLOBIN-HEMACUE: Hemoglobin: 10 g/dL — ABNORMAL LOW (ref 13.0–17.0)

## 2021-05-25 NOTE — Progress Notes (Signed)
Patient refuses to have the monthly labs done for Dr. Alexander Mt done today. Hemocue 10.0 today, no injection given as indicated. Next appointment 06/08/2021 Patient is informed that the monthly labs will be done on this appointment. Agreeable.

## 2021-05-30 DIAGNOSIS — N184 Chronic kidney disease, stage 4 (severe): Secondary | ICD-10-CM | POA: Diagnosis not present

## 2021-05-30 DIAGNOSIS — I129 Hypertensive chronic kidney disease with stage 1 through stage 4 chronic kidney disease, or unspecified chronic kidney disease: Secondary | ICD-10-CM | POA: Diagnosis not present

## 2021-05-30 DIAGNOSIS — E1122 Type 2 diabetes mellitus with diabetic chronic kidney disease: Secondary | ICD-10-CM | POA: Diagnosis not present

## 2021-05-30 DIAGNOSIS — E7849 Other hyperlipidemia: Secondary | ICD-10-CM | POA: Diagnosis not present

## 2021-06-06 DIAGNOSIS — J449 Chronic obstructive pulmonary disease, unspecified: Secondary | ICD-10-CM | POA: Diagnosis not present

## 2021-06-08 ENCOUNTER — Other Ambulatory Visit: Payer: Self-pay

## 2021-06-08 ENCOUNTER — Encounter (HOSPITAL_COMMUNITY)
Admission: RE | Admit: 2021-06-08 | Discharge: 2021-06-08 | Disposition: A | Discharge status: Home / Self Care | Payer: Medicare HMO | Source: Ambulatory Visit | Attending: Nephrology | Admitting: Nephrology

## 2021-06-08 DIAGNOSIS — N184 Chronic kidney disease, stage 4 (severe): Secondary | ICD-10-CM | POA: Diagnosis present

## 2021-06-08 LAB — RENAL FUNCTION PANEL
Albumin: 3.9 g/dL (ref 3.5–5.0)
Anion gap: 7 (ref 5–15)
BUN: 31 mg/dL — ABNORMAL HIGH (ref 8–23)
CO2: 26 mmol/L (ref 22–32)
Calcium: 8.7 mg/dL — ABNORMAL LOW (ref 8.9–10.3)
Chloride: 104 mmol/L (ref 98–111)
Creatinine, Ser: 2.56 mg/dL — ABNORMAL HIGH (ref 0.61–1.24)
GFR, Estimated: 25 mL/min — ABNORMAL LOW (ref >60)
Glucose, Bld: 93 mg/dL (ref 70–99)
Phosphorus: 4.1 mg/dL (ref 2.5–4.6)
Potassium: 3.9 mmol/L (ref 3.5–5.1)
Sodium: 137 mmol/L (ref 135–145)

## 2021-06-08 LAB — CBC WITH DIFFERENTIAL/PLATELET
Abs Immature Granulocytes: 0.01 10*3/uL (ref 0.00–0.07)
Basophils Absolute: 0 10*3/uL (ref 0.0–0.1)
Basophils Relative: 1 %
Eosinophils Absolute: 0.2 10*3/uL (ref 0.0–0.5)
Eosinophils Relative: 4 %
HCT: 30.2 % — ABNORMAL LOW (ref 39.0–52.0)
Hemoglobin: 10 g/dL — ABNORMAL LOW (ref 13.0–17.0)
Immature Granulocytes: 0 %
Lymphocytes Relative: 35 %
Lymphs Abs: 1.7 10*3/uL (ref 0.7–4.0)
MCH: 28.6 pg (ref 26.0–34.0)
MCHC: 33.1 g/dL (ref 30.0–36.0)
MCV: 86.3 fL (ref 80.0–100.0)
Monocytes Absolute: 0.5 10*3/uL (ref 0.1–1.0)
Monocytes Relative: 11 %
Neutro Abs: 2.4 10*3/uL (ref 1.7–7.7)
Neutrophils Relative %: 49 %
Platelets: 175 10*3/uL (ref 150–400)
RBC: 3.5 MIL/uL — ABNORMAL LOW (ref 4.22–5.81)
RDW: 14.6 % (ref 11.5–15.5)
WBC: 4.9 10*3/uL (ref 4.0–10.5)
nRBC: 0 % (ref 0.0–0.2)

## 2021-06-08 LAB — IRON AND TIBC
Iron: 72 ug/dL (ref 45–182)
Saturation Ratios: 22 % (ref 17.9–39.5)
TIBC: 326 ug/dL (ref 250–450)
UIBC: 254 ug/dL

## 2021-06-08 LAB — POCT HEMOGLOBIN-HEMACUE: Hemoglobin: 10 g/dL — ABNORMAL LOW (ref 13.0–17.0)

## 2021-06-08 LAB — PROTEIN / CREATININE RATIO, URINE
Creatinine, Urine: 162.08 mg/dL
Protein Creatinine Ratio: 0.22 mg/mg{Cre} — ABNORMAL HIGH (ref 0.00–0.15)
Total Protein, Urine: 36 mg/dL

## 2021-06-09 LAB — PTH, INTACT AND CALCIUM
Calcium, Total (PTH): 8.9 mg/dL (ref 8.6–10.2)
PTH: 71 pg/mL — ABNORMAL HIGH (ref 15–65)

## 2021-06-18 DIAGNOSIS — G4733 Obstructive sleep apnea (adult) (pediatric): Secondary | ICD-10-CM | POA: Diagnosis not present

## 2021-06-20 DIAGNOSIS — J449 Chronic obstructive pulmonary disease, unspecified: Secondary | ICD-10-CM | POA: Diagnosis not present

## 2021-06-20 DIAGNOSIS — I509 Heart failure, unspecified: Secondary | ICD-10-CM | POA: Diagnosis not present

## 2021-06-22 ENCOUNTER — Encounter (HOSPITAL_COMMUNITY): Payer: Self-pay

## 2021-06-22 ENCOUNTER — Other Ambulatory Visit: Payer: Self-pay

## 2021-06-22 ENCOUNTER — Encounter (HOSPITAL_COMMUNITY)
Admission: RE | Admit: 2021-06-22 | Discharge: 2021-06-22 | Disposition: A | Discharge status: Home / Self Care | Payer: Medicare HMO | Source: Ambulatory Visit | Attending: Nephrology | Admitting: Nephrology

## 2021-06-22 DIAGNOSIS — N184 Chronic kidney disease, stage 4 (severe): Secondary | ICD-10-CM | POA: Diagnosis not present

## 2021-06-22 LAB — POCT HEMOGLOBIN-HEMACUE: Hemoglobin: 9.5 g/dL — ABNORMAL LOW (ref 13.0–17.0)

## 2021-06-22 MED ORDER — EPOETIN ALFA-EPBX 3000 UNIT/ML IJ SOLN
3000.0000 [IU] | Freq: Once | INTRAMUSCULAR | Status: AC
  Administered 2021-06-22: 3000 [IU] via SUBCUTANEOUS

## 2021-06-22 MED ORDER — EPOETIN ALFA-EPBX 3000 UNIT/ML IJ SOLN
INTRAMUSCULAR | Status: AC
  Filled 2021-06-22: qty 1

## 2021-06-23 ENCOUNTER — Other Ambulatory Visit: Payer: Self-pay | Admitting: Internal Medicine

## 2021-06-29 ENCOUNTER — Other Ambulatory Visit: Payer: Self-pay | Admitting: Internal Medicine

## 2021-06-30 DIAGNOSIS — I129 Hypertensive chronic kidney disease with stage 1 through stage 4 chronic kidney disease, or unspecified chronic kidney disease: Secondary | ICD-10-CM | POA: Diagnosis not present

## 2021-06-30 DIAGNOSIS — E1122 Type 2 diabetes mellitus with diabetic chronic kidney disease: Secondary | ICD-10-CM | POA: Diagnosis not present

## 2021-06-30 DIAGNOSIS — E7849 Other hyperlipidemia: Secondary | ICD-10-CM | POA: Diagnosis not present

## 2021-06-30 DIAGNOSIS — N184 Chronic kidney disease, stage 4 (severe): Secondary | ICD-10-CM | POA: Diagnosis not present

## 2021-07-06 ENCOUNTER — Other Ambulatory Visit: Payer: Self-pay

## 2021-07-06 ENCOUNTER — Encounter (HOSPITAL_COMMUNITY)
Admission: RE | Admit: 2021-07-06 | Discharge: 2021-07-06 | Disposition: A | Discharge status: Home / Self Care | Payer: Medicare HMO | Source: Ambulatory Visit | Attending: Nephrology | Admitting: Nephrology

## 2021-07-06 DIAGNOSIS — D631 Anemia in chronic kidney disease: Secondary | ICD-10-CM | POA: Insufficient documentation

## 2021-07-06 DIAGNOSIS — N184 Chronic kidney disease, stage 4 (severe): Secondary | ICD-10-CM | POA: Diagnosis present

## 2021-07-06 LAB — CBC WITH DIFFERENTIAL/PLATELET
Abs Immature Granulocytes: 0.01 10*3/uL (ref 0.00–0.07)
Basophils Absolute: 0 10*3/uL (ref 0.0–0.1)
Basophils Relative: 0 %
Eosinophils Absolute: 0.2 10*3/uL (ref 0.0–0.5)
Eosinophils Relative: 4 %
HCT: 31.9 % — ABNORMAL LOW (ref 39.0–52.0)
Hemoglobin: 10.4 g/dL — ABNORMAL LOW (ref 13.0–17.0)
Immature Granulocytes: 0 %
Lymphocytes Relative: 37 %
Lymphs Abs: 1.7 10*3/uL (ref 0.7–4.0)
MCH: 28.2 pg (ref 26.0–34.0)
MCHC: 32.6 g/dL (ref 30.0–36.0)
MCV: 86.4 fL (ref 80.0–100.0)
Monocytes Absolute: 0.4 10*3/uL (ref 0.1–1.0)
Monocytes Relative: 9 %
Neutro Abs: 2.3 10*3/uL (ref 1.7–7.7)
Neutrophils Relative %: 50 %
Platelets: 164 10*3/uL (ref 150–400)
RBC: 3.69 MIL/uL — ABNORMAL LOW (ref 4.22–5.81)
RDW: 14.2 % (ref 11.5–15.5)
WBC: 4.7 10*3/uL (ref 4.0–10.5)
nRBC: 0 % (ref 0.0–0.2)

## 2021-07-06 LAB — RENAL FUNCTION PANEL
Albumin: 4.1 g/dL (ref 3.5–5.0)
Anion gap: 6 (ref 5–15)
BUN: 31 mg/dL — ABNORMAL HIGH (ref 8–23)
CO2: 27 mmol/L (ref 22–32)
Calcium: 9 mg/dL (ref 8.9–10.3)
Chloride: 108 mmol/L (ref 98–111)
Creatinine, Ser: 2.82 mg/dL — ABNORMAL HIGH (ref 0.61–1.24)
GFR, Estimated: 22 mL/min — ABNORMAL LOW (ref >60)
Glucose, Bld: 156 mg/dL — ABNORMAL HIGH (ref 70–99)
Phosphorus: 4.1 mg/dL (ref 2.5–4.6)
Potassium: 3.8 mmol/L (ref 3.5–5.1)
Sodium: 141 mmol/L (ref 135–145)

## 2021-07-06 LAB — IRON AND TIBC
Iron: 81 ug/dL (ref 45–182)
Saturation Ratios: 24 % (ref 17.9–39.5)
TIBC: 338 ug/dL (ref 250–450)
UIBC: 257 ug/dL

## 2021-07-06 LAB — PROTEIN / CREATININE RATIO, URINE
Creatinine, Urine: 216.21 mg/dL
Protein Creatinine Ratio: 0.29 mg/mg{Cre} — ABNORMAL HIGH (ref 0.00–0.15)
Total Protein, Urine: 63 mg/dL

## 2021-07-06 LAB — POCT HEMOGLOBIN-HEMACUE: Hemoglobin: 10.4 g/dL — ABNORMAL LOW (ref 13.0–17.0)

## 2021-07-06 MED ORDER — EPOETIN ALFA-EPBX 3000 UNIT/ML IJ SOLN: 3000.0000 [IU] | Freq: Once | INTRAMUSCULAR | Status: DC

## 2021-07-07 DIAGNOSIS — J449 Chronic obstructive pulmonary disease, unspecified: Secondary | ICD-10-CM | POA: Diagnosis not present

## 2021-07-07 LAB — PTH, INTACT AND CALCIUM
Calcium, Total (PTH): 9.2 mg/dL (ref 8.6–10.2)
PTH: 63 pg/mL (ref 15–65)

## 2021-07-09 ENCOUNTER — Other Ambulatory Visit: Payer: Self-pay | Admitting: Nephrology

## 2021-07-09 ENCOUNTER — Other Ambulatory Visit (HOSPITAL_COMMUNITY): Payer: Self-pay | Admitting: Nephrology

## 2021-07-09 DIAGNOSIS — E211 Secondary hyperparathyroidism, not elsewhere classified: Secondary | ICD-10-CM | POA: Diagnosis not present

## 2021-07-09 DIAGNOSIS — N170 Acute kidney failure with tubular necrosis: Secondary | ICD-10-CM | POA: Diagnosis not present

## 2021-07-09 DIAGNOSIS — E1122 Type 2 diabetes mellitus with diabetic chronic kidney disease: Secondary | ICD-10-CM

## 2021-07-09 DIAGNOSIS — D631 Anemia in chronic kidney disease: Secondary | ICD-10-CM | POA: Diagnosis not present

## 2021-07-09 DIAGNOSIS — I5032 Chronic diastolic (congestive) heart failure: Secondary | ICD-10-CM | POA: Diagnosis not present

## 2021-07-09 DIAGNOSIS — I129 Hypertensive chronic kidney disease with stage 1 through stage 4 chronic kidney disease, or unspecified chronic kidney disease: Secondary | ICD-10-CM | POA: Diagnosis not present

## 2021-07-09 DIAGNOSIS — N189 Chronic kidney disease, unspecified: Secondary | ICD-10-CM | POA: Diagnosis not present

## 2021-07-19 DIAGNOSIS — G4733 Obstructive sleep apnea (adult) (pediatric): Secondary | ICD-10-CM | POA: Diagnosis not present

## 2021-07-20 ENCOUNTER — Other Ambulatory Visit: Payer: Self-pay

## 2021-07-20 ENCOUNTER — Encounter (HOSPITAL_COMMUNITY)
Admission: RE | Admit: 2021-07-20 | Discharge: 2021-07-20 | Disposition: A | Discharge status: Home / Self Care | Payer: Medicare HMO | Source: Ambulatory Visit | Attending: Nephrology | Admitting: Nephrology

## 2021-07-20 DIAGNOSIS — D631 Anemia in chronic kidney disease: Secondary | ICD-10-CM | POA: Diagnosis not present

## 2021-07-20 DIAGNOSIS — N184 Chronic kidney disease, stage 4 (severe): Secondary | ICD-10-CM | POA: Diagnosis not present

## 2021-07-20 LAB — POCT HEMOGLOBIN-HEMACUE: Hemoglobin: 9.5 g/dL — ABNORMAL LOW (ref 13.0–17.0)

## 2021-07-20 MED ORDER — EPOETIN ALFA-EPBX 3000 UNIT/ML IJ SOLN
3000.0000 [IU] | Freq: Once | INTRAMUSCULAR | Status: AC
  Administered 2021-07-20: 3000 [IU] via SUBCUTANEOUS

## 2021-07-20 MED ORDER — EPOETIN ALFA-EPBX 3000 UNIT/ML IJ SOLN
INTRAMUSCULAR | Status: AC
  Filled 2021-07-20: qty 1

## 2021-07-21 DIAGNOSIS — J449 Chronic obstructive pulmonary disease, unspecified: Secondary | ICD-10-CM | POA: Diagnosis not present

## 2021-07-21 DIAGNOSIS — I509 Heart failure, unspecified: Secondary | ICD-10-CM | POA: Diagnosis not present

## 2021-07-26 ENCOUNTER — Ambulatory Visit (HOSPITAL_COMMUNITY)
Admission: RE | Admit: 2021-07-26 | Discharge: 2021-07-26 | Disposition: A | Discharge status: Home / Self Care | Payer: Medicare HMO | Source: Ambulatory Visit | Attending: Nephrology | Admitting: Nephrology

## 2021-07-26 ENCOUNTER — Other Ambulatory Visit: Payer: Self-pay

## 2021-07-26 DIAGNOSIS — N179 Acute kidney failure, unspecified: Secondary | ICD-10-CM | POA: Diagnosis not present

## 2021-07-26 DIAGNOSIS — D631 Anemia in chronic kidney disease: Secondary | ICD-10-CM | POA: Insufficient documentation

## 2021-07-26 DIAGNOSIS — N281 Cyst of kidney, acquired: Secondary | ICD-10-CM | POA: Diagnosis not present

## 2021-07-26 DIAGNOSIS — N170 Acute kidney failure with tubular necrosis: Secondary | ICD-10-CM | POA: Diagnosis not present

## 2021-07-26 DIAGNOSIS — N189 Chronic kidney disease, unspecified: Secondary | ICD-10-CM | POA: Diagnosis not present

## 2021-07-26 DIAGNOSIS — E1122 Type 2 diabetes mellitus with diabetic chronic kidney disease: Secondary | ICD-10-CM | POA: Diagnosis not present

## 2021-07-30 DIAGNOSIS — I129 Hypertensive chronic kidney disease with stage 1 through stage 4 chronic kidney disease, or unspecified chronic kidney disease: Secondary | ICD-10-CM | POA: Diagnosis not present

## 2021-07-30 DIAGNOSIS — E7849 Other hyperlipidemia: Secondary | ICD-10-CM | POA: Diagnosis not present

## 2021-07-30 DIAGNOSIS — N184 Chronic kidney disease, stage 4 (severe): Secondary | ICD-10-CM | POA: Diagnosis not present

## 2021-07-30 DIAGNOSIS — E1122 Type 2 diabetes mellitus with diabetic chronic kidney disease: Secondary | ICD-10-CM | POA: Diagnosis not present

## 2021-08-03 ENCOUNTER — Encounter (HOSPITAL_COMMUNITY): Payer: Self-pay

## 2021-08-03 ENCOUNTER — Encounter (HOSPITAL_COMMUNITY)
Admission: RE | Admit: 2021-08-03 | Discharge: 2021-08-03 | Disposition: A | Discharge status: Home / Self Care | Payer: Medicare HMO | Source: Ambulatory Visit | Attending: Nephrology | Admitting: Nephrology

## 2021-08-03 DIAGNOSIS — Z0100 Encounter for examination of eyes and vision without abnormal findings: Secondary | ICD-10-CM | POA: Diagnosis not present

## 2021-08-03 DIAGNOSIS — E119 Type 2 diabetes mellitus without complications: Secondary | ICD-10-CM | POA: Diagnosis not present

## 2021-08-03 DIAGNOSIS — D631 Anemia in chronic kidney disease: Secondary | ICD-10-CM | POA: Insufficient documentation

## 2021-08-03 DIAGNOSIS — Z961 Presence of intraocular lens: Secondary | ICD-10-CM | POA: Diagnosis not present

## 2021-08-03 DIAGNOSIS — N184 Chronic kidney disease, stage 4 (severe): Secondary | ICD-10-CM | POA: Insufficient documentation

## 2021-08-03 DIAGNOSIS — H35371 Puckering of macula, right eye: Secondary | ICD-10-CM | POA: Diagnosis not present

## 2021-08-03 DIAGNOSIS — Z7984 Long term (current) use of oral hypoglycemic drugs: Secondary | ICD-10-CM | POA: Diagnosis not present

## 2021-08-03 DIAGNOSIS — H524 Presbyopia: Secondary | ICD-10-CM | POA: Diagnosis not present

## 2021-08-03 LAB — RENAL FUNCTION PANEL
Albumin: 3.7 g/dL (ref 3.5–5.0)
Anion gap: 8 (ref 5–15)
BUN: 27 mg/dL — ABNORMAL HIGH (ref 8–23)
CO2: 28 mmol/L (ref 22–32)
Calcium: 8.3 mg/dL — ABNORMAL LOW (ref 8.9–10.3)
Chloride: 100 mmol/L (ref 98–111)
Creatinine, Ser: 2.2 mg/dL — ABNORMAL HIGH (ref 0.61–1.24)
GFR, Estimated: 30 mL/min — ABNORMAL LOW (ref >60)
Glucose, Bld: 153 mg/dL — ABNORMAL HIGH (ref 70–99)
Phosphorus: 4.1 mg/dL (ref 2.5–4.6)
Potassium: 3.6 mmol/L (ref 3.5–5.1)
Sodium: 136 mmol/L (ref 135–145)

## 2021-08-03 LAB — PROTEIN / CREATININE RATIO, URINE
Creatinine, Urine: 144.57 mg/dL
Protein Creatinine Ratio: 0.37 mg/mg{Cre} — ABNORMAL HIGH (ref 0.00–0.15)
Total Protein, Urine: 54 mg/dL

## 2021-08-03 LAB — CBC
HCT: 29.5 % — ABNORMAL LOW (ref 39.0–52.0)
Hemoglobin: 9.6 g/dL — ABNORMAL LOW (ref 13.0–17.0)
MCH: 27.7 pg (ref 26.0–34.0)
MCHC: 32.5 g/dL (ref 30.0–36.0)
MCV: 85.3 fL (ref 80.0–100.0)
Platelets: 142 10*3/uL — ABNORMAL LOW (ref 150–400)
RBC: 3.46 MIL/uL — ABNORMAL LOW (ref 4.22–5.81)
RDW: 13.7 % (ref 11.5–15.5)
WBC: 4.4 10*3/uL (ref 4.0–10.5)
nRBC: 0 % (ref 0.0–0.2)

## 2021-08-03 LAB — IRON AND TIBC
Iron: 68 ug/dL (ref 45–182)
Saturation Ratios: 23 % (ref 17.9–39.5)
TIBC: 297 ug/dL (ref 250–450)
UIBC: 229 ug/dL

## 2021-08-03 LAB — POCT HEMOGLOBIN-HEMACUE: Hemoglobin: 9.5 g/dL — ABNORMAL LOW (ref 13.0–17.0)

## 2021-08-03 MED ORDER — EPOETIN ALFA-EPBX 3000 UNIT/ML IJ SOLN
INTRAMUSCULAR | Status: AC
  Administered 2021-08-03: 3000 [IU] via SUBCUTANEOUS
  Filled 2021-08-03: qty 1

## 2021-08-03 MED ORDER — EPOETIN ALFA-EPBX 3000 UNIT/ML IJ SOLN: 3000.0000 [IU] | Freq: Once | INTRAMUSCULAR | Status: DC

## 2021-08-04 DIAGNOSIS — I129 Hypertensive chronic kidney disease with stage 1 through stage 4 chronic kidney disease, or unspecified chronic kidney disease: Secondary | ICD-10-CM | POA: Diagnosis not present

## 2021-08-04 DIAGNOSIS — N281 Cyst of kidney, acquired: Secondary | ICD-10-CM | POA: Diagnosis not present

## 2021-08-04 DIAGNOSIS — D631 Anemia in chronic kidney disease: Secondary | ICD-10-CM | POA: Diagnosis not present

## 2021-08-04 DIAGNOSIS — I5033 Acute on chronic diastolic (congestive) heart failure: Secondary | ICD-10-CM | POA: Diagnosis not present

## 2021-08-04 DIAGNOSIS — N170 Acute kidney failure with tubular necrosis: Secondary | ICD-10-CM | POA: Diagnosis not present

## 2021-08-04 DIAGNOSIS — E1122 Type 2 diabetes mellitus with diabetic chronic kidney disease: Secondary | ICD-10-CM | POA: Diagnosis not present

## 2021-08-04 DIAGNOSIS — N189 Chronic kidney disease, unspecified: Secondary | ICD-10-CM | POA: Diagnosis not present

## 2021-08-04 LAB — PTH, INTACT AND CALCIUM
Calcium, Total (PTH): 8.7 mg/dL (ref 8.6–10.2)
PTH: 36 pg/mL (ref 15–65)

## 2021-08-06 DIAGNOSIS — J449 Chronic obstructive pulmonary disease, unspecified: Secondary | ICD-10-CM | POA: Diagnosis not present

## 2021-08-10 DIAGNOSIS — Z23 Encounter for immunization: Secondary | ICD-10-CM | POA: Diagnosis not present

## 2021-08-17 ENCOUNTER — Encounter (HOSPITAL_COMMUNITY)
Admission: RE | Admit: 2021-08-17 | Discharge: 2021-08-17 | Disposition: A | Discharge status: Home / Self Care | Payer: Medicare HMO | Source: Ambulatory Visit | Attending: Nephrology | Admitting: Nephrology

## 2021-08-17 ENCOUNTER — Other Ambulatory Visit: Payer: Self-pay

## 2021-08-17 DIAGNOSIS — D631 Anemia in chronic kidney disease: Secondary | ICD-10-CM | POA: Diagnosis not present

## 2021-08-17 DIAGNOSIS — N184 Chronic kidney disease, stage 4 (severe): Secondary | ICD-10-CM | POA: Diagnosis not present

## 2021-08-17 LAB — POCT HEMOGLOBIN-HEMACUE: Hemoglobin: 9.9 g/dL — ABNORMAL LOW (ref 13.0–17.0)

## 2021-08-17 MED ORDER — EPOETIN ALFA-EPBX 3000 UNIT/ML IJ SOLN
INTRAMUSCULAR | Status: AC
  Filled 2021-08-17: qty 1

## 2021-08-17 MED ORDER — EPOETIN ALFA-EPBX 3000 UNIT/ML IJ SOLN
3000.0000 [IU] | Freq: Once | INTRAMUSCULAR | Status: AC
  Administered 2021-08-17: 3000 [IU] via SUBCUTANEOUS

## 2021-08-20 DIAGNOSIS — J449 Chronic obstructive pulmonary disease, unspecified: Secondary | ICD-10-CM | POA: Diagnosis not present

## 2021-08-20 DIAGNOSIS — I509 Heart failure, unspecified: Secondary | ICD-10-CM | POA: Diagnosis not present

## 2021-08-30 DIAGNOSIS — N184 Chronic kidney disease, stage 4 (severe): Secondary | ICD-10-CM | POA: Diagnosis not present

## 2021-08-30 DIAGNOSIS — E1122 Type 2 diabetes mellitus with diabetic chronic kidney disease: Secondary | ICD-10-CM | POA: Diagnosis not present

## 2021-08-30 DIAGNOSIS — E7849 Other hyperlipidemia: Secondary | ICD-10-CM | POA: Diagnosis not present

## 2021-08-30 DIAGNOSIS — I129 Hypertensive chronic kidney disease with stage 1 through stage 4 chronic kidney disease, or unspecified chronic kidney disease: Secondary | ICD-10-CM | POA: Diagnosis not present

## 2021-08-31 ENCOUNTER — Encounter (HOSPITAL_COMMUNITY): Payer: Self-pay

## 2021-08-31 ENCOUNTER — Encounter (HOSPITAL_COMMUNITY)
Admission: RE | Admit: 2021-08-31 | Discharge: 2021-08-31 | Disposition: A | Discharge status: Home / Self Care | Payer: Medicare HMO | Source: Ambulatory Visit | Attending: Nephrology | Admitting: Nephrology

## 2021-08-31 DIAGNOSIS — D631 Anemia in chronic kidney disease: Secondary | ICD-10-CM | POA: Diagnosis not present

## 2021-08-31 DIAGNOSIS — N184 Chronic kidney disease, stage 4 (severe): Secondary | ICD-10-CM | POA: Insufficient documentation

## 2021-08-31 LAB — POCT HEMOGLOBIN-HEMACUE: Hemoglobin: 9.9 g/dL — ABNORMAL LOW (ref 13.0–17.0)

## 2021-08-31 MED ORDER — EPOETIN ALFA-EPBX 3000 UNIT/ML IJ SOLN
3000.0000 [IU] | Freq: Once | INTRAMUSCULAR | Status: AC
  Administered 2021-08-31: 3000 [IU] via SUBCUTANEOUS

## 2021-08-31 MED ORDER — EPOETIN ALFA-EPBX 3000 UNIT/ML IJ SOLN
INTRAMUSCULAR | Status: AC
  Filled 2021-08-31: qty 1

## 2021-09-02 LAB — POCT HEMOGLOBIN-HEMACUE: Hemoglobin: 14 g/dL (ref 13.0–17.0)

## 2021-09-06 DIAGNOSIS — J449 Chronic obstructive pulmonary disease, unspecified: Secondary | ICD-10-CM | POA: Diagnosis not present

## 2021-09-07 ENCOUNTER — Telehealth: Payer: Self-pay | Admitting: Pulmonary Disease

## 2021-09-08 NOTE — Telephone Encounter (Signed)
Patient is asking for an order to be sent to [facility] to stop portable oxygen. States he was told during last OV he could stop oxygen use during the day and he is still being charged for oxygen. Only using at night.   Dr. Halford Chessman please advise if it is okay to place order?

## 2021-09-09 NOTE — Telephone Encounter (Signed)
Okay to send order. 

## 2021-09-10 ENCOUNTER — Other Ambulatory Visit: Payer: Self-pay

## 2021-09-10 DIAGNOSIS — J9611 Chronic respiratory failure with hypoxia: Secondary | ICD-10-CM

## 2021-09-10 NOTE — Progress Notes (Signed)
F

## 2021-09-10 NOTE — Telephone Encounter (Signed)
Order sent for [employer] oxygen use to be discontinued,

## 2021-09-14 ENCOUNTER — Encounter (HOSPITAL_COMMUNITY)
Admission: RE | Admit: 2021-09-14 | Discharge: 2021-09-14 | Disposition: A | Discharge status: Home / Self Care | Payer: Medicare HMO | Source: Ambulatory Visit | Attending: Nephrology | Admitting: Nephrology

## 2021-09-14 ENCOUNTER — Other Ambulatory Visit: Payer: Self-pay

## 2021-09-14 DIAGNOSIS — D631 Anemia in chronic kidney disease: Secondary | ICD-10-CM | POA: Diagnosis not present

## 2021-09-14 DIAGNOSIS — N184 Chronic kidney disease, stage 4 (severe): Secondary | ICD-10-CM | POA: Diagnosis not present

## 2021-09-14 LAB — POCT HEMOGLOBIN-HEMACUE: Hemoglobin: 10.5 g/dL — ABNORMAL LOW (ref 13.0–17.0)

## 2021-09-14 MED ORDER — EPOETIN ALFA-EPBX 3000 UNIT/ML IJ SOLN: 3000.0000 [IU] | Freq: Once | INTRAMUSCULAR | Status: DC

## 2021-09-20 DIAGNOSIS — I509 Heart failure, unspecified: Secondary | ICD-10-CM | POA: Diagnosis not present

## 2021-09-20 DIAGNOSIS — J449 Chronic obstructive pulmonary disease, unspecified: Secondary | ICD-10-CM | POA: Diagnosis not present

## 2021-09-28 ENCOUNTER — Encounter (HOSPITAL_COMMUNITY)
Admission: RE | Admit: 2021-09-28 | Discharge: 2021-09-28 | Disposition: A | Discharge status: Home / Self Care | Payer: Medicare HMO | Source: Ambulatory Visit | Attending: Nephrology | Admitting: Nephrology

## 2021-09-28 ENCOUNTER — Other Ambulatory Visit: Payer: Self-pay

## 2021-09-28 ENCOUNTER — Encounter (HOSPITAL_COMMUNITY): Payer: Self-pay

## 2021-09-28 ENCOUNTER — Encounter (HOSPITAL_COMMUNITY): Payer: Medicare HMO

## 2021-09-28 VITALS — BP 152/90 | HR 73 | Temp 98.2°F | Resp 18 | Ht 69.0 in | Wt 211.0 lb

## 2021-09-28 DIAGNOSIS — D631 Anemia in chronic kidney disease: Secondary | ICD-10-CM | POA: Diagnosis not present

## 2021-09-28 DIAGNOSIS — N184 Chronic kidney disease, stage 4 (severe): Secondary | ICD-10-CM | POA: Diagnosis not present

## 2021-09-28 DIAGNOSIS — N182 Chronic kidney disease, stage 2 (mild): Secondary | ICD-10-CM

## 2021-09-28 LAB — RENAL FUNCTION PANEL
Albumin: 3.7 g/dL (ref 3.5–5.0)
Anion gap: 8 (ref 5–15)
BUN: 26 mg/dL — ABNORMAL HIGH (ref 8–23)
CO2: 26 mmol/L (ref 22–32)
Calcium: 8.5 mg/dL — ABNORMAL LOW (ref 8.9–10.3)
Chloride: 102 mmol/L (ref 98–111)
Creatinine, Ser: 2.27 mg/dL — ABNORMAL HIGH (ref 0.61–1.24)
GFR, Estimated: 29 mL/min — ABNORMAL LOW (ref >60)
Glucose, Bld: 131 mg/dL — ABNORMAL HIGH (ref 70–99)
Phosphorus: 4.3 mg/dL (ref 2.5–4.6)
Potassium: 3.7 mmol/L (ref 3.5–5.1)
Sodium: 136 mmol/L (ref 135–145)

## 2021-09-28 LAB — CBC
HCT: 30.1 % — ABNORMAL LOW (ref 39.0–52.0)
Hemoglobin: 10.2 g/dL — ABNORMAL LOW (ref 13.0–17.0)
MCH: 28 pg (ref 26.0–34.0)
MCHC: 33.9 g/dL (ref 30.0–36.0)
MCV: 82.7 fL (ref 80.0–100.0)
Platelets: 164 10*3/uL (ref 150–400)
RBC: 3.64 MIL/uL — ABNORMAL LOW (ref 4.22–5.81)
RDW: 13.5 % (ref 11.5–15.5)
WBC: 5.1 10*3/uL (ref 4.0–10.5)
nRBC: 0 % (ref 0.0–0.2)

## 2021-09-28 LAB — IRON AND TIBC
Iron: 62 ug/dL (ref 45–182)
Saturation Ratios: 21 % (ref 17.9–39.5)
TIBC: 296 ug/dL (ref 250–450)
UIBC: 234 ug/dL

## 2021-09-28 LAB — PROTEIN / CREATININE RATIO, URINE
Creatinine, Urine: 172.78 mg/dL
Protein Creatinine Ratio: 0.45 mg/mg{Cre} — ABNORMAL HIGH (ref 0.00–0.15)
Total Protein, Urine: 77 mg/dL

## 2021-09-28 LAB — POCT HEMOGLOBIN-HEMACUE: Hemoglobin: 10.5 g/dL — ABNORMAL LOW (ref 13.0–17.0)

## 2021-09-28 MED ORDER — EPOETIN ALFA-EPBX 3000 UNIT/ML IJ SOLN: 3000.0000 [IU] | Freq: Once | INTRAMUSCULAR | Status: DC

## 2021-09-29 DIAGNOSIS — E1122 Type 2 diabetes mellitus with diabetic chronic kidney disease: Secondary | ICD-10-CM | POA: Diagnosis not present

## 2021-09-29 DIAGNOSIS — I129 Hypertensive chronic kidney disease with stage 1 through stage 4 chronic kidney disease, or unspecified chronic kidney disease: Secondary | ICD-10-CM | POA: Diagnosis not present

## 2021-09-29 DIAGNOSIS — N184 Chronic kidney disease, stage 4 (severe): Secondary | ICD-10-CM | POA: Diagnosis not present

## 2021-09-29 DIAGNOSIS — E7849 Other hyperlipidemia: Secondary | ICD-10-CM | POA: Diagnosis not present

## 2021-09-29 LAB — PTH, INTACT AND CALCIUM
Calcium, Total (PTH): 8.5 mg/dL — ABNORMAL LOW (ref 8.6–10.2)
PTH: 66 pg/mL — ABNORMAL HIGH (ref 15–65)

## 2021-10-06 DIAGNOSIS — R809 Proteinuria, unspecified: Secondary | ICD-10-CM | POA: Diagnosis not present

## 2021-10-06 DIAGNOSIS — I5032 Chronic diastolic (congestive) heart failure: Secondary | ICD-10-CM | POA: Diagnosis not present

## 2021-10-06 DIAGNOSIS — D631 Anemia in chronic kidney disease: Secondary | ICD-10-CM | POA: Diagnosis not present

## 2021-10-06 DIAGNOSIS — N189 Chronic kidney disease, unspecified: Secondary | ICD-10-CM | POA: Diagnosis not present

## 2021-10-06 DIAGNOSIS — E1122 Type 2 diabetes mellitus with diabetic chronic kidney disease: Secondary | ICD-10-CM | POA: Diagnosis not present

## 2021-10-06 DIAGNOSIS — I129 Hypertensive chronic kidney disease with stage 1 through stage 4 chronic kidney disease, or unspecified chronic kidney disease: Secondary | ICD-10-CM | POA: Diagnosis not present

## 2021-10-06 DIAGNOSIS — E1129 Type 2 diabetes mellitus with other diabetic kidney complication: Secondary | ICD-10-CM | POA: Diagnosis not present

## 2021-10-06 DIAGNOSIS — E211 Secondary hyperparathyroidism, not elsewhere classified: Secondary | ICD-10-CM | POA: Diagnosis not present

## 2021-10-12 ENCOUNTER — Encounter (HOSPITAL_COMMUNITY): Admission: RE | Admit: 2021-10-12 | Payer: Medicare HMO | Source: Ambulatory Visit

## 2021-10-12 ENCOUNTER — Encounter (HOSPITAL_COMMUNITY)
Admission: RE | Admit: 2021-10-12 | Discharge: 2021-10-12 | Disposition: A | Discharge status: Home / Self Care | Payer: Medicare HMO | Source: Ambulatory Visit | Attending: Nephrology | Admitting: Nephrology

## 2021-10-12 ENCOUNTER — Encounter (HOSPITAL_COMMUNITY): Payer: Medicare HMO

## 2021-10-12 ENCOUNTER — Other Ambulatory Visit: Payer: Self-pay

## 2021-10-12 ENCOUNTER — Encounter (HOSPITAL_COMMUNITY): Payer: Self-pay

## 2021-10-12 DIAGNOSIS — N184 Chronic kidney disease, stage 4 (severe): Secondary | ICD-10-CM | POA: Diagnosis present

## 2021-10-12 DIAGNOSIS — D631 Anemia in chronic kidney disease: Secondary | ICD-10-CM | POA: Insufficient documentation

## 2021-10-12 LAB — POCT HEMOGLOBIN-HEMACUE: Hemoglobin: 11 g/dL — ABNORMAL LOW (ref 13.0–17.0)

## 2021-10-12 MED ORDER — EPOETIN ALFA-EPBX 3000 UNIT/ML IJ SOLN: 3000.0000 [IU] | Freq: Once | INTRAMUSCULAR | Status: DC

## 2021-10-15 DIAGNOSIS — G4733 Obstructive sleep apnea (adult) (pediatric): Secondary | ICD-10-CM | POA: Diagnosis not present

## 2021-10-17 ENCOUNTER — Emergency Department (HOSPITAL_COMMUNITY): Payer: Medicare HMO

## 2021-10-17 ENCOUNTER — Inpatient Hospital Stay (HOSPITAL_COMMUNITY)
Admission: EM | Admit: 2021-10-17 | Discharge: 2021-10-23 | DRG: 389 | Disposition: A | Discharge status: Home / Self Care | Payer: Medicare HMO | Attending: General Surgery | Admitting: General Surgery

## 2021-10-17 ENCOUNTER — Other Ambulatory Visit: Payer: Self-pay

## 2021-10-17 ENCOUNTER — Encounter (HOSPITAL_COMMUNITY): Payer: Self-pay | Admitting: *Deleted

## 2021-10-17 DIAGNOSIS — G4733 Obstructive sleep apnea (adult) (pediatric): Secondary | ICD-10-CM | POA: Diagnosis not present

## 2021-10-17 DIAGNOSIS — K565 Intestinal adhesions [bands], unspecified as to partial versus complete obstruction: Secondary | ICD-10-CM | POA: Diagnosis not present

## 2021-10-17 DIAGNOSIS — I10 Essential (primary) hypertension: Secondary | ICD-10-CM | POA: Diagnosis present

## 2021-10-17 DIAGNOSIS — M109 Gout, unspecified: Secondary | ICD-10-CM | POA: Diagnosis present

## 2021-10-17 DIAGNOSIS — Z79899 Other long term (current) drug therapy: Secondary | ICD-10-CM

## 2021-10-17 DIAGNOSIS — Z8249 Family history of ischemic heart disease and other diseases of the circulatory system: Secondary | ICD-10-CM

## 2021-10-17 DIAGNOSIS — J40 Bronchitis, not specified as acute or chronic: Secondary | ICD-10-CM | POA: Diagnosis not present

## 2021-10-17 DIAGNOSIS — J449 Chronic obstructive pulmonary disease, unspecified: Secondary | ICD-10-CM | POA: Diagnosis not present

## 2021-10-17 DIAGNOSIS — K5939 Other megacolon: Secondary | ICD-10-CM | POA: Diagnosis not present

## 2021-10-17 DIAGNOSIS — Z87891 Personal history of nicotine dependence: Secondary | ICD-10-CM | POA: Diagnosis not present

## 2021-10-17 DIAGNOSIS — Z4682 Encounter for fitting and adjustment of non-vascular catheter: Secondary | ICD-10-CM | POA: Diagnosis not present

## 2021-10-17 DIAGNOSIS — N189 Chronic kidney disease, unspecified: Secondary | ICD-10-CM | POA: Diagnosis not present

## 2021-10-17 DIAGNOSIS — Z862 Personal history of diseases of the blood and blood-forming organs and certain disorders involving the immune mechanism: Secondary | ICD-10-CM

## 2021-10-17 DIAGNOSIS — K5730 Diverticulosis of large intestine without perforation or abscess without bleeding: Secondary | ICD-10-CM | POA: Diagnosis not present

## 2021-10-17 DIAGNOSIS — E1122 Type 2 diabetes mellitus with diabetic chronic kidney disease: Secondary | ICD-10-CM | POA: Diagnosis present

## 2021-10-17 DIAGNOSIS — I2510 Atherosclerotic heart disease of native coronary artery without angina pectoris: Secondary | ICD-10-CM | POA: Diagnosis not present

## 2021-10-17 DIAGNOSIS — I130 Hypertensive heart and chronic kidney disease with heart failure and stage 1 through stage 4 chronic kidney disease, or unspecified chronic kidney disease: Secondary | ICD-10-CM | POA: Diagnosis not present

## 2021-10-17 DIAGNOSIS — F1010 Alcohol abuse, uncomplicated: Secondary | ICD-10-CM | POA: Diagnosis present

## 2021-10-17 DIAGNOSIS — Z86010 Personal history of colonic polyps: Secondary | ICD-10-CM

## 2021-10-17 DIAGNOSIS — K3189 Other diseases of stomach and duodenum: Secondary | ICD-10-CM | POA: Diagnosis not present

## 2021-10-17 DIAGNOSIS — Z7985 Long-term (current) use of injectable non-insulin antidiabetic drugs: Secondary | ICD-10-CM | POA: Diagnosis not present

## 2021-10-17 DIAGNOSIS — Z86711 Personal history of pulmonary embolism: Secondary | ICD-10-CM

## 2021-10-17 DIAGNOSIS — J9811 Atelectasis: Secondary | ICD-10-CM | POA: Diagnosis not present

## 2021-10-17 DIAGNOSIS — Z833 Family history of diabetes mellitus: Secondary | ICD-10-CM

## 2021-10-17 DIAGNOSIS — K56609 Unspecified intestinal obstruction, unspecified as to partial versus complete obstruction: Secondary | ICD-10-CM | POA: Diagnosis not present

## 2021-10-17 DIAGNOSIS — K219 Gastro-esophageal reflux disease without esophagitis: Secondary | ICD-10-CM | POA: Diagnosis present

## 2021-10-17 DIAGNOSIS — N184 Chronic kidney disease, stage 4 (severe): Secondary | ICD-10-CM | POA: Diagnosis not present

## 2021-10-17 DIAGNOSIS — I509 Heart failure, unspecified: Secondary | ICD-10-CM | POA: Diagnosis not present

## 2021-10-17 DIAGNOSIS — E785 Hyperlipidemia, unspecified: Secondary | ICD-10-CM | POA: Diagnosis present

## 2021-10-17 DIAGNOSIS — Z0189 Encounter for other specified special examinations: Secondary | ICD-10-CM

## 2021-10-17 DIAGNOSIS — I700 Atherosclerosis of aorta: Secondary | ICD-10-CM | POA: Diagnosis not present

## 2021-10-17 DIAGNOSIS — I5032 Chronic diastolic (congestive) heart failure: Secondary | ICD-10-CM | POA: Diagnosis present

## 2021-10-17 DIAGNOSIS — D179 Benign lipomatous neoplasm, unspecified: Secondary | ICD-10-CM | POA: Diagnosis not present

## 2021-10-17 DIAGNOSIS — Z8719 Personal history of other diseases of the digestive system: Secondary | ICD-10-CM

## 2021-10-17 DIAGNOSIS — R109 Unspecified abdominal pain: Secondary | ICD-10-CM | POA: Diagnosis not present

## 2021-10-17 DIAGNOSIS — N179 Acute kidney failure, unspecified: Secondary | ICD-10-CM | POA: Diagnosis not present

## 2021-10-17 DIAGNOSIS — K6389 Other specified diseases of intestine: Secondary | ICD-10-CM | POA: Diagnosis not present

## 2021-10-17 DIAGNOSIS — D631 Anemia in chronic kidney disease: Secondary | ICD-10-CM | POA: Diagnosis not present

## 2021-10-17 DIAGNOSIS — Z20822 Contact with and (suspected) exposure to covid-19: Secondary | ICD-10-CM | POA: Diagnosis not present

## 2021-10-17 DIAGNOSIS — E860 Dehydration: Secondary | ICD-10-CM | POA: Diagnosis present

## 2021-10-17 LAB — RESP PANEL BY RT-PCR (FLU A&B, COVID) ARPGX2
Influenza A by PCR: NEGATIVE
Influenza B by PCR: NEGATIVE
SARS Coronavirus 2 by RT PCR: NEGATIVE

## 2021-10-17 LAB — CBC WITH DIFFERENTIAL/PLATELET
Abs Immature Granulocytes: 0.03 10*3/uL (ref 0.00–0.07)
Basophils Absolute: 0 10*3/uL (ref 0.0–0.1)
Basophils Relative: 0 %
Eosinophils Absolute: 0.1 10*3/uL (ref 0.0–0.5)
Eosinophils Relative: 2 %
HCT: 36.1 % — ABNORMAL LOW (ref 39.0–52.0)
Hemoglobin: 11.7 g/dL — ABNORMAL LOW (ref 13.0–17.0)
Immature Granulocytes: 0 %
Lymphocytes Relative: 14 %
Lymphs Abs: 1.1 10*3/uL (ref 0.7–4.0)
MCH: 27 pg (ref 26.0–34.0)
MCHC: 32.4 g/dL (ref 30.0–36.0)
MCV: 83.4 fL (ref 80.0–100.0)
Monocytes Absolute: 0.5 10*3/uL (ref 0.1–1.0)
Monocytes Relative: 6 %
Neutro Abs: 6.4 10*3/uL (ref 1.7–7.7)
Neutrophils Relative %: 78 %
Platelets: 198 10*3/uL (ref 150–400)
RBC: 4.33 MIL/uL (ref 4.22–5.81)
RDW: 14.6 % (ref 11.5–15.5)
WBC: 8.2 10*3/uL (ref 4.0–10.5)
nRBC: 0 % (ref 0.0–0.2)

## 2021-10-17 LAB — COMPREHENSIVE METABOLIC PANEL
ALT: 17 U/L (ref 0–44)
AST: 21 U/L (ref 15–41)
Albumin: 4.1 g/dL (ref 3.5–5.0)
Alkaline Phosphatase: 70 U/L (ref 38–126)
Anion gap: 10 (ref 5–15)
BUN: 28 mg/dL — ABNORMAL HIGH (ref 8–23)
CO2: 32 mmol/L (ref 22–32)
Calcium: 9.1 mg/dL (ref 8.9–10.3)
Chloride: 99 mmol/L (ref 98–111)
Creatinine, Ser: 2.39 mg/dL — ABNORMAL HIGH (ref 0.61–1.24)
GFR, Estimated: 27 mL/min — ABNORMAL LOW (ref >60)
Glucose, Bld: 143 mg/dL — ABNORMAL HIGH (ref 70–99)
Potassium: 3.8 mmol/L (ref 3.5–5.1)
Sodium: 141 mmol/L (ref 135–145)
Total Bilirubin: 0.9 mg/dL (ref 0.3–1.2)
Total Protein: 7.5 g/dL (ref 6.5–8.1)

## 2021-10-17 MED ORDER — ONDANSETRON HCL 4 MG/2ML IJ SOLN
4.0000 mg | Freq: Once | INTRAMUSCULAR | Status: AC
  Administered 2021-10-17: 17:00:00 4 mg via INTRAVENOUS
  Filled 2021-10-17: qty 2

## 2021-10-17 MED ORDER — ALBUTEROL SULFATE (2.5 MG/3ML) 0.083% IN NEBU: 3.0000 mL | INHALATION_SOLUTION | Freq: Four times a day (QID) | RESPIRATORY_TRACT | Status: DC | PRN

## 2021-10-17 MED ORDER — IRBESARTAN 75 MG PO TABS
37.5000 mg | ORAL_TABLET | Freq: Every day | ORAL | Status: DC
  Administered 2021-10-18 – 2021-10-19 (×2): 37.5 mg via ORAL
  Filled 2021-10-17 (×2): qty 1

## 2021-10-17 MED ORDER — IOHEXOL 9 MG/ML PO SOLN
500.0000 mL | ORAL | Status: AC
  Administered 2021-10-17: 19:00:00 500 mL via ORAL

## 2021-10-17 MED ORDER — SODIUM CHLORIDE 0.9 % IV BOLUS
500.0000 mL | Freq: Once | INTRAVENOUS | Status: AC
  Administered 2021-10-17: 17:00:00 500 mL via INTRAVENOUS

## 2021-10-17 MED ORDER — ATORVASTATIN CALCIUM 40 MG PO TABS
40.0000 mg | ORAL_TABLET | Freq: Every day | ORAL | Status: DC
  Administered 2021-10-18 – 2021-10-22 (×5): 40 mg via ORAL
  Filled 2021-10-17 (×5): qty 1

## 2021-10-17 MED ORDER — FENTANYL CITRATE PF 50 MCG/ML IJ SOSY
50.0000 ug | PREFILLED_SYRINGE | Freq: Once | INTRAMUSCULAR | Status: AC
  Administered 2021-10-17: 17:00:00 50 ug via INTRAVENOUS
  Filled 2021-10-17: qty 1

## 2021-10-17 MED ORDER — INSULIN ASPART 100 UNIT/ML IJ SOLN
0.0000 [IU] | Freq: Three times a day (TID) | INTRAMUSCULAR | Status: DC
Start: 2021-10-18 — End: 2021-10-23

## 2021-10-17 MED ORDER — CARVEDILOL 12.5 MG PO TABS
12.5000 mg | ORAL_TABLET | Freq: Two times a day (BID) | ORAL | Status: DC
  Administered 2021-10-17 – 2021-10-23 (×12): 12.5 mg via ORAL
  Filled 2021-10-17 (×12): qty 1

## 2021-10-17 MED ORDER — IPRATROPIUM-ALBUTEROL 0.5-2.5 (3) MG/3ML IN SOLN
3.0000 mL | Freq: Four times a day (QID) | RESPIRATORY_TRACT | Status: DC | PRN
  Administered 2021-10-22 (×2): 3 mL via RESPIRATORY_TRACT
  Filled 2021-10-17 (×2): qty 3

## 2021-10-17 MED ORDER — PANTOPRAZOLE SODIUM 40 MG PO TBEC
40.0000 mg | DELAYED_RELEASE_TABLET | Freq: Every day | ORAL | Status: DC
  Administered 2021-10-18 – 2021-10-19 (×2): 40 mg via ORAL
  Filled 2021-10-17 (×2): qty 1

## 2021-10-17 MED ORDER — MORPHINE SULFATE (PF) 2 MG/ML IV SOLN
2.0000 mg | INTRAVENOUS | Status: DC | PRN
  Administered 2021-10-18 – 2021-10-19 (×5): 2 mg via INTRAVENOUS
  Filled 2021-10-17 (×5): qty 1

## 2021-10-17 MED ORDER — FUROSEMIDE 80 MG PO TABS
80.0000 mg | ORAL_TABLET | Freq: Every day | ORAL | Status: DC
  Administered 2021-10-18 – 2021-10-19 (×2): 80 mg via ORAL
  Filled 2021-10-17 (×2): qty 1

## 2021-10-17 MED ORDER — IOHEXOL 9 MG/ML PO SOLN
ORAL | Status: AC
  Administered 2021-10-17: 18:00:00 500 mL via ORAL
  Filled 2021-10-17: qty 1000

## 2021-10-17 MED ORDER — AMLODIPINE BESYLATE 5 MG PO TABS
10.0000 mg | ORAL_TABLET | Freq: Every day | ORAL | Status: DC
  Administered 2021-10-18 – 2021-10-23 (×6): 10 mg via ORAL
  Filled 2021-10-17 (×6): qty 2

## 2021-10-17 MED ORDER — LORAZEPAM 2 MG/ML IJ SOLN
1.0000 mg | Freq: Once | INTRAMUSCULAR | Status: AC
  Administered 2021-10-17: 19:00:00 1 mg via INTRAVENOUS
  Filled 2021-10-17: qty 1

## 2021-10-17 MED ORDER — FENTANYL CITRATE PF 50 MCG/ML IJ SOSY
100.0000 ug | PREFILLED_SYRINGE | Freq: Once | INTRAMUSCULAR | Status: AC
  Administered 2021-10-17: 19:00:00 100 ug via INTRAVENOUS
  Filled 2021-10-17: qty 2

## 2021-10-17 MED ORDER — TRAMADOL HCL 50 MG PO TABS
50.0000 mg | ORAL_TABLET | Freq: Four times a day (QID) | ORAL | Status: DC | PRN
  Administered 2021-10-21: 14:00:00 50 mg via ORAL
  Filled 2021-10-17: qty 1

## 2021-10-17 MED ORDER — ONDANSETRON 4 MG PO TBDP
4.0000 mg | ORAL_TABLET | Freq: Four times a day (QID) | ORAL | Status: DC | PRN
  Administered 2021-10-22: 17:00:00 4 mg via ORAL
  Filled 2021-10-17: qty 1

## 2021-10-17 MED ORDER — ENOXAPARIN SODIUM 40 MG/0.4ML IJ SOSY
40.0000 mg | PREFILLED_SYRINGE | INTRAMUSCULAR | Status: DC
  Administered 2021-10-18: 09:00:00 40 mg via SUBCUTANEOUS
  Filled 2021-10-17 (×2): qty 0.4

## 2021-10-17 MED ORDER — ALLOPURINOL 100 MG PO TABS
100.0000 mg | ORAL_TABLET | Freq: Every day | ORAL | Status: DC
  Administered 2021-10-18 – 2021-10-23 (×6): 100 mg via ORAL
  Filled 2021-10-17 (×6): qty 1

## 2021-10-17 MED ORDER — DOCUSATE SODIUM 100 MG PO CAPS: 100.0000 mg | ORAL_CAPSULE | Freq: Two times a day (BID) | ORAL | Status: DC | PRN

## 2021-10-17 MED ORDER — ONDANSETRON HCL 4 MG/2ML IJ SOLN
4.0000 mg | Freq: Four times a day (QID) | INTRAMUSCULAR | Status: DC | PRN
  Administered 2021-10-18: 4 mg via INTRAVENOUS
  Filled 2021-10-17 (×2): qty 2

## 2021-10-17 NOTE — ED Notes (Signed)
ED Provider at bedside. 

## 2021-10-17 NOTE — ED Triage Notes (Signed)
Severe abdominal pain onset this am, took medication for constipation early am and vomited med around 1300. Abdomen very tender

## 2021-10-17 NOTE — ED Provider Notes (Signed)
[facility] EMERGENCY DEPARTMENT Provider Note   CSN: [id number] Arrival date & time: 10/17/21  1521     History Chief Complaint  Patient presents with   Abdominal Pain    Andrew Adams is a 75 y.o. male.  HPI 3:44 PM-patient presents for abdominal pain and constipation.  An episode of vomiting earlier today.  He is chronically ill with history of CAD Pap for sleep apnea, hypertension, chronic kidney disease, history of acute respiratory failure with hypoxia, chronic alcohol abuse, congestive heart failure and COPD.  He was recently started on valsartan for hypertension.  He states that this morning he felt funny and had a small bowel movement.  He then took a laxative to try to move his bowels.  After that he started having abdominal pain, which gradually worsened.  He then vomited multiple times.  Emesis was yellow in color without blood.  He denies fever, shortness of breath, cough, weakness or dizziness.  There are no other known active modifying factors.    Past Medical History:  Diagnosis Date   Anemia    Borderline diabetic    Chronic kidney disease    Duodenal adenoma    2021   GERD (gastroesophageal reflux disease)    Gout    Gouty arthritis    H/O small bowel obstruction    History of back surgery    Hx of adenomatous colonic polyps    2021   Hyperlipidemia    Hypertension    PE (pulmonary embolism)    Sleep apnea     Patient Active Problem List   Diagnosis Date Noted   Moderate obstructive sleep apnea 06/24/2020   COPD (chronic obstructive pulmonary disease) ([place]) 06/24/2020   Swelling of right wrist 06/24/2020   Anemia 05/07/2020   Chronic respiratory failure with hypoxia ([place]) 04/03/2020   Acute congestive heart failure (HCC)    Acute respiratory failure with hypoxia ([place]) 02/10/2020   CAP (community acquired pneumonia) 02/10/2020   Generalized weakness 02/10/2020   DOE (dyspnea on exertion) 02/10/2020   Chronic alcohol abuse 02/10/2020   Varicose  veins of bilateral lower extremities with other complications 32/95/1884   Lumbar disc herniation 01/27/2014   CKD (chronic kidney disease) stage 2, GFR 60-89 ml/min 01/09/2014   Sciatica 01/09/2014   Acute back pain 01/09/2014   Essential hypertension 04/12/2013   Hyperlipidemia 04/12/2013   GERD (gastroesophageal reflux disease) 04/12/2013   Palpitation 02/06/2013    Past Surgical History:  Procedure Laterality Date   ABDOMINAL SURGERY  2103   Smal bowel obstruction    ABDOMINAL SURGERY  1981   Perforated large intestine   back     Lower back   BIOPSY  08/28/2020   Procedure: BIOPSY;  Surgeon: Harvel Quale, MD;  Location: [facility];  Service: Gastroenterology;;   CATARACT EXTRACTION W/PHACO Right 01/06/2020   Procedure: CATARACT EXTRACTION PHACO AND INTRAOCULAR LENS PLACEMENT (IOC) (CDE: 6.48);  Surgeon: [name], MD;  Location: [facility];  Service: Ophthalmology;  Laterality: Right;   CATARACT EXTRACTION W/PHACO Left 01/20/2020   Procedure: CATARACT EXTRACTION PHACO AND INTRAOCULAR LENS PLACEMENT (IOC);  Surgeon: [name], MD;  Location: [facility];  Service: Ophthalmology;  Laterality: Left;  CDE: 5.86   COLONOSCOPY WITH PROPOFOL N/A 08/28/2020   Procedure: COLONOSCOPY WITH PROPOFOL;  Surgeon: Harvel Quale, MD;  Location: [facility];  Service: Gastroenterology;  Laterality: N/A;  230   ESOPHAGOGASTRODUODENOSCOPY (EGD) WITH PROPOFOL N/A 08/28/2020   Procedure: ESOPHAGOGASTRODUODENOSCOPY (EGD) WITH PROPOFOL;  Surgeon: Montez Morita, Quillian Quince, MD;  Location: [facility];  Service: Gastroenterology;  Laterality: N/A;   ESOPHAGOGASTRODUODENOSCOPY (EGD) WITH PROPOFOL N/A 12/14/2020   Procedure: ESOPHAGOGASTRODUODENOSCOPY (EGD) WITH PROPOFOL;  Surgeon: Rush Landmark Telford Nab., MD;  Location: [facility];  Service: Gastroenterology;  Laterality: N/A;   GIVENS CAPSULE STUDY N/A 09/10/2020   Procedure: GIVENS CAPSULE STUDY;  Surgeon: Harvel Quale, MD;  [facility];  Service: Gastroenterology;  Laterality: N/A;  [place]  12/14/2020   Procedure: HEMOSTASIS CLIP PLACEMENT;  Surgeon: Irving Copas., MD;  Location: [facility];  Service: Gastroenterology;;   HEMOSTASIS CONTROL  12/14/2020   Procedure: HEMOSTASIS CONTROL;  Surgeon: Irving Copas., MD;  Location: [facility];  Service: Gastroenterology;;   HERNIA REPAIR     incisional   POLYPECTOMY  08/28/2020   Procedure: POLYPECTOMY;  Surgeon: Harvel Quale, MD;  [facility];  Service: Gastroenterology;;   POLYPECTOMY  12/14/2020   Procedure: POLYPECTOMY;  Surgeon: Irving Copas., MD;  Location: [facility];  Service: Gastroenterology;;   Clide Deutscher  12/14/2020   Procedure: Clide Deutscher;  Surgeon: Mansouraty, Telford Nab., MD;  Location: [facility];  Service: Gastroenterology;;   SUBMUCOSAL TATTOO INJECTION  12/14/2020   Procedure: SUBMUCOSAL TATTOO INJECTION;  Surgeon: Irving Copas., MD;  Location: [facility];  Service: Gastroenterology;;       Family History  Problem Relation Age of Onset   Hypertension Sister    Diabetes Sister    Diabetes Sister    Hypertension Mother    Diabetes Mother    Hypertension Father    Ulcers Father    Colon cancer Neg Hx    Esophageal cancer Neg Hx    Stomach cancer Neg Hx    Inflammatory bowel disease Neg Hx    Liver disease Neg Hx    Pancreatic cancer Neg Hx    Rectal cancer Neg Hx     Social History   Tobacco Use   Smoking status: Former    Packs/day: 1.00    Years: 38.00    Pack years: 38.00    Types: Cigarettes    Quit date: 02/07/1995    Years since quitting: 26.7   Smokeless tobacco: Never  Vaping Use   Vaping Use: Never used  Substance Use Topics   Alcohol use: Yes    Comment: occasional    Drug use: No    Home Medications Prior to Admission medications   Medication Sig Start Date End Date Taking? Authorizing  Provider  allopurinol (ZYLOPRIM) 100 MG tablet Take 1 tablet (100 mg total) by mouth daily. 02/24/14   Hassell Done, Mary-Margaret, FNP  amLODipine (NORVASC) 10 MG tablet Take 10 mg by mouth daily. 12/20/19   [provider]  atorvastatin (LIPITOR) 40 MG tablet Take 1 tablet (40 mg total) by mouth daily. 02/24/14   Hassell Done, Mary-Margaret, FNP  carvedilol (COREG) 12.5 MG tablet Take 12.5 mg by mouth 2 (two) times daily with a meal.    [provider]  epoetin alfa-epbx (RETACRIT) 3000 UNIT/ML injection Inject 3,000 Units into the vein every 14 (fourteen) days.     [provider]  FEROSUL 325 (65 Fe) MG tablet TAKE ONE TABLET BY MOUTH EVERY MORNING WITH BREAKFAST 12/25/20   Montez Morita, Quillian Quince, MD  furosemide (LASIX) 40 MG tablet Take 80 mg by mouth daily.    [provider]  ipratropium-albuterol (DUONEB) 0.5-2.5 (3) MG/3ML SOLN Take 3 mLs by nebulization every 6 (six) hours as  needed. 01/26/21   Chesley Mires, MD  losartan (COZAAR) 50 MG tablet TAKE ONE TABLET BY MOUTH TWICE DAILY 06/29/21   Tanda Rockers, MD  metoprolol (LOPRESSOR) 50 MG tablet Take 1 tablet (50 mg total) by mouth 2 (two) times daily. 02/24/14   Hassell Done Mary-Margaret, FNP  Multiple Vitamin (MULTIVITAMIN WITH MINERALS) TABS tablet Take 1 tablet by mouth daily. 02/16/20   Johnson, Clanford L, MD  OXYGEN Inhale 2 L into the lungs continuous.    [provider]  pantoprazole (PROTONIX) 40 MG tablet Take 1 tablet (40 mg total) by mouth 2 (two) times daily before a meal. 12/14/20   Mansouraty, Telford Nab., MD  TRULICITY 1.74 BS/4.9QP SOPN Inject 0.75 mg into the skin every 7 (seven) days.     [provider]    Allergies    Patient has no known allergies.  Review of Systems   Review of Systems  All other systems reviewed and are negative.  Physical Exam Updated Vital Signs BP (!) 145/94    Pulse 77    Temp 98.4 F (36.9 C) (Oral)    Resp (!) 23    SpO2 97%   Physical Exam Vitals  and nursing note reviewed.  Constitutional:      General: He is not in acute distress.    Appearance: He is well-developed. He is not ill-appearing, toxic-appearing or diaphoretic.  HENT:     Head: Normocephalic and atraumatic.     Right Ear: External ear normal.     Left Ear: External ear normal.  Eyes:     Conjunctiva/sclera: Conjunctivae normal.     Pupils: Pupils are equal, round, and reactive to light.  Neck:     Trachea: Phonation normal.  Cardiovascular:     Rate and Rhythm: Normal rate and regular rhythm.     Heart sounds: Normal heart sounds.  Pulmonary:     Effort: Pulmonary effort is normal. No respiratory distress.     Breath sounds: Normal breath sounds. No stridor.  Abdominal:     General: There is distension.     Palpations: There is no mass.     Tenderness: There is abdominal tenderness. There is guarding.     Comments: Hernia mass, nonreducible, mid abdomen, just to the right of the midline.  Quiet bowel sounds.  Musculoskeletal:        General: Normal range of motion.     Cervical back: Normal range of motion and neck supple.  Skin:    General: Skin is warm and dry.  Neurological:     Mental Status: He is alert and oriented to person, place, and time.     Cranial Nerves: No cranial nerve deficit.     Sensory: No sensory deficit.     Motor: No abnormal muscle tone.     Coordination: Coordination normal.  Psychiatric:        Mood and Affect: Mood normal.        Behavior: Behavior normal.        Thought Content: Thought content normal.        Judgment: Judgment normal.    ED Results / Procedures / Treatments   Labs (all labs ordered are listed, but only abnormal results are displayed) Labs Reviewed  COMPREHENSIVE METABOLIC PANEL - Abnormal; Notable for the following components:      Result Value   Glucose, Bld 143 (*)    BUN 28 (*)    Creatinine, Ser 2.39 (*)    GFR, Estimated  27 (*)    All other components within normal limits  CBC WITH  DIFFERENTIAL/PLATELET - Abnormal; Notable for the following components:   Hemoglobin 11.7 (*)    HCT 36.1 (*)    All other components within normal limits  RESP PANEL BY RT-PCR (FLU A&B, COVID) [id number]    EKG None  Radiology CT Abdomen Pelvis Wo Contrast  Result Date: 10/17/2021 CLINICAL DATA:  Abdominal pain and possible bowel obstruction EXAM: CT ABDOMEN AND PELVIS WITHOUT CONTRAST TECHNIQUE: Multidetector CT imaging of the abdomen and pelvis was performed following the standard protocol without IV contrast. COMPARISON:  10/20/2020, 02/19/2021 FINDINGS: Lower chest: Lung bases demonstrate mild scarring bilaterally. Stable nodular changes are noted in the bases bilaterally stable from prior CT. The dominant nodule in the left lower lobe posteriorly measures approximately 16 mm and appears stable from the prior CT examination from 02/19/2021. Hepatobiliary: No focal liver abnormality is seen. No gallstones, gallbladder wall thickening, or biliary dilatation. Pancreas: Unremarkable. No pancreatic ductal dilatation or surrounding inflammatory changes. Spleen: Normal in size without focal abnormality. Adrenals/Urinary Tract: Adrenal glands are within normal limits bilaterally. Kidneys demonstrate scattered hypodensities likely representing cysts which appears stable in appearance from the prior exam. No renal calculi or obstructive changes are seen. Bladder is partially distended. Stomach/Bowel: Diverticular change of the colon is noted without definitive diverticulitis. The appendix is not well visualized although no inflammatory changes to suggest appendicitis are seen. Small bowel dilatation is noted throughout with evidence of fecalization of bowel contents in the distal small bowel. Abrupt transition point is noted in the right lower abdomen best seen on axial image number 67 of series 2. This is likely related to adhesions from prior surgery. The stomach is well distended with contrast material.  Vascular/Lymphatic: Aortic atherosclerosis. No enlarged abdominal or pelvic lymph nodes. Reproductive: Prostate is unremarkable. Other: No abdominal wall hernia or abnormality. No abdominopelvic ascites. 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 Degenerative changes of lumbar spine are noted. IMPRESSION: Changes consistent with high-grade small bowel obstruction with a transition zone in the right lower quadrant likely related to postoperative adhesions. Diverticulosis without diverticulitis. Nodular changes in the lung bases bilaterally. The largest of these lies in the left lower lobe and is stable from the prior exam of December of 2021. Follow-up exam in 1 year would be helpful to assess for stability Electronically Signed   By: Inez Catalina M.D.   On: 10/17/2021 18:40    Procedures .Critical Care Performed by: Daleen Bo, MD Authorized by: Daleen Bo, MD   Critical care provider statement:    Critical care time (minutes):  35   Critical care start time:  10/17/2021 3:50 PM   Critical care end time:  10/17/2021 7:20 PM   Critical care time was exclusive of:  Separately billable procedures and treating other patients   Critical care was necessary to treat or prevent imminent or life-threatening deterioration of the following conditions: Small bowel obstruction.   Critical care was time spent personally by me on the following activities:  Blood draw for specimens, development of treatment plan with patient or surrogate, discussions with consultants, evaluation of patient's response to treatment, examination of patient, ordering and performing treatments and interventions, ordering and review of laboratory studies, ordering and review of radiographic studies, pulse oximetry, re-evaluation of patient's condition and review of old charts   Medications Ordered in ED Medications  iohexol (OMNIPAQUE) 9 MG/ML oral solution 500 mL (500 mLs  Oral  Contrast Given 10/17/21 1811)  sodium chloride 0.9 % bolus 500 mL (0 mLs Intravenous Stopped 10/17/21 1658)  ondansetron (ZOFRAN) injection 4 mg (4 mg Intravenous Given 10/17/21 1634)  fentaNYL (SUBLIMAZE) injection 50 mcg (50 mcg Intravenous Given 10/17/21 1634)  fentaNYL (SUBLIMAZE) injection 100 mcg (100 mcg Intravenous Given 10/17/21 1902)  LORazepam (ATIVAN) injection 1 mg (1 mg Intravenous Given 10/17/21 1902)    ED Course  I have reviewed the triage vital signs and the nursing notes.  Pertinent labs & imaging results that were available during my care of the patient were reviewed by me and considered in my medical decision making (see chart for details).  Clinical Course as of 10/17/21 [place] Oct 17, 2021  1857 Case discussed with general surgery who will come here and admit the patient. [EW]    Clinical Course User Index [EW] Daleen Bo, MD   MDM Rules/Calculators/A&P                          Patient Vitals for the past 24 hrs:  BP Temp Temp src Pulse Resp SpO2  10/17/21 1815 (!) 145/94 -- -- 77 (!) 23 97 %  10/17/21 1630 -- -- -- 77 18 94 %  10/17/21 1600 (!) 149/98 -- -- 80 (!) 22 94 %  10/17/21 1541 (!) 143/77 98.4 F (36.9 C) Oral 77 16 98 %    6:54 PM Reevaluation with update and discussion. After initial assessment and treatment, an updated evaluation reveals he continues to be uncomfortable despite receiving narcotic treatment.  He is agreeable to proceeding with NG placement after additional medication for pain and to help him relax.  At this time he reports that his initial surgery was about 50 years ago for a possible appendix infection.  He was ultimately diagnosed with a perforated intestine, on a subsequent laparotomy.  Since that time he has had at least 1 additional open procedure for bowel obstruction.  He also apparently had a hernia repaired at one time.  The surgeries were not done at this facility.  Wife is in the room at this time and she is updated  on the findings.  Will place NG tube.  Will contact surgery. Daleen Bo   Medical Decision Making:  This patient is presenting for evaluation of vomiting and abdominal pain, which does require a range of treatment options, and is a complaint that involves a high risk of morbidity and mortality. The differential diagnoses include bowel obstruction, gastritis, constipation. I decided to review old records, and in summary elderly male with a history of chronic kidney disease and prior abdominal surgeries, presenting with vomiting and abdominal pain.  He has a palpable hernia which is nonreducible.  I did not require additional historical information from anyone.  Clinical Laboratory Tests Ordered, included CBC, Metabolic panel, and viral panel . Review indicates normal except glucose high, BUN high, creatinine high, hemoglobin low. Radiologic Tests Ordered, included CT abdomen pelvis.  I independently Visualized: Radiographic images, which show small bowel obstruction, secondary to adhesions, high-grade  Cardiac Monitor Tracing which shows normal sinus rhythm     Critical Interventions-clinical evaluation, laboratory testing, radiography, medication treatment, IV fluids, observation and reassessment  After These Interventions, the Patient was reevaluated and was found with high-grade small bowel obstruction requiring placement of NG tube and possible operative management.  Ongoing stable chronic kidney disease.  No evidence for acute infection.  Screening viral panel negative.  CRITICAL  CARE-yes Performed by: Daleen Bo  Nursing Notes Reviewed/ Care Coordinated Applicable Imaging Reviewed Interpretation of Laboratory Data incorporated into ED treatment   General surgery to admit patient      Final Clinical Impression(s) / ED Diagnoses Final diagnoses:  Small bowel obstruction due to adhesions [facility])    Rx / DC Orders ED Discharge Orders     None        Daleen Bo,  MD 10/17/21 1921

## 2021-10-17 NOTE — H&P (Addendum)
Subjective:   CC: SBO  HPI:  Andrew Adams is a 75 y.o. male who was consulted by Eulis Foster for issue above.  Symptoms were first noted 1 day ago. Pain is sharp, worsening, abdominal.  Associated with N/V, exacerbated by nothing specific. Last BM this am.  Worsening distention.  Not on any anticoagulation, last surgeries in the 90s.  Has had previous episodes of SBO that resolved without intervention.  Pt already had NG placed by RN staff.   Past Medical History:  has a past medical history of Anemia, Borderline diabetic, Chronic kidney disease, Duodenal adenoma, GERD (gastroesophageal reflux disease), Gout, Gouty arthritis, H/O small bowel obstruction, History of back surgery, adenomatous colonic polyps, Hyperlipidemia, Hypertension, PE (pulmonary embolism), and Sleep apnea.  Past Surgical History:  Past Surgical History:  Procedure Laterality Date   ABDOMINAL SURGERY  2103   Smal bowel obstruction    ABDOMINAL SURGERY  1981   Perforated large intestine   back     Lower back   BIOPSY  08/28/2020   Procedure: BIOPSY;  Surgeon: Harvel Quale, MD;  [facility]: [facility];  Service: Gastroenterology;;   CATARACT EXTRACTION W/PHACO Right 01/06/2020   Procedure: CATARACT EXTRACTION PHACO AND INTRAOCULAR LENS PLACEMENT (IOC) (CDE: 6.48);  Surgeon: [name], MD;  Location: [facility];  Service: Ophthalmology;  Laterality: Right;   CATARACT EXTRACTION W/PHACO Left 01/20/2020   Procedure: CATARACT EXTRACTION PHACO AND INTRAOCULAR LENS PLACEMENT (IOC);  Surgeon: [name], MD;  Location: [facility];  Service: Ophthalmology;  Laterality: Left;  CDE: 5.86   COLONOSCOPY WITH PROPOFOL N/A 08/28/2020   Procedure: COLONOSCOPY WITH PROPOFOL;  Surgeon: Harvel Quale, MD;  [facility]: [facility];  Service: Gastroenterology;  Laterality: N/A;  230   ESOPHAGOGASTRODUODENOSCOPY (EGD) WITH PROPOFOL N/A 08/28/2020   Procedure: ESOPHAGOGASTRODUODENOSCOPY (EGD) WITH PROPOFOL;  Surgeon:  Harvel Quale, MD;  [facility]: [facility];  Service: Gastroenterology;  Laterality: N/A;   ESOPHAGOGASTRODUODENOSCOPY (EGD) WITH PROPOFOL N/A 12/14/2020   Procedure: ESOPHAGOGASTRODUODENOSCOPY (EGD) WITH PROPOFOL;  Surgeon: Rush Landmark Telford Nab., MD;  Location: [facility];  Service: Gastroenterology;  Laterality: N/A;   GIVENS CAPSULE STUDY N/A 09/10/2020   Procedure: GIVENS CAPSULE STUDY;  Surgeon: Harvel Quale, MD;  [facility]: [facility];  Service: Gastroenterology;  Laterality: N/A;  [place]  12/14/2020   Procedure: HEMOSTASIS CLIP PLACEMENT;  Surgeon: Irving Copas., MD;  Location: [facility];  Service: Gastroenterology;;   HEMOSTASIS CONTROL  12/14/2020   Procedure: HEMOSTASIS CONTROL;  Surgeon: Irving Copas., MD;  Location: [facility];  Service: Gastroenterology;;   HERNIA REPAIR     incisional   POLYPECTOMY  08/28/2020   Procedure: POLYPECTOMY;  Surgeon: Harvel Quale, MD;  [facility];  Service: Gastroenterology;;   POLYPECTOMY  12/14/2020   Procedure: POLYPECTOMY;  Surgeon: Irving Copas., MD;  Location: [facility];  Service: Gastroenterology;;   Clide Deutscher  12/14/2020   Procedure: Clide Deutscher;  Surgeon: Mansouraty, Telford Nab., MD;  Location: [facility];  Service: Gastroenterology;;   SUBMUCOSAL TATTOO INJECTION  12/14/2020   Procedure: SUBMUCOSAL TATTOO INJECTION;  Surgeon: Irving Copas., MD;  Location: [facility];  Service: Gastroenterology;;    Family History: family history includes Diabetes in his mother, sister, and sister; Hypertension in his father, mother, and sister; Ulcers in his father.  Social History:  reports that he quit smoking about 26 years ago. His smoking use included cigarettes. He has a 38.00 pack-year smoking history. He has never  used smokeless tobacco. He reports current alcohol use. He reports that he does not use drugs.  Current  Medications:  Prior to Admission medications   Medication Sig Start Date End Date Taking? Authorizing Provider  albuterol (VENTOLIN HFA) 108 (90 Base) MCG/ACT inhaler Inhale into the lungs. 11/24/17  Yes [provider]  allopurinol (ZYLOPRIM) 100 MG tablet Take 1 tablet (100 mg total) by mouth daily. 02/24/14  Yes Martin, Mary-Margaret, FNP  amLODipine (NORVASC) 10 MG tablet Take 10 mg by mouth daily. 12/20/19  Yes [provider]  atorvastatin (LIPITOR) 40 MG tablet Take 1 tablet (40 mg total) by mouth daily. 02/24/14  Yes Martin, Mary-Margaret, FNP  carvedilol (COREG) 12.5 MG tablet Take 12.5 mg by mouth 2 (two) times daily with a meal.   Yes [provider]  epoetin alfa-epbx (RETACRIT) 3000 UNIT/ML injection Inject 3,000 Units into the vein every 14 (fourteen) days.    Yes [provider]  FEROSUL 325 (65 Fe) MG tablet TAKE ONE TABLET BY MOUTH EVERY MORNING WITH BREAKFAST 12/25/20  Yes Montez Morita, Quillian Quince, MD  furosemide (LASIX) 40 MG tablet Take 80 mg by mouth daily.   Yes [provider]  ipratropium-albuterol (DUONEB) 0.5-2.5 (3) MG/3ML SOLN Take 3 mLs by nebulization every 6 (six) hours as needed. 01/26/21  Yes Chesley Mires, MD  Multiple Vitamin (MULTIVITAMIN WITH MINERALS) TABS tablet Take 1 tablet by mouth daily. 02/16/20  Yes Johnson, Clanford L, MD  pantoprazole (PROTONIX) 40 MG tablet Take 1 tablet (40 mg total) by mouth 2 (two) times daily before a meal. Patient taking differently: Take 40 mg by mouth daily. 12/14/20  Yes Mansouraty, Telford Nab., MD  TRULICITY 9.41 DE/0.8XK SOPN Inject 0.75 mg into the skin every 7 (seven) days.    Yes [provider]  valsartan (DIOVAN) 40 MG tablet Take 40 mg by mouth daily. 10/11/21  Yes [provider]  amLODipine (NORVASC) 10 MG tablet Take by mouth. Patient not taking: Reported on 10/17/2021 12/24/20 12/24/21  [provider]  losartan (COZAAR) 50 MG tablet TAKE ONE TABLET BY  MOUTH TWICE DAILY Patient not taking: Reported on 10/17/2021 06/29/21   Tanda Rockers, MD  metoprolol (LOPRESSOR) 50 MG tablet Take 1 tablet (50 mg total) by mouth 2 (two) times daily. Patient not taking: Reported on 10/17/2021 02/24/14   Chevis Pretty, FNP  OXYGEN Inhale 2 L into the lungs continuous. Patient not taking: Reported on 10/17/2021    [provider]    Allergies:  Allergies as of 10/17/2021   (No Known Allergies)    ROS:  General: Denies weight loss, weight gain, fatigue, fevers, chills, and night sweats. Eyes: Denies blurry vision, double vision, eye pain, itchy eyes, and tearing. Ears: Denies hearing loss, earache, and ringing in ears. Nose: Denies sinus pain, congestion, infections, runny nose, and nosebleeds. Mouth/throat: Denies hoarseness, sore throat, bleeding gums, and difficulty swallowing. Heart: Denies chest pain, palpitations, racing heart, irregular heartbeat, leg pain or swelling, and decreased activity tolerance. Respiratory: Denies breathing difficulty, shortness of breath, wheezing, cough, and sputum. GI: Denies change in appetite, heartburn, constipation, diarrhea, and blood in stool. GU: Denies difficulty urinating, pain with urinating, urgency, frequency, blood in urine. Musculoskeletal: Denies joint stiffness, pain, swelling, muscle weakness. Skin: Denies rash, itching, mass, tumors, sores, and boils Neurologic: Denies headache, fainting, dizziness, seizures, numbness, and tingling. Psychiatric: Denies depression, anxiety, difficulty sleeping, and memory loss. Endocrine: Denies heat or cold intolerance, and increased thirst or urination. Blood/lymph: Denies easy bruising, easy  bruising, and swollen glands     Objective:     BP 102/74    Pulse 79    Temp 98.4 F (36.9 C) (Oral)    Resp (!) 23    SpO2 92%   Constitutional :  alert, cooperative, appears stated age, and no distress  Lymphatics/Throat:  no asymmetry, masses, or  scars  Respiratory:  clear to auscultation bilaterally  Cardiovascular:  regular rate and rhythm  Gastrointestinal: Soft, but distended, diffuse, with TTP all quadrants, no guarding .   Musculoskeletal: Steady movement  Skin: Cool and moist,   Psychiatric: Normal affect, non-agitated, not confused       LABS:  CMP Latest Ref Rng & Units 10/17/2021 09/28/2021 09/28/2021  Glucose 70 - 99 mg/dL 143(H) 131(H) -  BUN 8 - 23 mg/dL 28(H) 26(H) -  Creatinine 0.61 - 1.24 mg/dL 2.39(H) 2.27(H) -  Sodium 135 - 145 mmol/L 141 136 -  Potassium 3.5 - 5.1 mmol/L 3.8 3.7 -  Chloride 98 - 111 mmol/L 99 102 -  CO2 22 - 32 mmol/L 32 26 -  Calcium 8.9 - 10.3 mg/dL 9.1 8.5(L) 8.5(L)  Total Protein 6.5 - 8.1 g/dL 7.5 - -  Total Bilirubin 0.3 - 1.2 mg/dL 0.9 - -  Alkaline Phos 38 - 126 U/L 70 - -  AST 15 - 41 U/L 21 - -  ALT 0 - 44 U/L 17 - -   CBC Latest Ref Rng & Units 10/17/2021 10/12/2021 09/28/2021  WBC 4.0 - 10.5 K/uL 8.2 - -  Hemoglobin 13.0 - 17.0 g/dL 11.7(L) 11.0(L) 10.5(L)  Hematocrit 39.0 - 52.0 % 36.1(L) - -  Platelets 150 - 400 K/uL 198 - -    RADS: CLINICAL DATA:  Abdominal pain and possible bowel obstruction   EXAM: CT ABDOMEN AND PELVIS WITHOUT CONTRAST   TECHNIQUE: Multidetector CT imaging of the abdomen and pelvis was performed following the standard protocol without IV contrast.   COMPARISON:  10/20/2020, 02/19/2021   FINDINGS: Lower chest: Lung bases demonstrate mild scarring bilaterally. Stable nodular changes are noted in the bases bilaterally stable from prior CT. The dominant nodule in the left lower lobe posteriorly measures approximately 16 mm and appears stable from the prior CT examination from 02/19/2021.   Hepatobiliary: No focal liver abnormality is seen. No gallstones, gallbladder wall thickening, or biliary dilatation.   Pancreas: Unremarkable. No pancreatic ductal dilatation or surrounding inflammatory changes.   Spleen: Normal in size without  focal abnormality.   Adrenals/Urinary Tract: Adrenal glands are within normal limits bilaterally. Kidneys demonstrate scattered hypodensities likely representing cysts which appears stable in appearance from the prior exam. No renal calculi or obstructive changes are seen. Bladder is partially distended.   Stomach/Bowel: Diverticular change of the colon is noted without definitive diverticulitis. The appendix is not well visualized although no inflammatory changes to suggest appendicitis are seen. Small bowel dilatation is noted throughout with evidence of fecalization of bowel contents in the distal small bowel. Abrupt transition point is noted in the right lower abdomen best seen on axial image number 67 of series 2. This is likely related to adhesions from prior surgery. The stomach is well distended with contrast material.   Vascular/Lymphatic: Aortic atherosclerosis. No enlarged abdominal or pelvic lymph nodes.   Reproductive: Prostate is unremarkable.   Other: No abdominal wall hernia or abnormality. No abdominopelvic ascites.   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 Degenerative changes of lumbar spine are noted.  IMPRESSION: Changes consistent with high-grade small bowel obstruction with a transition zone in the right lower quadrant likely related to postoperative adhesions.   Diverticulosis without diverticulitis.   Nodular changes in the lung bases bilaterally. The largest of these lies in the left lower lobe and is stable from the prior exam of December of 2021. Follow-up exam in 1 year would be helpful to assess for stability     Electronically Signed   By: Inez Catalina M.D.   On: 10/17/2021 18:40 Assessment:   SBO CKD, DM, COPD, HTN  Plan:     Discussed pathophisiology and treatment options including NG tube decompression. The patient verbalized understanding and all questions were answered to  the patient's satisfaction.  Upon initial examination, NG noted to not be flushable or push air through.  CXR reviewed and noted NG coiled. remanipulated afterwards and noted to have moderate output and easily able to push air through this time.  Placed on low intermittent.  Continue to monitor with serial abdominal exams and no immediate surgical intervention needed for now.    Continue home meds for chronic issues.  DM noted in nephrology notes but no home DM meds reported.  Will place on SSI.

## 2021-10-18 LAB — GLUCOSE, CAPILLARY
Glucose-Capillary: 100 mg/dL — ABNORMAL HIGH (ref 70–99)
Glucose-Capillary: 104 mg/dL — ABNORMAL HIGH (ref 70–99)
Glucose-Capillary: 108 mg/dL — ABNORMAL HIGH (ref 70–99)
Glucose-Capillary: 84 mg/dL (ref 70–99)
Glucose-Capillary: 94 mg/dL (ref 70–99)

## 2021-10-18 LAB — BASIC METABOLIC PANEL
Anion gap: 12 (ref 5–15)
BUN: 39 mg/dL — ABNORMAL HIGH (ref 8–23)
CO2: 29 mmol/L (ref 22–32)
Calcium: 8.4 mg/dL — ABNORMAL LOW (ref 8.9–10.3)
Chloride: 100 mmol/L (ref 98–111)
Creatinine, Ser: 3.16 mg/dL — ABNORMAL HIGH (ref 0.61–1.24)
GFR, Estimated: 19 mL/min — ABNORMAL LOW (ref >60)
Glucose, Bld: 114 mg/dL — ABNORMAL HIGH (ref 70–99)
Potassium: 4.2 mmol/L (ref 3.5–5.1)
Sodium: 141 mmol/L (ref 135–145)

## 2021-10-18 LAB — CBC
HCT: 32.1 % — ABNORMAL LOW (ref 39.0–52.0)
Hemoglobin: 10.5 g/dL — ABNORMAL LOW (ref 13.0–17.0)
MCH: 27.6 pg (ref 26.0–34.0)
MCHC: 32.7 g/dL (ref 30.0–36.0)
MCV: 84.3 fL (ref 80.0–100.0)
Platelets: 188 10*3/uL (ref 150–400)
RBC: 3.81 MIL/uL — ABNORMAL LOW (ref 4.22–5.81)
RDW: 14.6 % (ref 11.5–15.5)
WBC: 7.4 10*3/uL (ref 4.0–10.5)
nRBC: 0 % (ref 0.0–0.2)

## 2021-10-18 LAB — HEMOGLOBIN A1C
Hgb A1c MFr Bld: 6.3 % — ABNORMAL HIGH (ref 4.8–5.6)
Mean Plasma Glucose: 134.11 mg/dL

## 2021-10-18 LAB — MAGNESIUM: Magnesium: 2.3 mg/dL (ref 1.7–2.4)

## 2021-10-18 LAB — PHOSPHORUS: Phosphorus: 6.6 mg/dL — ABNORMAL HIGH (ref 2.5–4.6)

## 2021-10-18 MED ORDER — BISACODYL 10 MG RE SUPP
10.0000 mg | Freq: Once | RECTAL | Status: AC
  Administered 2021-10-18: 18:00:00 10 mg via RECTAL
  Filled 2021-10-18: qty 1

## 2021-10-18 MED ORDER — SODIUM CHLORIDE 0.9 % IV SOLN: Freq: Once | INTRAVENOUS | Status: AC

## 2021-10-18 MED ORDER — ENOXAPARIN SODIUM 30 MG/0.3ML IJ SOSY
30.0000 mg | PREFILLED_SYRINGE | INTRAMUSCULAR | Status: DC
  Administered 2021-10-19 – 2021-10-22 (×4): 30 mg via SUBCUTANEOUS
  Filled 2021-10-18 (×4): qty 0.3

## 2021-10-18 NOTE — TOC Progression Note (Signed)
Transition of Care [facility]) - Progression Note    Patient Details  Name: Andrew Adams MRN: [id number] Date of Birth: Aug 13, 1946  Transition of Care [facility]) CM/SW Contact  Salome Arnt, [place] Phone Number: 10/18/2021, 10:54 AM  Clinical Narrative:    Transition of Care [facility]) Screening Note   Patient Details  Name: Andrew Adams Date of Birth: [birth year]   Transition of Care [facility]) CM/SW Contact:    Salome Arnt, [place] Phone Number: 10/18/2021, 10:54 AM    Transition of Care Department [facility]) has reviewed patient and no TOC needs have been identified at this time. We will continue to monitor patient advancement through interdisciplinary progression rounds. If new patient transition needs arise, please place a TOC consult.           Expected Discharge Plan and Services                                                 Social Determinants of Health (SDOH) Interventions    Readmission Risk Interventions No flowsheet data found.

## 2021-10-18 NOTE — Progress Notes (Signed)
Subjective: No bowel movement or flatus yet.  Does feel less distended with working NGT.  Objective: Vital signs in last 24 hours: Temp:  [97.5 F (36.4 C)-99.9 F (37.7 C)] 99.9 F (37.7 C) (12/19 1450) Pulse Rate:  [67-90] 87 (12/19 1450) Resp:  [18-23] 19 (12/19 1450) BP: (102-145)/(70-94) 113/70 (12/19 1450) SpO2:  [91 %-98 %] 91 % (12/19 1450) FiO2 (%):  [21 %] 21 % (12/18 2137) Weight:  [96.6 kg] 96.6 kg (12/18 2134) Last BM Date: 10/17/21  Intake/Output from previous day: 12/18 0701 - 12/19 0700 In: 500 [IV Piggyback:500] Out: 600 [Emesis/NG output:600] Intake/Output this shift: Total I/O In: 0  Out: 800 [Urine:800]  General appearance: alert, cooperative, and no distress Resp: clear to auscultation bilaterally Cardio: regular rate and rhythm, S1, S2 normal, no murmur, click, rub or gallop GI: Soft, significantly distended but not tense.  Multiple surgical scars present.  No bowel sounds appreciated.  Lab Results:  Recent Labs    10/17/21 1632 10/18/21 0354  WBC 8.2 7.4  HGB 11.7* 10.5*  HCT 36.1* 32.1*  PLT 198 188   BMET Recent Labs    10/17/21 1632 10/18/21 0354  NA 141 141  K 3.8 4.2  CL 99 100  CO2 32 29  GLUCOSE 143* 114*  BUN 28* 39*  CREATININE 2.39* 3.16*  CALCIUM 9.1 8.4*   PT/INR No results for input(s): LABPROT, INR in the last 72 hours.  Studies/Results: CT Abdomen Pelvis Wo Contrast  Result Date: 10/17/2021 CLINICAL DATA:  Abdominal pain and possible bowel obstruction EXAM: CT ABDOMEN AND PELVIS WITHOUT CONTRAST TECHNIQUE: Multidetector CT imaging of the abdomen and pelvis was performed following the standard protocol without IV contrast. COMPARISON:  10/20/2020, 02/19/2021 FINDINGS: Lower chest: Lung bases demonstrate mild scarring bilaterally. Stable nodular changes are noted in the bases bilaterally stable from prior CT. The dominant nodule in the left lower lobe posteriorly measures approximately 16 mm and appears stable from  the prior CT examination from 02/19/2021. Hepatobiliary: No focal liver abnormality is seen. No gallstones, gallbladder wall thickening, or biliary dilatation. Pancreas: Unremarkable. No pancreatic ductal dilatation or surrounding inflammatory changes. Spleen: Normal in size without focal abnormality. Adrenals/Urinary Tract: Adrenal glands are within normal limits bilaterally. Kidneys demonstrate scattered hypodensities likely representing cysts which appears stable in appearance from the prior exam. No renal calculi or obstructive changes are seen. Bladder is partially distended. Stomach/Bowel: Diverticular change of the colon is noted without definitive diverticulitis. The appendix is not well visualized although no inflammatory changes to suggest appendicitis are seen. Small bowel dilatation is noted throughout with evidence of fecalization of bowel contents in the distal small bowel. Abrupt transition point is noted in the right lower abdomen best seen on axial image number 67 of series 2. This is likely related to adhesions from prior surgery. The stomach is well distended with contrast material. Vascular/Lymphatic: Aortic atherosclerosis. No enlarged abdominal or pelvic lymph nodes. Reproductive: Prostate is unremarkable. Other: No abdominal wall hernia or abnormality. No abdominopelvic ascites. 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 Degenerative changes of lumbar spine are noted. IMPRESSION: Changes consistent with high-grade small bowel obstruction with a transition zone in the right lower quadrant likely related to postoperative adhesions. Diverticulosis without diverticulitis. Nodular changes in the lung bases bilaterally. The largest of these lies in the left lower lobe and is stable from the prior exam of December of 2021. Follow-up exam in 1 year would be helpful to assess for  stability Electronically Signed   By: Inez Catalina M.D.   On: 10/17/2021  18:40   DG Chest Portable 1 View  Result Date: 10/17/2021 CLINICAL DATA:  NG tube verification. EXAM: PORTABLE CHEST 1 VIEW COMPARISON:  Chest x-ray 04/03/2020. FINDINGS: Nasogastric tube tip is looped upon itself in the distal esophagus with the distal portion of the tube projecting cranially ending at the level of the mid esophagus. IMPRESSION: 1. Nasogastric tube is looped on itself with tip in the mid thoracic esophagus. Recommend retraction and repositioning. Electronically Signed   By: Ronney Asters M.D.   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  Hopefully NG tube decompression will work as the patient is a high risk surgical candidate due to his multiple previous surgeries.  We will get small bowel obstruction series tomorrow.  Further management is pending those results.  Patient understands and agreed to the treatment plan.  We will give cathartics.  LOS: 1 day    Aviva Signs 10/18/2021

## 2021-10-18 NOTE — Progress Notes (Addendum)
RN called due to patient having worsening creatinine function and been n.p.o. since he was on NG tube.  BUN/creatinine currently at 39/3.16 (this has progressively increased from 2.20 since last 2 months), this may be due to diuretic effect.  Chart was reviewed, she appeared to have grade 1 diastolic dysfunction based on echocardiogram done in December 2020.  IV hydration was provided.  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

## 2021-10-19 ENCOUNTER — Inpatient Hospital Stay (HOSPITAL_COMMUNITY): Payer: Medicare HMO

## 2021-10-19 DIAGNOSIS — K565 Intestinal adhesions [bands], unspecified as to partial versus complete obstruction: Principal | ICD-10-CM

## 2021-10-19 DIAGNOSIS — N184 Chronic kidney disease, stage 4 (severe): Secondary | ICD-10-CM | POA: Diagnosis present

## 2021-10-19 DIAGNOSIS — D631 Anemia in chronic kidney disease: Secondary | ICD-10-CM

## 2021-10-19 DIAGNOSIS — Z862 Personal history of diseases of the blood and blood-forming organs and certain disorders involving the immune mechanism: Secondary | ICD-10-CM

## 2021-10-19 LAB — CBC
HCT: 31.1 % — ABNORMAL LOW (ref 39.0–52.0)
HCT: 30.7 % — ABNORMAL LOW (ref 39.0–52.0)
Hemoglobin: 9.8 g/dL — ABNORMAL LOW (ref 13.0–17.0)
Hemoglobin: 9.8 g/dL — ABNORMAL LOW (ref 13.0–17.0)
MCH: 26.8 pg (ref 26.0–34.0)
MCH: 27.3 pg (ref 26.0–34.0)
MCHC: 31.5 g/dL (ref 30.0–36.0)
MCHC: 31.9 g/dL (ref 30.0–36.0)
MCV: 85.2 fL (ref 80.0–100.0)
MCV: 85.5 fL (ref 80.0–100.0)
Platelets: 166 10*3/uL (ref 150–400)
Platelets: 159 10*3/uL (ref 150–400)
RBC: 3.65 MIL/uL — ABNORMAL LOW (ref 4.22–5.81)
RBC: 3.59 MIL/uL — ABNORMAL LOW (ref 4.22–5.81)
RDW: 14.6 % (ref 11.5–15.5)
RDW: 14.6 % (ref 11.5–15.5)
WBC: 5.4 10*3/uL (ref 4.0–10.5)
WBC: 4.8 10*3/uL (ref 4.0–10.5)
nRBC: 0 % (ref 0.0–0.2)
nRBC: 0 % (ref 0.0–0.2)

## 2021-10-19 LAB — BASIC METABOLIC PANEL
Anion gap: 11 (ref 5–15)
BUN: 58 mg/dL — ABNORMAL HIGH (ref 8–23)
CO2: 28 mmol/L (ref 22–32)
Calcium: 8.2 mg/dL — ABNORMAL LOW (ref 8.9–10.3)
Chloride: 103 mmol/L (ref 98–111)
Creatinine, Ser: 4.02 mg/dL — ABNORMAL HIGH (ref 0.61–1.24)
GFR, Estimated: 15 mL/min — ABNORMAL LOW (ref >60)
Glucose, Bld: 97 mg/dL (ref 70–99)
Potassium: 3.7 mmol/L (ref 3.5–5.1)
Sodium: 142 mmol/L (ref 135–145)

## 2021-10-19 LAB — PHOSPHORUS: Phosphorus: 5.4 mg/dL — ABNORMAL HIGH (ref 2.5–4.6)

## 2021-10-19 LAB — GLUCOSE, CAPILLARY
Glucose-Capillary: 89 mg/dL (ref 70–99)
Glucose-Capillary: 89 mg/dL (ref 70–99)

## 2021-10-19 LAB — MAGNESIUM: Magnesium: 2.6 mg/dL — ABNORMAL HIGH (ref 1.7–2.4)

## 2021-10-19 MED ORDER — PANTOPRAZOLE SODIUM 40 MG IV SOLR
40.0000 mg | INTRAVENOUS | Status: DC
  Administered 2021-10-19 – 2021-10-21 (×3): 40 mg via INTRAVENOUS
  Filled 2021-10-19 (×3): qty 40

## 2021-10-19 MED ORDER — DIATRIZOATE MEGLUMINE & SODIUM 66-10 % PO SOLN
ORAL | Status: AC
  Filled 2021-10-19: qty 90

## 2021-10-19 MED ORDER — FENTANYL CITRATE PF 50 MCG/ML IJ SOSY
25.0000 ug | PREFILLED_SYRINGE | INTRAMUSCULAR | Status: DC | PRN
  Administered 2021-10-19 – 2021-10-22 (×8): 25 ug via INTRAVENOUS
  Filled 2021-10-19 (×9): qty 1

## 2021-10-19 MED ORDER — DIATRIZOATE MEGLUMINE & SODIUM 66-10 % PO SOLN
90.0000 mL | Freq: Once | ORAL | Status: AC
  Administered 2021-10-19: 11:00:00 90 mL via NASOGASTRIC
  Filled 2021-10-19: qty 90

## 2021-10-19 MED ORDER — BISACODYL 10 MG RE SUPP
10.0000 mg | Freq: Once | RECTAL | Status: AC
  Administered 2021-10-19: 18:00:00 10 mg via RECTAL
  Filled 2021-10-19: qty 1

## 2021-10-19 MED ORDER — SENNOSIDES-DOCUSATE SODIUM 8.6-50 MG PO TABS
2.0000 | ORAL_TABLET | Freq: Every day | ORAL | Status: DC
  Administered 2021-10-20: 22:00:00 2 via ORAL
  Filled 2021-10-19 (×4): qty 2

## 2021-10-19 MED ORDER — ALBUTEROL SULFATE (2.5 MG/3ML) 0.083% IN NEBU: 2.5000 mg | INHALATION_SOLUTION | RESPIRATORY_TRACT | Status: DC | PRN

## 2021-10-19 MED ORDER — SODIUM CHLORIDE 0.9 % IV SOLN: INTRAVENOUS | Status: DC

## 2021-10-19 MED ORDER — SODIUM CHLORIDE 0.9 % IV BOLUS
1000.0000 mL | Freq: Once | INTRAVENOUS | Status: AC
  Administered 2021-10-19: 18:00:00 1000 mL via INTRAVENOUS

## 2021-10-19 MED ORDER — LABETALOL HCL 5 MG/ML IV SOLN: 10.0000 mg | INTRAVENOUS | Status: DC | PRN

## 2021-10-19 NOTE — Progress Notes (Signed)
NG tube advanced about 3.5in per radiology recommendations and MD. Pt tolerated well. Dye administered and radiology informed. Will unclamp at 12:19pm per instructions.

## 2021-10-19 NOTE — Progress Notes (Addendum)
Subjective: Patient states he is passing gas.  Had a small BM.  Has developed a little cough.  Objective: Vital signs in last 24 hours: Temp:  [98.6 F (37 C)-100 F (37.8 C)] 98.6 F (37 C) (12/20 0806) Pulse Rate:  [83-90] 86 (12/20 0806) Resp:  [18-19] 18 (12/20 0436) BP: (113-144)/(68-86) 144/86 (12/20 0806) SpO2:  [91 %-96 %] 92 % (12/20 0806) Last BM Date: 10/17/21  Intake/Output from previous day: 12/19 0701 - 12/20 0700 In: 0  Out: 2750 [Urine:1750; Emesis/NG output:1000] Intake/Output this shift: No intake/output data recorded.  General appearance: alert, cooperative, and no distress Resp: Mild expiratory wheezing noted bilaterally Cardio: regular rate and rhythm, S1, S2 normal, no murmur, click, rub or gallop GI: Soft, less distended.  Occasional bowel sounds appreciated.  No rigidity is noted.  Lab Results:  Recent Labs    10/18/21 0354 10/19/21 0551  WBC 7.4 5.4  HGB 10.5* 9.8*  HCT 32.1* 31.1*  PLT 188 166   BMET Recent Labs    10/18/21 0354 10/19/21 0551  NA 141 142  K 4.2 3.7  CL 100 103  CO2 29 28  GLUCOSE 114* 97  BUN 39* 58*  CREATININE 3.16* 4.02*  CALCIUM 8.4* 8.2*   PT/INR No results for input(s): LABPROT, INR in the last 72 hours.  Studies/Results: CT Abdomen Pelvis Wo Contrast  Result Date: 10/17/2021 CLINICAL DATA:  Abdominal pain and possible bowel obstruction EXAM: CT ABDOMEN AND PELVIS WITHOUT CONTRAST TECHNIQUE: Multidetector CT imaging of the abdomen and pelvis was performed following the standard protocol without IV contrast. COMPARISON:  10/20/2020, 02/19/2021 FINDINGS: Lower chest: Lung bases demonstrate mild scarring bilaterally. Stable nodular changes are noted in the bases bilaterally stable from prior CT. The dominant nodule in the left lower lobe posteriorly measures approximately 16 mm and appears stable from the prior CT examination from 02/19/2021. Hepatobiliary: No focal liver abnormality is seen. No gallstones,  gallbladder wall thickening, or biliary dilatation. Pancreas: Unremarkable. No pancreatic ductal dilatation or surrounding inflammatory changes. Spleen: Normal in size without focal abnormality. Adrenals/Urinary Tract: Adrenal glands are within normal limits bilaterally. Kidneys demonstrate scattered hypodensities likely representing cysts which appears stable in appearance from the prior exam. No renal calculi or obstructive changes are seen. Bladder is partially distended. Stomach/Bowel: Diverticular change of the colon is noted without definitive diverticulitis. The appendix is not well visualized although no inflammatory changes to suggest appendicitis are seen. Small bowel dilatation is noted throughout with evidence of fecalization of bowel contents in the distal small bowel. Abrupt transition point is noted in the right lower abdomen best seen on axial image number 67 of series 2. This is likely related to adhesions from prior surgery. The stomach is well distended with contrast material. Vascular/Lymphatic: Aortic atherosclerosis. No enlarged abdominal or pelvic lymph nodes. Reproductive: Prostate is unremarkable. Other: No abdominal wall hernia or abnormality. No abdominopelvic ascites. 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 Degenerative changes of lumbar spine are noted. IMPRESSION: Changes consistent with high-grade small bowel obstruction with a transition zone in the right lower quadrant likely related to postoperative adhesions. Diverticulosis without diverticulitis. Nodular changes in the lung bases bilaterally. The largest of these lies in the left lower lobe and is stable from the prior exam of December of 2021. Follow-up exam in 1 year would be helpful to assess for stability Electronically Signed   By: Inez Catalina M.D.   On: 10/17/2021 18:40   DG Chest Portable  1 View  Result Date: 10/17/2021 CLINICAL DATA:  NG tube verification. EXAM:  PORTABLE CHEST 1 VIEW COMPARISON:  Chest x-ray 04/03/2020. FINDINGS: Nasogastric tube tip is looped upon itself in the distal esophagus with the distal portion of the tube projecting cranially ending at the level of the mid esophagus. IMPRESSION: 1. Nasogastric tube is looped on itself with tip in the mid thoracic esophagus. Recommend retraction and repositioning. Electronically Signed   By: Ronney Asters M.D.   On: 10/17/2021 20:10   DG Abd Portable 1V-Small Bowel Protocol-Position Verification  Result Date: 10/19/2021 CLINICAL DATA:  NG tube placement, history of small-bowel obstruction. EXAM: PORTABLE ABDOMEN - 1 VIEW COMPARISON:  October 17, 2021. FINDINGS: A nasogastric tube has been placed, side port at or just above the level of the GE junction. There are numerous small bowel loops with dilation seen in the abdomen with similar distribution when compared to the previous CT evaluation. No abnormal calcification. On limited assessment no acute skeletal process. IMPRESSION: A nasogastric tube is been placed, side port at or just above the level of the GE junction. Approximately 3-4 cm advancement may be helpful for more optimal placement. Signs of continued small bowel obstruction. Electronically Signed   By: Zetta Bills M.D.   On: 10/19/2021 08:35    Anti-infectives: Anti-infectives (From admission, onward)    None       Assessment/Plan: Impression: Small bowel obstruction, possibly slowly resolving.  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  Also has worsening renal function.  I have formally consulted the hospitalist service.  May need nephrology consultation.  We will gently hydrate.  We will get small bowel obstruction protocol series today.  Further management is pending those results. Patient states his breathing is at baseline.  He does do nebulizer treatments at home.  We will continue neb treatments in the hospital.  LOS: 2 days    Aviva Signs 10/19/2021

## 2021-10-19 NOTE — Consult Note (Signed)
Patient Demographics:    Andrew Adams, is a 75 y.o. male  MRN: [id number]   DOB - Aug 11, 1946  Admit Date - 10/17/2021  Outpatient Primary MD for the patient is Lavella Lemons, [place]   Assessment & Plan:    Principal Problem:   Small bowel obstruction due to adhesions [facility]) Active Problems:   CKD (chronic kidney disease), stage IV (HCC)   History of anemia due to CKD   Essential hypertension   COPD (chronic obstructive pulmonary disease) ([place])   1)AKI----acute kidney injury on CKD stage - IV---due to volume depletion/dehydration in the setting of SBO without oral intake compounded by high-dose Lasix and Avapro use --Creatinine on admission on 10/17/2021 was 2.39 which is close to patient's baseline, creatinine today is up to 4.0, potassium is 3.7 bicarb is 28, anion gap 11 -Patient did Not get contrast with CT on 10/17/2021 -CT abdomen and pelvis on admission on 10/17/2021 did not show any renal calculi or any obstructive uropathy on CT abdomen and pelvis -Renal ultrasound from 07/28/2021 was also without obstructive uropathy or any other significant renal findings except for chronic cyst -Avoid ACEI/ARB/ARNI due to renal concerns Hold Lasix and Avapro -IVF as ordered - renally adjust medications, avoid nephrotoxic agents / dehydration  / hypotension   Medication Issue:- Preferred narcotic agents for pain control are hydromorphone, fentanyl, and methadone. Morphine should not be used.  Baclofen should be avoided Avoid oral sodium phosphate and magnesium citrate based laxatives / bowel preps    2)High Grade SBO- presumably  from adhesions  CT abdomen and pelvis on admission on 10/17/2021 showed high-grade obstruction- Changes consistent with high-grade small bowel obstruction with a transition zone in the right  lower quadrant likely related to postoperative adhesions. -Currently has NG tube -Further management per general surgery  -Discontinue morphine sulfate , use IV fentanyl instead due to kidney concerns  3)COPD/OSA--unable to use CPAP/BiPAP due to continuous NG suction -No acute COPD exacerbation at this time -Bronchodilators as needed as ordered  4)HTN--- hold Lasix and Avapro as above due to AKI -Okay to use oral amlodipine and Coreg with clamping of the NG tube for couple of hours after administration  may use IV labetalol when necessary  Every 4 hours for systolic blood pressure over 170 mmhg  5)GERD--IV Protonix for GI prophylaxis given SBO with NG tube in situ, patient with history of EtOH and tobacco abuse with increased risk for stress ulcers and GI bleed  6)HFpEF--history of chronic diastolic dysfunction CHF, EF was 55 to 60% per echo from 10/30/2019 with grade 1 diastolic dysfunction --Okay to stop Lasix as above in #1 -IV fluids as above #1 -Currently patient appears to be volume depleted/dehydrated -Monitor closely to avoid volume overload  7) chronic anemia of CKD--- H&H may drop with hydration, monitor closely and transfuse as needed, consider ESA/Procrit as indicated  -Thank you Dr. Arnoldo Morale for this consult request -Hospitalist team will follow patient concurrently  with you  Disposition/Need for in-Hospital Stay- patient unable to be discharged at this time due to --- AKI requiring IV fluids and medication adjustments, SBO requiring IV fluids --Awaiting return of bowel function and tolerance of oral intake prior to making decision on discharge  Status is: Inpatient  Remains inpatient appropriate because: Disposition above  Dispo: The patient is from: Home              Anticipated d/c is to: Home              Anticipated d/c date is: 3 days              Patient currently is not medically stable to d/c. Barriers: Not Clinically Stable-  With History of - Reviewed by  me  Past Medical History:  Diagnosis Date   Anemia    Borderline diabetic    Chronic kidney disease    Duodenal adenoma    2021   GERD (gastroesophageal reflux disease)    Gout    Gouty arthritis    H/O small bowel obstruction    History of back surgery    Hx of adenomatous colonic polyps    2021   Hyperlipidemia    Hypertension    PE (pulmonary embolism)    Sleep apnea       Past Surgical History:  Procedure Laterality Date   ABDOMINAL SURGERY  2103   Smal bowel obstruction    ABDOMINAL SURGERY  1981   Perforated large intestine   back     Lower back   BIOPSY  08/28/2020   Procedure: BIOPSY;  Surgeon: Harvel Quale, MD;  [facility];  Service: Gastroenterology;;   CATARACT EXTRACTION W/PHACO Right 01/06/2020   Procedure: CATARACT EXTRACTION PHACO AND INTRAOCULAR LENS PLACEMENT (IOC) (CDE: 6.48);  Surgeon: [name], MD;  Location: [facility];  Service: Ophthalmology;  Laterality: Right;   CATARACT EXTRACTION W/PHACO Left 01/20/2020   Procedure: CATARACT EXTRACTION PHACO AND INTRAOCULAR LENS PLACEMENT (IOC);  Surgeon: [name], MD;  Location: [facility];  Service: Ophthalmology;  Laterality: Left;  CDE: 5.86   COLONOSCOPY WITH PROPOFOL N/A 08/28/2020   Procedure: COLONOSCOPY WITH PROPOFOL;  Surgeon: Harvel Quale, MD;  [facility];  Service: Gastroenterology;  Laterality: N/A;  230   ESOPHAGOGASTRODUODENOSCOPY (EGD) WITH PROPOFOL N/A 08/28/2020   Procedure: ESOPHAGOGASTRODUODENOSCOPY (EGD) WITH PROPOFOL;  Surgeon: Harvel Quale, MD;  [facility];  Service: Gastroenterology;  Laterality: N/A;   ESOPHAGOGASTRODUODENOSCOPY (EGD) WITH PROPOFOL N/A 12/14/2020   Procedure: ESOPHAGOGASTRODUODENOSCOPY (EGD) WITH PROPOFOL;  Surgeon: Rush Landmark Telford Nab., MD;  Location: [facility];  Service: Gastroenterology;  Laterality: N/A;   GIVENS CAPSULE STUDY N/A 09/10/2020   Procedure: GIVENS CAPSULE STUDY;  Surgeon:  Harvel Quale, MD;  [facility];  Service: Gastroenterology;  Laterality: N/A;  [place]  12/14/2020   Procedure: HEMOSTASIS CLIP PLACEMENT;  Surgeon: Irving Copas., MD;  Location: [facility];  Service: Gastroenterology;;   HEMOSTASIS CONTROL  12/14/2020   Procedure: HEMOSTASIS CONTROL;  Surgeon: Irving Copas., MD;  Location: [facility];  Service: Gastroenterology;;   HERNIA REPAIR     incisional   POLYPECTOMY  08/28/2020   Procedure: POLYPECTOMY;  Surgeon: Harvel Quale, MD;  [facility];  Service: Gastroenterology;;   POLYPECTOMY  12/14/2020   Procedure: POLYPECTOMY;  Surgeon: Irving Copas., MD;  Location: [facility];  Service: Gastroenterology;;   Clide Deutscher  12/14/2020  Procedure: SCLEROTHERAPY;  Surgeon: Irving Copas., MD;  Location: [facility];  Service: Gastroenterology;;   SUBMUCOSAL TATTOO INJECTION  12/14/2020   Procedure: SUBMUCOSAL TATTOO INJECTION;  Surgeon: Irving Copas., MD;  Location: [facility];  Service: Gastroenterology;;    Chief Complaint  Patient presents with   Abdominal Pain      HPI:    Andrew Adams  is a 75 y.o. male reformed smoker with past medical history relevant for mixed type COPD , OSA, CKD stage IV , HTN, GERD , prior history of abdominal surgeries and chronic anemia of CKD who was admitted on 10/17/2021 with high-grade SBO- -Patient has been on surgical service with n.p.o. status, but was also receiving Lasix 80 mg daily, as well as Avapro -General surgery service requested hospitalist consult due to worsening renal function -CT abdomen and pelvis on admission on 10/17/2021 showed high-grade obstruction- Changes consistent with high-grade small bowel obstruction with a transition zone in the right lower quadrant likely related to postoperative adhesions. -There were no renal calculi or any obstructive uropathy on CT abdomen  and pelvis -Renal ultrasound from 07/28/2021 was also without obstructive uropathy or any other significant renal findings except for chronic cyst -Creatinine on admission on 10/17/2021 was 2.39 which is close to patient's baseline, creatinine today is up to 4.0, potassium is 3.7 bicarb is 28, anion gap 11 - No fever  Or chills , patient has NG tube with good output -Apparently urine output has not been great -    Review of systems:    In addition to the HPI above,   A full Review of  Systems was done, all other systems reviewed are negative except as noted above in HPI , .    Social History:  Reviewed by me    Social History   Tobacco Use   Smoking status: Former    Packs/day: 1.00    Years: 38.00    Pack years: 38.00    Types: Cigarettes    Quit date: 02/07/1995    Years since quitting: 26.7   Smokeless tobacco: Never  Substance Use Topics   Alcohol use: Yes    Comment: occasional      Family History :  Reviewed by me    Family History  Problem Relation Age of Onset   Hypertension Sister    Diabetes Sister    Diabetes Sister    Hypertension Mother    Diabetes Mother    Hypertension Father    Ulcers Father    Colon cancer Neg Hx    Esophageal cancer Neg Hx    Stomach cancer Neg Hx    Inflammatory bowel disease Neg Hx    Liver disease Neg Hx    Pancreatic cancer Neg Hx    Rectal cancer Neg Hx      Home Medications:   Prior to Admission medications   Medication Sig Start Date End Date Taking? Authorizing Provider  albuterol (VENTOLIN HFA) 108 (90 Base) MCG/ACT inhaler Inhale into the lungs. 11/24/17  Yes [provider]  allopurinol (ZYLOPRIM) 100 MG tablet Take 1 tablet (100 mg total) by mouth daily. 02/24/14  Yes Martin, Mary-Margaret, FNP  amLODipine (NORVASC) 10 MG tablet Take 10 mg by mouth daily. 12/20/19  Yes [provider]  atorvastatin (LIPITOR) 40 MG tablet Take 1 tablet (40 mg total) by mouth daily. 02/24/14  Yes Martin,  Mary-Margaret, FNP  carvedilol (COREG) 12.5 MG tablet Take 12.5 mg by mouth 2 (two) times daily with a  meal.   Yes [provider]  epoetin alfa-epbx (RETACRIT) 3000 UNIT/ML injection Inject 3,000 Units into the vein every 14 (fourteen) days.    Yes [provider]  FEROSUL 325 (65 Fe) MG tablet TAKE ONE TABLET BY MOUTH EVERY MORNING WITH BREAKFAST 12/25/20  Yes Montez Morita, Quillian Quince, MD  furosemide (LASIX) 40 MG tablet Take 80 mg by mouth daily.   Yes [provider]  ipratropium-albuterol (DUONEB) 0.5-2.5 (3) MG/3ML SOLN Take 3 mLs by nebulization every 6 (six) hours as needed. 01/26/21  Yes Chesley Mires, MD  Multiple Vitamin (MULTIVITAMIN WITH MINERALS) TABS tablet Take 1 tablet by mouth daily. 02/16/20  Yes Johnson, Clanford L, MD  pantoprazole (PROTONIX) 40 MG tablet Take 1 tablet (40 mg total) by mouth 2 (two) times daily before a meal. Patient taking differently: Take 40 mg by mouth daily. 12/14/20  Yes Mansouraty, Telford Nab., MD  TRULICITY 8.33 AS/5.0NL SOPN Inject 0.75 mg into the skin every 7 (seven) days.    Yes [provider]  valsartan (DIOVAN) 40 MG tablet Take 40 mg by mouth daily. 10/11/21  Yes [provider]  amLODipine (NORVASC) 10 MG tablet Take by mouth. Patient not taking: Reported on 10/17/2021 12/24/20 12/24/21  [provider]  losartan (COZAAR) 50 MG tablet TAKE ONE TABLET BY MOUTH TWICE DAILY Patient not taking: Reported on 10/17/2021 06/29/21   Tanda Rockers, MD  metoprolol (LOPRESSOR) 50 MG tablet Take 1 tablet (50 mg total) by mouth 2 (two) times daily. Patient not taking: Reported on 10/17/2021 02/24/14   Chevis Pretty, FNP  OXYGEN Inhale 2 L into the lungs continuous. Patient not taking: Reported on 10/17/2021    [provider]     Allergies:    No Known Allergies   Physical Exam:   Vitals  Blood pressure 124/79, pulse 75, temperature 98.2 F (36.8 C), temperature source Oral, resp.  rate 20, height 5\' 9"  (1.753 m), weight 96.6 kg, SpO2 91 %.  Physical Examination: General appearance - alert,  and in no distress  Mental status - alert, oriented to person, place, and time,  Eyes - sclera anicteric Nose-NG Tube with gastric contents Neck - supple, no JVD elevation , Chest - clear  to auscultation bilaterally, symmetrical air movement,  Heart - S1 and S2 normal, regular  Abdomen - soft, distended, multiple ventral scars/hernia from prior laparotomy , very diminished bowel sounds, generalized tenderness without rebound or guarding --neurological - screening mental status exam normal, neck supple without rigidity, cranial nerves II through XII intact, DTR's normal and symmetric Extremities - no pedal edema noted, intact peripheral pulses  Skin - warm, dry     Data Review:    CBC Recent Labs  Lab 10/17/21 1632 10/18/21 0354 10/19/21 0551  WBC 8.2 7.4 5.4  HGB 11.7* 10.5* 9.8*  HCT 36.1* 32.1* 31.1*  PLT 198 188 166  MCV 83.4 84.3 85.2  MCH 27.0 27.6 26.8  MCHC 32.4 32.7 31.5  RDW 14.6 14.6 14.6  LYMPHSABS 1.1  --   --   MONOABS 0.5  --   --   EOSABS 0.1  --   --   BASOSABS 0.0  --   --    ------------------------------------------------------------------------------------------------------------------  Chemistries  Recent Labs  Lab 10/17/21 1632 10/18/21 0354 10/19/21 0551  NA 141 141 142  K 3.8 4.2 3.7  CL 99 100 103  CO2 32 29 28  GLUCOSE 143* 114* 97  BUN 28* 39* 58*  CREATININE 2.39* 3.16*  4.02*  CALCIUM 9.1 8.4* 8.2*  MG  --  2.3 2.6*  AST 21  --   --   ALT 17  --   --   ALKPHOS 70  --   --   BILITOT 0.9  --   --    ------------------------------------------------------------------------------------------------------------------ estimated creatinine clearance is 17.7 mL/min (A) (by C-G formula based on SCr of 4.02 mg/dL  (H)). ------------------------------------------------------------------------------------------------------------------ No results for input(s): TSH, T4TOTAL, T3FREE, THYROIDAB in the last 72 hours.  Invalid input(s): FREET3   Coagulation profile No results for input(s): INR, PROTIME in the last 168 hours. ------------------------------------------------------------------------------------------------------------------- No results for input(s): DDIMER in the last 72 hours. -------------------------------------------------------------------------------------------------------------------  Cardiac Enzymes No results for input(s): CKMB, TROPONINI, MYOGLOBIN in the last 168 hours.  Invalid input(s): CK ------------------------------------------------------------------------------------------------------------------    Component Value Date/Time   BNP 537.0 (H) 02/10/2020 1243     ---------------------------------------------------------------------------------------------------------------  Urinalysis    Component Value Date/Time   COLORURINE STRAW (A) 02/10/2020 2122   APPEARANCEUR CLEAR 02/10/2020 2122   LABSPEC 1.005 02/10/2020 2122   PHURINE 5.0 02/10/2020 2122   GLUCOSEU NEGATIVE 02/10/2020 2122   HGBUR NEGATIVE 02/10/2020 2122   BILIRUBINUR NEGATIVE 02/10/2020 2122   BILIRUBINUR negative 01/20/2014 1032   KETONESUR NEGATIVE 02/10/2020 2122   PROTEINUR NEGATIVE 02/10/2020 2122   UROBILINOGEN negative 01/20/2014 1032   NITRITE NEGATIVE 02/10/2020 2122   LEUKOCYTESUR NEGATIVE 02/10/2020 2122    ----------------------------------------------------------------------------------------------------------------   Imaging Results:    CT Abdomen Pelvis Wo Contrast  Result Date: 10/17/2021 CLINICAL DATA:  Abdominal pain and possible bowel obstruction EXAM: CT ABDOMEN AND PELVIS WITHOUT CONTRAST TECHNIQUE: Multidetector CT imaging of the abdomen and pelvis was performed  following the standard protocol without IV contrast. COMPARISON:  10/20/2020, 02/19/2021 FINDINGS: Lower chest: Lung bases demonstrate mild scarring bilaterally. Stable nodular changes are noted in the bases bilaterally stable from prior CT. The dominant nodule in the left lower lobe posteriorly measures approximately 16 mm and appears stable from the prior CT examination from 02/19/2021. Hepatobiliary: No focal liver abnormality is seen. No gallstones, gallbladder wall thickening, or biliary dilatation. Pancreas: Unremarkable. No pancreatic ductal dilatation or surrounding inflammatory changes. Spleen: Normal in size without focal abnormality. Adrenals/Urinary Tract: Adrenal glands are within normal limits bilaterally. Kidneys demonstrate scattered hypodensities likely representing cysts which appears stable in appearance from the prior exam. No renal calculi or obstructive changes are seen. Bladder is partially distended. Stomach/Bowel: Diverticular change of the colon is noted without definitive diverticulitis. The appendix is not well visualized although no inflammatory changes to suggest appendicitis are seen. Small bowel dilatation is noted throughout with evidence of fecalization of bowel contents in the distal small bowel. Abrupt transition point is noted in the right lower abdomen best seen on axial image number 67 of series 2. This is likely related to adhesions from prior surgery. The stomach is well distended with contrast material. Vascular/Lymphatic: Aortic atherosclerosis. No enlarged abdominal or pelvic lymph nodes. Reproductive: Prostate is unremarkable. Other: No abdominal wall hernia or abnormality. No abdominopelvic ascites. 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 Degenerative changes of lumbar spine are noted. IMPRESSION: Changes consistent with high-grade small bowel obstruction with a transition zone in the right lower quadrant likely related  to postoperative adhesions. Diverticulosis without diverticulitis. Nodular changes in the lung bases bilaterally. The largest of these lies in the left lower lobe and is stable from the prior exam of December of 2021. Follow-up exam in 1 year would be  helpful to assess for stability Electronically Signed   By: Inez Catalina M.D.   On: 10/17/2021 18:40   DG Chest Portable 1 View  Result Date: 10/17/2021 CLINICAL DATA:  NG tube verification. EXAM: PORTABLE CHEST 1 VIEW COMPARISON:  Chest x-ray 04/03/2020. FINDINGS: Nasogastric tube tip is looped upon itself in the distal esophagus with the distal portion of the tube projecting cranially ending at the level of the mid esophagus. IMPRESSION: 1. Nasogastric tube is looped on itself with tip in the mid thoracic esophagus. Recommend retraction and repositioning. Electronically Signed   By: Ronney Asters M.D.   On: 10/17/2021 20:10   DG Abd Portable 1V-Small Bowel Protocol-Position Verification  Result Date: 10/19/2021 CLINICAL DATA:  NG tube placement, history of small-bowel obstruction. EXAM: PORTABLE ABDOMEN - 1 VIEW COMPARISON:  October 17, 2021. FINDINGS: A nasogastric tube has been placed, side port at or just above the level of the GE junction. There are numerous small bowel loops with dilation seen in the abdomen with similar distribution when compared to the previous CT evaluation. No abnormal calcification. On limited assessment no acute skeletal process. IMPRESSION: A nasogastric tube is been placed, side port at or just above the level of the GE junction. Approximately 3-4 cm advancement may be helpful for more optimal placement. Signs of continued small bowel obstruction. Electronically Signed   By: Zetta Bills M.D.   On: 10/19/2021 08:35    Radiological Exams on Admission: CT Abdomen Pelvis Wo Contrast  Result Date: 10/17/2021 CLINICAL DATA:  Abdominal pain and possible bowel obstruction EXAM: CT ABDOMEN AND PELVIS WITHOUT CONTRAST  TECHNIQUE: Multidetector CT imaging of the abdomen and pelvis was performed following the standard protocol without IV contrast. COMPARISON:  10/20/2020, 02/19/2021 FINDINGS: Lower chest: Lung bases demonstrate mild scarring bilaterally. Stable nodular changes are noted in the bases bilaterally stable from prior CT. The dominant nodule in the left lower lobe posteriorly measures approximately 16 mm and appears stable from the prior CT examination from 02/19/2021. Hepatobiliary: No focal liver abnormality is seen. No gallstones, gallbladder wall thickening, or biliary dilatation. Pancreas: Unremarkable. No pancreatic ductal dilatation or surrounding inflammatory changes. Spleen: Normal in size without focal abnormality. Adrenals/Urinary Tract: Adrenal glands are within normal limits bilaterally. Kidneys demonstrate scattered hypodensities likely representing cysts which appears stable in appearance from the prior exam. No renal calculi or obstructive changes are seen. Bladder is partially distended. Stomach/Bowel: Diverticular change of the colon is noted without definitive diverticulitis. The appendix is not well visualized although no inflammatory changes to suggest appendicitis are seen. Small bowel dilatation is noted throughout with evidence of fecalization of bowel contents in the distal small bowel. Abrupt transition point is noted in the right lower abdomen best seen on axial image number 67 of series 2. This is likely related to adhesions from prior surgery. The stomach is well distended with contrast material. Vascular/Lymphatic: Aortic atherosclerosis. No enlarged abdominal or pelvic lymph nodes. Reproductive: Prostate is unremarkable. Other: No abdominal wall hernia or abnormality. No abdominopelvic ascites. 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 Degenerative changes of lumbar spine are noted. IMPRESSION: Changes consistent with high-grade small bowel  obstruction with a transition zone in the right lower quadrant likely related to postoperative adhesions. Diverticulosis without diverticulitis. Nodular changes in the lung bases bilaterally. The largest of these lies in the left lower lobe and is stable from the prior exam of December of 2021. Follow-up exam in 1 year would be helpful  to assess for stability Electronically Signed   By: Inez Catalina M.D.   On: 10/17/2021 18:40   DG Chest Portable 1 View  Result Date: 10/17/2021 CLINICAL DATA:  NG tube verification. EXAM: PORTABLE CHEST 1 VIEW COMPARISON:  Chest x-ray 04/03/2020. FINDINGS: Nasogastric tube tip is looped upon itself in the distal esophagus with the distal portion of the tube projecting cranially ending at the level of the mid esophagus. IMPRESSION: 1. Nasogastric tube is looped on itself with tip in the mid thoracic esophagus. Recommend retraction and repositioning. Electronically Signed   By: Ronney Asters M.D.   On: 10/17/2021 20:10   DG Abd Portable 1V-Small Bowel Protocol-Position Verification  Result Date: 10/19/2021 CLINICAL DATA:  NG tube placement, history of small-bowel obstruction. EXAM: PORTABLE ABDOMEN - 1 VIEW COMPARISON:  October 17, 2021. FINDINGS: A nasogastric tube has been placed, side port at or just above the level of the GE junction. There are numerous small bowel loops with dilation seen in the abdomen with similar distribution when compared to the previous CT evaluation. No abnormal calcification. On limited assessment no acute skeletal process. IMPRESSION: A nasogastric tube is been placed, side port at or just above the level of the GE junction. Approximately 3-4 cm advancement may be helpful for more optimal placement. Signs of continued small bowel obstruction. Electronically Signed   By: Zetta Bills M.D.   On: 10/19/2021 08:35    DVT Prophylaxis -SCD/Lovenox AM Labs Ordered, also please review Full Orders  Family Communication: Admission, patients  condition and plan of care including tests being ordered have been discussed with the patient  who indicate understanding and agree with the plan   Code Status - Full Code  Likely DC to home when function returns and tolerating oral intake  Condition   stable  Roxan Hockey M.D on 10/19/2021 at 4:35 PM Go to [email or web address] -  for contact info  [facility] - Office  [phone]

## 2021-10-20 ENCOUNTER — Inpatient Hospital Stay (HOSPITAL_COMMUNITY): Payer: Medicare HMO

## 2021-10-20 LAB — BASIC METABOLIC PANEL
Anion gap: 12 (ref 5–15)
BUN: 70 mg/dL — ABNORMAL HIGH (ref 8–23)
CO2: 27 mmol/L (ref 22–32)
Calcium: 8.4 mg/dL — ABNORMAL LOW (ref 8.9–10.3)
Chloride: 107 mmol/L (ref 98–111)
Creatinine, Ser: 3.91 mg/dL — ABNORMAL HIGH (ref 0.61–1.24)
GFR, Estimated: 15 mL/min — ABNORMAL LOW (ref >60)
Glucose, Bld: 87 mg/dL (ref 70–99)
Potassium: 3.7 mmol/L (ref 3.5–5.1)
Sodium: 146 mmol/L — ABNORMAL HIGH (ref 135–145)

## 2021-10-20 LAB — CBC
HCT: 29.8 % — ABNORMAL LOW (ref 39.0–52.0)
Hemoglobin: 9.6 g/dL — ABNORMAL LOW (ref 13.0–17.0)
MCH: 27.6 pg (ref 26.0–34.0)
MCHC: 32.2 g/dL (ref 30.0–36.0)
MCV: 85.6 fL (ref 80.0–100.0)
Platelets: 157 10*3/uL (ref 150–400)
RBC: 3.48 MIL/uL — ABNORMAL LOW (ref 4.22–5.81)
RDW: 14.6 % (ref 11.5–15.5)
WBC: 4.3 10*3/uL (ref 4.0–10.5)
nRBC: 0 % (ref 0.0–0.2)

## 2021-10-20 LAB — GLUCOSE, CAPILLARY
Glucose-Capillary: 83 mg/dL (ref 70–99)
Glucose-Capillary: 88 mg/dL (ref 70–99)
Glucose-Capillary: 90 mg/dL (ref 70–99)
Glucose-Capillary: 91 mg/dL (ref 70–99)

## 2021-10-20 LAB — PHOSPHORUS: Phosphorus: 5.4 mg/dL — ABNORMAL HIGH (ref 2.5–4.6)

## 2021-10-20 LAB — MAGNESIUM: Magnesium: 2.8 mg/dL — ABNORMAL HIGH (ref 1.7–2.4)

## 2021-10-20 MED ORDER — HYDRALAZINE HCL 20 MG/ML IJ SOLN: 10.0000 mg | INTRAMUSCULAR | Status: DC | PRN

## 2021-10-20 MED ORDER — SENNOSIDES-DOCUSATE SODIUM 8.6-50 MG PO TABS: 1.0000 | ORAL_TABLET | Freq: Every evening | ORAL | Status: DC | PRN

## 2021-10-20 MED ORDER — TRAZODONE HCL 50 MG PO TABS
50.0000 mg | ORAL_TABLET | Freq: Every evening | ORAL | Status: DC | PRN
  Administered 2021-10-21: 21:00:00 50 mg via ORAL
  Filled 2021-10-20: qty 1

## 2021-10-20 MED ORDER — METOPROLOL TARTRATE 5 MG/5ML IV SOLN: 5.0000 mg | INTRAVENOUS | Status: DC | PRN

## 2021-10-20 NOTE — Progress Notes (Signed)
Subjective: Patient states he has been passing gas and has had small bowel movement.  States he is still mildly distended, though improved since his hospitalization.  Objective: Vital signs in last 24 hours: Temp:  [97.7 F (36.5 C)-98.7 F (37.1 C)] 98.4 F (36.9 C) (12/21 1320) Pulse Rate:  [76-84] 84 (12/21 1320) Resp:  [18-19] 19 (12/21 1320) BP: (130-161)/(75-89) 161/75 (12/21 1320) SpO2:  [90 %-98 %] 98 % (12/21 1320) Last BM Date: 10/19/21  Intake/Output from previous day: 12/20 0701 - 12/21 0700 In: 1085.2 [I.V.:1000; IV Piggyback:85.2] Out: 200 [Urine:200] Intake/Output this shift: Total I/O In: -  Out: 325 [Urine:325]  General appearance: alert, cooperative, and no distress Resp: clear to auscultation bilaterally Cardio: regular rate and rhythm, S1, S2 normal, no murmur, click, rub or gallop GI: Soft with mild distention noted.  Minimal bowel sounds appreciated.  Nontense.  Lab Results:  Recent Labs    10/19/21 2214 10/20/21 0537  WBC 4.8 4.3  HGB 9.8* 9.6*  HCT 30.7* 29.8*  PLT 159 157   BMET Recent Labs    10/19/21 0551 10/20/21 0537  NA 142 146*  K 3.7 3.7  CL 103 107  CO2 28 27  GLUCOSE 97 87  BUN 58* 70*  CREATININE 4.02* 3.91*  CALCIUM 8.2* 8.4*   PT/INR No results for input(s): LABPROT, INR in the last 72 hours.  Studies/Results: DG ABD ACUTE 2+V W 1V CHEST  Result Date: 10/20/2021 CLINICAL DATA:  Small-bowel obstruction due to adhesions, nasogastric tube, history chronic kidney disease, GERD, hypertension EXAM: DG ABDOMEN ACUTE WITH 1 VIEW CHEST COMPARISON:  10/19/2021 abdominal radiographs, chest radiograph 10/17/2021 FINDINGS: Nasogastric tube extends into stomach. Mild enlargement of cardiac silhouette. Mediastinal contours and pulmonary vascularity normal. Bronchitic changes with minimal bibasilar atelectasis. Lungs otherwise clear. No infiltrate, pleural effusion, or pneumothorax. Oral contrast within stomach and colon. Dilated  small bowel loops which could reflect partial obstruction or ileus. No bowel wall thickening or free air. Osseous structures unremarkable. IMPRESSION: Dilated small bowel loops question partial obstruction versus ileus, which GI contrast are present in the colon. Electronically Signed   By: Lavonia Dana M.D.   On: 10/20/2021 08:59   DG Abd Portable 1V-Small Bowel Obstruction Protocol-initial, 8 hr delay  Result Date: 10/19/2021 CLINICAL DATA:  Small-bowel obstruction 8 hour delay film, initial encounter EXAM: PORTABLE ABDOMEN - 1 VIEW COMPARISON:  Film from earlier in the same day. FINDINGS: Gastric catheter is again noted within the stomach. Endoscopic clip is noted in the upper abdomen. Persistent scattered small-bowel dilatation is noted. Previously administered contrast lies predominately within the colon at this time. IMPRESSION: Persistent small bowel dilatation consistent with a partial small bowel obstruction as the majority of contrast now lies within the colon. Electronically Signed   By: Inez Catalina M.D.   On: 10/19/2021 19:43   DG Abd Portable 1V-Small Bowel Protocol-Position Verification  Result Date: 10/19/2021 CLINICAL DATA:  NG tube placement, history of small-bowel obstruction. EXAM: PORTABLE ABDOMEN - 1 VIEW COMPARISON:  October 17, 2021. FINDINGS: A nasogastric tube has been placed, side port at or just above the level of the GE junction. There are numerous small bowel loops with dilation seen in the abdomen with similar distribution when compared to the previous CT evaluation. No abnormal calcification. On limited assessment no acute skeletal process. IMPRESSION: A nasogastric tube is been placed, side port at or just above the level of the GE junction. Approximately 3-4 cm advancement may be helpful for more optimal placement.  Signs of continued small bowel obstruction. Electronically Signed   By: Zetta Bills M.D.   On: 10/19/2021 08:35    Anti-infectives: Anti-infectives (From  admission, onward)    None       Assessment/Plan: Impression: Patient has a partial small bowel obstruction as there is contrast in the colon.  Patient does not need acute surgical invention at this time.  Patient does have chronic kidney disease and is being followed by the hospitalist.  Due to his slow bowel response, we will continue NG tube decompression overnight.  We will reassess in the morning.  LOS: 3 days    Aviva Signs 10/20/2021

## 2021-10-20 NOTE — Progress Notes (Signed)
PROGRESS NOTE    Andrew Adams  LSL:[id number] DOB: Jan 29, 1946 DOA: 10/17/2021 PCP: Lavella Lemons, PA   Brief Narrative:  75 y.o. male reformed smoker with past medical history relevant for mixed type COPD , OSA, CKD stage IV , HTN, GERD , prior history of abdominal surgeries and chronic anemia of CKD who was admitted on 10/17/2021 with high-grade SBO.  Medical team was consulted for acute kidney injury.   Assessment & Plan:   Principal Problem:   Small bowel obstruction due to adhesions [facility]) Active Problems:   Essential hypertension   COPD (chronic obstructive pulmonary disease) (HCC)   CKD (chronic kidney disease), stage IV (HCC)   History of anemia due to CKD  Acute kidney injury on CKD IV -Baseline creatinine 2.4.  Admission creatinine 4.0.  Did not receive any contrast or other nephrotoxic drugs.  We will plan on IV fluids, monitor urine output - Periodic bladder scan, In-N-Out catheter if necessary  High Grade SBO- presumably  from adhesions  -Management per general surgery.  NG tube in place   COPD/OSA--unable to use CPAP/BiPAP due to continuous NG suction -As needed bronchodilators   Essential hypertension - Continue Norvasc, Coreg.  IV hydralazine/Lopressor as needed   GERD - PPI   HFpEF--history of chronic diastolic dysfunction CHF, EF was 55 to 60% per echo from 10/30/2019 with grade 1 diastolic dysfunction -Currently Lasix on hold.  Getting gentle hydration.   Chronic anemia of CKD  -H&H may drop with hydration, monitor closely and transfuse as needed, consider ESA/Procrit as indicated     DVT prophylaxis: enoxaparin (LOVENOX) injection 30 mg Start: 10/19/21 1000 SCDs Start: 10/17/21 2137 Code Status: Full Family Communication:    Subjective: Sitting up does not have any complaints.  Tolerating NG tube.  Review of Systems Otherwise negative except as per HPI, including: General: Denies fever, chills, night sweats or unintended weight  loss. Resp: Denies cough, wheezing, shortness of breath. Cardiac: Denies chest pain, palpitations, orthopnea, paroxysmal nocturnal dyspnea. GI: Denies abdominal pain, nausea, vomiting, diarrhea or constipation GU: Denies dysuria, frequency, hesitancy or incontinence MS: Denies muscle aches, joint pain or swelling Neuro: Denies headache, neurologic deficits (focal weakness, numbness, tingling), abnormal gait Psych: Denies anxiety, depression, SI/HI/AVH Skin: Denies new rashes or lesions ID: Denies sick contacts, exotic exposures, travel  Examination:  General exam: Appears calm and comfortable  Respiratory system: Clear to auscultation. Respiratory effort normal. Cardiovascular system: S1 & S2 heard, RRR. No JVD, murmurs, rubs, gallops or clicks. No pedal edema. Gastrointestinal system: Abdomen is nondistended, soft and nontender. No organomegaly or masses felt. Normal bowel sounds heard. Central nervous system: Alert and oriented. No focal neurological deficits. Extremities: Symmetric 5 x 5 power. Skin: No rashes, lesions or ulcers Psychiatry: Judgement and insight appear normal. Mood & affect appropriate.   NG tube in place  Objective: Vitals:   10/19/21 1232 10/19/21 1700 10/19/21 2050 10/20/21 0524  BP: 124/79 139/89 130/81 137/78  Pulse: 75 76 83 80  Resp: 20 18 18 19   Temp: 98.2 F (36.8 C) 98 F (36.7 C) 98.7 F (37.1 C) 97.7 F (36.5 C)  TempSrc: Oral Oral  Oral  SpO2: 91% 91% 95% 90%  Weight:      Height:        Intake/Output Summary (Last 24 hours) at 10/20/2021 0908 Last data filed at 10/20/2021 0531 Gross per 24 hour  Intake 1085.21 ml  Output 200 ml  Net 885.21 ml   Filed Weights   10/17/21 2134  Weight: 96.6 kg     Data Reviewed:   CBC: Recent Labs  Lab 10/17/21 1632 10/18/21 0354 10/19/21 0551 10/19/21 2214 10/20/21 0537  WBC 8.2 7.4 5.4 4.8 4.3  NEUTROABS 6.4  --   --   --   --   HGB 11.7* 10.5* 9.8* 9.8* 9.6*  HCT 36.1* 32.1* 31.1*  30.7* 29.8*  MCV 83.4 84.3 85.2 85.5 85.6  PLT 198 188 166 159 127   Basic Metabolic Panel: Recent Labs  Lab 10/17/21 1632 10/18/21 0354 10/19/21 0551 10/20/21 0537  NA 141 141 142 146*  K 3.8 4.2 3.7 3.7  CL 99 100 103 107  CO2 32 29 28 27   GLUCOSE 143* 114* 97 87  BUN 28* 39* 58* 70*  CREATININE 2.39* 3.16* 4.02* 3.91*  CALCIUM 9.1 8.4* 8.2* 8.4*  MG  --  2.3 2.6* 2.8*  PHOS  --  6.6* 5.4* 5.4*   GFR: Estimated Creatinine Clearance: 18.1 mL/min (A) (by C-G formula based on SCr of 3.91 mg/dL (H)). Liver Function Tests: Recent Labs  Lab 10/17/21 1632  AST 21  ALT 17  ALKPHOS 70  BILITOT 0.9  PROT 7.5  ALBUMIN 4.1   No results for input(s): LIPASE, AMYLASE in the last 168 hours. No results for input(s): AMMONIA in the last 168 hours. Coagulation Profile: No results for input(s): INR, PROTIME in the last 168 hours. Cardiac Enzymes: No results for input(s): CKTOTAL, CKMB, CKMBINDEX, TROPONINI in the last 168 hours. BNP (last 3 results) No results for input(s): PROBNP in the last 8760 hours. HbA1C: Recent Labs    10/17/21 1632  HGBA1C 6.3*   CBG: Recent Labs  Lab 10/18/21 2313 10/19/21 0705 10/19/21 1057 10/20/21 0005 10/20/21 0541  GLUCAP 84 89 89 91 83   Lipid Profile: No results for input(s): CHOL, HDL, LDLCALC, TRIG, CHOLHDL, LDLDIRECT in the last 72 hours. Thyroid Function Tests: No results for input(s): TSH, T4TOTAL, FREET4, T3FREE, THYROIDAB in the last 72 hours. Anemia Panel: No results for input(s): VITAMINB12, FOLATE, FERRITIN, TIBC, IRON, RETICCTPCT in the last 72 hours. Sepsis Labs: No results for input(s): PROCALCITON, LATICACIDVEN in the last 168 hours.  Recent Results (from the past 240 hour(s))  Resp Panel by RT-PCR (Flu A&B, Covid) Nasopharyngeal Swab     Status: None   Collection Time: 10/17/21  4:30 PM   Specimen: Nasopharyngeal Swab; Nasopharyngeal(NP) swabs in vial transport medium  Result Value Ref Range Status   SARS  Coronavirus 2 by RT PCR NEGATIVE NEGATIVE Final    Comment: (NOTE) SARS-CoV-2 target nucleic acids are NOT DETECTED.  The SARS-CoV-2 RNA is generally detectable in upper respiratory specimens during the acute phase of infection. The lowest concentration of SARS-CoV-2 viral copies this assay can detect is 138 copies/mL. A negative result does not preclude SARS-Cov-2 infection and should not be used as the sole basis for treatment or other patient management decisions. A negative result may occur with  improper specimen collection/handling, submission of specimen other than nasopharyngeal swab, presence of viral mutation(s) within the areas targeted by this assay, and inadequate number of viral copies(<138 copies/mL). A negative result must be combined with clinical observations, patient history, and epidemiological information. The expected result is Negative.  Fact Sheet for Patients:  EntrepreneurPulse.com.au  Fact Sheet for Healthcare Providers:  IncredibleEmployment.be  This test is no t yet approved or cleared by the [place] FDA and  has been authorized for detection and/or diagnosis of SARS-CoV-2 by FDA under an Emergency Use Authorization (  EUA). This EUA will remain  in effect (meaning this test can be used) for the duration of the COVID-19 declaration under Section 564(b)(1) of the Act, 21 U.S.C.section 360bbb-3(b)(1), unless the authorization is terminated  or revoked sooner.       Influenza A by PCR NEGATIVE NEGATIVE Final   Influenza B by PCR NEGATIVE NEGATIVE Final    Comment: (NOTE) The Xpert Xpress SARS-CoV-2/FLU/RSV plus assay is intended as an aid in the diagnosis of influenza from Nasopharyngeal swab specimens and should not be used as a sole basis for treatment. Nasal washings and aspirates are unacceptable for Xpert Xpress SARS-CoV-2/FLU/RSV testing.  Fact Sheet for  Patients: EntrepreneurPulse.com.au  Fact Sheet for Healthcare Providers: IncredibleEmployment.be  This test is not yet approved or cleared by the [place] FDA and has been authorized for detection and/or diagnosis of SARS-CoV-2 by FDA under an Emergency Use Authorization (EUA). This EUA will remain in effect (meaning this test can be used) for the duration of the COVID-19 declaration under Section 564(b)(1) of the Act, 21 U.S.C. section 360bbb-3(b)(1), unless the authorization is terminated or revoked.  Performed at [facility], [address], [place], [place] [address]          Radiology Studies: DG ABD ACUTE 2+V W 1V CHEST  Result Date: 10/20/2021 CLINICAL DATA:  Small-bowel obstruction due to adhesions, nasogastric tube, history chronic kidney disease, GERD, hypertension EXAM: DG ABDOMEN ACUTE WITH 1 VIEW CHEST COMPARISON:  10/19/2021 abdominal radiographs, chest radiograph 10/17/2021 FINDINGS: Nasogastric tube extends into stomach. Mild enlargement of cardiac silhouette. Mediastinal contours and pulmonary vascularity normal. Bronchitic changes with minimal bibasilar atelectasis. Lungs otherwise clear. No infiltrate, pleural effusion, or pneumothorax. Oral contrast within stomach and colon. Dilated small bowel loops which could reflect partial obstruction or ileus. No bowel wall thickening or free air. Osseous structures unremarkable. IMPRESSION: Dilated small bowel loops question partial obstruction versus ileus, which GI contrast are present in the colon. Electronically Signed   By: Lavonia Dana M.D.   On: 10/20/2021 08:59   DG Abd Portable 1V-Small Bowel Obstruction Protocol-initial, 8 hr delay  Result Date: 10/19/2021 CLINICAL DATA:  Small-bowel obstruction 8 hour delay film, initial encounter EXAM: PORTABLE ABDOMEN - 1 VIEW COMPARISON:  Film from earlier in the same day. FINDINGS: Gastric catheter is again noted within the stomach.  Endoscopic clip is noted in the upper abdomen. Persistent scattered small-bowel dilatation is noted. Previously administered contrast lies predominately within the colon at this time. IMPRESSION: Persistent small bowel dilatation consistent with a partial small bowel obstruction as the majority of contrast now lies within the colon. Electronically Signed   By: Inez Catalina M.D.   On: 10/19/2021 19:43   DG Abd Portable 1V-Small Bowel Protocol-Position Verification  Result Date: 10/19/2021 CLINICAL DATA:  NG tube placement, history of small-bowel obstruction. EXAM: PORTABLE ABDOMEN - 1 VIEW COMPARISON:  October 17, 2021. FINDINGS: A nasogastric tube has been placed, side port at or just above the level of the GE junction. There are numerous small bowel loops with dilation seen in the abdomen with similar distribution when compared to the previous CT evaluation. No abnormal calcification. On limited assessment no acute skeletal process. IMPRESSION: A nasogastric tube is been placed, side port at or just above the level of the GE junction. Approximately 3-4 cm advancement may be helpful for more optimal placement. Signs of continued small bowel obstruction. Electronically Signed   By: Zetta Bills M.D.   On: 10/19/2021 08:35  Scheduled Meds:  allopurinol  100 mg Oral Daily   amLODipine  10 mg Oral Daily   atorvastatin  40 mg Oral q1800   carvedilol  12.5 mg Oral BID WC   enoxaparin (LOVENOX) injection  30 mg Subcutaneous Q24H   insulin aspart  0-6 Units Subcutaneous TID AC   pantoprazole (PROTONIX) IV  40 mg Intravenous Q24H   senna-docusate  2 tablet Oral QHS   Continuous Infusions:  sodium chloride 125 mL/hr at 10/19/21 1944     LOS: 3 days   Time spent= 35 mins    Shelsey Rieth Arsenio Loader, MD [facility]  If 7PM-7AM, please contact night-coverage  10/20/2021, 9:08 AM

## 2021-10-21 LAB — BASIC METABOLIC PANEL
Anion gap: 10 (ref 5–15)
BUN: 70 mg/dL — ABNORMAL HIGH (ref 8–23)
CO2: 24 mmol/L (ref 22–32)
Calcium: 8.7 mg/dL — ABNORMAL LOW (ref 8.9–10.3)
Chloride: 115 mmol/L — ABNORMAL HIGH (ref 98–111)
Creatinine, Ser: 3.28 mg/dL — ABNORMAL HIGH (ref 0.61–1.24)
GFR, Estimated: 19 mL/min — ABNORMAL LOW (ref >60)
Glucose, Bld: 94 mg/dL (ref 70–99)
Potassium: 3.7 mmol/L (ref 3.5–5.1)
Sodium: 149 mmol/L — ABNORMAL HIGH (ref 135–145)

## 2021-10-21 LAB — CBC
HCT: 31 % — ABNORMAL LOW (ref 39.0–52.0)
Hemoglobin: 9.8 g/dL — ABNORMAL LOW (ref 13.0–17.0)
MCH: 26.7 pg (ref 26.0–34.0)
MCHC: 31.6 g/dL (ref 30.0–36.0)
MCV: 84.5 fL (ref 80.0–100.0)
Platelets: 174 10*3/uL (ref 150–400)
RBC: 3.67 MIL/uL — ABNORMAL LOW (ref 4.22–5.81)
RDW: 14.4 % (ref 11.5–15.5)
WBC: 3.6 10*3/uL — ABNORMAL LOW (ref 4.0–10.5)
nRBC: 0 % (ref 0.0–0.2)

## 2021-10-21 LAB — GLUCOSE, CAPILLARY
Glucose-Capillary: 100 mg/dL — ABNORMAL HIGH (ref 70–99)
Glucose-Capillary: 135 mg/dL — ABNORMAL HIGH (ref 70–99)
Glucose-Capillary: 140 mg/dL — ABNORMAL HIGH (ref 70–99)
Glucose-Capillary: 150 mg/dL — ABNORMAL HIGH (ref 70–99)
Glucose-Capillary: 89 mg/dL (ref 70–99)
Glucose-Capillary: 94 mg/dL (ref 70–99)

## 2021-10-21 LAB — MAGNESIUM: Magnesium: 2.5 mg/dL — ABNORMAL HIGH (ref 1.7–2.4)

## 2021-10-21 LAB — PHOSPHORUS: Phosphorus: 4.3 mg/dL (ref 2.5–4.6)

## 2021-10-21 MED ORDER — POLYETHYLENE GLYCOL 3350 17 G PO PACK
17.0000 g | PACK | Freq: Every day | ORAL | Status: DC
  Administered 2021-10-21 – 2021-10-23 (×2): 17 g via ORAL
  Filled 2021-10-21 (×3): qty 1

## 2021-10-21 MED ORDER — ALUM & MAG HYDROXIDE-SIMETH 200-200-20 MG/5ML PO SUSP
30.0000 mL | ORAL | Status: DC | PRN
  Administered 2021-10-21 – 2021-10-22 (×2): 30 mL via ORAL
  Filled 2021-10-21 (×2): qty 30

## 2021-10-21 MED ORDER — DEXTROSE-NACL 5-0.45 % IV SOLN: INTRAVENOUS | Status: DC

## 2021-10-21 MED ORDER — SODIUM CHLORIDE 0.45 % IV SOLN: INTRAVENOUS | Status: DC

## 2021-10-21 NOTE — Care Management Important Message (Signed)
Important Message  Patient Details  Name: Andrew Adams MRN: [id number] Date of Birth: 1946/01/10   Medicare Important Message Given:  Yes     Tommy Medal 10/21/2021, 11:38 AM

## 2021-10-21 NOTE — Progress Notes (Signed)
°  Subjective: Patient has had multiple bowel movements over the last 24 hours.  His NG tube output has decreased.  Patient denies any significant abdominal pain.  Objective: Vital signs in last 24 hours: Temp:  [98.1 F (36.7 C)-98.4 F (36.9 C)] 98.3 F (36.8 C) (12/22 0602) Pulse Rate:  [75-84] 75 (12/22 0602) Resp:  [19-20] 20 (12/22 0602) BP: (144-161)/(75-84) 144/84 (12/22 0602) SpO2:  [93 %-98 %] 98 % (12/22 0602) Last BM Date: 10/21/21  Intake/Output from previous day: 12/21 0701 - 12/22 0700 In: 1375 [I.V.:1375] Out: 2575 [Urine:2175; Emesis/NG output:400] Intake/Output this shift: No intake/output data recorded.  General appearance: alert, cooperative, and no distress Resp: clear to auscultation bilaterally Cardio: regular rate and rhythm, S1, S2 normal, no murmur, click, rub or gallop GI: Soft, mildly distended.  Active bowel sounds appreciated.  Lab Results:  Recent Labs    10/20/21 0537 10/21/21 0527  WBC 4.3 3.6*  HGB 9.6* 9.8*  HCT 29.8* 31.0*  PLT 157 174   BMET Recent Labs    10/20/21 0537 10/21/21 0527  NA 146* 149*  K 3.7 3.7  CL 107 115*  CO2 27 24  GLUCOSE 87 94  BUN 70* 70*  CREATININE 3.91* 3.28*  CALCIUM 8.4* 8.7*   PT/INR No results for input(s): LABPROT, INR in the last 72 hours.  Studies/Results: DG ABD ACUTE 2+V W 1V CHEST  Result Date: 10/20/2021 CLINICAL DATA:  Small-bowel obstruction due to adhesions, nasogastric tube, history chronic kidney disease, GERD, hypertension EXAM: DG ABDOMEN ACUTE WITH 1 VIEW CHEST COMPARISON:  10/19/2021 abdominal radiographs, chest radiograph 10/17/2021 FINDINGS: Nasogastric tube extends into stomach. Mild enlargement of cardiac silhouette. Mediastinal contours and pulmonary vascularity normal. Bronchitic changes with minimal bibasilar atelectasis. Lungs otherwise clear. No infiltrate, pleural effusion, or pneumothorax. Oral contrast within stomach and colon. Dilated small bowel loops which could  reflect partial obstruction or ileus. No bowel wall thickening or free air. Osseous structures unremarkable. IMPRESSION: Dilated small bowel loops question partial obstruction versus ileus, which GI contrast are present in the colon. Electronically Signed   By: Lavonia Dana M.D.   On: 10/20/2021 08:59   DG Abd Portable 1V-Small Bowel Obstruction Protocol-initial, 8 hr delay  Result Date: 10/19/2021 CLINICAL DATA:  Small-bowel obstruction 8 hour delay film, initial encounter EXAM: PORTABLE ABDOMEN - 1 VIEW COMPARISON:  Film from earlier in the same day. FINDINGS: Gastric catheter is again noted within the stomach. Endoscopic clip is noted in the upper abdomen. Persistent scattered small-bowel dilatation is noted. Previously administered contrast lies predominately within the colon at this time. IMPRESSION: Persistent small bowel dilatation consistent with a partial small bowel obstruction as the majority of contrast now lies within the colon. Electronically Signed   By: Inez Catalina M.D.   On: 10/19/2021 19:43    Anti-infectives: Anti-infectives (From admission, onward)    None       Assessment/Plan: Impression: Partial small bowel obstruction resolving.  Creatinine has decreased today. Plan: I have removed his NG tube.  We will start full liquid diet.  Continue to monitor renal function.  Appreciate hospitalist input.  LOS: 4 days    Aviva Signs 10/21/2021

## 2021-10-21 NOTE — Progress Notes (Signed)
PROGRESS NOTE    Andrew Adams  GMW:[id number] DOB: 12-Jul-1946 DOA: 10/17/2021 PCP: Lavella Lemons, PA   Brief Narrative:  75 y.o. male reformed smoker with past medical history relevant for mixed type COPD , OSA, CKD stage IV , HTN, GERD , prior history of abdominal surgeries and chronic anemia of CKD who was admitted on 10/17/2021 with high-grade SBO.  Medical team was consulted for acute kidney injury.   Assessment & Plan:   Principal Problem:   Small bowel obstruction due to adhesions [facility]) Active Problems:   Essential hypertension   COPD (chronic obstructive pulmonary disease) (HCC)   CKD (chronic kidney disease), stage IV (HCC)   History of anemia due to CKD  Acute kidney injury on CKD IV -Baseline creatinine 2.4.  Admission creatinine 4.0.  Improving with IVF fluids 3.91 > 3.28. Changed fluids to D5 1/2 due to rise in Na.  - Periodic bladder scan, In-N-Out catheter if necessary  High Grade SBO- presumably  from adhesions  -Management per general surgery.  NG tube in place   COPD/OSA--unable to use CPAP/BiPAP due to continuous NG suction -As needed bronchodilators   Essential hypertension - Continue Norvasc, Coreg.  IV hydralazine/Lopressor as needed   GERD - PPI   HFpEF--history of chronic diastolic dysfunction CHF, EF was 55 to 60% per echo from 10/30/2019 with grade 1 diastolic dysfunction -Currently Lasix on hold.  Getting gentle hydration.   Chronic anemia of CKD  -H&H may drop with hydration, monitor closely and transfuse as needed, consider ESA/Procrit as indicated     DVT prophylaxis: enoxaparin (LOVENOX) injection 30 mg Start: 10/19/21 1000 SCDs Start: 10/17/21 2137 Code Status: Full Family Communication:    Subjective: NGT out this morning, doing ok.    Examination: Constitutional: Not in acute distress Respiratory: Clear to auscultation bilaterally Cardiovascular: Normal sinus rhythm, no rubs Abdomen: Nontender nondistended good bowel  sounds Musculoskeletal: No edema noted Skin: No rashes seen Neurologic: CN 2-12 grossly intact.  And nonfocal Psychiatric: Normal judgment and insight. Alert and oriented x 3. Normal mood.   Objective: Vitals:   10/20/21 0524 10/20/21 1320 10/20/21 2124 10/21/21 0602  BP: 137/78 (!) 161/75 (!) 144/79 (!) 144/84  Pulse: 80 84 80 75  Resp: 19 19 20 20   Temp: 97.7 F (36.5 C) 98.4 F (36.9 C) 98.1 F (36.7 C) 98.3 F (36.8 C)  TempSrc: Oral Oral  Oral  SpO2: 90% 98% 93% 98%  Weight:      Height:        Intake/Output Summary (Last 24 hours) at 10/21/2021 0828 Last data filed at 10/21/2021 0603 Gross per 24 hour  Intake 1375 ml  Output 2575 ml  Net -1200 ml   Filed Weights   10/17/21 2134  Weight: 96.6 kg     Data Reviewed:   CBC: Recent Labs  Lab 10/17/21 1632 10/18/21 0354 10/19/21 0551 10/19/21 2214 10/20/21 0537 10/21/21 0527  WBC 8.2 7.4 5.4 4.8 4.3 3.6*  NEUTROABS 6.4  --   --   --   --   --   HGB 11.7* 10.5* 9.8* 9.8* 9.6* 9.8*  HCT 36.1* 32.1* 31.1* 30.7* 29.8* 31.0*  MCV 83.4 84.3 85.2 85.5 85.6 84.5  PLT 198 188 166 159 157 440   Basic Metabolic Panel: Recent Labs  Lab 10/17/21 1632 10/18/21 0354 10/19/21 0551 10/20/21 0537 10/21/21 0527  NA 141 141 142 146* 149*  K 3.8 4.2 3.7 3.7 3.7  CL 99 100 103 107 115*  CO2 32 29 28 27 24   GLUCOSE 143* 114* 97 87 94  BUN 28* 39* 58* 70* 70*  CREATININE 2.39* 3.16* 4.02* 3.91* 3.28*  CALCIUM 9.1 8.4* 8.2* 8.4* 8.7*  MG  --  2.3 2.6* 2.8* 2.5*  PHOS  --  6.6* 5.4* 5.4* 4.3   GFR: Estimated Creatinine Clearance: 21.6 mL/min (A) (by C-G formula based on SCr of 3.28 mg/dL (H)). Liver Function Tests: Recent Labs  Lab 10/17/21 1632  AST 21  ALT 17  ALKPHOS 70  BILITOT 0.9  PROT 7.5  ALBUMIN 4.1   No results for input(s): LIPASE, AMYLASE in the last 168 hours. No results for input(s): AMMONIA in the last 168 hours. Coagulation Profile: No results for input(s): INR, PROTIME in the last 168  hours. Cardiac Enzymes: No results for input(s): CKTOTAL, CKMB, CKMBINDEX, TROPONINI in the last 168 hours. BNP (last 3 results) No results for input(s): PROBNP in the last 8760 hours. HbA1C: No results for input(s): HGBA1C in the last 72 hours.  CBG: Recent Labs  Lab 10/20/21 1111 10/20/21 1634 10/21/21 0013 10/21/21 0603 10/21/21 0726  GLUCAP 90 88 100* 94 89   Lipid Profile: No results for input(s): CHOL, HDL, LDLCALC, TRIG, CHOLHDL, LDLDIRECT in the last 72 hours. Thyroid Function Tests: No results for input(s): TSH, T4TOTAL, FREET4, T3FREE, THYROIDAB in the last 72 hours. Anemia Panel: No results for input(s): VITAMINB12, FOLATE, FERRITIN, TIBC, IRON, RETICCTPCT in the last 72 hours. Sepsis Labs: No results for input(s): PROCALCITON, LATICACIDVEN in the last 168 hours.  Recent Results (from the past 240 hour(s))  Resp Panel by RT-PCR (Flu A&B, Covid) Nasopharyngeal Swab     Status: None   Collection Time: 10/17/21  4:30 PM   Specimen: Nasopharyngeal Swab; Nasopharyngeal(NP) swabs in vial transport medium  Result Value Ref Range Status   SARS Coronavirus 2 by RT PCR NEGATIVE NEGATIVE Final    Comment: (NOTE) SARS-CoV-2 target nucleic acids are NOT DETECTED.  The SARS-CoV-2 RNA is generally detectable in upper respiratory specimens during the acute phase of infection. The lowest concentration of SARS-CoV-2 viral copies this assay can detect is 138 copies/mL. A negative result does not preclude SARS-Cov-2 infection and should not be used as the sole basis for treatment or other patient management decisions. A negative result may occur with  improper specimen collection/handling, submission of specimen other than nasopharyngeal swab, presence of viral mutation(s) within the areas targeted by this assay, and inadequate number of viral copies(<138 copies/mL). A negative result must be combined with clinical observations, patient history, and epidemiological information.  The expected result is Negative.  Fact Sheet for Patients:  EntrepreneurPulse.com.au  Fact Sheet for Healthcare Providers:  IncredibleEmployment.be  This test is no t yet approved or cleared by the [place] FDA and  has been authorized for detection and/or diagnosis of SARS-CoV-2 by FDA under an Emergency Use Authorization (EUA). This EUA will remain  in effect (meaning this test can be used) for the duration of the COVID-19 declaration under Section 564(b)(1) of the Act, 21 U.S.C.section 360bbb-3(b)(1), unless the authorization is terminated  or revoked sooner.       Influenza A by PCR NEGATIVE NEGATIVE Final   Influenza B by PCR NEGATIVE NEGATIVE Final    Comment: (NOTE) The Xpert Xpress SARS-CoV-2/FLU/RSV plus assay is intended as an aid in the diagnosis of influenza from Nasopharyngeal swab specimens and should not be used as a sole basis for treatment. Nasal washings and aspirates are unacceptable for Xpert Xpress  SARS-CoV-2/FLU/RSV testing.  Fact Sheet for Patients: EntrepreneurPulse.com.au  Fact Sheet for Healthcare Providers: IncredibleEmployment.be  This test is not yet approved or cleared by the [place] FDA and has been authorized for detection and/or diagnosis of SARS-CoV-2 by FDA under an Emergency Use Authorization (EUA). This EUA will remain in effect (meaning this test can be used) for the duration of the COVID-19 declaration under Section 564(b)(1) of the Act, 21 U.S.C. section 360bbb-3(b)(1), unless the authorization is terminated or revoked.  Performed at [facility], [address], [place], [place] [address]          Radiology Studies: DG ABD ACUTE 2+V W 1V CHEST  Result Date: 10/20/2021 CLINICAL DATA:  Small-bowel obstruction due to adhesions, nasogastric tube, history chronic kidney disease, GERD, hypertension EXAM: DG ABDOMEN ACUTE WITH 1 VIEW CHEST COMPARISON:   10/19/2021 abdominal radiographs, chest radiograph 10/17/2021 FINDINGS: Nasogastric tube extends into stomach. Mild enlargement of cardiac silhouette. Mediastinal contours and pulmonary vascularity normal. Bronchitic changes with minimal bibasilar atelectasis. Lungs otherwise clear. No infiltrate, pleural effusion, or pneumothorax. Oral contrast within stomach and colon. Dilated small bowel loops which could reflect partial obstruction or ileus. No bowel wall thickening or free air. Osseous structures unremarkable. IMPRESSION: Dilated small bowel loops question partial obstruction versus ileus, which GI contrast are present in the colon. Electronically Signed   By: Lavonia Dana M.D.   On: 10/20/2021 08:59   DG Abd Portable 1V-Small Bowel Obstruction Protocol-initial, 8 hr delay  Result Date: 10/19/2021 CLINICAL DATA:  Small-bowel obstruction 8 hour delay film, initial encounter EXAM: PORTABLE ABDOMEN - 1 VIEW COMPARISON:  Film from earlier in the same day. FINDINGS: Gastric catheter is again noted within the stomach. Endoscopic clip is noted in the upper abdomen. Persistent scattered small-bowel dilatation is noted. Previously administered contrast lies predominately within the colon at this time. IMPRESSION: Persistent small bowel dilatation consistent with a partial small bowel obstruction as the majority of contrast now lies within the colon. Electronically Signed   By: Inez Catalina M.D.   On: 10/19/2021 19:43        Scheduled Meds:  allopurinol  100 mg Oral Daily   amLODipine  10 mg Oral Daily   atorvastatin  40 mg Oral q1800   carvedilol  12.5 mg Oral BID WC   enoxaparin (LOVENOX) injection  30 mg Subcutaneous Q24H   insulin aspart  0-6 Units Subcutaneous TID AC   pantoprazole (PROTONIX) IV  40 mg Intravenous Q24H   senna-docusate  2 tablet Oral QHS   Continuous Infusions:  sodium chloride     sodium chloride 125 mL/hr at 10/21/21 0602   dextrose 5 % and 0.45% NaCl       LOS: 4 days    Time spent= 35 mins    Shakhia Gramajo Arsenio Loader, MD [facility]  If 7PM-7AM, please contact night-coverage  10/21/2021, 8:28 AM

## 2021-10-21 NOTE — Progress Notes (Signed)
Stated had a good bm yesterday (Tues) afternoon.  Denies nausea.  Has been voiding and has voided 800 so far on night shift.  After last void of 200 bladder scan showed 225 but patient voiding in urinal while lying down. NG tube still in place and emptied 1000 but is taking in a lot of ice chips and output is watery

## 2021-10-21 NOTE — Progress Notes (Addendum)
NG tube tape reinforced.  400 ml more emptied. Greenish-brown.  Reported another bm for a total of 3 so far

## 2021-10-22 LAB — CBC
HCT: 29.1 % — ABNORMAL LOW (ref 39.0–52.0)
Hemoglobin: 9.1 g/dL — ABNORMAL LOW (ref 13.0–17.0)
MCH: 27.1 pg (ref 26.0–34.0)
MCHC: 31.3 g/dL (ref 30.0–36.0)
MCV: 86.6 fL (ref 80.0–100.0)
Platelets: 156 10*3/uL (ref 150–400)
RBC: 3.36 MIL/uL — ABNORMAL LOW (ref 4.22–5.81)
RDW: 14.2 % (ref 11.5–15.5)
WBC: 4.8 10*3/uL (ref 4.0–10.5)
nRBC: 0 % (ref 0.0–0.2)

## 2021-10-22 LAB — BASIC METABOLIC PANEL
Anion gap: 9 (ref 5–15)
BUN: 61 mg/dL — ABNORMAL HIGH (ref 8–23)
CO2: 25 mmol/L (ref 22–32)
Calcium: 8.5 mg/dL — ABNORMAL LOW (ref 8.9–10.3)
Chloride: 109 mmol/L (ref 98–111)
Creatinine, Ser: 2.91 mg/dL — ABNORMAL HIGH (ref 0.61–1.24)
GFR, Estimated: 22 mL/min — ABNORMAL LOW (ref >60)
Glucose, Bld: 133 mg/dL — ABNORMAL HIGH (ref 70–99)
Potassium: 3.7 mmol/L (ref 3.5–5.1)
Sodium: 143 mmol/L (ref 135–145)

## 2021-10-22 LAB — GLUCOSE, CAPILLARY
Glucose-Capillary: 127 mg/dL — ABNORMAL HIGH (ref 70–99)
Glucose-Capillary: 107 mg/dL — ABNORMAL HIGH (ref 70–99)
Glucose-Capillary: 138 mg/dL — ABNORMAL HIGH (ref 70–99)
Glucose-Capillary: 132 mg/dL — ABNORMAL HIGH (ref 70–99)
Glucose-Capillary: 109 mg/dL — ABNORMAL HIGH (ref 70–99)

## 2021-10-22 LAB — MAGNESIUM: Magnesium: 2.3 mg/dL (ref 1.7–2.4)

## 2021-10-22 LAB — PHOSPHORUS: Phosphorus: 3.8 mg/dL (ref 2.5–4.6)

## 2021-10-22 MED ORDER — PANTOPRAZOLE SODIUM 40 MG PO TBEC
40.0000 mg | DELAYED_RELEASE_TABLET | Freq: Every day | ORAL | Status: DC
  Administered 2021-10-22: 13:00:00 40 mg via ORAL
  Filled 2021-10-22: qty 1

## 2021-10-22 MED ORDER — SIMETHICONE 80 MG PO CHEW: 80.0000 mg | CHEWABLE_TABLET | Freq: Four times a day (QID) | ORAL | Status: DC | PRN

## 2021-10-22 NOTE — Progress Notes (Signed)
Patient had emesis x 1, yellow liquid in color. Patient given Zofran. Vital signs: T-98.0, P-72, R-20, BP-139/89, O2-96% at Room air. MD Delorise Shiner aware.

## 2021-10-22 NOTE — Care Management Important Message (Signed)
Important Message  Patient Details  Name: Andrew Adams MRN: [id number] Date of Birth: May 30, 1946   Medicare Important Message Given:  Yes     Tommy Medal 10/22/2021, 12:54 PM

## 2021-10-22 NOTE — Progress Notes (Signed)
PROGRESS NOTE    Andrew Adams  EHU:[id number] DOB: 10-05-1946 DOA: 10/17/2021 PCP: Lavella Lemons, PA   Brief Narrative:  75 y.o. male reformed smoker with past medical history relevant for mixed type COPD , OSA, CKD stage IV , HTN, GERD , prior history of abdominal surgeries and chronic anemia of CKD who was admitted on 10/17/2021 with high-grade SBO.  Medical team was consulted for acute kidney injury.  Renal function has been improving with fluids.   Assessment & Plan:   Principal Problem:   Small bowel obstruction due to adhesions [facility]) Active Problems:   Essential hypertension   COPD (chronic obstructive pulmonary disease) (HCC)   CKD (chronic kidney disease), stage IV (HCC)   History of anemia due to CKD  Acute kidney injury on CKD IV -Baseline creatinine 2.4.  Admission creatinine 4.0.  Improving with IVF fluids 3.91 > 3.28 > 2.91.  Continue D5 half-normal saline while here. Ok to resume outptn lasix in next 3-5 days with close monitoring by her outptn PCP and Nephrologist.  - Periodic bladder scan, In-N-Out catheter if necessary  High Grade SBO- presumably  from adhesions  -Management per general surgery.  NG tube in place   COPD/OSA--unable to use CPAP/BiPAP due to continuous NG suction -As needed bronchodilators   Essential hypertension - Continue Norvasc, Coreg.  IV hydralazine/Lopressor as needed   GERD - PPI   HFpEF--history of chronic diastolic dysfunction CHF, EF was 55 to 60% per echo from 10/30/2019 with grade 1 diastolic dysfunction -Currently Lasix on hold.  Getting gentle hydration.   Chronic anemia of CKD  -H&H may drop with hydration, monitor closely and transfuse as needed, consider ESA/Procrit as indicated outpatient.   Overall medically stable from medicine standpoint.  Further disposition per surgery team.  DVT prophylaxis: enoxaparin (LOVENOX) injection 30 mg Start: 10/19/21 1000 SCDs Start: 10/17/21 2137 Code Status: Full Family  Communication:    Subjective: Doing ok no complaints.    Examination: Constitutional: Not in acute distress Respiratory: Clear to auscultation bilaterally Cardiovascular: Normal sinus rhythm, no rubs Abdomen: Nontender nondistended good bowel sounds Musculoskeletal: No edema noted Skin: No rashes seen Neurologic: CN 2-12 grossly intact.  And nonfocal Psychiatric: Normal judgment and insight. Alert and oriented x 3. Normal mood.   Objective: Vitals:   10/21/21 2049 10/22/21 0113 10/22/21 0531 10/22/21 0533  BP: (!) 148/86  138/77   Pulse: 76  69 70  Resp: 18  19   Temp: 98.2 F (36.8 C)  99 F (37.2 C)   TempSrc: Oral     SpO2: 93% 90% (!) 86% 94%  Weight:      Height:        Intake/Output Summary (Last 24 hours) at 10/22/2021 0811 Last data filed at 10/21/2021 1700 Gross per 24 hour  Intake 1222.76 ml  Output --  Net 1222.76 ml   Filed Weights   10/17/21 2134  Weight: 96.6 kg     Data Reviewed:   CBC: Recent Labs  Lab 10/17/21 1632 10/18/21 0354 10/19/21 0551 10/19/21 2214 10/20/21 0537 10/21/21 0527 10/22/21 0559  WBC 8.2   < > 5.4 4.8 4.3 3.6* 4.8  NEUTROABS 6.4  --   --   --   --   --   --   HGB 11.7*   < > 9.8* 9.8* 9.6* 9.8* 9.1*  HCT 36.1*   < > 31.1* 30.7* 29.8* 31.0* 29.1*  MCV 83.4   < > 85.2 85.5 85.6 84.5 86.6  PLT 198   < > 166 159 157 174 156   < > = values in this interval not displayed.   Basic Metabolic Panel: Recent Labs  Lab 10/18/21 0354 10/19/21 0551 10/20/21 0537 10/21/21 0527 10/22/21 0559  NA 141 142 146* 149* 143  K 4.2 3.7 3.7 3.7 3.7  CL 100 103 107 115* 109  CO2 29 28 27 24 25   GLUCOSE 114* 97 87 94 133*  BUN 39* 58* 70* 70* 61*  CREATININE 3.16* 4.02* 3.91* 3.28* 2.91*  CALCIUM 8.4* 8.2* 8.4* 8.7* 8.5*  MG 2.3 2.6* 2.8* 2.5* 2.3  PHOS 6.6* 5.4* 5.4* 4.3 3.8   GFR: Estimated Creatinine Clearance: 24.4 mL/min (A) (by C-G formula based on SCr of 2.91 mg/dL (H)). Liver Function Tests: Recent Labs  Lab  10/17/21 1632  AST 21  ALT 17  ALKPHOS 70  BILITOT 0.9  PROT 7.5  ALBUMIN 4.1   No results for input(s): LIPASE, AMYLASE in the last 168 hours. No results for input(s): AMMONIA in the last 168 hours. Coagulation Profile: No results for input(s): INR, PROTIME in the last 168 hours. Cardiac Enzymes: No results for input(s): CKTOTAL, CKMB, CKMBINDEX, TROPONINI in the last 168 hours. BNP (last 3 results) No results for input(s): PROBNP in the last 8760 hours. HbA1C: No results for input(s): HGBA1C in the last 72 hours.  CBG: Recent Labs  Lab 10/21/21 0726 10/21/21 1105 10/21/21 1627 10/21/21 2335 10/22/21 0553  GLUCAP 89 150* 135* 140* 127*   Lipid Profile: No results for input(s): CHOL, HDL, LDLCALC, TRIG, CHOLHDL, LDLDIRECT in the last 72 hours. Thyroid Function Tests: No results for input(s): TSH, T4TOTAL, FREET4, T3FREE, THYROIDAB in the last 72 hours. Anemia Panel: No results for input(s): VITAMINB12, FOLATE, FERRITIN, TIBC, IRON, RETICCTPCT in the last 72 hours. Sepsis Labs: No results for input(s): PROCALCITON, LATICACIDVEN in the last 168 hours.  Recent Results (from the past 240 hour(s))  Resp Panel by RT-PCR (Flu A&B, Covid) Nasopharyngeal Swab     Status: None   Collection Time: 10/17/21  4:30 PM   Specimen: Nasopharyngeal Swab; Nasopharyngeal(NP) swabs in vial transport medium  Result Value Ref Range Status   SARS Coronavirus 2 by RT PCR NEGATIVE NEGATIVE Final    Comment: (NOTE) SARS-CoV-2 target nucleic acids are NOT DETECTED.  The SARS-CoV-2 RNA is generally detectable in upper respiratory specimens during the acute phase of infection. The lowest concentration of SARS-CoV-2 viral copies this assay can detect is 138 copies/mL. A negative result does not preclude SARS-Cov-2 infection and should not be used as the sole basis for treatment or other patient management decisions. A negative result may occur with  improper specimen collection/handling,  submission of specimen other than nasopharyngeal swab, presence of viral mutation(s) within the areas targeted by this assay, and inadequate number of viral copies(<138 copies/mL). A negative result must be combined with clinical observations, patient history, and epidemiological information. The expected result is Negative.  Fact Sheet for Patients:  EntrepreneurPulse.com.au  Fact Sheet for Healthcare Providers:  IncredibleEmployment.be  This test is no t yet approved or cleared by the [place] FDA and  has been authorized for detection and/or diagnosis of SARS-CoV-2 by FDA under an Emergency Use Authorization (EUA). This EUA will remain  in effect (meaning this test can be used) for the duration of the COVID-19 declaration under Section 564(b)(1) of the Act, 21 U.S.C.section 360bbb-3(b)(1), unless the authorization is terminated  or revoked sooner.       Influenza A  by PCR NEGATIVE NEGATIVE Final   Influenza B by PCR NEGATIVE NEGATIVE Final    Comment: (NOTE) The Xpert Xpress SARS-CoV-2/FLU/RSV plus assay is intended as an aid in the diagnosis of influenza from Nasopharyngeal swab specimens and should not be used as a sole basis for treatment. Nasal washings and aspirates are unacceptable for Xpert Xpress SARS-CoV-2/FLU/RSV testing.  Fact Sheet for Patients: EntrepreneurPulse.com.au  Fact Sheet for Healthcare Providers: IncredibleEmployment.be  This test is not yet approved or cleared by the [place] FDA and has been authorized for detection and/or diagnosis of SARS-CoV-2 by FDA under an Emergency Use Authorization (EUA). This EUA will remain in effect (meaning this test can be used) for the duration of the COVID-19 declaration under Section 564(b)(1) of the Act, 21 U.S.C. section 360bbb-3(b)(1), unless the authorization is terminated or revoked.  Performed at [facility], [address], [place], [place] [address]          Radiology Studies: No results found.      Scheduled Meds:  allopurinol  100 mg Oral Daily   amLODipine  10 mg Oral Daily   atorvastatin  40 mg Oral q1800   carvedilol  12.5 mg Oral BID WC   enoxaparin (LOVENOX) injection  30 mg Subcutaneous Q24H   insulin aspart  0-6 Units Subcutaneous TID AC   pantoprazole (PROTONIX) IV  40 mg Intravenous Q24H   polyethylene glycol  17 g Oral Daily   senna-docusate  2 tablet Oral QHS   Continuous Infusions:  dextrose 5 % and 0.45% NaCl 100 mL/hr at 10/22/21 0456     LOS: 5 days   Time spent= 35 mins    Nicklous Aburto Arsenio Loader, MD [facility]  If 7PM-7AM, please contact night-coverage  10/22/2021, 8:11 AM

## 2021-10-22 NOTE — Progress Notes (Signed)
°  Subjective: Patient has had multiple bowel movements over the past 24 hours.  He is having some gastric cramping which I told him was not uncommon after a bowel obstruction/ileus resolves.  Pressure patient has had no emesis.  Objective: Vital signs in last 24 hours: Temp:  [98.1 F (36.7 C)-99 F (37.2 C)] 99 F (37.2 C) (12/23 0531) Pulse Rate:  [65-76] 69 (12/23 0833) Resp:  [18-19] 19 (12/23 0531) BP: (128-148)/(77-92) 128/92 (12/23 0833) SpO2:  [86 %-97 %] 93 % (12/23 0849) Last BM Date: 10/21/21  Intake/Output from previous day: 12/22 0701 - 12/23 0700 In: 1222.8 [P.O.:600; I.V.:622.8] Out: -  Intake/Output this shift: Total I/O In: 320 [P.O.:320] Out: 500 [Urine:500]  General appearance: alert, cooperative, and no distress Resp: clear to auscultation bilaterally Cardio: regular rate and rhythm, S1, S2 normal, no murmur, click, rub or gallop GI: Soft, mildly distended.  Active bowel sounds appreciated.  No rigidity is noted.  Lab Results:  Recent Labs    10/21/21 0527 10/22/21 0559  WBC 3.6* 4.8  HGB 9.8* 9.1*  HCT 31.0* 29.1*  PLT 174 156   BMET Recent Labs    10/21/21 0527 10/22/21 0559  NA 149* 143  K 3.7 3.7  CL 115* 109  CO2 24 25  GLUCOSE 94 133*  BUN 70* 61*  CREATININE 3.28* 2.91*  CALCIUM 8.7* 8.5*   PT/INR No results for input(s): LABPROT, INR in the last 72 hours.  Studies/Results: No results found.  Anti-infectives: Anti-infectives (From admission, onward)    None       Assessment/Plan: Impression:Partial small bowel obstruction, resolving.  CKI resolving.  Patient having upper abdominal cramping secondary to hyperactive bowel motility. Plan: Will advance to heart healthy diet.  Anticipate discharge in next 24 to 48 hours.  Continue hydration to treat his acute kidney injury.  LOS: 5 days    Aviva Signs 10/22/2021

## 2021-10-23 LAB — BASIC METABOLIC PANEL
Anion gap: 6 (ref 5–15)
BUN: 62 mg/dL — ABNORMAL HIGH (ref 8–23)
CO2: 25 mmol/L (ref 22–32)
Calcium: 8.1 mg/dL — ABNORMAL LOW (ref 8.9–10.3)
Chloride: 108 mmol/L (ref 98–111)
Creatinine, Ser: 3.06 mg/dL — ABNORMAL HIGH (ref 0.61–1.24)
GFR, Estimated: 20 mL/min — ABNORMAL LOW (ref >60)
Glucose, Bld: 111 mg/dL — ABNORMAL HIGH (ref 70–99)
Potassium: 3.7 mmol/L (ref 3.5–5.1)
Sodium: 139 mmol/L (ref 135–145)

## 2021-10-23 LAB — GLUCOSE, CAPILLARY: Glucose-Capillary: 126 mg/dL — ABNORMAL HIGH (ref 70–99)

## 2021-10-23 MED ORDER — DEXTROSE-NACL 5-0.45 % IV SOLN: INTRAVENOUS | Status: AC

## 2021-10-23 NOTE — Progress Notes (Signed)
Patient in bed, ambulating to bathroom with no assistance. Patient voices no complaints at this time. Gen. Surg at bedside and speaking to patient on discharge.

## 2021-10-23 NOTE — Plan of Care (Signed)
°  Problem: Education: Goal: Knowledge of General Education information will improve Description: Including pain rating scale, medication(s)/side effects and non-pharmacologic comfort measures Outcome: Adequate for Discharge   Problem: Health Behavior/Discharge Planning: Goal: Ability to manage health-related needs will improve Outcome: Adequate for Discharge   Problem: Clinical Measurements: Goal: Ability to maintain clinical measurements within normal limits will improve Outcome: Adequate for Discharge Goal: Will remain free from infection Outcome: Adequate for Discharge Goal: Diagnostic test results will improve Outcome: Adequate for Discharge Goal: Respiratory complications will improve Outcome: Adequate for Discharge Goal: Cardiovascular complication will be avoided Outcome: Adequate for Discharge   Problem: Activity: Goal: Risk for activity intolerance will decrease Outcome: Adequate for Discharge   Problem: Elimination: Goal: Will not experience complications related to bowel motility Outcome: Adequate for Discharge Goal: Will not experience complications related to urinary retention Outcome: Adequate for Discharge   Problem: Pain Managment: Goal: General experience of comfort will improve Outcome: Adequate for Discharge   Problem: Safety: Goal: Ability to remain free from injury will improve Outcome: Adequate for Discharge

## 2021-10-23 NOTE — Progress Notes (Signed)
Patient discharged home today per MD orders. Patient vital signs WDL. IV removed and site WDL. Discharge Instructions including follow up appointments, medications, and education reviewed with patient. Patient verbalizes understanding. Patient is transported out via wheelchair.  

## 2021-10-23 NOTE — Discharge Summary (Signed)
Physician Discharge Summary  Patient ID: Andrew Adams MRN: [id number] DOB/AGE: Dec 13, 1945 75 y.o.  Admit date: 10/17/2021 Discharge date: 10/23/2021  Admission Diagnoses: Small bowel obstruction  Discharge Diagnoses: Same Principal Problem:   Small bowel obstruction due to adhesions [facility]) Active Problems:   Essential hypertension   COPD (chronic obstructive pulmonary disease) (HCC)   CKD (chronic kidney disease), stage IV (HCC)   History of anemia due to CKD   Discharged Condition: fair  Hospital Course: Patient is a 75 year old black male with multiple medical problems including chronic kidney disease, COPD, and history of anemia due to his renal insufficiency who presented to the hospital on 10/17/2021 with worsening abdominal distention, nausea, and vomiting.  A CT scan of the abdomen revealed a small bowel obstruction.  This was most likely secondary to adhesive disease as the patient has had multiple abdominal surgeries in the past.  He has also had an episode of a bowel obstruction which was treated conservatively.  He was noted to have worsening renal function and required hydration.  An NG tube was placed.  He did undergo a small bowel obstruction protocol which revealed no high-grade obstruction.  His NG tube was removed.  His a diet was advanced slowly due to some epigastric discomfort.  He has continued to move his bowels and passed flatus.  He has not had a nausea or vomiting in the past 24 hours.  He is being discharged home on 10/23/2021 in fair and stable condition.  He does have a son who will be looking in on him when he gets home.   Discharge Exam: Blood pressure 124/67, pulse 78, temperature 98.1 F (36.7 C), resp. rate 18, height 5\' 9"  (1.753 m), weight 96.6 kg, SpO2 92 %. General appearance: alert, cooperative, and no distress Resp: clear to auscultation bilaterally Cardio: regular rate and rhythm, S1, S2 normal, no murmur, click, rub or gallop GI: Soft, mildly  distended, though he states this is his baseline.  Bowel sounds are present.  No rigidity is noted.  Disposition: Discharge disposition: 01-Home or Self Care       Discharge Instructions     Diet - low sodium heart healthy   Complete by: As directed    Increase activity slowly   Complete by: As directed       Allergies as of 10/23/2021   No Known Allergies      Medication List     TAKE these medications    albuterol 108 (90 Base) MCG/ACT inhaler Commonly known as: VENTOLIN HFA Inhale into the lungs.   allopurinol 100 MG tablet Commonly known as: ZYLOPRIM Take 1 tablet (100 mg total) by mouth daily.   amLODipine 10 MG tablet Commonly known as: NORVASC Take 10 mg by mouth daily.   amLODipine 10 MG tablet Commonly known as: NORVASC Take by mouth.   atorvastatin 40 MG tablet Commonly known as: LIPITOR Take 1 tablet (40 mg total) by mouth daily.   carvedilol 12.5 MG tablet Commonly known as: COREG Take 12.5 mg by mouth 2 (two) times daily with a meal.   FeroSul 325 (65 FE) MG tablet Generic drug: ferrous sulfate TAKE ONE TABLET BY MOUTH EVERY MORNING WITH BREAKFAST   furosemide 40 MG tablet Commonly known as: LASIX Take 80 mg by mouth daily.   ipratropium-albuterol 0.5-2.5 (3) MG/3ML Soln Commonly known as: DUONEB Take 3 mLs by nebulization every 6 (six) hours as needed.   losartan 50 MG tablet Commonly known as: COZAAR TAKE ONE TABLET  BY MOUTH TWICE DAILY   metoprolol tartrate 50 MG tablet Commonly known as: LOPRESSOR Take 1 tablet (50 mg total) by mouth 2 (two) times daily.   multivitamin with minerals Tabs tablet Take 1 tablet by mouth daily.   OXYGEN Inhale 2 L into the lungs continuous.   pantoprazole 40 MG tablet Commonly known as: PROTONIX Take 1 tablet (40 mg total) by mouth 2 (two) times daily before a meal. What changed: when to take this   Retacrit 3000 UNIT/ML injection Generic drug: epoetin alfa-epbx Inject 3,000 Units into  the vein every 14 (fourteen) days.   Trulicity 1.59 YV/8.5FY Sopn Generic drug: Dulaglutide Inject 0.75 mg into the skin every 7 (seven) days.   valsartan 40 MG tablet Commonly known as: DIOVAN Take 40 mg by mouth daily.        Follow-up Information     Aviva Signs, MD Follow up.   Specialty: General Surgery Why: As needed Contact information: [address] [place] [address] [phone]         Lavella Lemons, PA Follow up.   Specialty: Physician Assistant Why: Follow up renal function as previously scheduled Contact information: [place] [address] [phone]                 Signed: Aviva Signs 10/23/2021, 11:24 AM

## 2021-10-23 NOTE — Progress Notes (Signed)
PROGRESS NOTE    ARIC JOST  GDJ:[id number] DOB: [birth year] DOA: 10/17/2021 PCP: Lavella Lemons, PA   Brief Narrative:  75 y.o. male reformed smoker with past medical history relevant for mixed type COPD , OSA, CKD stage IV , HTN, GERD , prior history of abdominal surgeries and chronic anemia of CKD who was admitted on 10/17/2021 with high-grade SBO.  Medical team was consulted for acute kidney injury.  Renal function has been improving with fluids.  Advised to hold Lasix for next few days until follow-up outpatient with PCP/nephrology.   Assessment & Plan:   Principal Problem:   Small bowel obstruction due to adhesions [facility]) Active Problems:   Essential hypertension   COPD (chronic obstructive pulmonary disease) (HCC)   CKD (chronic kidney disease), stage IV (HCC)   History of anemia due to CKD  Acute kidney injury on CKD IV -Baseline creatinine 2.4.  Admission creatinine 4.0.  Improving with IVF fluids 3.91 > 3.28 > 2.91.  Continue D5 half-normal saline while here. Ok to resume outptn lasix and losartan in next 3-5 days with close monitoring by her outptn PCP and Nephrologist.  Outpatient creatinine check in the next 3-5 days. - Periodic bladder scan, In-N-Out catheter if necessary  High Grade SBO- presumably  from adhesions  - This appears to have resolved.   COPD/OSA--unable to use CPAP/BiPAP due to continuous NG suction -As needed bronchodilators   Essential hypertension - Continue Norvasc, Coreg.    GERD - PPI   HFpEF--history of chronic diastolic dysfunction CHF, EF was 55 to 60% per echo from 10/30/2019 with grade 1 diastolic dysfunction -Currently Lasix and losartan on hold.  Getting gentle hydration.   Chronic anemia of CKD  - Hemoglobin stable.  Consider outpatient ESA/Procrit   Overall medically stable from medicine standpoint.    DVT prophylaxis: SCDs Start: 10/17/21 2137 Code Status: Full Family Communication:    Subjective: Feeling well sitting  up in the chair no complaints   Examination: Constitutional: Not in acute distress Respiratory: Clear to auscultation bilaterally Cardiovascular: Normal sinus rhythm, no rubs Abdomen: Nontender nondistended good bowel sounds Musculoskeletal: No edema noted Skin: No rashes seen Neurologic: CN 2-12 grossly intact.  And nonfocal Psychiatric: Normal judgment and insight. Alert and oriented x 3. Normal mood.  Objective: Vitals:   10/22/21 1417 10/22/21 2011 10/23/21 0517 10/23/21 0818  BP: 134/81 129/74 115/74 124/67  Pulse: 69 72 77 78  Resp: 18 20 20 18   Temp: 99.2 F (37.3 C) 98.7 F (37.1 C) 97.6 F (36.4 C) 98.1 F (36.7 C)  TempSrc: Oral Oral    SpO2: 90% 91% 90% 92%  Weight:      Height:        Intake/Output Summary (Last 24 hours) at 10/23/2021 1207 Last data filed at 10/23/2021 0900 Gross per 24 hour  Intake 3587.87 ml  Output 200 ml  Net 3387.87 ml   Filed Weights   10/17/21 2134  Weight: 96.6 kg     Data Reviewed:   CBC: Recent Labs  Lab 10/17/21 1632 10/18/21 0354 10/19/21 0551 10/19/21 2214 10/20/21 0537 10/21/21 0527 10/22/21 0559  WBC 8.2   < > 5.4 4.8 4.3 3.6* 4.8  NEUTROABS 6.4  --   --   --   --   --   --   HGB 11.7*   < > 9.8* 9.8* 9.6* 9.8* 9.1*  HCT 36.1*   < > 31.1* 30.7* 29.8* 31.0* 29.1*  MCV 83.4   < >  85.2 85.5 85.6 84.5 86.6  PLT 198   < > 166 159 157 174 156   < > = values in this interval not displayed.   Basic Metabolic Panel: Recent Labs  Lab 10/18/21 0354 10/19/21 0551 10/20/21 0537 10/21/21 0527 10/22/21 0559 10/23/21 0506  NA 141 142 146* 149* 143 139  K 4.2 3.7 3.7 3.7 3.7 3.7  CL 100 103 107 115* 109 108  CO2 29 28 27 24 25 25   GLUCOSE 114* 97 87 94 133* 111*  BUN 39* 58* 70* 70* 61* 62*  CREATININE 3.16* 4.02* 3.91* 3.28* 2.91* 3.06*  CALCIUM 8.4* 8.2* 8.4* 8.7* 8.5* 8.1*  MG 2.3 2.6* 2.8* 2.5* 2.3  --   PHOS 6.6* 5.4* 5.4* 4.3 3.8  --    GFR: Estimated Creatinine Clearance: 23.2 mL/min (A) (by C-G  formula based on SCr of 3.06 mg/dL (H)). Liver Function Tests: Recent Labs  Lab 10/17/21 1632  AST 21  ALT 17  ALKPHOS 70  BILITOT 0.9  PROT 7.5  ALBUMIN 4.1   No results for input(s): LIPASE, AMYLASE in the last 168 hours. No results for input(s): AMMONIA in the last 168 hours. Coagulation Profile: No results for input(s): INR, PROTIME in the last 168 hours. Cardiac Enzymes: No results for input(s): CKTOTAL, CKMB, CKMBINDEX, TROPONINI in the last 168 hours. BNP (last 3 results) No results for input(s): PROBNP in the last 8760 hours. HbA1C: No results for input(s): HGBA1C in the last 72 hours.  CBG: Recent Labs  Lab 10/22/21 0813 10/22/21 1109 10/22/21 1619 10/22/21 2158 10/23/21 0752  GLUCAP 107* 138* 132* 109* 126*   Lipid Profile: No results for input(s): CHOL, HDL, LDLCALC, TRIG, CHOLHDL, LDLDIRECT in the last 72 hours. Thyroid Function Tests: No results for input(s): TSH, T4TOTAL, FREET4, T3FREE, THYROIDAB in the last 72 hours. Anemia Panel: No results for input(s): VITAMINB12, FOLATE, FERRITIN, TIBC, IRON, RETICCTPCT in the last 72 hours. Sepsis Labs: No results for input(s): PROCALCITON, LATICACIDVEN in the last 168 hours.  Recent Results (from the past 240 hour(s))  Resp Panel by RT-PCR (Flu A&B, Covid) Nasopharyngeal Swab     Status: None   Collection Time: 10/17/21  4:30 PM   Specimen: Nasopharyngeal Swab; Nasopharyngeal(NP) swabs in vial transport medium  Result Value Ref Range Status   SARS Coronavirus 2 by RT PCR NEGATIVE NEGATIVE Final    Comment: (NOTE) SARS-CoV-2 target nucleic acids are NOT DETECTED.  The SARS-CoV-2 RNA is generally detectable in upper respiratory specimens during the acute phase of infection. The lowest concentration of SARS-CoV-2 viral copies this assay can detect is 138 copies/mL. A negative result does not preclude SARS-Cov-2 infection and should not be used as the sole basis for treatment or other patient management  decisions. A negative result may occur with  improper specimen collection/handling, submission of specimen other than nasopharyngeal swab, presence of viral mutation(s) within the areas targeted by this assay, and inadequate number of viral copies(<138 copies/mL). A negative result must be combined with clinical observations, patient history, and epidemiological information. The expected result is Negative.  Fact Sheet for Patients:  EntrepreneurPulse.com.au  Fact Sheet for Healthcare Providers:  IncredibleEmployment.be  This test is no t yet approved or cleared by the [place] FDA and  has been authorized for detection and/or diagnosis of SARS-CoV-2 by FDA under an Emergency Use Authorization (EUA). This EUA will remain  in effect (meaning this test can be used) for the duration of the COVID-19 declaration under Section 564(b)(1) of  the Act, 21 U.S.C.section 360bbb-3(b)(1), unless the authorization is terminated  or revoked sooner.       Influenza A by PCR NEGATIVE NEGATIVE Final   Influenza B by PCR NEGATIVE NEGATIVE Final    Comment: (NOTE) The Xpert Xpress SARS-CoV-2/FLU/RSV plus assay is intended as an aid in the diagnosis of influenza from Nasopharyngeal swab specimens and should not be used as a sole basis for treatment. Nasal washings and aspirates are unacceptable for Xpert Xpress SARS-CoV-2/FLU/RSV testing.  Fact Sheet for Patients: EntrepreneurPulse.com.au  Fact Sheet for Healthcare Providers: IncredibleEmployment.be  This test is not yet approved or cleared by the [place] FDA and has been authorized for detection and/or diagnosis of SARS-CoV-2 by FDA under an Emergency Use Authorization (EUA). This EUA will remain in effect (meaning this test can be used) for the duration of the COVID-19 declaration under Section 564(b)(1) of the Act, 21 U.S.C. section 360bbb-3(b)(1), unless the  authorization is terminated or revoked.  Performed at [facility], [address], [place], [place] [address]          Radiology Studies: No results found.      Scheduled Meds:  allopurinol  100 mg Oral Daily   amLODipine  10 mg Oral Daily   atorvastatin  40 mg Oral q1800   carvedilol  12.5 mg Oral BID WC   insulin aspart  0-6 Units Subcutaneous TID AC   pantoprazole  40 mg Oral Q1200   polyethylene glycol  17 g Oral Daily   senna-docusate  2 tablet Oral QHS   Continuous Infusions:     LOS: 6 days   Time spent= 35 mins    Myalee Stengel Arsenio Loader, MD [facility]  If 7PM-7AM, please contact night-coverage  10/23/2021, 12:07 PM

## 2021-10-26 ENCOUNTER — Other Ambulatory Visit: Payer: Self-pay

## 2021-10-26 ENCOUNTER — Encounter (HOSPITAL_COMMUNITY): Payer: Medicare HMO

## 2021-10-26 ENCOUNTER — Encounter (HOSPITAL_COMMUNITY)
Admission: RE | Admit: 2021-10-26 | Discharge: 2021-10-26 | Disposition: A | Discharge status: Home / Self Care | Payer: Medicare HMO | Source: Ambulatory Visit | Attending: Nephrology | Admitting: Nephrology

## 2021-10-26 DIAGNOSIS — N184 Chronic kidney disease, stage 4 (severe): Secondary | ICD-10-CM | POA: Diagnosis not present

## 2021-10-26 DIAGNOSIS — D631 Anemia in chronic kidney disease: Secondary | ICD-10-CM | POA: Diagnosis not present

## 2021-10-26 LAB — RENAL FUNCTION PANEL
Albumin: 3.5 g/dL (ref 3.5–5.0)
Anion gap: 9 (ref 5–15)
BUN: 35 mg/dL — ABNORMAL HIGH (ref 8–23)
CO2: 23 mmol/L (ref 22–32)
Calcium: 8.3 mg/dL — ABNORMAL LOW (ref 8.9–10.3)
Chloride: 103 mmol/L (ref 98–111)
Creatinine, Ser: 2.39 mg/dL — ABNORMAL HIGH (ref 0.61–1.24)
GFR, Estimated: 27 mL/min — ABNORMAL LOW (ref >60)
Glucose, Bld: 109 mg/dL — ABNORMAL HIGH (ref 70–99)
Phosphorus: 3.2 mg/dL (ref 2.5–4.6)
Potassium: 3.5 mmol/L (ref 3.5–5.1)
Sodium: 135 mmol/L (ref 135–145)

## 2021-10-26 LAB — PROTEIN / CREATININE RATIO, URINE
Creatinine, Urine: 104.64 mg/dL
Protein Creatinine Ratio: 0.55 mg/mg{Cre} — ABNORMAL HIGH (ref 0.00–0.15)
Total Protein, Urine: 58 mg/dL

## 2021-10-26 LAB — IRON AND TIBC
Iron: 63 ug/dL (ref 45–182)
Saturation Ratios: 24 % (ref 17.9–39.5)
TIBC: 263 ug/dL (ref 250–450)
UIBC: 200 ug/dL

## 2021-10-26 LAB — POCT HEMOGLOBIN-HEMACUE: Hemoglobin: 10.1 g/dL — ABNORMAL LOW (ref 13.0–17.0)

## 2021-10-26 MED ORDER — EPOETIN ALFA-EPBX 3000 UNIT/ML IJ SOLN: 3000.0000 [IU] | Freq: Once | INTRAMUSCULAR | Status: DC

## 2021-10-27 LAB — PTH, INTACT AND CALCIUM
Calcium, Total (PTH): 8.5 mg/dL — ABNORMAL LOW (ref 8.6–10.2)
PTH: 59 pg/mL (ref 15–65)

## 2021-10-29 DIAGNOSIS — E1122 Type 2 diabetes mellitus with diabetic chronic kidney disease: Secondary | ICD-10-CM | POA: Diagnosis not present

## 2021-10-29 DIAGNOSIS — E7849 Other hyperlipidemia: Secondary | ICD-10-CM | POA: Diagnosis not present

## 2021-10-29 DIAGNOSIS — I129 Hypertensive chronic kidney disease with stage 1 through stage 4 chronic kidney disease, or unspecified chronic kidney disease: Secondary | ICD-10-CM | POA: Diagnosis not present

## 2021-10-29 DIAGNOSIS — N184 Chronic kidney disease, stage 4 (severe): Secondary | ICD-10-CM | POA: Diagnosis not present

## 2021-11-03 ENCOUNTER — Encounter (INDEPENDENT_AMBULATORY_CARE_PROVIDER_SITE_OTHER): Payer: Self-pay | Admitting: *Deleted

## 2021-11-09 ENCOUNTER — Other Ambulatory Visit: Payer: Self-pay

## 2021-11-09 ENCOUNTER — Encounter (HOSPITAL_COMMUNITY)
Admission: RE | Admit: 2021-11-09 | Discharge: 2021-11-09 | Disposition: A | Discharge status: Home / Self Care | Payer: Medicare HMO | Source: Ambulatory Visit | Attending: Nephrology | Admitting: Nephrology

## 2021-11-09 ENCOUNTER — Encounter (HOSPITAL_COMMUNITY): Payer: Self-pay

## 2021-11-09 VITALS — BP 130/79 | HR 74 | Temp 97.7°F | Resp 20 | Ht 69.0 in | Wt 213.0 lb

## 2021-11-09 DIAGNOSIS — Z862 Personal history of diseases of the blood and blood-forming organs and certain disorders involving the immune mechanism: Secondary | ICD-10-CM | POA: Insufficient documentation

## 2021-11-09 DIAGNOSIS — N184 Chronic kidney disease, stage 4 (severe): Secondary | ICD-10-CM | POA: Insufficient documentation

## 2021-11-09 DIAGNOSIS — N189 Chronic kidney disease, unspecified: Secondary | ICD-10-CM

## 2021-11-09 LAB — COMPREHENSIVE METABOLIC PANEL
ALT: 19 U/L (ref 0–44)
AST: 22 U/L (ref 15–41)
Albumin: 3.6 g/dL (ref 3.5–5.0)
Alkaline Phosphatase: 66 U/L (ref 38–126)
Anion gap: 9 (ref 5–15)
BUN: 30 mg/dL — ABNORMAL HIGH (ref 8–23)
CO2: 27 mmol/L (ref 22–32)
Calcium: 8.6 mg/dL — ABNORMAL LOW (ref 8.9–10.3)
Chloride: 99 mmol/L (ref 98–111)
Creatinine, Ser: 2.62 mg/dL — ABNORMAL HIGH (ref 0.61–1.24)
GFR, Estimated: 24 mL/min — ABNORMAL LOW (ref >60)
Glucose, Bld: 167 mg/dL — ABNORMAL HIGH (ref 70–99)
Potassium: 3.8 mmol/L (ref 3.5–5.1)
Sodium: 135 mmol/L (ref 135–145)
Total Bilirubin: 0.4 mg/dL (ref 0.3–1.2)
Total Protein: 6.7 g/dL (ref 6.5–8.1)

## 2021-11-09 LAB — POCT HEMOGLOBIN-HEMACUE: Hemoglobin: 10.5 g/dL — ABNORMAL LOW (ref 13.0–17.0)

## 2021-11-09 MED ORDER — EPOETIN ALFA-EPBX 3000 UNIT/ML IJ SOLN: 3000.0000 [IU] | Freq: Once | INTRAMUSCULAR | Status: DC

## 2021-11-15 DIAGNOSIS — G4733 Obstructive sleep apnea (adult) (pediatric): Secondary | ICD-10-CM | POA: Diagnosis not present

## 2021-11-20 DIAGNOSIS — I509 Heart failure, unspecified: Secondary | ICD-10-CM | POA: Diagnosis not present

## 2021-11-20 DIAGNOSIS — J449 Chronic obstructive pulmonary disease, unspecified: Secondary | ICD-10-CM | POA: Diagnosis not present

## 2021-11-22 DIAGNOSIS — N189 Chronic kidney disease, unspecified: Secondary | ICD-10-CM | POA: Diagnosis not present

## 2021-11-22 DIAGNOSIS — E119 Type 2 diabetes mellitus without complications: Secondary | ICD-10-CM | POA: Diagnosis not present

## 2021-11-22 DIAGNOSIS — I5032 Chronic diastolic (congestive) heart failure: Secondary | ICD-10-CM | POA: Diagnosis not present

## 2021-11-22 DIAGNOSIS — Z6831 Body mass index (BMI) 31.0-31.9, adult: Secondary | ICD-10-CM | POA: Diagnosis not present

## 2021-11-22 DIAGNOSIS — Z8719 Personal history of other diseases of the digestive system: Secondary | ICD-10-CM | POA: Diagnosis not present

## 2021-11-22 DIAGNOSIS — I10 Essential (primary) hypertension: Secondary | ICD-10-CM | POA: Diagnosis not present

## 2021-11-23 ENCOUNTER — Encounter (HOSPITAL_COMMUNITY)
Admission: RE | Admit: 2021-11-23 | Discharge: 2021-11-23 | Disposition: A | Discharge status: Home / Self Care | Payer: Medicare HMO | Source: Ambulatory Visit | Attending: Nephrology | Admitting: Nephrology

## 2021-11-23 ENCOUNTER — Other Ambulatory Visit: Payer: Self-pay

## 2021-11-23 ENCOUNTER — Encounter (HOSPITAL_COMMUNITY): Payer: Self-pay

## 2021-11-23 DIAGNOSIS — N184 Chronic kidney disease, stage 4 (severe): Secondary | ICD-10-CM | POA: Diagnosis not present

## 2021-11-23 DIAGNOSIS — Z862 Personal history of diseases of the blood and blood-forming organs and certain disorders involving the immune mechanism: Secondary | ICD-10-CM | POA: Diagnosis not present

## 2021-11-23 LAB — POCT HEMOGLOBIN-HEMACUE: Hemoglobin: 10.5 g/dL — ABNORMAL LOW (ref 13.0–17.0)

## 2021-11-23 MED ORDER — EPOETIN ALFA-EPBX 3000 UNIT/ML IJ SOLN: 3000.0000 [IU] | Freq: Once | INTRAMUSCULAR | Status: DC

## 2021-11-28 DIAGNOSIS — I129 Hypertensive chronic kidney disease with stage 1 through stage 4 chronic kidney disease, or unspecified chronic kidney disease: Secondary | ICD-10-CM | POA: Diagnosis not present

## 2021-11-28 DIAGNOSIS — E1122 Type 2 diabetes mellitus with diabetic chronic kidney disease: Secondary | ICD-10-CM | POA: Diagnosis not present

## 2021-11-28 DIAGNOSIS — N184 Chronic kidney disease, stage 4 (severe): Secondary | ICD-10-CM | POA: Diagnosis not present

## 2021-12-02 ENCOUNTER — Ambulatory Visit (INDEPENDENT_AMBULATORY_CARE_PROVIDER_SITE_OTHER): Payer: Medicare HMO | Admitting: Gastroenterology

## 2021-12-02 ENCOUNTER — Encounter (INDEPENDENT_AMBULATORY_CARE_PROVIDER_SITE_OTHER): Payer: Self-pay | Admitting: Gastroenterology

## 2021-12-02 ENCOUNTER — Other Ambulatory Visit: Payer: Self-pay

## 2021-12-02 VITALS — BP 162/87 | HR 74 | Temp 98.2°F | Ht 69.0 in | Wt 212.1 lb

## 2021-12-02 DIAGNOSIS — D132 Benign neoplasm of duodenum: Secondary | ICD-10-CM | POA: Diagnosis not present

## 2021-12-02 NOTE — Patient Instructions (Signed)
It was nice to meet you! I will discuss your case with Dr. Jenetta Downer about further recommendations for repeating EGD in regards to it being done here or if it would be best for you to go back to Dr. Rush Landmark in case there is recurrence of the polyp that could need removing. I will be in touch with you regarding my recommendations, likely next week.

## 2021-12-02 NOTE — Progress Notes (Signed)
Referring Provider: Lavella Lemons, PA Primary Care Physician:  Lavella Lemons, PA Primary GI Physician: Jenetta Downer  Chief Complaint  Patient presents with   Duodenal polyp    Patient here today for a follow up on a Duodenal Polyp. Denies any Gi symptoms.   HPI:   Andrew Adams is a 76 y.o. male  with medical history significant for chronic diastolic heart failure, COPD, CKD, chronic alcohol abuse, hypertension, hyperlipidemia, GERD and duodenal adenoma.  Patient presenting today for follow up of duodenal adenoma.  Patient evaluated in 2021 for IDA via EGD/Colonoscopy and capsule study, EGD showed evidence of large polypoid mass/lesion consistent with tubulovillous adenoma of duodenum. Patient was referred thereafter to Dr. Rush Landmark for advanced polypectomy. Patient underwent EUS/EGD on 12/14/20 for removal of polyp which was successful. 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 Patient also found to have presence of oozing gastric polyps, hyperplastic, that were removed, this was though to be source of IDA. It was recommended that patient undergo repeat surveillance EGD in 4-6 months to ensure no recurrence of polyps.   Today, patient states that he has some darker stools but is currently on iron supplementation and feels that he has had these since starting iron, he has no rectal bleeding. Denies abdominal pain, nausea, vomiting, or constipation. He does not that he gets diarrhea 1-2x/week with 2-3 episodes of diarrhea per day when this occurs, unsure if it is related to something he has eaten or not. He denies fatigue, shortness of breath or dizziness. Denies any weight loss, changes in appetite or dysphagia.   Last hgb on 11/23/21 was 10.5, however, iron studies in dec 2022 were all WNL. MCV in dec 2022 was 86.6. states tha the goes to [place] once every two  weeks and has epogen shot if hgb is under 10, as he has hx of CDK.   Patient states he had recent hospitalization in December for SBO. He states that this has resolved and he is feeling better, though there is concern that due to previous abdominal surgeries, he has significant scar tissues causing him to have obstructions.  Last Colonoscopy:08/28/20- Hemorrhoids found on perianal exam. - One 3 mm polyp in the cecum, tubular adenoma - Two 4 to 6 mm polyps in the transverse colon and in the ascending colon-tubular adenoma - One 10 mm polyp in the sigmoid colon-tubular adenoma - Diverticulosis in the sigmoid colon and in the descending colon. - Non-bleeding internal hemorrhoids. Last Endoscopy:12/14/20   - No gross lesions in esophagus. Z-line regular, 40 cm from the incisors. Non-obstructing Schatzki ring. - 2 cm hiatal hernia. - Multiple gastric polyps. 3 with evidence of oozing or recent oozing (hyperplastic) - Erythematous mucosa in the stomach. - Nodule found in the duodenum bulb. - No gross lesions in the second portion of the duodenum. - A single large pedunculated/semi-sessile duodenal polyp was found in D3. Using a polylooop, epinephrine injection, orise injection, STSC (as documented above) this lesion was removed in piecemeal fashion. Resected and retrieved. Clips (MR conditional) were placed to decrease risk of bleeding post-intervention (tubulovillous adenoma with high grade dysplasia). Tattooed proximal to this area. - One duodenal polyp in D4. Resected and retrieved. (Tubulovillous adenoma) - Normal examined proximal jejunum.  Recommendations:    Past Medical History:  Diagnosis Date   Anemia    Borderline diabetic    Chronic kidney disease  Duodenal adenoma    2021   GERD (gastroesophageal reflux disease)    Gout    Gouty arthritis    H/O small bowel obstruction    History of back surgery    Hx of adenomatous colonic polyps    2021   Hyperlipidemia     Hypertension    PE (pulmonary embolism)    Sleep apnea     Past Surgical History:  Procedure Laterality Date   ABDOMINAL SURGERY  2103   Smal bowel obstruction    ABDOMINAL SURGERY  1981   Perforated large intestine   back     Lower back   BIOPSY  08/28/2020   Procedure: BIOPSY;  Surgeon: Harvel Quale, MD;  [facility]: [facility];  Service: Gastroenterology;;   CATARACT EXTRACTION W/PHACO Right 01/06/2020   Procedure: CATARACT EXTRACTION PHACO AND INTRAOCULAR LENS PLACEMENT (IOC) (CDE: 6.48);  Surgeon: [name], MD;  Location: [facility];  Service: Ophthalmology;  Laterality: Right;   CATARACT EXTRACTION W/PHACO Left 01/20/2020   Procedure: CATARACT EXTRACTION PHACO AND INTRAOCULAR LENS PLACEMENT (IOC);  Surgeon: [name], MD;  Location: [facility];  Service: Ophthalmology;  Laterality: Left;  CDE: 5.86   COLONOSCOPY WITH PROPOFOL N/A 08/28/2020   Procedure: COLONOSCOPY WITH PROPOFOL;  Surgeon: Harvel Quale, MD;  [facility]: [facility];  Service: Gastroenterology;  Laterality: N/A;  230   ESOPHAGOGASTRODUODENOSCOPY (EGD) WITH PROPOFOL N/A 08/28/2020   Procedure: ESOPHAGOGASTRODUODENOSCOPY (EGD) WITH PROPOFOL;  Surgeon: Harvel Quale, MD;  [facility];  Service: Gastroenterology;  Laterality: N/A;   ESOPHAGOGASTRODUODENOSCOPY (EGD) WITH PROPOFOL N/A 12/14/2020   Procedure: ESOPHAGOGASTRODUODENOSCOPY (EGD) WITH PROPOFOL;  Surgeon: Rush Landmark Telford Nab., MD;  Location: [facility];  Service: Gastroenterology;  Laterality: N/A;   GIVENS CAPSULE STUDY N/A 09/10/2020   Procedure: GIVENS CAPSULE STUDY;  Surgeon: Harvel Quale, MD;  [facility]: [facility];  Service: Gastroenterology;  Laterality: N/A;  [place]  12/14/2020   Procedure: HEMOSTASIS CLIP PLACEMENT;  Surgeon: Irving Copas., MD;  Location: [facility];  Service: Gastroenterology;;   HEMOSTASIS CONTROL  12/14/2020   Procedure:  HEMOSTASIS CONTROL;  Surgeon: Irving Copas., MD;  Location: [facility];  Service: Gastroenterology;;   HERNIA REPAIR     incisional   POLYPECTOMY  08/28/2020   Procedure: POLYPECTOMY;  Surgeon: Harvel Quale, MD;  [facility];  Service: Gastroenterology;;   POLYPECTOMY  12/14/2020   Procedure: POLYPECTOMY;  Surgeon: Irving Copas., MD;  Location: [facility];  Service: Gastroenterology;;   Clide Deutscher  12/14/2020   Procedure: Clide Deutscher;  Surgeon: Mansouraty, Telford Nab., MD;  Location: [facility];  Service: Gastroenterology;;   SUBMUCOSAL TATTOO INJECTION  12/14/2020   Procedure: SUBMUCOSAL TATTOO INJECTION;  Surgeon: Irving Copas., MD;  Location: [facility];  Service: Gastroenterology;;    Current Outpatient Medications  Medication Sig Dispense Refill   allopurinol (ZYLOPRIM) 100 MG tablet Take 1 tablet (100 mg total) by mouth daily. 90 tablet 1   amLODipine (NORVASC) 10 MG tablet Take 10 mg by mouth daily.     atorvastatin (LIPITOR) 40 MG tablet Take 1 tablet (40 mg total) by mouth daily. 90 tablet 1   carvedilol (COREG) 12.5 MG tablet Take 12.5 mg by mouth 2 (two) times daily with a meal.     FEROSUL 325 (65 Fe) MG tablet TAKE ONE TABLET BY MOUTH EVERY MORNING WITH BREAKFAST 90 tablet 3   furosemide (LASIX) 40 MG tablet Take 40 mg by mouth  daily. 40 in am and 20 mg in afternoon     Multiple Vitamin (MULTIVITAMIN WITH MINERALS) TABS tablet Take 1 tablet by mouth daily.     pantoprazole (PROTONIX) 40 MG tablet Take 1 tablet (40 mg total) by mouth 2 (two) times daily before a meal. (Patient taking differently: Take 40 mg by mouth daily.) 60 tablet 6   albuterol (VENTOLIN HFA) 108 (90 Base) MCG/ACT inhaler Inhale into the lungs. (Patient not taking: Reported on 12/02/2021)     epoetin alfa-epbx (RETACRIT) 3000 UNIT/ML injection Inject 3,000 Units into the vein every 14 (fourteen) days.      ipratropium-albuterol (DUONEB) 0.5-2.5 (3)  MG/3ML SOLN Take 3 mLs by nebulization every 6 (six) hours as needed. (Patient not taking: Reported on 12/02/2021) 360 mL 0   losartan (COZAAR) 50 MG tablet TAKE ONE TABLET BY MOUTH TWICE DAILY (Patient not taking: Reported on 10/17/2021) 60 tablet 2   metoprolol (LOPRESSOR) 50 MG tablet Take 1 tablet (50 mg total) by mouth 2 (two) times daily. (Patient not taking: Reported on 10/17/2021) 180 tablet 1   OXYGEN Inhale 2 L into the lungs continuous. (Patient not taking: Reported on 94/76/5465)     TRULICITY 0.35 WS/5.6CL SOPN Inject 0.75 mg into the skin every 7 (seven) days.  (Patient not taking: Reported on 12/02/2021)     valsartan (DIOVAN) 40 MG tablet Take 40 mg by mouth daily. (Patient not taking: Reported on 12/02/2021)     No current facility-administered medications for this visit.    Allergies as of 12/02/2021   (No Known Allergies)    Family History  Problem Relation Age of Onset   Hypertension Sister    Diabetes Sister    Diabetes Sister    Hypertension Mother    Diabetes Mother    Hypertension Father    Ulcers Father    Colon cancer Neg Hx    Esophageal cancer Neg Hx    Stomach cancer Neg Hx    Inflammatory bowel disease Neg Hx    Liver disease Neg Hx    Pancreatic cancer Neg Hx    Rectal cancer Neg Hx     Social History   Socioeconomic History   Marital status: Married    Spouse name: Not on file   Number of children: 1   Years of education: Not on file   Highest education level: Not on file  Occupational History   Occupation: Finisher  Tobacco Use   Smoking status: Former    Packs/day: 1.00    Years: 38.00    Pack years: 38.00    Types: Cigarettes    Quit date: 02/07/1995    Years since quitting: 26.8   Smokeless tobacco: Never  Vaping Use   Vaping Use: Never used  Substance and Sexual Activity   Alcohol use: Yes    Comment: occasional    Drug use: No   Sexual activity: Yes  Other Topics Concern   Not on file  Social History Narrative   Lives with  wife.     Social Determinants of Health   Financial Resource Strain: Not on file  Food Insecurity: Not on file  Transportation Needs: Not on file  Physical Activity: Not on file  Stress: Not on file  Social Connections: Not on file   Review of systems General: negative for malaise, night sweats, fever, chills, weight loss Neck: Negative for lumps, goiter, pain and significant neck swelling Resp: Negative for cough, wheezing, dyspnea at rest CV: Negative for chest  pain, leg swelling, palpitations, orthopnea GI: denies melena, hematochezia, nausea, vomiting, diarrhea, constipation, dysphagia, odyonophagia, early satiety or unintentional weight loss. +dark stools ( likely related to iron) MSK: Negative for joint pain or swelling, back pain, and muscle pain. Derm: Negative for itching or rash Psych: Denies depression, anxiety, memory loss, confusion. No homicidal or suicidal ideation.  Heme: Negative for prolonged bleeding, bruising easily, and swollen nodes. Endocrine: Negative for cold or heat intolerance, polyuria, polydipsia and goiter. Neuro: negative for tremor, gait imbalance, syncope and seizures. The remainder of the review of systems is noncontributory.  Physical Exam: BP (!) 162/87 (BP Location: Left Arm, Patient Position: Sitting, Cuff Size: Large)    Pulse 74    Temp 98.2 F (36.8 C) (Oral)    Ht 5\' 9"  (1.753 m)    Wt 212 lb 1.6 oz (96.2 kg)    BMI 31.32 kg/m  General:   Alert and oriented. No distress noted. Pleasant and cooperative.  Head:  Normocephalic and atraumatic. Eyes:  Conjuctiva clear without scleral icterus. Mouth:  Oral mucosa pink and moist. Good dentition. No lesions. Heart: Normal rate and rhythm, s1 and s2 heart sounds present.  Lungs: Clear lung sounds in all lobes. Respirations equal and unlabored. Abdomen:  +BS, soft, non-tender and non-distended. No rebound or guarding. No HSM or masses noted. Derm: No palmar erythema or jaundice Msk:  Symmetrical  without gross deformities. Normal posture. Extremities:  Without edema. Neurologic:  Alert and  oriented x4 Psych:  Alert and cooperative. Normal mood and affect.  Invalid input(s): 6 MONTHS   ASSESSMENT: BRECCAN GALANT is a 76 y.o. male presenting today for follow up of previously removed duodenal adenoma with high grade dysplasia, by Dr. Rush Landmark.  It was recommended that patient has surveillance EGD done 4-6 months after to ensure no further polyps were present, in hopes of possibly avoiding surgical removal in the future. Patient was lost to follow up for his 4-6 month repeat EGD. He is doing well in regards to GI concerns, he does have dark stools often, but is maintained on oral iron pills, he has no alarm symptoms. He will need EGD for surveillance of previously removed duodenal adenoma. I discussed with patient that it may be most beneficial for him to have this done by Dr. Rush Landmark in case recurrence of adenoma was present, so as to avoid him having to undergo 2 procedures back to back. Case discussed with Dr. Jenetta Downer who agrees that it would be more beneficial for patient to be referred back to Dr. Rush Landmark given his medical history and given that if recurrence of adenoma were present, he would likely still need to be seen again by Dr. Rush Landmark for EGD so that removal could be done. Patient verbalized understanding and is in agreement with this plan. Dr. Jenetta Downer has been in touch with Dr. Rush Landmark and their office will reach out to have patient scheduled for this.    PLAN:  Repeat EGD, will refer back to Dr. Rush Landmark for this 2. Continue PO iron supplementation   Follow Up: TBD  Reatha Sur L. Alver Sorrow, MSN, APRN, AGNP-C Adult-Gerontology Nurse Practitioner [facility]

## 2021-12-07 ENCOUNTER — Encounter (HOSPITAL_COMMUNITY)
Admission: RE | Admit: 2021-12-07 | Discharge: 2021-12-07 | Disposition: A | Discharge status: Home / Self Care | Payer: Medicare HMO | Source: Ambulatory Visit | Attending: Nephrology | Admitting: Nephrology

## 2021-12-07 ENCOUNTER — Encounter (HOSPITAL_COMMUNITY): Payer: Self-pay

## 2021-12-07 DIAGNOSIS — I5032 Chronic diastolic (congestive) heart failure: Secondary | ICD-10-CM | POA: Diagnosis not present

## 2021-12-07 DIAGNOSIS — R809 Proteinuria, unspecified: Secondary | ICD-10-CM | POA: Diagnosis not present

## 2021-12-07 DIAGNOSIS — I129 Hypertensive chronic kidney disease with stage 1 through stage 4 chronic kidney disease, or unspecified chronic kidney disease: Secondary | ICD-10-CM | POA: Diagnosis not present

## 2021-12-07 DIAGNOSIS — N184 Chronic kidney disease, stage 4 (severe): Secondary | ICD-10-CM | POA: Insufficient documentation

## 2021-12-07 DIAGNOSIS — E1122 Type 2 diabetes mellitus with diabetic chronic kidney disease: Secondary | ICD-10-CM | POA: Insufficient documentation

## 2021-12-07 DIAGNOSIS — E1129 Type 2 diabetes mellitus with other diabetic kidney complication: Secondary | ICD-10-CM | POA: Insufficient documentation

## 2021-12-07 DIAGNOSIS — E211 Secondary hyperparathyroidism, not elsewhere classified: Secondary | ICD-10-CM | POA: Diagnosis not present

## 2021-12-07 LAB — CBC WITH DIFFERENTIAL/PLATELET
Abs Immature Granulocytes: 0.01 10*3/uL (ref 0.00–0.07)
Basophils Absolute: 0 10*3/uL (ref 0.0–0.1)
Basophils Relative: 0 %
Eosinophils Absolute: 0.2 10*3/uL (ref 0.0–0.5)
Eosinophils Relative: 4 %
HCT: 31.4 % — ABNORMAL LOW (ref 39.0–52.0)
Hemoglobin: 10.4 g/dL — ABNORMAL LOW (ref 13.0–17.0)
Immature Granulocytes: 0 %
Lymphocytes Relative: 37 %
Lymphs Abs: 2 10*3/uL (ref 0.7–4.0)
MCH: 27.6 pg (ref 26.0–34.0)
MCHC: 33.1 g/dL (ref 30.0–36.0)
MCV: 83.3 fL (ref 80.0–100.0)
Monocytes Absolute: 0.4 10*3/uL (ref 0.1–1.0)
Monocytes Relative: 8 %
Neutro Abs: 2.7 10*3/uL (ref 1.7–7.7)
Neutrophils Relative %: 51 %
Platelets: 182 10*3/uL (ref 150–400)
RBC: 3.77 MIL/uL — ABNORMAL LOW (ref 4.22–5.81)
RDW: 14.5 % (ref 11.5–15.5)
WBC: 5.4 10*3/uL (ref 4.0–10.5)
nRBC: 0 % (ref 0.0–0.2)

## 2021-12-07 LAB — RENAL FUNCTION PANEL
Albumin: 4 g/dL (ref 3.5–5.0)
Anion gap: 7 (ref 5–15)
BUN: 30 mg/dL — ABNORMAL HIGH (ref 8–23)
CO2: 28 mmol/L (ref 22–32)
Calcium: 8.4 mg/dL — ABNORMAL LOW (ref 8.9–10.3)
Chloride: 99 mmol/L (ref 98–111)
Creatinine, Ser: 2.69 mg/dL — ABNORMAL HIGH (ref 0.61–1.24)
GFR, Estimated: 23 mL/min — ABNORMAL LOW (ref >60)
Glucose, Bld: 111 mg/dL — ABNORMAL HIGH (ref 70–99)
Phosphorus: 4.9 mg/dL — ABNORMAL HIGH (ref 2.5–4.6)
Potassium: 3.7 mmol/L (ref 3.5–5.1)
Sodium: 134 mmol/L — ABNORMAL LOW (ref 135–145)

## 2021-12-07 LAB — PROTEIN / CREATININE RATIO, URINE
Creatinine, Urine: 138.31 mg/dL
Protein Creatinine Ratio: 0.62 mg/mg{Cre} — ABNORMAL HIGH (ref 0.00–0.15)
Total Protein, Urine: 86 mg/dL

## 2021-12-07 LAB — POCT HEMOGLOBIN-HEMACUE: Hemoglobin: 10.5 g/dL — ABNORMAL LOW (ref 13.0–17.0)

## 2021-12-07 MED ORDER — EPOETIN ALFA-EPBX 3000 UNIT/ML IJ SOLN: 3000.0000 [IU] | Freq: Once | INTRAMUSCULAR | Status: DC

## 2021-12-08 ENCOUNTER — Telehealth: Payer: Self-pay

## 2021-12-08 NOTE — Telephone Encounter (Signed)
Thanks

## 2021-12-08 NOTE — Telephone Encounter (Signed)
Thanks for update. GM 

## 2021-12-08 NOTE — Telephone Encounter (Signed)
The recall was entered-not sure why he was not scheduled.  I will look for a date and get him in as soon as we have a spot.

## 2021-12-08 NOTE — Telephone Encounter (Signed)
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 Oliviya Gilkison, this patient should have had a an EGD recall for EGD/EMR.  Please set up a 75-minute EGD and have Endorotor available for this patient next available. Please let Dr. Jenetta Downer and I know when he is scheduled. Thanks. GM ----- Message ----- From: Harvel Quale, MD Sent: 12/07/2021   8:36 PM EST To: Irving Copas., MD Subject: surveillance after piecemeal EMR               Hi Alisa Graff you're doing well. You helped [place] with a large duodenal polypectomy a year ago, but he has not had his repeat EGD for surveillance after the EMR piecemeal resection. Would you schedule him for his repeat? My NP saw him recently.  Thanks,  [name], MD Gastroenterology and Hepatology [facility]

## 2021-12-09 ENCOUNTER — Telehealth: Payer: Self-pay

## 2021-12-09 ENCOUNTER — Other Ambulatory Visit: Payer: Self-pay

## 2021-12-09 DIAGNOSIS — N189 Chronic kidney disease, unspecified: Secondary | ICD-10-CM | POA: Diagnosis not present

## 2021-12-09 DIAGNOSIS — I129 Hypertensive chronic kidney disease with stage 1 through stage 4 chronic kidney disease, or unspecified chronic kidney disease: Secondary | ICD-10-CM | POA: Diagnosis not present

## 2021-12-09 DIAGNOSIS — R809 Proteinuria, unspecified: Secondary | ICD-10-CM | POA: Diagnosis not present

## 2021-12-09 DIAGNOSIS — D132 Benign neoplasm of duodenum: Secondary | ICD-10-CM

## 2021-12-09 DIAGNOSIS — I5032 Chronic diastolic (congestive) heart failure: Secondary | ICD-10-CM | POA: Diagnosis not present

## 2021-12-09 DIAGNOSIS — E1129 Type 2 diabetes mellitus with other diabetic kidney complication: Secondary | ICD-10-CM | POA: Diagnosis not present

## 2021-12-09 DIAGNOSIS — E211 Secondary hyperparathyroidism, not elsewhere classified: Secondary | ICD-10-CM | POA: Diagnosis not present

## 2021-12-09 DIAGNOSIS — E1122 Type 2 diabetes mellitus with diabetic chronic kidney disease: Secondary | ICD-10-CM | POA: Diagnosis not present

## 2021-12-09 DIAGNOSIS — D631 Anemia in chronic kidney disease: Secondary | ICD-10-CM | POA: Diagnosis not present

## 2021-12-09 NOTE — Telephone Encounter (Signed)
EUS EMR 75 min case set up or 02/07/22 at 730 am at [facility] with GM.

## 2021-12-09 NOTE — Telephone Encounter (Signed)
EUS/EMR scheduled, pt instructed and medications reviewed.  Patient instructions mailed to home.  Patient to call with any questions or concerns.

## 2021-12-16 DIAGNOSIS — G4733 Obstructive sleep apnea (adult) (pediatric): Secondary | ICD-10-CM | POA: Diagnosis not present

## 2021-12-19 ENCOUNTER — Other Ambulatory Visit (INDEPENDENT_AMBULATORY_CARE_PROVIDER_SITE_OTHER): Payer: Self-pay | Admitting: Gastroenterology

## 2021-12-19 DIAGNOSIS — D508 Other iron deficiency anemias: Secondary | ICD-10-CM

## 2021-12-20 DIAGNOSIS — H35371 Puckering of macula, right eye: Secondary | ICD-10-CM | POA: Diagnosis not present

## 2021-12-20 DIAGNOSIS — E119 Type 2 diabetes mellitus without complications: Secondary | ICD-10-CM | POA: Diagnosis not present

## 2021-12-20 DIAGNOSIS — H353131 Nonexudative age-related macular degeneration, bilateral, early dry stage: Secondary | ICD-10-CM | POA: Diagnosis not present

## 2021-12-20 DIAGNOSIS — H18413 Arcus senilis, bilateral: Secondary | ICD-10-CM | POA: Diagnosis not present

## 2021-12-21 ENCOUNTER — Encounter (HOSPITAL_COMMUNITY): Payer: Self-pay

## 2021-12-21 ENCOUNTER — Encounter (HOSPITAL_COMMUNITY)
Admission: RE | Admit: 2021-12-21 | Discharge: 2021-12-21 | Disposition: A | Discharge status: Home / Self Care | Payer: Medicare HMO | Source: Ambulatory Visit | Attending: Nephrology | Admitting: Nephrology

## 2021-12-21 DIAGNOSIS — J449 Chronic obstructive pulmonary disease, unspecified: Secondary | ICD-10-CM | POA: Diagnosis not present

## 2021-12-21 DIAGNOSIS — I5032 Chronic diastolic (congestive) heart failure: Secondary | ICD-10-CM | POA: Diagnosis not present

## 2021-12-21 DIAGNOSIS — I129 Hypertensive chronic kidney disease with stage 1 through stage 4 chronic kidney disease, or unspecified chronic kidney disease: Secondary | ICD-10-CM | POA: Diagnosis not present

## 2021-12-21 DIAGNOSIS — E1122 Type 2 diabetes mellitus with diabetic chronic kidney disease: Secondary | ICD-10-CM | POA: Diagnosis not present

## 2021-12-21 DIAGNOSIS — N184 Chronic kidney disease, stage 4 (severe): Secondary | ICD-10-CM | POA: Diagnosis not present

## 2021-12-21 DIAGNOSIS — R809 Proteinuria, unspecified: Secondary | ICD-10-CM | POA: Diagnosis not present

## 2021-12-21 DIAGNOSIS — I509 Heart failure, unspecified: Secondary | ICD-10-CM | POA: Diagnosis not present

## 2021-12-21 DIAGNOSIS — E211 Secondary hyperparathyroidism, not elsewhere classified: Secondary | ICD-10-CM | POA: Diagnosis not present

## 2021-12-21 DIAGNOSIS — E1129 Type 2 diabetes mellitus with other diabetic kidney complication: Secondary | ICD-10-CM | POA: Diagnosis not present

## 2021-12-21 LAB — RENAL FUNCTION PANEL
Albumin: 3.9 g/dL (ref 3.5–5.0)
Anion gap: 9 (ref 5–15)
BUN: 26 mg/dL — ABNORMAL HIGH (ref 8–23)
CO2: 27 mmol/L (ref 22–32)
Calcium: 8.6 mg/dL — ABNORMAL LOW (ref 8.9–10.3)
Chloride: 101 mmol/L (ref 98–111)
Creatinine, Ser: 2.28 mg/dL — ABNORMAL HIGH (ref 0.61–1.24)
GFR, Estimated: 29 mL/min — ABNORMAL LOW (ref >60)
Glucose, Bld: 124 mg/dL — ABNORMAL HIGH (ref 70–99)
Phosphorus: 4.2 mg/dL (ref 2.5–4.6)
Potassium: 3.3 mmol/L — ABNORMAL LOW (ref 3.5–5.1)
Sodium: 137 mmol/L (ref 135–145)

## 2021-12-21 LAB — POCT HEMOGLOBIN-HEMACUE: Hemoglobin: 10.3 g/dL — ABNORMAL LOW (ref 13.0–17.0)

## 2021-12-21 MED ORDER — EPOETIN ALFA-EPBX 3000 UNIT/ML IJ SOLN: 3000.0000 [IU] | Freq: Once | INTRAMUSCULAR | Status: DC

## 2021-12-24 IMAGING — US US RENAL
1 series · 14 of 25 positions shown · non-contrast
Comparison: CT 10/20/2020.  Ultrasound 02/12/2020.

CLINICAL DATA: Acute kidney failure.

EXAM:
RENAL / URINARY TRACT ULTRASOUND COMPLETE

[Series 1: us renal · 14 of 66 slices shown]
[im 1/66]
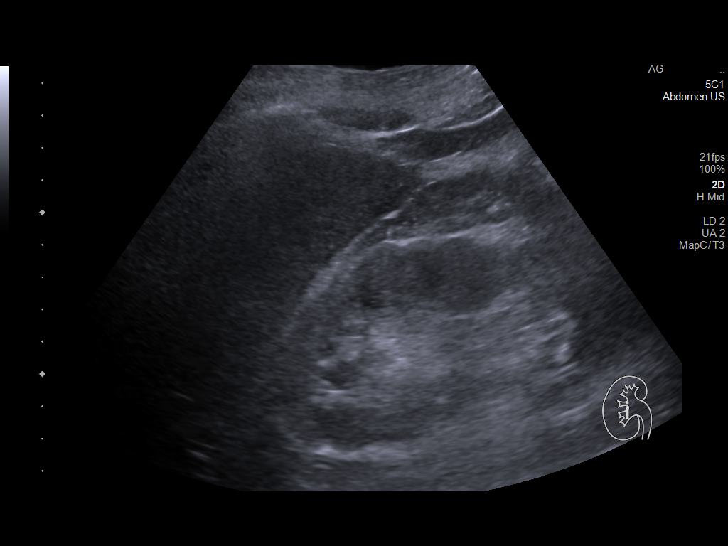
[im 6/66]
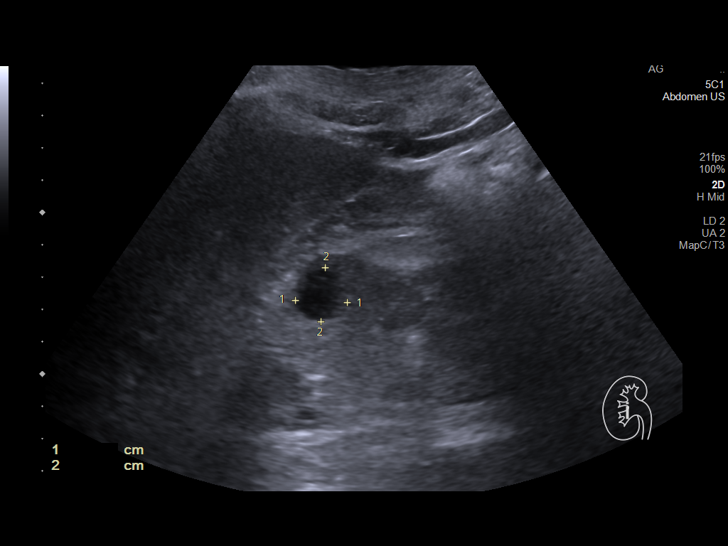
[im 11/66]
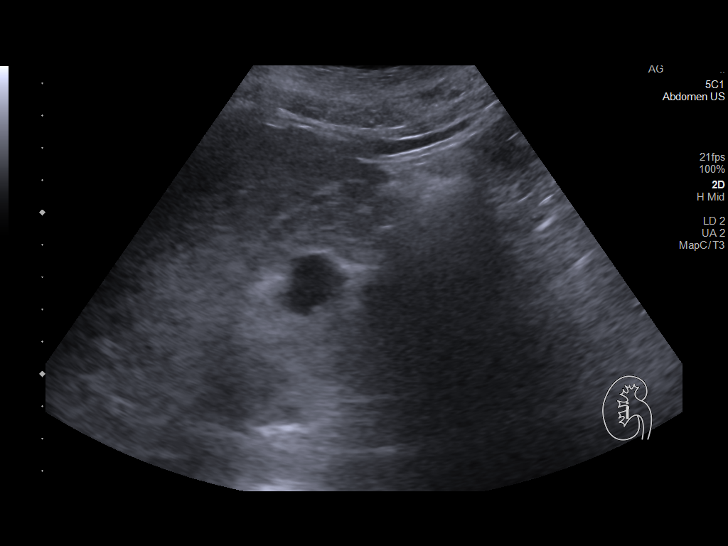
[im 17/66]
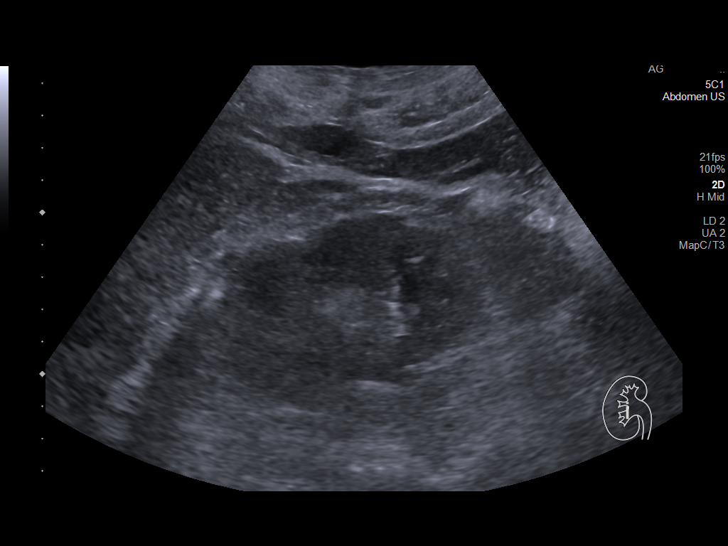
[im 22/66]
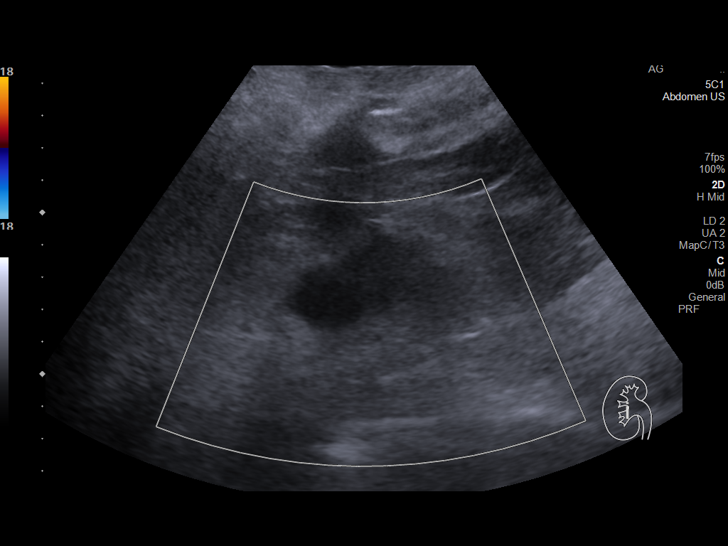
[im 25/66]
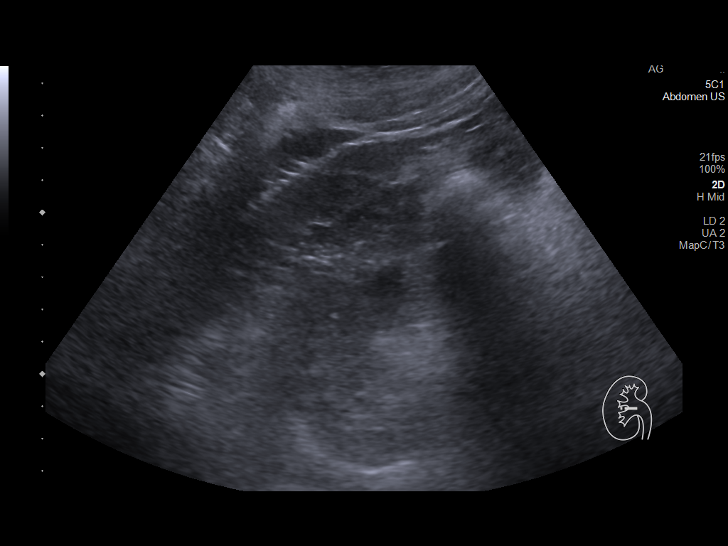
[im 30/66]
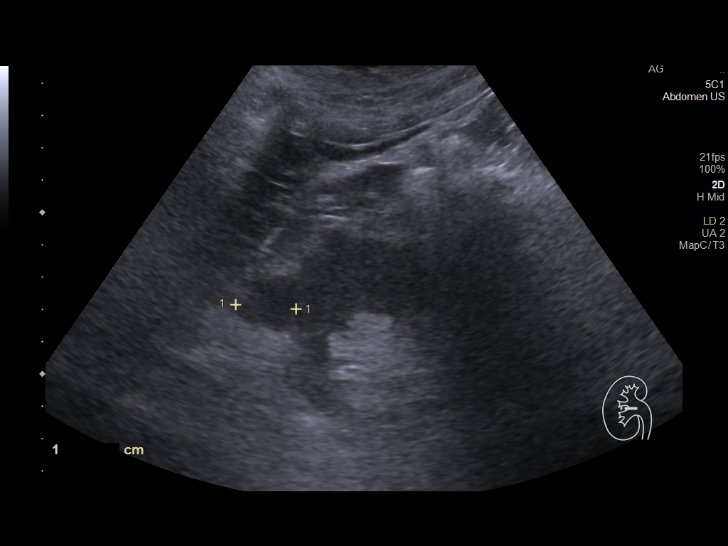
[im 36/66]
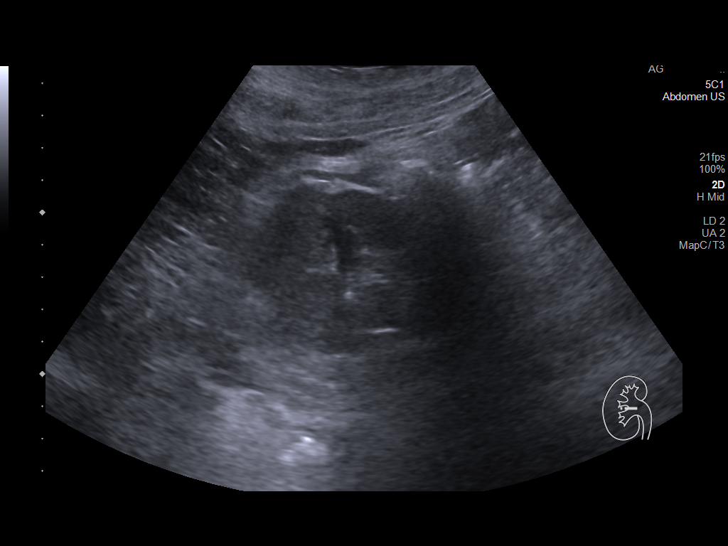
[im 41/66]
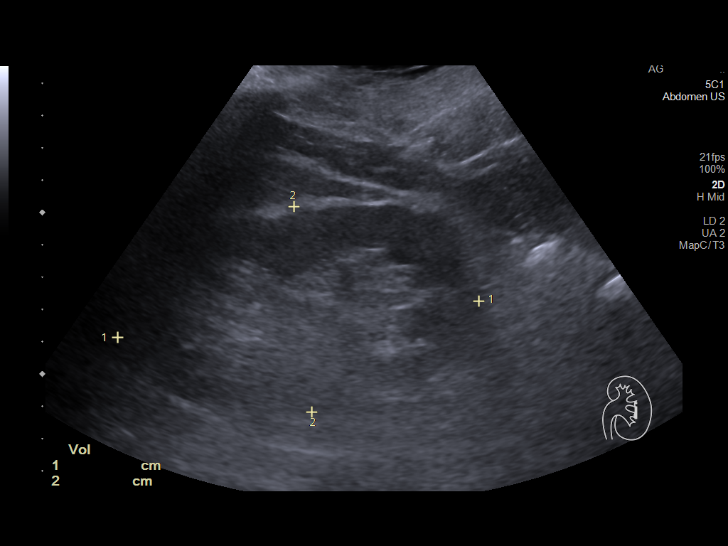
[im 44/66]
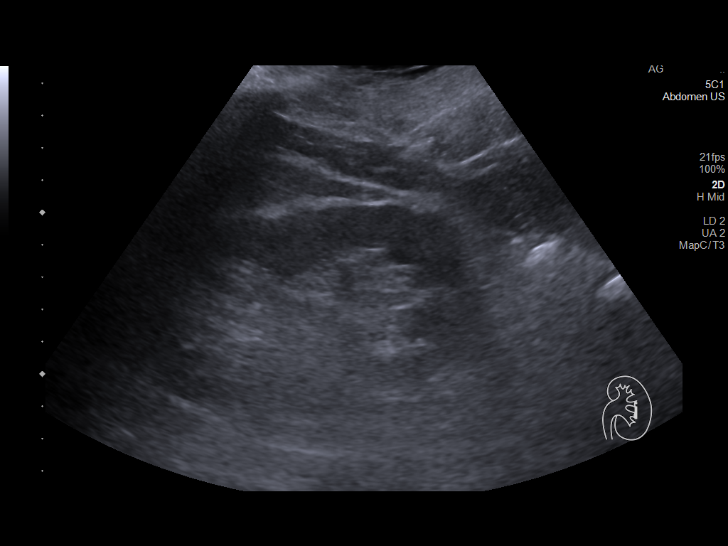
[im 49/66]
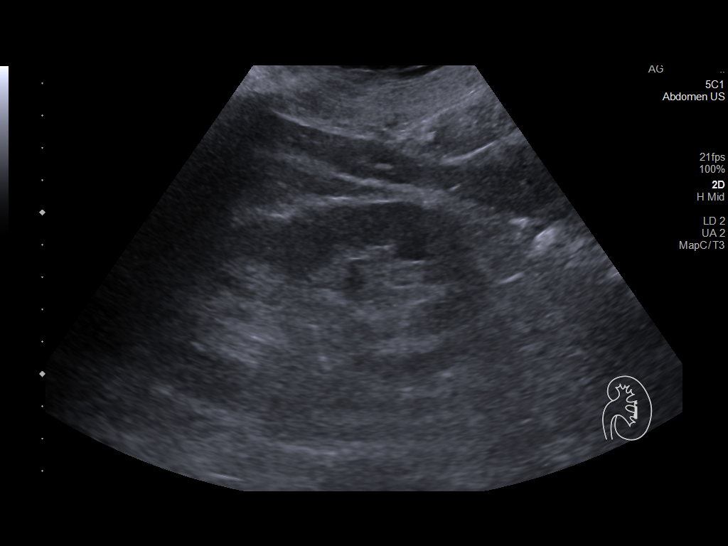
[im 55/66]
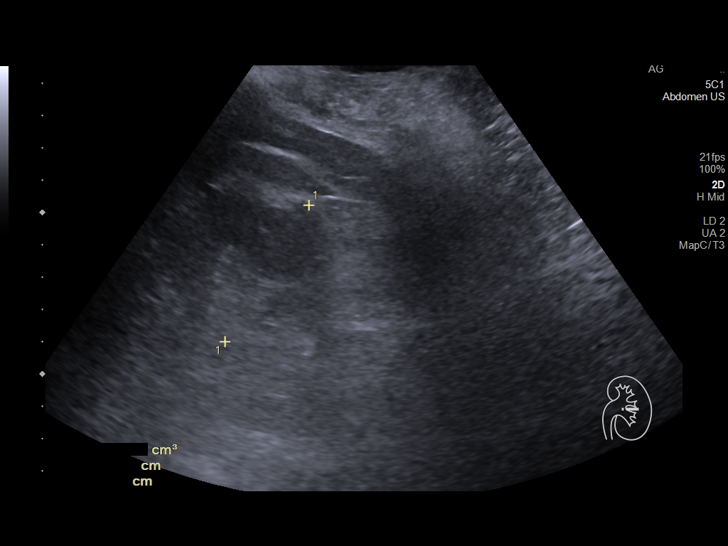
[im 60/66]
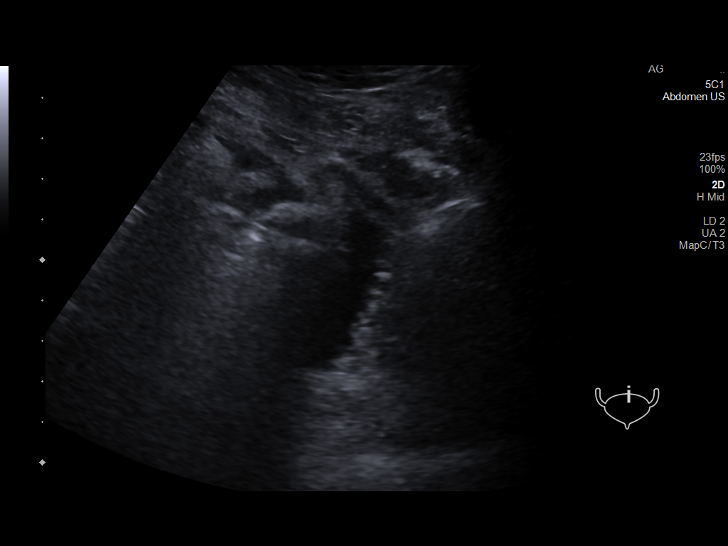
[im 66/66]
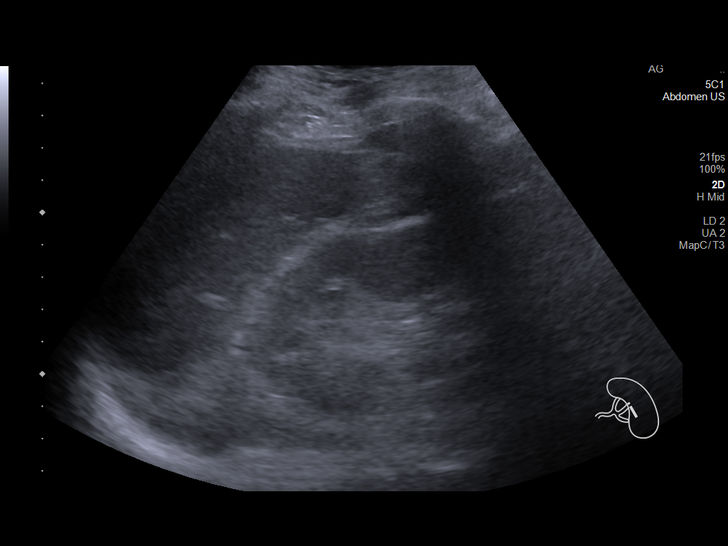

[14 of 25 positions shown; findings below may reference images not displayed]

FINDINGS: Right Kidney:

Renal measurements: 11.4 x 5.7 x 5.1 cm = volume: 173.3 mL.
Echogenicity within normal limits. Two simple cysts are noted, the
largest measures 2.6 cm. No hydronephrosis visualized.

Left Kidney:

Renal measurements: 11.2 x 6.4 x 5.0 cm = volume: 186.2 mL.
Echogenicity within normal limits. No mass or hydronephrosis
visualized.

Bladder:

Bladder is nondistended.  Ureteral jets not visualized.

Other:

None.
IMPRESSION: 1.  Two simple cysts right kidney, the largest measuring 2.6 cm.

2. Exam otherwise unremarkable. No hydronephrosis or bladder
distention

## 2021-12-28 DIAGNOSIS — I129 Hypertensive chronic kidney disease with stage 1 through stage 4 chronic kidney disease, or unspecified chronic kidney disease: Secondary | ICD-10-CM | POA: Diagnosis not present

## 2021-12-28 DIAGNOSIS — E1122 Type 2 diabetes mellitus with diabetic chronic kidney disease: Secondary | ICD-10-CM | POA: Diagnosis not present

## 2021-12-28 DIAGNOSIS — N184 Chronic kidney disease, stage 4 (severe): Secondary | ICD-10-CM | POA: Diagnosis not present

## 2022-01-04 ENCOUNTER — Ambulatory Visit (HOSPITAL_COMMUNITY): Payer: Medicare HMO

## 2022-01-06 ENCOUNTER — Ambulatory Visit (INDEPENDENT_AMBULATORY_CARE_PROVIDER_SITE_OTHER): Payer: Medicare HMO | Admitting: Gastroenterology

## 2022-01-14 DIAGNOSIS — E119 Type 2 diabetes mellitus without complications: Secondary | ICD-10-CM | POA: Diagnosis not present

## 2022-01-18 ENCOUNTER — Encounter (HOSPITAL_COMMUNITY): Payer: Self-pay

## 2022-01-18 ENCOUNTER — Encounter (HOSPITAL_COMMUNITY)
Admission: RE | Admit: 2022-01-18 | Discharge: 2022-01-18 | Disposition: A | Discharge status: Home / Self Care | Payer: Medicare HMO | Source: Ambulatory Visit | Attending: Nephrology | Admitting: Nephrology

## 2022-01-18 DIAGNOSIS — N184 Chronic kidney disease, stage 4 (severe): Secondary | ICD-10-CM | POA: Insufficient documentation

## 2022-01-18 DIAGNOSIS — D631 Anemia in chronic kidney disease: Secondary | ICD-10-CM | POA: Insufficient documentation

## 2022-01-18 LAB — POCT HEMOGLOBIN-HEMACUE: Hemoglobin: 10.3 g/dL — ABNORMAL LOW (ref 13.0–17.0)

## 2022-01-18 MED ORDER — EPOETIN ALFA-EPBX 3000 UNIT/ML IJ SOLN: 3000.0000 [IU] | Freq: Once | INTRAMUSCULAR | Status: DC

## 2022-01-28 ENCOUNTER — Other Ambulatory Visit: Payer: Self-pay

## 2022-01-28 ENCOUNTER — Encounter (HOSPITAL_COMMUNITY): Payer: Self-pay | Admitting: Gastroenterology

## 2022-01-28 DIAGNOSIS — I129 Hypertensive chronic kidney disease with stage 1 through stage 4 chronic kidney disease, or unspecified chronic kidney disease: Secondary | ICD-10-CM | POA: Diagnosis not present

## 2022-01-28 DIAGNOSIS — E7849 Other hyperlipidemia: Secondary | ICD-10-CM | POA: Diagnosis not present

## 2022-01-28 DIAGNOSIS — E1122 Type 2 diabetes mellitus with diabetic chronic kidney disease: Secondary | ICD-10-CM | POA: Diagnosis not present

## 2022-01-28 NOTE — Progress Notes (Signed)
Attempted to obtain medical history via telephone, unable to reach at this time. I left a voicemail to return pre surgical testing department's phone call.  

## 2022-02-01 ENCOUNTER — Encounter (HOSPITAL_COMMUNITY): Payer: Self-pay

## 2022-02-01 ENCOUNTER — Encounter (HOSPITAL_COMMUNITY)
Admission: RE | Admit: 2022-02-01 | Discharge: 2022-02-01 | Disposition: A | Discharge status: Home / Self Care | Payer: Medicare HMO | Source: Ambulatory Visit | Attending: Nephrology | Admitting: Nephrology

## 2022-02-01 DIAGNOSIS — N184 Chronic kidney disease, stage 4 (severe): Secondary | ICD-10-CM | POA: Insufficient documentation

## 2022-02-01 DIAGNOSIS — D631 Anemia in chronic kidney disease: Secondary | ICD-10-CM | POA: Insufficient documentation

## 2022-02-01 LAB — POCT HEMOGLOBIN-HEMACUE: Hemoglobin: 10.1 g/dL — ABNORMAL LOW (ref 13.0–17.0)

## 2022-02-01 MED ORDER — EPOETIN ALFA-EPBX 3000 UNIT/ML IJ SOLN: 3000.0000 [IU] | Freq: Once | INTRAMUSCULAR | Status: DC

## 2022-02-05 NOTE — Anesthesia Preprocedure Evaluation (Addendum)
Anesthesia Evaluation  ?Patient identified by MRN, date of birth, ID band ?Patient awake ? ? ? ?Reviewed: ?Allergy & Precautions, NPO status , Patient's Chart, lab work & pertinent test results ? ?History of Anesthesia Complications ?Negative for: history of anesthetic complications ? ?Airway ?Mallampati: II ? ? ? ? ? ? Dental ?no notable dental hx. ?(+) Dental Advisory Given, Edentulous Upper, Poor Dentition, Missing,  ?  ?Pulmonary ?sleep apnea , COPD,  COPD inhaler, former smoker, PE ?  ?Pulmonary exam normal ? ? ? ? ? ? ? Cardiovascular ?hypertension, Pt. on medications and Pt. on home beta blockers ? ?Rhythm:Irregular Rate:Normal ? ?Echo 12/20 Normal ef. Valves nl ?  ?Neuro/Psych ?negative neurological ROS ? negative psych ROS  ? GI/Hepatic ?GERD  ,(+)  ?  ? substance abuse ? alcohol use,   ?Endo/Other  ?negative endocrine ROSdiabetes ? Renal/GU ?negative Renal ROS  ? ?  ?Musculoskeletal ? ?(+) Arthritis , Osteoarthritis,   ? Abdominal ?  ?Peds ? Hematology ? ?(+) Blood dyscrasia, anemia ,   ?Anesthesia Other Findings ? ? Reproductive/Obstetrics ? ?  ? ? ? ? ? ? ? ? ? ? ? ? ? ?  ?  ? ? ? ? ? ? ? ?Anesthesia Physical ? ?Anesthesia Plan ? ?ASA: 3 ? ?Anesthesia Plan: MAC  ? ?Post-op Pain Management: Minimal or no pain anticipated  ? ?Induction: Intravenous ? ?PONV Risk Score and Plan: 1 and Ondansetron and Propofol infusion ? ?Airway Management Planned: Natural Airway ? ?Additional Equipment: None ? ?Intra-op Plan:  ? ?Post-operative Plan:  ? ?Informed Consent: I have reviewed the patients History and Physical, chart, labs and discussed the procedure including the risks, benefits and alternatives for the proposed anesthesia with the patient or authorized representative who has indicated his/her understanding and acceptance.  ? ? ? ?Dental advisory given ? ?Plan Discussed with: Anesthesiologist and CRNA ? ?Anesthesia Plan Comments:   ? ? ? ? ? ? ?Anesthesia Quick Evaluation ? ?

## 2022-02-07 ENCOUNTER — Encounter (HOSPITAL_COMMUNITY)
Admission: RE | Disposition: A | Discharge status: Home / Self Care | Payer: Self-pay | Source: Home / Self Care | Attending: Gastroenterology

## 2022-02-07 ENCOUNTER — Ambulatory Visit (HOSPITAL_COMMUNITY): Payer: Medicare HMO | Admitting: Anesthesiology

## 2022-02-07 ENCOUNTER — Other Ambulatory Visit: Payer: Self-pay

## 2022-02-07 ENCOUNTER — Encounter (HOSPITAL_COMMUNITY): Payer: Self-pay | Admitting: Gastroenterology

## 2022-02-07 ENCOUNTER — Ambulatory Visit (HOSPITAL_COMMUNITY)
Admission: RE | Admit: 2022-02-07 | Discharge: 2022-02-07 | Disposition: A | Discharge status: Home / Self Care | Payer: Medicare HMO | Attending: Gastroenterology | Admitting: Gastroenterology

## 2022-02-07 ENCOUNTER — Ambulatory Visit (HOSPITAL_BASED_OUTPATIENT_CLINIC_OR_DEPARTMENT_OTHER): Payer: Medicare HMO | Admitting: Anesthesiology

## 2022-02-07 DIAGNOSIS — Z87891 Personal history of nicotine dependence: Secondary | ICD-10-CM | POA: Insufficient documentation

## 2022-02-07 DIAGNOSIS — E1122 Type 2 diabetes mellitus with diabetic chronic kidney disease: Secondary | ICD-10-CM | POA: Diagnosis not present

## 2022-02-07 DIAGNOSIS — K449 Diaphragmatic hernia without obstruction or gangrene: Secondary | ICD-10-CM | POA: Insufficient documentation

## 2022-02-07 DIAGNOSIS — I10 Essential (primary) hypertension: Secondary | ICD-10-CM | POA: Diagnosis not present

## 2022-02-07 DIAGNOSIS — D132 Benign neoplasm of duodenum: Secondary | ICD-10-CM | POA: Insufficient documentation

## 2022-02-07 DIAGNOSIS — K317 Polyp of stomach and duodenum: Secondary | ICD-10-CM | POA: Diagnosis not present

## 2022-02-07 DIAGNOSIS — N189 Chronic kidney disease, unspecified: Secondary | ICD-10-CM | POA: Insufficient documentation

## 2022-02-07 DIAGNOSIS — K2289 Other specified disease of esophagus: Secondary | ICD-10-CM | POA: Insufficient documentation

## 2022-02-07 DIAGNOSIS — K3189 Other diseases of stomach and duodenum: Secondary | ICD-10-CM | POA: Diagnosis not present

## 2022-02-07 DIAGNOSIS — J449 Chronic obstructive pulmonary disease, unspecified: Secondary | ICD-10-CM | POA: Diagnosis not present

## 2022-02-07 DIAGNOSIS — K31811 Angiodysplasia of stomach and duodenum with bleeding: Secondary | ICD-10-CM | POA: Diagnosis not present

## 2022-02-07 DIAGNOSIS — K222 Esophageal obstruction: Secondary | ICD-10-CM | POA: Diagnosis not present

## 2022-02-07 DIAGNOSIS — I129 Hypertensive chronic kidney disease with stage 1 through stage 4 chronic kidney disease, or unspecified chronic kidney disease: Secondary | ICD-10-CM | POA: Insufficient documentation

## 2022-02-07 DIAGNOSIS — K259 Gastric ulcer, unspecified as acute or chronic, without hemorrhage or perforation: Secondary | ICD-10-CM | POA: Diagnosis not present

## 2022-02-07 DIAGNOSIS — K219 Gastro-esophageal reflux disease without esophagitis: Secondary | ICD-10-CM

## 2022-02-07 HISTORY — PX: HEMOSTASIS CLIP PLACEMENT: SHX6857

## 2022-02-07 HISTORY — PX: ESOPHAGOGASTRODUODENOSCOPY: SHX5428

## 2022-02-07 HISTORY — PX: SUBMUCOSAL LIFTING INJECTION: SHX6855

## 2022-02-07 HISTORY — PX: HOT HEMOSTASIS: SHX5433

## 2022-02-07 HISTORY — PX: BIOPSY: SHX5522

## 2022-02-07 HISTORY — PX: ENDOSCOPIC MUCOSAL RESECTION: SHX6839

## 2022-02-07 HISTORY — DX: Type 2 diabetes mellitus without complications: E11.9

## 2022-02-07 LAB — GLUCOSE, CAPILLARY: Glucose-Capillary: 108 mg/dL — ABNORMAL HIGH (ref 70–99)

## 2022-02-07 SURGERY — RESECTION, MUCOSAL LESION, GI TRACT, ENDOSCOPIC
Anesthesia: Monitor Anesthesia Care

## 2022-02-07 MED ORDER — PROPOFOL 500 MG/50ML IV EMUL
INTRAVENOUS | Status: AC
  Filled 2022-02-07: qty 50

## 2022-02-07 MED ORDER — PANTOPRAZOLE SODIUM 40 MG PO TBEC: 40.0000 mg | DELAYED_RELEASE_TABLET | Freq: Two times a day (BID) | ORAL | 12 refills | Status: DC

## 2022-02-07 MED ORDER — LIDOCAINE 2% (20 MG/ML) 5 ML SYRINGE
INTRAMUSCULAR | Status: DC | PRN
  Administered 2022-02-07: 100 mg via INTRAVENOUS

## 2022-02-07 MED ORDER — PROPOFOL 10 MG/ML IV BOLUS
INTRAVENOUS | Status: DC | PRN
  Administered 2022-02-07: 150 ug/kg/min via INTRAVENOUS

## 2022-02-07 MED ORDER — NON FORMULARY: Status: DC | PRN

## 2022-02-07 MED ORDER — SODIUM CHLORIDE 0.9 % IV SOLN
INTRAVENOUS | Status: DC
  Administered 2022-02-07: 500 mL via INTRAVENOUS

## 2022-02-07 MED ORDER — ONDANSETRON HCL 4 MG/2ML IJ SOLN
INTRAMUSCULAR | Status: DC | PRN
  Administered 2022-02-07: 4 mg via INTRAVENOUS

## 2022-02-07 MED ORDER — PROPOFOL 500 MG/50ML IV EMUL
INTRAVENOUS | Status: DC | PRN
  Administered 2022-02-07: 20 mg via INTRAVENOUS

## 2022-02-07 NOTE — Op Note (Addendum)
[facility] ?Patient Name: Andrew Adams ?Procedure Date: 02/07/2022 ?MRN: [id number] ?Attending MD: Justice Britain , MD ?Date of Birth: Nov 06, 1945 ?CSN: [id number] ?Age: 76 ?Admit Type: Outpatient ?Procedure:                Upper GI endoscopy ?Indications:              Follow-up of polyps in the duodenum, Follow-up of  ?                          gastric polyps ?Providers:                Justice Britain, MD, Kary Kos RN, RN,  ?                          Despina Pole, Technician, Virgia Land, CRNA ?Referring MD:             [name], Grace Bushy. Luciana Axe PA, PA ?Medicines:                Monitored Anesthesia Care ?Complications:            No immediate complications. ?Estimated Blood Loss:     Estimated blood loss was minimal. ?Procedure:                Pre-Anesthesia Assessment: ?                          - Prior to the procedure, a History and Physical  ?                          was performed, and patient medications and  ?                          allergies were reviewed. The patient's tolerance of  ?                          previous anesthesia was also reviewed. The risks  ?                          and benefits of the procedure and the sedation  ?                          options and risks were discussed with the patient.  ?                          All questions were answered, and informed consent  ?                          was obtained. Prior Anticoagulants: The patient has  ?                          taken no previous anticoagulant or antiplatelet  ?                          agents. ASA Grade Assessment: III - A patient with  ?  severe systemic disease. After reviewing the risks  ?                          and benefits, the patient was deemed in  ?                          satisfactory condition to undergo the procedure. ?                          After obtaining informed consent, the endoscope was  ?                          passed under direct  vision. Throughout the  ?                          procedure, the patient's blood pressure, pulse, and  ?                          oxygen saturations were monitored continuously. The  ?                          [id number]-HQ190L ([id number]) Olympus colonoscope was  ?                          introduced through the mouth, and advanced to the  ?                          jejunum. After obtaining informed consent, the  ?                          endoscope was passed under direct vision.  ?                          Throughout the procedure, the patient's blood  ?                          pressure, pulse, and oxygen saturations were  ?                          monitored continuously.The upper EUS was  ?                          accomplished without difficulty. The patient  ?                          tolerated the procedure. ?Scope In: ?Scope Out: ?Findings: ?     No gross lesions were noted in the proximal esophagus, in the mid  ?     esophagus and in the distal esophagus. ?     A non-obstructing Schatzki ring was found at the gastroesophageal  ?     junction. ?     The Z-line was irregular and was found 41 cm from the incisors. ?     A 2 cm hiatal hernia was present. ?     A single small angioectasia with no bleeding was found in  the gastric  ?     fundus. Fulguration to ablate the lesion to prevent bleeding by argon  ?     plasma was successful. ?     Multiple 8 to 16 mm semi-sessile polyps with no bleeding and two had  ?     stigmata of recent bleeding were found in the entire examined stomach.  ?     Two of these polyps (that had stigmata of recent oozing/bleeding) were  ?     removed with a lift and cut technique using a cold snare. Resection and  ?     retrieval were complete. To prevent bleeding after the polypectomy, two  ?     hemostatic clips were successfully placed (MR conditional) - one on each  ?     site. There was no bleeding at the end of the procedure. ?     A single 10 mm submucosal nodule was found in the  duodenal bulb.  ?     Tunneled biopsies were taken with a cold forceps for histology to rule  ?     out NET. ?     A medium post mucosectomy scar was found in the second portion of the  ?     duodenum just distal to the tattoo. There was evidence of recurrent  ?     polyp tissue however (approximately 22 mm in size). Preparations were  ?     made for attempt at repeat mucosal resection. NBI imaging and  ?     White-light endoscopy was done to demarcate the borders of the lesion.  ?     Everlift was injected to raise the lesion. Piecemeal mucosal resection  ?     using a snare was performed. Resection and retrieval were incomplete.  ?     Fulguration to ablate the lesion remnants by Avulsion biopsy forceps was  ?     successful. To prevent bleeding after mucosal resection/avulsion, four  ?     hemostatic clips were successfully placed (MR conditional). There was no  ?     bleeding at the end of the procedure. ?     No other gross lesions were noted in the duodenal bulb, in the first  ?     portion of the duodenum, in the second portion of the duodenum, in the  ?     third portion of the duodenum and in the fourth portion of the duodenum. ?Impression:               - No gross lesions in esophagus. Z-line irregular,  ?                          41 cm from the incisors. Non-obstructing Schatzki  ?                          ring. ?                          - 2 cm hiatal hernia. ?                          - A single small angioectasia with no bleeding was  ?  found in the gastric fundus. Fulguration to ablate  ?                          the lesion to prevent bleeding by argon plasma was  ?                          successful. ?                          - Multiple gastric polyps. 2 with stigmata of  ?                          recent bleeding/oozing were resected and retrieved.  ?                          Clips (MR conditional) were placed. ?                          - Submucosal nodule found in the  duodenum bulb.  ?                          Biopsied to rule out NET. ?                          - Duodenal scar with evidence of some polyp  ?                          recurrence in D2. Piecemeal EMR and then Avulsion  ?                          performed for resection as noted above. Clips (MR  ?                          conditional) were placed. ?                          - No other gross lesions in the duodenal bulb, in  ?                          the first portion of the duodenum, in the second  ?                          portion of the duodenum, in the third portion of  ?                          the duodenum and in the fourth portion of the  ?                          duodenum. ?Moderate Sedation: ?     Not Applicable - Patient had care per Anesthesia. ?Recommendation:           - The patient will be observed post-procedure,  ?  until all discharge criteria are met. ?                          - Patient has a contact number available for  ?                          emergencies. The signs and symptoms of potential  ?                          delayed complications were discussed with the  ?                          patient. Return to normal activities tomorrow.  ?                          Written discharge instructions were provided to the  ?                          patient. ?                          - Patient has a contact number available for  ?                          emergencies. The signs and symptoms of potential  ?                          delayed complications were discussed with the  ?                          patient. Return to normal activities tomorrow.  ?                          Written discharge instructions were provided to the  ?                          patient. ?                          - Soft diet today. ?                          - Increase PPI to 40 mg twice daily for 1 month.  ?                          Then may decrease back to once daily thereafter as  ?                           he is already taking if that manages his symptoms. ?                          - Observe patient's clinical course. ?                          - Await pathology results. ?

## 2022-02-07 NOTE — Anesthesia Postprocedure Evaluation (Signed)
Anesthesia Post Note ? ?Patient: Andrew Adams ? ?Procedure(s) Performed: ENDOSCOPIC MUCOSAL RESECTION ?ESOPHAGOGASTRODUODENOSCOPY (EGD) ?SUBMUCOSAL LIFTING INJECTION ?HEMOSTASIS CLIP PLACEMENT ?BIOPSY ?HOT HEMOSTASIS (ARGON PLASMA COAGULATION/BICAP) ? ?  ? ?Patient location during evaluation: PACU ?Anesthesia Type: MAC ?Level of consciousness: awake and alert ?Pain management: pain level controlled ?Vital Signs Assessment: post-procedure vital signs reviewed and stable ?Respiratory status: spontaneous breathing, nonlabored ventilation, respiratory function stable and patient connected to nasal cannula oxygen ?Cardiovascular status: stable and blood pressure returned to baseline ?Postop Assessment: no apparent nausea or vomiting ?Anesthetic complications: no ? ? ?No notable events documented. ? ?Last Vitals:  ?Vitals:  ? 02/07/22 0900 02/07/22 0921  ?BP: 110/67 134/79  ?Pulse: 78 73  ?Resp: 19 16  ?Temp:    ?SpO2: (!) 86% 96%  ?  ?Last Pain:  ?Vitals:  ? 02/07/22 0921  ?TempSrc:   ?PainSc: 0-No pain  ? ? ?  ?  ?  ?  ?  ?  ? ?Kiyoko Mcguirt ? ? ? ? ?

## 2022-02-07 NOTE — Discharge Instructions (Signed)
YOU HAD AN ENDOSCOPIC PROCEDURE TODAY: Refer to the procedure report and other information in the discharge instructions given to you for any specific questions about what was found during the examination. If this information does not answer your questions, please call [place] office at [phone] to clarify.   YOU SHOULD EXPECT: Some feelings of bloating in the abdomen. Passage of more gas than usual. Walking can help get rid of the air that was put into your GI tract during the procedure and reduce the bloating. If you had a lower endoscopy (such as a colonoscopy or flexible sigmoidoscopy) you may notice spotting of blood in your stool or on the toilet paper. Some abdominal soreness may be present for a day or two, also.  DIET: Your first meal following the procedure should be a light meal and then it is ok to progress to your normal diet. A half-sandwich or bowl of soup is an example of a good first meal. Heavy or fried foods are harder to digest and may make you feel nauseous or bloated. Drink plenty of fluids but you should avoid alcoholic beverages for 24 hours. If you had a esophageal dilation, please see attached instructions for diet.    ACTIVITY: Your care partner should take you home directly after the procedure. You should plan to take it easy, moving slowly for the rest of the day. You can resume normal activity the day after the procedure however YOU SHOULD NOT DRIVE, use power tools, machinery or perform tasks that involve climbing or major physical exertion for 24 hours (because of the sedation medicines used during the test).   SYMPTOMS TO REPORT IMMEDIATELY: A gastroenterologist can be reached at any hour. Please call [phone]  for any of the following symptoms:   Following upper endoscopy (EGD, EUS, ERCP, esophageal dilation) Vomiting of blood or coffee ground material  New, significant abdominal pain  New, significant chest pain or pain under the shoulder blades  Painful or  persistently difficult swallowing  New shortness of breath  Black, tarry-looking or red, bloody stools  FOLLOW UP:  If any biopsies were taken you will be contacted by phone or by letter within the next 1-3 weeks. Call [phone]  if you have not heard about the biopsies in 3 weeks.  Please also call with any specific questions about appointments or follow up tests.  

## 2022-02-07 NOTE — Anesthesia Procedure Notes (Signed)
Procedure Name: [place] ?Date/Time: 02/07/2022 7:39 AM ?Performed by: Maxwell Caul, CRNA ?Pre-anesthesia Checklist: Patient identified, Emergency Drugs available, Suction available and Patient being monitored ?Oxygen Delivery Method: Simple face mask ? ? ? ? ?

## 2022-02-07 NOTE — H&P (Signed)
? ?GASTROENTEROLOGY PROCEDURE H&P NOTE  ? ?Primary Care Physician: ?Lavella Lemons, PA ? ?HPI: ?Andrew Adams is a 76 y.o. male who presents for EGD for follow up of Duodenal TVA s/p piecemeal EMR in 2022 and another duodenal adenoma and history of gastric hyperplastic polyps. ? ?Past Medical History:  ?Diagnosis Date  ? Anemia   ? Borderline diabetic   ? Chronic kidney disease   ? Diabetes mellitus without complication ([place])   ? 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 GERD (gastroesophageal reflux disease)   ? Gout   ? Gouty arthritis   ? H/O small bowel obstruction   ? History of back surgery   ? Hx of adenomatous colonic polyps   ? 2021  ? Hyperlipidemia   ? Hypertension   ? PE (pulmonary embolism)   ? Sleep apnea   ? ?Past Surgical History:  ?Procedure Laterality Date  ? ABDOMINAL SURGERY  2103  ? Smal bowel obstruction   ? ABDOMINAL SURGERY  1981  ? Perforated large intestine  ? back    ? Lower back  ? BIOPSY  08/28/2020  ? Procedure: BIOPSY;  Surgeon: Harvel Quale, MD;  Location: [facility];  Service: Gastroenterology;;  ? CATARACT EXTRACTION W/PHACO Right 01/06/2020  ? Procedure: CATARACT EXTRACTION PHACO AND INTRAOCULAR LENS PLACEMENT (IOC) (CDE: 6.48);  Surgeon: [name], MD;  Location: [facility];  Service: Ophthalmology;  Laterality: Right;  ? CATARACT EXTRACTION W/PHACO Left 01/20/2020  ? Procedure: CATARACT EXTRACTION PHACO AND INTRAOCULAR LENS PLACEMENT (IOC);  Surgeon: [name], MD;  Location: [facility];  Service: Ophthalmology;  Laterality: Left;  CDE: 5.86  ? COLONOSCOPY WITH PROPOFOL N/A 08/28/2020  ? Procedure: COLONOSCOPY WITH PROPOFOL;  Surgeon: Harvel Quale, MD;  Location: [facility];  Service: Gastroenterology;  Laterality: N/A;  230  ? ESOPHAGOGASTRODUODENOSCOPY (EGD) WITH PROPOFOL N/A 08/28/2020  ? Procedure: ESOPHAGOGASTRODUODENOSCOPY (EGD) WITH PROPOFOL;  Surgeon: Harvel Quale, MD;  [facility]: [facility];  Service: Gastroenterology;  Laterality:  N/A;  ? ESOPHAGOGASTRODUODENOSCOPY (EGD) WITH PROPOFOL N/A 12/14/2020  ? Procedure: ESOPHAGOGASTRODUODENOSCOPY (EGD) WITH PROPOFOL;  Surgeon: Rush Landmark Telford Nab., MD;  Location: [facility];  Service: Gastroenterology;  Laterality: N/A;  ? GIVENS CAPSULE STUDY N/A 09/10/2020  ? Procedure: GIVENS CAPSULE STUDY;  Surgeon: Harvel Quale, MD;  Location: [facility];  Service: Gastroenterology;  Laterality: N/A;  730  ? HEMOSTASIS CLIP PLACEMENT  12/14/2020  ? Procedure: HEMOSTASIS CLIP PLACEMENT;  Surgeon: Irving Copas., MD;  Location: [facility];  Service: Gastroenterology;;  ? HEMOSTASIS CONTROL  12/14/2020  ? Procedure: HEMOSTASIS CONTROL;  Surgeon: Rush Landmark Telford Nab., MD;  Location: [facility];  Service: Gastroenterology;;  ? HERNIA REPAIR    ? incisional  ? POLYPECTOMY  08/28/2020  ? Procedure: POLYPECTOMY;  Surgeon: Harvel Quale, MD;  Location: [facility];  Service: Gastroenterology;;  ? POLYPECTOMY  12/14/2020  ? Procedure: POLYPECTOMY;  Surgeon: Irving Copas., MD;  Location: [facility];  Service: Gastroenterology;;  ? Clide Deutscher  12/14/2020  ? Procedure: SCLEROTHERAPY;  Surgeon: Rush Landmark Telford Nab., MD;  Location: [facility];  Service: Gastroenterology;;  ? SUBMUCOSAL TATTOO INJECTION  12/14/2020  ? Procedure: SUBMUCOSAL TATTOO INJECTION;  Surgeon: Irving Copas., MD;  Location: [facility];  Service: Gastroenterology;;  ? ?Current Facility-Administered Medications  ?Medication Dose Route Frequency Provider Last Rate Last Admin  ? 0.9 %  sodium chloride infusion   Intravenous Continuous Mansouraty, Telford Nab., MD 20 mL/hr at 02/07/22 0708 500 mL  at 02/07/22 0708  ? ? ?Current Facility-Administered Medications:  ?  0.9 %  sodium chloride infusion, , Intravenous, Continuous, Mansouraty, Telford Nab., MD, Last Rate: 20 mL/hr at 02/07/22 0708, 500 mL at 02/07/22 0708 ?No Known Allergies ?Family History  ?Problem Relation Age of Onset  ?  Hypertension Sister   ? Diabetes Sister   ? Diabetes Sister   ? Hypertension Mother   ? Diabetes Mother   ? Hypertension Father   ? Ulcers Father   ? Colon cancer Neg Hx   ? Esophageal cancer Neg Hx   ? Stomach cancer Neg Hx   ? Inflammatory bowel disease Neg Hx   ? Liver disease Neg Hx   ? Pancreatic cancer Neg Hx   ? Rectal cancer Neg Hx   ? ?Social History  ? ?Socioeconomic History  ? Marital status: Married  ?  Spouse name: Not on file  ? Number of children: 1  ? Years of education: Not on file  ? Highest education level: Not on file  ?Occupational History  ? Occupation: Finisher  ?Tobacco Use  ? Smoking status: Former  ?  Packs/day: 1.00  ?  Years: 38.00  ?  Pack years: 38.00  ?  Types: Cigarettes  ?  Quit date: 02/07/1995  ?  Years since quitting: 27.0  ? Smokeless tobacco: Never  ?Vaping Use  ? Vaping Use: Never used  ?Substance and Sexual Activity  ? Alcohol use: Yes  ?  Comment: occasional   ? Drug use: No  ? Sexual activity: Yes  ?Other Topics Concern  ? Not on file  ?Social History Narrative  ? Lives with wife.    ? ?Social Determinants of Health  ? ?Financial Resource Strain: Not on file  ?Food Insecurity: Not on file  ?Transportation Needs: Not on file  ?Physical Activity: Not on file  ?Stress: Not on file  ?Social Connections: Not on file  ?Intimate Partner Violence: Not on file  ? ? ?Physical Exam: ?Today's Vitals  ? 01/28/22 1310 02/07/22 0637  ?BP:  (!) 168/94  ?Pulse:  80  ?Resp:  (!) 22  ?Temp:  98.2 ?F (36.8 ?C)  ?TempSrc:  Oral  ?Weight: 98 kg 98 kg  ?Height: '5\' 9"'$  (1.753 m) '5\' 9"'$  (1.753 m)  ?PainSc:  0-No pain  ? ?Body mass index is 31.91 kg/m?. ?GEN: NAD ?EYE: Sclerae anicteric ?ENT: MMM ?CV: Non-tachycardic ?GI: Soft, NT/ND ?NEURO:  Alert & Oriented x 3 ? ?Lab Results: ?No results for input(s): WBC, HGB, HCT, PLT in the last 72 hours. ?BMET ?No results for input(s): NA, K, CL, CO2, GLUCOSE, BUN, CREATININE, CALCIUM in the last 72 hours. ?LFT ?No results for input(s): PROT, ALBUMIN, AST, ALT,  ALKPHOS, BILITOT, BILIDIR, IBILI in the last 72 hours. ?PT/INR ?No results for input(s): LABPROT, INR in the last 72 hours. ? ? ?Impression / Plan: ?This is a 76 y.o.male who presents for EGD for follow up of Duodenal TVA s/p piecemeal EMR in 2022 and another duodenal adenoma and history of gastric hyperplastic polyps. ? ?The risks and benefits of endoscopic evaluation/treatment were discussed with the patient and/or family; these include but are not limited to the risk of perforation, infection, bleeding, missed lesions, lack of diagnosis, severe illness requiring hospitalization, as well as anesthesia and sedation related illnesses.  The patient's history has been reviewed, patient examined, no change in status, and deemed stable for procedure.  The patient and/or family is agreeable to proceed.  ? ? ?Justice Britain, MD ?  [facility] ?Advanced Endoscopy ?Office # [phone] ? ?

## 2022-02-07 NOTE — Transfer of Care (Signed)
Immediate Anesthesia Transfer of Care Note ? ?Patient: Andrew Adams ? ?Procedure(s) Performed: ENDOSCOPIC MUCOSAL RESECTION ?ESOPHAGOGASTRODUODENOSCOPY (EGD) ?SUBMUCOSAL LIFTING INJECTION ?HEMOSTASIS CLIP PLACEMENT ?BIOPSY ?HOT HEMOSTASIS (ARGON PLASMA COAGULATION/BICAP) ? ?Patient Location: PACU and Endoscopy Unit ? ?Anesthesia Type:MAC ? ?Level of Consciousness: awake, alert  and oriented ? ?Airway & Oxygen Therapy: Patient Spontanous Breathing and Patient connected to face mask oxygen ? ?Post-op Assessment: Report given to RN and Post -op Vital signs reviewed and stable ? ?Post vital signs: Reviewed and stable ? ?Last Vitals:  ?Vitals Value Taken Time  ?BP    ?Temp    ?Pulse 78 02/07/22 0853  ?Resp 16 02/07/22 0853  ?SpO2 94 % 02/07/22 0853  ?Vitals shown include unvalidated device data. ? ?Last Pain:  ?Vitals:  ? 02/07/22 0637  ?TempSrc: Oral  ?PainSc: 0-No pain  ?   ? ?  ? ?Complications: No notable events documented. ?

## 2022-02-08 ENCOUNTER — Encounter: Payer: Self-pay | Admitting: Gastroenterology

## 2022-02-08 ENCOUNTER — Encounter (HOSPITAL_COMMUNITY): Payer: Self-pay | Admitting: Gastroenterology

## 2022-02-08 LAB — SURGICAL PATHOLOGY

## 2022-02-16 ENCOUNTER — Encounter (HOSPITAL_COMMUNITY): Payer: Self-pay

## 2022-02-16 ENCOUNTER — Ambulatory Visit (HOSPITAL_COMMUNITY)
Admission: RE | Admit: 2022-02-16 | Discharge: 2022-02-16 | Disposition: A | Discharge status: Home / Self Care | Payer: Medicare HMO | Source: Ambulatory Visit | Attending: Pulmonary Disease | Admitting: Pulmonary Disease

## 2022-02-16 DIAGNOSIS — R911 Solitary pulmonary nodule: Secondary | ICD-10-CM | POA: Insufficient documentation

## 2022-02-16 DIAGNOSIS — I700 Atherosclerosis of aorta: Secondary | ICD-10-CM | POA: Diagnosis not present

## 2022-02-16 DIAGNOSIS — R918 Other nonspecific abnormal finding of lung field: Secondary | ICD-10-CM | POA: Diagnosis not present

## 2022-02-27 DIAGNOSIS — N184 Chronic kidney disease, stage 4 (severe): Secondary | ICD-10-CM | POA: Diagnosis not present

## 2022-02-27 DIAGNOSIS — E1122 Type 2 diabetes mellitus with diabetic chronic kidney disease: Secondary | ICD-10-CM | POA: Diagnosis not present

## 2022-02-27 DIAGNOSIS — I129 Hypertensive chronic kidney disease with stage 1 through stage 4 chronic kidney disease, or unspecified chronic kidney disease: Secondary | ICD-10-CM | POA: Diagnosis not present

## 2022-02-27 DIAGNOSIS — E7849 Other hyperlipidemia: Secondary | ICD-10-CM | POA: Diagnosis not present

## 2022-02-28 ENCOUNTER — Other Ambulatory Visit (HOSPITAL_COMMUNITY): Payer: Self-pay | Admitting: *Deleted

## 2022-02-28 DIAGNOSIS — M545 Low back pain, unspecified: Secondary | ICD-10-CM | POA: Diagnosis not present

## 2022-02-28 DIAGNOSIS — Z6832 Body mass index (BMI) 32.0-32.9, adult: Secondary | ICD-10-CM | POA: Diagnosis not present

## 2022-02-28 DIAGNOSIS — M25552 Pain in left hip: Secondary | ICD-10-CM | POA: Diagnosis not present

## 2022-02-28 DIAGNOSIS — I10 Essential (primary) hypertension: Secondary | ICD-10-CM | POA: Diagnosis not present

## 2022-03-01 ENCOUNTER — Encounter (HOSPITAL_COMMUNITY)
Admission: RE | Admit: 2022-03-01 | Discharge: 2022-03-01 | Disposition: A | Discharge status: Home / Self Care | Payer: Medicare HMO | Source: Ambulatory Visit | Attending: Nephrology | Admitting: Nephrology

## 2022-03-01 ENCOUNTER — Encounter (HOSPITAL_COMMUNITY): Payer: Self-pay

## 2022-03-01 DIAGNOSIS — E1129 Type 2 diabetes mellitus with other diabetic kidney complication: Secondary | ICD-10-CM | POA: Insufficient documentation

## 2022-03-01 DIAGNOSIS — D631 Anemia in chronic kidney disease: Secondary | ICD-10-CM | POA: Diagnosis not present

## 2022-03-01 DIAGNOSIS — E211 Secondary hyperparathyroidism, not elsewhere classified: Secondary | ICD-10-CM | POA: Diagnosis not present

## 2022-03-01 DIAGNOSIS — I129 Hypertensive chronic kidney disease with stage 1 through stage 4 chronic kidney disease, or unspecified chronic kidney disease: Secondary | ICD-10-CM | POA: Diagnosis not present

## 2022-03-01 DIAGNOSIS — R809 Proteinuria, unspecified: Secondary | ICD-10-CM | POA: Diagnosis not present

## 2022-03-01 DIAGNOSIS — E1122 Type 2 diabetes mellitus with diabetic chronic kidney disease: Secondary | ICD-10-CM | POA: Insufficient documentation

## 2022-03-01 DIAGNOSIS — I5032 Chronic diastolic (congestive) heart failure: Secondary | ICD-10-CM | POA: Diagnosis not present

## 2022-03-01 DIAGNOSIS — N184 Chronic kidney disease, stage 4 (severe): Secondary | ICD-10-CM | POA: Diagnosis not present

## 2022-03-01 LAB — RENAL FUNCTION PANEL
Albumin: 3.8 g/dL (ref 3.5–5.0)
Anion gap: 10 (ref 5–15)
BUN: 30 mg/dL — ABNORMAL HIGH (ref 8–23)
CO2: 28 mmol/L (ref 22–32)
Calcium: 8.3 mg/dL — ABNORMAL LOW (ref 8.9–10.3)
Chloride: 99 mmol/L (ref 98–111)
Creatinine, Ser: 2.53 mg/dL — ABNORMAL HIGH (ref 0.61–1.24)
GFR, Estimated: 25 mL/min — ABNORMAL LOW (ref >60)
Glucose, Bld: 151 mg/dL — ABNORMAL HIGH (ref 70–99)
Phosphorus: 4 mg/dL (ref 2.5–4.6)
Potassium: 3.2 mmol/L — ABNORMAL LOW (ref 3.5–5.1)
Sodium: 137 mmol/L (ref 135–145)

## 2022-03-01 LAB — CBC WITH DIFFERENTIAL/PLATELET
Abs Immature Granulocytes: 0.02 10*3/uL (ref 0.00–0.07)
Basophils Absolute: 0 10*3/uL (ref 0.0–0.1)
Basophils Relative: 1 %
Eosinophils Absolute: 0.2 10*3/uL (ref 0.0–0.5)
Eosinophils Relative: 4 %
HCT: 31.9 % — ABNORMAL LOW (ref 39.0–52.0)
Hemoglobin: 10.2 g/dL — ABNORMAL LOW (ref 13.0–17.0)
Immature Granulocytes: 0 %
Lymphocytes Relative: 33 %
Lymphs Abs: 1.8 10*3/uL (ref 0.7–4.0)
MCH: 27.2 pg (ref 26.0–34.0)
MCHC: 32 g/dL (ref 30.0–36.0)
MCV: 85.1 fL (ref 80.0–100.0)
Monocytes Absolute: 0.5 10*3/uL (ref 0.1–1.0)
Monocytes Relative: 9 %
Neutro Abs: 2.8 10*3/uL (ref 1.7–7.7)
Neutrophils Relative %: 53 %
Platelets: 171 10*3/uL (ref 150–400)
RBC: 3.75 MIL/uL — ABNORMAL LOW (ref 4.22–5.81)
RDW: 13.5 % (ref 11.5–15.5)
WBC: 5.3 10*3/uL (ref 4.0–10.5)
nRBC: 0 % (ref 0.0–0.2)

## 2022-03-01 LAB — POCT HEMOGLOBIN-HEMACUE: Hemoglobin: 10 g/dL — ABNORMAL LOW (ref 13.0–17.0)

## 2022-03-01 LAB — IRON AND TIBC
Iron: 58 ug/dL (ref 45–182)
Saturation Ratios: 19 % (ref 17.9–39.5)
TIBC: 307 ug/dL (ref 250–450)
UIBC: 249 ug/dL

## 2022-03-01 LAB — FERRITIN: Ferritin: 32 ng/mL (ref 24–336)

## 2022-03-01 LAB — PROTEIN / CREATININE RATIO, URINE
Creatinine, Urine: 182.01 mg/dL
Protein Creatinine Ratio: 0.56 mg/mg{Cre} — ABNORMAL HIGH (ref 0.00–0.15)
Total Protein, Urine: 102 mg/dL

## 2022-03-01 LAB — VITAMIN D 25 HYDROXY (VIT D DEFICIENCY, FRACTURES): Vit D, 25-Hydroxy: 68.13 ng/mL (ref 30–100)

## 2022-03-01 MED ORDER — EPOETIN ALFA-EPBX 3000 UNIT/ML IJ SOLN: 3000.0000 [IU] | Freq: Once | INTRAMUSCULAR | Status: DC

## 2022-03-02 DIAGNOSIS — R809 Proteinuria, unspecified: Secondary | ICD-10-CM | POA: Diagnosis not present

## 2022-03-02 DIAGNOSIS — E1129 Type 2 diabetes mellitus with other diabetic kidney complication: Secondary | ICD-10-CM | POA: Diagnosis not present

## 2022-03-02 DIAGNOSIS — I5032 Chronic diastolic (congestive) heart failure: Secondary | ICD-10-CM | POA: Diagnosis not present

## 2022-03-02 DIAGNOSIS — I129 Hypertensive chronic kidney disease with stage 1 through stage 4 chronic kidney disease, or unspecified chronic kidney disease: Secondary | ICD-10-CM | POA: Diagnosis not present

## 2022-03-02 DIAGNOSIS — N189 Chronic kidney disease, unspecified: Secondary | ICD-10-CM | POA: Diagnosis not present

## 2022-03-02 DIAGNOSIS — E876 Hypokalemia: Secondary | ICD-10-CM | POA: Diagnosis not present

## 2022-03-02 DIAGNOSIS — E211 Secondary hyperparathyroidism, not elsewhere classified: Secondary | ICD-10-CM | POA: Diagnosis not present

## 2022-03-02 DIAGNOSIS — E1122 Type 2 diabetes mellitus with diabetic chronic kidney disease: Secondary | ICD-10-CM | POA: Diagnosis not present

## 2022-03-03 LAB — PTH, INTACT AND CALCIUM
Calcium, Total (PTH): 8.5 mg/dL — ABNORMAL LOW (ref 8.6–10.2)
PTH: 84 pg/mL — ABNORMAL HIGH (ref 15–65)

## 2022-03-30 DIAGNOSIS — N184 Chronic kidney disease, stage 4 (severe): Secondary | ICD-10-CM | POA: Diagnosis not present

## 2022-03-30 DIAGNOSIS — E1122 Type 2 diabetes mellitus with diabetic chronic kidney disease: Secondary | ICD-10-CM | POA: Diagnosis not present

## 2022-03-30 DIAGNOSIS — E7849 Other hyperlipidemia: Secondary | ICD-10-CM | POA: Diagnosis not present

## 2022-03-30 DIAGNOSIS — I129 Hypertensive chronic kidney disease with stage 1 through stage 4 chronic kidney disease, or unspecified chronic kidney disease: Secondary | ICD-10-CM | POA: Diagnosis not present

## 2022-04-04 ENCOUNTER — Telehealth: Payer: Self-pay

## 2022-04-04 NOTE — Telephone Encounter (Signed)
Called and sw patient. Asked him to bring SD card from CPAP machine to appt tomorrow and he agreed. Nothing further needed,

## 2022-04-05 ENCOUNTER — Encounter (HOSPITAL_COMMUNITY): Payer: Self-pay

## 2022-04-05 ENCOUNTER — Encounter: Payer: Self-pay | Admitting: Pulmonary Disease

## 2022-04-05 ENCOUNTER — Ambulatory Visit: Payer: Medicare HMO | Admitting: Pulmonary Disease

## 2022-04-05 ENCOUNTER — Encounter (HOSPITAL_COMMUNITY)
Admission: RE | Admit: 2022-04-05 | Discharge: 2022-04-05 | Disposition: A | Discharge status: Home / Self Care | Payer: Medicare HMO | Source: Ambulatory Visit | Attending: Nephrology | Admitting: Nephrology

## 2022-04-05 VITALS — BP 138/70 | HR 62 | Temp 97.9°F | Ht 69.0 in | Wt 207.8 lb

## 2022-04-05 VITALS — BP 137/76 | HR 84 | Temp 98.6°F | Resp 18

## 2022-04-05 DIAGNOSIS — D631 Anemia in chronic kidney disease: Secondary | ICD-10-CM | POA: Diagnosis not present

## 2022-04-05 DIAGNOSIS — R911 Solitary pulmonary nodule: Secondary | ICD-10-CM

## 2022-04-05 DIAGNOSIS — N184 Chronic kidney disease, stage 4 (severe): Secondary | ICD-10-CM | POA: Diagnosis not present

## 2022-04-05 DIAGNOSIS — G4734 Idiopathic sleep related nonobstructive alveolar hypoventilation: Secondary | ICD-10-CM | POA: Diagnosis not present

## 2022-04-05 LAB — POCT HEMOGLOBIN-HEMACUE: Hemoglobin: 11.3 g/dL — ABNORMAL LOW (ref 13.0–17.0)

## 2022-04-05 MED ORDER — EPOETIN ALFA-EPBX 3000 UNIT/ML IJ SOLN: 3000.0000 [IU] | Freq: Once | INTRAMUSCULAR | Status: DC

## 2022-04-05 NOTE — Progress Notes (Signed)
[facility]  Chief Complaint  Patient presents with   Follow-up    Has not used cpap in 3 months pt states he is uncomfortable while using it.     Constitutional:  BP 138/70 (BP Location: Left Arm, Patient Position: Sitting)   Pulse 62   Temp 97.9 F (36.6 C) (Temporal)   Ht '5\' 9"'$  (1.753 m)   Wt 207 lb 12.8 oz (94.3 kg)   SpO2 97% Comment: ra  BMI 30.69 kg/m   Past Medical History:  Asthma, DM, CKD, Duodenal adenoma, GERD, Gout, SBO, Colon polyp, HLD, HTN, PE, Diastolic CHF, ETOH, CKD 3b  Past Surgical History:  He  has a past surgical history that includes Abdominal surgery (2103); Abdominal surgery (1981); Cataract extraction w/PHACO (Right, 01/06/2020); Cataract extraction w/PHACO (Left, 01/20/2020); back; Hernia repair; Colonoscopy with propofol (N/A, 08/28/2020); Esophagogastroduodenoscopy (egd) with propofol (N/A, 08/28/2020); biopsy (08/28/2020); polypectomy (08/28/2020); Givens capsule study (N/A, 09/10/2020); Esophagogastroduodenoscopy (egd) with propofol (N/A, 12/14/2020); Hemostasis clip placement (12/14/2020); Sclerotherapy (12/14/2020); polypectomy (12/14/2020); Submucosal tattoo injection (12/14/2020); Hemostasis control (12/14/2020); Endoscopic mucosal resection (N/A, 02/07/2022); Esophagogastroduodenoscopy (N/A, 02/07/2022); Submucosal lifting injection (02/07/2022); Hemostasis clip placement (02/07/2022); biopsy (02/07/2022); and Hot hemostasis (N/A, 02/07/2022).  Brief Summary:  Andrew Adams is a 76 y.o. male former smoker with COPD, chronic respiratory failure, and sleep disordered breathing.      Subjective:   He was in hospital in December for SBO.  Lost some weight during this admission.  Didn't use CPAP while in hospital, and didn't start back again after discharge.  He feels he sleeps better w/o CPAP.  He no longer has home oxygen set up either.  He is still working.  Keeps up with activity without difficulty.  No having  cough, wheeze, or sputum.  CT chest from April 2023 showed stable Lt lower lobe nodule.  Other smaller nodules stable also.  HCO3 from Renal panel in 03/01/22 was 28, and Hb 10.2.  Physical Exam:   Appearance - well kempt   ENMT - no sinus tenderness, no oral exudate, no LAN, Mallampati 3 airway, no stridor, wears dentures  Respiratory - equal breath sounds bilaterally, no wheezing or rales  CV - s1s2 regular rate and rhythm, no murmurs  Ext - no clubbing, no edema  Skin - no rashes  Psych - normal mood and affect    Pulmonary testing:  PFT 07/07/20 >> 1.81 (68%), FEV1% 77, TLC 4.62 (67%), DLCO 39%, +BD ABG with FiO2 30% 02/14/20 >> pH 7.34, PCO2 66.5, PO2 70.3  Chest Imaging:  CT angio chest 07/03/12 >> acute PE RUL CT chest 02/19/21 >> 1.8 x 1 cm  LLL nodule stable, 7 mm RLL nodule stable, 3 mm LLL nodule stable CT chest 02/17/22 >> no change LLL nodule  Sleep Tests:  PSG 05/31/20 >> AHI 29.4, SpO2 low 73%, used 2 liters O2 Auto CPAP 3/35/33 to 02/20/21 >> used on 30 of 30 nights with average 8 hrs 31 min.  Average AHI 0.3 with median CPAP 6 and 95 th percentile CPAP 10 cm H2O ONO with CPAP 02/24/21 >> used on 9 hrs 10 min.  Baseline SpO2 91%, SpO2 low 79%.  Spent 1 hr 4 min with SpO2 < 88%.  Cardiac Tests:  Echo 10/30/19 >> EF 55 to 60%, mild LVH, grade 1 DD, mod LA dilation, mild MR  Social History:  He  reports that he quit smoking about 27 years ago. His smoking use included cigarettes. He has  a 38.00 pack-year smoking history. He has never used smokeless tobacco. He reports current alcohol use. He reports that he does not use drugs.  Family History:  His family history includes Diabetes in his mother, sister, and sister; Hypertension in his father, mother, and sister; Ulcers in his father.     Assessment/Plan:   COPD mixed type. - minimal symptoms - he doesn't feel like he needs inhaler therapy at this time  Obstructive sleep apnea. Chronic respiratory failure  with hypoxia/hypercapnia. - had difficulty tolerating CPAP after his last hospital admission - will arrange for ONO with RA and then reassess whether he should resume CPAP therapy of if he might be a candidate for nocturnal oxygen  Lung nodule with history of tobacco abuse. - will need follow up CT chest w/o contrast in April 2024  CKD 4. - followed by Dr. Ulice Bold with [facility]  Time Spent Involved in Patient Care on Day of Examination:  36 minutes  Follow up:   Patient Instructions  Will arrange for overnight oxygen test   Will arrange for CT chest in April 2024  Follow up in May 2024  Medication List:   Allergies as of 04/05/2022   No Known Allergies      Medication List        Accurate as of April 05, 2022  9:39 AM. If you have any questions, ask your nurse or doctor.          allopurinol 100 MG tablet Commonly known as: ZYLOPRIM Take 1 tablet (100 mg total) by mouth daily. What changed: when to take this   amLODipine 10 MG tablet Commonly known as: NORVASC Take 10 mg by mouth daily.   atorvastatin 40 MG tablet Commonly known as: LIPITOR Take 1 tablet (40 mg total) by mouth daily. What changed: when to take this   carvedilol 12.5 MG tablet Commonly known as: COREG Take 12.5 mg by mouth 2 (two) times daily with a meal.   FeroSul 325 (65 FE) MG tablet Generic drug: ferrous sulfate TAKE ONE TABLET BY MOUTH EVERY MORNING WITH BREAKFAST   furosemide 40 MG tablet Commonly known as: LASIX Take 40 mg by mouth 2 (two) times daily.   multivitamin with minerals Tabs tablet Take 1 tablet by mouth daily.   NYQUIL PO Take 1 Dose by mouth at bedtime as needed (cough).   OXYGEN Inhale 2 L into the lungs continuous.   pantoprazole 40 MG tablet Commonly known as: PROTONIX Take 1 tablet (40 mg total) by mouth 2 (two) times daily before a meal.   Retacrit 3000 UNIT/ML injection Generic drug: epoetin alfa-epbx Inject 3,000 Units into  the vein every 14 (fourteen) days.   Trulicity 0.86 PY/1.9JK Sopn Generic drug: Dulaglutide Inject 0.75 mg into the skin every Friday.   valsartan 40 MG tablet Commonly known as: DIOVAN Take 80 mg by mouth daily. What changed: Another medication with the same name was removed. Continue taking this medication, and follow the directions you see here. Changed by: Chesley Mires, MD        Signature:  Chesley Mires, MD [place] Pager - [phone] 04/05/2022, 9:39 AM

## 2022-04-05 NOTE — Patient Instructions (Signed)
Will arrange for overnight oxygen test   Will arrange for CT chest in April 2024  Follow up in May 2024

## 2022-04-15 DIAGNOSIS — E119 Type 2 diabetes mellitus without complications: Secondary | ICD-10-CM | POA: Diagnosis not present

## 2022-05-05 ENCOUNTER — Encounter (HOSPITAL_COMMUNITY)
Admission: RE | Admit: 2022-05-05 | Discharge: 2022-05-05 | Disposition: A | Discharge status: Home / Self Care | Payer: Medicare HMO | Source: Ambulatory Visit | Attending: Nephrology | Admitting: Nephrology

## 2022-05-05 DIAGNOSIS — N184 Chronic kidney disease, stage 4 (severe): Secondary | ICD-10-CM | POA: Insufficient documentation

## 2022-05-05 DIAGNOSIS — D631 Anemia in chronic kidney disease: Secondary | ICD-10-CM | POA: Insufficient documentation

## 2022-05-05 LAB — CBC
HCT: 31.4 % — ABNORMAL LOW (ref 39.0–52.0)
Hemoglobin: 10.6 g/dL — ABNORMAL LOW (ref 13.0–17.0)
MCH: 28.2 pg (ref 26.0–34.0)
MCHC: 33.8 g/dL (ref 30.0–36.0)
MCV: 83.5 fL (ref 80.0–100.0)
Platelets: 169 10*3/uL (ref 150–400)
RBC: 3.76 MIL/uL — ABNORMAL LOW (ref 4.22–5.81)
RDW: 13.7 % (ref 11.5–15.5)
WBC: 6.1 10*3/uL (ref 4.0–10.5)
nRBC: 0 % (ref 0.0–0.2)

## 2022-05-05 LAB — RENAL FUNCTION PANEL
Albumin: 3.7 g/dL (ref 3.5–5.0)
Anion gap: 10 (ref 5–15)
BUN: 43 mg/dL — ABNORMAL HIGH (ref 8–23)
CO2: 28 mmol/L (ref 22–32)
Calcium: 8.4 mg/dL — ABNORMAL LOW (ref 8.9–10.3)
Chloride: 100 mmol/L (ref 98–111)
Creatinine, Ser: 3.22 mg/dL — ABNORMAL HIGH (ref 0.61–1.24)
GFR, Estimated: 19 mL/min — ABNORMAL LOW (ref >60)
Glucose, Bld: 76 mg/dL (ref 70–99)
Phosphorus: 4.6 mg/dL (ref 2.5–4.6)
Potassium: 3.5 mmol/L (ref 3.5–5.1)
Sodium: 138 mmol/L (ref 135–145)

## 2022-05-05 LAB — PROTEIN / CREATININE RATIO, URINE
Creatinine, Urine: 213.41 mg/dL
Protein Creatinine Ratio: 0.26 mg/mg{Cre} — ABNORMAL HIGH (ref 0.00–0.15)
Total Protein, Urine: 56 mg/dL

## 2022-05-05 LAB — POCT HEMOGLOBIN-HEMACUE: Hemoglobin: 10.7 g/dL — ABNORMAL LOW (ref 13.0–17.0)

## 2022-05-05 MED ORDER — EPOETIN ALFA-EPBX 3000 UNIT/ML IJ SOLN: 3000.0000 [IU] | Freq: Once | INTRAMUSCULAR | Status: DC

## 2022-05-07 LAB — PTH, INTACT AND CALCIUM
Calcium, Total (PTH): 8.6 mg/dL (ref 8.6–10.2)
PTH: 111 pg/mL — ABNORMAL HIGH (ref 15–65)

## 2022-05-12 DIAGNOSIS — E1122 Type 2 diabetes mellitus with diabetic chronic kidney disease: Secondary | ICD-10-CM | POA: Diagnosis not present

## 2022-05-12 DIAGNOSIS — N189 Chronic kidney disease, unspecified: Secondary | ICD-10-CM | POA: Diagnosis not present

## 2022-05-12 DIAGNOSIS — N170 Acute kidney failure with tubular necrosis: Secondary | ICD-10-CM | POA: Diagnosis not present

## 2022-05-12 DIAGNOSIS — E211 Secondary hyperparathyroidism, not elsewhere classified: Secondary | ICD-10-CM | POA: Diagnosis not present

## 2022-05-12 DIAGNOSIS — I129 Hypertensive chronic kidney disease with stage 1 through stage 4 chronic kidney disease, or unspecified chronic kidney disease: Secondary | ICD-10-CM | POA: Diagnosis not present

## 2022-05-12 DIAGNOSIS — I5032 Chronic diastolic (congestive) heart failure: Secondary | ICD-10-CM | POA: Diagnosis not present

## 2022-05-12 DIAGNOSIS — R809 Proteinuria, unspecified: Secondary | ICD-10-CM | POA: Diagnosis not present

## 2022-05-12 DIAGNOSIS — E1129 Type 2 diabetes mellitus with other diabetic kidney complication: Secondary | ICD-10-CM | POA: Diagnosis not present

## 2022-05-15 DIAGNOSIS — E119 Type 2 diabetes mellitus without complications: Secondary | ICD-10-CM | POA: Diagnosis not present

## 2022-05-20 DIAGNOSIS — I509 Heart failure, unspecified: Secondary | ICD-10-CM | POA: Diagnosis not present

## 2022-05-20 DIAGNOSIS — J449 Chronic obstructive pulmonary disease, unspecified: Secondary | ICD-10-CM | POA: Diagnosis not present

## 2022-06-07 ENCOUNTER — Encounter (HOSPITAL_COMMUNITY)
Admission: RE | Admit: 2022-06-07 | Discharge: 2022-06-07 | Disposition: A | Discharge status: Home / Self Care | Payer: Medicare HMO | Source: Ambulatory Visit | Attending: Nephrology | Admitting: Nephrology

## 2022-06-07 VITALS — BP 153/86 | HR 79 | Temp 98.4°F | Resp 18 | Ht 69.0 in | Wt 211.0 lb

## 2022-06-07 DIAGNOSIS — N189 Chronic kidney disease, unspecified: Secondary | ICD-10-CM | POA: Diagnosis not present

## 2022-06-07 DIAGNOSIS — N184 Chronic kidney disease, stage 4 (severe): Secondary | ICD-10-CM | POA: Diagnosis not present

## 2022-06-07 DIAGNOSIS — Z862 Personal history of diseases of the blood and blood-forming organs and certain disorders involving the immune mechanism: Secondary | ICD-10-CM | POA: Diagnosis not present

## 2022-06-07 LAB — RENAL FUNCTION PANEL
Albumin: 3.7 g/dL (ref 3.5–5.0)
Anion gap: 7 (ref 5–15)
BUN: 29 mg/dL — ABNORMAL HIGH (ref 8–23)
CO2: 30 mmol/L (ref 22–32)
Calcium: 8.6 mg/dL — ABNORMAL LOW (ref 8.9–10.3)
Chloride: 100 mmol/L (ref 98–111)
Creatinine, Ser: 2.29 mg/dL — ABNORMAL HIGH (ref 0.61–1.24)
GFR, Estimated: 29 mL/min — ABNORMAL LOW (ref >60)
Glucose, Bld: 176 mg/dL — ABNORMAL HIGH (ref 70–99)
Phosphorus: 3.3 mg/dL (ref 2.5–4.6)
Potassium: 3.4 mmol/L — ABNORMAL LOW (ref 3.5–5.1)
Sodium: 137 mmol/L (ref 135–145)

## 2022-06-07 LAB — POCT HEMOGLOBIN-HEMACUE: Hemoglobin: 11 g/dL — ABNORMAL LOW (ref 13.0–17.0)

## 2022-06-07 MED ORDER — EPOETIN ALFA-EPBX 3000 UNIT/ML IJ SOLN: 3000.0000 [IU] | Freq: Once | INTRAMUSCULAR | Status: DC

## 2022-06-08 DIAGNOSIS — R809 Proteinuria, unspecified: Secondary | ICD-10-CM | POA: Diagnosis not present

## 2022-06-08 DIAGNOSIS — E1122 Type 2 diabetes mellitus with diabetic chronic kidney disease: Secondary | ICD-10-CM | POA: Diagnosis not present

## 2022-06-08 DIAGNOSIS — N189 Chronic kidney disease, unspecified: Secondary | ICD-10-CM | POA: Diagnosis not present

## 2022-06-08 DIAGNOSIS — I5032 Chronic diastolic (congestive) heart failure: Secondary | ICD-10-CM | POA: Diagnosis not present

## 2022-06-08 DIAGNOSIS — E1129 Type 2 diabetes mellitus with other diabetic kidney complication: Secondary | ICD-10-CM | POA: Diagnosis not present

## 2022-06-08 DIAGNOSIS — I129 Hypertensive chronic kidney disease with stage 1 through stage 4 chronic kidney disease, or unspecified chronic kidney disease: Secondary | ICD-10-CM | POA: Diagnosis not present

## 2022-06-08 DIAGNOSIS — E876 Hypokalemia: Secondary | ICD-10-CM | POA: Diagnosis not present

## 2022-06-08 DIAGNOSIS — E211 Secondary hyperparathyroidism, not elsewhere classified: Secondary | ICD-10-CM | POA: Diagnosis not present

## 2022-06-08 DIAGNOSIS — N170 Acute kidney failure with tubular necrosis: Secondary | ICD-10-CM | POA: Diagnosis not present

## 2022-06-20 DIAGNOSIS — J449 Chronic obstructive pulmonary disease, unspecified: Secondary | ICD-10-CM | POA: Diagnosis not present

## 2022-06-20 DIAGNOSIS — I509 Heart failure, unspecified: Secondary | ICD-10-CM | POA: Diagnosis not present

## 2022-06-23 DIAGNOSIS — H353131 Nonexudative age-related macular degeneration, bilateral, early dry stage: Secondary | ICD-10-CM | POA: Diagnosis not present

## 2022-06-23 DIAGNOSIS — H01002 Unspecified blepharitis right lower eyelid: Secondary | ICD-10-CM | POA: Diagnosis not present

## 2022-06-23 DIAGNOSIS — H01001 Unspecified blepharitis right upper eyelid: Secondary | ICD-10-CM | POA: Diagnosis not present

## 2022-06-23 DIAGNOSIS — H18413 Arcus senilis, bilateral: Secondary | ICD-10-CM | POA: Diagnosis not present

## 2022-06-29 ENCOUNTER — Other Ambulatory Visit: Payer: Self-pay | Admitting: *Deleted

## 2022-06-29 NOTE — Patient Outreach (Signed)
  Care Coordination   06/29/2022 Name: SABASTION HRDLICKA MRN: [id number] DOB: August 05, 1946   Care Coordination Outreach Attempts:  An unsuccessful telephone outreach was attempted today to offer the patient information about available care coordination services as a benefit of their health plan.   Follow Up Plan:  Additional outreach attempts will be made to offer the patient care coordination information and services.   Encounter Outcome:  No Answer  Care Coordination Interventions Activated:  No   Care Coordination Interventions:  No, not indicated    Valente David, RN, MSN, [facility] Care Management Care Management Coordinator [phone]

## 2022-07-01 ENCOUNTER — Other Ambulatory Visit: Payer: Self-pay | Admitting: *Deleted

## 2022-07-01 NOTE — Patient Outreach (Signed)
  Care Coordination   07/01/2022 Name: Andrew Adams MRN: [id number] DOB: 23-Dec-1945   Care Coordination Outreach Attempts:  A second unsuccessful outreach was attempted today to offer the patient with information about available care coordination services as a benefit of their health plan.     Follow Up Plan:  Additional outreach attempts will be made to offer the patient care coordination information and services.   Encounter Outcome:  No Answer  Care Coordination Interventions Activated:  No   Care Coordination Interventions:  No, not indicated    Valente David, RN, MSN, [facility] Care Management Care Management Coordinator [phone]

## 2022-07-05 ENCOUNTER — Encounter (HOSPITAL_COMMUNITY)
Admission: RE | Admit: 2022-07-05 | Discharge: 2022-07-05 | Disposition: A | Discharge status: Home / Self Care | Payer: Medicare HMO | Source: Ambulatory Visit | Attending: Nephrology | Admitting: Nephrology

## 2022-07-05 ENCOUNTER — Encounter (HOSPITAL_COMMUNITY): Payer: Self-pay

## 2022-07-05 VITALS — BP 142/85 | HR 80 | Temp 98.8°F | Resp 20

## 2022-07-05 DIAGNOSIS — N184 Chronic kidney disease, stage 4 (severe): Secondary | ICD-10-CM | POA: Insufficient documentation

## 2022-07-05 LAB — RENAL FUNCTION PANEL
Albumin: 3.6 g/dL (ref 3.5–5.0)
Anion gap: 8 (ref 5–15)
BUN: 51 mg/dL — ABNORMAL HIGH (ref 8–23)
CO2: 29 mmol/L (ref 22–32)
Calcium: 8.6 mg/dL — ABNORMAL LOW (ref 8.9–10.3)
Chloride: 101 mmol/L (ref 98–111)
Creatinine, Ser: 2.63 mg/dL — ABNORMAL HIGH (ref 0.61–1.24)
GFR, Estimated: 24 mL/min — ABNORMAL LOW (ref >60)
Glucose, Bld: 91 mg/dL (ref 70–99)
Phosphorus: 3.2 mg/dL (ref 2.5–4.6)
Potassium: 3.4 mmol/L — ABNORMAL LOW (ref 3.5–5.1)
Sodium: 138 mmol/L (ref 135–145)

## 2022-07-05 LAB — POCT HEMOGLOBIN-HEMACUE: Hemoglobin: 10.8 g/dL — ABNORMAL LOW (ref 13.0–17.0)

## 2022-07-05 MED ORDER — EPOETIN ALFA-EPBX 3000 UNIT/ML IJ SOLN: 3000.0000 [IU] | Freq: Once | INTRAMUSCULAR | Status: DC

## 2022-07-17 IMAGING — CT CT CHEST W/O CM
2 of 4 series · 15 of 36 positions shown, 18 images · non-contrast
Comparison: 02/19/2021, 12/14/2017

CLINICAL DATA: Lung nodule, former smoker



[Series 2: routine chest without · axial · non-contrast · 0.82mm/px · z∈[+1285,+1551]mm · 12 of 159 slices shown, 15 images]
[im 13/159  mediastinal]
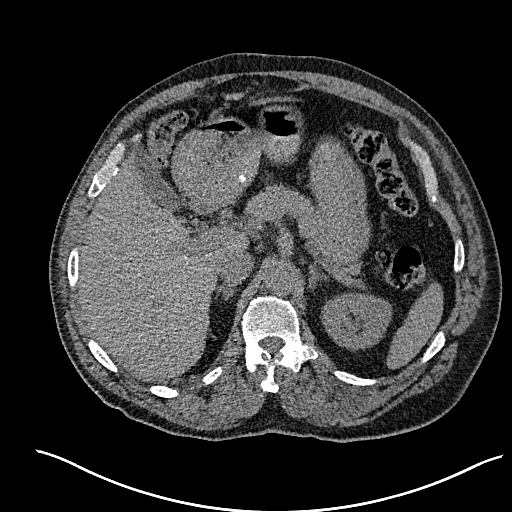
[im 13/159  lung]
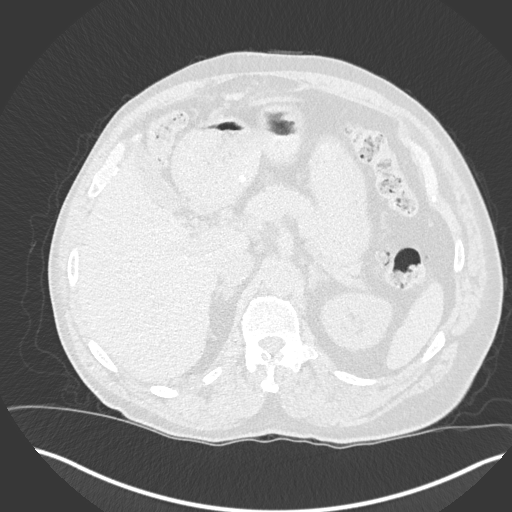
[im 25/159  lung]
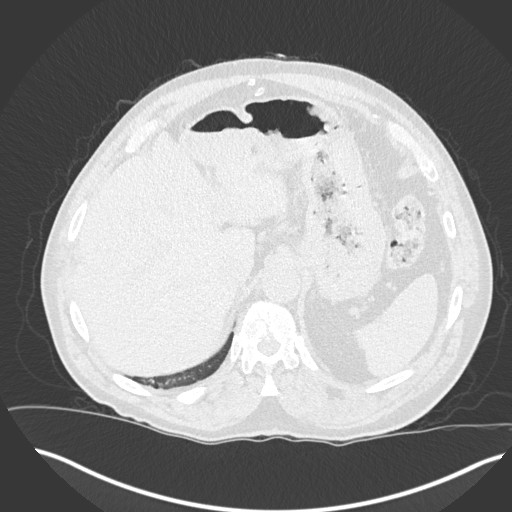
[im 37/159  lung]
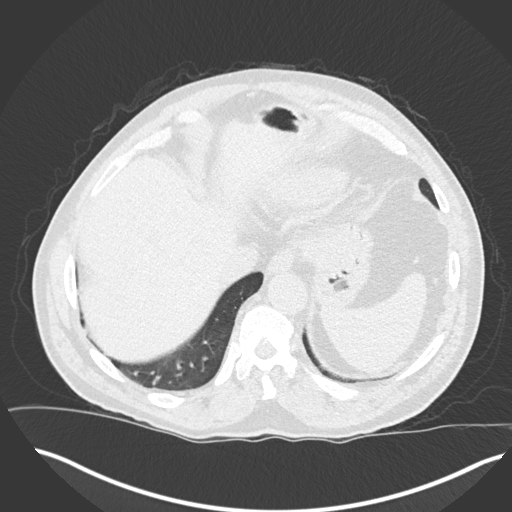
[im 49/159  lung]
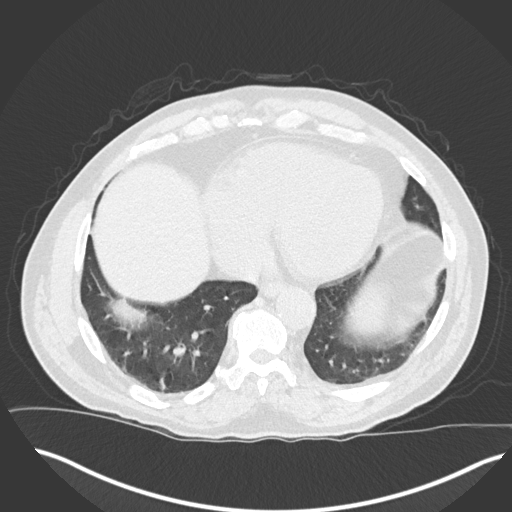
[im 61/159  mediastinal]
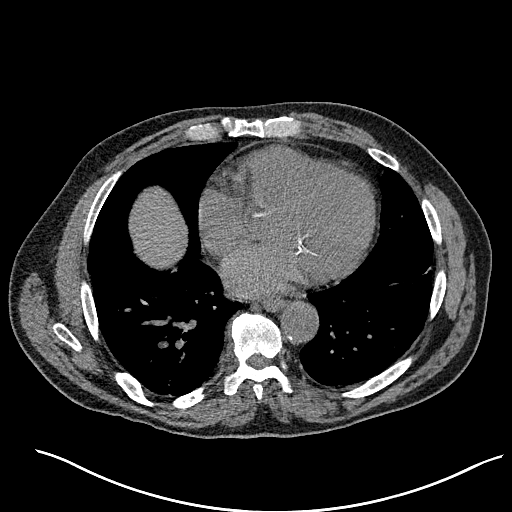
[im 61/159  lung]
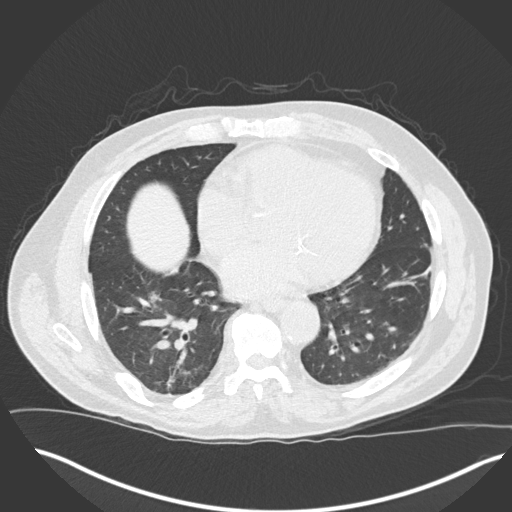
[im 73/159  lung]
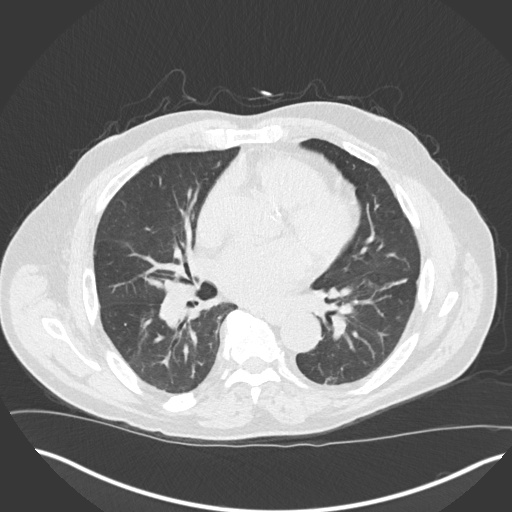
[im 86/159  lung]
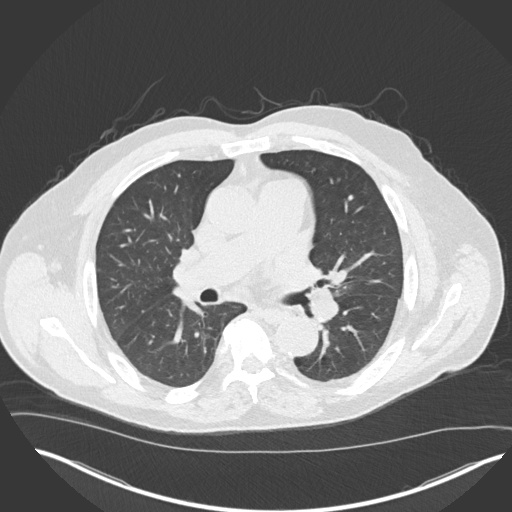
[im 98/159  lung]
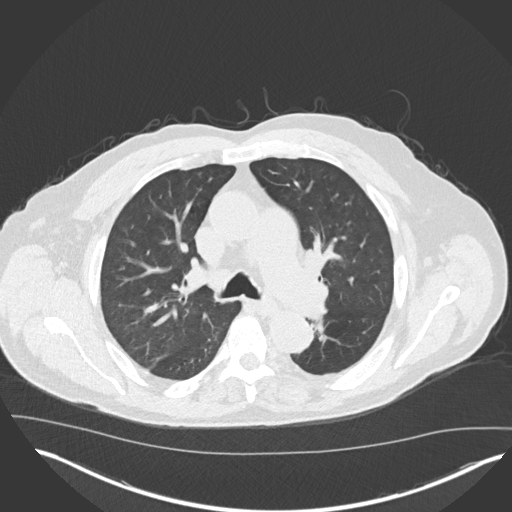
[im 110/159  mediastinal]
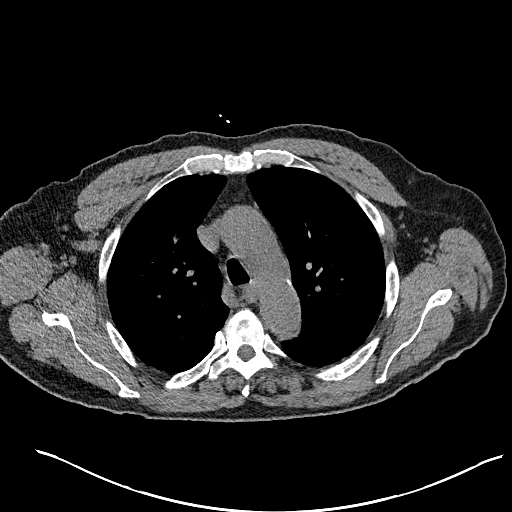
[im 110/159  lung]
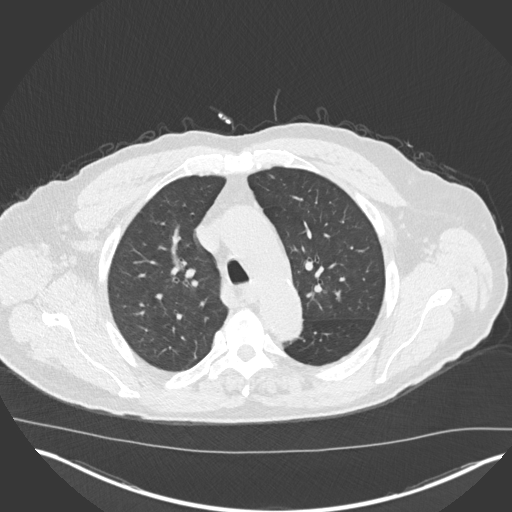
[im 122/159  lung]
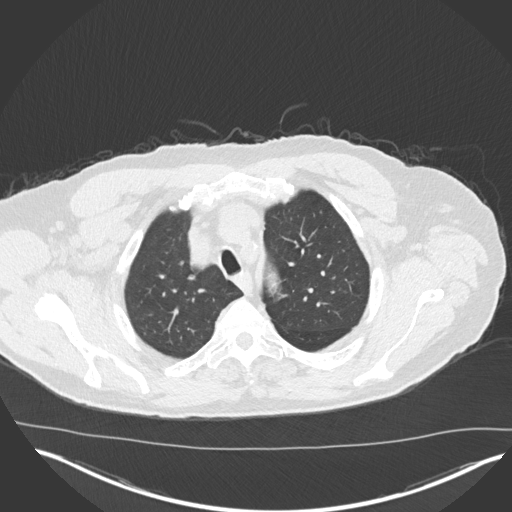
[im 134/159  lung]
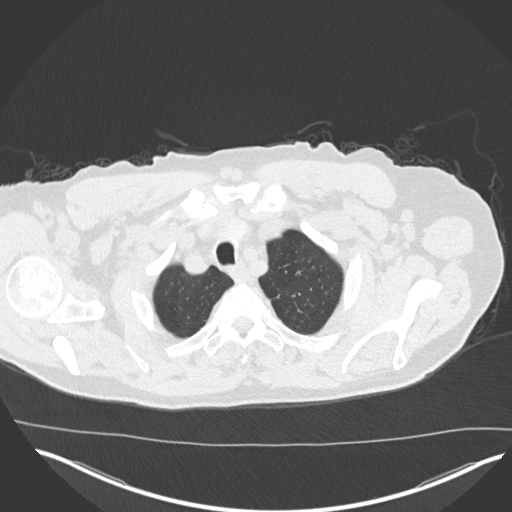
[im 146/159  lung]
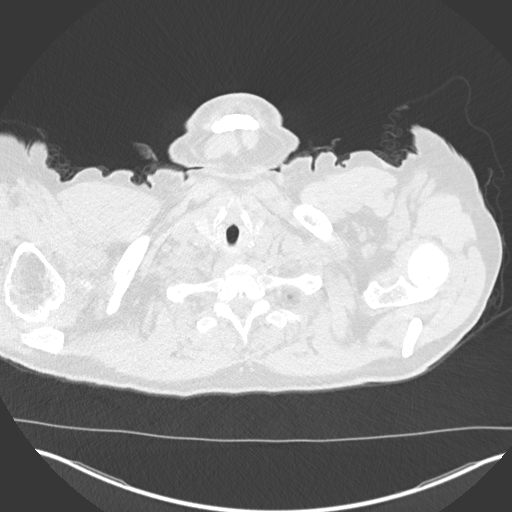

[Series 5: coronal · coronal · 0.67mm/px · 3 of 165 slices shown]
[im 33/165  lung]
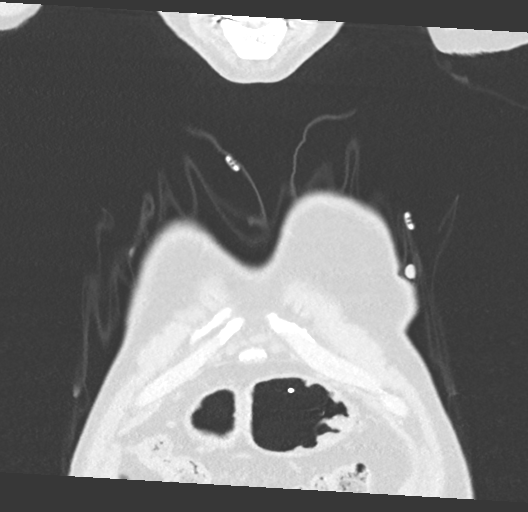
[im 66/165  lung]
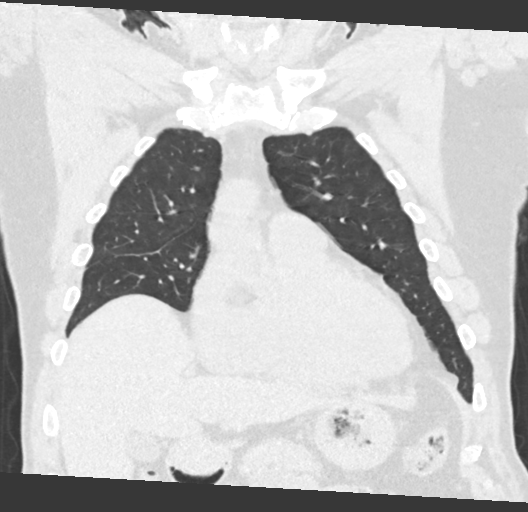
[im 99/165  lung]
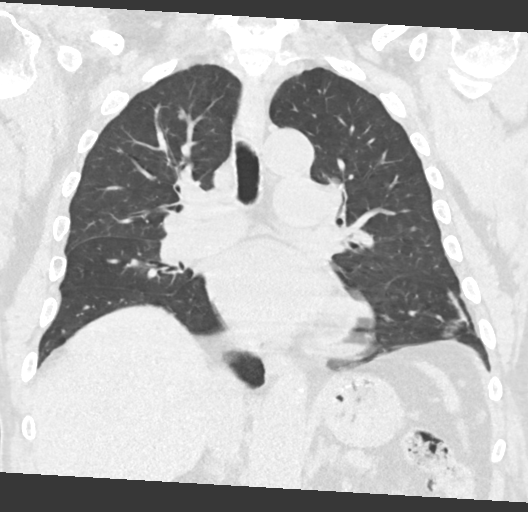

[15 of 36 positions shown; findings below may reference images not displayed]

FINDINGS: Cardiovascular: Aortic atherosclerosis. Cardiomegaly. Left and right
coronary artery calcifications. Enlargement of the main pulmonary
artery measuring up to 3.8 cm in caliber. No pericardial effusion.

Mediastinum/Nodes: No enlarged mediastinal, hilar, or axillary lymph
nodes. Thyroid gland, trachea, and esophagus demonstrate no
significant findings.

Lungs/Pleura: No significant change in a subpleural nodule of the
dependent left lower lobe measuring 1.8 x 0.7 cm (series 4, image
94). Additional small bilateral pulmonary nodules measuring up to
0.6 cm in the superior segment right lower lobe, definitively benign
(series 4, image 80). Bandlike scarring of the bilateral lung bases.
No pleural effusion or pneumothorax.

Upper Abdomen: No acute abnormality.

Musculoskeletal: No chest wall abnormality. No suspicious osseous
lesions identified.
IMPRESSION: 1. No significant change in a subpleural nodule of the dependent
left lower lobe measuring 1.8 x 0.7 cm. This is most likely benign
sequelae of prior infection or inflammation, however given
historical enlargement in the interval from 12/04/2017 2 02/19/2021,
consider additional examination in 1 year to establish further
stability.
2. Additional small bilateral pulmonary nodules, stable and
definitively benign, for which no further specific follow-up is
required.
3. Cardiomegaly and coronary artery disease.
4. Enlargement of the main pulmonary artery, as can be seen in
pulmonary hypertension.

Aortic Atherosclerosis ([id number].I).

## 2022-07-18 DIAGNOSIS — E119 Type 2 diabetes mellitus without complications: Secondary | ICD-10-CM | POA: Diagnosis not present

## 2022-07-21 DIAGNOSIS — I509 Heart failure, unspecified: Secondary | ICD-10-CM | POA: Diagnosis not present

## 2022-07-21 DIAGNOSIS — J449 Chronic obstructive pulmonary disease, unspecified: Secondary | ICD-10-CM | POA: Diagnosis not present

## 2022-07-29 ENCOUNTER — Telehealth: Payer: Self-pay | Admitting: *Deleted

## 2022-07-29 NOTE — Patient Outreach (Signed)
  Care Coordination   07/29/2022 Name: Andrew Adams MRN: [id number] DOB: Jan 15, 1946   Care Coordination Outreach Attempts:  A third unsuccessful outreach was attempted today to offer the patient with information about available care coordination services as a benefit of their health plan.   Follow Up Plan:  No further outreach attempts will be made at this time. We have been unable to contact the patient to offer or enroll patient in care coordination services  Encounter Outcome:  No Answer  Care Coordination Interventions Activated:  No   Care Coordination Interventions:  No, not indicated    Valente David, RN, MSN, [facility] Care Management Care Management Coordinator [phone]

## 2022-08-09 ENCOUNTER — Encounter (HOSPITAL_COMMUNITY)
Admission: RE | Admit: 2022-08-09 | Discharge: 2022-08-09 | Disposition: A | Discharge status: Home / Self Care | Payer: Medicare HMO | Source: Ambulatory Visit | Attending: Nephrology | Admitting: Nephrology

## 2022-08-09 VITALS — BP 161/86 | HR 84 | Temp 98.0°F | Resp 22 | Ht 69.0 in | Wt 211.0 lb

## 2022-08-09 DIAGNOSIS — I129 Hypertensive chronic kidney disease with stage 1 through stage 4 chronic kidney disease, or unspecified chronic kidney disease: Secondary | ICD-10-CM | POA: Insufficient documentation

## 2022-08-09 DIAGNOSIS — I5032 Chronic diastolic (congestive) heart failure: Secondary | ICD-10-CM | POA: Insufficient documentation

## 2022-08-09 DIAGNOSIS — R809 Proteinuria, unspecified: Secondary | ICD-10-CM | POA: Diagnosis not present

## 2022-08-09 DIAGNOSIS — N170 Acute kidney failure with tubular necrosis: Secondary | ICD-10-CM | POA: Diagnosis not present

## 2022-08-09 DIAGNOSIS — E1122 Type 2 diabetes mellitus with diabetic chronic kidney disease: Secondary | ICD-10-CM | POA: Insufficient documentation

## 2022-08-09 DIAGNOSIS — N189 Chronic kidney disease, unspecified: Secondary | ICD-10-CM

## 2022-08-09 DIAGNOSIS — D631 Anemia in chronic kidney disease: Secondary | ICD-10-CM | POA: Diagnosis not present

## 2022-08-09 DIAGNOSIS — N184 Chronic kidney disease, stage 4 (severe): Secondary | ICD-10-CM | POA: Diagnosis present

## 2022-08-09 DIAGNOSIS — E211 Secondary hyperparathyroidism, not elsewhere classified: Secondary | ICD-10-CM | POA: Insufficient documentation

## 2022-08-09 DIAGNOSIS — E876 Hypokalemia: Secondary | ICD-10-CM | POA: Insufficient documentation

## 2022-08-09 LAB — RENAL FUNCTION PANEL
Albumin: 3.6 g/dL (ref 3.5–5.0)
Anion gap: 8 (ref 5–15)
BUN: 32 mg/dL — ABNORMAL HIGH (ref 8–23)
CO2: 29 mmol/L (ref 22–32)
Calcium: 8.3 mg/dL — ABNORMAL LOW (ref 8.9–10.3)
Chloride: 102 mmol/L (ref 98–111)
Creatinine, Ser: 2.45 mg/dL — ABNORMAL HIGH (ref 0.61–1.24)
GFR, Estimated: 26 mL/min — ABNORMAL LOW (ref >60)
Glucose, Bld: 145 mg/dL — ABNORMAL HIGH (ref 70–99)
Phosphorus: 3.3 mg/dL (ref 2.5–4.6)
Potassium: 3.9 mmol/L (ref 3.5–5.1)
Sodium: 139 mmol/L (ref 135–145)

## 2022-08-09 LAB — CBC
HCT: 32.6 % — ABNORMAL LOW (ref 39.0–52.0)
Hemoglobin: 10.9 g/dL — ABNORMAL LOW (ref 13.0–17.0)
MCH: 29 pg (ref 26.0–34.0)
MCHC: 33.4 g/dL (ref 30.0–36.0)
MCV: 86.7 fL (ref 80.0–100.0)
Platelets: 144 10*3/uL — ABNORMAL LOW (ref 150–400)
RBC: 3.76 MIL/uL — ABNORMAL LOW (ref 4.22–5.81)
RDW: 14.1 % (ref 11.5–15.5)
WBC: 4.3 10*3/uL (ref 4.0–10.5)
nRBC: 0 % (ref 0.0–0.2)

## 2022-08-09 LAB — PROTEIN / CREATININE RATIO, URINE
Creatinine, Urine: 177.05 mg/dL
Protein Creatinine Ratio: 0.24 mg/mg{Cre} — ABNORMAL HIGH (ref 0.00–0.15)
Total Protein, Urine: 42 mg/dL

## 2022-08-09 LAB — POCT HEMOGLOBIN-HEMACUE: Hemoglobin: 10.9 g/dL — ABNORMAL LOW (ref 13.0–17.0)

## 2022-08-09 MED ORDER — EPOETIN ALFA-EPBX 3000 UNIT/ML IJ SOLN: 3000.0000 [IU] | Freq: Once | INTRAMUSCULAR | Status: DC

## 2022-08-17 DIAGNOSIS — E1122 Type 2 diabetes mellitus with diabetic chronic kidney disease: Secondary | ICD-10-CM | POA: Diagnosis not present

## 2022-08-17 DIAGNOSIS — I5032 Chronic diastolic (congestive) heart failure: Secondary | ICD-10-CM | POA: Diagnosis not present

## 2022-08-17 DIAGNOSIS — N189 Chronic kidney disease, unspecified: Secondary | ICD-10-CM | POA: Diagnosis not present

## 2022-08-17 DIAGNOSIS — E1129 Type 2 diabetes mellitus with other diabetic kidney complication: Secondary | ICD-10-CM | POA: Diagnosis not present

## 2022-08-17 DIAGNOSIS — R809 Proteinuria, unspecified: Secondary | ICD-10-CM | POA: Diagnosis not present

## 2022-08-17 DIAGNOSIS — I129 Hypertensive chronic kidney disease with stage 1 through stage 4 chronic kidney disease, or unspecified chronic kidney disease: Secondary | ICD-10-CM | POA: Diagnosis not present

## 2022-08-17 DIAGNOSIS — E211 Secondary hyperparathyroidism, not elsewhere classified: Secondary | ICD-10-CM | POA: Diagnosis not present

## 2022-08-18 ENCOUNTER — Inpatient Hospital Stay (HOSPITAL_COMMUNITY)
Admission: EM | Admit: 2022-08-18 | Discharge: 2022-08-20 | DRG: 175 | Disposition: A | Discharge status: Home / Self Care | Payer: Medicare HMO | Attending: Internal Medicine | Admitting: Internal Medicine

## 2022-08-18 ENCOUNTER — Emergency Department (HOSPITAL_COMMUNITY): Payer: Medicare HMO

## 2022-08-18 ENCOUNTER — Encounter (HOSPITAL_COMMUNITY): Payer: Self-pay

## 2022-08-18 ENCOUNTER — Other Ambulatory Visit: Payer: Self-pay

## 2022-08-18 DIAGNOSIS — N184 Chronic kidney disease, stage 4 (severe): Secondary | ICD-10-CM | POA: Diagnosis not present

## 2022-08-18 DIAGNOSIS — J449 Chronic obstructive pulmonary disease, unspecified: Secondary | ICD-10-CM | POA: Diagnosis present

## 2022-08-18 DIAGNOSIS — Z8249 Family history of ischemic heart disease and other diseases of the circulatory system: Secondary | ICD-10-CM | POA: Diagnosis not present

## 2022-08-18 DIAGNOSIS — I509 Heart failure, unspecified: Secondary | ICD-10-CM | POA: Diagnosis not present

## 2022-08-18 DIAGNOSIS — Z833 Family history of diabetes mellitus: Secondary | ICD-10-CM | POA: Diagnosis not present

## 2022-08-18 DIAGNOSIS — J441 Chronic obstructive pulmonary disease with (acute) exacerbation: Secondary | ICD-10-CM | POA: Diagnosis present

## 2022-08-18 DIAGNOSIS — E1122 Type 2 diabetes mellitus with diabetic chronic kidney disease: Secondary | ICD-10-CM | POA: Diagnosis present

## 2022-08-18 DIAGNOSIS — D631 Anemia in chronic kidney disease: Secondary | ICD-10-CM | POA: Diagnosis not present

## 2022-08-18 DIAGNOSIS — E785 Hyperlipidemia, unspecified: Secondary | ICD-10-CM | POA: Diagnosis not present

## 2022-08-18 DIAGNOSIS — I130 Hypertensive heart and chronic kidney disease with heart failure and stage 1 through stage 4 chronic kidney disease, or unspecified chronic kidney disease: Secondary | ICD-10-CM | POA: Diagnosis not present

## 2022-08-18 DIAGNOSIS — Z7985 Long-term (current) use of injectable non-insulin antidiabetic drugs: Secondary | ICD-10-CM

## 2022-08-18 DIAGNOSIS — E119 Type 2 diabetes mellitus without complications: Secondary | ICD-10-CM

## 2022-08-18 DIAGNOSIS — I5032 Chronic diastolic (congestive) heart failure: Secondary | ICD-10-CM

## 2022-08-18 DIAGNOSIS — J9621 Acute and chronic respiratory failure with hypoxia: Secondary | ICD-10-CM | POA: Diagnosis present

## 2022-08-18 DIAGNOSIS — Z79899 Other long term (current) drug therapy: Secondary | ICD-10-CM | POA: Diagnosis not present

## 2022-08-18 DIAGNOSIS — G4733 Obstructive sleep apnea (adult) (pediatric): Secondary | ICD-10-CM | POA: Diagnosis not present

## 2022-08-18 DIAGNOSIS — I10 Essential (primary) hypertension: Secondary | ICD-10-CM | POA: Diagnosis not present

## 2022-08-18 DIAGNOSIS — J9601 Acute respiratory failure with hypoxia: Principal | ICD-10-CM | POA: Diagnosis present

## 2022-08-18 DIAGNOSIS — K219 Gastro-esophageal reflux disease without esophagitis: Secondary | ICD-10-CM | POA: Diagnosis present

## 2022-08-18 DIAGNOSIS — I77819 Aortic ectasia, unspecified site: Secondary | ICD-10-CM | POA: Diagnosis not present

## 2022-08-18 DIAGNOSIS — Z87891 Personal history of nicotine dependence: Secondary | ICD-10-CM

## 2022-08-18 DIAGNOSIS — M109 Gout, unspecified: Secondary | ICD-10-CM | POA: Diagnosis not present

## 2022-08-18 DIAGNOSIS — Z20822 Contact with and (suspected) exposure to covid-19: Secondary | ICD-10-CM | POA: Diagnosis present

## 2022-08-18 DIAGNOSIS — I2699 Other pulmonary embolism without acute cor pulmonale: Secondary | ICD-10-CM | POA: Diagnosis not present

## 2022-08-18 DIAGNOSIS — R0602 Shortness of breath: Secondary | ICD-10-CM | POA: Diagnosis not present

## 2022-08-18 DIAGNOSIS — R059 Cough, unspecified: Secondary | ICD-10-CM | POA: Diagnosis not present

## 2022-08-18 DIAGNOSIS — R600 Localized edema: Secondary | ICD-10-CM | POA: Diagnosis not present

## 2022-08-18 LAB — CBC
HCT: 37.4 % — ABNORMAL LOW (ref 39.0–52.0)
Hemoglobin: 11.2 g/dL — ABNORMAL LOW (ref 13.0–17.0)
MCH: 28.2 pg (ref 26.0–34.0)
MCHC: 29.9 g/dL — ABNORMAL LOW (ref 30.0–36.0)
MCV: 94.2 fL (ref 80.0–100.0)
Platelets: 165 10*3/uL (ref 150–400)
RBC: 3.97 MIL/uL — ABNORMAL LOW (ref 4.22–5.81)
RDW: 13.5 % (ref 11.5–15.5)
WBC: 4.4 10*3/uL (ref 4.0–10.5)
nRBC: 0 % (ref 0.0–0.2)

## 2022-08-18 LAB — TROPONIN I (HIGH SENSITIVITY)
Troponin I (High Sensitivity): 8 ng/L (ref <18)
Troponin I (High Sensitivity): 8 ng/L (ref <18)

## 2022-08-18 LAB — URINALYSIS, ROUTINE W REFLEX MICROSCOPIC
Bacteria, UA: NONE SEEN
Bilirubin Urine: NEGATIVE
Glucose, UA: NEGATIVE mg/dL
Hgb urine dipstick: NEGATIVE
Ketones, ur: NEGATIVE mg/dL
Nitrite: NEGATIVE
Protein, ur: 100 mg/dL — AB
Specific Gravity, Urine: 1.009 (ref 1.005–1.030)
pH: 6 (ref 5.0–8.0)

## 2022-08-18 LAB — BASIC METABOLIC PANEL
Anion gap: 9 (ref 5–15)
BUN: 26 mg/dL — ABNORMAL HIGH (ref 8–23)
CO2: 28 mmol/L (ref 22–32)
Calcium: 8.6 mg/dL — ABNORMAL LOW (ref 8.9–10.3)
Chloride: 101 mmol/L (ref 98–111)
Creatinine, Ser: 2.15 mg/dL — ABNORMAL HIGH (ref 0.61–1.24)
GFR, Estimated: 31 mL/min — ABNORMAL LOW (ref >60)
Glucose, Bld: 112 mg/dL — ABNORMAL HIGH (ref 70–99)
Potassium: 4 mmol/L (ref 3.5–5.1)
Sodium: 138 mmol/L (ref 135–145)

## 2022-08-18 LAB — BRAIN NATRIURETIC PEPTIDE: B Natriuretic Peptide: 219 pg/mL — ABNORMAL HIGH (ref 0.0–100.0)

## 2022-08-18 LAB — RESP PANEL BY RT-PCR (FLU A&B, COVID) ARPGX2
Influenza A by PCR: NEGATIVE
Influenza B by PCR: NEGATIVE
SARS Coronavirus 2 by RT PCR: NEGATIVE

## 2022-08-18 MED ORDER — GUAIFENESIN-DM 100-10 MG/5ML PO SYRP
10.0000 mL | ORAL_SOLUTION | Freq: Three times a day (TID) | ORAL | Status: AC
  Administered 2022-08-19 (×2): 10 mL via ORAL
  Filled 2022-08-18 (×2): qty 10

## 2022-08-18 MED ORDER — POLYETHYLENE GLYCOL 3350 17 G PO PACK: 17.0000 g | PACK | Freq: Every day | ORAL | Status: DC | PRN

## 2022-08-18 MED ORDER — ONDANSETRON HCL 4 MG PO TABS: 4.0000 mg | ORAL_TABLET | Freq: Four times a day (QID) | ORAL | Status: DC | PRN

## 2022-08-18 MED ORDER — ONDANSETRON HCL 4 MG/2ML IJ SOLN: 4.0000 mg | Freq: Four times a day (QID) | INTRAMUSCULAR | Status: DC | PRN

## 2022-08-18 MED ORDER — AMLODIPINE BESYLATE 5 MG PO TABS
10.0000 mg | ORAL_TABLET | Freq: Every day | ORAL | Status: DC
  Administered 2022-08-19 – 2022-08-20 (×2): 10 mg via ORAL
  Filled 2022-08-18 (×3): qty 2

## 2022-08-18 MED ORDER — ENOXAPARIN SODIUM 100 MG/ML IJ SOSY
100.0000 mg | PREFILLED_SYRINGE | Freq: Two times a day (BID) | INTRAMUSCULAR | Status: DC
  Administered 2022-08-19 – 2022-08-20 (×2): 100 mg via SUBCUTANEOUS
  Filled 2022-08-18 (×2): qty 1

## 2022-08-18 MED ORDER — ACETAMINOPHEN 650 MG RE SUPP: 650.0000 mg | Freq: Four times a day (QID) | RECTAL | Status: DC | PRN

## 2022-08-18 MED ORDER — ACETAMINOPHEN 325 MG PO TABS: 650.0000 mg | ORAL_TABLET | Freq: Four times a day (QID) | ORAL | Status: DC | PRN

## 2022-08-18 MED ORDER — TECHNETIUM TO 99M ALBUMIN AGGREGATED
4.4000 | Freq: Once | INTRAVENOUS | Status: AC | PRN
  Administered 2022-08-18: 4.4 via INTRAVENOUS

## 2022-08-18 MED ORDER — CARVEDILOL 12.5 MG PO TABS
12.5000 mg | ORAL_TABLET | Freq: Two times a day (BID) | ORAL | Status: DC
  Administered 2022-08-19 – 2022-08-20 (×3): 12.5 mg via ORAL
  Filled 2022-08-18 (×3): qty 1

## 2022-08-18 MED ORDER — ENOXAPARIN SODIUM 100 MG/ML IJ SOSY
100.0000 mg | PREFILLED_SYRINGE | Freq: Once | INTRAMUSCULAR | Status: AC
  Administered 2022-08-18: 100 mg via SUBCUTANEOUS
  Filled 2022-08-18: qty 1

## 2022-08-18 MED ORDER — INSULIN ASPART 100 UNIT/ML IJ SOLN: 0.0000 [IU] | Freq: Every day | INTRAMUSCULAR | Status: DC

## 2022-08-18 MED ORDER — FUROSEMIDE 40 MG PO TABS
40.0000 mg | ORAL_TABLET | Freq: Two times a day (BID) | ORAL | Status: DC
  Administered 2022-08-19 – 2022-08-20 (×3): 40 mg via ORAL
  Filled 2022-08-18 (×3): qty 1

## 2022-08-18 MED ORDER — INSULIN ASPART 100 UNIT/ML IJ SOLN
0.0000 [IU] | Freq: Three times a day (TID) | INTRAMUSCULAR | Status: DC
  Administered 2022-08-19: 3 [IU] via SUBCUTANEOUS
  Administered 2022-08-20: 1 [IU] via SUBCUTANEOUS
  Administered 2022-08-20: 2 [IU] via SUBCUTANEOUS

## 2022-08-18 MED ORDER — IRBESARTAN 75 MG PO TABS
75.0000 mg | ORAL_TABLET | Freq: Every day | ORAL | Status: DC
  Administered 2022-08-19: 75 mg via ORAL
  Filled 2022-08-18: qty 1

## 2022-08-18 MED ORDER — FLUTICASONE PROPIONATE 50 MCG/ACT NA SUSP
2.0000 | Freq: Every day | NASAL | Status: DC
  Administered 2022-08-20: 2 via NASAL
  Filled 2022-08-18: qty 16

## 2022-08-18 MED ORDER — ALLOPURINOL 100 MG PO TABS
100.0000 mg | ORAL_TABLET | Freq: Every day | ORAL | Status: DC
  Administered 2022-08-19: 100 mg via ORAL
  Filled 2022-08-18: qty 1

## 2022-08-18 MED ORDER — ATORVASTATIN CALCIUM 40 MG PO TABS
40.0000 mg | ORAL_TABLET | Freq: Every day | ORAL | Status: DC
  Administered 2022-08-19 (×2): 40 mg via ORAL
  Filled 2022-08-18 (×2): qty 1

## 2022-08-18 MED ORDER — PANTOPRAZOLE SODIUM 40 MG PO TBEC
40.0000 mg | DELAYED_RELEASE_TABLET | Freq: Two times a day (BID) | ORAL | Status: DC
  Administered 2022-08-19 – 2022-08-20 (×3): 40 mg via ORAL
  Filled 2022-08-18 (×3): qty 1

## 2022-08-18 NOTE — Assessment & Plan Note (Signed)
Resume Norvasc, carvedilol, valsartan

## 2022-08-18 NOTE — Assessment & Plan Note (Signed)
Creatinine 2.15, improved compared to prior.  Recent baseline 2.2-2.6.

## 2022-08-18 NOTE — Assessment & Plan Note (Signed)
-   HgbA1c - SSi- S - Hold trulicity

## 2022-08-18 NOTE — ED Notes (Signed)
Patient transported to X-ray 

## 2022-08-18 NOTE — Assessment & Plan Note (Signed)
pulmonary embolism appears unprovoked.  With intermittent hypoxia.  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  History of PE at least 10 years ago for which she was on anticoagulation for about a year.  CKD 4 so V/Q was done which showed a peripheral wedge-shaped perfusion defect in left lung and left lower lobe segments.   -Bilateral lower extremity venous Dopplers negative for DVT -Obtain echo -IV Lovenox started, will continue

## 2022-08-18 NOTE — Assessment & Plan Note (Signed)
CPAP nightly

## 2022-08-18 NOTE — Progress Notes (Signed)
Patient declined CPAP. Stated that he doesn't like it and hasn't worn one for years at home. Doing well on nasal cannula at this time.

## 2022-08-18 NOTE — Assessment & Plan Note (Signed)
Stable.  Quit smoking cigarettes over 30 years ago.

## 2022-08-18 NOTE — Progress Notes (Signed)
ANTICOAGULATION CONSULT NOTE - Initial Consult  Pharmacy Consult for lovenox Indication: pulmonary embolus  No Known Allergies  Patient Measurements: Height: '5\' 9"'$  (175.3 cm) Weight: 99.3 kg (219 lb) IBW/kg (Calculated) : 70.7 Heparin Dosing Weight:   Vital Signs: Temp: 97.6 F (36.4 C) (10/19 1420) Temp Source: Oral (10/19 1420) BP: 156/92 (10/19 1400) Pulse Rate: 74 (10/19 1417)  Labs: Recent Labs    08/18/22 1111 08/18/22 1256  HGB 11.2*  --   HCT 37.4*  --   PLT 165  --   CREATININE 2.15*  --   TROPONINIHS 8 8    Estimated Creatinine Clearance: 32.9 mL/min (A) (by C-G formula based on SCr of 2.15 mg/dL (H)).   Medical History: Past Medical History:  Diagnosis Date   Anemia    Borderline diabetic    Chronic kidney disease    Diabetes mellitus without complication ([place])    Duodenal adenoma    2021   GERD (gastroesophageal reflux disease)    Gout    Gouty arthritis    H/O small bowel obstruction    History of back surgery    Hx of adenomatous colonic polyps    2021   Hyperlipidemia    Hypertension    PE (pulmonary embolism)    Sleep apnea     Medications:  (Not in a hospital admission)   Assessment: Pharmacy consulted to dose lovenox in patient with pulmonary embolism.    CBC WNL  Goal of Therapy:   Monitor platelets by anticoagulation protocol: Yes   Plan:  Lovenox 100 mg subq every 12 hours. Monitor H&H and renal function (borderline dose adjustment needed)  Margot Ables, PharmD Clinical Pharmacist 08/18/2022 2:31 PM

## 2022-08-18 NOTE — ED Notes (Signed)
IV access is difficult

## 2022-08-18 NOTE — ED Provider Notes (Signed)
[facility] EMERGENCY DEPARTMENT Provider Note   CSN: [id number] Arrival date & time: 08/18/22  1034     History  Chief Complaint  Patient presents with   Chest Pain    Andrew Adams is a 76 y.o. male.   Chest Pain   76 year old male presents emergency department with complaints of chest pain.  Patient states that chest pain began yesterday.  Patient works as a Administrator, arts.  He noted exacerbation of chest pain with minimal physical activity.  He states that he rested and the chest pain went away in 5 to 10 minutes.  He notes a recurrent episode yesterday as well as another episode this morning similar in nature.  Reports some associated shortness of breath.  He notes increasing cough over the past 2 to 3 weeks.  Reports taking diuretic for lower leg swelling but denies history of CHF.  Denies fever, chills, night sweats, abdominal pain, nausea, vomiting, urinary symptoms, change in bowel habits.  Past medical history significant for hyperlipidemia, hypertension, pulmonary embolism, CKD, diabetes mellitus, anemia of chronic disease  Home Medications Prior to Admission medications   Medication Sig Start Date End Date Taking? Authorizing Provider  allopurinol (ZYLOPRIM) 100 MG tablet Take 1 tablet (100 mg total) by mouth daily. Patient taking differently: Take 100 mg by mouth at bedtime. 02/24/14  Yes Martin, Mary-Margaret, FNP  amLODipine (NORVASC) 10 MG tablet Take 10 mg by mouth daily. 12/20/19  Yes [provider]  atorvastatin (LIPITOR) 40 MG tablet Take 1 tablet (40 mg total) by mouth daily. Patient taking differently: Take 40 mg by mouth at bedtime. 02/24/14  Yes Martin, Mary-Margaret, FNP  carvedilol (COREG) 12.5 MG tablet Take 12.5 mg by mouth 2 (two) times daily with a meal.   Yes [provider]  FEROSUL 325 (65 Fe) MG tablet TAKE ONE TABLET BY MOUTH EVERY MORNING WITH BREAKFAST 12/20/21  Yes Carlan, Chelsea L, NP  furosemide (LASIX) 40 MG tablet Take  40 mg by mouth 2 (two) times daily.   Yes [provider]  Multiple Vitamin (MULTIVITAMIN WITH MINERALS) TABS tablet Take 1 tablet by mouth daily. 02/16/20  Yes Johnson, Clanford L, MD  pantoprazole (PROTONIX) 40 MG tablet Take 1 tablet (40 mg total) by mouth 2 (two) times daily before a meal. 02/07/22  Yes Mansouraty, Telford Nab., MD  Pseudoeph-Doxylamine-DM-APAP (NYQUIL PO) Take 1 Dose by mouth at bedtime as needed (cough).   Yes [provider]  TRULICITY 7.82 NF/6.2ZH SOPN Inject 0.75 mg into the skin every Friday.   Yes [provider]  valsartan (DIOVAN) 40 MG tablet Take 20 mg by mouth daily. 10/11/21  Yes [provider]  epoetin alfa-epbx (RETACRIT) 3000 UNIT/ML injection Inject 3,000 Units into the vein every 14 (fourteen) days.     [provider]  OXYGEN Inhale 2 L into the lungs continuous.    [provider]  potassium chloride (KLOR-CON) 10 MEQ tablet Take 10 mEq by mouth daily. 07/18/22   [provider]      Allergies    Patient has no known allergies.    Review of Systems   Review of Systems  Cardiovascular:  Positive for chest pain.  All other systems reviewed and are negative.   Physical Exam Updated Vital Signs BP (!) 156/92   Pulse 74   Temp 97.6 F (36.4 C) (Oral)   Resp 20   Ht '5\' 9"'$  (1.753 m)   Wt 99.3 kg   SpO2 94%  BMI 32.34 kg/m  Physical Exam Vitals and nursing note reviewed.  Constitutional:      General: He is not in acute distress.    Appearance: He is well-developed.  HENT:     Head: Normocephalic and atraumatic.  Eyes:     Conjunctiva/sclera: Conjunctivae normal.  Cardiovascular:     Rate and Rhythm: Normal rate and regular rhythm.     Heart sounds: No murmur heard. Pulmonary:     Effort: Pulmonary effort is normal.     Breath sounds: Normal breath sounds.     Comments: Patient 88 to 92% on room air upon initial presentation. Abdominal:     Palpations: Abdomen is soft.      Tenderness: There is no abdominal tenderness.  Musculoskeletal:        General: No swelling.     Cervical back: Neck supple. No rigidity or tenderness.     Right lower leg: Edema present.     Left lower leg: Edema present.     Comments: 2+ pitting edema noted in bilateral lower extremities.  Skin:    General: Skin is warm and dry.     Capillary Refill: Capillary refill takes less than 2 seconds.  Neurological:     Mental Status: He is alert.  Psychiatric:        Mood and Affect: Mood normal.     ED Results / Procedures / Treatments   Labs (all labs ordered are listed, but only abnormal results are displayed) Labs Reviewed  BASIC METABOLIC PANEL - Abnormal; Notable for the following components:      Result Value   Glucose, Bld 112 (*)    BUN 26 (*)    Creatinine, Ser 2.15 (*)    Calcium 8.6 (*)    GFR, Estimated 31 (*)    All other components within normal limits  CBC - Abnormal; Notable for the following components:   RBC 3.97 (*)    Hemoglobin 11.2 (*)    HCT 37.4 (*)    MCHC 29.9 (*)    All other components within normal limits  URINALYSIS, ROUTINE W REFLEX MICROSCOPIC - Abnormal; Notable for the following components:   Protein, ur 100 (*)    Leukocytes,Ua TRACE (*)    All other components within normal limits  BRAIN NATRIURETIC PEPTIDE - Abnormal; Notable for the following components:   B Natriuretic Peptide 219.0 (*)    All other components within normal limits  RESP PANEL BY RT-PCR (FLU A&B, COVID) ARPGX2  TROPONIN I (HIGH SENSITIVITY)  TROPONIN I (HIGH SENSITIVITY)    EKG EKG Interpretation  Date/Time:  Thursday August 18 2022 10:49:12 EDT Ventricular Rate:  78 PR Interval:  186 QRS Duration: 98 QT Interval:  400 QTC Calculation: 456 R Axis:   -17 Text Interpretation: Sinus rhythm Atrial premature complex Borderline left axis deviation No acute changes No significant change since last tracing Confirmed by Varney Biles [id number]) on 08/18/2022 12:13:30  PM  Radiology US Venous Img Lower Bilateral (DVT)  Result Date: 08/18/2022 CLINICAL DATA:  Pulmonary emboli, bilateral lower extremity edema EXAM: BILATERAL LOWER EXTREMITY VENOUS DOPPLER ULTRASOUND TECHNIQUE: Gray-scale sonography with graded compression, as well as color Doppler and duplex ultrasound were performed to evaluate the lower extremity deep venous systems from the level of the common femoral vein and including the common femoral, femoral, profunda femoral, popliteal and calf veins including the posterior tibial, peroneal and gastrocnemius veins when visible. The superficial great saphenous vein was also interrogated. Spectral Doppler was utilized  to evaluate flow at rest and with distal augmentation maneuvers in the common femoral, femoral and popliteal veins. COMPARISON:  None Available. FINDINGS: RIGHT LOWER EXTREMITY Common Femoral Vein: No evidence of thrombus. Normal compressibility, respiratory phasicity and response to augmentation. Saphenofemoral Junction: No evidence of thrombus. Normal compressibility and flow on color Doppler imaging. Profunda Femoral Vein: No evidence of thrombus. Normal compressibility and flow on color Doppler imaging. Femoral Vein: No evidence of thrombus. Normal compressibility, respiratory phasicity and response to augmentation. Popliteal Vein: No evidence of thrombus. Normal compressibility, respiratory phasicity and response to augmentation. Calf Veins: No evidence of thrombus. Normal compressibility and flow on color Doppler imaging. Superficial Great Saphenous Vein: No evidence of thrombus. Normal compressibility. Venous Reflux:  None. Other Findings:  None. LEFT LOWER EXTREMITY Common Femoral Vein: No evidence of thrombus. Normal compressibility, respiratory phasicity and response to augmentation. Saphenofemoral Junction: No evidence of thrombus. Normal compressibility and flow on color Doppler imaging. Profunda Femoral Vein: No evidence of thrombus. Normal  compressibility and flow on color Doppler imaging. Femoral Vein: No evidence of thrombus. Normal compressibility, respiratory phasicity and response to augmentation. Popliteal Vein: No evidence of thrombus. Normal compressibility, respiratory phasicity and response to augmentation. Calf Veins: No evidence of thrombus. Normal compressibility and flow on color Doppler imaging. Superficial Great Saphenous Vein: No evidence of thrombus. Normal compressibility. Venous Reflux:  None. Other Findings:  None. IMPRESSION: No evidence of deep venous thrombosis in either lower extremity. Electronically Signed   By: Jacqulynn Cadet M.D.   On: 08/18/2022 16:53   NM Pulmonary Perfusion  Result Date: 08/18/2022 CLINICAL DATA:  Several day history of shortness of breath and chest pain EXAM: NUCLEAR MEDICINE PERFUSION LUNG SCAN TECHNIQUE: Perfusion images were obtained in multiple projections after intravenous injection of radiopharmaceutical. Ventilation scans intentionally deferred if perfusion scan and chest x-ray adequate for interpretation during COVID 19 epidemic. RADIOPHARMACEUTICALS:  4.4 mCi Tc-37mMAA IV COMPARISON:  Chest radiograph dated 08/18/2022 FINDINGS: Peripheral, wedge-shaped perfusion defect in the left lung in the region of the left lower lobe superior segment. IMPRESSION: Pulmonary embolism present. Electronically Signed   By: LDarrin NipperM.D.   On: 08/18/2022 16:47   DG Chest 2 View  Result Date: 08/18/2022 CLINICAL DATA:  76year old male with history of chest pain. Productive cough for the past 3 weeks. EXAM: CHEST - 2 VIEW COMPARISON:  Chest x-ray 10/20/2021. FINDINGS: Lung volumes are normal. Mild chronic elevation of the right hemidiaphragm is unchanged. No consolidative airspace disease. No pleural effusions. No pneumothorax. No pulmonary nodule or mass noted. Pulmonary vasculature and the cardiomediastinal silhouette are within normal limits. IMPRESSION: No radiographic evidence of acute  cardiopulmonary disease. Electronically Signed   By: DVinnie LangtonM.D.   On: 08/18/2022 12:23    Procedures .Critical Care  Performed by: RWilnette Kales PA Authorized by: RWilnette Kales PA   Critical care provider statement:    Critical care time (minutes):  55   Critical care was necessary to treat or prevent imminent or life-threatening deterioration of the following conditions:  Respiratory failure   Critical care was time spent personally by me on the following activities:  Development of treatment plan with patient or surrogate, discussions with consultants, evaluation of patient's response to treatment, examination of patient, ordering and review of laboratory studies, ordering and review of radiographic studies, ordering and performing treatments and interventions, pulse oximetry, re-evaluation of patient's condition and review of old charts   I assumed direction of critical care for this patient  from another provider in my specialty: no     Care discussed with: admitting provider       Medications Ordered in ED Medications  enoxaparin (LOVENOX) injection 100 mg (has no administration in time range)  enoxaparin (LOVENOX) injection 100 mg (100 mg Subcutaneous Given 08/18/22 1453)  technetium albumin aggregated (MAA) injection solution 4.4 millicurie (4.4 millicuries Intravenous Contrast Given 08/18/22 1535)    ED Course/ Medical Decision Making/ A&P Clinical Course as of 08/18/22 1727  Thu Aug 18, 2022  1454 Patient hypoxic with ambulation down to 85% on room air. [CR]  1708 Consulted hospital medicine Dr. Denton Brick regarding the patient.  She agreed with admission of the patient and assume further treatment/care. [CR]    Clinical Course User Index [CR] Wilnette Kales, PA                           Medical Decision Making Amount and/or Complexity of Data Reviewed Labs: ordered. Radiology: ordered.  Risk Prescription drug management. Decision regarding  hospitalization.   This patient presents to the ED for concern of shortness of breath, this involves an extensive number of treatment options, and is a complaint that carries with it a high risk of complications and morbidity.  The differential diagnosis includes The causes for shortness of breath include but are not limited to Cardiac (AHF, pericardial effusion and tamponade, arrhythmias, ischemia, etc) Respiratory (COPD, asthma, pneumonia, pneumothorax, primary pulmonary hypertension, PE/VQ mismatch) Hematological (anemia)  Co morbidities that complicate the patient evaluation  See HPI   Additional history obtained:  Additional history obtained from EMR External records from outside source obtained and reviewed including hospital records   Lab Tests:  I Ordered, and personally interpreted labs.  The pertinent results include: No leukocytosis noted.  Modest anemia with a hemoglobin 11.2; patient has a history of anemia, disease secondary to CKD.  Platelets within normal range.  No electrolyte abnormalities noted.  Renal function within patient's baseline with a GFR of 31, BUN of 26 and creatinine of 2.15.  BNP elevated of 219 likely secondary to patient's kidney disease and not reflected in clinical exam.  Respiratory viral panel negative.  Initial troponin of 8 with second 8; EKG consistent with sinus rhythm with no acute ischemic changes compared to prior studies.   Imaging Studies ordered:  I ordered imaging studies including chest x-ray, DVT study, VQ scan I independently visualized and interpreted imaging which showed  Chest x-ray: No acute cardiopulmonary change. DVT study: No evidence of DVT VQ scan: Pulm embolism present. I agree with the radiologist interpretation   Cardiac Monitoring: / EKG:  The patient was maintained on a cardiac monitor.  I personally viewed and interpreted the cardiac monitored which showed an underlying rhythm of: Sinus rhythm with no acute  ischemic changes   Consultations Obtained:  See ED course  Problem List / ED Course / Critical interventions / Medication management  Acute respiratory failure.  PE I ordered medication including Lovenox for anticoagulation.   Reevaluation of the patient after these medicines showed that the patient improved I have reviewed the patients home medicines and have made adjustments as needed   Social Determinants of Health:  Former cigarette use.  Denies illicit drug use.   Test / Admission - Considered:  Acute respiratory failure with hypoxia secondary to PE Vitals signs significant for hypoxia on room air as well as with ambulation down to 85%.  This increased to 91 to 94%  with 2 L.  Patient also hypertensive with blood pressure 156/92.. Otherwise within normal range and stable throughout visit. Laboratory/imaging studies significant for: See above Patient deemed to meet admission criteria given current acute respiratory failure with hypoxia secondary to pulmonary embolism.  Anticoagulation was begun while emergency department. Treatment plan were discussed at length with patient and they knowledge understanding was agreeable to said plan.  Appropriate consultations were made as described in the ED course.  Patient was stable upon admission to the hospital.         Final Clinical Impression(s) / ED Diagnoses Final diagnoses:  Acute respiratory failure with hypoxia ([place])  Other acute pulmonary embolism, unspecified whether acute cor pulmonale present [facility])    Rx / DC Orders ED Discharge Orders     None         Wilnette Kales, [place] 08/18/22 [place], Ankit, MD 08/19/22 [phone]

## 2022-08-18 NOTE — ED Notes (Signed)
Patient SOB persist O2 sat 87% room air, O2 @ 2L per n/c applied sat increased to 93%.

## 2022-08-18 NOTE — Assessment & Plan Note (Signed)
Sats intermittently dropping to 88 percent. Hx of chronic respiratory failure. -Supplemental O2 as needed

## 2022-08-18 NOTE — H&P (Signed)
History and Physical    Andrew Adams UEA:[id number] DOB: 1946-06-03 DOA: 08/18/2022  PCP: Lavella Lemons, PA   Patient coming from: Home  I have personally briefly reviewed patient's old medical records in [place]  Chief Complaint: Difficulty breathing  HPI: Andrew Adams BELLING is a 76 y.o. male with medical history significant for hypertension, COPD, CHF, CKD 4, diabetes mellitus and PE, OSA. Patient presented to the ED with complaints of left-sided chest pain and difficulty breathing that started yesterday.  He also reports cough productive of large amount of whitish-yellowish sputum over the past few weeks worse in the mornings and when he lies down.  Quit smoking cigarettes over 30 years ago.  Patient works in [employer] and he is very active, on his feet a lot.  He denies use of any hormonal medications.  Denies any recent trips-flights or otherwise.  He reports chronic mostly unchanged bilateral lower extremity swelling over the past year at least, swelling is worse in the evenings.  No recent weight loss. Patient reports a history of PE over 10 years ago, he believes he completed about a year of anticoagulation and then his doctor told him to stop.  No known family history of blood clots.  ED Course: O2 sats intermittently down to 88% on room air.  Temperature 97.6.  Heart rate 60s to 80s.  Respiratory rate 20-27.  Blood pressure systolic 478G to 956O. Troponin 8 X 2. BNP 219.  Creatinine stable at 2.15.  EKG showing sinus rhythm without acute abnormalities. Chest x-ray clear. Bilateral lower extremity venous Dopplers negative for DVT. VQ scan positive for PE. IV Lovenox was started pending VQ scan.  Review of Systems: As per HPI all other systems reviewed and negative.  Past Medical History:  Diagnosis Date   Anemia    Borderline diabetic    Chronic kidney disease    Diabetes mellitus without complication ([place])    Duodenal adenoma    2021   GERD (gastroesophageal  reflux disease)    Gout    Gouty arthritis    H/O small bowel obstruction    History of back surgery    Hx of adenomatous colonic polyps    2021   Hyperlipidemia    Hypertension    PE (pulmonary embolism)    Sleep apnea     Past Surgical History:  Procedure Laterality Date   ABDOMINAL SURGERY  2103   Smal bowel obstruction    ABDOMINAL SURGERY  1981   Perforated large intestine   back     Lower back   BIOPSY  08/28/2020   Procedure: BIOPSY;  Surgeon: Harvel Quale, MD;  [facility];  Service: Gastroenterology;;   BIOPSY  02/07/2022   Procedure: BIOPSY;  Surgeon: Irving Copas., MD;  Location: [facility];  Service: Gastroenterology;;   CATARACT EXTRACTION W/PHACO Right 01/06/2020   Procedure: CATARACT EXTRACTION PHACO AND INTRAOCULAR LENS PLACEMENT (IOC) (CDE: 6.48);  Surgeon: [name], MD;  Location: [facility];  Service: Ophthalmology;  Laterality: Right;   CATARACT EXTRACTION W/PHACO Left 01/20/2020   Procedure: CATARACT EXTRACTION PHACO AND INTRAOCULAR LENS PLACEMENT (IOC);  Surgeon: [name], MD;  Location: [facility];  Service: Ophthalmology;  Laterality: Left;  CDE: 5.86   COLONOSCOPY WITH PROPOFOL N/A 08/28/2020   Procedure: COLONOSCOPY WITH PROPOFOL;  Surgeon: Harvel Quale, MD;  [facility];  Service: Gastroenterology;  Laterality: N/A;  [place] RESECTION N/A 02/07/2022   Procedure: ENDOSCOPIC  MUCOSAL RESECTION;  Surgeon: Rush Landmark Telford Nab., MD;  Location: [facility];  Service: Gastroenterology;  Laterality: N/A;   ESOPHAGOGASTRODUODENOSCOPY N/A 02/07/2022   Procedure: ESOPHAGOGASTRODUODENOSCOPY (EGD);  Surgeon: Irving Copas., MD;  Location: [facility];  Service: Gastroenterology;  Laterality: N/A;   ESOPHAGOGASTRODUODENOSCOPY (EGD) WITH PROPOFOL N/A 08/28/2020   Procedure: ESOPHAGOGASTRODUODENOSCOPY (EGD) WITH PROPOFOL;  Surgeon: Harvel Quale, MD;  [facility];  Service: Gastroenterology;  Laterality: N/A;   ESOPHAGOGASTRODUODENOSCOPY (EGD) WITH PROPOFOL N/A 12/14/2020   Procedure: ESOPHAGOGASTRODUODENOSCOPY (EGD) WITH PROPOFOL;  Surgeon: Rush Landmark Telford Nab., MD;  Location: [facility];  Service: Gastroenterology;  Laterality: N/A;   GIVENS CAPSULE STUDY N/A 09/10/2020   Procedure: GIVENS CAPSULE STUDY;  Surgeon: Harvel Quale, MD;  [facility];  Service: Gastroenterology;  Laterality: N/A;  [place]  12/14/2020   Procedure: HEMOSTASIS CLIP PLACEMENT;  Surgeon: Irving Copas., MD;  Location: [facility];  Service: Gastroenterology;;   HEMOSTASIS CLIP PLACEMENT  02/07/2022   Procedure: HEMOSTASIS CLIP PLACEMENT;  Surgeon: Irving Copas., MD;  Location: [facility];  Service: Gastroenterology;;   HEMOSTASIS CONTROL  12/14/2020   Procedure: HEMOSTASIS CONTROL;  Surgeon: Irving Copas., MD;  Location: [facility];  Service: Gastroenterology;;   HERNIA REPAIR     incisional   HOT HEMOSTASIS N/A 02/07/2022   Procedure: HOT HEMOSTASIS (ARGON PLASMA COAGULATION/BICAP);  Surgeon: Irving Copas., MD;  Location: [facility];  Service: Gastroenterology;  Laterality: N/A;   POLYPECTOMY  08/28/2020   Procedure: POLYPECTOMY;  Surgeon: Harvel Quale, MD;  [facility];  Service: Gastroenterology;;   POLYPECTOMY  12/14/2020   Procedure: POLYPECTOMY;  Surgeon: Irving Copas., MD;  Location: [facility];  Service: Gastroenterology;;   Clide Deutscher  12/14/2020   Procedure: Clide Deutscher;  Surgeon: Mansouraty, Telford Nab., MD;  Location: [facility];  Service: Gastroenterology;;   Maryagnes Amos INJECTION  02/07/2022   Procedure: SUBMUCOSAL LIFTING INJECTION;  Surgeon: Irving Copas., MD;  Location: [facility];  Service: Gastroenterology;;   SUBMUCOSAL TATTOO INJECTION  12/14/2020   Procedure: SUBMUCOSAL TATTOO INJECTION;  Surgeon:  Irving Copas., MD;  Location: [facility];  Service: Gastroenterology;;     reports that he quit smoking about 27 years ago. His smoking use included cigarettes. He has a 38.00 pack-year smoking history. He has never used smokeless tobacco. He reports current alcohol use of about 2.0 standard drinks of alcohol per week. He reports that he does not use drugs.  No Known Allergies  Family History  Problem Relation Age of Onset   Hypertension Sister    Diabetes Sister    Diabetes Sister    Hypertension Mother    Diabetes Mother    Hypertension Father    Ulcers Father    Colon cancer Neg Hx    Esophageal cancer Neg Hx    Stomach cancer Neg Hx    Inflammatory bowel disease Neg Hx    Liver disease Neg Hx    Pancreatic cancer Neg Hx    Rectal cancer Neg Hx     Prior to Admission medications   Medication Sig Start Date End Date Taking? Authorizing Provider  allopurinol (ZYLOPRIM) 100 MG tablet Take 1 tablet (100 mg total) by mouth daily. Patient taking differently: Take 100 mg by mouth at bedtime. 02/24/14  Yes Andrew Adams, Mary-Margaret, FNP  amLODipine (NORVASC) 10 MG tablet Take 10 mg by mouth daily. 12/20/19  Yes [provider]  atorvastatin (LIPITOR) 40 MG tablet Take  1 tablet (40 mg total) by mouth daily. Patient taking differently: Take 40 mg by mouth at bedtime. 02/24/14  Yes Andrew Adams, Mary-Margaret, FNP  carvedilol (COREG) 12.5 MG tablet Take 12.5 mg by mouth 2 (two) times daily with a meal.   Yes [provider]  FEROSUL 325 (65 Fe) MG tablet TAKE ONE TABLET BY MOUTH EVERY MORNING WITH BREAKFAST 12/20/21  Yes Carlan, Chelsea L, NP  furosemide (LASIX) 40 MG tablet Take 40 mg by mouth 2 (two) times daily.   Yes [provider]  Multiple Vitamin (MULTIVITAMIN WITH MINERALS) TABS tablet Take 1 tablet by mouth daily. 02/16/20  Yes Johnson, Clanford L, MD  pantoprazole (PROTONIX) 40 MG tablet Take 1 tablet (40 mg total) by mouth 2 (two) times daily before a  meal. 02/07/22  Yes Mansouraty, Telford Nab., MD  Pseudoeph-Doxylamine-DM-APAP (NYQUIL PO) Take 1 Dose by mouth at bedtime as needed (cough).   Yes [provider]  TRULICITY 5.39 JQ/7.3AL SOPN Inject 0.75 mg into the skin every Friday.   Yes [provider]  valsartan (DIOVAN) 40 MG tablet Take 20 mg by mouth daily. 10/11/21  Yes [provider]  epoetin alfa-epbx (RETACRIT) 3000 UNIT/ML injection Inject 3,000 Units into the vein every 14 (fourteen) days.     [provider]  OXYGEN Inhale 2 L into the lungs continuous.    [provider]  potassium chloride (KLOR-CON) 10 MEQ tablet Take 10 mEq by mouth daily. 07/18/22   [provider]    Physical Exam: Vitals:   08/18/22 1417 08/18/22 1420 08/18/22 1859 08/18/22 2017  BP:    (!) 157/95  Pulse: 74   82  Resp: 20   (!) 27  Temp:  97.6 F (36.4 C) 97.8 F (36.6 C) 98.2 F (36.8 C)  TempSrc:  Oral Oral Oral  SpO2: 94%   (!) 88%  Weight:      Height:        Constitutional: NAD, calm, comfortable Vitals:   08/18/22 1417 08/18/22 1420 08/18/22 1859 08/18/22 2017  BP:    (!) 157/95  Pulse: 74   82  Resp: 20   (!) 27  Temp:  97.6 F (36.4 C) 97.8 F (36.6 C) 98.2 F (36.8 C)  TempSrc:  Oral Oral Oral  SpO2: 94%   (!) 88%  Weight:      Height:       Eyes: PERRL, lids and conjunctivae normal ENMT: Mucous membranes are moist.   Neck: normal, supple, no masses, no thyromegaly Respiratory: clear to auscultation bilaterally, no wheezing, no crackles. Normal respiratory effort. No accessory muscle use.  Cardiovascular: Regular rate and rhythm, no murmurs / rubs / gallops.  Chronic and unchanged trace pitting bilateral lower extremity edema.  Abdomen: no tenderness, no masses palpated. No hepatosplenomegaly. Bowel sounds positive.  Musculoskeletal: no clubbing / cyanosis. No joint deformity upper and lower extremities. Skin: no rashes, lesions, ulcers. No induration Neurologic: No  apparent cranial nerve abnormality moving extremities spontaneously Psychiatric: Normal judgment and insight. Alert and oriented x 3. Normal mood.   Labs on Admission: I have personally reviewed following labs and imaging studies  CBC: Recent Labs  Lab 08/18/22 1111  WBC 4.4  HGB 11.2*  HCT 37.4*  MCV 94.2  PLT 937   Basic Metabolic Panel: Recent Labs  Lab 08/18/22 1111  NA 138  K 4.0  CL 101  CO2 28  GLUCOSE 112*  BUN 26*  CREATININE 2.15*  CALCIUM 8.6*   Urine  analysis:    Component Value Date/Time   COLORURINE YELLOW 08/18/2022 1116   APPEARANCEUR CLEAR 08/18/2022 1116   LABSPEC 1.009 08/18/2022 1116   PHURINE 6.0 08/18/2022 1116   GLUCOSEU NEGATIVE 08/18/2022 1116   [place] 08/18/2022 1116   [place] 08/18/2022 1116   BILIRUBINUR negative 01/20/2014 1032   [place] 08/18/2022 1116   PROTEINUR 100 (A) 08/18/2022 1116   UROBILINOGEN negative 01/20/2014 1032   NITRITE NEGATIVE 08/18/2022 1116   LEUKOCYTESUR TRACE (A) 08/18/2022 1116    Radiological Exams on Admission: US Venous Img Lower Bilateral (DVT)  Result Date: 08/18/2022 CLINICAL DATA:  Pulmonary emboli, bilateral lower extremity edema EXAM: BILATERAL LOWER EXTREMITY VENOUS DOPPLER ULTRASOUND TECHNIQUE: Gray-scale sonography with graded compression, as well as color Doppler and duplex ultrasound were performed to evaluate the lower extremity deep venous systems from the level of the common femoral vein and including the common femoral, femoral, profunda femoral, popliteal and calf veins including the posterior tibial, peroneal and gastrocnemius veins when visible. The superficial great saphenous vein was also interrogated. Spectral Doppler was utilized to evaluate flow at rest and with distal augmentation maneuvers in the common femoral, femoral and popliteal veins. COMPARISON:  None Available. FINDINGS: RIGHT LOWER EXTREMITY Common Femoral Vein: No evidence of thrombus. Normal  compressibility, respiratory phasicity and response to augmentation. Saphenofemoral Junction: No evidence of thrombus. Normal compressibility and flow on color Doppler imaging. Profunda Femoral Vein: No evidence of thrombus. Normal compressibility and flow on color Doppler imaging. Femoral Vein: No evidence of thrombus. Normal compressibility, respiratory phasicity and response to augmentation. Popliteal Vein: No evidence of thrombus. Normal compressibility, respiratory phasicity and response to augmentation. Calf Veins: No evidence of thrombus. Normal compressibility and flow on color Doppler imaging. Superficial Great Saphenous Vein: No evidence of thrombus. Normal compressibility. Venous Reflux:  None. Other Findings:  None. LEFT LOWER EXTREMITY Common Femoral Vein: No evidence of thrombus. Normal compressibility, respiratory phasicity and response to augmentation. Saphenofemoral Junction: No evidence of thrombus. Normal compressibility and flow on color Doppler imaging. Profunda Femoral Vein: No evidence of thrombus. Normal compressibility and flow on color Doppler imaging. Femoral Vein: No evidence of thrombus. Normal compressibility, respiratory phasicity and response to augmentation. Popliteal Vein: No evidence of thrombus. Normal compressibility, respiratory phasicity and response to augmentation. Calf Veins: No evidence of thrombus. Normal compressibility and flow on color Doppler imaging. Superficial Great Saphenous Vein: No evidence of thrombus. Normal compressibility. Venous Reflux:  None. Other Findings:  None. IMPRESSION: No evidence of deep venous thrombosis in either lower extremity. Electronically Signed   By: Jacqulynn Cadet M.D.   On: 08/18/2022 16:53   NM Pulmonary Perfusion  Result Date: 08/18/2022 CLINICAL DATA:  Several day history of shortness of breath and chest pain EXAM: NUCLEAR MEDICINE PERFUSION LUNG SCAN TECHNIQUE: Perfusion images were obtained in multiple projections after  intravenous injection of radiopharmaceutical. Ventilation scans intentionally deferred if perfusion scan and chest x-ray adequate for interpretation during COVID 19 epidemic. RADIOPHARMACEUTICALS:  4.4 mCi Tc-90mMAA IV COMPARISON:  Chest radiograph dated 08/18/2022 FINDINGS: Peripheral, wedge-shaped perfusion defect in the left lung in the region of the left lower lobe superior segment. IMPRESSION: Pulmonary embolism present. Electronically Signed   By: LDarrin NipperM.D.   On: 08/18/2022 16:47   DG Chest 2 View  Result Date: 08/18/2022 CLINICAL DATA:  76year old male with history of chest pain. Productive cough for the past 3 weeks. EXAM: CHEST - 2 VIEW COMPARISON:  Chest x-ray 10/20/2021. FINDINGS: Lung volumes are normal.  Mild chronic elevation of the right hemidiaphragm is unchanged. No consolidative airspace disease. No pleural effusions. No pneumothorax. No pulmonary nodule or mass noted. Pulmonary vasculature and the cardiomediastinal silhouette are within normal limits. IMPRESSION: No radiographic evidence of acute cardiopulmonary disease. Electronically Signed   By: Vinnie Langton M.D.   On: 08/18/2022 12:23    EKG: Independently reviewed.  Sinus rhythm rate 78, QTc 456.  No significant ST or T wave abnormalities.  Assessment/Plan Principal Problem:   Pulmonary embolism (HCC) Active Problems:   CKD (chronic kidney disease), stage IV (HCC)   Acute respiratory failure with hypoxia (HCC)   Essential hypertension   Moderate obstructive sleep apnea   COPD (chronic obstructive pulmonary disease) (HCC)   Diabetes ([place])   Assessment and Plan: * Pulmonary embolism ([place]) pulmonary embolism appears unprovoked.  With intermittent hypoxia.  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  History of PE at least 10 years ago for which she was on anticoagulation for about a year.  CKD 4 so V/Q was done which showed a peripheral wedge-shaped perfusion  defect in left lung and left lower lobe segments.   -Bilateral lower extremity venous Dopplers negative for DVT -Obtain echo -IV Lovenox started, will continue  CKD (chronic kidney disease), stage IV (HCC) Creatinine 2.15, improved compared to prior.  Recent baseline 2.2-2.6.  Acute respiratory failure with hypoxia (HCC) Sats intermittently dropping to 88 percent. Hx of chronic respiratory failure. -Supplemental O2 as needed  Chronic diastolic CHF (congestive heart failure) (HCC) Chronic lower extremity swelling unchanged, chest x-ray clear.  BNP 219, stable compared to prior.  Last echo 2020, EF 55 to 60% with grade 1 DD. -Continue home Lasix 40 twice daily.  Diabetes ([place]) - HgbA1c - SSi- S - Hold trulicity  COPD (chronic obstructive pulmonary disease) (HCC) Stable.  Quit smoking cigarettes over 30 years ago.  Moderate obstructive sleep apnea CPAP nightly  Essential hypertension Resume Norvasc, carvedilol, valsartan   DVT prophylaxis: Lovenox Code Status: Full code Family Communication: None at bedside Disposition Plan: ~ 2 days Consults called: None Admission status: Inpatient, telemetry I certify that at the point of admission it is my clinical judgment that the patient will require inpatient hospital care spanning beyond 2 midnights from the point of admission due to high intensity of service, high risk for further deterioration and high frequency of surveillance required.   Author: Bethena Roys, MD 08/18/2022 9:12 PM  For on call review [email or web address].

## 2022-08-18 NOTE — ED Triage Notes (Signed)
Pt c/o chest pain since yesterday. No n/v noted and chest pain is lower left. Pt c/o productive cough for a few weeks.

## 2022-08-18 NOTE — ED Notes (Signed)
Pulse oximetry monitored while pt ambulated to restroom. Oxygen saturation decreased to 86%-87%. Pt became noticeably short of breath.

## 2022-08-18 NOTE — Assessment & Plan Note (Signed)
Chronic lower extremity swelling unchanged, chest x-ray clear.  BNP 219, stable compared to prior.  Last echo 2020, EF 55 to 60% with grade 1 DD. -Continue home Lasix 40 twice daily.

## 2022-08-19 DIAGNOSIS — I5032 Chronic diastolic (congestive) heart failure: Secondary | ICD-10-CM | POA: Diagnosis not present

## 2022-08-19 DIAGNOSIS — N184 Chronic kidney disease, stage 4 (severe): Secondary | ICD-10-CM | POA: Diagnosis not present

## 2022-08-19 DIAGNOSIS — J9601 Acute respiratory failure with hypoxia: Secondary | ICD-10-CM | POA: Diagnosis not present

## 2022-08-19 DIAGNOSIS — I2699 Other pulmonary embolism without acute cor pulmonale: Secondary | ICD-10-CM | POA: Diagnosis not present

## 2022-08-19 DIAGNOSIS — E119 Type 2 diabetes mellitus without complications: Secondary | ICD-10-CM

## 2022-08-19 LAB — CBC
HCT: 32.8 % — ABNORMAL LOW (ref 39.0–52.0)
Hemoglobin: 10.7 g/dL — ABNORMAL LOW (ref 13.0–17.0)
MCH: 28.3 pg (ref 26.0–34.0)
MCHC: 32.6 g/dL (ref 30.0–36.0)
MCV: 86.8 fL (ref 80.0–100.0)
Platelets: 167 10*3/uL (ref 150–400)
RBC: 3.78 MIL/uL — ABNORMAL LOW (ref 4.22–5.81)
RDW: 13.5 % (ref 11.5–15.5)
WBC: 4.9 10*3/uL (ref 4.0–10.5)
nRBC: 0 % (ref 0.0–0.2)

## 2022-08-19 LAB — GLUCOSE, CAPILLARY: Glucose-Capillary: 204 mg/dL — ABNORMAL HIGH (ref 70–99)

## 2022-08-19 LAB — BASIC METABOLIC PANEL
Anion gap: 7 (ref 5–15)
BUN: 22 mg/dL (ref 8–23)
CO2: 32 mmol/L (ref 22–32)
Calcium: 8.6 mg/dL — ABNORMAL LOW (ref 8.9–10.3)
Chloride: 102 mmol/L (ref 98–111)
Creatinine, Ser: 1.97 mg/dL — ABNORMAL HIGH (ref 0.61–1.24)
GFR, Estimated: 34 mL/min — ABNORMAL LOW (ref >60)
Glucose, Bld: 91 mg/dL (ref 70–99)
Potassium: 3.9 mmol/L (ref 3.5–5.1)
Sodium: 141 mmol/L (ref 135–145)

## 2022-08-19 LAB — HEMOGLOBIN A1C
Hgb A1c MFr Bld: 6.4 % — ABNORMAL HIGH (ref 4.8–5.6)
Mean Plasma Glucose: 136.98 mg/dL

## 2022-08-19 LAB — CBG MONITORING, ED
Glucose-Capillary: 80 mg/dL (ref 70–99)
Glucose-Capillary: 114 mg/dL — ABNORMAL HIGH (ref 70–99)

## 2022-08-19 MED ORDER — METHYLPREDNISOLONE SODIUM SUCC 40 MG IJ SOLR
40.0000 mg | Freq: Two times a day (BID) | INTRAMUSCULAR | Status: DC
  Administered 2022-08-19 (×2): 40 mg via INTRAVENOUS
  Filled 2022-08-19 (×2): qty 1

## 2022-08-19 MED ORDER — FERROUS SULFATE 325 (65 FE) MG PO TABS
325.0000 mg | ORAL_TABLET | Freq: Every day | ORAL | Status: DC
  Administered 2022-08-19 – 2022-08-20 (×2): 325 mg via ORAL
  Filled 2022-08-19 (×2): qty 1

## 2022-08-19 MED ORDER — BUDESONIDE 0.5 MG/2ML IN SUSP
0.5000 mg | Freq: Two times a day (BID) | RESPIRATORY_TRACT | Status: DC
  Administered 2022-08-19 – 2022-08-20 (×2): 0.5 mg via RESPIRATORY_TRACT
  Filled 2022-08-19 (×3): qty 2

## 2022-08-19 MED ORDER — IPRATROPIUM-ALBUTEROL 0.5-2.5 (3) MG/3ML IN SOLN
3.0000 mL | Freq: Four times a day (QID) | RESPIRATORY_TRACT | Status: DC
  Administered 2022-08-19 – 2022-08-20 (×5): 3 mL via RESPIRATORY_TRACT
  Filled 2022-08-19 (×5): qty 3

## 2022-08-19 NOTE — Hospital Course (Signed)
76 year old male with a history of hypertension, COPD, CKD stage IV, diabetes mellitus type 2, pulmonary embolus 10 years prior to this admission, OSA presenting with 1 day history of chest pain or shortness of breath.  The patient states that he has actually had shortness of breath for the past 6 months, but this has worsened in the past day or 2 prior to admission.  He describes substernal chest pain without any radiation.  He denies any syncope.  He denies any hemoptysis but he does have chronic cough with yellow-white sputum.  He denies any fevers, chills, headache, nausea, vomiting, direct abdominal pain.  The patient states that he quit smoking about 4 years prior to this admission after a 30-pack-year history.  He states that his lower extremity edema is about the same as usual.  He denies any worsening orthopnea.  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 In the emergency department, the patient was afebrile and hemodynamically stable with oxygen saturation 95% on 2 L.  WBC 4.4, hemoglobin 11.2, platelets 1 65,000.  BMP showed sodium 141, potassium 3.9, bicarbonate 32, serum creatinine 1.97.  Troponin was 8.  VQ scan showed a peripheral wedge-shaped perfusion defect in the LLL.  The patient was started on subcutaneous Lovenox.

## 2022-08-19 NOTE — Progress Notes (Signed)
Called ED for pt report x2, staff member unavailable x2.

## 2022-08-19 NOTE — Progress Notes (Signed)
PROGRESS NOTE  Andrew Adams IEP:[id number] DOB: August 14, 1946 DOA: 08/18/2022 PCP: Lavella Lemons, PA  Brief History:  76 year old male with a history of hypertension, COPD, CKD stage IV, diabetes mellitus type 2, pulmonary embolus 10 years prior to this admission, OSA presenting with 1 day history of chest pain or shortness of breath.  The patient states that he has actually had shortness of breath for the past 6 months, but this has worsened in the past day or 2 prior to admission.  He describes substernal chest pain without any radiation.  He denies any syncope.  He denies any hemoptysis but he does have chronic cough with yellow-white sputum.  He denies any fevers, chills, headache, nausea, vomiting, direct abdominal pain.  The patient states that he quit smoking about 4 years prior to this admission after a 30-pack-year history.  He states that his lower extremity edema is about the same as usual.  He denies any worsening orthopnea.  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 In the emergency department, the patient was afebrile and hemodynamically stable with oxygen saturation 95% on 2 L.  WBC 4.4, hemoglobin 11.2, platelets 1 65,000.  BMP showed sodium 141, potassium 3.9, bicarbonate 32, serum creatinine 1.97.  Troponin was 8.  VQ scan showed a peripheral wedge-shaped perfusion defect in the LLL.  The patient was started on subcutaneous Lovenox.     Assessment and Plan: Acute pulmonary embolus -Unprovoked by clinical history -Patient had history of PE 10 years ago for which she took a year of anticoagulation -08/18/2022 VQ scan--positive for PE as discussed above -Started on [place] Lovenox -Echo  Acute respiratory failure with hypoxia -Secondary to PE and COPD exacerbation -Stable on 2 L nasal cannula -Presented with oxygen saturation 88% on room air with tachypnea -Wean oxygen as tolerated back to room air  COPD exacerbation -Start IV Solu-Medrol -Start  DuoNebs -Start Pulmicort  CKD 4 -Baseline creatinine 2.1-2.4  Controlled diabetes mellitus type 2 -Holding Trulicity -66/06/30 hemoglobin A1c--6.4 -NovoLog sliding scale  Chronic diastolic CHF -Continue home dose furosemide 40 mg twice daily -Patient has chronic lower extremity edema which she states is unchanged -Personally reviewed chest x-ray--no pulmonary edema or infiltrates  Essential hypertension -Continue amlodipine and carvedilol -Holding ARB    Family Communication:  no  Family at bedside  Consultants:  none  Code Status:  FULL   DVT Prophylaxis:  [place] Lovenox   Procedures: As Listed in Progress Note Above  Antibiotics: None       Subjective: Patient complains of shortness of breath with minimal exertion.  He states his breathing is a little bit better.  He denies any chest pain, nausea, vomiting, diarrhea, abdominal pain.  Objective: Vitals:   08/19/22 0929 08/19/22 1000 08/19/22 1015 08/19/22 1030  BP:  133/87  136/84  Pulse:  70 72 67  Resp:  20 (!) 22   Temp: 97.7 F (36.5 C)     TempSrc:      SpO2:  98% 95% 91%  Weight:      Height:       No intake or output data in the 24 hours ending 08/19/22 1106 Weight change:  Exam:  General:  Pt is alert, follows commands appropriately, not in acute distress HEENT: No icterus, No thrush, No neck mass, [place]/AT Cardiovascular: RRR, S1/S2, no rubs, no gallops Respiratory: Diminished breath sounds bilateral.  Bibasilar rales.  Bilateral expiratory wheeze. Abdomen: Soft/+BS, non  tender, non distended, no guarding Extremities: 1 + LE edema, No lymphangitis, No petechiae, No rashes, no synovitis   Data Reviewed: I have personally reviewed following labs and imaging studies Basic Metabolic Panel: Recent Labs  Lab 08/18/22 1111 08/19/22 0333  NA 138 141  K 4.0 3.9  CL 101 102  CO2 28 32  GLUCOSE 112* 91  BUN 26* 22  CREATININE 2.15* 1.97*  CALCIUM 8.6* 8.6*   Liver Function Tests: No  results for input(s): "AST", "ALT", "ALKPHOS", "BILITOT", "PROT", "ALBUMIN" in the last 168 hours. No results for input(s): "LIPASE", "AMYLASE" in the last 168 hours. No results for input(s): "AMMONIA" in the last 168 hours. Coagulation Profile: No results for input(s): "INR", "PROTIME" in the last 168 hours. CBC: Recent Labs  Lab 08/18/22 1111 08/19/22 0333  WBC 4.4 4.9  HGB 11.2* 10.7*  HCT 37.4* 32.8*  MCV 94.2 86.8  PLT 165 167   Cardiac Enzymes: No results for input(s): "CKTOTAL", "CKMB", "CKMBINDEX", "TROPONINI" in the last 168 hours. BNP: Invalid input(s): "POCBNP" CBG: Recent Labs  Lab 08/19/22 0735  GLUCAP 80   HbA1C: Recent Labs    08/19/22 0333  HGBA1C 6.4*   Urine analysis:    Component Value Date/Time   COLORURINE YELLOW 08/18/2022 1116   APPEARANCEUR CLEAR 08/18/2022 1116   LABSPEC 1.009 08/18/2022 1116   PHURINE 6.0 08/18/2022 1116   GLUCOSEU NEGATIVE 08/18/2022 1116   [place] 08/18/2022 1116   [place] 08/18/2022 1116   BILIRUBINUR negative 01/20/2014 [place] 08/18/2022 1116   PROTEINUR 100 (A) 08/18/2022 1116   UROBILINOGEN negative 01/20/2014 1032   NITRITE NEGATIVE 08/18/2022 1116   LEUKOCYTESUR TRACE (A) 08/18/2022 1116   Sepsis Labs: '@LABRCNTIP'$ (procalcitonin:4,lacticidven:4) ) Recent Results (from the past 240 hour(s))  Resp Panel by RT-PCR (Flu A&B, Covid) Anterior Nasal Swab     Status: None   Collection Time: 08/18/22 12:01 PM   Specimen: Anterior Nasal Swab  Result Value Ref Range Status   SARS Coronavirus 2 by RT PCR NEGATIVE NEGATIVE Final    Comment: (NOTE) SARS-CoV-2 target nucleic acids are NOT DETECTED.  The SARS-CoV-2 RNA is generally detectable in upper respiratory specimens during the acute phase of infection. The lowest concentration of SARS-CoV-2 viral copies this assay can detect is 138 copies/mL. A negative result does not preclude SARS-Cov-2 infection and should not be used as  the sole basis for treatment or other patient management decisions. A negative result may occur with  improper specimen collection/handling, submission of specimen other than nasopharyngeal swab, presence of viral mutation(s) within the areas targeted by this assay, and inadequate number of viral copies(<138 copies/mL). A negative result must be combined with clinical observations, patient history, and epidemiological information. The expected result is Negative.  Fact Sheet for Patients:  EntrepreneurPulse.com.au  Fact Sheet for Healthcare Providers:  IncredibleEmployment.be  This test is no t yet approved or cleared by the [place] FDA and  has been authorized for detection and/or diagnosis of SARS-CoV-2 by FDA under an Emergency Use Authorization (EUA). This EUA will remain  in effect (meaning this test can be used) for the duration of the COVID-19 declaration under Section 564(b)(1) of the Act, 21 U.S.C.section 360bbb-3(b)(1), unless the authorization is terminated  or revoked sooner.       Influenza A by PCR NEGATIVE NEGATIVE Final   Influenza B by PCR NEGATIVE NEGATIVE Final    Comment: (NOTE) The Xpert Xpress SARS-CoV-2/FLU/RSV plus assay is intended as an aid in  the diagnosis of influenza from Nasopharyngeal swab specimens and should not be used as a sole basis for treatment. Nasal washings and aspirates are unacceptable for Xpert Xpress SARS-CoV-2/FLU/RSV testing.  Fact Sheet for Patients: EntrepreneurPulse.com.au  Fact Sheet for Healthcare Providers: IncredibleEmployment.be  This test is not yet approved or cleared by the [place] FDA and has been authorized for detection and/or diagnosis of SARS-CoV-2 by FDA under an Emergency Use Authorization (EUA). This EUA will remain in effect (meaning this test can be used) for the duration of the COVID-19 declaration under Section 564(b)(1) of  the Act, 21 U.S.C. section 360bbb-3(b)(1), unless the authorization is terminated or revoked.  Performed at [facility], [address], [place], [place] [address]      Scheduled Meds:  allopurinol  100 mg Oral QHS   amLODipine  10 mg Oral Daily   atorvastatin  40 mg Oral QHS   carvedilol  12.5 mg Oral BID WC   enoxaparin (LOVENOX) injection  100 mg Subcutaneous Q12H   ferrous sulfate  325 mg Oral Q breakfast   fluticasone  2 spray Each Nare Daily   furosemide  40 mg Oral BID   guaiFENesin-dextromethorphan  10 mL Oral Q8H   insulin aspart  0-5 Units Subcutaneous QHS   insulin aspart  0-9 Units Subcutaneous TID WC   irbesartan  75 mg Oral Daily   pantoprazole  40 mg Oral BID AC   Continuous Infusions:  Procedures/Studies: US Venous Img Lower Bilateral (DVT)  Result Date: 08/18/2022 CLINICAL DATA:  Pulmonary emboli, bilateral lower extremity edema EXAM: BILATERAL LOWER EXTREMITY VENOUS DOPPLER ULTRASOUND TECHNIQUE: Gray-scale sonography with graded compression, as well as color Doppler and duplex ultrasound were performed to evaluate the lower extremity deep venous systems from the level of the common femoral vein and including the common femoral, femoral, profunda femoral, popliteal and calf veins including the posterior tibial, peroneal and gastrocnemius veins when visible. The superficial great saphenous vein was also interrogated. Spectral Doppler was utilized to evaluate flow at rest and with distal augmentation maneuvers in the common femoral, femoral and popliteal veins. COMPARISON:  None Available. FINDINGS: RIGHT LOWER EXTREMITY Common Femoral Vein: No evidence of thrombus. Normal compressibility, respiratory phasicity and response to augmentation. Saphenofemoral Junction: No evidence of thrombus. Normal compressibility and flow on color Doppler imaging. Profunda Femoral Vein: No evidence of thrombus. Normal compressibility and flow on color Doppler imaging. Femoral Vein: No  evidence of thrombus. Normal compressibility, respiratory phasicity and response to augmentation. Popliteal Vein: No evidence of thrombus. Normal compressibility, respiratory phasicity and response to augmentation. Calf Veins: No evidence of thrombus. Normal compressibility and flow on color Doppler imaging. Superficial Great Saphenous Vein: No evidence of thrombus. Normal compressibility. Venous Reflux:  None. Other Findings:  None. LEFT LOWER EXTREMITY Common Femoral Vein: No evidence of thrombus. Normal compressibility, respiratory phasicity and response to augmentation. Saphenofemoral Junction: No evidence of thrombus. Normal compressibility and flow on color Doppler imaging. Profunda Femoral Vein: No evidence of thrombus. Normal compressibility and flow on color Doppler imaging. Femoral Vein: No evidence of thrombus. Normal compressibility, respiratory phasicity and response to augmentation. Popliteal Vein: No evidence of thrombus. Normal compressibility, respiratory phasicity and response to augmentation. Calf Veins: No evidence of thrombus. Normal compressibility and flow on color Doppler imaging. Superficial Great Saphenous Vein: No evidence of thrombus. Normal compressibility. Venous Reflux:  None. Other Findings:  None. IMPRESSION: No evidence of deep venous thrombosis in either lower extremity. Electronically Signed   By: Dellis Filbert.D.  On: 08/18/2022 16:53   NM Pulmonary Perfusion  Result Date: 08/18/2022 CLINICAL DATA:  Several day history of shortness of breath and chest pain EXAM: NUCLEAR MEDICINE PERFUSION LUNG SCAN TECHNIQUE: Perfusion images were obtained in multiple projections after intravenous injection of radiopharmaceutical. Ventilation scans intentionally deferred if perfusion scan and chest x-ray adequate for interpretation during COVID 19 epidemic. RADIOPHARMACEUTICALS:  4.4 mCi Tc-87mMAA IV COMPARISON:  Chest radiograph dated 08/18/2022 FINDINGS: Peripheral, wedge-shaped  perfusion defect in the left lung in the region of the left lower lobe superior segment. IMPRESSION: Pulmonary embolism present. Electronically Signed   By: LDarrin NipperM.D.   On: 08/18/2022 16:47   DG Chest 2 View  Result Date: 08/18/2022 CLINICAL DATA:  76year old male with history of chest pain. Productive cough for the past 3 weeks. EXAM: CHEST - 2 VIEW COMPARISON:  Chest x-ray 10/20/2021. FINDINGS: Lung volumes are normal. Mild chronic elevation of the right hemidiaphragm is unchanged. No consolidative airspace disease. No pleural effusions. No pneumothorax. No pulmonary nodule or mass noted. Pulmonary vasculature and the cardiomediastinal silhouette are within normal limits. IMPRESSION: No radiographic evidence of acute cardiopulmonary disease. Electronically Signed   By: DVinnie LangtonM.D.   On: 08/18/2022 12:23    DOrson Eva DO  [facility]  If 7PM-7AM, please contact night-coverage [email or web address] Password [email or web address] 08/19/2022, 11:06 AM   LOS: 1 day

## 2022-08-19 NOTE — TOC Progression Note (Signed)
  Transition of Care [facility]) Screening Note   Patient Details  Name: KANEN MOTTOLA Date of Birth: [birth year]   [facility] CM/SW Contact:    Shade Flood, LCSW Phone Number: 08/19/2022, 9:06 AM    Transition of Care Department [facility]) has reviewed patient and no TOC needs have been identified at this time. We will continue to monitor patient advancement through interdisciplinary progression rounds. If new patient transition needs arise, please place a TOC consult.

## 2022-08-19 NOTE — Progress Notes (Signed)
Pt arrived to room #337 via wheelchair from ED. Pt ambulatory to bed from [facility], dyspneic on exertion. O2 on @ 2lpm [place]. Pt oriented to room and safety procedures, states understanding.

## 2022-08-20 ENCOUNTER — Inpatient Hospital Stay (HOSPITAL_COMMUNITY): Payer: Medicare HMO

## 2022-08-20 DIAGNOSIS — I2699 Other pulmonary embolism without acute cor pulmonale: Secondary | ICD-10-CM

## 2022-08-20 DIAGNOSIS — I5032 Chronic diastolic (congestive) heart failure: Secondary | ICD-10-CM | POA: Diagnosis not present

## 2022-08-20 DIAGNOSIS — N184 Chronic kidney disease, stage 4 (severe): Secondary | ICD-10-CM | POA: Diagnosis not present

## 2022-08-20 DIAGNOSIS — R0602 Shortness of breath: Secondary | ICD-10-CM

## 2022-08-20 DIAGNOSIS — J9601 Acute respiratory failure with hypoxia: Secondary | ICD-10-CM | POA: Diagnosis not present

## 2022-08-20 LAB — ECHOCARDIOGRAM COMPLETE
AR max vel: 2.28 cm2
AV Area VTI: 2.31 cm2
AV Area mean vel: 2.24 cm2
AV Mean grad: 7.5 mmHg
AV Peak grad: 15.3 mmHg
Ao pk vel: 1.96 m/s
Area-P 1/2: 2.59 cm2
Height: 69 in
MV VTI: 4.18 cm2
S' Lateral: 3.1 cm
Weight: 3379.21 oz

## 2022-08-20 LAB — CBC
HCT: 33 % — ABNORMAL LOW (ref 39.0–52.0)
Hemoglobin: 10.9 g/dL — ABNORMAL LOW (ref 13.0–17.0)
MCH: 28.5 pg (ref 26.0–34.0)
MCHC: 33 g/dL (ref 30.0–36.0)
MCV: 86.2 fL (ref 80.0–100.0)
Platelets: 179 10*3/uL (ref 150–400)
RBC: 3.83 MIL/uL — ABNORMAL LOW (ref 4.22–5.81)
RDW: 13.2 % (ref 11.5–15.5)
WBC: 6.1 10*3/uL (ref 4.0–10.5)
nRBC: 0 % (ref 0.0–0.2)

## 2022-08-20 LAB — BASIC METABOLIC PANEL
Anion gap: 7 (ref 5–15)
BUN: 26 mg/dL — ABNORMAL HIGH (ref 8–23)
CO2: 32 mmol/L (ref 22–32)
Calcium: 8.7 mg/dL — ABNORMAL LOW (ref 8.9–10.3)
Chloride: 101 mmol/L (ref 98–111)
Creatinine, Ser: 1.93 mg/dL — ABNORMAL HIGH (ref 0.61–1.24)
GFR, Estimated: 35 mL/min — ABNORMAL LOW (ref >60)
Glucose, Bld: 154 mg/dL — ABNORMAL HIGH (ref 70–99)
Potassium: 4.2 mmol/L (ref 3.5–5.1)
Sodium: 140 mmol/L (ref 135–145)

## 2022-08-20 LAB — GLUCOSE, CAPILLARY
Glucose-Capillary: 147 mg/dL — ABNORMAL HIGH (ref 70–99)
Glucose-Capillary: 155 mg/dL — ABNORMAL HIGH (ref 70–99)

## 2022-08-20 MED ORDER — PREDNISONE 20 MG PO TABS: 50.0000 mg | ORAL_TABLET | Freq: Every day | ORAL | Status: DC

## 2022-08-20 MED ORDER — APIXABAN 5 MG PO TABS: 10.0000 mg | ORAL_TABLET | Freq: Two times a day (BID) | ORAL | Status: DC

## 2022-08-20 MED ORDER — APIXABAN 5 MG PO TABS: 10.0000 mg | ORAL_TABLET | Freq: Two times a day (BID) | ORAL | 1 refills | Status: DC

## 2022-08-20 MED ORDER — IPRATROPIUM-ALBUTEROL 0.5-2.5 (3) MG/3ML IN SOLN: 3.0000 mL | Freq: Four times a day (QID) | RESPIRATORY_TRACT | 1 refills | Status: DC

## 2022-08-20 MED ORDER — APIXABAN 5 MG PO TABS: 5.0000 mg | ORAL_TABLET | Freq: Two times a day (BID) | ORAL | Status: DC

## 2022-08-20 MED ORDER — PREDNISONE 50 MG PO TABS: 50.0000 mg | ORAL_TABLET | Freq: Every day | ORAL | 0 refills | Status: DC

## 2022-08-20 NOTE — Discharge Summary (Addendum)
Physician Discharge Summary   Patient: Andrew Adams MRN: [id number] DOB: 1946/03/19  Admit date:     08/18/2022  Discharge date: 08/20/22  Discharge Physician: Shanon Brow Lovett Coffin   PCP: Lavella Lemons, PA   Recommendations at discharge:   Please follow up with primary care provider within 1-2 weeks  Please repeat BMP and CBC in one week    Hospital Course: 76 year old male with a history of hypertension, COPD, CKD stage IV, diabetes mellitus type 2, pulmonary embolus 10 years prior to this admission, OSA presenting with 1 day history of chest pain or shortness of breath.  The patient states that he has actually had shortness of breath for the past 6 months, but this has worsened in the past day or 2 prior to admission.  He describes substernal chest pain without any radiation.  He denies any syncope.  He denies any hemoptysis but he does have chronic cough with yellow-white sputum.  He denies any fevers, chills, headache, nausea, vomiting, direct abdominal pain.  The patient states that he quit smoking about 4 years prior to this admission after a 30-pack-year history.  He states that his lower extremity edema is about the same as usual.  He denies any worsening orthopnea.  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 In the emergency department, the patient was afebrile and hemodynamically stable with oxygen saturation 95% on 2 L.  WBC 4.4, hemoglobin 11.2, platelets 1 65,000.  BMP showed sodium 141, potassium 3.9, bicarbonate 32, serum creatinine 1.97.  Troponin was 8.  VQ scan showed a peripheral wedge-shaped perfusion defect in the LLL.  The patient was started on subcutaneous Lovenox.  Assessment and Plan: Acute pulmonary embolus -Unprovoked by clinical history -Patient had history of PE 10 years ago for which she took a year of anticoagulation -08/18/2022 VQ scan--positive for PE as discussed above -Started on [place] Lovenox>>transitioned to po apixaban -Echo EF 60-65%, no WMA,  G2DD, normal RVF, mildly enlarged RV   Acute respiratory failure with hypoxia -Secondary to PE and COPD exacerbation -Stable on 2 L nasal cannula -Presented with oxygen saturation 88% on room air with tachypnea -Weaned oxygen back to room air -08/20/22 ambulated hallway on RA>>no desaturation <94%   COPD exacerbation -Started IV Solu-Medrol>>d/c home with prednison 50 mg daily x 4 days -Started DuoNebs -Started Pulmicort   CKD 4 -Baseline creatinine 2.1-2.4 -serum creatinine 1.93 on day of dc   Controlled diabetes mellitus type 2 -Holding Trulicity>>restart after d/c -08/19/22 hemoglobin A1c--6.4 -NovoLog sliding scale   Chronic diastolic CHF -Continue home dose furosemide 40 mg twice daily -Patient has chronic lower extremity edema which she states is unchanged -Personally reviewed chest x-ray--no pulmonary edema or infiltrates -clinically euvolemic during hospitalizatino   Essential hypertension -Continue amlodipine and carvedilol -Holding ARB>>restart his home low dose valsartan after d/c  Dilated ascending aorta -incidental finding on echo 10/21 -measured 38 mm on echo -will need outpatient surveilance       Consultants: none Procedures performed: none  Disposition: Home Diet recommendation:  Carb modified diet DISCHARGE MEDICATION: Allergies as of 08/20/2022   No Known Allergies      Medication List     TAKE these medications    allopurinol 100 MG tablet Commonly known as: ZYLOPRIM Take 1 tablet (100 mg total) by mouth daily. What changed: when to take this   amLODipine 10 MG tablet Commonly known as: NORVASC Take 10 mg by mouth daily.   apixaban 5 MG Tabs tablet Commonly known  asArne [place] Take 2 tablets (10 mg total) by mouth 2 (two) times daily. On 08/27/22, start 5 mg  (1 tab) two times daily   atorvastatin 40 MG tablet Commonly known as: LIPITOR Take 1 tablet (40 mg total) by mouth daily. What changed: when to take this   carvedilol  12.5 MG tablet Commonly known as: COREG Take 12.5 mg by mouth 2 (two) times daily with a meal.   FeroSul 325 (65 FE) MG tablet Generic drug: ferrous sulfate TAKE ONE TABLET BY MOUTH EVERY MORNING WITH BREAKFAST   furosemide 40 MG tablet Commonly known as: LASIX Take 40 mg by mouth 2 (two) times daily.   ipratropium-albuterol 0.5-2.5 (3) MG/3ML Soln Commonly known as: DUONEB Take 3 mLs by nebulization every 6 (six) hours.   multivitamin with minerals Tabs tablet Take 1 tablet by mouth daily.   NYQUIL PO Take 1 Dose by mouth at bedtime as needed (cough).   OXYGEN Inhale 2 L into the lungs continuous.   pantoprazole 40 MG tablet Commonly known as: PROTONIX Take 1 tablet (40 mg total) by mouth 2 (two) times daily before a meal.   potassium chloride 10 MEQ tablet Commonly known as: KLOR-CON Take 10 mEq by mouth daily.   predniSONE 50 MG tablet Commonly known as: DELTASONE Take 1 tablet (50 mg total) by mouth daily with breakfast. Start taking on: August 21, 2022   Retacrit 3000 UNIT/ML injection Generic drug: epoetin alfa-epbx Inject 3,000 Units into the vein every 14 (fourteen) days.   Trulicity 2.54 YH/0.6CB Sopn Generic drug: Dulaglutide Inject 0.75 mg into the skin every Friday.   valsartan 40 MG tablet Commonly known as: DIOVAN Take 20 mg by mouth daily.        Discharge Exam: Filed Weights   08/18/22 1052 08/19/22 1300  Weight: 99.3 kg 95.8 kg   HEENT:  [place]/AT, No thrush, no icterus CV:  RRR, no rub, no S3, no S4 Lung:  bibasilar rales. No wheeze Abd:  soft/+BS, NT Ext:  No edema, no lymphangitis, no synovitis, no rash   Condition at discharge: stable  The results of significant diagnostics from this hospitalization (including imaging, microbiology, ancillary and laboratory) are listed below for reference.   Imaging Studies: US Venous Img Lower Bilateral (DVT)  Result Date: 08/18/2022 CLINICAL DATA:  Pulmonary emboli, bilateral lower  extremity edema EXAM: BILATERAL LOWER EXTREMITY VENOUS DOPPLER ULTRASOUND TECHNIQUE: Gray-scale sonography with graded compression, as well as color Doppler and duplex ultrasound were performed to evaluate the lower extremity deep venous systems from the level of the common femoral vein and including the common femoral, femoral, profunda femoral, popliteal and calf veins including the posterior tibial, peroneal and gastrocnemius veins when visible. The superficial great saphenous vein was also interrogated. Spectral Doppler was utilized to evaluate flow at rest and with distal augmentation maneuvers in the common femoral, femoral and popliteal veins. COMPARISON:  None Available. FINDINGS: RIGHT LOWER EXTREMITY Common Femoral Vein: No evidence of thrombus. Normal compressibility, respiratory phasicity and response to augmentation. Saphenofemoral Junction: No evidence of thrombus. Normal compressibility and flow on color Doppler imaging. Profunda Femoral Vein: No evidence of thrombus. Normal compressibility and flow on color Doppler imaging. Femoral Vein: No evidence of thrombus. Normal compressibility, respiratory phasicity and response to augmentation. Popliteal Vein: No evidence of thrombus. Normal compressibility, respiratory phasicity and response to augmentation. Calf Veins: No evidence of thrombus. Normal compressibility and flow on color Doppler imaging. Superficial Great Saphenous Vein: No evidence of thrombus. Normal compressibility. Venous Reflux:  None. Other Findings:  None. LEFT LOWER EXTREMITY Common Femoral Vein: No evidence of thrombus. Normal compressibility, respiratory phasicity and response to augmentation. Saphenofemoral Junction: No evidence of thrombus. Normal compressibility and flow on color Doppler imaging. Profunda Femoral Vein: No evidence of thrombus. Normal compressibility and flow on color Doppler imaging. Femoral Vein: No evidence of thrombus. Normal compressibility, respiratory  phasicity and response to augmentation. Popliteal Vein: No evidence of thrombus. Normal compressibility, respiratory phasicity and response to augmentation. Calf Veins: No evidence of thrombus. Normal compressibility and flow on color Doppler imaging. Superficial Great Saphenous Vein: No evidence of thrombus. Normal compressibility. Venous Reflux:  None. Other Findings:  None. IMPRESSION: No evidence of deep venous thrombosis in either lower extremity. Electronically Signed   By: Jacqulynn Cadet M.D.   On: 08/18/2022 16:53   NM Pulmonary Perfusion  Result Date: 08/18/2022 CLINICAL DATA:  Several day history of shortness of breath and chest pain EXAM: NUCLEAR MEDICINE PERFUSION LUNG SCAN TECHNIQUE: Perfusion images were obtained in multiple projections after intravenous injection of radiopharmaceutical. Ventilation scans intentionally deferred if perfusion scan and chest x-ray adequate for interpretation during COVID 19 epidemic. RADIOPHARMACEUTICALS:  4.4 mCi Tc-37mMAA IV COMPARISON:  Chest radiograph dated 08/18/2022 FINDINGS: Peripheral, wedge-shaped perfusion defect in the left lung in the region of the left lower lobe superior segment. IMPRESSION: Pulmonary embolism present. Electronically Signed   By: LDarrin NipperM.D.   On: 08/18/2022 16:47   DG Chest 2 View  Result Date: 08/18/2022 CLINICAL DATA:  76year old male with history of chest pain. Productive cough for the past 3 weeks. EXAM: CHEST - 2 VIEW COMPARISON:  Chest x-ray 10/20/2021. FINDINGS: Lung volumes are normal. Mild chronic elevation of the right hemidiaphragm is unchanged. No consolidative airspace disease. No pleural effusions. No pneumothorax. No pulmonary nodule or mass noted. Pulmonary vasculature and the cardiomediastinal silhouette are within normal limits. IMPRESSION: No radiographic evidence of acute cardiopulmonary disease. Electronically Signed   By: DVinnie LangtonM.D.   On: 08/18/2022 12:23    Microbiology: Results for  orders placed or performed during the hospital encounter of 08/18/22  Resp Panel by RT-PCR (Flu A&B, Covid) Anterior Nasal Swab     Status: None   Collection Time: 08/18/22 12:01 PM   Specimen: Anterior Nasal Swab  Result Value Ref Range Status   SARS Coronavirus 2 by RT PCR NEGATIVE NEGATIVE Final    Comment: (NOTE) SARS-CoV-2 target nucleic acids are NOT DETECTED.  The SARS-CoV-2 RNA is generally detectable in upper respiratory specimens during the acute phase of infection. The lowest concentration of SARS-CoV-2 viral copies this assay can detect is 138 copies/mL. A negative result does not preclude SARS-Cov-2 infection and should not be used as the sole basis for treatment or other patient management decisions. A negative result may occur with  improper specimen collection/handling, submission of specimen other than nasopharyngeal swab, presence of viral mutation(s) within the areas targeted by this assay, and inadequate number of viral copies(<138 copies/mL). A negative result must be combined with clinical observations, patient history, and epidemiological information. The expected result is Negative.  Fact Sheet for Patients:  hEntrepreneurPulse.com.au Fact Sheet for Healthcare Providers:  hIncredibleEmployment.be This test is no t yet approved or cleared by the [place] and  has been authorized for detection and/or diagnosis of SARS-CoV-2 by FDA under an Emergency Use Authorization (EUA). This EUA will remain  in effect (meaning this test can be used) for the duration of the COVID-19 declaration under Section  564(b)(1) of the Act, 21 U.S.C.section 360bbb-3(b)(1), unless the authorization is terminated  or revoked sooner.       Influenza A by PCR NEGATIVE NEGATIVE Final   Influenza B by PCR NEGATIVE NEGATIVE Final    Comment: (NOTE) The Xpert Xpress SARS-CoV-2/FLU/RSV plus assay is intended as an aid in the diagnosis of  influenza from Nasopharyngeal swab specimens and should not be used as a sole basis for treatment. Nasal washings and aspirates are unacceptable for Xpert Xpress SARS-CoV-2/FLU/RSV testing.  Fact Sheet for Patients: EntrepreneurPulse.com.au  Fact Sheet for Healthcare Providers: IncredibleEmployment.be  This test is not yet approved or cleared by the [place] FDA and has been authorized for detection and/or diagnosis of SARS-CoV-2 by FDA under an Emergency Use Authorization (EUA). This EUA will remain in effect (meaning this test can be used) for the duration of the COVID-19 declaration under Section 564(b)(1) of the Act, 21 U.S.C. section 360bbb-3(b)(1), unless the authorization is terminated or revoked.  Performed at [facility], [address], [place], [place] [address]     Labs: CBC: Recent Labs  Lab 08/18/22 1111 08/19/22 0333 08/20/22 0635  WBC 4.4 4.9 6.1  HGB 11.2* 10.7* 10.9*  HCT 37.4* 32.8* 33.0*  MCV 94.2 86.8 86.2  PLT 165 167 798   Basic Metabolic Panel: Recent Labs  Lab 08/18/22 1111 08/19/22 0333 08/20/22 0635  NA 138 141 140  K 4.0 3.9 4.2  CL 101 102 101  CO2 28 32 32  GLUCOSE 112* 91 154*  BUN 26* 22 26*  CREATININE 2.15* 1.97* 1.93*  CALCIUM 8.6* 8.6* 8.7*   Liver Function Tests: No results for input(s): "AST", "ALT", "ALKPHOS", "BILITOT", "PROT", "ALBUMIN" in the last 168 hours. CBG: Recent Labs  Lab 08/19/22 0735 08/19/22 1136 08/19/22 1636 08/20/22 0747  GLUCAP 80 114* 204* 147*    Discharge time spent: greater than 30 minutes.  Signed: Orson Eva, MD [facility] 08/20/2022

## 2022-08-20 NOTE — Progress Notes (Signed)
ANTICOAGULATION CONSULT NOTE - Initial Consult  Pharmacy Consult for apixaban dosing Indication: pulmonary embolus  No Known Allergies  Patient Measurements: Height: '5\' 9"'$  (175.3 cm) Weight: 95.8 kg (211 lb 3.2 oz) IBW/kg (Calculated) : 70.7   Vital Signs: Temp: 98.1 F (36.7 C) (10/21 0426) BP: 159/96 (10/21 0426) Pulse Rate: 96 (10/21 0426)  Labs: Recent Labs    08/18/22 1111 08/19/22 0333 08/20/22 0635  HGB 11.2* 10.7* 10.9*  HCT 37.4* 32.8* 33.0*  PLT 165 167 179  CREATININE 2.15* 1.97* 1.93*    Estimated Creatinine Clearance: 36 mL/min (A) (by C-G formula based on SCr of 1.93 mg/dL (H)). Lab Results  Component Value Date   CREATININE 1.93 (H) 08/20/2022   CREATININE 1.97 (H) 08/19/2022   CREATININE 2.15 (H) 08/18/2022   Filed Weights   08/18/22 1052 08/19/22 1300  Weight: 99.3 kg (219 lb) 95.8 kg (211 lb 3.2 oz)     Medical History: Past Medical History:  Diagnosis Date   Anemia    Borderline diabetic    Chronic kidney disease    Diabetes mellitus without complication ([place])    Duodenal adenoma    2021   GERD (gastroesophageal reflux disease)    Gout    Gouty arthritis    H/O small bowel obstruction    History of back surgery    Hx of adenomatous colonic polyps    2021   Hyperlipidemia    Hypertension    PE (pulmonary embolism)    Sleep apnea     Medications:  Medications Prior to Admission  Medication Sig Dispense Refill Last Dose   allopurinol (ZYLOPRIM) 100 MG tablet Take 1 tablet (100 mg total) by mouth daily. (Patient taking differently: Take 100 mg by mouth at bedtime.) 90 tablet 1 08/18/2022   amLODipine (NORVASC) 10 MG tablet Take 10 mg by mouth daily.   08/18/2022   atorvastatin (LIPITOR) 40 MG tablet Take 1 tablet (40 mg total) by mouth daily. (Patient taking differently: Take 40 mg by mouth at bedtime.) 90 tablet 1 08/17/2022   carvedilol (COREG) 12.5 MG tablet Take 12.5 mg by mouth 2 (two) times daily with a meal.   08/18/2022 at  am   FEROSUL 325 (65 Fe) MG tablet TAKE ONE TABLET BY MOUTH EVERY MORNING WITH BREAKFAST 90 tablet 3 08/18/2022   furosemide (LASIX) 40 MG tablet Take 40 mg by mouth 2 (two) times daily.   08/18/2022   Multiple Vitamin (MULTIVITAMIN WITH MINERALS) TABS tablet Take 1 tablet by mouth daily.   08/18/2022   pantoprazole (PROTONIX) 40 MG tablet Take 1 tablet (40 mg total) by mouth 2 (two) times daily before a meal. 60 tablet 12 08/18/2022   Pseudoeph-Doxylamine-DM-APAP (NYQUIL PO) Take 1 Dose by mouth at bedtime as needed (cough).   79/89/2119   TRULICITY 4.17 EY/8.1KG SOPN Inject 0.75 mg into the skin every Friday.   Past Week   valsartan (DIOVAN) 40 MG tablet Take 20 mg by mouth daily.   08/18/2022   epoetin alfa-epbx (RETACRIT) 3000 UNIT/ML injection Inject 3,000 Units into the vein every 14 (fourteen) days.       OXYGEN Inhale 2 L into the lungs continuous.      potassium chloride (KLOR-CON) 10 MEQ tablet Take 10 mEq by mouth daily.      Scheduled:   allopurinol  100 mg Oral QHS   amLODipine  10 mg Oral Daily   apixaban  10 mg Oral BID   Followed by   Derrill Memo ON 08/27/2022]  apixaban  5 mg Oral BID   atorvastatin  40 mg Oral QHS   budesonide (PULMICORT) nebulizer solution  0.5 mg Nebulization BID   carvedilol  12.5 mg Oral BID WC   ferrous sulfate  325 mg Oral Q breakfast   fluticasone  2 spray Each Nare Daily   furosemide  40 mg Oral BID   insulin aspart  0-5 Units Subcutaneous QHS   insulin aspart  0-9 Units Subcutaneous TID WC   ipratropium-albuterol  3 mL Nebulization Q6H   methylPREDNISolone (SOLU-MEDROL) injection  40 mg Intravenous Q12H   pantoprazole  40 mg Oral BID AC   Infusions:  PRN: acetaminophen **OR** acetaminophen, ondansetron **OR** ondansetron (ZOFRAN) IV, polyethylene glycol Anti-infectives (From admission, onward)    None       Assessment: Andrew Adams a 76 y.o. male requires anticoagulation with apixaban for the indication of  pulmonary embolus. Apixban  will be started following pharmacy protocol per pharmacy consult.   Goal of Therapy:  Patient to be properly anticoagulated with apixaban Monitor platelets by anticoagulation protocol: yes   Plan:   Stop enoxaparin  Start apixaban at time of next due enoxaparin dose  Apixaban '10mg'$  po BID x 7 days, followed by apixaban '5mg'$  po BID, duration of therapy to be determined by following physician  CBC MWF Monitor for signs and symptoms of bleeding   Thomasenia Sales, PharmD Clinical Pharmacist 08/20/2022,9:44 AM

## 2022-08-20 NOTE — Progress Notes (Signed)
*  PRELIMINARY RESULTS* Echocardiogram 2D Echocardiogram has been performed.  Andrew Adams 08/20/2022, 10:10 AM

## 2022-08-20 NOTE — Progress Notes (Signed)
ANTICOAGULATION CONSULT NOTE - follow up [facility] for lovenox Indication: pulmonary embolus  No Known Allergies  Patient Measurements: Height: '5\' 9"'$  (175.3 cm) Weight: 95.8 kg (211 lb 3.2 oz) IBW/kg (Calculated) : 70.7 Heparin Dosing Weight:   Vital Signs: Temp: 98.1 F (36.7 C) (10/21 0426) BP: 159/96 (10/21 0426) Pulse Rate: 96 (10/21 0426)  Labs: Recent Labs    08/18/22 1111 08/18/22 1256 08/19/22 0333 08/20/22 0635  HGB 11.2*  --  10.7* 10.9*  HCT 37.4*  --  32.8* 33.0*  PLT 165  --  167 179  CREATININE 2.15*  --  1.97* 1.93*  TROPONINIHS 8 8  --   --      Estimated Creatinine Clearance: 36 mL/min (A) (by C-G formula based on SCr of 1.93 mg/dL (H)).   Medical History: Past Medical History:  Diagnosis Date   Anemia    Borderline diabetic    Chronic kidney disease    Diabetes mellitus without complication ([place])    Duodenal adenoma    2021   GERD (gastroesophageal reflux disease)    Gout    Gouty arthritis    H/O small bowel obstruction    History of back surgery    Hx of adenomatous colonic polyps    2021   Hyperlipidemia    Hypertension    PE (pulmonary embolism)    Sleep apnea     Medications:  Medications Prior to Admission  Medication Sig Dispense Refill Last Dose   allopurinol (ZYLOPRIM) 100 MG tablet Take 1 tablet (100 mg total) by mouth daily. (Patient taking differently: Take 100 mg by mouth at bedtime.) 90 tablet 1 08/18/2022   amLODipine (NORVASC) 10 MG tablet Take 10 mg by mouth daily.   08/18/2022   atorvastatin (LIPITOR) 40 MG tablet Take 1 tablet (40 mg total) by mouth daily. (Patient taking differently: Take 40 mg by mouth at bedtime.) 90 tablet 1 08/17/2022   carvedilol (COREG) 12.5 MG tablet Take 12.5 mg by mouth 2 (two) times daily with a meal.   08/18/2022 at am   FEROSUL 325 (65 Fe) MG tablet TAKE ONE TABLET BY MOUTH EVERY MORNING WITH BREAKFAST 90 tablet 3 08/18/2022   furosemide (LASIX) 40 MG tablet Take 40 mg  by mouth 2 (two) times daily.   08/18/2022   Multiple Vitamin (MULTIVITAMIN WITH MINERALS) TABS tablet Take 1 tablet by mouth daily.   08/18/2022   pantoprazole (PROTONIX) 40 MG tablet Take 1 tablet (40 mg total) by mouth 2 (two) times daily before a meal. 60 tablet 12 08/18/2022   Pseudoeph-Doxylamine-DM-APAP (NYQUIL PO) Take 1 Dose by mouth at bedtime as needed (cough).   62/13/0865   TRULICITY 7.84 ON/6.2XB SOPN Inject 0.75 mg into the skin every Friday.   Past Week   valsartan (DIOVAN) 40 MG tablet Take 20 mg by mouth daily.   08/18/2022   epoetin alfa-epbx (RETACRIT) 3000 UNIT/ML injection Inject 3,000 Units into the vein every 14 (fourteen) days.       OXYGEN Inhale 2 L into the lungs continuous.      potassium chloride (KLOR-CON) 10 MEQ tablet Take 10 mEq by mouth daily.       Assessment: Pharmacy consulted to dose lovenox in patient with pulmonary embolism.    CBC WNL  Goal of Therapy:   Monitor platelets by anticoagulation protocol: Yes   Plan:  Lovenox 100 mg subq every 12 hours. Monitor H&H and renal function (borderline dose adjustment needed)  Thomasenia Sales, PharmD, MBA  Clinical Pharmacist  08/20/2022 8:43 AM

## 2022-08-20 NOTE — Progress Notes (Signed)
  Transition of Care [facility]) Screening Note   Patient Details  Name: Andrew Adams Date of Birth: September 06, 1946   Transition of [facility]) CM/SW Contact:    Kimber Relic, LCSW Phone Number: 08/20/2022, 2:03 PM    Transition of Care Department [facility]) has reviewed patient and no TOC needs have been identified at this time. We will continue to monitor patient advancement through interdisciplinary progression rounds. If new patient transition needs arise, please place a TOC consult.

## 2022-08-20 NOTE — Progress Notes (Signed)
Nsg Discharge Note  Admit Date:  08/18/2022 Discharge date: 08/20/2022   Andrew Adams to be D/C'd Home per MD order.  AVS completed.  Copy for chart, and copy for patient signed, and dated. Patient/caregiver able to verbalize understanding.  Discharge Medication: Allergies as of 08/20/2022   No Known Allergies      Medication List     TAKE these medications    allopurinol 100 MG tablet Commonly known as: ZYLOPRIM Take 1 tablet (100 mg total) by mouth daily. What changed: when to take this   amLODipine 10 MG tablet Commonly known as: NORVASC Take 10 mg by mouth daily.   apixaban 5 MG Tabs tablet Commonly known as: ELIQUIS Take 2 tablets (10 mg total) by mouth 2 (two) times daily. On 08/27/22, start 5 mg  (1 tab) two times daily   atorvastatin 40 MG tablet Commonly known as: LIPITOR Take 1 tablet (40 mg total) by mouth daily. What changed: when to take this   carvedilol 12.5 MG tablet Commonly known as: COREG Take 12.5 mg by mouth 2 (two) times daily with a meal.   FeroSul 325 (65 FE) MG tablet Generic drug: ferrous sulfate TAKE ONE TABLET BY MOUTH EVERY MORNING WITH BREAKFAST   furosemide 40 MG tablet Commonly known as: LASIX Take 40 mg by mouth 2 (two) times daily.   ipratropium-albuterol 0.5-2.5 (3) MG/3ML Soln Commonly known as: DUONEB Take 3 mLs by nebulization every 6 (six) hours.   multivitamin with minerals Tabs tablet Take 1 tablet by mouth daily.   NYQUIL PO Take 1 Dose by mouth at bedtime as needed (cough).   OXYGEN Inhale 2 L into the lungs continuous.   pantoprazole 40 MG tablet Commonly known as: PROTONIX Take 1 tablet (40 mg total) by mouth 2 (two) times daily before a meal.   potassium chloride 10 MEQ tablet Commonly known as: KLOR-CON Take 10 mEq by mouth daily.   predniSONE 50 MG tablet Commonly known as: DELTASONE Take 1 tablet (50 mg total) by mouth daily with breakfast. Start taking on: August 21, 2022   Retacrit 3000  UNIT/ML injection Generic drug: epoetin alfa-epbx Inject 3,000 Units into the vein every 14 (fourteen) days.   Trulicity 1.47 WG/9.5AO Sopn Generic drug: Dulaglutide Inject 0.75 mg into the skin every Friday.   valsartan 40 MG tablet Commonly known as: DIOVAN Take 20 mg by mouth daily.        Discharge Assessment: Vitals:   08/20/22 1314 08/20/22 1340  BP:    Pulse:    Resp:    Temp:    SpO2: (!) 88% 94%   Skin clean, dry and intact without evidence of skin break down, no evidence of skin tears noted. IV catheter discontinued intact. Site without signs and symptoms of complications - no redness or edema noted at insertion site, patient denies c/o pain - only slight tenderness at site.  Dressing with slight pressure applied.  D/c Instructions-Education: Discharge instructions given to patient/family with verbalized understanding. D/c education completed with patient/family including follow up instructions, medication list, d/c activities limitations if indicated, with other d/c instructions as indicated by MD - patient able to verbalize understanding, all questions fully answered. Patient instructed to return to ED, call 911, or call MD for any changes in condition.  Patient escorted via [place], and D/C home via private auto.  Clovis Fredrickson, LPN 13/05/6577 4:69 PM

## 2022-08-25 LAB — GLUCOSE, CAPILLARY: Glucose-Capillary: 152 mg/dL — ABNORMAL HIGH (ref 70–99)

## 2022-09-02 ENCOUNTER — Other Ambulatory Visit: Payer: Self-pay

## 2022-09-05 ENCOUNTER — Other Ambulatory Visit: Payer: Self-pay

## 2022-09-06 ENCOUNTER — Encounter (HOSPITAL_COMMUNITY): Admission: RE | Admit: 2022-09-06 | Payer: Medicare HMO | Source: Ambulatory Visit

## 2022-09-07 ENCOUNTER — Encounter (HOSPITAL_COMMUNITY)
Admission: RE | Admit: 2022-09-07 | Discharge: 2022-09-07 | Disposition: A | Discharge status: Home / Self Care | Payer: Medicare HMO | Source: Ambulatory Visit | Attending: Nephrology | Admitting: Nephrology

## 2022-09-07 VITALS — BP 152/92 | HR 85 | Temp 98.0°F | Resp 16

## 2022-09-07 DIAGNOSIS — Z862 Personal history of diseases of the blood and blood-forming organs and certain disorders involving the immune mechanism: Secondary | ICD-10-CM | POA: Diagnosis not present

## 2022-09-07 DIAGNOSIS — N189 Chronic kidney disease, unspecified: Secondary | ICD-10-CM | POA: Insufficient documentation

## 2022-09-07 MED ORDER — EPOETIN ALFA-EPBX 3000 UNIT/ML IJ SOLN: 3000.0000 [IU] | Freq: Once | INTRAMUSCULAR | Status: DC

## 2022-09-07 NOTE — Addendum Note (Signed)
Encounter addended by: Ramond Craver, [facility] on: 09/07/2022 4:07 PM  Actions taken: Order list changed

## 2022-09-07 NOTE — Progress Notes (Signed)
Diagnosis: Anemia in Chronic Kidney Disease  Provider:  Manpreet Bhutani MD  Procedure: Injection  No injection required. Hemoglobin 10.8.  Post Care:  No injection administered  Discharge: Condition: Good, Destination: Home . AVS provided to patient.   Performed by:  Binnie Kand, RN

## 2022-09-08 LAB — POCT HEMOGLOBIN-HEMACUE: Hemoglobin: 10.8 g/dL — ABNORMAL LOW (ref 13.0–17.0)

## 2022-09-19 DIAGNOSIS — Z6832 Body mass index (BMI) 32.0-32.9, adult: Secondary | ICD-10-CM | POA: Diagnosis not present

## 2022-09-19 DIAGNOSIS — I5032 Chronic diastolic (congestive) heart failure: Secondary | ICD-10-CM | POA: Diagnosis not present

## 2022-09-19 DIAGNOSIS — E1122 Type 2 diabetes mellitus with diabetic chronic kidney disease: Secondary | ICD-10-CM | POA: Diagnosis not present

## 2022-09-19 DIAGNOSIS — N1832 Chronic kidney disease, stage 3b: Secondary | ICD-10-CM | POA: Diagnosis not present

## 2022-09-19 DIAGNOSIS — N189 Chronic kidney disease, unspecified: Secondary | ICD-10-CM | POA: Diagnosis not present

## 2022-09-20 DIAGNOSIS — J449 Chronic obstructive pulmonary disease, unspecified: Secondary | ICD-10-CM | POA: Diagnosis not present

## 2022-09-20 DIAGNOSIS — I509 Heart failure, unspecified: Secondary | ICD-10-CM | POA: Diagnosis not present

## 2022-09-29 DIAGNOSIS — K219 Gastro-esophageal reflux disease without esophagitis: Secondary | ICD-10-CM | POA: Diagnosis not present

## 2022-09-29 DIAGNOSIS — I10 Essential (primary) hypertension: Secondary | ICD-10-CM | POA: Diagnosis not present

## 2022-09-29 DIAGNOSIS — E782 Mixed hyperlipidemia: Secondary | ICD-10-CM | POA: Diagnosis not present

## 2022-09-29 DIAGNOSIS — J449 Chronic obstructive pulmonary disease, unspecified: Secondary | ICD-10-CM | POA: Diagnosis not present

## 2022-10-05 ENCOUNTER — Encounter (HOSPITAL_COMMUNITY)
Admission: RE | Admit: 2022-10-05 | Discharge: 2022-10-05 | Disposition: A | Discharge status: Home / Self Care | Payer: Medicare HMO | Source: Ambulatory Visit | Attending: Nephrology | Admitting: Nephrology

## 2022-10-05 VITALS — BP 171/94 | HR 75 | Temp 97.4°F | Resp 16

## 2022-10-05 DIAGNOSIS — I5032 Chronic diastolic (congestive) heart failure: Secondary | ICD-10-CM | POA: Insufficient documentation

## 2022-10-05 DIAGNOSIS — R809 Proteinuria, unspecified: Secondary | ICD-10-CM | POA: Diagnosis not present

## 2022-10-05 DIAGNOSIS — I129 Hypertensive chronic kidney disease with stage 1 through stage 4 chronic kidney disease, or unspecified chronic kidney disease: Secondary | ICD-10-CM | POA: Insufficient documentation

## 2022-10-05 DIAGNOSIS — E1129 Type 2 diabetes mellitus with other diabetic kidney complication: Secondary | ICD-10-CM | POA: Insufficient documentation

## 2022-10-05 DIAGNOSIS — I4510 Unspecified right bundle-branch block: Secondary | ICD-10-CM | POA: Diagnosis not present

## 2022-10-05 DIAGNOSIS — Z862 Personal history of diseases of the blood and blood-forming organs and certain disorders involving the immune mechanism: Secondary | ICD-10-CM | POA: Insufficient documentation

## 2022-10-05 DIAGNOSIS — E1122 Type 2 diabetes mellitus with diabetic chronic kidney disease: Secondary | ICD-10-CM | POA: Diagnosis not present

## 2022-10-05 DIAGNOSIS — R000 Tachycardia, unspecified: Secondary | ICD-10-CM | POA: Diagnosis not present

## 2022-10-05 DIAGNOSIS — E211 Secondary hyperparathyroidism, not elsewhere classified: Secondary | ICD-10-CM | POA: Diagnosis not present

## 2022-10-05 DIAGNOSIS — N189 Chronic kidney disease, unspecified: Secondary | ICD-10-CM

## 2022-10-05 DIAGNOSIS — N184 Chronic kidney disease, stage 4 (severe): Secondary | ICD-10-CM | POA: Insufficient documentation

## 2022-10-05 LAB — RENAL FUNCTION PANEL
Albumin: 3.9 g/dL (ref 3.5–5.0)
Anion gap: 11 (ref 5–15)
BUN: 31 mg/dL — ABNORMAL HIGH (ref 8–23)
CO2: 27 mmol/L (ref 22–32)
Calcium: 7.9 mg/dL — ABNORMAL LOW (ref 8.9–10.3)
Chloride: 98 mmol/L (ref 98–111)
Creatinine, Ser: 2.16 mg/dL — ABNORMAL HIGH (ref 0.61–1.24)
GFR, Estimated: 31 mL/min — ABNORMAL LOW (ref >60)
Glucose, Bld: 89 mg/dL (ref 70–99)
Phosphorus: 4 mg/dL (ref 2.5–4.6)
Potassium: 3.2 mmol/L — ABNORMAL LOW (ref 3.5–5.1)
Sodium: 136 mmol/L (ref 135–145)

## 2022-10-05 LAB — IRON AND TIBC
Iron: 47 ug/dL (ref 45–182)
Saturation Ratios: 14 % — ABNORMAL LOW (ref 17.9–39.5)
TIBC: 338 ug/dL (ref 250–450)
UIBC: 291 ug/dL

## 2022-10-05 LAB — FERRITIN: Ferritin: 58 ng/mL (ref 24–336)

## 2022-10-05 LAB — PROTEIN / CREATININE RATIO, URINE
Creatinine, Urine: 50.89 mg/dL
Protein Creatinine Ratio: 0.9 mg/mg{Cre} — ABNORMAL HIGH (ref 0.00–0.15)
Total Protein, Urine: 46 mg/dL

## 2022-10-05 MED ORDER — EPOETIN ALFA-EPBX 3000 UNIT/ML IJ SOLN: 3000.0000 [IU] | Freq: Once | INTRAMUSCULAR | Status: DC

## 2022-10-05 NOTE — Addendum Note (Signed)
Encounter addended by: Cristy Friedlander, [facility] on: 10/05/2022 4:15 PM  Actions taken: Order list changed

## 2022-10-05 NOTE — Progress Notes (Signed)
Diagnosis: Anemia in Chronic Kidney Disease  Provider:  Manpreet Bhutani MD  Hgb. 10.9   no injection needed  Post Care: Observation period completed  Discharge: Condition: Good, Destination: Home . AVS provided to patient.   Performed by:  Oren Beckmann, RN

## 2022-10-06 LAB — VITAMIN D 25 HYDROXY (VIT D DEFICIENCY, FRACTURES): Vit D, 25-Hydroxy: 63.09 ng/mL (ref 30–100)

## 2022-10-06 LAB — POCT HEMOGLOBIN-HEMACUE: Hemoglobin: 10.9 g/dL — ABNORMAL LOW (ref 13.0–17.0)

## 2022-10-07 DIAGNOSIS — E1129 Type 2 diabetes mellitus with other diabetic kidney complication: Secondary | ICD-10-CM | POA: Diagnosis not present

## 2022-10-07 DIAGNOSIS — E1122 Type 2 diabetes mellitus with diabetic chronic kidney disease: Secondary | ICD-10-CM | POA: Diagnosis not present

## 2022-10-07 DIAGNOSIS — E211 Secondary hyperparathyroidism, not elsewhere classified: Secondary | ICD-10-CM | POA: Diagnosis not present

## 2022-10-07 DIAGNOSIS — I129 Hypertensive chronic kidney disease with stage 1 through stage 4 chronic kidney disease, or unspecified chronic kidney disease: Secondary | ICD-10-CM | POA: Diagnosis not present

## 2022-10-07 DIAGNOSIS — Z862 Personal history of diseases of the blood and blood-forming organs and certain disorders involving the immune mechanism: Secondary | ICD-10-CM | POA: Diagnosis not present

## 2022-10-07 DIAGNOSIS — R000 Tachycardia, unspecified: Secondary | ICD-10-CM | POA: Diagnosis not present

## 2022-10-07 DIAGNOSIS — I4510 Unspecified right bundle-branch block: Secondary | ICD-10-CM | POA: Diagnosis not present

## 2022-10-07 DIAGNOSIS — I5032 Chronic diastolic (congestive) heart failure: Secondary | ICD-10-CM | POA: Diagnosis not present

## 2022-10-07 DIAGNOSIS — N184 Chronic kidney disease, stage 4 (severe): Secondary | ICD-10-CM | POA: Diagnosis not present

## 2022-10-07 DIAGNOSIS — R809 Proteinuria, unspecified: Secondary | ICD-10-CM | POA: Diagnosis not present

## 2022-10-07 LAB — PTH, INTACT AND CALCIUM
Calcium, Total (PTH): 8 mg/dL — ABNORMAL LOW (ref 8.6–10.2)
PTH: 119 pg/mL — ABNORMAL HIGH (ref 15–65)

## 2022-10-17 DIAGNOSIS — E119 Type 2 diabetes mellitus without complications: Secondary | ICD-10-CM | POA: Diagnosis not present

## 2022-10-20 ENCOUNTER — Other Ambulatory Visit: Payer: Self-pay

## 2022-10-20 ENCOUNTER — Encounter (HOSPITAL_COMMUNITY): Payer: Self-pay | Admitting: *Deleted

## 2022-10-20 ENCOUNTER — Inpatient Hospital Stay (HOSPITAL_COMMUNITY)
Admission: EM | Admit: 2022-10-20 | Discharge: 2022-10-30 | DRG: 190 | Disposition: A | Discharge status: Home / Self Care | Payer: Medicare HMO | Attending: Internal Medicine | Admitting: Internal Medicine

## 2022-10-20 ENCOUNTER — Emergency Department (HOSPITAL_COMMUNITY): Payer: Medicare HMO

## 2022-10-20 DIAGNOSIS — E785 Hyperlipidemia, unspecified: Secondary | ICD-10-CM | POA: Diagnosis present

## 2022-10-20 DIAGNOSIS — T380X5A Adverse effect of glucocorticoids and synthetic analogues, initial encounter: Secondary | ICD-10-CM | POA: Diagnosis not present

## 2022-10-20 DIAGNOSIS — D6859 Other primary thrombophilia: Secondary | ICD-10-CM | POA: Diagnosis present

## 2022-10-20 DIAGNOSIS — R5381 Other malaise: Secondary | ICD-10-CM | POA: Diagnosis present

## 2022-10-20 DIAGNOSIS — I5189 Other ill-defined heart diseases: Secondary | ICD-10-CM | POA: Diagnosis not present

## 2022-10-20 DIAGNOSIS — J9811 Atelectasis: Secondary | ICD-10-CM | POA: Diagnosis not present

## 2022-10-20 DIAGNOSIS — N184 Chronic kidney disease, stage 4 (severe): Secondary | ICD-10-CM | POA: Diagnosis not present

## 2022-10-20 DIAGNOSIS — Z7901 Long term (current) use of anticoagulants: Secondary | ICD-10-CM

## 2022-10-20 DIAGNOSIS — I130 Hypertensive heart and chronic kidney disease with heart failure and stage 1 through stage 4 chronic kidney disease, or unspecified chronic kidney disease: Secondary | ICD-10-CM | POA: Diagnosis present

## 2022-10-20 DIAGNOSIS — Z7985 Long-term (current) use of injectable non-insulin antidiabetic drugs: Secondary | ICD-10-CM

## 2022-10-20 DIAGNOSIS — D631 Anemia in chronic kidney disease: Secondary | ICD-10-CM | POA: Diagnosis not present

## 2022-10-20 DIAGNOSIS — E119 Type 2 diabetes mellitus without complications: Secondary | ICD-10-CM

## 2022-10-20 DIAGNOSIS — Z86711 Personal history of pulmonary embolism: Secondary | ICD-10-CM

## 2022-10-20 DIAGNOSIS — N179 Acute kidney failure, unspecified: Secondary | ICD-10-CM | POA: Diagnosis present

## 2022-10-20 DIAGNOSIS — K219 Gastro-esophageal reflux disease without esophagitis: Secondary | ICD-10-CM | POA: Diagnosis not present

## 2022-10-20 DIAGNOSIS — Z1152 Encounter for screening for COVID-19: Secondary | ICD-10-CM

## 2022-10-20 DIAGNOSIS — J9601 Acute respiratory failure with hypoxia: Secondary | ICD-10-CM | POA: Diagnosis not present

## 2022-10-20 DIAGNOSIS — R531 Weakness: Secondary | ICD-10-CM

## 2022-10-20 DIAGNOSIS — Z79899 Other long term (current) drug therapy: Secondary | ICD-10-CM

## 2022-10-20 DIAGNOSIS — I5033 Acute on chronic diastolic (congestive) heart failure: Secondary | ICD-10-CM | POA: Diagnosis not present

## 2022-10-20 DIAGNOSIS — I509 Heart failure, unspecified: Secondary | ICD-10-CM | POA: Diagnosis not present

## 2022-10-20 DIAGNOSIS — J90 Pleural effusion, not elsewhere classified: Secondary | ICD-10-CM | POA: Diagnosis not present

## 2022-10-20 DIAGNOSIS — E1122 Type 2 diabetes mellitus with diabetic chronic kidney disease: Secondary | ICD-10-CM | POA: Diagnosis not present

## 2022-10-20 DIAGNOSIS — I4719 Other supraventricular tachycardia: Secondary | ICD-10-CM | POA: Diagnosis not present

## 2022-10-20 DIAGNOSIS — G4733 Obstructive sleep apnea (adult) (pediatric): Secondary | ICD-10-CM | POA: Diagnosis present

## 2022-10-20 DIAGNOSIS — I471 Supraventricular tachycardia, unspecified: Secondary | ICD-10-CM

## 2022-10-20 DIAGNOSIS — Z8249 Family history of ischemic heart disease and other diseases of the circulatory system: Secondary | ICD-10-CM | POA: Diagnosis not present

## 2022-10-20 DIAGNOSIS — Z833 Family history of diabetes mellitus: Secondary | ICD-10-CM | POA: Diagnosis not present

## 2022-10-20 DIAGNOSIS — Z86010 Personal history of colonic polyps: Secondary | ICD-10-CM | POA: Diagnosis not present

## 2022-10-20 DIAGNOSIS — J441 Chronic obstructive pulmonary disease with (acute) exacerbation: Principal | ICD-10-CM | POA: Diagnosis present

## 2022-10-20 DIAGNOSIS — Z515 Encounter for palliative care: Secondary | ICD-10-CM

## 2022-10-20 DIAGNOSIS — J449 Chronic obstructive pulmonary disease, unspecified: Secondary | ICD-10-CM | POA: Diagnosis not present

## 2022-10-20 DIAGNOSIS — Y92239 Unspecified place in hospital as the place of occurrence of the external cause: Secondary | ICD-10-CM | POA: Diagnosis not present

## 2022-10-20 DIAGNOSIS — Z87891 Personal history of nicotine dependence: Secondary | ICD-10-CM

## 2022-10-20 DIAGNOSIS — I10 Essential (primary) hypertension: Secondary | ICD-10-CM | POA: Diagnosis not present

## 2022-10-20 DIAGNOSIS — J9602 Acute respiratory failure with hypercapnia: Secondary | ICD-10-CM | POA: Diagnosis present

## 2022-10-20 DIAGNOSIS — Z961 Presence of intraocular lens: Secondary | ICD-10-CM | POA: Diagnosis present

## 2022-10-20 DIAGNOSIS — E875 Hyperkalemia: Secondary | ICD-10-CM | POA: Diagnosis not present

## 2022-10-20 DIAGNOSIS — I5032 Chronic diastolic (congestive) heart failure: Secondary | ICD-10-CM | POA: Diagnosis present

## 2022-10-20 DIAGNOSIS — R0609 Other forms of dyspnea: Secondary | ICD-10-CM | POA: Diagnosis present

## 2022-10-20 DIAGNOSIS — J9690 Respiratory failure, unspecified, unspecified whether with hypoxia or hypercapnia: Secondary | ICD-10-CM | POA: Diagnosis not present

## 2022-10-20 DIAGNOSIS — R0902 Hypoxemia: Secondary | ICD-10-CM | POA: Diagnosis not present

## 2022-10-20 DIAGNOSIS — I77810 Thoracic aortic ectasia: Secondary | ICD-10-CM | POA: Diagnosis not present

## 2022-10-20 DIAGNOSIS — N189 Chronic kidney disease, unspecified: Secondary | ICD-10-CM | POA: Diagnosis present

## 2022-10-20 DIAGNOSIS — Z862 Personal history of diseases of the blood and blood-forming organs and certain disorders involving the immune mechanism: Secondary | ICD-10-CM | POA: Diagnosis present

## 2022-10-20 DIAGNOSIS — Z9842 Cataract extraction status, left eye: Secondary | ICD-10-CM

## 2022-10-20 DIAGNOSIS — Z9841 Cataract extraction status, right eye: Secondary | ICD-10-CM

## 2022-10-20 DIAGNOSIS — R0602 Shortness of breath: Secondary | ICD-10-CM | POA: Diagnosis not present

## 2022-10-20 LAB — COMPREHENSIVE METABOLIC PANEL
ALT: 12 U/L (ref 0–44)
AST: 19 U/L (ref 15–41)
Albumin: 3.5 g/dL (ref 3.5–5.0)
Alkaline Phosphatase: 53 U/L (ref 38–126)
Anion gap: 9 (ref 5–15)
BUN: 20 mg/dL (ref 8–23)
CO2: 31 mmol/L (ref 22–32)
Calcium: 8.3 mg/dL — ABNORMAL LOW (ref 8.9–10.3)
Chloride: 98 mmol/L (ref 98–111)
Creatinine, Ser: 2.29 mg/dL — ABNORMAL HIGH (ref 0.61–1.24)
GFR, Estimated: 29 mL/min — ABNORMAL LOW (ref >60)
Glucose, Bld: 100 mg/dL — ABNORMAL HIGH (ref 70–99)
Potassium: 3.8 mmol/L (ref 3.5–5.1)
Sodium: 138 mmol/L (ref 135–145)
Total Bilirubin: 0.7 mg/dL (ref 0.3–1.2)
Total Protein: 6.4 g/dL — ABNORMAL LOW (ref 6.5–8.1)

## 2022-10-20 LAB — CBC WITH DIFFERENTIAL/PLATELET
Abs Immature Granulocytes: 0.01 10*3/uL (ref 0.00–0.07)
Basophils Absolute: 0 10*3/uL (ref 0.0–0.1)
Basophils Relative: 1 %
Eosinophils Absolute: 0.2 10*3/uL (ref 0.0–0.5)
Eosinophils Relative: 5 %
HCT: 30.8 % — ABNORMAL LOW (ref 39.0–52.0)
Hemoglobin: 9.8 g/dL — ABNORMAL LOW (ref 13.0–17.0)
Immature Granulocytes: 0 %
Lymphocytes Relative: 29 %
Lymphs Abs: 1.2 10*3/uL (ref 0.7–4.0)
MCH: 27.9 pg (ref 26.0–34.0)
MCHC: 31.8 g/dL (ref 30.0–36.0)
MCV: 87.7 fL (ref 80.0–100.0)
Monocytes Absolute: 0.5 10*3/uL (ref 0.1–1.0)
Monocytes Relative: 12 %
Neutro Abs: 2.3 10*3/uL (ref 1.7–7.7)
Neutrophils Relative %: 53 %
Platelets: 158 10*3/uL (ref 150–400)
RBC: 3.51 MIL/uL — ABNORMAL LOW (ref 4.22–5.81)
RDW: 13.7 % (ref 11.5–15.5)
WBC: 4.2 10*3/uL (ref 4.0–10.5)
nRBC: 0 % (ref 0.0–0.2)

## 2022-10-20 LAB — BRAIN NATRIURETIC PEPTIDE: B Natriuretic Peptide: 252 pg/mL — ABNORMAL HIGH (ref 0.0–100.0)

## 2022-10-20 LAB — RESP PANEL BY RT-PCR (RSV, FLU A&B, COVID)  RVPGX2
Influenza A by PCR: NEGATIVE
Influenza B by PCR: NEGATIVE
Resp Syncytial Virus by PCR: NEGATIVE
SARS Coronavirus 2 by RT PCR: NEGATIVE

## 2022-10-20 LAB — TROPONIN I (HIGH SENSITIVITY)
Troponin I (High Sensitivity): 8 ng/L (ref <18)
Troponin I (High Sensitivity): 7 ng/L (ref <18)

## 2022-10-20 LAB — LIPASE, BLOOD: Lipase: 34 U/L (ref 11–51)

## 2022-10-20 LAB — CBG MONITORING, ED: Glucose-Capillary: 141 mg/dL — ABNORMAL HIGH (ref 70–99)

## 2022-10-20 MED ORDER — METHYLPREDNISOLONE SODIUM SUCC 40 MG IJ SOLR
40.0000 mg | Freq: Two times a day (BID) | INTRAMUSCULAR | Status: DC
  Administered 2022-10-21 – 2022-10-22 (×3): 40 mg via INTRAVENOUS
  Filled 2022-10-20 (×3): qty 1

## 2022-10-20 MED ORDER — ADULT MULTIVITAMIN W/MINERALS CH
1.0000 | ORAL_TABLET | Freq: Every day | ORAL | Status: DC
  Administered 2022-10-21 – 2022-10-30 (×9): 1 via ORAL
  Filled 2022-10-20 (×10): qty 1

## 2022-10-20 MED ORDER — HYDRALAZINE HCL 20 MG/ML IJ SOLN
10.0000 mg | INTRAMUSCULAR | Status: DC | PRN
  Administered 2022-10-24 – 2022-10-25 (×3): 10 mg via INTRAVENOUS
  Filled 2022-10-20 (×3): qty 1

## 2022-10-20 MED ORDER — PANTOPRAZOLE SODIUM 40 MG PO TBEC
40.0000 mg | DELAYED_RELEASE_TABLET | Freq: Two times a day (BID) | ORAL | Status: DC
  Administered 2022-10-20 – 2022-10-30 (×20): 40 mg via ORAL
  Filled 2022-10-20 (×20): qty 1

## 2022-10-20 MED ORDER — ALLOPURINOL 100 MG PO TABS
100.0000 mg | ORAL_TABLET | Freq: Every day | ORAL | Status: DC
  Administered 2022-10-20 – 2022-10-29 (×10): 100 mg via ORAL
  Filled 2022-10-20 (×10): qty 1

## 2022-10-20 MED ORDER — GUAIFENESIN ER 600 MG PO TB12
1200.0000 mg | ORAL_TABLET | Freq: Two times a day (BID) | ORAL | Status: AC
  Administered 2022-10-20 – 2022-10-23 (×8): 1200 mg via ORAL
  Filled 2022-10-20 (×8): qty 2

## 2022-10-20 MED ORDER — IRBESARTAN 75 MG PO TABS
75.0000 mg | ORAL_TABLET | Freq: Every day | ORAL | Status: DC
  Administered 2022-10-20 – 2022-10-26 (×7): 75 mg via ORAL
  Filled 2022-10-20 (×7): qty 1

## 2022-10-20 MED ORDER — ACETAMINOPHEN 650 MG RE SUPP: 650.0000 mg | Freq: Four times a day (QID) | RECTAL | Status: DC | PRN

## 2022-10-20 MED ORDER — FUROSEMIDE 10 MG/ML IJ SOLN
60.0000 mg | Freq: Two times a day (BID) | INTRAMUSCULAR | Status: DC
  Administered 2022-10-20 – 2022-10-21 (×2): 60 mg via INTRAVENOUS
  Filled 2022-10-20 (×2): qty 6

## 2022-10-20 MED ORDER — FUROSEMIDE 10 MG/ML IJ SOLN
40.0000 mg | Freq: Once | INTRAMUSCULAR | Status: AC
  Administered 2022-10-20: 40 mg via INTRAVENOUS
  Filled 2022-10-20: qty 4

## 2022-10-20 MED ORDER — MAGNESIUM SULFATE 2 GM/50ML IV SOLN
2.0000 g | Freq: Once | INTRAVENOUS | Status: AC
  Administered 2022-10-20: 2 g via INTRAVENOUS
  Filled 2022-10-20: qty 50

## 2022-10-20 MED ORDER — ATORVASTATIN CALCIUM 40 MG PO TABS
40.0000 mg | ORAL_TABLET | Freq: Every day | ORAL | Status: DC
  Administered 2022-10-20 – 2022-10-29 (×10): 40 mg via ORAL
  Filled 2022-10-20 (×10): qty 1

## 2022-10-20 MED ORDER — FUROSEMIDE 10 MG/ML IJ SOLN: 40.0000 mg | Freq: Two times a day (BID) | INTRAMUSCULAR | Status: DC

## 2022-10-20 MED ORDER — DOXYCYCLINE HYCLATE 100 MG PO TABS
100.0000 mg | ORAL_TABLET | Freq: Two times a day (BID) | ORAL | Status: AC
  Administered 2022-10-20 – 2022-10-24 (×10): 100 mg via ORAL
  Filled 2022-10-20 (×10): qty 1

## 2022-10-20 MED ORDER — BISACODYL 5 MG PO TBEC
5.0000 mg | DELAYED_RELEASE_TABLET | Freq: Every day | ORAL | Status: DC | PRN
  Administered 2022-10-25: 5 mg via ORAL
  Filled 2022-10-20: qty 1

## 2022-10-20 MED ORDER — CARVEDILOL 12.5 MG PO TABS
12.5000 mg | ORAL_TABLET | Freq: Two times a day (BID) | ORAL | Status: DC
  Administered 2022-10-20 – 2022-10-26 (×12): 12.5 mg via ORAL
  Filled 2022-10-20 (×13): qty 1

## 2022-10-20 MED ORDER — METHYLPREDNISOLONE SODIUM SUCC 125 MG IJ SOLR
125.0000 mg | Freq: Once | INTRAMUSCULAR | Status: AC
  Administered 2022-10-20: 125 mg via INTRAVENOUS
  Filled 2022-10-20: qty 2

## 2022-10-20 MED ORDER — OXYCODONE HCL 5 MG PO TABS
5.0000 mg | ORAL_TABLET | Freq: Four times a day (QID) | ORAL | Status: DC | PRN
  Administered 2022-10-24 – 2022-10-25 (×2): 5 mg via ORAL
  Filled 2022-10-20 (×2): qty 1

## 2022-10-20 MED ORDER — APIXABAN 5 MG PO TABS
5.0000 mg | ORAL_TABLET | Freq: Two times a day (BID) | ORAL | Status: DC
  Administered 2022-10-20 – 2022-10-30 (×20): 5 mg via ORAL
  Filled 2022-10-20 (×20): qty 1

## 2022-10-20 MED ORDER — ACETAMINOPHEN 325 MG PO TABS: 650.0000 mg | ORAL_TABLET | Freq: Four times a day (QID) | ORAL | Status: DC | PRN

## 2022-10-20 MED ORDER — TRAZODONE HCL 50 MG PO TABS
25.0000 mg | ORAL_TABLET | Freq: Every evening | ORAL | Status: DC | PRN
  Administered 2022-10-20 – 2022-10-25 (×3): 25 mg via ORAL
  Filled 2022-10-20 (×3): qty 1

## 2022-10-20 MED ORDER — POTASSIUM CHLORIDE CRYS ER 10 MEQ PO TBCR
10.0000 meq | EXTENDED_RELEASE_TABLET | Freq: Every day | ORAL | Status: DC
  Administered 2022-10-20 – 2022-10-21 (×2): 10 meq via ORAL
  Filled 2022-10-20 (×2): qty 1

## 2022-10-20 MED ORDER — INSULIN ASPART 100 UNIT/ML IJ SOLN
0.0000 [IU] | Freq: Three times a day (TID) | INTRAMUSCULAR | Status: DC
  Administered 2022-10-20: 1 [IU] via SUBCUTANEOUS
  Administered 2022-10-21 – 2022-10-22 (×3): 2 [IU] via SUBCUTANEOUS
  Administered 2022-10-22: 1 [IU] via SUBCUTANEOUS
  Administered 2022-10-22: 3 [IU] via SUBCUTANEOUS
  Administered 2022-10-23 – 2022-10-24 (×6): 1 [IU] via SUBCUTANEOUS
  Administered 2022-10-25 (×2): 2 [IU] via SUBCUTANEOUS
  Administered 2022-10-26: 1 [IU] via SUBCUTANEOUS
  Administered 2022-10-26 – 2022-10-27 (×5): 2 [IU] via SUBCUTANEOUS
  Administered 2022-10-28: 5 [IU] via SUBCUTANEOUS
  Administered 2022-10-28: 2 [IU] via SUBCUTANEOUS
  Administered 2022-10-28 – 2022-10-29 (×2): 3 [IU] via SUBCUTANEOUS
  Administered 2022-10-29: 5 [IU] via SUBCUTANEOUS
  Administered 2022-10-29: 1 [IU] via SUBCUTANEOUS
  Filled 2022-10-20: qty 1

## 2022-10-20 MED ORDER — ONDANSETRON HCL 4 MG/2ML IJ SOLN: 4.0000 mg | Freq: Four times a day (QID) | INTRAMUSCULAR | Status: DC | PRN

## 2022-10-20 MED ORDER — ONDANSETRON HCL 4 MG PO TABS: 4.0000 mg | ORAL_TABLET | Freq: Four times a day (QID) | ORAL | Status: DC | PRN

## 2022-10-20 MED ORDER — FENTANYL CITRATE PF 50 MCG/ML IJ SOSY
12.5000 ug | PREFILLED_SYRINGE | INTRAMUSCULAR | Status: DC | PRN
  Administered 2022-10-25: 12.5 ug via INTRAVENOUS
  Filled 2022-10-20: qty 1

## 2022-10-20 MED ORDER — AMLODIPINE BESYLATE 5 MG PO TABS
10.0000 mg | ORAL_TABLET | Freq: Every day | ORAL | Status: DC
  Administered 2022-10-20: 10 mg via ORAL
  Filled 2022-10-20: qty 2

## 2022-10-20 MED ORDER — FERROUS SULFATE 325 (65 FE) MG PO TABS
325.0000 mg | ORAL_TABLET | Freq: Every day | ORAL | Status: DC
  Administered 2022-10-21 – 2022-10-30 (×10): 325 mg via ORAL
  Filled 2022-10-20 (×10): qty 1

## 2022-10-20 MED ORDER — IPRATROPIUM-ALBUTEROL 0.5-2.5 (3) MG/3ML IN SOLN: 3.0000 mL | RESPIRATORY_TRACT | Status: DC | PRN

## 2022-10-20 MED ORDER — IPRATROPIUM-ALBUTEROL 0.5-2.5 (3) MG/3ML IN SOLN
3.0000 mL | RESPIRATORY_TRACT | Status: AC
  Administered 2022-10-20 (×2): 3 mL via RESPIRATORY_TRACT
  Filled 2022-10-20 (×2): qty 3

## 2022-10-20 NOTE — ED Provider Notes (Signed)
[facility] EMERGENCY DEPARTMENT Provider Note   CSN: [id number] Arrival date & time: 10/20/22  8032     History Chief Complaint  Patient presents with   Shortness of Breath    HPI Andrew Adams is a 76 y.o. male presenting for shortness of breath.  He states that over the last 3 days he has been dyspneic with progressive nature. He states that over the last 3 weeks he has had worsening leg swelling and abdominal swelling.  States he has an extensive medical history but cannotPer chart review he has COPD, hypertension, PE, CHF and a history of hypoxic respiratory failure.  On arrival in triage he was hypoxic.  Patient's recorded medical, surgical, social, medication list and allergies were reviewed in the Snapshot window as part of the initial history.   Review of Systems   Review of Systems  Constitutional:  Positive for fatigue. Negative for chills and fever.  HENT:  Negative for ear pain and sore throat.   Eyes:  Negative for pain and visual disturbance.  Respiratory:  Positive for cough and shortness of breath.   Cardiovascular:  Negative for chest pain and palpitations.  Gastrointestinal:  Negative for abdominal pain and vomiting.  Genitourinary:  Negative for dysuria and hematuria.  Musculoskeletal:  Negative for arthralgias and back pain.  Skin:  Negative for color change and rash.  Neurological:  Negative for seizures and syncope.  All other systems reviewed and are negative.   Physical Exam Updated Vital Signs BP (!) 153/98   Pulse 79   Temp 97.8 F (36.6 C) (Oral)   Resp (!) 25   Ht '5\' 9"'$  (1.753 m)   Wt 97.5 kg   SpO2 94%   BMI 31.75 kg/m  Physical Exam Vitals and nursing note reviewed.  Constitutional:      General: He is not in acute distress.    Appearance: He is well-developed.  HENT:     Head: Normocephalic and atraumatic.  Eyes:     Conjunctiva/sclera: Conjunctivae normal.  Cardiovascular:     Rate and Rhythm: Normal rate and regular rhythm.      Heart sounds: No murmur heard. Pulmonary:     Effort: Pulmonary effort is normal. Tachypnea present. No respiratory distress.     Breath sounds: Rales present.  Abdominal:     Palpations: Abdomen is soft.     Tenderness: There is no abdominal tenderness.  Musculoskeletal:        General: No swelling.     Cervical back: Neck supple.  Skin:    General: Skin is warm and dry.     Capillary Refill: Capillary refill takes less than 2 seconds.  Neurological:     Mental Status: He is alert.  Psychiatric:        Mood and Affect: Mood normal.      ED Course/ Medical Decision Making/ A&P    Procedures Procedures   Medications Ordered in ED Medications  ipratropium-albuterol (DUONEB) 0.5-2.5 (3) MG/3ML nebulizer solution 3 mL (3 mLs Nebulization Given 10/20/22 1332)  insulin aspart (novoLOG) injection 0-9 Units (has no administration in time range)  doxycycline (VIBRA-TABS) tablet 100 mg (100 mg Oral Given 10/20/22 1331)  methylPREDNISolone sodium succinate (SOLU-MEDROL) 40 mg/mL injection 40 mg (has no administration in time range)  acetaminophen (TYLENOL) tablet 650 mg (has no administration in time range)    Or  acetaminophen (TYLENOL) suppository 650 mg (has no administration in time range)  oxyCODONE (Oxy IR/ROXICODONE) immediate release tablet 5 mg (has  no administration in time range)  fentaNYL (SUBLIMAZE) injection 12.5 mcg (has no administration in time range)  traZODone (DESYREL) tablet 25 mg (has no administration in time range)  bisacodyl (DULCOLAX) EC tablet 5 mg (has no administration in time range)  ondansetron (ZOFRAN) tablet 4 mg (has no administration in time range)    Or  ondansetron (ZOFRAN) injection 4 mg (has no administration in time range)  guaiFENesin (MUCINEX) 12 hr tablet 1,200 mg (1,200 mg Oral Given 10/20/22 1331)  hydrALAZINE (APRESOLINE) injection 10 mg (has no administration in time range)  furosemide (LASIX) injection 40 mg (has no administration  in time range)  allopurinol (ZYLOPRIM) tablet 100 mg (has no administration in time range)  amLODipine (NORVASC) tablet 10 mg (10 mg Oral Given 10/20/22 1506)  atorvastatin (LIPITOR) tablet 40 mg (has no administration in time range)  carvedilol (COREG) tablet 12.5 mg (has no administration in time range)  ferrous sulfate tablet 325 mg (has no administration in time range)  multivitamin with minerals tablet 1 tablet (has no administration in time range)  pantoprazole (PROTONIX) EC tablet 40 mg (has no administration in time range)  potassium chloride (KLOR-CON M) CR tablet 10 mEq (10 mEq Oral Given 10/20/22 1506)  irbesartan (AVAPRO) tablet 75 mg (75 mg Oral Given 10/20/22 1506)  apixaban (ELIQUIS) tablet 5 mg (5 mg Oral Given 10/20/22 1506)  methylPREDNISolone sodium succinate (SOLU-MEDROL) 125 mg/2 mL injection 125 mg (125 mg Intravenous Given 10/20/22 1203)  magnesium sulfate IVPB 2 g 50 mL (0 g Intravenous Stopped 10/20/22 1310)  furosemide (LASIX) injection 40 mg (40 mg Intravenous Given 10/20/22 1205)    Medical Decision Making:    Andrew Adams is a 76 y.o. male who presented to the ED today with shortness of breath detailed above.     Patient's presentation is complicated by their history of advanced age, multiple comorbid medical conditions, outpatient medication management.  Patient placed on continuous vitals and telemetry monitoring while in ED which was reviewed periodically.   Complete initial physical exam performed, notably the patient  was tachypneic requiring titration onto oxygen for hypoxia.  Stabilized on 2 L nasal cannula.  Nadir oxygen saturation 80% on room air.   Reviewed and confirmed nursing documentation for past medical history, family history, social history.    Initial Assessment:   With the patient's presentation of shortness of breath, most likely diagnosis is heart failure exacerbation versus COPD exacerbation. Other diagnoses were considered including  (but not limited to) recurrent pulmonary embolism, anemia, pneumonia, pneumothorax. These are considered less likely due to history of present illness and physical exam findings.   This is most consistent with an acute life/limb threatening illness complicated by underlying chronic conditions.  Initial Plan:  Viral screening test for common etiologies of patient's developing shortness of breath Screening labs including CBC and Metabolic panel to evaluate for infectious or metabolic etiology of disease.  Urinalysis with reflex culture ordered to evaluate for UTI or relevant urologic/nephrologic pathology.  CXR to evaluate for structural/infectious intrathoracic pathology.  BNP/troponin/EKG to evaluate for cardiac pathology. Objective evaluation as below reviewed with plan for close reassessment  Initial Study Results:   Laboratory  All laboratory results reviewed without evidence of clinically relevant pathology.   Exceptions include: BNP elevation   EKG EKG was reviewed independently. Rate, rhythm, axis, intervals all examined and without medically relevant abnormality. ST segments without concerns for elevations.    Radiology  All images reviewed independently. Agree with radiology report at this time.  DG Chest Portable 1 View  Result Date: 10/20/2022 CLINICAL DATA:  Short of breath. EXAM: PORTABLE CHEST 1 VIEW COMPARISON:  Chest 08/18/2022 FINDINGS: Mild cardiac enlargement.  Negative for heart failure. Elevated right hemidiaphragm with mild right lower lobe atelectasis. Left lung clear. No effusion IMPRESSION: Elevated right hemidiaphragm with mild right lower lobe atelectasis. Electronically Signed   By: Franchot Gallo M.D.   On: 10/20/2022 09:33       Final Assessment and Plan:   Patient's history of present illness and physical exam findings are most consistent with heart failure exacerbation with comorbid COPD exacerbation.  He is hypoxic requiring oxygen.  Attempted treatment  and send the emergency department without complete resolution.  He will require further care and management.  He has been compliant with his Eliquis and therefore PE is considered less likely.   Disposition:   Based on the above findings, I believe this patient is stable for admission.    Patient/family educated about specific findings on our evaluation and explained exact reasons for admission.  Patient/family educated about clinical situation and time was allowed to answer questions.   Admission team communicated with and agreed with need for admission. Patient admitted. Patient ready to move at this time.     Emergency Department Medication Summary:   Medications  ipratropium-albuterol (DUONEB) 0.5-2.5 (3) MG/3ML nebulizer solution 3 mL (3 mLs Nebulization Given 10/20/22 1332)  insulin aspart (novoLOG) injection 0-9 Units (has no administration in time range)  doxycycline (VIBRA-TABS) tablet 100 mg (100 mg Oral Given 10/20/22 1331)  methylPREDNISolone sodium succinate (SOLU-MEDROL) 40 mg/mL injection 40 mg (has no administration in time range)  acetaminophen (TYLENOL) tablet 650 mg (has no administration in time range)    Or  acetaminophen (TYLENOL) suppository 650 mg (has no administration in time range)  oxyCODONE (Oxy IR/ROXICODONE) immediate release tablet 5 mg (has no administration in time range)  fentaNYL (SUBLIMAZE) injection 12.5 mcg (has no administration in time range)  traZODone (DESYREL) tablet 25 mg (has no administration in time range)  bisacodyl (DULCOLAX) EC tablet 5 mg (has no administration in time range)  ondansetron (ZOFRAN) tablet 4 mg (has no administration in time range)    Or  ondansetron (ZOFRAN) injection 4 mg (has no administration in time range)  guaiFENesin (MUCINEX) 12 hr tablet 1,200 mg (1,200 mg Oral Given 10/20/22 1331)  hydrALAZINE (APRESOLINE) injection 10 mg (has no administration in time range)  furosemide (LASIX) injection 40 mg (has no  administration in time range)  allopurinol (ZYLOPRIM) tablet 100 mg (has no administration in time range)  amLODipine (NORVASC) tablet 10 mg (10 mg Oral Given 10/20/22 1506)  atorvastatin (LIPITOR) tablet 40 mg (has no administration in time range)  carvedilol (COREG) tablet 12.5 mg (has no administration in time range)  ferrous sulfate tablet 325 mg (has no administration in time range)  multivitamin with minerals tablet 1 tablet (has no administration in time range)  pantoprazole (PROTONIX) EC tablet 40 mg (has no administration in time range)  potassium chloride (KLOR-CON M) CR tablet 10 mEq (10 mEq Oral Given 10/20/22 1506)  irbesartan (AVAPRO) tablet 75 mg (75 mg Oral Given 10/20/22 1506)  apixaban (ELIQUIS) tablet 5 mg (5 mg Oral Given 10/20/22 1506)  methylPREDNISolone sodium succinate (SOLU-MEDROL) 125 mg/2 mL injection 125 mg (125 mg Intravenous Given 10/20/22 1203)  magnesium sulfate IVPB 2 g 50 mL (0 g Intravenous Stopped 10/20/22 1310)  furosemide (LASIX) injection 40 mg (40 mg Intravenous Given 10/20/22 1205)  Clinical Impression:  1. Hypoxia   2. Shortness of breath      Admit   Final Clinical Impression(s) / ED Diagnoses Final diagnoses:  Hypoxia  Shortness of breath    Rx / DC Orders ED Discharge Orders     None         Tretha Sciara, MD 10/20/22 1511

## 2022-10-20 NOTE — Progress Notes (Signed)
Patient declined CPAP. States he doesn't wear one at home and doesn't want one here. No unit in room at this time.

## 2022-10-20 NOTE — H&P (Signed)
History and Physical  [place] PXT:[id number] DOB: Apr 09, 1946 DOA: 10/20/2022  PCP: Lavella Lemons, PA  Patient coming from: Home  Level of care: Med-Surg  I have personally briefly reviewed patient's old medical records in [place]  Chief Complaint: SOB and swollen legs  HPI: Andrew Adams is a 76 year old gentleman with stage IV CKD, hypertension, type 2 diabetes mellitus, pulmonary embolus on apixaban, OSA, diastolic heart failure presented to the emergency department complaining of progressive shortness of breath and increasing edema in bilateral lower extremities over the past week.  He has been coughing and wheezing frequently increasing in sputum production and malaise.  He has been using his home bronchodilators but does not seem to be able to control the symptoms.  He denies fever and chills.  He denies nausea vomiting and diarrhea.  He was noted in the ED to have 3+ pitting edema bilateral lower extremities and diffuse wheezing.  He was started on IV Lasix and treatment for acute COPD exacerbation.  Patient reports no chest pain symptoms.  Patient reports he may not have been taking his Lasix regularly in the last several weeks.  He is being admitted for further management.   Past Medical History:  Diagnosis Date   Anemia    Borderline diabetic    Chronic kidney disease    Diabetes mellitus without complication ([place])    Duodenal adenoma    2021   GERD (gastroesophageal reflux disease)    Gout    Gouty arthritis    H/O small bowel obstruction    History of back surgery    Hx of adenomatous colonic polyps    2021   Hyperlipidemia    Hypertension    PE (pulmonary embolism)    Sleep apnea     Past Surgical History:  Procedure Laterality Date   ABDOMINAL SURGERY  2103   Smal bowel obstruction    ABDOMINAL SURGERY  1981   Perforated large intestine   back     Lower back   BIOPSY  08/28/2020   Procedure: BIOPSY;  Surgeon: Harvel Quale, MD;  [facility];  Service: Gastroenterology;;   BIOPSY  02/07/2022   Procedure: BIOPSY;  Surgeon: Irving Copas., MD;  Location: [facility];  Service: Gastroenterology;;   CATARACT EXTRACTION W/PHACO Right 01/06/2020   Procedure: CATARACT EXTRACTION PHACO AND INTRAOCULAR LENS PLACEMENT (IOC) (CDE: 6.48);  Surgeon: [name], MD;  Location: [facility];  Service: Ophthalmology;  Laterality: Right;   CATARACT EXTRACTION W/PHACO Left 01/20/2020   Procedure: CATARACT EXTRACTION PHACO AND INTRAOCULAR LENS PLACEMENT (IOC);  Surgeon: [name], MD;  Location: [facility];  Service: Ophthalmology;  Laterality: Left;  CDE: 5.86   COLONOSCOPY WITH PROPOFOL N/A 08/28/2020   Procedure: COLONOSCOPY WITH PROPOFOL;  Surgeon: Harvel Quale, MD;  [facility];  Service: Gastroenterology;  Laterality: N/A;  230   ENDOSCOPIC MUCOSAL RESECTION N/A 02/07/2022   Procedure: ENDOSCOPIC MUCOSAL RESECTION;  Surgeon: Rush Landmark Telford Nab., MD;  Location: [facility];  Service: Gastroenterology;  Laterality: N/A;   ESOPHAGOGASTRODUODENOSCOPY N/A 02/07/2022   Procedure: ESOPHAGOGASTRODUODENOSCOPY (EGD);  Surgeon: Irving Copas., MD;  Location: [facility];  Service: Gastroenterology;  Laterality: N/A;   ESOPHAGOGASTRODUODENOSCOPY (EGD) WITH PROPOFOL N/A 08/28/2020   Procedure: ESOPHAGOGASTRODUODENOSCOPY (EGD) WITH PROPOFOL;  Surgeon: Harvel Quale, MD;  [facility];  Service: Gastroenterology;  Laterality: N/A;   ESOPHAGOGASTRODUODENOSCOPY (EGD) WITH PROPOFOL N/A 12/14/2020   Procedure: ESOPHAGOGASTRODUODENOSCOPY (EGD)  WITH PROPOFOL;  Surgeon: Mansouraty, Telford Nab., MD;  Location: [facility];  Service: Gastroenterology;  Laterality: N/A;   GIVENS CAPSULE STUDY N/A 09/10/2020   Procedure: GIVENS CAPSULE STUDY;  Surgeon: Harvel Quale, MD;  [facility];  Service: Gastroenterology;  Laterality: N/A;  [place]  12/14/2020   Procedure: HEMOSTASIS CLIP PLACEMENT;  Surgeon: Irving Copas., MD;  Location: [facility];  Service: Gastroenterology;;   HEMOSTASIS CLIP PLACEMENT  02/07/2022   Procedure: HEMOSTASIS CLIP PLACEMENT;  Surgeon: Irving Copas., MD;  Location: [facility];  Service: Gastroenterology;;   HEMOSTASIS CONTROL  12/14/2020   Procedure: HEMOSTASIS CONTROL;  Surgeon: Irving Copas., MD;  [facility];  Service: Gastroenterology;;   HERNIA REPAIR     incisional   HOT HEMOSTASIS N/A 02/07/2022   Procedure: HOT HEMOSTASIS (ARGON PLASMA COAGULATION/BICAP);  Surgeon: Irving Copas., MD;  Location: [facility];  Service: Gastroenterology;  Laterality: N/A;   POLYPECTOMY  08/28/2020   Procedure: POLYPECTOMY;  Surgeon: Harvel Quale, MD;  [facility];  Service: Gastroenterology;;   POLYPECTOMY  12/14/2020   Procedure: POLYPECTOMY;  Surgeon: Irving Copas., MD;  Location: [facility];  Service: Gastroenterology;;   Clide Deutscher  12/14/2020   Procedure: Clide Deutscher;  Surgeon: Mansouraty, Telford Nab., MD;  Location: [facility];  Service: Gastroenterology;;   Maryagnes Amos INJECTION  02/07/2022   Procedure: SUBMUCOSAL LIFTING INJECTION;  Surgeon: Irving Copas., MD;  Location: [facility];  Service: Gastroenterology;;   SUBMUCOSAL TATTOO INJECTION  12/14/2020   Procedure: SUBMUCOSAL TATTOO INJECTION;  Surgeon: Irving Copas., MD;  [facility];  Service: Gastroenterology;;     reports that he quit smoking about 27 years ago. His smoking use included cigarettes. He has a 38.00 pack-year smoking history. He has never used smokeless tobacco. He reports current alcohol use of about 2.0 standard drinks of alcohol per week. He reports current drug use. Drug: Marijuana.  No Known Allergies  Family History  Problem Relation Age of Onset   Hypertension Sister     Diabetes Sister    Diabetes Sister    Hypertension Mother    Diabetes Mother    Hypertension Father    Ulcers Father    Colon cancer Neg Hx    Esophageal cancer Neg Hx    Stomach cancer Neg Hx    Inflammatory bowel disease Neg Hx    Liver disease Neg Hx    Pancreatic cancer Neg Hx    Rectal cancer Neg Hx     Prior to Admission medications   Medication Sig Start Date End Date Taking? Authorizing Provider  allopurinol (ZYLOPRIM) 100 MG tablet Take 1 tablet (100 mg total) by mouth daily. Patient taking differently: Take 100 mg by mouth at bedtime. 02/24/14  Yes Martin, Mary-Margaret, FNP  amLODipine (NORVASC) 10 MG tablet Take 10 mg by mouth daily. 12/20/19  Yes [provider]  apixaban (ELIQUIS) 5 MG TABS tablet Take 2 tablets (10 mg total) by mouth 2 (two) times daily. On 08/27/22, start 5 mg  (1 tab) two times daily 08/20/22  Yes Tat, David, MD  atorvastatin (LIPITOR) 40 MG tablet Take 1 tablet (40 mg total) by mouth daily. Patient taking differently: Take 40 mg by mouth at bedtime. 02/24/14  Yes Martin, Mary-Margaret, FNP  carvedilol (COREG) 12.5 MG tablet Take 12.5 mg by mouth 2 (two) times daily with a meal.   Yes [provider]  epoetin alfa-epbx (RETACRIT) 3000 UNIT/ML  injection Inject 3,000 Units into the vein every 14 (fourteen) days.    Yes [provider]  FEROSUL 325 (65 Fe) MG tablet TAKE ONE TABLET BY MOUTH EVERY MORNING WITH BREAKFAST Patient taking differently: Take 325 mg by mouth daily with breakfast. 12/20/21  Yes Carlan, Chelsea L, NP  ipratropium-albuterol (DUONEB) 0.5-2.5 (3) MG/3ML SOLN Take 3 mLs by nebulization every 6 (six) hours. Patient taking differently: Take 3 mLs by nebulization every 4 (four) hours as needed. 08/20/22  Yes Tat, Shanon Brow, MD  Multiple Vitamin (MULTIVITAMIN WITH MINERALS) TABS tablet Take 1 tablet by mouth daily. 02/16/20  Yes Rendon Howell L, MD  pantoprazole (PROTONIX) 40 MG tablet Take 1 tablet (40 mg total) by  mouth 2 (two) times daily before a meal. 02/07/22  Yes Mansouraty, Telford Nab., MD  potassium chloride (KLOR-CON) 10 MEQ tablet Take 10 mEq by mouth daily. 07/18/22  Yes [provider]  TRULICITY 8.31 DV/7.6HY SOPN Inject 0.75 mg into the skin every Friday.   Yes [provider]  valsartan (DIOVAN) 40 MG tablet Take 20 mg by mouth daily. 10/11/21  Yes [provider]  furosemide (LASIX) 40 MG tablet Take 40 mg by mouth 2 (two) times daily. Patient not taking: Reported on 10/20/2022    [provider]    Physical Exam: Vitals:   10/20/22 1000 10/20/22 1100 10/20/22 1130 10/20/22 1200  BP: 123/81 (!) 143/92 124/83 (!) 144/81  Pulse: 66 95 69 69  Resp: 20 (!) '31 18 18  '$ Temp:      TempSrc:      SpO2: 96% 96% 97% 95%  Weight:      Height:        Constitutional: frail elderly male, NAD, calm, comfortable, alert, oriented, able to make basic needs known.  Eyes: PERRL, lids and conjunctivae normal ENMT: Mucous membranes are moist. Posterior pharynx clear of any exudate or lesions Neck: normal, supple, no masses, no thyromegaly Respiratory: diffuse wheezing heard bilaterally.  Cardiovascular: normal s1, s2 sounds, no murmurs / rubs / gallops. 3+ pitting lower extremity edema. 2+ pedal pulses. No carotid bruits.  Abdomen: no tenderness, no masses palpated. No hepatosplenomegaly. Bowel sounds positive.  Musculoskeletal: no clubbing / cyanosis. No joint deformity upper and lower extremities. Good ROM, no contractures. Normal muscle tone.  Skin: no rashes, lesions, ulcers. No induration Neurologic: CN 2-12 grossly intact. Sensation intact, DTR normal. Strength 5/5 in all 4.  Psychiatric: poor judgment and insight. Alert and oriented x 3. Normal mood.   Labs on Admission: I have personally reviewed following labs and imaging studies  CBC: Recent Labs  Lab 10/20/22 0901  WBC 4.2  NEUTROABS 2.3  HGB 9.8*  HCT 30.8*  MCV 87.7  PLT 073   Basic Metabolic  Panel: Recent Labs  Lab 10/20/22 0901  NA 138  K 3.8  CL 98  CO2 31  GLUCOSE 100*  BUN 20  CREATININE 2.29*  CALCIUM 8.3*   GFR: Estimated Creatinine Clearance: 30.6 mL/min (A) (by C-G formula based on SCr of 2.29 mg/dL (H)). Liver Function Tests: Recent Labs  Lab 10/20/22 0901  AST 19  ALT 12  ALKPHOS 53  BILITOT 0.7  PROT 6.4*  ALBUMIN 3.5   Recent Labs  Lab 10/20/22 0901  LIPASE 34   No results for input(s): "AMMONIA" in the last 168 hours. Coagulation Profile: No results for input(s): "INR", "PROTIME" in the last 168 hours. Cardiac Enzymes: No results for input(s): "CKTOTAL", "CKMB", "CKMBINDEX", "TROPONINI" in the last 168  hours. BNP (last 3 results) No results for input(s): "PROBNP" in the last 8760 hours. HbA1C: No results for input(s): "HGBA1C" in the last 72 hours. CBG: No results for input(s): "GLUCAP" in the last 168 hours. Lipid Profile: No results for input(s): "CHOL", "HDL", "LDLCALC", "TRIG", "CHOLHDL", "LDLDIRECT" in the last 72 hours. Thyroid Function Tests: No results for input(s): "TSH", "T4TOTAL", "FREET4", "T3FREE", "THYROIDAB" in the last 72 hours. Anemia Panel: No results for input(s): "VITAMINB12", "FOLATE", "FERRITIN", "TIBC", "IRON", "RETICCTPCT" in the last 72 hours. Urine analysis:    Component Value Date/Time   COLORURINE YELLOW 08/18/2022 1116   APPEARANCEUR CLEAR 08/18/2022 1116   LABSPEC 1.009 08/18/2022 1116   PHURINE 6.0 08/18/2022 1116   GLUCOSEU NEGATIVE 08/18/2022 1116   [place] 08/18/2022 1116   [place] 08/18/2022 1116   BILIRUBINUR negative 01/20/2014 1032   KETONESUR NEGATIVE 08/18/2022 1116   PROTEINUR 100 (A) 08/18/2022 1116   UROBILINOGEN negative 01/20/2014 1032   NITRITE NEGATIVE 08/18/2022 1116   LEUKOCYTESUR TRACE (A) 08/18/2022 1116   Radiological Exams on Admission: DG Chest Portable 1 View  Result Date: 10/20/2022 CLINICAL DATA:  Short of breath. EXAM: PORTABLE CHEST 1 VIEW  COMPARISON:  Chest 08/18/2022 FINDINGS: Mild cardiac enlargement.  Negative for heart failure. Elevated right hemidiaphragm with mild right lower lobe atelectasis. Left lung clear. No effusion IMPRESSION: Elevated right hemidiaphragm with mild right lower lobe atelectasis. Electronically Signed   By: Franchot Gallo M.D.   On: 10/20/2022 09:33    EKG: Independently reviewed.  Assessment/Plan Principal Problem:   COPD with acute exacerbation (HCC) Active Problems:   Essential hypertension   Hyperlipidemia   GERD (gastroesophageal reflux disease)   Acute respiratory failure with hypoxia (HCC)   Generalized weakness   DOE (dyspnea on exertion)   Moderate obstructive sleep apnea   COPD (chronic obstructive pulmonary disease) (HCC)   CKD (chronic kidney disease), stage IV (HCC)   Anemia due to chronic kidney disease   Controlled type 2 diabetes mellitus without complication, without long-term current use of insulin (HCC)   Chronic diastolic CHF (congestive heart failure) (HCC)   COPD with acute exacerbation  - continue scheduled bronchodilators - doxycycline 100 mg BID - mucinex 1200 mg BID  - delsym cough syrup  - supplemental oxygen - RT consult for resp support  - flutter valve  Acute HFpEF - continue IV lasix 40 mg BID - not sure she is taking regularly at home - monitor weight, I/O, electrolytes - pt had recent TTE with last admission Oct 2023  Bilateral Lower Extremity Edema - continue IV lasix as ordered - holding amlodipine 10 mg as this can cause edema - elevate legs  - TED hoses to BLEs   Essential hypertension  - resume home meds EXCEPT amlodipine - monitor BP closely and adjust treatment plan as needed  Stage IV CKD - he is followed by Dr. Theador Hawthorne (nephrologist) - monitoring renal function while on IV furosemide  Type 2 Diabetes Mellitus - continue SSI coverage with CBG monitoring (renal doses)  Acquired Thrombophilia - apixaban 5 mg BID has been  resumed  PE - resumed home apixaban as above   Hyperlipidemia - resume home atorvastatin daily   DVT prophylaxis: apixaban   Code Status: Full   Family Communication: wife at bedside  Disposition Plan: home   Consults called:   Admission status: INP  Level of care: Med-Surg Irwin Brakeman MD [facility] How to contact the [facility] Attending or Consulting provider 7A - 7P or  covering provider during after hours [place], for this patient?  Check the care team in [facility] and look for a) attending/consulting [facility] provider listed and b) the [facility] team listed Log into [email or web address] and use [place] universal password to access. If you do not have the password, please contact the hospital operator. Locate the [facility] provider you are looking for under [facility] and page to a number that you can be directly reached. If you still have difficulty reaching the provider, please page the [facility] (Director on Call) for the Hospitalists listed on amion for assistance.  If 7PM-7AM, please contact night-coverage [email or web address] Password [facility]  10/20/2022, 12:59 PM

## 2022-10-20 NOTE — ED Triage Notes (Signed)
Pt c/o sob with bilateral leg swelling x 2 weeks

## 2022-10-20 NOTE — ED Notes (Signed)
Pt resting in bed, side rails up x2 and bed in low locked position. No s/s of acute distress. Pt on CCM, 2L oxygen by nasal cannula

## 2022-10-20 NOTE — Hospital Course (Addendum)
76 year old gentleman with stage IV CKD, hypertension, type 2 diabetes mellitus, pulmonary embolus on apixaban, OSA, diastolic heart failure presented to the emergency department complaining of progressive shortness of breath and increasing edema in bilateral lower extremities over the past week.  He has been coughing and wheezing frequently increasing in sputum production and malaise.  He has been using his home bronchodilators but does not seem to be able to control the symptoms.  He denies fever and chills.  He denies nausea vomiting and diarrhea.  He was noted in the ED to have 3+ pitting edema bilateral lower extremities and diffuse wheezing.  He was started on IV Lasix and treatment for acute COPD exacerbation.  Patient reports no chest pain symptoms.  Patient reports he may not have been taking his Lasix regularly in the last several weeks.  He is being admitted for further management. He was started on IV steroids and IV furosemide with clinical improvement.  His IV furosemide was discontinued secondary to increasing serum creatinine.  He remained stable from a fluid standpoint.  He was continued on IV steroids.  His oxygen was gradually decreased.  His hospitalization was prolonged secondary to sustained SVT.  Ultimately, amiodarone was started.  His Coreg was converted to metoprolol, and diltiazem was started.  Ultimately, with this combination of medications, his SVT subsided with no further sustained or nonsustained runs.  He remained hemodynamically stable.  He remained mildly hypoxic with ambulation.  He will be set up with 2 L for discharge.  Regarding his heart failure, the patient will be restarted on furosemide 40 mg daily.  His renal function returned back to his baseline at the time of discharge.

## 2022-10-21 ENCOUNTER — Inpatient Hospital Stay (HOSPITAL_COMMUNITY): Payer: Medicare HMO

## 2022-10-21 DIAGNOSIS — D631 Anemia in chronic kidney disease: Secondary | ICD-10-CM

## 2022-10-21 DIAGNOSIS — J9601 Acute respiratory failure with hypoxia: Secondary | ICD-10-CM

## 2022-10-21 DIAGNOSIS — J441 Chronic obstructive pulmonary disease with (acute) exacerbation: Secondary | ICD-10-CM | POA: Diagnosis not present

## 2022-10-21 DIAGNOSIS — N184 Chronic kidney disease, stage 4 (severe): Secondary | ICD-10-CM | POA: Diagnosis not present

## 2022-10-21 DIAGNOSIS — R531 Weakness: Secondary | ICD-10-CM

## 2022-10-21 DIAGNOSIS — E785 Hyperlipidemia, unspecified: Secondary | ICD-10-CM | POA: Diagnosis not present

## 2022-10-21 LAB — BASIC METABOLIC PANEL
Anion gap: 7 (ref 5–15)
BUN: 28 mg/dL — ABNORMAL HIGH (ref 8–23)
CO2: 32 mmol/L (ref 22–32)
Calcium: 8.3 mg/dL — ABNORMAL LOW (ref 8.9–10.3)
Chloride: 97 mmol/L — ABNORMAL LOW (ref 98–111)
Creatinine, Ser: 2.77 mg/dL — ABNORMAL HIGH (ref 0.61–1.24)
GFR, Estimated: 23 mL/min — ABNORMAL LOW (ref >60)
Glucose, Bld: 147 mg/dL — ABNORMAL HIGH (ref 70–99)
Potassium: 4.7 mmol/L (ref 3.5–5.1)
Sodium: 136 mmol/L (ref 135–145)

## 2022-10-21 LAB — MAGNESIUM: Magnesium: 2 mg/dL (ref 1.7–2.4)

## 2022-10-21 LAB — CBC
HCT: 32.6 % — ABNORMAL LOW (ref 39.0–52.0)
Hemoglobin: 10.1 g/dL — ABNORMAL LOW (ref 13.0–17.0)
MCH: 27.7 pg (ref 26.0–34.0)
MCHC: 31 g/dL (ref 30.0–36.0)
MCV: 89.6 fL (ref 80.0–100.0)
Platelets: 182 10*3/uL (ref 150–400)
RBC: 3.64 MIL/uL — ABNORMAL LOW (ref 4.22–5.81)
RDW: 13.4 % (ref 11.5–15.5)
WBC: 3.9 10*3/uL — ABNORMAL LOW (ref 4.0–10.5)
nRBC: 0 % (ref 0.0–0.2)

## 2022-10-21 LAB — BRAIN NATRIURETIC PEPTIDE: B Natriuretic Peptide: 314 pg/mL — ABNORMAL HIGH (ref 0.0–100.0)

## 2022-10-21 LAB — GLUCOSE, CAPILLARY
Glucose-Capillary: 159 mg/dL — ABNORMAL HIGH (ref 70–99)
Glucose-Capillary: 116 mg/dL — ABNORMAL HIGH (ref 70–99)
Glucose-Capillary: 159 mg/dL — ABNORMAL HIGH (ref 70–99)
Glucose-Capillary: 179 mg/dL — ABNORMAL HIGH (ref 70–99)
Glucose-Capillary: 157 mg/dL — ABNORMAL HIGH (ref 70–99)

## 2022-10-21 MED ORDER — LIVING BETTER WITH HEART FAILURE BOOK: Freq: Once | Status: AC

## 2022-10-21 MED ORDER — FUROSEMIDE 10 MG/ML IJ SOLN
60.0000 mg | Freq: Every day | INTRAMUSCULAR | Status: DC
  Administered 2022-10-22: 60 mg via INTRAVENOUS
  Filled 2022-10-21 (×2): qty 6

## 2022-10-21 MED ORDER — ALBUTEROL SULFATE (2.5 MG/3ML) 0.083% IN NEBU
INHALATION_SOLUTION | RESPIRATORY_TRACT | Status: AC
  Administered 2022-10-21: 2.5 mg
  Filled 2022-10-21: qty 3

## 2022-10-21 MED ORDER — IPRATROPIUM-ALBUTEROL 0.5-2.5 (3) MG/3ML IN SOLN
3.0000 mL | RESPIRATORY_TRACT | Status: DC
  Administered 2022-10-21 – 2022-10-22 (×4): 3 mL via RESPIRATORY_TRACT
  Filled 2022-10-21 (×4): qty 3

## 2022-10-21 MED ORDER — IPRATROPIUM-ALBUTEROL 0.5-2.5 (3) MG/3ML IN SOLN: 3.0000 mL | RESPIRATORY_TRACT | Status: DC

## 2022-10-21 NOTE — Progress Notes (Signed)
PROGRESS NOTE   Andrew Adams  NFA:[id number] DOB: 1946-10-31 DOA: 10/20/2022 PCP: Lavella Lemons, PA   Chief Complaint  Patient presents with   Shortness of Breath   Level of care: Med-Surg  Brief Admission History:  76 year old gentleman with stage IV CKD, hypertension, type 2 diabetes mellitus, pulmonary embolus on apixaban, OSA, diastolic heart failure presented to the emergency department complaining of progressive shortness of breath and increasing edema in bilateral lower extremities over the past week.  He has been coughing and wheezing frequently increasing in sputum production and malaise.  He has been using his home bronchodilators but does not seem to be able to control the symptoms.  He denies fever and chills.  He denies nausea vomiting and diarrhea.  He was noted in the ED to have 3+ pitting edema bilateral lower extremities and diffuse wheezing.  He was started on IV Lasix and treatment for acute COPD exacerbation.  Patient reports no chest pain symptoms.  Patient reports he may not have been taking his Lasix regularly in the last several weeks.  He is being admitted for further management.   Assessment and Plan:  COPD with acute exacerbation  - continue scheduled bronchodilators - doxycycline 100 mg BID - mucinex 1200 mg BID  - delsym cough syrup  - supplemental oxygen - RT consult for resp support  - flutter valve   Acute HFpEF - continue IV lasix 40 mg BID - not sure she is taking regularly at home - monitor weight, I/O, electrolytes - pt had recent TTE with last admission Oct 2023   Bilateral Lower Extremity Edema - continue IV lasix as ordered - holding amlodipine 10 mg as this can cause edema - elevate legs  - TED hoses to BLEs    Essential hypertension  - resume home meds EXCEPT amlodipine - monitor BP closely and adjust treatment plan as needed   Stage IV CKD - he is followed by Dr. Theador Hawthorne (nephrologist) - he will need to follow up with Dr.  Theador Hawthorne as an outpatient    Type 2 Diabetes Mellitus - continue SSI coverage with CBG monitoring (renal doses)  CBG (last 3)  Recent Labs    10/21/22 0813 10/21/22 1201 10/21/22 1652  GLUCAP 116* 159* 179*    Acquired Thrombophilia - apixaban 5 mg BID has been resumed   PE - resumed home apixaban as above    Hyperlipidemia - resumed home atorvastatin daily     DVT prophylaxis: apixaban Code Status: Full  Family Communication: wife  Disposition: Status is: Inpatient Remains inpatient appropriate because: IV treatments    Consultants:   Procedures:   Antimicrobials:    Subjective: Pt says he still feels short of breath.  No chest pain.  Edema in legs is somewhat better.   Objective: Vitals:   10/20/22 2100 10/20/22 2344 10/21/22 0332 10/21/22 1411  BP:  132/82 129/76 122/68  Pulse:  82 88 89  Resp:  18 20 (!) 21  Temp:  98.1 F (36.7 C) (!) 97.1 F (36.2 C) 98 F (36.7 C)  TempSrc:  Oral  Oral  SpO2:  94% 94% 96%  Weight: 105.8 kg  103.2 kg   Height:        Intake/Output Summary (Last 24 hours) at 10/21/2022 1743 Last data filed at 10/21/2022 1300 Gross per 24 hour  Intake 720 ml  Output 1725 ml  Net -1005 ml   Filed Weights   10/20/22 0818 10/20/22 2100 10/21/22 0332  Weight: 97.5  kg 105.8 kg 103.2 kg   Examination:  General exam: Appears calm and comfortable  Respiratory system: Clear to auscultation. Respiratory effort normal. Cardiovascular system: normal S1 & S2 heard. No JVD, murmurs, rubs, gallops or clicks. No pedal edema. Gastrointestinal system: Abdomen is nondistended, soft and nontender. No organomegaly or masses felt. Normal bowel sounds heard. Central nervous system: Alert and oriented. No focal neurological deficits. Extremities: Symmetric 5 x 5 power. Skin: No rashes, lesions or ulcers. Psychiatry: Judgement and insight appear normal. Mood & affect appropriate.   Data Reviewed: I have personally reviewed following labs and  imaging studies  CBC: Recent Labs  Lab 10/20/22 0901 10/21/22 0312  WBC 4.2 3.9*  NEUTROABS 2.3  --   HGB 9.8* 10.1*  HCT 30.8* 32.6*  MCV 87.7 89.6  PLT 158 542    Basic Metabolic Panel: Recent Labs  Lab 10/20/22 0901 10/21/22 0312  NA 138 136  K 3.8 4.7  CL 98 97*  CO2 31 32  GLUCOSE 100* 147*  BUN 20 28*  CREATININE 2.29* 2.77*  CALCIUM 8.3* 8.3*  MG  --  2.0    CBG: Recent Labs  Lab 10/20/22 1810 10/21/22 0334 10/21/22 0813 10/21/22 1201 10/21/22 1652  GLUCAP 141* 159* 116* 159* 179*    Recent Results (from the past 240 hour(s))  Resp panel by RT-PCR (RSV, Flu A&B, Covid) Anterior Nasal Swab     Status: None   Collection Time: 10/20/22  9:08 AM   Specimen: Anterior Nasal Swab  Result Value Ref Range Status   SARS Coronavirus 2 by RT PCR NEGATIVE NEGATIVE Final    Comment: (NOTE) SARS-CoV-2 target nucleic acids are NOT DETECTED.  The SARS-CoV-2 RNA is generally detectable in upper respiratory specimens during the acute phase of infection. The lowest concentration of SARS-CoV-2 viral copies this assay can detect is 138 copies/mL. A negative result does not preclude SARS-Cov-2 infection and should not be used as the sole basis for treatment or other patient management decisions. A negative result may occur with  improper specimen collection/handling, submission of specimen other than nasopharyngeal swab, presence of viral mutation(s) within the areas targeted by this assay, and inadequate number of viral copies(<138 copies/mL). A negative result must be combined with clinical observations, patient history, and epidemiological information. The expected result is Negative.  Fact Sheet for Patients:  EntrepreneurPulse.com.au  Fact Sheet for Healthcare Providers:  IncredibleEmployment.be  This test is no t yet approved or cleared by the [place] FDA and  has been authorized for detection and/or diagnosis of  SARS-CoV-2 by FDA under an Emergency Use Authorization (EUA). This EUA will remain  in effect (meaning this test can be used) for the duration of the COVID-19 declaration under Section 564(b)(1) of the Act, 21 U.S.C.section 360bbb-3(b)(1), unless the authorization is terminated  or revoked sooner.       Influenza A by PCR NEGATIVE NEGATIVE Final   Influenza B by PCR NEGATIVE NEGATIVE Final    Comment: (NOTE) The Xpert Xpress SARS-CoV-2/FLU/RSV plus assay is intended as an aid in the diagnosis of influenza from Nasopharyngeal swab specimens and should not be used as a sole basis for treatment. Nasal washings and aspirates are unacceptable for Xpert Xpress SARS-CoV-2/FLU/RSV testing.  Fact Sheet for Patients: EntrepreneurPulse.com.au  Fact Sheet for Healthcare Providers: IncredibleEmployment.be  This test is not yet approved or cleared by the [place] FDA and has been authorized for detection and/or diagnosis of SARS-CoV-2 by FDA under an Emergency Use Authorization (EUA). This  EUA will remain in effect (meaning this test can be used) for the duration of the COVID-19 declaration under Section 564(b)(1) of the Act, 21 U.S.C. section 360bbb-3(b)(1), unless the authorization is terminated or revoked.     Resp Syncytial Virus by PCR NEGATIVE NEGATIVE Final    Comment: (NOTE) Fact Sheet for Patients: EntrepreneurPulse.com.au  Fact Sheet for Healthcare Providers: IncredibleEmployment.be  This test is not yet approved or cleared by the [place] FDA and has been authorized for detection and/or diagnosis of SARS-CoV-2 by FDA under an Emergency Use Authorization (EUA). This EUA will remain in effect (meaning this test can be used) for the duration of the COVID-19 declaration under Section 564(b)(1) of the Act, 21 U.S.C. section 360bbb-3(b)(1), unless the authorization is terminated  or revoked.  Performed at [facility], [address], [place], [place] [address]      Radiology Studies: [facility] Chest [facility] 1 View  Result Date: 10/21/2022 CLINICAL DATA:  [id number] CHF (congestive heart failure) [facility]) [phone] [id number] COPD with acute exacerbation ([place]) [id number] EXAM: PORTABLE CHEST 1 VIEW COMPARISON:  October 20, 2022 FINDINGS: The cardiomediastinal silhouette is unchanged in contour. Trace bilateral pleural effusions. No pneumothorax. Chronic bronchitic markings. Perihilar vascular fullness without overt edema. Bibasilar platelike opacities. IMPRESSION: 1. Bibasilar platelike opacities, favored to reflect atelectasis. Favored trace bilateral pleural effusions. 2. Changes compatible with chronic bronchitis. Electronically Signed   By: Valentino Saxon M.D.   On: 10/21/2022 07:51   DG Chest Portable 1 View  Result Date: 10/20/2022 CLINICAL DATA:  Short of breath. EXAM: PORTABLE CHEST 1 VIEW COMPARISON:  Chest 08/18/2022 FINDINGS: Mild cardiac enlargement.  Negative for heart failure. Elevated right hemidiaphragm with mild right lower lobe atelectasis. Left lung clear. No effusion IMPRESSION: Elevated right hemidiaphragm with mild right lower lobe atelectasis. Electronically Signed   By: Franchot Gallo M.D.   On: 10/20/2022 09:33    Scheduled Meds:  allopurinol  100 mg Oral QHS   apixaban  5 mg Oral BID   atorvastatin  40 mg Oral QHS   carvedilol  12.5 mg Oral BID WC   doxycycline  100 mg Oral Q12H   ferrous sulfate  325 mg Oral Q breakfast   [START ON 10/22/2022] furosemide  60 mg Intravenous Daily   guaiFENesin  1,200 mg Oral BID   insulin aspart  0-9 Units Subcutaneous TID WC   irbesartan  75 mg Oral Daily   Living Better with Heart Failure Book   Does not apply Once   methylPREDNISolone (SOLU-MEDROL) injection  40 mg Intravenous Q12H   multivitamin with minerals  1 tablet Oral Daily   pantoprazole  40 mg Oral BID AC   Continuous Infusions:   LOS: 1 day   Time spent: 36  mins  Valicia Rief Wynetta Emery, MD How to contact the [facility] Attending or Consulting provider [place] or covering provider during after hours [place], for this patient?  Check the care team in [facility] and look for a) attending/consulting TRH provider listed and b) the [facility] team listed Log into [email or web address] and use [place]'s universal password to access. If you do not have the password, please contact the hospital operator. Locate the [facility] provider you are looking for under [facility] and page to a number that you can be directly reached. If you still have difficulty reaching the provider, please page the [facility] (Director on Call) for the Hospitalists listed on amion for assistance.  10/21/2022, 5:43 PM

## 2022-10-21 NOTE — Evaluation (Signed)
Physical Therapy Evaluation Patient Details Name: Andrew Adams MRN: [id number] DOB: 09/27/1946 Today's Date: 10/21/2022  History of Present Illness  Andrew Adams is a 76 year old gentleman with stage IV CKD, hypertension, type 2 diabetes mellitus, pulmonary embolus on apixaban, OSA, diastolic heart failure presented to the emergency department complaining of progressive shortness of breath and increasing edema in bilateral lower extremities over the past week.  He has been coughing and wheezing frequently increasing in sputum production and malaise.  He has been using his home bronchodilators but does not seem to be able to control the symptoms.  He denies fever and chills.  He denies nausea vomiting and diarrhea.  He was noted in the ED to have 3+ pitting edema bilateral lower extremities and diffuse wheezing.  He was started on IV Lasix and treatment for acute COPD exacerbation.  Patient reports no chest pain symptoms.  Patient reports he may not have been taking his Lasix regularly in the last several weeks.  He is being admitted for further management.    Clinical Impression  Patient O2 sat monitored throughout session: 93% at rest on 2L, 90% while ambulating first 175 feet which then decreased to 86% with SOB on 2 L. O2 sat took several minutes to return to >90%. Patient does not require assist or use of AD for mobility completed. Patient ends session seated in chair. Patient discharged to care of nursing for ambulation daily as tolerated for length of stay.       Recommendations for follow up therapy are one component of a multi-disciplinary discharge planning process, led by the attending physician.  Recommendations may be updated based on patient status, additional functional criteria and insurance authorization.  Follow Up Recommendations No PT follow up      Assistance Recommended at Discharge PRN  Patient can return home with the following       Equipment Recommendations  None recommended by PT  Recommendations for Other Services       Functional Status Assessment Patient has had a recent decline in their functional status and demonstrates the ability to make significant improvements in function in a reasonable and predictable amount of time.     Precautions / Restrictions Precautions Precautions: Fall Restrictions Weight Bearing Restrictions: No      Mobility  Bed Mobility Overal bed mobility: Independent                  Transfers Overall transfer level: Independent Equipment used: None                    Ambulation/Gait Ambulation/Gait assistance: Independent Gait Distance (Feet): 200 Feet Assistive device: None Gait Pattern/deviations: Step-through pattern          Stairs            Wheelchair Mobility    Modified Rankin (Stroke Patients Only)       Balance Overall balance assessment: Mild deficits observed, not formally tested                                           Pertinent Vitals/Pain Pain Assessment Pain Assessment: No/denies pain    Home Living Family/patient expects to be discharged to:: Private residence Living Arrangements: Spouse/significant other Available Help at Discharge: Family Type of Home: House Home Access: Level entry       Home Layout: One level Home Equipment:  Rolling Walker (2 wheels);Cane - single point      Prior Function Prior Level of Function : Independent/Modified Independent;Working/employed;Driving             Mobility Comments: Patient stating community ambulation without AD, independent, 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       Hand Dominance        Extremity/Trunk Assessment   Upper Extremity Assessment Upper Extremity Assessment: Overall WFL for tasks assessed    Lower Extremity Assessment Lower Extremity Assessment: Overall WFL for tasks assessed    Cervical / Trunk Assessment Cervical / Trunk  Assessment: Normal  Communication   Communication: No difficulties  Cognition Arousal/Alertness: Awake/alert Behavior During Therapy: WFL for tasks assessed/performed Overall Cognitive Status: Within Functional Limits for tasks assessed                                          General Comments      Exercises     Assessment/Plan    PT Assessment Patient does not need any further PT services  PT Problem List         PT Treatment Interventions      PT Goals (Current goals can be found in the Care Plan section)  Acute Rehab PT Goals Patient Stated Goal: return home PT Goal Formulation: With patient Time For Goal Achievement: 10/21/22 Potential to Achieve Goals: Good    Frequency       Co-evaluation               AM-PAC PT "6 Clicks" Mobility  Outcome Measure Help needed turning from your back to your side while in a flat bed without using bedrails?: None Help needed moving from lying on your back to sitting on the side of a flat bed without using bedrails?: None Help needed moving to and from a bed to a chair (including a wheelchair)?: None Help needed standing up from a chair using your arms (e.g., wheelchair or bedside chair)?: None Help needed to walk in hospital room?: None Help needed climbing 3-5 steps with a railing? : None 6 Click Score: 24    End of Session Equipment Utilized During Treatment: Oxygen Activity Tolerance: Patient tolerated treatment well;Patient limited by fatigue Patient left: in chair;with call bell/phone within reach Nurse Communication: Mobility status PT Visit Diagnosis: Unsteadiness on feet (R26.81);Other abnormalities of gait and mobility (R26.89)    Time: [id number] PT Time Calculation (min) (ACUTE ONLY): 36 min   Charges:   PT Evaluation $PT Eval Low Complexity: 1 Low PT Treatments $Therapeutic Activity: 23-37 mins        12:00 PM, 10/21/22 Mearl Latin PT, DPT Physical Therapist at [facility]

## 2022-10-21 NOTE — TOC Initial Note (Signed)
Transition of Care [facility]) - Initial/Assessment Note    Patient Details  Name: Andrew Adams MRN: [id number] Date of Birth: [birth year]  [facility]) CM/SW Contact:    Boneta Lucks, RN Phone Number: 10/21/2022, 3:31 PM  Clinical Narrative:       Patient admitted with COPD. Patient has a high risk for readmission. Wife at the bedside. CM spoke to both. Patient is not interested in Home health education for CHF.  He is agreeable to look over Living better book and wife will assist him with the education. Ordered, RN to deliver. Patient drives himself and is independent at baseline.            Expected Discharge Plan: Home/Self Care Barriers to Discharge: Continued Medical Work up   Patient Goals and CMS Choice Patient states their goals for this hospitalization and ongoing recovery are:: to go home. CMS Medicare.gov Compare Post Acute Care list provided to:: Patient Choice offered to / list presented to : Patient     Expected Discharge Plan and Services      Living arrangements for the past 2 months: Single Family Home                   Prior Living Arrangements/Services Living arrangements for the past 2 months: Single Family Home Lives with:: Spouse Patient language and need for interpreter reviewed:: Yes        Need for Family Participation in Patient Care: Yes (Comment) Care giver support system in place?: Yes (comment)   Criminal Activity/Legal Involvement Pertinent to Current Situation/Hospitalization: No - Comment as needed  Activities of Daily Living Home Assistive Devices/Equipment: None ADL Screening (condition at time of admission) Patient's cognitive ability adequate to safely complete daily activities?: Yes Is the patient deaf or have difficulty hearing?: No Does the patient have difficulty seeing, even when wearing glasses/contacts?: No Does the patient have difficulty concentrating, remembering, or making decisions?: Yes Patient able to  express need for assistance with ADLs?: No Does the patient have difficulty dressing or bathing?: Yes Independently performs ADLs?: Yes (appropriate for developmental age) Does the patient have difficulty walking or climbing stairs?: No Weakness of Legs: None Weakness of Arms/Hands: None  Permission Sought/Granted           Permission granted to share info w Relationship: wife     Emotional Assessment       Orientation: : Oriented to Self, Oriented to Place, Oriented to  Time, Oriented to Situation Alcohol / Substance Use: Not Applicable Psych Involvement: No (comment)  Admission diagnosis:  Shortness of breath [R06.02] Hypoxia [R09.02] COPD with acute exacerbation ([place]) [J44.1] Patient Active Problem List   Diagnosis Date Noted   COPD with acute exacerbation ([place]) 10/20/2022   Pulmonary embolism ([place]) 08/18/2022   Controlled type 2 diabetes mellitus without complication, without long-term current use of insulin ([place]) 08/18/2022   Chronic diastolic CHF (congestive heart failure) ([place]) 08/18/2022   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   CKD (chronic kidney disease), stage IV ([place]) 10/19/2021   Anemia due to chronic kidney disease 10/19/2021   Small bowel obstruction due to adhesions ([place]) 10/17/2021   Moderate obstructive sleep apnea 06/24/2020   COPD (chronic obstructive pulmonary disease) ([place]) 06/24/2020   Swelling of right wrist 06/24/2020   Anemia 05/07/2020   Chronic respiratory failure with hypoxia ([place]) 04/03/2020   Acute congestive heart failure ([place])    Acute respiratory failure with hypoxia ([place]) 02/10/2020   CAP (community acquired pneumonia) 02/10/2020   Generalized  weakness 02/10/2020   DOE (dyspnea on exertion) 02/10/2020   Chronic alcohol abuse 02/10/2020   Varicose veins of bilateral lower extremities with other complications 62/94/7654   Lumbar disc herniation 01/27/2014   Sciatica 01/09/2014   Acute back pain 01/09/2014   Essential hypertension 04/12/2013    Hyperlipidemia 04/12/2013   GERD (gastroesophageal reflux disease) 04/12/2013   Palpitation 02/06/2013   PCP:  Lavella Lemons, PA Pharmacy:   [place], [place] - [address] [place] [address] [place] [address] Phone: [phone] Fax: [phone]  [place] [address], [place] [address] Phone: [phone] Fax: [phone]    Social Determinants of Health (SDOH) Social History: [place]: No Food Insecurity (10/20/2022)  Housing: Low Risk  (10/20/2022)  Transportation Needs: No Transportation Needs (10/20/2022)  Utilities: Not At Risk (10/20/2022)  Tobacco Use: Medium Risk (10/20/2022)   SDOH Interventions:    Readmission Risk Interventions    10/21/2022    3:31 PM  Readmission Risk Prevention Plan  Transportation Screening Complete  PCP or Specialist Appt within 3-5 Days Not Complete  HRI or [place] Complete  Social Work Consult for [place] Planning/Counseling Complete  Palliative Care Screening Not Applicable  Medication Review Press photographer) Complete

## 2022-10-22 DIAGNOSIS — J441 Chronic obstructive pulmonary disease with (acute) exacerbation: Secondary | ICD-10-CM | POA: Diagnosis not present

## 2022-10-22 DIAGNOSIS — N184 Chronic kidney disease, stage 4 (severe): Secondary | ICD-10-CM | POA: Diagnosis not present

## 2022-10-22 DIAGNOSIS — E785 Hyperlipidemia, unspecified: Secondary | ICD-10-CM | POA: Diagnosis not present

## 2022-10-22 DIAGNOSIS — J9601 Acute respiratory failure with hypoxia: Secondary | ICD-10-CM | POA: Diagnosis not present

## 2022-10-22 LAB — GLUCOSE, CAPILLARY
Glucose-Capillary: 178 mg/dL — ABNORMAL HIGH (ref 70–99)
Glucose-Capillary: 139 mg/dL — ABNORMAL HIGH (ref 70–99)
Glucose-Capillary: 179 mg/dL — ABNORMAL HIGH (ref 70–99)
Glucose-Capillary: 221 mg/dL — ABNORMAL HIGH (ref 70–99)
Glucose-Capillary: 177 mg/dL — ABNORMAL HIGH (ref 70–99)

## 2022-10-22 MED ORDER — IPRATROPIUM-ALBUTEROL 0.5-2.5 (3) MG/3ML IN SOLN
3.0000 mL | Freq: Three times a day (TID) | RESPIRATORY_TRACT | Status: DC
  Administered 2022-10-22 – 2022-10-23 (×3): 3 mL via RESPIRATORY_TRACT
  Filled 2022-10-22 (×3): qty 3

## 2022-10-22 MED ORDER — METHYLPREDNISOLONE SODIUM SUCC 125 MG IJ SOLR
60.0000 mg | Freq: Two times a day (BID) | INTRAMUSCULAR | Status: DC
  Administered 2022-10-22 – 2022-10-25 (×6): 60 mg via INTRAVENOUS
  Filled 2022-10-22 (×6): qty 2

## 2022-10-22 NOTE — Progress Notes (Signed)
Patient slept throughout the night, no complaints of pain or discomfort expressed at this time.

## 2022-10-22 NOTE — Progress Notes (Signed)
Patient declined use of BiPAP/CPAP as he doesn't use at home even though he has one. He probably needs to use CPAP.

## 2022-10-22 NOTE — Progress Notes (Signed)
Patient still declines CPAP. No unit in room.

## 2022-10-22 NOTE — Progress Notes (Signed)
PROGRESS NOTE   Andrew Adams  DDU:[id number] DOB: August 27, 1946 DOA: 10/20/2022 PCP: Lavella Lemons, PA   Chief Complaint  Patient presents with   Shortness of Breath   Level of care: Med-Surg  Brief Admission History:  76 year old gentleman with stage IV CKD, hypertension, type 2 diabetes mellitus, pulmonary embolus on apixaban, OSA, diastolic heart failure presented to the emergency department complaining of progressive shortness of breath and increasing edema in bilateral lower extremities over the past week.  He has been coughing and wheezing frequently increasing in sputum production and malaise.  He has been using his home bronchodilators but does not seem to be able to control the symptoms.  He denies fever and chills.  He denies nausea vomiting and diarrhea.  He was noted in the ED to have 3+ pitting edema bilateral lower extremities and diffuse wheezing.  He was started on IV Lasix and treatment for acute COPD exacerbation.  Patient reports no chest pain symptoms.  Patient reports he may not have been taking his Lasix regularly in the last several weeks.  He is being admitted for further management.   Assessment and Plan:  COPD with acute exacerbation  - continue scheduled bronchodilators - doxycycline 100 mg BID - mucinex 1200 mg BID  - delsym cough syrup  - supplemental oxygen - RT consult for resp support  - flutter valve -continue IV solumedrol BID    Acute HFpEF - continue IV lasix 40 mg BID - not sure she is taking regularly at home - monitor weight, I/O, electrolytes - pt had recent TTE with last admission Oct 2023   Bilateral Lower Extremity Edema - continue IV lasix as ordered - holding amlodipine 10 mg as this can cause severe LE edema - elevate legs  - TED hoses to BLEs    Essential hypertension  - resume home meds EXCEPT amlodipine - monitor BP closely and adjust treatment plan as needed   Stage IV CKD - he is followed by Dr. Theador Hawthorne (nephrologist) -  he will need to follow up with Dr. Theador Hawthorne as an outpatient    Type 2 Diabetes Mellitus - continue SSI coverage with CBG monitoring (renal doses)  CBG (last 3)  Recent Labs    10/22/22 0451 10/22/22 0723 10/22/22 1112  GLUCAP 178* 139* 179*    Acquired Thrombophilia - apixaban 5 mg BID has been resumed   PE - resumed home apixaban as above    Hyperlipidemia - resumed home atorvastatin daily     DVT prophylaxis: apixaban Code Status: Full  Family Communication: wife  Disposition: Status is: Inpatient Remains inpatient appropriate because: IV treatments    Consultants:   Procedures:   Antimicrobials:    Subjective: Pt says not much improvement in breathing.  He says he feels his leg edema is much better.    Objective: Vitals:   10/22/22 0348 10/22/22 0355 10/22/22 0500 10/22/22 0732  BP: 128/76     Pulse: 98     Resp: 18     Temp: 98.3 F (36.8 C)     TempSrc:      SpO2: 92% 90%  93%  Weight:   104.3 kg   Height:        Intake/Output Summary (Last 24 hours) at 10/22/2022 1153 Last data filed at 10/22/2022 0500 Gross per 24 hour  Intake 480 ml  Output 1200 ml  Net -720 ml   Filed Weights   10/20/22 2100 10/21/22 0332 10/22/22 0500  Weight: 105.8 kg 103.2  kg 104.3 kg   Examination:  General exam: Appears calm and comfortable  Respiratory system: diffuse expiratory wheezing heard bilaterally.  Cardiovascular system: normal S1 & S2 heard. No JVD, murmurs, rubs, gallops or clicks. No pedal edema. Gastrointestinal system: Abdomen is nondistended, soft and nontender. No organomegaly or masses felt. Normal bowel sounds heard. Central nervous system: Alert and oriented. No focal neurological deficits. Extremities: Symmetric 5 x 5 power. Skin: No rashes, lesions or ulcers. Psychiatry: Judgement and insight appear poor. Mood & affect appropriate.   Data Reviewed: I have personally reviewed following labs and imaging studies  CBC: Recent Labs  Lab  10/20/22 0901 10/21/22 0312  WBC 4.2 3.9*  NEUTROABS 2.3  --   HGB 9.8* 10.1*  HCT 30.8* 32.6*  MCV 87.7 89.6  PLT 158 798    Basic Metabolic Panel: Recent Labs  Lab 10/20/22 0901 10/21/22 0312  NA 138 136  K 3.8 4.7  CL 98 97*  CO2 31 32  GLUCOSE 100* 147*  BUN 20 28*  CREATININE 2.29* 2.77*  CALCIUM 8.3* 8.3*  MG  --  2.0    CBG: Recent Labs  Lab 10/21/22 1652 10/21/22 2018 10/22/22 0451 10/22/22 0723 10/22/22 1112  GLUCAP 179* 157* 178* 139* 179*    Recent Results (from the past 240 hour(s))  Resp panel by RT-PCR (RSV, Flu A&B, Covid) Anterior Nasal Swab     Status: None   Collection Time: 10/20/22  9:08 AM   Specimen: Anterior Nasal Swab  Result Value Ref Range Status   SARS Coronavirus 2 by RT PCR NEGATIVE NEGATIVE Final    Comment: (NOTE) SARS-CoV-2 target nucleic acids are NOT DETECTED.  The SARS-CoV-2 RNA is generally detectable in upper respiratory specimens during the acute phase of infection. The lowest concentration of SARS-CoV-2 viral copies this assay can detect is 138 copies/mL. A negative result does not preclude SARS-Cov-2 infection and should not be used as the sole basis for treatment or other patient management decisions. A negative result may occur with  improper specimen collection/handling, submission of specimen other than nasopharyngeal swab, presence of viral mutation(s) within the areas targeted by this assay, and inadequate number of viral copies(<138 copies/mL). A negative result must be combined with clinical observations, patient history, and epidemiological information. The expected result is Negative.  Fact Sheet for Patients:  EntrepreneurPulse.com.au  Fact Sheet for Healthcare Providers:  IncredibleEmployment.be  This test is no t yet approved or cleared by the [place] FDA and  has been authorized for detection and/or diagnosis of SARS-CoV-2 by FDA under an Emergency Use  Authorization (EUA). This EUA will remain  in effect (meaning this test can be used) for the duration of the COVID-19 declaration under Section 564(b)(1) of the Act, 21 U.S.C.section 360bbb-3(b)(1), unless the authorization is terminated  or revoked sooner.       Influenza A by PCR NEGATIVE NEGATIVE Final   Influenza B by PCR NEGATIVE NEGATIVE Final    Comment: (NOTE) The Xpert Xpress SARS-CoV-2/FLU/RSV plus assay is intended as an aid in the diagnosis of influenza from Nasopharyngeal swab specimens and should not be used as a sole basis for treatment. Nasal washings and aspirates are unacceptable for Xpert Xpress SARS-CoV-2/FLU/RSV testing.  Fact Sheet for Patients: EntrepreneurPulse.com.au  Fact Sheet for Healthcare Providers: IncredibleEmployment.be  This test is not yet approved or cleared by the [place] FDA and has been authorized for detection and/or diagnosis of SARS-CoV-2 by FDA under an Emergency Use Authorization (EUA). This EUA will  remain in effect (meaning this test can be used) for the duration of the COVID-19 declaration under Section 564(b)(1) of the Act, 21 U.S.C. section 360bbb-3(b)(1), unless the authorization is terminated or revoked.     Resp Syncytial Virus by PCR NEGATIVE NEGATIVE Final    Comment: (NOTE) Fact Sheet for Patients: EntrepreneurPulse.com.au  Fact Sheet for Healthcare Providers: IncredibleEmployment.be  This test is not yet approved or cleared by the [place] FDA and has been authorized for detection and/or diagnosis of SARS-CoV-2 by FDA under an Emergency Use Authorization (EUA). This EUA will remain in effect (meaning this test can be used) for the duration of the COVID-19 declaration under Section 564(b)(1) of the Act, 21 U.S.C. section 360bbb-3(b)(1), unless the authorization is terminated or revoked.  Performed at [facility], [address],  [place], [place] [address]      Radiology Studies: [facility] Chest [facility] 1 View  Result Date: 10/21/2022 CLINICAL DATA:  [id number] CHF (congestive heart failure) [facility]) [phone] [id number] COPD with acute exacerbation ([place]) [id number] EXAM: PORTABLE CHEST 1 VIEW COMPARISON:  October 20, 2022 FINDINGS: The cardiomediastinal silhouette is unchanged in contour. Trace bilateral pleural effusions. No pneumothorax. Chronic bronchitic markings. Perihilar vascular fullness without overt edema. Bibasilar platelike opacities. IMPRESSION: 1. Bibasilar platelike opacities, favored to reflect atelectasis. Favored trace bilateral pleural effusions. 2. Changes compatible with chronic bronchitis. Electronically Signed   By: Valentino Saxon M.D.   On: 10/21/2022 07:51    Scheduled Meds:  allopurinol  100 mg Oral QHS   apixaban  5 mg Oral BID   atorvastatin  40 mg Oral QHS   carvedilol  12.5 mg Oral BID WC   doxycycline  100 mg Oral Q12H   ferrous sulfate  325 mg Oral Q breakfast   furosemide  60 mg Intravenous Daily   guaiFENesin  1,200 mg Oral BID   insulin aspart  0-9 Units Subcutaneous TID WC   ipratropium-albuterol  3 mL Nebulization TID   irbesartan  75 mg Oral Daily   methylPREDNISolone (SOLU-MEDROL) injection  60 mg Intravenous Q12H   multivitamin with minerals  1 tablet Oral Daily   pantoprazole  40 mg Oral BID AC   Continuous Infusions:   LOS: 2 days   Time spent: 35 mins  Jaqualin Serpa Wynetta Emery, MD How to contact the [facility] Attending or Consulting provider [place] or covering provider during after hours [place], for this patient?  Check the care team in [facility] and look for a) attending/consulting TRH provider listed and b) the [facility] team listed Log into [email or web address] and use [place]'s universal password to access. If you do not have the password, please contact the hospital operator. Locate the [facility] provider you are looking for under [facility] and page to a number that you can be directly reached. If you still have  difficulty reaching the provider, please page the [facility] (Director on Call) for the Hospitalists listed on amion for assistance.  10/22/2022, 11:53 AM

## 2022-10-23 ENCOUNTER — Inpatient Hospital Stay (HOSPITAL_COMMUNITY): Payer: Medicare HMO

## 2022-10-23 DIAGNOSIS — J449 Chronic obstructive pulmonary disease, unspecified: Secondary | ICD-10-CM | POA: Diagnosis not present

## 2022-10-23 DIAGNOSIS — N184 Chronic kidney disease, stage 4 (severe): Secondary | ICD-10-CM | POA: Diagnosis not present

## 2022-10-23 DIAGNOSIS — E119 Type 2 diabetes mellitus without complications: Secondary | ICD-10-CM

## 2022-10-23 DIAGNOSIS — J441 Chronic obstructive pulmonary disease with (acute) exacerbation: Secondary | ICD-10-CM | POA: Diagnosis not present

## 2022-10-23 LAB — BLOOD GAS, ARTERIAL
Acid-Base Excess: 1.2 mmol/L (ref 0.0–2.0)
Acid-Base Excess: 2.7 mmol/L — ABNORMAL HIGH (ref 0.0–2.0)
Bicarbonate: 33.4 mmol/L — ABNORMAL HIGH (ref 20.0–28.0)
Bicarbonate: 33.6 mmol/L — ABNORMAL HIGH (ref 20.0–28.0)
Drawn by: 22766
Drawn by: 22766
FIO2: 30 %
O2 Saturation: 90.4 %
O2 Saturation: 85.9 %
Patient temperature: 37
Patient temperature: 36.7
pCO2 arterial: 98 mmHg (ref 32–48)
pCO2 arterial: 85 mmHg (ref 32–48)
pH, Arterial: 7.14 — CL (ref 7.35–7.45)
pH, Arterial: 7.2 — ABNORMAL LOW (ref 7.35–7.45)
pO2, Arterial: 61 mmHg — ABNORMAL LOW (ref 83–108)
pO2, Arterial: 51 mmHg — ABNORMAL LOW (ref 83–108)

## 2022-10-23 LAB — BASIC METABOLIC PANEL
Anion gap: 5 (ref 5–15)
BUN: 72 mg/dL — ABNORMAL HIGH (ref 8–23)
CO2: 32 mmol/L (ref 22–32)
Calcium: 8.2 mg/dL — ABNORMAL LOW (ref 8.9–10.3)
Chloride: 99 mmol/L (ref 98–111)
Creatinine, Ser: 3.7 mg/dL — ABNORMAL HIGH (ref 0.61–1.24)
GFR, Estimated: 16 mL/min — ABNORMAL LOW (ref >60)
Glucose, Bld: 152 mg/dL — ABNORMAL HIGH (ref 70–99)
Potassium: 5.3 mmol/L — ABNORMAL HIGH (ref 3.5–5.1)
Sodium: 136 mmol/L (ref 135–145)

## 2022-10-23 LAB — MRSA NEXT GEN BY PCR, NASAL: MRSA by PCR Next Gen: NOT DETECTED

## 2022-10-23 LAB — GLUCOSE, CAPILLARY
Glucose-Capillary: 169 mg/dL — ABNORMAL HIGH (ref 70–99)
Glucose-Capillary: 149 mg/dL — ABNORMAL HIGH (ref 70–99)
Glucose-Capillary: 149 mg/dL — ABNORMAL HIGH (ref 70–99)
Glucose-Capillary: 128 mg/dL — ABNORMAL HIGH (ref 70–99)

## 2022-10-23 MED ORDER — LACTATED RINGERS IV SOLN: INTRAVENOUS | Status: DC

## 2022-10-23 MED ORDER — CHLORHEXIDINE GLUCONATE CLOTH 2 % EX PADS
6.0000 | MEDICATED_PAD | Freq: Every day | CUTANEOUS | Status: DC
  Administered 2022-10-23 – 2022-10-28 (×6): 6 via TOPICAL

## 2022-10-23 MED ORDER — GUAIFENESIN-DM 100-10 MG/5ML PO SYRP
5.0000 mL | ORAL_SOLUTION | ORAL | Status: DC | PRN
  Administered 2022-10-23: 5 mL via ORAL
  Filled 2022-10-23: qty 5

## 2022-10-23 MED ORDER — IPRATROPIUM-ALBUTEROL 0.5-2.5 (3) MG/3ML IN SOLN: 3.0000 mL | RESPIRATORY_TRACT | Status: DC

## 2022-10-23 MED ORDER — IPRATROPIUM-ALBUTEROL 0.5-2.5 (3) MG/3ML IN SOLN
3.0000 mL | Freq: Four times a day (QID) | RESPIRATORY_TRACT | Status: DC
  Administered 2022-10-23 – 2022-10-26 (×14): 3 mL via RESPIRATORY_TRACT
  Filled 2022-10-23 (×14): qty 3

## 2022-10-23 NOTE — Progress Notes (Signed)
Patient is confused , on 7 liters , saturation 89

## 2022-10-23 NOTE — Progress Notes (Signed)
PROGRESS NOTE  Andrew Adams  PZW:[id number] DOB: [birth year] DOA: 10/20/2022 PCP: Lavella Lemons, PA   Chief Complaint  Patient presents with   Shortness of Breath   Level of care: Stepdown  Brief Admission History:  76 year old gentleman with stage IV CKD, hypertension, type 2 diabetes mellitus, pulmonary embolus on apixaban, OSA, diastolic heart failure presented to the emergency department complaining of progressive shortness of breath and increasing edema in bilateral lower extremities over the past week.  He has been coughing and wheezing frequently increasing in sputum production and malaise.  He has been using his home bronchodilators but does not seem to be able to control the symptoms.  He denies fever and chills.  He denies nausea vomiting and diarrhea.  He was noted in the ED to have 3+ pitting edema bilateral lower extremities and diffuse wheezing.  He was started on IV Lasix and treatment for acute COPD exacerbation.  Patient reports no chest pain symptoms.  Patient reports he may not have been taking his Lasix regularly in the last several weeks.  He is being admitted for further management.   Assessment and Plan:  COPD with acute exacerbation  - continue scheduled bronchodilators - doxycycline 100 mg BID - mucinex 1200 mg BID  - delsym cough syrup  - supplemental oxygen - RT consult for resp support  - flutter valve -continue IV solumedrol BID  -12/24: noted to be somnolent --->ABG ordered; pH 7.14, pCO2 98 -bipap started, transferred to stepdown ICU  Acute hypercarbic respiratory failure -pt has consistently refused to wear nightly CPAP -noted in AM 12/24 to be confused/somnolent; pCO2 98, pH 7.14 -started on bipap 12/24 with improvement in mentation   Acute HFpEF - treated with IV lasix; hold lasix today with bump in creatinine - not sure she is taking regularly at home - monitor weight, I/O, electrolytes - pt had recent TTE with last admission Oct 2023    Bilateral Lower Extremity Edema - treated with IV lasix with improvement - holding amlodipine 10 mg as this can cause severe LE edema - elevate legs  - TED hoses to BLEs    Essential hypertension  - resume home meds EXCEPT amlodipine - monitor BP closely and adjust treatment plan as needed   Stage IV CKD - he is followed by Dr. Theador Hawthorne (nephrologist) - he will need to follow up with Dr. Theador Hawthorne as an outpatient  - bump in creatinine; diuretic holiday today; recheck in AM   Type 2 Diabetes Mellitus - continue SSI coverage with CBG monitoring (renal doses)  CBG (last 3)  Recent Labs    10/23/22 0423 10/23/22 0736 10/23/22 1141  GLUCAP 169* 149* 149*   Acquired Thrombophilia - apixaban 5 mg BID has been resumed   Hyperkalemia - suspect hemolyzed sample as was a difficult stick due to edema right arm - recheck BMP in AM  PE - resumed home apixaban as above    Hyperlipidemia - resumed home atorvastatin daily    OSA -nightly CPAP refused by patient -pt claims he will start wearing CPAP tonight   DVT prophylaxis: apixaban Code Status: Full  Family Communication: wife  Disposition: Status is: Inpatient Remains inpatient appropriate because: IV treatments    Consultants:   Procedures:   Antimicrobials:    Subjective: Pt noted to be somnolent.      Objective: Vitals:   10/23/22 1516 10/23/22 1517 10/23/22 1518 10/23/22 1519  BP:      Pulse: 91 94 (!) 116 (!) 116  Resp:      Temp:      TempSrc:      SpO2: (!) 87% (!) 89% 93% 97%  Weight:      Height:        Intake/Output Summary (Last 24 hours) at 10/23/2022 1612 Last data filed at 10/23/2022 1500 Gross per 24 hour  Intake 240 ml  Output 500 ml  Net -260 ml   Filed Weights   10/21/22 0332 10/22/22 0500 10/23/22 0500  Weight: 103.2 kg 104.3 kg 104.7 kg   Examination:  General exam: somnolent.  Respiratory system: diffuse expiratory wheezing heard bilaterally.  Cardiovascular system: normal S1  & S2 heard. No JVD, murmurs, rubs, gallops or clicks. No pedal edema. Gastrointestinal system: Abdomen is nondistended, soft and nontender. No organomegaly or masses felt. Normal bowel sounds heard. Central nervous system: Alert and oriented. No focal neurological deficits. Extremities: Symmetric 5 x 5 power. Skin: No rashes, lesions or ulcers. Psychiatry: Judgement and insight appear poor. Mood & affect appropriate.   Data Reviewed: I have personally reviewed following labs and imaging studies  CBC: Recent Labs  Lab 10/20/22 0901 10/21/22 0312  WBC 4.2 3.9*  NEUTROABS 2.3  --   HGB 9.8* 10.1*  HCT 30.8* 32.6*  MCV 87.7 89.6  PLT 158 671    Basic Metabolic Panel: Recent Labs  Lab 10/20/22 0901 10/21/22 0312 10/23/22 0912  NA 138 136 136  K 3.8 4.7 5.3*  CL 98 97* 99  CO2 31 32 32  GLUCOSE 100* 147* 152*  BUN 20 28* 72*  CREATININE 2.29* 2.77* 3.70*  CALCIUM 8.3* 8.3* 8.2*  MG  --  2.0  --     CBG: Recent Labs  Lab 10/22/22 1611 10/22/22 2106 10/23/22 0423 10/23/22 0736 10/23/22 1141  GLUCAP 221* 177* 169* 149* 149*    Recent Results (from the past 240 hour(s))  Resp panel by RT-PCR (RSV, Flu A&B, Covid) Anterior Nasal Swab     Status: None   Collection Time: 10/20/22  9:08 AM   Specimen: Anterior Nasal Swab  Result Value Ref Range Status   SARS Coronavirus 2 by RT PCR NEGATIVE NEGATIVE Final    Comment: (NOTE) SARS-CoV-2 target nucleic acids are NOT DETECTED.  The SARS-CoV-2 RNA is generally detectable in upper respiratory specimens during the acute phase of infection. The lowest concentration of SARS-CoV-2 viral copies this assay can detect is 138 copies/mL. A negative result does not preclude SARS-Cov-2 infection and should not be used as the sole basis for treatment or other patient management decisions. A negative result may occur with  improper specimen collection/handling, submission of specimen other than nasopharyngeal swab, presence of viral  mutation(s) within the areas targeted by this assay, and inadequate number of viral copies(<138 copies/mL). A negative result must be combined with clinical observations, patient history, and epidemiological information. The expected result is Negative.  Fact Sheet for Patients:  EntrepreneurPulse.com.au  Fact Sheet for Healthcare Providers:  IncredibleEmployment.be  This test is no t yet approved or cleared by the [place] FDA and  has been authorized for detection and/or diagnosis of SARS-CoV-2 by FDA under an Emergency Use Authorization (EUA). This EUA will remain  in effect (meaning this test can be used) for the duration of the COVID-19 declaration under Section 564(b)(1) of the Act, 21 U.S.C.section 360bbb-3(b)(1), unless the authorization is terminated  or revoked sooner.       Influenza A by PCR NEGATIVE NEGATIVE Final   Influenza B by PCR NEGATIVE  NEGATIVE Final    Comment: (NOTE) The Xpert Xpress SARS-CoV-2/FLU/RSV plus assay is intended as an aid in the diagnosis of influenza from Nasopharyngeal swab specimens and should not be used as a sole basis for treatment. Nasal washings and aspirates are unacceptable for Xpert Xpress SARS-CoV-2/FLU/RSV testing.  Fact Sheet for Patients: EntrepreneurPulse.com.au  Fact Sheet for Healthcare Providers: IncredibleEmployment.be  This test is not yet approved or cleared by the [place] FDA and has been authorized for detection and/or diagnosis of SARS-CoV-2 by FDA under an Emergency Use Authorization (EUA). This EUA will remain in effect (meaning this test can be used) for the duration of the COVID-19 declaration under Section 564(b)(1) of the Act, 21 U.S.C. section 360bbb-3(b)(1), unless the authorization is terminated or revoked.     Resp Syncytial Virus by PCR NEGATIVE NEGATIVE Final    Comment: (NOTE) Fact Sheet for  Patients: EntrepreneurPulse.com.au  Fact Sheet for Healthcare Providers: IncredibleEmployment.be  This test is not yet approved or cleared by the [place] FDA and has been authorized for detection and/or diagnosis of SARS-CoV-2 by FDA under an Emergency Use Authorization (EUA). This EUA will remain in effect (meaning this test can be used) for the duration of the COVID-19 declaration under Section 564(b)(1) of the Act, 21 U.S.C. section 360bbb-3(b)(1), unless the authorization is terminated or revoked.  Performed at [facility], [address] [place], [place] [address]   MRSA Next Gen by PCR, Nasal     Status: None   Collection Time: 10/23/22 10:15 AM   Specimen: Nasal Mucosa; Nasal Swab  Result Value Ref Range Status   MRSA by PCR Next Gen NOT DETECTED NOT DETECTED Final    Comment: (NOTE) The GeneXpert MRSA Assay (FDA approved for NASAL specimens only), is one component of a comprehensive MRSA colonization surveillance program. It is not intended to diagnose MRSA infection nor to guide or monitor treatment for MRSA infections. Test performance is not FDA approved in patients less than 32 years old. Performed at [facility], [address] [place], [place] [address]      Radiology Studies: DG CHEST PORT 1 VIEW  Result Date: 10/23/2022 CLINICAL DATA:  COPD EXAM: PORTABLE CHEST 1 VIEW COMPARISON:  Two days ago FINDINGS: Cardiomegaly and vascular pedicle widening. Band of atelectasis along the right minor fissure, lung volumes are low. Diffuse interstitial prominence. No focal consolidation, effusion, or pneumothorax. IMPRESSION: 1. Cardiomegaly and vascular congestion. 2. Worsening lung volumes and increased atelectasis. Electronically Signed   By: Jorje Guild M.D.   On: 10/23/2022 09:25    Scheduled Meds:  allopurinol  100 mg Oral QHS   apixaban  5 mg Oral BID   atorvastatin  40 mg Oral QHS   carvedilol  12.5 mg Oral BID WC    Chlorhexidine Gluconate Cloth  6 each Topical Q0600   doxycycline  100 mg Oral Q12H   ferrous sulfate  325 mg Oral Q breakfast   guaiFENesin  1,200 mg Oral BID   insulin aspart  0-9 Units Subcutaneous TID WC   ipratropium-albuterol  3 mL Nebulization Q6H   irbesartan  75 mg Oral Daily   methylPREDNISolone (SOLU-MEDROL) injection  60 mg Intravenous Q12H   multivitamin with minerals  1 tablet Oral Daily   pantoprazole  40 mg Oral BID AC   Continuous Infusions:  lactated ringers Stopped (10/23/22 1500)     LOS: 3 days   Critical Care Procedure Note Authorized and Performed by: Murvin Natal MD  Total Critical Care time:  55 mins Due to a high probability of clinically significant, life threatening deterioration, the patient required my highest level of preparedness to intervene emergently and I personally spent this critical care time directly and personally managing the patient.  This critical care time included obtaining a history; examining the patient, pulse oximetry; ordering and review of studies; arranging urgent treatment with development of a management plan; evaluation of patient's response of treatment; frequent reassessment; and discussions with other providers.  This critical care time was performed to assess and manage the high probability of imminent and life threatening deterioration that could result in multi-organ failure.  It was exclusive of separately billable procedures and treating other patients and teaching time.    Irwin Brakeman, MD How to contact the [facility] Attending or Consulting provider [place] or covering provider during after hours [place], for this patient?  Check the care team in [facility] and look for a) attending/consulting TRH provider listed and b) the [facility] team listed Log into [email or web address] and use [place]'s universal password to access. If you do not have the password, please contact the hospital operator. Locate the [facility] provider you are looking for under  [facility] and page to a number that you can be directly reached. If you still have difficulty reaching the provider, please page the [facility] (Director on Call) for the Hospitalists listed on amion for assistance.  10/23/2022, 4:12 PM

## 2022-10-23 NOTE — Progress Notes (Signed)
Patient slept throughout the night, He does not complain of any pain or discomfort at this time.

## 2022-10-23 NOTE — Progress Notes (Signed)
Patient's wife is in and at the bedside. Patient is more calm and redirectable allowing staff to provide care. Patient accepted medication inculding insulin. Patient has agree to have the IV replaced. Patient is now eating with the help from his wife. Per the wife, patient is supposed to go back on the BiPAP at 11 pm tonight. RN educated that patient really needs to be on the BiPAP now as ordered by provider. Patient is eating now and agree to put BiPAP on 30-45 mins after dinner is done. Safety maintained. Call light within reach. Denied pain or discomfort.

## 2022-10-23 NOTE — Progress Notes (Signed)
Went to give patient morning medications, noted patient had increased restlessness patient would awaken for a few seconds then go back to sleep. RT and MD Wynetta Emery at bedside. RT obtaining ABG. Informed MD patient was more alert this morning ambulated self to bathroom and noted edema to patients right arm.

## 2022-10-23 NOTE — Progress Notes (Signed)
Date and time results received: 10/23/22 (use smartphrase ".now" to insert current time)  Test: ABG Critical Value: Critical ABG: pH7.2, CO2 85, O2 51. Which are all improvements  Name of Provider Notified: Dr. Wynetta Emery  Orders Received? Or Actions Taken?: No new orders

## 2022-10-23 NOTE — Progress Notes (Signed)
Lab called critical lab patients pH 7.14, pCO2 98. MD Wynetta Emery made aware. New orders placed. Patient transferred to ICU.

## 2022-10-23 NOTE — Progress Notes (Signed)
Patient arrived to unit A&Ox4. Bedside report received from [facility] LPN. According to [facility], patient ate minimal amount of breakfast. Patient oriented to staff and unit and equipment. Safety precautions in place. RT and placed BiPAP at this time. Call light within reach. Safety education provided. Plan of care reviewed with patient and they agree. Right arm noted with some swelling and per Eustace Pen MD is aware. IV in that right arm noted with sluggish blood return. CN is aware. Will continue to monitor and endorse.
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
## 2022-10-24 ENCOUNTER — Other Ambulatory Visit: Payer: Self-pay

## 2022-10-24 DIAGNOSIS — N184 Chronic kidney disease, stage 4 (severe): Secondary | ICD-10-CM | POA: Diagnosis not present

## 2022-10-24 DIAGNOSIS — E119 Type 2 diabetes mellitus without complications: Secondary | ICD-10-CM | POA: Diagnosis not present

## 2022-10-24 DIAGNOSIS — D631 Anemia in chronic kidney disease: Secondary | ICD-10-CM | POA: Diagnosis not present

## 2022-10-24 DIAGNOSIS — J441 Chronic obstructive pulmonary disease with (acute) exacerbation: Secondary | ICD-10-CM | POA: Diagnosis not present

## 2022-10-24 LAB — BASIC METABOLIC PANEL
Anion gap: 9 (ref 5–15)
BUN: 77 mg/dL — ABNORMAL HIGH (ref 8–23)
CO2: 27 mmol/L (ref 22–32)
Calcium: 8 mg/dL — ABNORMAL LOW (ref 8.9–10.3)
Chloride: 100 mmol/L (ref 98–111)
Creatinine, Ser: 3.28 mg/dL — ABNORMAL HIGH (ref 0.61–1.24)
GFR, Estimated: 19 mL/min — ABNORMAL LOW (ref >60)
Glucose, Bld: 155 mg/dL — ABNORMAL HIGH (ref 70–99)
Potassium: 5 mmol/L (ref 3.5–5.1)
Sodium: 136 mmol/L (ref 135–145)

## 2022-10-24 LAB — GLUCOSE, CAPILLARY
Glucose-Capillary: 148 mg/dL — ABNORMAL HIGH (ref 70–99)
Glucose-Capillary: 136 mg/dL — ABNORMAL HIGH (ref 70–99)
Glucose-Capillary: 130 mg/dL — ABNORMAL HIGH (ref 70–99)
Glucose-Capillary: 166 mg/dL — ABNORMAL HIGH (ref 70–99)
Glucose-Capillary: 142 mg/dL — ABNORMAL HIGH (ref 70–99)

## 2022-10-24 LAB — CBC
HCT: 33.7 % — ABNORMAL LOW (ref 39.0–52.0)
Hemoglobin: 10.5 g/dL — ABNORMAL LOW (ref 13.0–17.0)
MCH: 27.9 pg (ref 26.0–34.0)
MCHC: 31.2 g/dL (ref 30.0–36.0)
MCV: 89.6 fL (ref 80.0–100.0)
Platelets: 152 10*3/uL (ref 150–400)
RBC: 3.76 MIL/uL — ABNORMAL LOW (ref 4.22–5.81)
RDW: 13.3 % (ref 11.5–15.5)
WBC: 6 10*3/uL (ref 4.0–10.5)
nRBC: 0 % (ref 0.0–0.2)

## 2022-10-24 LAB — MAGNESIUM: Magnesium: 2.4 mg/dL (ref 1.7–2.4)

## 2022-10-24 MED ORDER — METOPROLOL TARTRATE 5 MG/5ML IV SOLN
5.0000 mg | Freq: Once | INTRAVENOUS | Status: AC
  Administered 2022-10-24: 5 mg via INTRAVENOUS
  Filled 2022-10-24: qty 5

## 2022-10-24 NOTE — Progress Notes (Signed)
On BIPAP placed by NURSE.

## 2022-10-24 NOTE — Plan of Care (Signed)
Problem: Education: Goal: Ability to demonstrate management of disease process will improve Outcome: Progressing Goal: Ability to verbalize understanding of medication therapies will improve Outcome: Progressing Goal: Individualized Educational Video(s) Outcome: Progressing   Problem: Activity: Goal: Capacity to carry out activities will improve Outcome: Progressing   Problem: Cardiac: Goal: Ability to achieve and maintain adequate cardiopulmonary perfusion will improve Outcome: Progressing   Problem: Education: Goal: Knowledge of disease or condition will improve Outcome: Progressing Goal: Knowledge of the prescribed therapeutic regimen will improve Outcome: Progressing Goal: Individualized Educational Video(s) Outcome: Progressing   Problem: Activity: Goal: Ability to tolerate increased activity will improve Outcome: Progressing Goal: Will verbalize the importance of balancing activity with adequate rest periods Outcome: Progressing   Problem: Respiratory: Goal: Ability to maintain a clear airway will improve Outcome: Progressing Goal: Levels of oxygenation will improve Outcome: Progressing Goal: Ability to maintain adequate ventilation will improve Outcome: Progressing   Problem: Education: Goal: Ability to describe self-care measures that may prevent or decrease complications (Diabetes Survival Skills Education) will improve Outcome: Progressing Goal: Individualized Educational Video(s) Outcome: Progressing   Problem: Coping: Goal: Ability to adjust to condition or change in health will improve Outcome: Progressing   Problem: Fluid Volume: Goal: Ability to maintain a balanced intake and output will improve Outcome: Progressing   Problem: Health Behavior/Discharge Planning: Goal: Ability to identify and utilize available resources and services will improve Outcome: Progressing Goal: Ability to manage health-related needs will improve Outcome: Progressing    Problem: Metabolic: Goal: Ability to maintain appropriate glucose levels will improve Outcome: Progressing   Problem: Nutritional: Goal: Maintenance of adequate nutrition will improve Outcome: Progressing Goal: Progress toward achieving an optimal weight will improve Outcome: Progressing   Problem: Skin Integrity: Goal: Risk for impaired skin integrity will decrease Outcome: Progressing   Problem: Tissue Perfusion: Goal: Adequacy of tissue perfusion will improve Outcome: Progressing   Problem: Education: Goal: Knowledge of General Education information will improve Description: Including pain rating scale, medication(s)/side effects and non-pharmacologic comfort measures Outcome: Progressing   Problem: Health Behavior/Discharge Planning: Goal: Ability to manage health-related needs will improve Outcome: Progressing   Problem: Clinical Measurements: Goal: Ability to maintain clinical measurements within normal limits will improve Outcome: Progressing Goal: Will remain free from infection Outcome: Progressing Goal: Diagnostic test results will improve Outcome: Progressing Goal: Respiratory complications will improve Outcome: Progressing Goal: Cardiovascular complication will be avoided Outcome: Progressing   Problem: Activity: Goal: Risk for activity intolerance will decrease Outcome: Progressing   Problem: Nutrition: Goal: Adequate nutrition will be maintained Outcome: Progressing   Problem: Coping: Goal: Level of anxiety will decrease Outcome: Progressing   Problem: Elimination: Goal: Will not experience complications related to bowel motility Outcome: Progressing Goal: Will not experience complications related to urinary retention Outcome: Progressing   Problem: Pain Managment: Goal: General experience of comfort will improve Outcome: Progressing   Problem: Safety: Goal: Ability to remain free from injury will improve Outcome: Progressing   Problem:  Skin Integrity: Goal: Risk for impaired skin integrity will decrease Outcome: Progressing   Problem: Education: Goal: Knowledge of General Education information will improve Description: Including pain rating scale, medication(s)/side effects and non-pharmacologic comfort measures Outcome: Progressing   Problem: Health Behavior/Discharge Planning: Goal: Ability to manage health-related needs will improve Outcome: Progressing   Problem: Clinical Measurements: Goal: Ability to maintain clinical measurements within normal limits will improve Outcome: Progressing Goal: Will remain free from infection Outcome: Progressing Goal: Diagnostic test results will improve Outcome: Progressing Goal: Respiratory complications will improve Outcome: Progressing  Goal: Cardiovascular complication will be avoided Outcome: Progressing   Problem: Activity: Goal: Risk for activity intolerance will decrease Outcome: Progressing

## 2022-10-24 NOTE — Progress Notes (Signed)
PROGRESS NOTE  Andrew Adams  BPZ:[id number] DOB: 1946/03/16 DOA: 10/20/2022 PCP: Lavella Lemons, PA   Chief Complaint  Patient presents with   Shortness of Breath   Level of care: Stepdown  Brief Admission History:  76 year old gentleman with stage IV CKD, hypertension, type 2 diabetes mellitus, pulmonary embolus on apixaban, OSA, diastolic heart failure presented to the emergency department complaining of progressive shortness of breath and increasing edema in bilateral lower extremities over the past week.  He has been coughing and wheezing frequently increasing in sputum production and malaise.  He has been using his home bronchodilators but does not seem to be able to control the symptoms.  He denies fever and chills.  He denies nausea vomiting and diarrhea.  He was noted in the ED to have 3+ pitting edema bilateral lower extremities and diffuse wheezing.  He was started on IV Lasix and treatment for acute COPD exacerbation.  Patient reports no chest pain symptoms.  Patient reports he may not have been taking his Lasix regularly in the last several weeks.  He is being admitted for further management.   Assessment and Plan:  COPD with acute exacerbation  - continue scheduled bronchodilators - doxycycline 100 mg BID - mucinex 1200 mg BID  - delsym cough syrup  - supplemental oxygen - RT consult for resp support  - flutter valve -continue IV solumedrol BID  -12/24: noted to be somnolent --->ABG ordered; pH 7.14, pCO2 98 -bipap started, transferred to stepdown ICU  Acute hypercarbic respiratory failure -pt has consistently refused to wear nightly CPAP -noted in AM 12/24 to be confused/somnolent; pCO2 98, pH 7.14 -started on bipap 12/24 with improvement in mentation   Acute HFpEF - treated with IV lasix; hold lasix today with bump in creatinine - not sure she is taking regularly at home - monitor weight, I/O, electrolytes - pt had recent TTE with last admission Oct 2023    Bilateral Lower Extremity Edema - treated with IV lasix with improvement - holding amlodipine 10 mg as this can cause severe LE edema - elevate legs  - TED hoses to BLEs    Essential hypertension  - resume home meds EXCEPT amlodipine - monitor BP closely and adjust treatment plan as needed   AKI on Stage IV CKD - he is followed by Dr. Theador Hawthorne (nephrologist) - he will need to follow up with Dr. Theador Hawthorne as an outpatient  - bump in creatinine; diuretic holiday;  recheck in AM   Type 2 Diabetes Mellitus - continue SSI coverage with CBG monitoring (renal doses)  CBG (last 3)  Recent Labs    10/24/22 0305 10/24/22 0719 10/24/22 1107  GLUCAP 148* 136* 130*   Acquired Thrombophilia - apixaban 5 mg BID has been resumed   Hyperkalemia - suspect hemolyzed sample as was a difficult stick due to edema right arm - recheck BMP in AM  PE - resumed home apixaban as above    Hyperlipidemia - resumed home atorvastatin daily    OSA -nightly CPAP refused by patient -pt claims he will start wearing CPAP tonight   DVT prophylaxis: apixaban Code Status: Full  Family Communication: wife bedside 12/24 Disposition: Status is: Inpatient Remains inpatient appropriate because: IV treatments    Consultants:   Procedures:   Antimicrobials:    Subjective: Pt was able to wear bipap last night, he is still a little slow this morning but able to mentate better.       Objective: Vitals:   10/24/22 2423  10/24/22 0900 10/24/22 1000 10/24/22 1109  BP:  (!) 176/113 (!) 168/102   Pulse:  (!) 120 86   Resp:  (!) 21 (!) 22   Temp: 97.9 F (36.6 C)   97.7 F (36.5 C)  TempSrc: Axillary   Axillary  SpO2:  97% 92%   Weight:      Height:        Intake/Output Summary (Last 24 hours) at 10/24/2022 1128 Last data filed at 10/24/2022 1109 Gross per 24 hour  Intake 240 ml  Output 1945 ml  Net -1705 ml   Filed Weights   10/23/22 0500 10/23/22 2337 10/24/22 0500  Weight: 104.7 kg 105.5 kg  105.5 kg   Examination:  General exam: somnolent but arousable.  Respiratory system: diffuse expiratory wheezing heard bilaterally.  Cardiovascular system: normal S1 & S2 heard. No JVD, murmurs, rubs, gallops or clicks. No pedal edema. Gastrointestinal system: Abdomen is nondistended, soft and nontender. No organomegaly or masses felt. Normal bowel sounds heard. Central nervous system: Alert and oriented. No focal neurological deficits. Extremities: Symmetric 5 x 5 power. Skin: No rashes, lesions or ulcers. Psychiatry: Judgement and insight appear poor. Mood & affect appropriate.   Data Reviewed: I have personally reviewed following labs and imaging studies  CBC: Recent Labs  Lab 10/20/22 0901 10/21/22 0312 10/24/22 0358  WBC 4.2 3.9* 6.0  NEUTROABS 2.3  --   --   HGB 9.8* 10.1* 10.5*  HCT 30.8* 32.6* 33.7*  MCV 87.7 89.6 89.6  PLT 158 182 944    Basic Metabolic Panel: Recent Labs  Lab 10/20/22 0901 10/21/22 0312 10/23/22 0912 10/24/22 0358  NA 138 136 136 136  K 3.8 4.7 5.3* 5.0  CL 98 97* 99 100  CO2 31 32 32 27  GLUCOSE 100* 147* 152* 155*  BUN 20 28* 72* 77*  CREATININE 2.29* 2.77* 3.70* 3.28*  CALCIUM 8.3* 8.3* 8.2* 8.0*  MG  --  2.0  --  2.4    CBG: Recent Labs  Lab 10/23/22 1141 10/23/22 1700 10/24/22 0305 10/24/22 0719 10/24/22 1107  GLUCAP 149* 128* 148* 136* 130*    Recent Results (from the past 240 hour(s))  Resp panel by RT-PCR (RSV, Flu A&B, Covid) Anterior Nasal Swab     Status: None   Collection Time: 10/20/22  9:08 AM   Specimen: Anterior Nasal Swab  Result Value Ref Range Status   SARS Coronavirus 2 by RT PCR NEGATIVE NEGATIVE Final    Comment: (NOTE) SARS-CoV-2 target nucleic acids are NOT DETECTED.  The SARS-CoV-2 RNA is generally detectable in upper respiratory specimens during the acute phase of infection. The lowest concentration of SARS-CoV-2 viral copies this assay can detect is 138 copies/mL. A negative result does not  preclude SARS-Cov-2 infection and should not be used as the sole basis for treatment or other patient management decisions. A negative result may occur with  improper specimen collection/handling, submission of specimen other than nasopharyngeal swab, presence of viral mutation(s) within the areas targeted by this assay, and inadequate number of viral copies(<138 copies/mL). A negative result must be combined with clinical observations, patient history, and epidemiological information. The expected result is Negative.  Fact Sheet for Patients:  EntrepreneurPulse.com.au  Fact Sheet for Healthcare Providers:  IncredibleEmployment.be  This test is no t yet approved or cleared by the [place] FDA and  has been authorized for detection and/or diagnosis of SARS-CoV-2 by FDA under an Emergency Use Authorization (EUA). This EUA will remain  in effect (  meaning this test can be used) for the duration of the COVID-19 declaration under Section 564(b)(1) of the Act, 21 U.S.C.section 360bbb-3(b)(1), unless the authorization is terminated  or revoked sooner.       Influenza A by PCR NEGATIVE NEGATIVE Final   Influenza B by PCR NEGATIVE NEGATIVE Final    Comment: (NOTE) The Xpert Xpress SARS-CoV-2/FLU/RSV plus assay is intended as an aid in the diagnosis of influenza from Nasopharyngeal swab specimens and should not be used as a sole basis for treatment. Nasal washings and aspirates are unacceptable for Xpert Xpress SARS-CoV-2/FLU/RSV testing.  Fact Sheet for Patients: EntrepreneurPulse.com.au  Fact Sheet for Healthcare Providers: IncredibleEmployment.be  This test is not yet approved or cleared by the [place] FDA and has been authorized for detection and/or diagnosis of SARS-CoV-2 by FDA under an Emergency Use Authorization (EUA). This EUA will remain in effect (meaning this test can be used) for the  duration of the COVID-19 declaration under Section 564(b)(1) of the Act, 21 U.S.C. section 360bbb-3(b)(1), unless the authorization is terminated or revoked.     Resp Syncytial Virus by PCR NEGATIVE NEGATIVE Final    Comment: (NOTE) Fact Sheet for Patients: EntrepreneurPulse.com.au  Fact Sheet for Healthcare Providers: IncredibleEmployment.be  This test is not yet approved or cleared by the [place] FDA and has been authorized for detection and/or diagnosis of SARS-CoV-2 by FDA under an Emergency Use Authorization (EUA). This EUA will remain in effect (meaning this test can be used) for the duration of the COVID-19 declaration under Section 564(b)(1) of the Act, 21 U.S.C. section 360bbb-3(b)(1), unless the authorization is terminated or revoked.  Performed at [facility], [address], [place], [place] [address]   MRSA Next Gen by PCR, Nasal     Status: None   Collection Time: 10/23/22 10:15 AM   Specimen: Nasal Mucosa; Nasal Swab  Result Value Ref Range Status   MRSA by PCR Next Gen NOT DETECTED NOT DETECTED Final    Comment: (NOTE) The GeneXpert MRSA Assay (FDA approved for NASAL specimens only), is one component of a comprehensive MRSA colonization surveillance program. It is not intended to diagnose MRSA infection nor to guide or monitor treatment for MRSA infections. Test performance is not FDA approved in patients less than 3 years old. Performed at [facility], [address], [place], [place] [address]      Radiology Studies: DG CHEST PORT 1 VIEW  Result Date: 10/23/2022 CLINICAL DATA:  COPD EXAM: PORTABLE CHEST 1 VIEW COMPARISON:  Two days ago FINDINGS: Cardiomegaly and vascular pedicle widening. Band of atelectasis along the right minor fissure, lung volumes are low. Diffuse interstitial prominence. No focal consolidation, effusion, or pneumothorax. IMPRESSION: 1. Cardiomegaly and vascular congestion. 2. Worsening lung  volumes and increased atelectasis. Electronically Signed   By: Jorje Guild M.D.   On: 10/23/2022 09:25    Scheduled Meds:  allopurinol  100 mg Oral QHS   apixaban  5 mg Oral BID   atorvastatin  40 mg Oral QHS   carvedilol  12.5 mg Oral BID WC   Chlorhexidine Gluconate Cloth  6 each Topical Q0600   doxycycline  100 mg Oral Q12H   ferrous sulfate  325 mg Oral Q breakfast   insulin aspart  0-9 Units Subcutaneous TID WC   ipratropium-albuterol  3 mL Nebulization Q6H   irbesartan  75 mg Oral Daily   methylPREDNISolone (SOLU-MEDROL) injection  60 mg Intravenous Q12H   multivitamin with minerals  1 tablet Oral Daily  pantoprazole  40 mg Oral BID AC   Continuous Infusions:   LOS: 4 days   Critical Care Procedure Note Authorized and Performed by: Murvin Natal MD  Total Critical Care time:  38 mins Due to a high probability of clinically significant, life threatening deterioration, the patient required my highest level of preparedness to intervene emergently and I personally spent this critical care time directly and personally managing the patient.  This critical care time included obtaining a history; examining the patient, pulse oximetry; ordering and review of studies; arranging urgent treatment with development of a management plan; evaluation of patient's response of treatment; frequent reassessment; and discussions with other providers.  This critical care time was performed to assess and manage the high probability of imminent and life threatening deterioration that could result in multi-organ failure.  It was exclusive of separately billable procedures and treating other patients and teaching time.    Irwin Brakeman, MD How to contact the [facility] Attending or Consulting provider [place] or covering provider during after hours [place], for this patient?  Check the care team in [facility] and look for a) attending/consulting [facility] provider listed and b) the [facility] team listed Log into [email or web address]  and use [place]'s universal password to access. If you do not have the password, please contact the hospital operator. Locate the [facility] provider you are looking for under [facility] and page to a number that you can be directly reached. If you still have difficulty reaching the provider, please page the [facility] (Director on Call) for the Hospitalists listed on amion for assistance.  10/24/2022, 11:28 AM

## 2022-10-25 DIAGNOSIS — R531 Weakness: Secondary | ICD-10-CM | POA: Diagnosis not present

## 2022-10-25 DIAGNOSIS — I5032 Chronic diastolic (congestive) heart failure: Secondary | ICD-10-CM | POA: Diagnosis not present

## 2022-10-25 DIAGNOSIS — G4733 Obstructive sleep apnea (adult) (pediatric): Secondary | ICD-10-CM | POA: Diagnosis not present

## 2022-10-25 DIAGNOSIS — J441 Chronic obstructive pulmonary disease with (acute) exacerbation: Secondary | ICD-10-CM | POA: Diagnosis not present

## 2022-10-25 LAB — RENAL FUNCTION PANEL
Albumin: 3.4 g/dL — ABNORMAL LOW (ref 3.5–5.0)
Anion gap: 7 (ref 5–15)
BUN: 88 mg/dL — ABNORMAL HIGH (ref 8–23)
CO2: 30 mmol/L (ref 22–32)
Calcium: 8.5 mg/dL — ABNORMAL LOW (ref 8.9–10.3)
Chloride: 105 mmol/L (ref 98–111)
Creatinine, Ser: 2.82 mg/dL — ABNORMAL HIGH (ref 0.61–1.24)
GFR, Estimated: 22 mL/min — ABNORMAL LOW (ref >60)
Glucose, Bld: 158 mg/dL — ABNORMAL HIGH (ref 70–99)
Phosphorus: 4.4 mg/dL (ref 2.5–4.6)
Potassium: 4.8 mmol/L (ref 3.5–5.1)
Sodium: 142 mmol/L (ref 135–145)

## 2022-10-25 LAB — GLUCOSE, CAPILLARY
Glucose-Capillary: 157 mg/dL — ABNORMAL HIGH (ref 70–99)
Glucose-Capillary: 157 mg/dL — ABNORMAL HIGH (ref 70–99)
Glucose-Capillary: 157 mg/dL — ABNORMAL HIGH (ref 70–99)
Glucose-Capillary: 192 mg/dL — ABNORMAL HIGH (ref 70–99)

## 2022-10-25 MED ORDER — LABETALOL HCL 5 MG/ML IV SOLN
10.0000 mg | INTRAVENOUS | Status: DC | PRN
  Administered 2022-10-25 – 2022-10-27 (×5): 10 mg via INTRAVENOUS
  Filled 2022-10-25 (×5): qty 4

## 2022-10-25 MED ORDER — METHYLPREDNISOLONE SODIUM SUCC 40 MG IJ SOLR
40.0000 mg | Freq: Two times a day (BID) | INTRAMUSCULAR | Status: DC
  Administered 2022-10-25 – 2022-10-28 (×6): 40 mg via INTRAVENOUS
  Filled 2022-10-25 (×6): qty 1

## 2022-10-25 MED ORDER — LABETALOL HCL 5 MG/ML IV SOLN
10.0000 mg | Freq: Once | INTRAVENOUS | Status: AC
  Administered 2022-10-25: 10 mg via INTRAVENOUS
  Filled 2022-10-25: qty 4

## 2022-10-25 MED ORDER — METOPROLOL TARTRATE 5 MG/5ML IV SOLN
2.5000 mg | Freq: Once | INTRAVENOUS | Status: AC
  Administered 2022-10-25: 2.5 mg via INTRAVENOUS
  Filled 2022-10-25: qty 5

## 2022-10-25 NOTE — Progress Notes (Signed)
PROGRESS NOTE  Andrew Adams  OMV:[id number] DOB: [birth year] DOA: 10/20/2022 PCP: Lavella Lemons, PA   Chief Complaint  Patient presents with   Shortness of Breath   Level of care: Stepdown  Brief Admission History:  76 year old gentleman with stage IV CKD, hypertension, type 2 diabetes mellitus, pulmonary embolus on apixaban, OSA, diastolic heart failure presented to the emergency department complaining of progressive shortness of breath and increasing edema in bilateral lower extremities over the past week.  He has been coughing and wheezing frequently increasing in sputum production and malaise.  He has been using his home bronchodilators but does not seem to be able to control the symptoms.  He denies fever and chills.  He denies nausea vomiting and diarrhea.  He was noted in the ED to have 3+ pitting edema bilateral lower extremities and diffuse wheezing.  He was started on IV Lasix and treatment for acute COPD exacerbation.  Patient reports no chest pain symptoms.  Patient reports he may not have been taking his Lasix regularly in the last several weeks.  He is being admitted for further management.   Assessment and Plan:  COPD with acute exacerbation  - continue scheduled bronchodilators - doxycycline 100 mg BID - mucinex 1200 mg BID  - delsym cough syrup  - supplemental oxygen - RT consult for resp support  - flutter valve -continue IV solumedrol BID  -12/24: noted to be somnolent --->ABG ordered; pH 7.14, pCO2 98 -bipap started, transferred to stepdown ICU  Acute hypercarbic respiratory failure -pt has consistently refused to wear nightly CPAP -noted in AM 12/24 to be confused/somnolent; pCO2 98, pH 7.14 -started on bipap 12/24 with improvement in mentation -continue nightly bipap    Acute HFpEF - treated with IV lasix; hold lasix today with bump in creatinine - not sure she is taking regularly at home - monitor weight, I/O, electrolytes - pt had recent TTE with last  admission Oct 2023   Bilateral Lower Extremity Edema - treated with IV lasix with improvement - holding amlodipine 10 mg as this can cause severe LE edema - elevate legs  - TED hoses to BLEs  - improving    Essential hypertension  - resume home meds EXCEPT amlodipine - monitor BP closely and adjust treatment plan as needed   AKI on Stage IV CKD - he is followed by Dr. Theador Hawthorne (nephrologist) - he will need to follow up with Dr. Theador Hawthorne as an outpatient  - bump in creatinine; diuretic holiday;  recheck in AM   Type 2 Diabetes Mellitus - continue SSI coverage with CBG monitoring (renal doses)  CBG (last 3)  Recent Labs    10/24/22 2126 10/25/22 0438 10/25/22 0732  GLUCAP 142* 157* 157*   Acquired Thrombophilia - apixaban 5 mg BID has been resumed   Hyperkalemia - suspect hemolyzed sample as was a difficult stick due to edema right arm - resolved by next BMP  PE - resumed home apixaban as above    Hyperlipidemia - resumed home atorvastatin daily    OSA -nightly CPAP refused by patient -pt claims he will start wearing CPAP tonight   DVT prophylaxis: apixaban Code Status: Full  Family Communication: wife bedside 12/24 Disposition: Status is: Inpatient Remains inpatient appropriate because: IV treatments    Consultants:   Procedures:   Antimicrobials:    Subjective: Pt on bipap this morning and not able to speak; seemed somnolent.       Objective: Vitals:   10/25/22 1030 10/25/22 1100  10/25/22 1130 10/25/22 1200  BP:      Pulse: 90 91 93 94  Resp: (!) 21 (!) 21 (!) 22 (!) 24  Temp:      TempSrc:      SpO2: 95% 97% 97% 93%  Weight:      Height:        Intake/Output Summary (Last 24 hours) at 10/25/2022 1348 Last data filed at 10/25/2022 1130 Gross per 24 hour  Intake 240 ml  Output 2300 ml  Net -2060 ml   Filed Weights   10/23/22 2337 10/24/22 0500 10/25/22 0529  Weight: 105.5 kg 105.5 kg 101.3 kg   Examination:  General exam: somnolent  but arousable.  Respiratory system: diffuse expiratory wheezing heard bilaterally. Poor amount of air movement even on bipap.  Cardiovascular system: normal S1 & S2 heard. No JVD, murmurs, rubs, gallops or clicks. No pedal edema. Gastrointestinal system: Abdomen is nondistended, soft and nontender. No organomegaly or masses felt. Normal bowel sounds heard. Central nervous system: Alert and oriented. No focal neurological deficits. Extremities: Symmetric 5 x 5 power. Skin: No rashes, lesions or ulcers. Psychiatry: Judgement and insight appear poor. Mood & affect appropriate.   Data Reviewed: I have personally reviewed following labs and imaging studies  CBC: Recent Labs  Lab 10/20/22 0901 10/21/22 0312 10/24/22 0358  WBC 4.2 3.9* 6.0  NEUTROABS 2.3  --   --   HGB 9.8* 10.1* 10.5*  HCT 30.8* 32.6* 33.7*  MCV 87.7 89.6 89.6  PLT 158 182 333    Basic Metabolic Panel: Recent Labs  Lab 10/20/22 0901 10/21/22 0312 10/23/22 0912 10/24/22 0358 10/25/22 0334  NA 138 136 136 136 142  K 3.8 4.7 5.3* 5.0 4.8  CL 98 97* 99 100 105  CO2 31 32 32 27 30  GLUCOSE 100* 147* 152* 155* 158*  BUN 20 28* 72* 77* 88*  CREATININE 2.29* 2.77* 3.70* 3.28* 2.82*  CALCIUM 8.3* 8.3* 8.2* 8.0* 8.5*  MG  --  2.0  --  2.4  --   PHOS  --   --   --   --  4.4    CBG: Recent Labs  Lab 10/24/22 1107 10/24/22 1609 10/24/22 2126 10/25/22 0438 10/25/22 0732  GLUCAP 130* 166* 142* 157* 157*    Recent Results (from the past 240 hour(s))  Resp panel by RT-PCR (RSV, Flu A&B, Covid) Anterior Nasal Swab     Status: None   Collection Time: 10/20/22  9:08 AM   Specimen: Anterior Nasal Swab  Result Value Ref Range Status   SARS Coronavirus 2 by RT PCR NEGATIVE NEGATIVE Final    Comment: (NOTE) SARS-CoV-2 target nucleic acids are NOT DETECTED.  The SARS-CoV-2 RNA is generally detectable in upper respiratory specimens during the acute phase of infection. The lowest concentration of SARS-CoV-2 viral  copies this assay can detect is 138 copies/mL. A negative result does not preclude SARS-Cov-2 infection and should not be used as the sole basis for treatment or other patient management decisions. A negative result may occur with  improper specimen collection/handling, submission of specimen other than nasopharyngeal swab, presence of viral mutation(s) within the areas targeted by this assay, and inadequate number of viral copies(<138 copies/mL). A negative result must be combined with clinical observations, patient history, and epidemiological information. The expected result is Negative.  Fact Sheet for Patients:  EntrepreneurPulse.com.au  Fact Sheet for Healthcare Providers:  IncredibleEmployment.be  This test is no t yet approved or cleared by the [place]  FDA and  has been authorized for detection and/or diagnosis of SARS-CoV-2 by FDA under an Emergency Use Authorization (EUA). This EUA will remain  in effect (meaning this test can be used) for the duration of the COVID-19 declaration under Section 564(b)(1) of the Act, 21 U.S.C.section 360bbb-3(b)(1), unless the authorization is terminated  or revoked sooner.       Influenza A by PCR NEGATIVE NEGATIVE Final   Influenza B by PCR NEGATIVE NEGATIVE Final    Comment: (NOTE) The Xpert Xpress SARS-CoV-2/FLU/RSV plus assay is intended as an aid in the diagnosis of influenza from Nasopharyngeal swab specimens and should not be used as a sole basis for treatment. Nasal washings and aspirates are unacceptable for Xpert Xpress SARS-CoV-2/FLU/RSV testing.  Fact Sheet for Patients: EntrepreneurPulse.com.au  Fact Sheet for Healthcare Providers: IncredibleEmployment.be  This test is not yet approved or cleared by the [place] FDA and has been authorized for detection and/or diagnosis of SARS-CoV-2 by FDA under an Emergency Use Authorization (EUA). This  EUA will remain in effect (meaning this test can be used) for the duration of the COVID-19 declaration under Section 564(b)(1) of the Act, 21 U.S.C. section 360bbb-3(b)(1), unless the authorization is terminated or revoked.     Resp Syncytial Virus by PCR NEGATIVE NEGATIVE Final    Comment: (NOTE) Fact Sheet for Patients: EntrepreneurPulse.com.au  Fact Sheet for Healthcare Providers: IncredibleEmployment.be  This test is not yet approved or cleared by the [place] FDA and has been authorized for detection and/or diagnosis of SARS-CoV-2 by FDA under an Emergency Use Authorization (EUA). This EUA will remain in effect (meaning this test can be used) for the duration of the COVID-19 declaration under Section 564(b)(1) of the Act, 21 U.S.C. section 360bbb-3(b)(1), unless the authorization is terminated or revoked.  Performed at [facility], [address], [place], [place] [address]   MRSA Next Gen by PCR, Nasal     Status: None   Collection Time: 10/23/22 10:15 AM   Specimen: Nasal Mucosa; Nasal Swab  Result Value Ref Range Status   MRSA by PCR Next Gen NOT DETECTED NOT DETECTED Final    Comment: (NOTE) The GeneXpert MRSA Assay (FDA approved for NASAL specimens only), is one component of a comprehensive MRSA colonization surveillance program. It is not intended to diagnose MRSA infection nor to guide or monitor treatment for MRSA infections. Test performance is not FDA approved in patients less than 22 years old. Performed at [facility], [address], [place], [place] [address]      Radiology Studies: No results found.  Scheduled Meds:  allopurinol  100 mg Oral QHS   apixaban  5 mg Oral BID   atorvastatin  40 mg Oral QHS   carvedilol  12.5 mg Oral BID WC   Chlorhexidine Gluconate Cloth  6 each Topical Q0600   ferrous sulfate  325 mg Oral Q breakfast   insulin aspart  0-9 Units Subcutaneous TID WC   ipratropium-albuterol  3 mL  Nebulization Q6H   irbesartan  75 mg Oral Daily   methylPREDNISolone (SOLU-MEDROL) injection  40 mg Intravenous Q12H   multivitamin with minerals  1 tablet Oral Daily   pantoprazole  40 mg Oral BID AC   Continuous Infusions:   LOS: 5 days   Critical Care Procedure Note Authorized and Performed by: Murvin Natal MD  Total Critical Care time:  36 mins Due to a high probability of clinically significant, life threatening deterioration, the patient required my highest level of preparedness  to intervene emergently and I personally spent this critical care time directly and personally managing the patient.  This critical care time included obtaining a history; examining the patient, pulse oximetry; ordering and review of studies; arranging urgent treatment with development of a management plan; evaluation of patient's response of treatment; frequent reassessment; and discussions with other providers.  This critical care time was performed to assess and manage the high probability of imminent and life threatening deterioration that could result in multi-organ failure.  It was exclusive of separately billable procedures and treating other patients and teaching time.    Andrew Brakeman, MD How to contact the [facility] Attending or Consulting provider [place] or covering provider during after hours [place], for this patient?  Check the care team in [facility] and look for a) attending/consulting [facility] provider listed and b) the [facility] team listed Log into [email or web address] and use [place] universal password to access. If you do not have the password, please contact the hospital operator. Locate the [facility] provider you are looking for under [facility] and page to a number that you can be directly reached. If you still have difficulty reaching the provider, please page the [facility] (Director on Call) for the Hospitalists listed on amion for assistance.  10/25/2022, 1:48 PM

## 2022-10-25 NOTE — Progress Notes (Signed)
Pt has been lethargic, not interactive most of shift.  Pt made no effort to communicate, no effort to feed self (even when tray was left sitting in front of him), no effort to hold phone to speak with wife.  Pt continues to have elevated HR and BP, at this time has required labetalol x 3.  Pt became fidgety during dinner meal feeding, asked pt if back was hurting and pt stated yes.  Gave pt prn oxycodone.  Shortly after, bed alarm was sounding.  Pt found standing at side of bed, leaning over bed, had pulled off some leads, pulse ox, condom cath.  Pt unable to follow directions to get back in bed, after several tries, staff had to assist patient back to bed for patient safety.  Pt continued to state he had pain, gave IV fentanyl to help relieve symptoms.  Pt placed back on O2, increased to 7L.  Linens, gown changed, new condom cath applied.

## 2022-10-26 ENCOUNTER — Inpatient Hospital Stay (HOSPITAL_COMMUNITY): Payer: Medicare HMO

## 2022-10-26 DIAGNOSIS — N184 Chronic kidney disease, stage 4 (severe): Secondary | ICD-10-CM | POA: Diagnosis not present

## 2022-10-26 DIAGNOSIS — E119 Type 2 diabetes mellitus without complications: Secondary | ICD-10-CM | POA: Diagnosis not present

## 2022-10-26 DIAGNOSIS — J441 Chronic obstructive pulmonary disease with (acute) exacerbation: Secondary | ICD-10-CM | POA: Diagnosis not present

## 2022-10-26 DIAGNOSIS — J9601 Acute respiratory failure with hypoxia: Secondary | ICD-10-CM | POA: Diagnosis not present

## 2022-10-26 LAB — CBC
HCT: 31.9 % — ABNORMAL LOW (ref 39.0–52.0)
Hemoglobin: 10.3 g/dL — ABNORMAL LOW (ref 13.0–17.0)
MCH: 28.2 pg (ref 26.0–34.0)
MCHC: 32.3 g/dL (ref 30.0–36.0)
MCV: 87.4 fL (ref 80.0–100.0)
Platelets: 147 10*3/uL — ABNORMAL LOW (ref 150–400)
RBC: 3.65 MIL/uL — ABNORMAL LOW (ref 4.22–5.81)
RDW: 14 % (ref 11.5–15.5)
WBC: 3.5 10*3/uL — ABNORMAL LOW (ref 4.0–10.5)
nRBC: 0 % (ref 0.0–0.2)

## 2022-10-26 LAB — GLUCOSE, CAPILLARY
Glucose-Capillary: 157 mg/dL — ABNORMAL HIGH (ref 70–99)
Glucose-Capillary: 134 mg/dL — ABNORMAL HIGH (ref 70–99)
Glucose-Capillary: 159 mg/dL — ABNORMAL HIGH (ref 70–99)
Glucose-Capillary: 183 mg/dL — ABNORMAL HIGH (ref 70–99)

## 2022-10-26 LAB — RENAL FUNCTION PANEL
Albumin: 3.1 g/dL — ABNORMAL LOW (ref 3.5–5.0)
Anion gap: 7 (ref 5–15)
BUN: 97 mg/dL — ABNORMAL HIGH (ref 8–23)
CO2: 31 mmol/L (ref 22–32)
Calcium: 8.6 mg/dL — ABNORMAL LOW (ref 8.9–10.3)
Chloride: 108 mmol/L (ref 98–111)
Creatinine, Ser: 3.03 mg/dL — ABNORMAL HIGH (ref 0.61–1.24)
GFR, Estimated: 20 mL/min — ABNORMAL LOW (ref >60)
Glucose, Bld: 170 mg/dL — ABNORMAL HIGH (ref 70–99)
Phosphorus: 4.2 mg/dL (ref 2.5–4.6)
Potassium: 5 mmol/L (ref 3.5–5.1)
Sodium: 146 mmol/L — ABNORMAL HIGH (ref 135–145)

## 2022-10-26 LAB — MAGNESIUM: Magnesium: 2.7 mg/dL — ABNORMAL HIGH (ref 1.7–2.4)

## 2022-10-26 MED ORDER — IPRATROPIUM-ALBUTEROL 0.5-2.5 (3) MG/3ML IN SOLN
3.0000 mL | Freq: Three times a day (TID) | RESPIRATORY_TRACT | Status: DC
  Administered 2022-10-27 – 2022-10-30 (×10): 3 mL via RESPIRATORY_TRACT
  Filled 2022-10-26 (×11): qty 3

## 2022-10-26 MED ORDER — BUDESONIDE 0.5 MG/2ML IN SUSP
0.5000 mg | Freq: Two times a day (BID) | RESPIRATORY_TRACT | Status: DC
  Administered 2022-10-26 – 2022-10-30 (×8): 0.5 mg via RESPIRATORY_TRACT
  Filled 2022-10-26 (×9): qty 2

## 2022-10-26 MED ORDER — METOPROLOL TARTRATE 50 MG PO TABS
50.0000 mg | ORAL_TABLET | Freq: Two times a day (BID) | ORAL | Status: DC
  Administered 2022-10-26 – 2022-10-27 (×3): 50 mg via ORAL
  Filled 2022-10-26 (×3): qty 1

## 2022-10-26 MED ORDER — ARFORMOTEROL TARTRATE 15 MCG/2ML IN NEBU
15.0000 ug | INHALATION_SOLUTION | Freq: Two times a day (BID) | RESPIRATORY_TRACT | Status: DC
  Administered 2022-10-26 – 2022-10-30 (×9): 15 ug via RESPIRATORY_TRACT
  Filled 2022-10-26 (×9): qty 2

## 2022-10-26 NOTE — Progress Notes (Signed)
Andrew Adams, Andrew DOA: 10/20/2022 PCP: Lavella Lemons, Andrew Adams  Brief History:  76 year old gentleman with stage IV CKD, hypertension, type 2 diabetes mellitus, pulmonary embolus on apixaban, OSA, diastolic heart failure presented to the emergency department complaining of progressive shortness of breath and increasing edema in bilateral lower extremities over the past week.  He has been coughing and wheezing frequently increasing in sputum production and malaise.  He has been using his home bronchodilators but does not seem to be able to control the symptoms.  He denies fever and chills.  He denies nausea vomiting and diarrhea.  He was noted in the ED to have 3+ pitting edema bilateral lower extremities and diffuse wheezing.  He was started on IV Lasix and treatment for acute COPD exacerbation.  Patient reports no chest pain symptoms.  Patient reports he may not have been taking his Lasix regularly in the last several weeks.  He is being admitted for further management.   Assessment/Plan:  COPD with acute exacerbation  - continue scheduled bronchodilators - doxycycline 100 mg BID - mucinex 1200 mg BID  - add pulmicort - add brovana - supplemental oxygen - RT consult for resp support  - flutter valve -continue IV solumedrol BID  -12/24: noted to be somnolent --->ABG ordered; pH 7.14, pCO2 98 -bipap started, transferred to stepdown ICU   Acute hypoxic and hypercarbic respiratory failure -pt has consistently refused to wear nightly CPAP -due to COPD exacerbation -noted in AM 12/24 to be confused/somnolent; pCO2 98, pH 7.14 -started on bipap 12/24 with improvement in mentation -continue nightly bipap   Paroxysmal SVT -d/c coreg -start metoprolol -check TSH -personally reviewed EKG--SVT, nonspecific ST change   Acute on chronic HFpEF - treated with IV lasix; hold lasix  with uptrend creatinine - not sure she is taking regularly at  home - monitor weight, I/O, electrolytes - 08/20/22 Echo EF 60-65%, no WMA, G2DD, normal RVF - obtain ReDS Vest reading   Bilateral Lower Extremity Edema - treated with IV lasix with improvement - holding amlodipine 10 mg as this can cause severe LE edema - elevate legs  - TED hoses to BLEs  - improving    Essential hypertension  - resume home meds EXCEPT amlodipine - monitor BP closely and adjust treatment plan as needed   AKI on Stage IV CKD - he is followed by Dr. Theador Hawthorne (nephrologist) - he will need to follow up with Dr. Theador Hawthorne as an outpatient  - d/c ARB -baseline creatinine 2.1-2.4 - hold lasix   Controlled diabetes mellitus type 2 -Holding Trulicity>>restart after d/c -08/19/22 hemoglobin A1c--6.4 -NovoLog sliding scale  Acquired Thrombophilia - apixaban 5 mg BID has been resumed   PE - resumed home apixaban as above    Hyperlipidemia - resumed home atorvastatin daily    OSA -nightly CPAP refused by patient -pt claims he will start wearing CPAP tonight   Dilated ascending aorta -incidental finding on echo 08/20/22 -measured 38 mm on echo -will need outpatient surveilance   Family Communication:   no Family at bedside  Consultants:  none  Code Status:  FULL   DVT Prophylaxis:apixaban   Procedures: As Listed in Andrew Note Above  Antibiotics: None     Subjective: Pt still has sob, only a little better.  Has dyspnea on exertion.  Denies f/c, cp, abd pain, n/v/d, dysuria  Objective: Vitals:   10/26/22 4235 10/26/22 0753 10/26/22 3614  10/26/22 1104  BP:  (!) 181/78    Pulse: 71 (!) 122    Resp: 12     Temp: 98.3 F (36.8 C)   98.5 F (36.9 C)  TempSrc: Axillary   Oral  SpO2: 99% 97% 98%   Weight:      Height:        Intake/Output Summary (Last 24 hours) at 10/26/2022 1136 Last data filed at 10/26/2022 1610 Gross per 24 hour  Intake 240 ml  Output 2875 ml  Net -2635 ml   Weight change: 0.1 kg Exam:  General:  Pt is  alert, follows commands appropriately, not in acute distress HEENT: No icterus, No thrush, No neck mass, [place]/AT Cardiovascular: RRR, S1/S2, no rubs, no gallops Respiratory: bibasilar rales. No wheeze Abdomen: Soft/+BS, non tender, non distended, no guarding Extremities: trace LE edema, No lymphangitis, No petechiae, No rashes, no synovitis   Data Reviewed: I have personally reviewed following labs and imaging studies Basic Metabolic Panel: Recent Labs  Lab 10/21/22 0312 10/23/22 0912 10/24/22 0358 10/25/22 0334 10/26/22 0421  NA 136 136 136 142 146*  K 4.7 5.3* 5.0 4.8 5.0  CL 97* 99 100 105 108  CO2 32 32 '27 30 31  '$ GLUCOSE 147* 152* 155* 158* 170*  BUN 28* 72* 77* 88* 97*  CREATININE 2.77* 3.70* 3.28* 2.82* 3.03*  CALCIUM 8.3* 8.2* 8.0* 8.5* 8.6*  MG 2.0  --  2.4  --  2.7*  PHOS  --   --   --  4.4 4.2   Liver Function Tests: Recent Labs  Lab 10/20/22 0901 10/25/22 0334 10/26/22 0421  AST 19  --   --   ALT 12  --   --   ALKPHOS 53  --   --   BILITOT 0.7  --   --   PROT 6.4*  --   --   ALBUMIN 3.5 3.4* 3.1*   Recent Labs  Lab 10/20/22 0901  LIPASE 34   No results for input(s): "AMMONIA" in the last 168 hours. Coagulation Profile: No results for input(s): "INR", "PROTIME" in the last 168 hours. CBC: Recent Labs  Lab 10/20/22 0901 10/21/22 0312 10/24/22 0358 10/26/22 0421  WBC 4.2 3.9* 6.0 3.5*  NEUTROABS 2.3  --   --   --   HGB 9.8* 10.1* 10.5* 10.3*  HCT 30.8* 32.6* 33.7* 31.9*  MCV 87.7 89.6 89.6 87.4  PLT 158 182 152 147*   Cardiac Enzymes: No results for input(s): "CKTOTAL", "CKMB", "CKMBINDEX", "TROPONINI" in the last 168 hours. BNP: Invalid input(s): "POCBNP" CBG: Recent Labs  Lab 10/25/22 0732 10/25/22 1611 10/25/22 2111 10/26/22 0733 10/26/22 1102  GLUCAP 157* 157* 192* 157* 134*   HbA1C: No results for input(s): "HGBA1C" in the last 72 hours. Urine analysis:    Component Value Date/Time   COLORURINE YELLOW 08/18/2022 1116    APPEARANCEUR CLEAR 08/18/2022 1116   LABSPEC 1.009 08/18/2022 1116   PHURINE 6.0 08/18/2022 1116   GLUCOSEU NEGATIVE 08/18/2022 1116   [place] 08/18/2022 1116   [place] 08/18/2022 1116   BILIRUBINUR negative 01/20/2014 1032   KETONESUR NEGATIVE 08/18/2022 1116   PROTEINUR 100 (A) 08/18/2022 1116   UROBILINOGEN negative 01/20/2014 1032   NITRITE NEGATIVE 08/18/2022 1116   LEUKOCYTESUR TRACE (A) 08/18/2022 1116   Sepsis Labs: '@LABRCNTIP'$ (procalcitonin:4,lacticidven:4) ) Recent Results (from the past 240 hour(s))  Resp panel by RT-PCR (RSV, Flu A&B, Covid) Anterior Nasal Swab     Status: None   Collection Time: 10/20/22  9:08 AM   Specimen: Anterior Nasal Swab  Result Value Ref Range Status   SARS Coronavirus 2 by RT PCR NEGATIVE NEGATIVE Final    Comment: (NOTE) SARS-CoV-2 target nucleic acids are NOT DETECTED.  The SARS-CoV-2 RNA is generally detectable in upper respiratory specimens during the acute phase of infection. The lowest concentration of SARS-CoV-2 viral copies this assay can detect is 138 copies/mL. A negative result does not preclude SARS-Cov-2 infection and should not be used as the sole basis for treatment or other patient management decisions. A negative result may occur with  improper specimen collection/handling, submission of specimen other than nasopharyngeal swab, presence of viral mutation(s) within the areas targeted by this assay, and inadequate number of viral copies(<138 copies/mL). A negative result must be combined with clinical observations, patient history, and epidemiological information. The expected result is Negative.  Fact Sheet for Patients:  EntrepreneurPulse.com.au  Fact Sheet for Healthcare Providers:  IncredibleEmployment.be  This test is no t yet approved or cleared by the [place] FDA and  has been authorized for detection and/or diagnosis of SARS-CoV-2 by FDA under an  Emergency Use Authorization (EUA). This EUA will remain  in effect (meaning this test can be used) for the duration of the COVID-19 declaration under Section 564(b)(1) of the Act, 21 U.S.C.section 360bbb-3(b)(1), unless the authorization is terminated  or revoked sooner.       Influenza A by PCR NEGATIVE NEGATIVE Final   Influenza B by PCR NEGATIVE NEGATIVE Final    Comment: (NOTE) The Xpert Xpress SARS-CoV-2/FLU/RSV plus assay is intended as an aid in the diagnosis of influenza from Nasopharyngeal swab specimens and should not be used as a sole basis for treatment. Nasal washings and aspirates are unacceptable for Xpert Xpress SARS-CoV-2/FLU/RSV testing.  Fact Sheet for Patients: EntrepreneurPulse.com.au  Fact Sheet for Healthcare Providers: IncredibleEmployment.be  This test is not yet approved or cleared by the [place] FDA and has been authorized for detection and/or diagnosis of SARS-CoV-2 by FDA under an Emergency Use Authorization (EUA). This EUA will remain in effect (meaning this test can be used) for the duration of the COVID-19 declaration under Section 564(b)(1) of the Act, 21 U.S.C. section 360bbb-3(b)(1), unless the authorization is terminated or revoked.     Resp Syncytial Virus by PCR NEGATIVE NEGATIVE Final    Comment: (NOTE) Fact Sheet for Patients: EntrepreneurPulse.com.au  Fact Sheet for Healthcare Providers: IncredibleEmployment.be  This test is not yet approved or cleared by the [place] FDA and has been authorized for detection and/or diagnosis of SARS-CoV-2 by FDA under an Emergency Use Authorization (EUA). This EUA will remain in effect (meaning this test can be used) for the duration of the COVID-19 declaration under Section 564(b)(1) of the Act, 21 U.S.C. section 360bbb-3(b)(1), unless the authorization is terminated or revoked.  Performed at [facility],  [address] [place], [place] [address]   MRSA Next Gen by PCR, Nasal     Status: None   Collection Time: 10/23/22 10:15 AM   Specimen: Nasal Mucosa; Nasal Swab  Result Value Ref Range Status   MRSA by PCR Next Gen NOT DETECTED NOT DETECTED Final    Comment: (NOTE) The GeneXpert MRSA Assay (FDA approved for NASAL specimens only), is one component of a comprehensive MRSA colonization surveillance program. It is not intended to diagnose MRSA infection nor to guide or monitor treatment for MRSA infections. Test performance is not FDA approved in patients less than 54 years old. Performed at [facility],  [address] [place], [place] [address]      Scheduled Meds:  allopurinol  100 mg Oral QHS   apixaban  5 mg Oral BID   arformoterol  15 mcg Nebulization BID   atorvastatin  40 mg Oral QHS   budesonide (PULMICORT) nebulizer solution  0.5 mg Nebulization BID   Chlorhexidine Gluconate Cloth  6 each Topical Q0600   ferrous sulfate  325 mg Oral Q breakfast   insulin aspart  0-9 Units Subcutaneous TID WC   ipratropium-albuterol  3 mL Nebulization Q6H   methylPREDNISolone (SOLU-MEDROL) injection  40 mg Intravenous Q12H   metoprolol tartrate  50 mg Oral BID   multivitamin with minerals  1 tablet Oral Daily   pantoprazole  40 mg Oral BID AC   Continuous Infusions:  Procedures/Studies: DG CHEST PORT 1 VIEW  Result Date: 10/26/2022 CLINICAL DATA:  [id number] COPD with acute exacerbation ([place]) [id number] EXAM: PORTABLE CHEST 1 VIEW COMPARISON:  October 23, 2022 FINDINGS: The cardiomediastinal silhouette is unchanged in contour. Favored trace bilateral pleural effusions. No pneumothorax. Improved aeration in the RIGHT lung. Persistent scattered bibasilar opacities, likely atelectasis. Central vascular congestion. IMPRESSION: Improved aeration in the RIGHT lung. Persistent bibasilar atelectasis and trace bilateral pleural effusions. Electronically Signed   By: Valentino Saxon M.D.   On: 10/26/2022  07:51   DG CHEST PORT 1 VIEW  Result Date: 10/23/2022 CLINICAL DATA:  COPD EXAM: PORTABLE CHEST 1 VIEW COMPARISON:  Two days ago FINDINGS: Cardiomegaly and vascular pedicle widening. Band of atelectasis along the right minor fissure, lung volumes are low. Diffuse interstitial prominence. No focal consolidation, effusion, or pneumothorax. IMPRESSION: 1. Cardiomegaly and vascular congestion. 2. Worsening lung volumes and increased atelectasis. Electronically Signed   By: Jorje Guild M.D.   On: 10/23/2022 09:25   DG Chest Port 1 View  Result Date: 10/21/2022 CLINICAL DATA:  [id number] CHF (congestive heart failure) [facility]) [phone] [id number] COPD with acute exacerbation ([place]) [id number] EXAM: PORTABLE CHEST 1 VIEW COMPARISON:  October 20, 2022 FINDINGS: The cardiomediastinal silhouette is unchanged in contour. Trace bilateral pleural effusions. No pneumothorax. Chronic bronchitic markings. Perihilar vascular fullness without overt edema. Bibasilar platelike opacities. IMPRESSION: 1. Bibasilar platelike opacities, favored to reflect atelectasis. Favored trace bilateral pleural effusions. 2. Changes compatible with chronic bronchitis. Electronically Signed   By: Valentino Saxon M.D.   On: 10/21/2022 07:51   DG Chest Portable 1 View  Result Date: 10/20/2022 CLINICAL DATA:  Short of breath. EXAM: PORTABLE CHEST 1 VIEW COMPARISON:  Chest 08/18/2022 FINDINGS: Mild cardiac enlargement.  Negative for heart failure. Elevated right hemidiaphragm with mild right lower lobe atelectasis. Left lung clear. No effusion IMPRESSION: Elevated right hemidiaphragm with mild right lower lobe atelectasis. Electronically Signed   By: Franchot Gallo M.D.   On: 10/20/2022 09:33    Orson Eva, DO  [facility]  If 7PM-7AM, please contact night-coverage [email or web address] Password [facility] 10/26/2022, 11:36 AM   LOS: 6 days

## 2022-10-26 NOTE — Evaluation (Signed)
Physical Therapy Evaluation Patient Details Name: Andrew Adams MRN: [id number] DOB: 27-Jan-1946 Today's Date: 10/26/2022  History of Present Illness  Andrew Adams is a 76 year old gentleman with stage IV CKD, hypertension, type 2 diabetes mellitus, pulmonary embolus on apixaban, OSA, diastolic heart failure presented to the emergency department complaining of progressive shortness of breath and increasing edema in bilateral lower extremities over the past week.  He has been coughing and wheezing frequently increasing in sputum production and malaise.  He has been using his home bronchodilators but does not seem to be able to control the symptoms.  He denies fever and chills.  He denies nausea vomiting and diarrhea.  He was noted in the ED to have 3+ pitting edema bilateral lower extremities and diffuse wheezing.  He was started on IV Lasix and treatment for acute COPD exacerbation.  Patient reports no chest pain symptoms.  Patient reports he may not have been taking his Lasix regularly in the last several weeks.  He is being admitted for further management.   Clinical Impression  Patient demonstrates slow labored movement for sitting up at bedside, unable to complete sit to stands without use of AD due to BLE, required use of RW, once standing very unsteady on feet, limited to a few side steps before having to sit due to fatigue and generalized weakness.  Patient will benefit from continued skilled physical therapy in hospital and recommended venue below to increase strength, balance, endurance for safe ADLs and gait.         Recommendations for follow up therapy are one component of a multi-disciplinary discharge planning process, led by the attending physician.  Recommendations may be updated based on patient status, additional functional criteria and insurance authorization.  Follow Up Recommendations Skilled nursing-short term rehab (<3 hours/day) Can patient physically be transported by  private vehicle: Yes    Assistance Recommended at Discharge Intermittent Supervision/Assistance  Patient can return home with the following  A lot of help with bathing/dressing/bathroom;A lot of help with walking and/or transfers;Help with stairs or ramp for entrance;Assistance with cooking/housework    Equipment Recommendations None recommended by PT  Recommendations for Other Services       Functional Status Assessment Patient has had a recent decline in their functional status and demonstrates the ability to make significant improvements in function in a reasonable and predictable amount of time.     Precautions / Restrictions Precautions Precautions: Fall Restrictions Weight Bearing Restrictions: No      Mobility  Bed Mobility Overal bed mobility: Needs Assistance Bed Mobility: Supine to Sit     Supine to sit: Min assist, Mod assist     General bed mobility comments: increased time, labored movement    Transfers Overall transfer level: Needs assistance Equipment used: Rolling walker (2 wheels) Transfers: Sit to/from Stand, Bed to chair/wheelchair/BSC Sit to Stand: Min assist, Mod assist   Step pivot transfers: Min assist, Mod assist       General transfer comment: increased time, labored movement, diffiuclty completing sit to stands due to BLE weakness    Ambulation/Gait Ambulation/Gait assistance: Mod assist Gait Distance (Feet): 5 Feet Assistive device: Rolling walker (2 wheels) Gait Pattern/deviations: Decreased step length - right, Decreased step length - left, Decreased stride length Gait velocity: decreased     General Gait Details: limited to a few slow labored side steps at bedside due to BLE weakness and fall risk  Stairs            Wheelchair  Mobility    Modified Rankin (Stroke Patients Only)       Balance Overall balance assessment: Needs assistance Sitting-balance support: Feet supported, No upper extremity supported Sitting  balance-Leahy Scale: Fair Sitting balance - Comments: fair/good seated at EOIB   Standing balance support: During functional activity, No upper extremity supported Standing balance-Leahy Scale: Poor Standing balance comment: fair/poor using RW                             Pertinent Vitals/Pain Pain Assessment Pain Assessment: No/denies pain    Home Living Family/patient expects to be discharged to:: Private residence Living Arrangements: Spouse/significant other Available Help at Discharge: Family Type of Home: House Home Access: Level entry       Home Layout: One level Home Equipment: Conservation officer, nature (2 wheels);Cane - single point      Prior Function Prior Level of Function : Independent/Modified Independent;Working/employed;Driving             Mobility Comments: Patient stating community ambulation without AD, independent, 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 ADLs Comments: Independent     Hand Dominance        Extremity/Trunk Assessment   Upper Extremity Assessment Upper Extremity Assessment: Generalized weakness    Lower Extremity Assessment Lower Extremity Assessment: Generalized weakness    Cervical / Trunk Assessment Cervical / Trunk Assessment: Normal  Communication   Communication: No difficulties  Cognition Arousal/Alertness: Awake/alert Behavior During Therapy: WFL for tasks assessed/performed Overall Cognitive Status: No family/caregiver present to determine baseline cognitive functioning                                          General Comments      Exercises     Assessment/Plan    PT Assessment Patient needs continued PT services  PT Problem List Decreased activity tolerance;Decreased balance;Decreased mobility       PT Treatment Interventions DME instruction;Gait training;Stair training;Functional mobility training;Therapeutic activities;Therapeutic exercise;Patient/family  education;Balance training    PT Goals (Current goals can be found in the Care Plan section)  Acute Rehab PT Goals Patient Stated Goal: return home PT Goal Formulation: With patient Time For Goal Achievement: 11/09/22 Potential to Achieve Goals: Good    Frequency Min 3X/week     Co-evaluation               AM-PAC PT "6 Clicks" Mobility  Outcome Measure Help needed turning from your back to your side while in a flat bed without using bedrails?: A Lot Help needed moving from lying on your back to sitting on the side of a flat bed without using bedrails?: A Lot Help needed moving to and from a bed to a chair (including a wheelchair)?: A Lot Help needed standing up from a chair using your arms (e.g., wheelchair or bedside chair)?: A Lot Help needed to walk in hospital room?: A Lot Help needed climbing 3-5 steps with a railing? : Total 6 Click Score: 11    End of Session Equipment Utilized During Treatment: Oxygen Activity Tolerance: Patient tolerated treatment well;Patient limited by fatigue Patient left: in chair;with call bell/phone within reach Nurse Communication: Mobility status PT Visit Diagnosis: Unsteadiness on feet (R26.81);Other abnormalities of gait and mobility (R26.89);Muscle weakness (generalized) (M62.81)    Time: 1497-0263 PT Time Calculation (min) (ACUTE ONLY): 16 min   Charges:   PT  Evaluation $PT Eval Low Complexity: 1 Low PT Treatments $Therapeutic Activity: 8-22 mins        12:25 PM, 10/26/22 Lonell Grandchild, MPT Physical Therapist with [facility] [phone] office [phone] mobile phone

## 2022-10-26 NOTE — Plan of Care (Signed)
  Problem: Acute Rehab PT Goals(only PT should resolve) Goal: Pt Will Go Supine/Side To Sit Outcome: Progressing Flowsheets (Taken 10/26/2022 1226) Pt will go Supine/Side to Sit:  with supervision  with min guard assist Goal: Patient Will Transfer Sit To/From Stand Outcome: Progressing Flowsheets (Taken 10/26/2022 1226) Patient will transfer sit to/from stand:  with min guard assist  with minimal assist Goal: Pt Will Transfer Bed To Chair/Chair To Bed Outcome: Progressing Flowsheets (Taken 10/26/2022 1226) Pt will Transfer Bed to Chair/Chair to Bed:  min guard assist  with min assist Goal: Pt Will Ambulate Outcome: Progressing Flowsheets (Taken 10/26/2022 1226) Pt will Ambulate:  50 feet  with supervision  with min guard assist  with rolling walker   12:26 PM, 10/26/22 Lonell Grandchild, MPT Physical Therapist with [facility] 336 [phone] office [phone] mobile phone

## 2022-10-26 NOTE — TOC Progression Note (Signed)
Transition of Care [facility]) - Progression Note    Patient Details  Name: Andrew Adams MRN: [id number] Date of Birth: 12-27-1945  Transition of [facility]) CM/SW Contact  Boneta Lucks, RN Phone Number: 10/26/2022, 2:26 PM  Clinical Narrative:   PT is recommending SNF. CM called room spoke with wife, Andrew Adams and patient. Patient does not want to go to rehab.  He is agreeable to Home health. Referral sent to Sarah with [facility]. TOC to follow.    Expected Discharge Plan: [place] Barriers to Discharge: Continued Medical Work up  Expected Discharge Plan and [place] arrangements for the past 2 months: [place]: PT   Date [place]: 10/26/22 Time [place]: 1017 Representative spoke with at [place]: Billey Chang   Social Determinants of Health ([place]) Interventions [place]: No Food Insecurity (10/20/2022)  Housing: Low Risk  (10/20/2022)  Transportation Needs: No Transportation Needs (10/20/2022)  Utilities: Not At Risk (10/20/2022)  Tobacco Use: Medium Risk (10/20/2022)   Readmission Risk Interventions    10/21/2022    3:31 PM  Readmission Risk Prevention Plan  Transportation Screening Complete  PCP or Specialist Appt within 3-5 Days Not Complete  HRI or [place] Complete  Social Work Consult for [place] Planning/Counseling Complete  Palliative Care Screening Not Applicable  Medication Review Press photographer) Complete

## 2022-10-27 DIAGNOSIS — J441 Chronic obstructive pulmonary disease with (acute) exacerbation: Secondary | ICD-10-CM | POA: Diagnosis not present

## 2022-10-27 DIAGNOSIS — Z7189 Other specified counseling: Secondary | ICD-10-CM

## 2022-10-27 DIAGNOSIS — I471 Supraventricular tachycardia, unspecified: Secondary | ICD-10-CM | POA: Diagnosis not present

## 2022-10-27 DIAGNOSIS — N184 Chronic kidney disease, stage 4 (severe): Secondary | ICD-10-CM | POA: Diagnosis not present

## 2022-10-27 DIAGNOSIS — Z515 Encounter for palliative care: Secondary | ICD-10-CM

## 2022-10-27 DIAGNOSIS — J9601 Acute respiratory failure with hypoxia: Secondary | ICD-10-CM | POA: Diagnosis not present

## 2022-10-27 LAB — GLUCOSE, CAPILLARY
Glucose-Capillary: 150 mg/dL — ABNORMAL HIGH (ref 70–99)
Glucose-Capillary: 155 mg/dL — ABNORMAL HIGH (ref 70–99)
Glucose-Capillary: 166 mg/dL — ABNORMAL HIGH (ref 70–99)
Glucose-Capillary: 184 mg/dL — ABNORMAL HIGH (ref 70–99)
Glucose-Capillary: 136 mg/dL — ABNORMAL HIGH (ref 70–99)

## 2022-10-27 LAB — RENAL FUNCTION PANEL
Albumin: 3.2 g/dL — ABNORMAL LOW (ref 3.5–5.0)
Anion gap: 6 (ref 5–15)
BUN: 94 mg/dL — ABNORMAL HIGH (ref 8–23)
CO2: 32 mmol/L (ref 22–32)
Calcium: 8.5 mg/dL — ABNORMAL LOW (ref 8.9–10.3)
Chloride: 109 mmol/L (ref 98–111)
Creatinine, Ser: 2.75 mg/dL — ABNORMAL HIGH (ref 0.61–1.24)
GFR, Estimated: 23 mL/min — ABNORMAL LOW (ref >60)
Glucose, Bld: 183 mg/dL — ABNORMAL HIGH (ref 70–99)
Phosphorus: 4.1 mg/dL (ref 2.5–4.6)
Potassium: 4.6 mmol/L (ref 3.5–5.1)
Sodium: 147 mmol/L — ABNORMAL HIGH (ref 135–145)

## 2022-10-27 LAB — BASIC METABOLIC PANEL
Anion gap: 6 (ref 5–15)
BUN: 93 mg/dL — ABNORMAL HIGH (ref 8–23)
CO2: 32 mmol/L (ref 22–32)
Calcium: 8.6 mg/dL — ABNORMAL LOW (ref 8.9–10.3)
Chloride: 108 mmol/L (ref 98–111)
Creatinine, Ser: 2.69 mg/dL — ABNORMAL HIGH (ref 0.61–1.24)
GFR, Estimated: 23 mL/min — ABNORMAL LOW (ref >60)
Glucose, Bld: 182 mg/dL — ABNORMAL HIGH (ref 70–99)
Potassium: 4.7 mmol/L (ref 3.5–5.1)
Sodium: 146 mmol/L — ABNORMAL HIGH (ref 135–145)

## 2022-10-27 LAB — BRAIN NATRIURETIC PEPTIDE: B Natriuretic Peptide: 259 pg/mL — ABNORMAL HIGH (ref 0.0–100.0)

## 2022-10-27 LAB — PHOSPHORUS: Phosphorus: 4.1 mg/dL (ref 2.5–4.6)

## 2022-10-27 LAB — MAGNESIUM: Magnesium: 2.4 mg/dL (ref 1.7–2.4)

## 2022-10-27 LAB — PROCALCITONIN: Procalcitonin: <0.1 ng/mL

## 2022-10-27 MED ORDER — LABETALOL HCL 5 MG/ML IV SOLN
20.0000 mg | INTRAVENOUS | Status: DC | PRN
  Administered 2022-10-27 (×3): 20 mg via INTRAVENOUS
  Filled 2022-10-27 (×5): qty 4

## 2022-10-27 MED ORDER — METOPROLOL TARTRATE 50 MG PO TABS
100.0000 mg | ORAL_TABLET | Freq: Two times a day (BID) | ORAL | Status: DC
  Administered 2022-10-27 – 2022-10-28 (×2): 100 mg via ORAL
  Filled 2022-10-27 (×2): qty 2

## 2022-10-27 MED ORDER — METOPROLOL TARTRATE 50 MG PO TABS
50.0000 mg | ORAL_TABLET | Freq: Once | ORAL | Status: AC
  Administered 2022-10-27: 50 mg via ORAL
  Filled 2022-10-27: qty 1

## 2022-10-27 MED ORDER — DILTIAZEM HCL 30 MG PO TABS
30.0000 mg | ORAL_TABLET | Freq: Four times a day (QID) | ORAL | Status: AC
  Administered 2022-10-27 – 2022-10-30 (×12): 30 mg via ORAL
  Filled 2022-10-27 (×12): qty 1

## 2022-10-27 NOTE — Progress Notes (Signed)
Palliative: Thank you for this consult.  Unfortunately, due to illness/staffing issues there will be a delay in a Palliative Provider seeing this patient. Palliative Medicine will return to service on 11/01/2022 and will see patient at that time, if patient is still hospitalized.  No charge  Sophiana Milanese, NP Palliative Medicine Please call Palliative Medicine team phone with any questions [phone]. For individual providers please see AMION. 

## 2022-10-27 NOTE — Progress Notes (Signed)
PROGRESS NOTE  Andrew Adams:[id number] DOB: [birth year] DOA: 10/20/2022 PCP: Lavella Lemons, PA  Brief History:  76 year old gentleman with stage IV CKD, hypertension, type 2 diabetes mellitus, pulmonary embolus on apixaban, OSA, diastolic heart failure presented to the emergency department complaining of progressive shortness of breath and increasing edema in bilateral lower extremities over the past week.  He has been coughing and wheezing frequently increasing in sputum production and malaise.  He has been using his home bronchodilators but does not seem to be able to control the symptoms.  He denies fever and chills.  He denies nausea vomiting and diarrhea.  He was noted in the ED to have 3+ pitting edema bilateral lower extremities and diffuse wheezing.  He was started on IV Lasix and treatment for acute COPD exacerbation.  Patient reports no chest pain symptoms.  Patient reports he may not have been taking his Lasix regularly in the last several weeks.  He is being admitted for further management.   Assessment/Plan: COPD with acute exacerbation  - continue scheduled bronchodilators - doxycycline 100 mg BID - mucinex 1200 mg BID  - added pulmicort - added brovana - supplemental oxygen--2L - RT consult for resp support  - flutter valve -continue IV solumedrol BID  -12/24: noted to be somnolent --->ABG ordered; pH 7.14, pCO2 98 -bipap started, transferred to stepdown ICU   Acute hypoxic and hypercarbic respiratory failure -pt has consistently refused to wear nightly CPAP -due to COPD exacerbation and pulm edema -noted in AM 12/24 to be confused/somnolent; pCO2 98, pH 7.14 -started on bipap 12/24 with improvement in mentation -continue nightly bipap  -now stable on 2L   Paroxysmal SVT -d/c coreg -started metoprolol>>increase dose -start diltiazem -check TSH -personally reviewed EKG--SVT, nonspecific ST change - 08/20/22 Echo EF 60-65%, no WMA, G2DD, normal  RVF   Acute on chronic HFpEF - treated with IV lasix; hold lasix  with uptrend creatinine - monitor weight, I/O,--NEG 10 lbs - 08/20/22 Echo EF 60-65%, no WMA, G2DD, normal RVF - obtain ReDS Vest reading--40   Essential hypertension  - resume home meds EXCEPT amlodipine - monitor BP closely and adjust treatment plan as needed   AKI on Stage IV CKD - he is followed by Dr. Theador Hawthorne (nephrologist) - he will need to follow up with Dr. Theador Hawthorne as an outpatient  - d/c ARB -baseline creatinine 2.1-2.4 -serum creatinine peakd 3.70 - hold lasix due to uptrend in serum creatinine>>improving   Controlled diabetes mellitus type 2 -Holding Trulicity>>restart after d/c -08/19/22 hemoglobin A1c--6.4 -NovoLog sliding scale   Acquired Thrombophilia - apixaban 5 mg BID has been resumed   PE - resumed home apixaban as above    Hyperlipidemia - resumed home atorvastatin daily    OSA -nightly CPAP refused by patient -pt claims he will start wearing CPAP tonight    Dilated ascending aorta -incidental finding on echo 08/20/22 -measured 38 mm on echo -will need outpatient surveilance     Family Communication:   no Family at bedside   Consultants:  none   Code Status:  FULL    DVT Prophylaxis:apixaban     Procedures: As Listed in Progress Note Above   Antibiotics: None         Subjective: Patient denies fevers, chills, headache, chest pain, dyspnea, nausea, vomiting, diarrhea, abdominal pain, dysuria, hematuria, hematochezia, and melena.   Objective: Vitals:   10/27/22 0900 10/27/22 1000 10/27/22 1116 10/27/22 1302  BP: Marland Kitchen)  197/125   (!) 197/125  Pulse: (!) 128 79  79  Resp: (!) 23 (!) 27    Temp:   98.6 F (37 C)   TempSrc:   Oral   SpO2: 95% 93%    Weight:      Height:        Intake/Output Summary (Last 24 hours) at 10/27/2022 1310 Last data filed at 10/27/2022 1156 Gross per 24 hour  Intake 1180 ml  Output 2250 ml  Net -1070 ml   Weight change: 0.1  kg Exam:  General:  Pt is alert, follows commands appropriately, not in acute distress HEENT: No icterus, No thrush, No neck mass, [place]/AT Cardiovascular: RRR, S1/S2, no rubs, no gallops Respiratory: bibasilar rales. No wheeze Abdomen: Soft/+BS, non tender, non distended, no guarding Extremities: trace LE edema, No lymphangitis, No petechiae, No rashes, no synovitis   Data Reviewed: I have personally reviewed following labs and imaging studies Basic Metabolic Panel: Recent Labs  Lab 10/21/22 0312 10/23/22 0912 10/24/22 0358 10/25/22 0334 10/26/22 0421 10/27/22 0421 10/27/22 0422  NA 136   < > 136 142 146* 147* 146*  K 4.7   < > 5.0 4.8 5.0 4.6 4.7  CL 97*   < > 100 105 108 109 108  CO2 32   < > '27 30 31 '$ 32 32  GLUCOSE 147*   < > 155* 158* 170* 183* 182*  BUN 28*   < > 77* 88* 97* 94* 93*  CREATININE 2.77*   < > 3.28* 2.82* 3.03* 2.75* 2.69*  CALCIUM 8.3*   < > 8.0* 8.5* 8.6* 8.5* 8.6*  MG 2.0  --  2.4  --  2.7*  --  2.4  PHOS  --   --   --  4.4 4.2 4.1 4.1   < > = values in this interval not displayed.   Liver Function Tests: Recent Labs  Lab 10/25/22 0334 10/26/22 0421 10/27/22 0421  ALBUMIN 3.4* 3.1* 3.2*   No results for input(s): "LIPASE", "AMYLASE" in the last 168 hours. No results for input(s): "AMMONIA" in the last 168 hours. Coagulation Profile: No results for input(s): "INR", "PROTIME" in the last 168 hours. CBC: Recent Labs  Lab 10/21/22 0312 10/24/22 0358 10/26/22 0421  WBC 3.9* 6.0 3.5*  HGB 10.1* 10.5* 10.3*  HCT 32.6* 33.7* 31.9*  MCV 89.6 89.6 87.4  PLT 182 152 147*   Cardiac Enzymes: No results for input(s): "CKTOTAL", "CKMB", "CKMBINDEX", "TROPONINI" in the last 168 hours. BNP: Invalid input(s): "POCBNP" CBG: Recent Labs  Lab 10/26/22 1546 10/26/22 1933 10/27/22 0211 10/27/22 0723 10/27/22 1105  GLUCAP 159* 183* 150* 155* 166*   HbA1C: No results for input(s): "HGBA1C" in the last 72 hours. Urine analysis:    Component Value  Date/Time   COLORURINE YELLOW 08/18/2022 1116   APPEARANCEUR CLEAR 08/18/2022 1116   LABSPEC 1.009 08/18/2022 1116   PHURINE 6.0 08/18/2022 1116   GLUCOSEU NEGATIVE 08/18/2022 1116   [place] 08/18/2022 1116   [place] 08/18/2022 1116   BILIRUBINUR negative 01/20/2014 1032   KETONESUR NEGATIVE 08/18/2022 1116   PROTEINUR 100 (A) 08/18/2022 1116   UROBILINOGEN negative 01/20/2014 1032   NITRITE NEGATIVE 08/18/2022 1116   LEUKOCYTESUR TRACE (A) 08/18/2022 1116   Sepsis Labs: '@LABRCNTIP'$ (procalcitonin:4,lacticidven:4) ) Recent Results (from the past 240 hour(s))  Resp panel by RT-PCR (RSV, Flu A&B, Covid) Anterior Nasal Swab     Status: None   Collection Time: 10/20/22  9:08 AM   Specimen: Anterior Nasal  Swab  Result Value Ref Range Status   SARS Coronavirus 2 by RT PCR NEGATIVE NEGATIVE Final    Comment: (NOTE) SARS-CoV-2 target nucleic acids are NOT DETECTED.  The SARS-CoV-2 RNA is generally detectable in upper respiratory specimens during the acute phase of infection. The lowest concentration of SARS-CoV-2 viral copies this assay can detect is 138 copies/mL. A negative result does not preclude SARS-Cov-2 infection and should not be used as the sole basis for treatment or other patient management decisions. A negative result may occur with  improper specimen collection/handling, submission of specimen other than nasopharyngeal swab, presence of viral mutation(s) within the areas targeted by this assay, and inadequate number of viral copies(<138 copies/mL). A negative result must be combined with clinical observations, patient history, and epidemiological information. The expected result is Negative.  Fact Sheet for Patients:  EntrepreneurPulse.com.au  Fact Sheet for Healthcare Providers:  IncredibleEmployment.be  This test is no t yet approved or cleared by the [place] FDA and  has been authorized for detection  and/or diagnosis of SARS-CoV-2 by FDA under an Emergency Use Authorization (EUA). This EUA will remain  in effect (meaning this test can be used) for the duration of the COVID-19 declaration under Section 564(b)(1) of the Act, 21 U.S.C.section 360bbb-3(b)(1), unless the authorization is terminated  or revoked sooner.       Influenza A by PCR NEGATIVE NEGATIVE Final   Influenza B by PCR NEGATIVE NEGATIVE Final    Comment: (NOTE) The Xpert Xpress SARS-CoV-2/FLU/RSV plus assay is intended as an aid in the diagnosis of influenza from Nasopharyngeal swab specimens and should not be used as a sole basis for treatment. Nasal washings and aspirates are unacceptable for Xpert Xpress SARS-CoV-2/FLU/RSV testing.  Fact Sheet for Patients: EntrepreneurPulse.com.au  Fact Sheet for Healthcare Providers: IncredibleEmployment.be  This test is not yet approved or cleared by the [place] FDA and has been authorized for detection and/or diagnosis of SARS-CoV-2 by FDA under an Emergency Use Authorization (EUA). This EUA will remain in effect (meaning this test can be used) for the duration of the COVID-19 declaration under Section 564(b)(1) of the Act, 21 U.S.C. section 360bbb-3(b)(1), unless the authorization is terminated or revoked.     Resp Syncytial Virus by PCR NEGATIVE NEGATIVE Final    Comment: (NOTE) Fact Sheet for Patients: EntrepreneurPulse.com.au  Fact Sheet for Healthcare Providers: IncredibleEmployment.be  This test is not yet approved or cleared by the [place] FDA and has been authorized for detection and/or diagnosis of SARS-CoV-2 by FDA under an Emergency Use Authorization (EUA). This EUA will remain in effect (meaning this test can be used) for the duration of the COVID-19 declaration under Section 564(b)(1) of the Act, 21 U.S.C. section 360bbb-3(b)(1), unless the authorization is terminated  or revoked.  Performed at [facility], [address], [place], [place] [address]   MRSA Next Gen by PCR, Nasal     Status: None   Collection Time: 10/23/22 10:15 AM   Specimen: Nasal Mucosa; Nasal Swab  Result Value Ref Range Status   MRSA by PCR Next Gen NOT DETECTED NOT DETECTED Final    Comment: (NOTE) The GeneXpert MRSA Assay (FDA approved for NASAL specimens only), is one component of a comprehensive MRSA colonization surveillance program. It is not intended to diagnose MRSA infection nor to guide or monitor treatment for MRSA infections. Test performance is not FDA approved in patients less than 58 years old. Performed at [facility], [address], [place], [place] [address]  Scheduled Meds:  allopurinol  100 mg Oral QHS   apixaban  5 mg Oral BID   arformoterol  15 mcg Nebulization BID   atorvastatin  40 mg Oral QHS   budesonide (PULMICORT) nebulizer solution  0.5 mg Nebulization BID   Chlorhexidine Gluconate Cloth  6 each Topical Q0600   diltiazem  30 mg Oral Q6H   ferrous sulfate  325 mg Oral Q breakfast   insulin aspart  0-9 Units Subcutaneous TID WC   ipratropium-albuterol  3 mL Nebulization TID   methylPREDNISolone (SOLU-MEDROL) injection  40 mg Intravenous Q12H   metoprolol tartrate  100 mg Oral BID   multivitamin with minerals  1 tablet Oral Daily   pantoprazole  40 mg Oral BID AC   Continuous Infusions:  Procedures/Studies: CT CHEST WO CONTRAST  Result Date: 10/26/2022 CLINICAL DATA:  Chronic shortness of breath. Acute respiratory failure. EXAM: CT CHEST WITHOUT CONTRAST TECHNIQUE: Multidetector CT imaging of the chest was performed following the standard protocol without IV contrast. RADIATION DOSE REDUCTION: This exam was performed according to the departmental dose-optimization program which includes automated exposure control, adjustment of the mA and/or kV according to patient size and/or use of iterative reconstruction technique. COMPARISON:   02/16/2022. FINDINGS: Cardiovascular: Atherosclerotic calcification of the aorta, aortic valve and coronary arteries. Enlarged pulmonic trunk and heart. No pericardial effusion. Mediastinum/Nodes: No pathologically enlarged mediastinal or axillary lymph nodes. Hilar regions are difficult to definitively evaluate without IV contrast. Esophagus is grossly unremarkable. Lungs/Pleura: Image quality is degraded by respiratory motion and expiratory phase imaging. Moderate collapse/consolidation in both lower lobes obscures a previously seen subpleural left lower lobe nodule. Small bilateral pleural effusions. Airway is unremarkable. Upper Abdomen: Liver, gallbladder and adrenal glands are unremarkable. High and low-attenuation lesions in the kidneys are difficult to fully characterize. No specific follow-up necessary. Spleen, pancreas, stomach and visualized bowel are unremarkable with the exception of a tiny hiatal hernia. Midline ventral hernia repair. No upper abdominal adenopathy. Musculoskeletal: Degenerative changes in the spine. IMPRESSION: 1. Moderate bilateral lower lobe collapse/consolidation may be due to atelectasis and/or aspiration. 2. Previously seen subpleural left lower lobe nodule is obscured. Please refer to that report for further details and follow-up recommendation. 3. Small bilateral pleural effusions. 4. Aortic atherosclerosis (ICD10-I70.0). Coronary artery calcification. 5. Enlarged pulmonic trunk, indicative of pulmonary arterial hypertension. Electronically Signed   By: Lorin Picket M.D.   On: 10/26/2022 16:04   DG CHEST PORT 1 VIEW  Result Date: 10/26/2022 CLINICAL DATA:  [id number] COPD with acute exacerbation ([place]) [id number] EXAM: PORTABLE CHEST 1 VIEW COMPARISON:  October 23, 2022 FINDINGS: The cardiomediastinal silhouette is unchanged in contour. Favored trace bilateral pleural effusions. No pneumothorax. Improved aeration in the RIGHT lung. Persistent scattered bibasilar opacities,  likely atelectasis. Central vascular congestion. IMPRESSION: Improved aeration in the RIGHT lung. Persistent bibasilar atelectasis and trace bilateral pleural effusions. Electronically Signed   By: Valentino Saxon M.D.   On: 10/26/2022 07:51   DG CHEST PORT 1 VIEW  Result Date: 10/23/2022 CLINICAL DATA:  COPD EXAM: PORTABLE CHEST 1 VIEW COMPARISON:  Two days ago FINDINGS: Cardiomegaly and vascular pedicle widening. Band of atelectasis along the right minor fissure, lung volumes are low. Diffuse interstitial prominence. No focal consolidation, effusion, or pneumothorax. IMPRESSION: 1. Cardiomegaly and vascular congestion. 2. Worsening lung volumes and increased atelectasis. Electronically Signed   By: Jorje Guild M.D.   On: 10/23/2022 09:25   DG Chest Port 1 View  Result Date: 10/21/2022 CLINICAL DATA:  [id number] CHF (congestive  heart failure) [facility]) [id number] COPD with acute exacerbation ([place]) [id number] EXAM: PORTABLE CHEST 1 VIEW COMPARISON:  October 20, 2022 FINDINGS: The cardiomediastinal silhouette is unchanged in contour. Trace bilateral pleural effusions. No pneumothorax. Chronic bronchitic markings. Perihilar vascular fullness without overt edema. Bibasilar platelike opacities. IMPRESSION: 1. Bibasilar platelike opacities, favored to reflect atelectasis. Favored trace bilateral pleural effusions. 2. Changes compatible with chronic bronchitis. Electronically Signed   By: Valentino Saxon M.D.   On: 10/21/2022 07:51   DG Chest Portable 1 View  Result Date: 10/20/2022 CLINICAL DATA:  Short of breath. EXAM: PORTABLE CHEST 1 VIEW COMPARISON:  Chest 08/18/2022 FINDINGS: Mild cardiac enlargement.  Negative for heart failure. Elevated right hemidiaphragm with mild right lower lobe atelectasis. Left lung clear. No effusion IMPRESSION: Elevated right hemidiaphragm with mild right lower lobe atelectasis. Electronically Signed   By: Franchot Gallo M.D.   On: 10/20/2022 09:33    Orson Eva, DO  [facility]  If 7PM-7AM, please contact night-coverage [email or web address] Password [email or web address] 10/27/2022, 1:10 PM   LOS: 7 days

## 2022-10-28 ENCOUNTER — Other Ambulatory Visit: Payer: Self-pay | Admitting: Cardiology

## 2022-10-28 DIAGNOSIS — I4719 Other supraventricular tachycardia: Secondary | ICD-10-CM | POA: Diagnosis not present

## 2022-10-28 DIAGNOSIS — I5033 Acute on chronic diastolic (congestive) heart failure: Secondary | ICD-10-CM | POA: Diagnosis not present

## 2022-10-28 DIAGNOSIS — I471 Supraventricular tachycardia, unspecified: Secondary | ICD-10-CM

## 2022-10-28 DIAGNOSIS — J441 Chronic obstructive pulmonary disease with (acute) exacerbation: Secondary | ICD-10-CM | POA: Diagnosis not present

## 2022-10-28 DIAGNOSIS — I5189 Other ill-defined heart diseases: Secondary | ICD-10-CM | POA: Diagnosis not present

## 2022-10-28 DIAGNOSIS — N184 Chronic kidney disease, stage 4 (severe): Secondary | ICD-10-CM | POA: Diagnosis not present

## 2022-10-28 DIAGNOSIS — J9601 Acute respiratory failure with hypoxia: Secondary | ICD-10-CM | POA: Diagnosis not present

## 2022-10-28 LAB — GLUCOSE, CAPILLARY
Glucose-Capillary: 153 mg/dL — ABNORMAL HIGH (ref 70–99)
Glucose-Capillary: 216 mg/dL — ABNORMAL HIGH (ref 70–99)
Glucose-Capillary: 274 mg/dL — ABNORMAL HIGH (ref 70–99)
Glucose-Capillary: 190 mg/dL — ABNORMAL HIGH (ref 70–99)

## 2022-10-28 LAB — RENAL FUNCTION PANEL
Albumin: 3.2 g/dL — ABNORMAL LOW (ref 3.5–5.0)
Anion gap: 6 (ref 5–15)
BUN: 86 mg/dL — ABNORMAL HIGH (ref 8–23)
CO2: 29 mmol/L (ref 22–32)
Calcium: 8.6 mg/dL — ABNORMAL LOW (ref 8.9–10.3)
Chloride: 110 mmol/L (ref 98–111)
Creatinine, Ser: 2.61 mg/dL — ABNORMAL HIGH (ref 0.61–1.24)
GFR, Estimated: 24 mL/min — ABNORMAL LOW (ref >60)
Glucose, Bld: 202 mg/dL — ABNORMAL HIGH (ref 70–99)
Phosphorus: 4.2 mg/dL (ref 2.5–4.6)
Potassium: 4.9 mmol/L (ref 3.5–5.1)
Sodium: 145 mmol/L (ref 135–145)

## 2022-10-28 MED ORDER — HYDRALAZINE HCL 25 MG PO TABS
25.0000 mg | ORAL_TABLET | Freq: Four times a day (QID) | ORAL | Status: DC
  Administered 2022-10-28 – 2022-10-30 (×9): 25 mg via ORAL
  Filled 2022-10-28 (×9): qty 1

## 2022-10-28 MED ORDER — AMIODARONE HCL 200 MG PO TABS
200.0000 mg | ORAL_TABLET | Freq: Two times a day (BID) | ORAL | Status: DC
  Administered 2022-10-28 – 2022-10-30 (×5): 200 mg via ORAL
  Filled 2022-10-28 (×5): qty 1

## 2022-10-28 MED ORDER — METHYLPREDNISOLONE SODIUM SUCC 40 MG IJ SOLR
40.0000 mg | Freq: Every day | INTRAMUSCULAR | Status: DC
  Administered 2022-10-29 – 2022-10-30 (×2): 40 mg via INTRAVENOUS
  Filled 2022-10-28 (×2): qty 1

## 2022-10-28 MED ORDER — HYDRALAZINE HCL 20 MG/ML IJ SOLN
20.0000 mg | Freq: Once | INTRAMUSCULAR | Status: AC
  Administered 2022-10-28: 20 mg via INTRAVENOUS
  Filled 2022-10-28: qty 1

## 2022-10-28 MED ORDER — CLONIDINE HCL 0.2 MG PO TABS
0.2000 mg | ORAL_TABLET | Freq: Once | ORAL | Status: AC
  Administered 2022-10-28: 0.2 mg via ORAL
  Filled 2022-10-28: qty 1

## 2022-10-28 MED ORDER — METOPROLOL TARTRATE 50 MG PO TABS
50.0000 mg | ORAL_TABLET | Freq: Two times a day (BID) | ORAL | Status: DC
  Administered 2022-10-28 – 2022-10-30 (×4): 50 mg via ORAL
  Filled 2022-10-28 (×4): qty 1

## 2022-10-28 NOTE — Consult Note (Addendum)
Cardiology Consultation   Patient ID: Andrew Adams MRN: [id number]; DOB: 02/02/1946  Admit date: 10/20/2022 Date of Consult: 10/28/2022  PCP:  Lavella Lemons, [place] Providers Cardiologist:  None       New here Patient Profile:   Andrew Adams is a 77 y.o. male with a hx of HTN, DM-2, CKD baseline Cr 2.2, SVT in 2014, PE 10 years ago and acute PE 08/20/22 on Eliquis , OSA chronic HFpEF and anemia of chronic disease who is being seen 10/28/2022 for the evaluation of SVT, acute on chronic HpEF on pt admitted 10/20/22  at the request of Dr. Carles Collet.  History of Present Illness:   Andrew Adams with hx as above and with acute illness in 2014 SVT.  Now admitted with COPD exacerbation with diastolic HF.  Treated with IV lasix and steroids, nebs.  No chest pain.  Pt admitted he had not been taking lasix regularly recently.   He is followed by Nephrology for CKD 4.  He is on eliquis for PE.  He has HLD on atorvastatin.  His DM is covered by SSI here.   Here pt refuses to wear CPAP.   Echo from 08/20/22 EF 60-65% no RWMA, mod LVH, G2 DD. RV mildly enlarged, but function is normal.  LA mod dilated, RA mildly dilated.  Mild dilatation of ascending aorta measuring 38 mm.  No FH of CAD His mother is 21 years old --on CTA of chest he was noted to have Atherosclerotic calcification of the aorta, aortic valve and coronary arteries.  No chest pain.    He has had paroxysmal SVT and coreg stopped and metoprolol started.  His Cr here 2.61 today and has been up to 3.03 his baseline is 2.2.   Pt not aware of the tachycardia.   BNP yesterday 259, 7 days ago 314  Hgb 10.3 WBC 3.5 plts 147 On admit HS troponin 7 and 8   BP 170/103 TO 142/67 p 70S r 21-24   He is neg 13,055 since admit with wt change from 105.5 to 97.9 Kg  (loss of 16.72 lbs)  last dose of lasix 10/22/22 He is on dilt 30 mg every 6 hours.for SVT  and lopressor 100 BID   Past Medical History:  Diagnosis Date   Anemia     Borderline diabetic    Chronic kidney disease    Diabetes mellitus without complication ([place])    Duodenal adenoma    2021   GERD (gastroesophageal reflux disease)    Gout    Gouty arthritis    H/O small bowel obstruction    History of back surgery    Hx of adenomatous colonic polyps    2021   Hyperlipidemia    Hypertension    PE (pulmonary embolism)    Sleep apnea     Past Surgical History:  Procedure Laterality Date   ABDOMINAL SURGERY  2103   Smal bowel obstruction    ABDOMINAL SURGERY  1981   Perforated large intestine   back     Lower back   BIOPSY  08/28/2020   Procedure: BIOPSY;  Surgeon: Harvel Quale, MD;  [facility];  Service: Gastroenterology;;   BIOPSY  02/07/2022   Procedure: BIOPSY;  Surgeon: Irving Copas., MD;  Location: [facility];  Service: Gastroenterology;;   CATARACT EXTRACTION W/PHACO Right 01/06/2020   Procedure: CATARACT EXTRACTION PHACO AND INTRAOCULAR LENS PLACEMENT (IOC) (CDE: 6.48);  Surgeon: [name],  MD;  Location: [facility];  Service: Ophthalmology;  Laterality: Right;   CATARACT EXTRACTION W/PHACO Left 01/20/2020   Procedure: CATARACT EXTRACTION PHACO AND INTRAOCULAR LENS PLACEMENT (IOC);  Surgeon: [name], MD;  Location: [facility];  Service: Ophthalmology;  Laterality: Left;  CDE: 5.86   COLONOSCOPY WITH PROPOFOL N/A 08/28/2020   Procedure: COLONOSCOPY WITH PROPOFOL;  Surgeon: Harvel Quale, MD;  [facility];  Service: Gastroenterology;  Laterality: N/A;  230   ENDOSCOPIC MUCOSAL RESECTION N/A 02/07/2022   Procedure: ENDOSCOPIC MUCOSAL RESECTION;  Surgeon: Rush Landmark Telford Nab., MD;  Location: [facility];  Service: Gastroenterology;  Laterality: N/A;   ESOPHAGOGASTRODUODENOSCOPY N/A 02/07/2022   Procedure: ESOPHAGOGASTRODUODENOSCOPY (EGD);  Surgeon: Irving Copas., MD;  Location: [facility];  Service: Gastroenterology;  Laterality: N/A;   ESOPHAGOGASTRODUODENOSCOPY  (EGD) WITH PROPOFOL N/A 08/28/2020   Procedure: ESOPHAGOGASTRODUODENOSCOPY (EGD) WITH PROPOFOL;  Surgeon: Harvel Quale, MD;  [facility];  Service: Gastroenterology;  Laterality: N/A;   ESOPHAGOGASTRODUODENOSCOPY (EGD) WITH PROPOFOL N/A 12/14/2020   Procedure: ESOPHAGOGASTRODUODENOSCOPY (EGD) WITH PROPOFOL;  Surgeon: Rush Landmark Telford Nab., MD;  Location: [facility];  Service: Gastroenterology;  Laterality: N/A;   GIVENS CAPSULE STUDY N/A 09/10/2020   Procedure: GIVENS CAPSULE STUDY;  Surgeon: Harvel Quale, MD;  [facility];  Service: Gastroenterology;  Laterality: N/A;  [place]  12/14/2020   Procedure: HEMOSTASIS CLIP PLACEMENT;  Surgeon: Irving Copas., MD;  Location: [facility];  Service: Gastroenterology;;   HEMOSTASIS CLIP PLACEMENT  02/07/2022   Procedure: HEMOSTASIS CLIP PLACEMENT;  Surgeon: Irving Copas., MD;  Location: [facility];  Service: Gastroenterology;;   HEMOSTASIS CONTROL  12/14/2020   Procedure: HEMOSTASIS CONTROL;  Surgeon: Irving Copas., MD;  Location: [facility];  Service: Gastroenterology;;   HERNIA REPAIR     incisional   HOT HEMOSTASIS N/A 02/07/2022   Procedure: HOT HEMOSTASIS (ARGON PLASMA COAGULATION/BICAP);  Surgeon: Irving Copas., MD;  Location: [facility];  Service: Gastroenterology;  Laterality: N/A;   POLYPECTOMY  08/28/2020   Procedure: POLYPECTOMY;  Surgeon: Harvel Quale, MD;  [facility];  Service: Gastroenterology;;   POLYPECTOMY  12/14/2020   Procedure: POLYPECTOMY;  Surgeon: Irving Copas., MD;  Location: [facility];  Service: Gastroenterology;;   Clide Deutscher  12/14/2020   Procedure: Clide Deutscher;  Surgeon: Mansouraty, Telford Nab., MD;  Location: [facility];  Service: Gastroenterology;;   Maryagnes Amos INJECTION  02/07/2022   Procedure: SUBMUCOSAL LIFTING INJECTION;  Surgeon: Irving Copas., MD;  Location: [facility];  Service: Gastroenterology;;   SUBMUCOSAL TATTOO INJECTION  12/14/2020   Procedure: SUBMUCOSAL TATTOO INJECTION;  Surgeon: Irving Copas., MD;  Location: [facility];  Service: Gastroenterology;;     Home Medications:  Prior to Admission medications   Medication Sig Start Date End Date Taking? Authorizing Provider  allopurinol (ZYLOPRIM) 100 MG tablet Take 1 tablet (100 mg total) by mouth daily. Patient taking differently: Take 100 mg by mouth at bedtime. 02/24/14  Yes Martin, Mary-Margaret, FNP  amLODipine (NORVASC) 10 MG tablet Take 10 mg by mouth daily. 12/20/19  Yes [provider]  apixaban (ELIQUIS) 5 MG TABS tablet Take 2 tablets (10 mg total) by mouth 2 (two) times daily. On 08/27/22, start 5 mg  (1 tab) two times daily 08/20/22  Yes Tat, David, MD  atorvastatin (LIPITOR) 40 MG tablet Take 1 tablet (40 mg total) by mouth daily. Patient taking differently: Take 40 mg by mouth at bedtime. 02/24/14  Yes  Hassell Done, Mary-Margaret, FNP  carvedilol (COREG) 12.5 MG tablet Take 12.5 mg by mouth 2 (two) times daily with a meal.   Yes [provider]  epoetin alfa-epbx (RETACRIT) 3000 UNIT/ML injection Inject 3,000 Units into the vein every 14 (fourteen) days.    Yes [provider]  FEROSUL 325 (65 Fe) MG tablet TAKE ONE TABLET BY MOUTH EVERY MORNING WITH BREAKFAST Patient taking differently: Take 325 mg by mouth daily with breakfast. 12/20/21  Yes Carlan, Chelsea L, NP  ipratropium-albuterol (DUONEB) 0.5-2.5 (3) MG/3ML SOLN Take 3 mLs by nebulization every 6 (six) hours. Patient taking differently: Take 3 mLs by nebulization every 4 (four) hours as needed. 08/20/22  Yes Tat, Shanon Brow, MD  Multiple Vitamin (MULTIVITAMIN WITH MINERALS) TABS tablet Take 1 tablet by mouth daily. 02/16/20  Yes Johnson, Clanford L, MD  pantoprazole (PROTONIX) 40 MG tablet Take 1 tablet (40 mg total) by mouth 2 (two) times daily before a meal. 02/07/22  Yes  Mansouraty, Telford Nab., MD  potassium chloride (KLOR-CON) 10 MEQ tablet Take 10 mEq by mouth daily. 07/18/22  Yes [provider]  TRULICITY 1.32 GM/0.1UU SOPN Inject 0.75 mg into the skin every Friday.   Yes [provider]  valsartan (DIOVAN) 40 MG tablet Take 20 mg by mouth daily. 10/11/21  Yes [provider]  furosemide (LASIX) 40 MG tablet Take 40 mg by mouth 2 (two) times daily. Patient not taking: Reported on 10/20/2022    [provider]    Inpatient Medications: Scheduled Meds:  allopurinol  100 mg Oral QHS   apixaban  5 mg Oral BID   arformoterol  15 mcg Nebulization BID   atorvastatin  40 mg Oral QHS   budesonide (PULMICORT) nebulizer solution  0.5 mg Nebulization BID   Chlorhexidine Gluconate Cloth  6 each Topical Q0600   diltiazem  30 mg Oral Q6H   ferrous sulfate  325 mg Oral Q breakfast   hydrALAZINE  25 mg Oral Q6H   insulin aspart  0-9 Units Subcutaneous TID WC   ipratropium-albuterol  3 mL Nebulization TID   methylPREDNISolone (SOLU-MEDROL) injection  40 mg Intravenous Q12H   metoprolol tartrate  100 mg Oral BID   multivitamin with minerals  1 tablet Oral Daily   pantoprazole  40 mg Oral BID AC   Continuous Infusions:  PRN Meds: acetaminophen **OR** acetaminophen, bisacodyl, fentaNYL (SUBLIMAZE) injection, guaiFENesin-dextromethorphan, labetalol, ondansetron **OR** ondansetron (ZOFRAN) IV, oxyCODONE, traZODone  Allergies:   No Known Allergies  Social History:   Social History   Socioeconomic History   Marital status: Married    Spouse name: Not on file   Number of children: 1   Years of education: Not on file   Highest education level: Not on file  Occupational History   Occupation: Finisher  Tobacco Use   Smoking status: Former    Packs/day: 1.00    Years: 38.00    Total pack years: 38.00    Types: Cigarettes    Quit date: 02/07/1995    Years since quitting: 27.7   Smokeless tobacco: Never  Vaping Use   Vaping  Use: Never used  Substance and Sexual Activity   Alcohol use: Yes    Alcohol/week: 2.0 standard drinks of alcohol    Types: 2 Shots of liquor per week    Comment: occasional    Drug use: Yes    Types: Marijuana   Sexual activity: Yes  Other Topics Concern   Not on file  Social History Narrative  Lives with wife.     Social Determinants of Health   Financial Resource Strain: Not on file  Food Insecurity: No Food Insecurity (10/20/2022)   Hunger Vital Sign    Worried About Running Out of Food in the Last Year: Never true    Ran Out of Food in the Last Year: Never true  Transportation Needs: No Transportation Needs (10/20/2022)   PRAPARE - Hydrologist (Medical): No    Lack of Transportation (Non-Medical): No  Physical Activity: Not on file  Stress: Not on file  Social Connections: Not on file  Intimate Partner Violence: Not At Risk (10/20/2022)   Humiliation, Afraid, Rape, and Kick questionnaire    Fear of Current or Ex-Partner: No    Emotionally Abused: No    Physically Abused: No    Sexually Abused: No    Family History:    Family History  Problem Relation Age of Onset   Hypertension Sister    Diabetes Sister    Diabetes Sister    Hypertension Mother    Diabetes Mother    Hypertension Father    Ulcers Father    Colon cancer Neg Hx    Esophageal cancer Neg Hx    Stomach cancer Neg Hx    Inflammatory bowel disease Neg Hx    Liver disease Neg Hx    Pancreatic cancer Neg Hx    Rectal cancer Neg Hx      ROS:  Please see the history of present illness.  General:no colds or fevers, + weight changes with diuresis Skin:no rashes or ulcers HEENT:no blurred vision, no congestion CV:see HPI PUL:see HPI  hx acute PE 07/2022, remote PE 10 years ago GI:no diarrhea constipation or melena, no indigestion, though hx of GERD GU:no hematuria, no dysuria MS:no joint pain, no claudication Neuro:no syncope, no lightheadedness Endo:+ diabetes, no  thyroid disease Heme : chronic anemia  Nephrology: + CKD IV baseline Cr 2.2   All other ROS reviewed and negative.     Physical Exam/Data:   Vitals:   10/28/22 0500 10/28/22 0530 10/28/22 0626 10/28/22 0700  BP: (!) 141/76  (!) 170/103 (!) 147/78  Pulse: (!) 126 72  71  Resp: (!) 28 16  (!) 21  Temp:      TempSrc:      SpO2: 98% 99%  99%  Weight:      Height:        Intake/Output Summary (Last 24 hours) at 10/28/2022 0759 Last data filed at 10/28/2022 0345 Gross per 24 hour  Intake 940 ml  Output 2985 ml  Net -2045 ml      10/28/2022    3:43 AM 10/27/2022    5:00 AM 10/26/2022    5:00 AM  Last 3 Weights  Weight (lbs) 215 lb 13.3 oz 223 lb 12.3 oz 223 lb 8.7 oz  Weight (kg) 97.9 kg 101.5 kg 101.4 kg     Body mass index is 31.87 kg/m.  General:  Well nourished, well developed, in no acute distress having nebulizer treatment  HEENT: normal Neck: no JVD sitting up with meal Vascular: No carotid bruits; Distal pulses 2+ bilaterally Cardiac:  normal S1, S2; RRR; no murmur gallup rub or click Lungs:  rhonchi to auscultation bilaterally, no wheezing or rales  Abd: soft, nontender, no hepatomegaly  Ext: 1+ edema lower ext ankles and feet Musculoskeletal:  No deformities, BUE and BLE strength normal and equal Skin: warm and dry  Neuro:  alert and  oriented X 3 MAE follows commands, no focal abnormalities noted Psych:  Normal affect   EKG:  The EKG was personally reviewed and demonstrates:  from 10/26/22 SVT at 133, no acute ST changes  admit EKG 10/20/22 SR with borderline 1st degree AV block and non specific ST changes Telemetry:  Telemetry was personally reviewed and demonstrates:  SR episodes of SVt  Relevant CV Studies: ECHO 08/20/22 IMPRESSIONS     1. Left ventricular ejection fraction, by estimation, is 60 to 65%. The  left ventricle has normal function. The left ventricle has no regional  wall motion abnormalities. There is moderate left ventricular  hypertrophy.  Left ventricular diastolic  parameters are consistent with Grade II diastolic dysfunction  (pseudonormalization). Elevated left atrial pressure.   2. Right ventricular systolic function is normal. The right ventricular  size is mildly enlarged. There is mildly elevated pulmonary artery  systolic pressure.   3. Left atrial size was moderately dilated.   4. Right atrial size was mildly dilated.   5. The mitral valve is normal in structure. No evidence of mitral valve  regurgitation. No evidence of mitral stenosis.   6. The aortic valve is tricuspid. Aortic valve regurgitation is not  visualized. Aortic valve sclerosis is present, with no evidence of aortic  valve stenosis.   7. Aortic dilatation noted. There is mild dilatation of the ascending  aorta, measuring 38 mm.   FINDINGS   Left Ventricle: Left ventricular ejection fraction, by estimation, is 60  to 65%. The left ventricle has normal function. The left ventricle has no  regional wall motion abnormalities. The left ventricular internal cavity  size was normal in size. There is   moderate left ventricular hypertrophy. Left ventricular diastolic  parameters are consistent with Grade II diastolic dysfunction  (pseudonormalization). Elevated left atrial pressure.   Right Ventricle: The right ventricular size is mildly enlarged. No  increase in right ventricular wall thickness. Right ventricular systolic  function is normal. There is mildly elevated pulmonary artery systolic  pressure. The tricuspid regurgitant  velocity is 3.04 m/s, and with an assumed right atrial pressure of 8 mmHg,  the estimated right ventricular systolic pressure is 79.3 mmHg.   Left Atrium: Left atrial size was moderately dilated.   Right Atrium: Right atrial size was mildly dilated.   Pericardium: Trivial pericardial effusion is present.   Mitral Valve: The mitral valve is normal in structure. No evidence of  mitral valve regurgitation. No  evidence of mitral valve stenosis. MV peak  gradient, 5.0 mmHg. The mean mitral valve gradient is 2.0 mmHg.   Tricuspid Valve: The tricuspid valve is normal in structure. Tricuspid  valve regurgitation is trivial.   Aortic Valve: The aortic valve is tricuspid. Aortic valve regurgitation is  not visualized. Aortic valve sclerosis is present, with no evidence of  aortic valve stenosis. Aortic valve mean gradient measures 7.5 mmHg.  Aortic valve peak gradient measures  15.3 mmHg. Aortic valve area, by VTI measures 2.31 cm.   Pulmonic Valve: The pulmonic valve was not well visualized. Pulmonic valve  regurgitation is not visualized.   Aorta: The aortic root is normal in size and structure and aortic  dilatation noted. There is mild dilatation of the ascending aorta,  measuring 38 mm.   IAS/Shunts: The interatrial septum was not well visualized.       Laboratory Data:  High Sensitivity Troponin:   Recent Labs  Lab 10/20/22 0901 10/20/22 1029  TROPONINIHS 8 7     Chemistry  Recent Labs  Lab 10/24/22 0358 10/25/22 0334 10/26/22 0421 10/27/22 0421 10/27/22 0422 10/28/22 0415  NA 136   < > 146* 147* 146* 145  K 5.0   < > 5.0 4.6 4.7 4.9  CL 100   < > 108 109 108 110  CO2 27   < > 31 32 32 29  GLUCOSE 155*   < > 170* 183* 182* 202*  BUN 77*   < > 97* 94* 93* 86*  CREATININE 3.28*   < > 3.03* 2.75* 2.69* 2.61*  CALCIUM 8.0*   < > 8.6* 8.5* 8.6* 8.6*  MG 2.4  --  2.7*  --  2.4  --   GFRNONAA 19*   < > 20* 23* 23* 24*  ANIONGAP 9   < > '7 6 6 6   '$ < > = values in this interval not displayed.    Recent Labs  Lab 10/26/22 0421 10/27/22 0421 10/28/22 0415  ALBUMIN 3.1* 3.2* 3.2*   Lipids No results for input(s): "CHOL", "TRIG", "HDL", "LABVLDL", "LDLCALC", "CHOLHDL" in the last 168 hours.  Hematology Recent Labs  Lab 10/24/22 0358 10/26/22 0421  WBC 6.0 3.5*  RBC 3.76* 3.65*  HGB 10.5* 10.3*  HCT 33.7* 31.9*  MCV 89.6 87.4  MCH 27.9 28.2  MCHC 31.2 32.3  RDW  13.3 14.0  PLT 152 147*   Thyroid No results for input(s): "TSH", "FREET4" in the last 168 hours.  BNP Recent Labs  Lab 10/27/22 0421  BNP 259.0*    DDimer No results for input(s): "DDIMER" in the last 168 hours.   Radiology/Studies:  CT CHEST WO CONTRAST  Result Date: 10/26/2022 CLINICAL DATA:  Chronic shortness of breath. Acute respiratory failure. EXAM: CT CHEST WITHOUT CONTRAST TECHNIQUE: Multidetector CT imaging of the chest was performed following the standard protocol without IV contrast. RADIATION DOSE REDUCTION: This exam was performed according to the departmental dose-optimization program which includes automated exposure control, adjustment of the mA and/or kV according to patient size and/or use of iterative reconstruction technique. COMPARISON:  02/16/2022. FINDINGS: Cardiovascular: Atherosclerotic calcification of the aorta, aortic valve and coronary arteries. Enlarged pulmonic trunk and heart. No pericardial effusion. Mediastinum/Nodes: No pathologically enlarged mediastinal or axillary lymph nodes. Hilar regions are difficult to definitively evaluate without IV contrast. Esophagus is grossly unremarkable. Lungs/Pleura: Image quality is degraded by respiratory motion and expiratory phase imaging. Moderate collapse/consolidation in both lower lobes obscures a previously seen subpleural left lower lobe nodule. Small bilateral pleural effusions. Airway is unremarkable. Upper Abdomen: Liver, gallbladder and adrenal glands are unremarkable. High and low-attenuation lesions in the kidneys are difficult to fully characterize. No specific follow-up necessary. Spleen, pancreas, stomach and visualized bowel are unremarkable with the exception of a tiny hiatal hernia. Midline ventral hernia repair. No upper abdominal adenopathy. Musculoskeletal: Degenerative changes in the spine. IMPRESSION: 1. Moderate bilateral lower lobe collapse/consolidation may be due to atelectasis and/or aspiration. 2.  Previously seen subpleural left lower lobe nodule is obscured. Please refer to that report for further details and follow-up recommendation. 3. Small bilateral pleural effusions. 4. Aortic atherosclerosis (ICD10-I70.0). Coronary artery calcification. 5. Enlarged pulmonic trunk, indicative of pulmonary arterial hypertension. Electronically Signed   By: Lorin Picket M.D.   On: 10/26/2022 16:04   DG CHEST PORT 1 VIEW  Result Date: 10/26/2022 CLINICAL DATA:  [id number] COPD with acute exacerbation ([place]) [id number] EXAM: PORTABLE CHEST 1 VIEW COMPARISON:  October 23, 2022 FINDINGS: The cardiomediastinal silhouette is unchanged in contour. Favored trace bilateral pleural effusions.  No pneumothorax. Improved aeration in the RIGHT lung. Persistent scattered bibasilar opacities, likely atelectasis. Central vascular congestion. IMPRESSION: Improved aeration in the RIGHT lung. Persistent bibasilar atelectasis and trace bilateral pleural effusions. Electronically Signed   By: Valentino Saxon M.D.   On: 10/26/2022 07:51     Assessment and Plan:   SVT in combination with acute COPD exacerbation - will add po amiodarone at this time.  Continue BB at lower dose and continue po dilt.  In future will need to see EP for ablation. EF is 60-65% in Oct  COPD exacerbation per IM with Acute hypoxic and hypercarbic respiratory failure. On steroids nebs and doxy. Acute on chronic HFpEF - lasix on hold, pt with chronic lower ext edema is neg 13L since admit with 16 lb wt loss HTN labile, with po dilt his amlodipine stopped.  On BB, hydralazine, clonidine  CKD 4, chronic -did have bump in Cr with diuresis. Now near stable. DM-2,PE on eliquis, acquired thrombophilia per IM HLD on statin OSA has not been wearing CPAP at home, it is not comfortable. 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 Exam with black male in no distress eating breakfast Exp wheezing with rhonchi no murmur plus one edema. During interview with short bursts of long RP tachycardia. Totally asymptomatic Has been on cardizem and lopressor Add amiodarone 200 mg bid and lower lopressor dose. Will need outpatient EP evaluation as this is a rhythm that can be ablated. Retrograde P waves noted on telemetry. Continue Rx of respiratory failure as hypoxia is likely driving force for arrhythmia EF normal diuresed well follow K/Mg Component of diastolic CHF with moderate LVH.   Jenkins Rouge MD [facility]

## 2022-10-28 NOTE — Progress Notes (Signed)
Physical Therapy Treatment Patient Details Name: Andrew Adams MRN: [id number] DOB: Feb 23, 1946 Today's Date: 10/28/2022   History of Present Illness Andrew Adams is a 76 year old gentleman with stage IV CKD, hypertension, type 2 diabetes mellitus, pulmonary embolus on apixaban, OSA, diastolic heart failure presented to the emergency department complaining of progressive shortness of breath and increasing edema in bilateral lower extremities over the past week.  He has been coughing and wheezing frequently increasing in sputum production and malaise.  He has been using his home bronchodilators but does not seem to be able to control the symptoms.  He denies fever and chills.  He denies nausea vomiting and diarrhea.  He was noted in the ED to have 3+ pitting edema bilateral lower extremities and diffuse wheezing.  He was started on IV Lasix and treatment for acute COPD exacerbation.  Patient reports no chest pain symptoms.  Patient reports he may not have been taking his Lasix regularly in the last several weeks.  He is being admitted for further management.    PT Comments    Patient demonstrates increased endurance/distance for gait training in room with requiring use of AD, no loss of balance, but required supplemental O2 due SpO2 dropping to 86% on room, required 2 LPM O2 with SpO2 averaging 92% with occasional instruction in pursed lipped breathing - nurse notified.  Patient tolerated staying up in chair after therapy.  Patient will benefit from continued skilled physical therapy in hospital and recommended venue below to increase strength, balance, endurance for safe ADLs and gait.     Recommendations for follow up therapy are one component of a multi-disciplinary discharge planning process, led by the attending physician.  Recommendations may be updated based on patient status, additional functional criteria and insurance authorization.  Follow Up Recommendations  Home health PT Can patient  physically be transported by private vehicle: Yes   Assistance Recommended at Discharge Set up Supervision/Assistance  Patient can return home with the following A little help with walking and/or transfers;A little help with bathing/dressing/bathroom;Help with stairs or ramp for entrance;Assistance with cooking/housework   Equipment Recommendations  None recommended by PT    Recommendations for Other Services       Precautions / Restrictions Precautions Precautions: None Restrictions Weight Bearing Restrictions: No     Mobility  Bed Mobility               General bed mobility comments: Patient present seated in chair (assisted by nursing staff)    Transfers Overall transfer level: Needs assistance Equipment used: None Transfers: Sit to/from Stand, Bed to chair/wheelchair/BSC Sit to Stand: Supervision   Step pivot transfers: Supervision       General transfer comment: good return for completing sit to stands, transferring to chair without use of an AD    Ambulation/Gait Ambulation/Gait assistance: Supervision Gait Distance (Feet): 75 Feet Assistive device: None Gait Pattern/deviations: Decreased step length - right, Decreased step length - left, Decreased stride length Gait velocity: decreased     General Gait Details: slightly labored movement ambulating in room for up to 8 minutes without requiring use of AD, no loss of balance and SpO2 at 91-83% while on 2 LPM   Stairs             Wheelchair Mobility    Modified Rankin (Stroke Patients Only)       Balance Overall balance assessment: Needs assistance Sitting-balance support: Feet supported, No upper extremity supported Sitting balance-Leahy Scale: Good Sitting balance - Comments:  seated in chair   Standing balance support: During functional activity, No upper extremity supported Standing balance-Leahy Scale: Fair Standing balance comment: fair/good without use of an AD                             Cognition Arousal/Alertness: Awake/alert Behavior During Therapy: WFL for tasks assessed/performed Overall Cognitive Status: Within Functional Limits for tasks assessed                                          Exercises General Exercises - Lower Extremity Long Arc Quad: Seated, AROM, Strengthening, Both, 10 reps Hip Flexion/Marching: Seated, AROM, Strengthening, Both, 10 reps Toe Raises: Seated, AROM, Strengthening, Both, 10 reps Heel Raises: Seated, AROM, Strengthening, Both, 10 reps    General Comments        Pertinent Vitals/Pain Pain Assessment Pain Assessment: No/denies pain    Home Living                          Prior Function            PT Goals (current goals can now be found in the care plan section) Acute Rehab PT Goals Patient Stated Goal: return home PT Goal Formulation: With patient Time For Goal Achievement: 11/09/22 Potential to Achieve Goals: Good Progress towards PT goals: Progressing toward goals    Frequency    Min 3X/week      PT Plan Discharge plan needs to be updated    Co-evaluation              AM-PAC PT "6 Clicks" Mobility   Outcome Measure  Help needed turning from your back to your side while in a flat bed without using bedrails?: A Little Help needed moving from lying on your back to sitting on the side of a flat bed without using bedrails?: A Little Help needed moving to and from a bed to a chair (including a wheelchair)?: A Little Help needed standing up from a chair using your arms (e.g., wheelchair or bedside chair)?: None Help needed to walk in hospital room?: A Little Help needed climbing 3-5 steps with a railing? : A Little 6 Click Score: 19    End of Session Equipment Utilized During Treatment: Oxygen Activity Tolerance: Patient tolerated treatment well;Patient limited by fatigue Patient left: in chair;with call bell/phone within reach Nurse Communication:  Mobility status PT Visit Diagnosis: Unsteadiness on feet (R26.81);Other abnormalities of gait and mobility (R26.89);Muscle weakness (generalized) (M62.81)     Time: 7341-9379 PT Time Calculation (min) (ACUTE ONLY): 20 min  Charges:  $Gait Training: 8-22 mins $Therapeutic Exercise: 8-22 mins                     3:40 PM, 10/28/22 Lonell Grandchild, MPT Physical Therapist with [facility] 336 [phone] office [phone] mobile phone

## 2022-10-28 NOTE — Progress Notes (Signed)
  X-cover Note: BP remained high most of the night despite multiple round of IV labetalol. 1 dose of 0.2 mg clonidine did not reduce BP. 20 mg IV hydralazine given with good reduction in BP. Suspect spike in pt's BP due to steroid use.  Have started po hydralazine 25 mg q6h for now. Adjust according to pt's BP.   Kristopher Oppenheim, DO [facility]

## 2022-10-28 NOTE — Progress Notes (Signed)
Palliative:  I have reviewed records and note some level of improvement. Recommend outpatient palliative referral given significant underlying comorbidities. If he declines and still hospitalized I will plan to see 11/01/22.   No charge  Vinie Sill, NP Palliative Medicine Team Pager [phone] (Please see [email or web address] for schedule) Team Phone [phone]

## 2022-10-28 NOTE — Care Management Important Message (Signed)
Important Message  Patient Details  Name: Andrew Adams MRN: [id number] Date of Birth: [birth year]   Medicare Important Message Given:  Yes     Tommy Medal 10/28/2022, 3:43 PM

## 2022-10-28 NOTE — Progress Notes (Signed)
PROGRESS NOTE  Andrew Adams [id number] DOB: 04/08/1946 DOA: 10/20/2022 PCP: Lavella Lemons, PA  Brief History:  76 year old gentleman with stage IV CKD, hypertension, type 2 diabetes mellitus, pulmonary embolus on apixaban, OSA, diastolic heart failure presented to the emergency department complaining of progressive shortness of breath and increasing edema in bilateral lower extremities over the past week.  He has been coughing and wheezing frequently increasing in sputum production and malaise.  He has been using his home bronchodilators but does not seem to be able to control the symptoms.  He denies fever and chills.  He denies nausea vomiting and diarrhea.  He was noted in the ED to have 3+ pitting edema bilateral lower extremities and diffuse wheezing.  He was started on IV Lasix and treatment for acute COPD exacerbation.  Patient reports no chest pain symptoms.  Patient reports he may not have been taking his Lasix regularly in the last several weeks.  He is being admitted for further management.   Assessment/Plan: COPD with acute exacerbation  - continue scheduled bronchodilators - doxycycline 100 mg BID - mucinex 1200 mg BID  - added pulmicort - added brovana - supplemental oxygen--2L - RT consult for resp support  - flutter valve -continue IV solumedrol BID>>decrease to once daily -12/24: noted to be somnolent --->ABG ordered; pH 7.14, pCO2 98 -bipap started, transferred to stepdown ICU -now weaned back to 2L   Acute hypoxic and hypercarbic respiratory failure -pt has consistently refused to wear nightly CPAP -due to COPD exacerbation and pulm edema -noted in AM 12/24 to be confused/somnolent; pCO2 98, pH 7.14 -started on bipap 12/24 with improvement in mentation -continue nightly bipap  -now stable on 2L   Paroxysmal SVT -d/c coreg -started metoprolol -start diltiazem -check TSH -personally reviewed EKG--SVT, nonspecific ST change - 08/20/22 Echo EF  60-65%, no WMA, G2DD, normal RVF -cardiology consult appreciated>>start amio, may need EP eval   Acute on chronic HFpEF - treated with IV lasix; hold lasix  with uptrend creatinine - monitor weight, I/O,--NEG 18 lbs - 08/20/22 Echo EF 60-65%, no WMA, G2DD, normal RVF - obtain ReDS Vest reading--40   Essential hypertension  - resume home meds EXCEPT amlodipine -continue diltiazem, metoprolol -added amio   AKI on Stage IV CKD - he is followed by Dr. Theador Hawthorne (nephrologist) - he will need to follow up with Dr. Theador Hawthorne as an outpatient  - d/c ARB -baseline creatinine 2.1-2.4 -serum creatinine peakd 3.70 - hold lasix due to uptrend in serum creatinine>>improving   Controlled diabetes mellitus type 2 -Holding Trulicity>>restart after d/c -08/19/22 hemoglobin A1c--6.4 -NovoLog sliding scale   Acquired Thrombophilia - apixaban 5 mg BID has been resumed   PE - resumed home apixaban as above    Hyperlipidemia - resumed home atorvastatin daily    OSA -nightly CPAP refused by patient -pt claims he will start wearing CPAP tonight    Dilated ascending aorta -incidental finding on echo 08/20/22 -measured 38 mm on echo -will need outpatient surveilance     Family Communication:   no Family at bedside   Consultants:  none   Code Status:  FULL    DVT Prophylaxis:apixaban     Procedures: As Listed in Progress Note Above   Antibiotics: None            Subjective:  Patient denies fevers, chills, headache, chest pain, dyspnea, nausea, vomiting, diarrhea, abdominal pain, dysuria, hematuria, hematochezia, and melena.   Objective:  Vitals:   10/28/22 1200 10/28/22 1400 10/28/22 1428 10/28/22 1500  BP:  (!) 167/94  (!) 185/84  Pulse:  74  (!) 120  Resp:  14  (!) 22  Temp: 99.2 F (37.3 C)     TempSrc: Oral     SpO2:  93% 98% 95%  Weight:      Height:        Intake/Output Summary (Last 24 hours) at 10/28/2022 1550 Last data filed at 10/28/2022 0802 Gross  per 24 hour  Intake 240 ml  Output 2135 ml  Net -1895 ml   Weight change: -3.6 kg Exam:  General:  Pt is alert, follows commands appropriately, not in acute distress HEENT: No icterus, No thrush, No neck mass, [place]/AT Cardiovascular: RRR, S1/S2, no rubs, no gallops Respiratory: bibasilar rales. No wheeze Abdomen: Soft/+BS, non tender, non distended, no guarding Extremities: trace LE edema, No lymphangitis, No petechiae, No rashes, no synovitis   Data Reviewed: I have personally reviewed following labs and imaging studies Basic Metabolic Panel: Recent Labs  Lab 10/24/22 0358 10/25/22 0334 10/26/22 0421 10/27/22 0421 10/27/22 0422 10/28/22 0415  NA 136 142 146* 147* 146* 145  K 5.0 4.8 5.0 4.6 4.7 4.9  CL 100 105 108 109 108 110  CO2 '27 30 31 '$ 32 32 29  GLUCOSE 155* 158* 170* 183* 182* 202*  BUN 77* 88* 97* 94* 93* 86*  CREATININE 3.28* 2.82* 3.03* 2.75* 2.69* 2.61*  CALCIUM 8.0* 8.5* 8.6* 8.5* 8.6* 8.6*  MG 2.4  --  2.7*  --  2.4  --   PHOS  --  4.4 4.2 4.1 4.1 4.2   Liver Function Tests: Recent Labs  Lab 10/25/22 0334 10/26/22 0421 10/27/22 0421 10/28/22 0415  ALBUMIN 3.4* 3.1* 3.2* 3.2*   No results for input(s): "LIPASE", "AMYLASE" in the last 168 hours. No results for input(s): "AMMONIA" in the last 168 hours. Coagulation Profile: No results for input(s): "INR", "PROTIME" in the last 168 hours. CBC: Recent Labs  Lab 10/24/22 0358 10/26/22 0421  WBC 6.0 3.5*  HGB 10.5* 10.3*  HCT 33.7* 31.9*  MCV 89.6 87.4  PLT 152 147*   Cardiac Enzymes: No results for input(s): "CKTOTAL", "CKMB", "CKMBINDEX", "TROPONINI" in the last 168 hours. BNP: Invalid input(s): "POCBNP" CBG: Recent Labs  Lab 10/27/22 1105 10/27/22 1601 10/27/22 2159 10/28/22 0731 10/28/22 1155  GLUCAP 166* 184* 136* 153* 216*   HbA1C: No results for input(s): "HGBA1C" in the last 72 hours. Urine analysis:    Component Value Date/Time   COLORURINE YELLOW 08/18/2022 1116    APPEARANCEUR CLEAR 08/18/2022 1116   LABSPEC 1.009 08/18/2022 1116   PHURINE 6.0 08/18/2022 1116   GLUCOSEU NEGATIVE 08/18/2022 1116   [place] 08/18/2022 1116   [place] 08/18/2022 1116   BILIRUBINUR negative 01/20/2014 1032   [place] 08/18/2022 1116   PROTEINUR 100 (A) 08/18/2022 1116   UROBILINOGEN negative 01/20/2014 1032   NITRITE NEGATIVE 08/18/2022 1116   LEUKOCYTESUR TRACE (A) 08/18/2022 1116   Sepsis Labs: '@LABRCNTIP'$ (procalcitonin:4,lacticidven:4) ) Recent Results (from the past 240 hour(s))  Resp panel by RT-PCR (RSV, Flu A&B, Covid) Anterior Nasal Swab     Status: None   Collection Time: 10/20/22  9:08 AM   Specimen: Anterior Nasal Swab  Result Value Ref Range Status   SARS Coronavirus 2 by RT PCR NEGATIVE NEGATIVE Final    Comment: (NOTE) SARS-CoV-2 target nucleic acids are NOT DETECTED.  The SARS-CoV-2 RNA is generally detectable in upper respiratory specimens during  the acute phase of infection. The lowest concentration of SARS-CoV-2 viral copies this assay can detect is 138 copies/mL. A negative result does not preclude SARS-Cov-2 infection and should not be used as the sole basis for treatment or other patient management decisions. A negative result may occur with  improper specimen collection/handling, submission of specimen other than nasopharyngeal swab, presence of viral mutation(s) within the areas targeted by this assay, and inadequate number of viral copies(<138 copies/mL). A negative result must be combined with clinical observations, patient history, and epidemiological information. The expected result is Negative.  Fact Sheet for Patients:  EntrepreneurPulse.com.au  Fact Sheet for Healthcare Providers:  IncredibleEmployment.be  This test is no t yet approved or cleared by the [place] FDA and  has been authorized for detection and/or diagnosis of SARS-CoV-2 by FDA under an  Emergency Use Authorization (EUA). This EUA will remain  in effect (meaning this test can be used) for the duration of the COVID-19 declaration under Section 564(b)(1) of the Act, 21 U.S.C.section 360bbb-3(b)(1), unless the authorization is terminated  or revoked sooner.       Influenza A by PCR NEGATIVE NEGATIVE Final   Influenza B by PCR NEGATIVE NEGATIVE Final    Comment: (NOTE) The Xpert Xpress SARS-CoV-2/FLU/RSV plus assay is intended as an aid in the diagnosis of influenza from Nasopharyngeal swab specimens and should not be used as a sole basis for treatment. Nasal washings and aspirates are unacceptable for Xpert Xpress SARS-CoV-2/FLU/RSV testing.  Fact Sheet for Patients: EntrepreneurPulse.com.au  Fact Sheet for Healthcare Providers: IncredibleEmployment.be  This test is not yet approved or cleared by the [place] FDA and has been authorized for detection and/or diagnosis of SARS-CoV-2 by FDA under an Emergency Use Authorization (EUA). This EUA will remain in effect (meaning this test can be used) for the duration of the COVID-19 declaration under Section 564(b)(1) of the Act, 21 U.S.C. section 360bbb-3(b)(1), unless the authorization is terminated or revoked.     Resp Syncytial Virus by PCR NEGATIVE NEGATIVE Final    Comment: (NOTE) Fact Sheet for Patients: EntrepreneurPulse.com.au  Fact Sheet for Healthcare Providers: IncredibleEmployment.be  This test is not yet approved or cleared by the [place] FDA and has been authorized for detection and/or diagnosis of SARS-CoV-2 by FDA under an Emergency Use Authorization (EUA). This EUA will remain in effect (meaning this test can be used) for the duration of the COVID-19 declaration under Section 564(b)(1) of the Act, 21 U.S.C. section 360bbb-3(b)(1), unless the authorization is terminated or revoked.  Performed at [facility],  [address] [place], [place] [address]   MRSA Next Gen by PCR, Nasal     Status: None   Collection Time: 10/23/22 10:15 AM   Specimen: Nasal Mucosa; Nasal Swab  Result Value Ref Range Status   MRSA by PCR Next Gen NOT DETECTED NOT DETECTED Final    Comment: (NOTE) The GeneXpert MRSA Assay (FDA approved for NASAL specimens only), is one component of a comprehensive MRSA colonization surveillance program. It is not intended to diagnose MRSA infection nor to guide or monitor treatment for MRSA infections. Test performance is not FDA approved in patients less than 91 years old. Performed at [facility], [address], [place],  [address]      Scheduled Meds:  allopurinol  100 mg Oral QHS   amiodarone  200 mg Oral BID   apixaban  5 mg Oral BID   arformoterol  15 mcg Nebulization BID   atorvastatin  40  mg Oral QHS   budesonide (PULMICORT) nebulizer solution  0.5 mg Nebulization BID   Chlorhexidine Gluconate Cloth  6 each Topical Q0600   diltiazem  30 mg Oral Q6H   ferrous sulfate  325 mg Oral Q breakfast   hydrALAZINE  25 mg Oral Q6H   insulin aspart  0-9 Units Subcutaneous TID WC   ipratropium-albuterol  3 mL Nebulization TID   [START ON 10/29/2022] methylPREDNISolone (SOLU-MEDROL) injection  40 mg Intravenous Daily   metoprolol tartrate  50 mg Oral BID   multivitamin with minerals  1 tablet Oral Daily   pantoprazole  40 mg Oral BID AC   Continuous Infusions:  Procedures/Studies: CT CHEST WO CONTRAST  Result Date: 10/26/2022 CLINICAL DATA:  Chronic shortness of breath. Acute respiratory failure. EXAM: CT CHEST WITHOUT CONTRAST TECHNIQUE: Multidetector CT imaging of the chest was performed following the standard protocol without IV contrast. RADIATION DOSE REDUCTION: This exam was performed according to the departmental dose-optimization program which includes automated exposure control, adjustment of the mA and/or kV according to patient size and/or use of iterative  reconstruction technique. COMPARISON:  02/16/2022. FINDINGS: Cardiovascular: Atherosclerotic calcification of the aorta, aortic valve and coronary arteries. Enlarged pulmonic trunk and heart. No pericardial effusion. Mediastinum/Nodes: No pathologically enlarged mediastinal or axillary lymph nodes. Hilar regions are difficult to definitively evaluate without IV contrast. Esophagus is grossly unremarkable. Lungs/Pleura: Image quality is degraded by respiratory motion and expiratory phase imaging. Moderate collapse/consolidation in both lower lobes obscures a previously seen subpleural left lower lobe nodule. Small bilateral pleural effusions. Airway is unremarkable. Upper Abdomen: Liver, gallbladder and adrenal glands are unremarkable. High and low-attenuation lesions in the kidneys are difficult to fully characterize. No specific follow-up necessary. Spleen, pancreas, stomach and visualized bowel are unremarkable with the exception of a tiny hiatal hernia. Midline ventral hernia repair. No upper abdominal adenopathy. Musculoskeletal: Degenerative changes in the spine. IMPRESSION: 1. Moderate bilateral lower lobe collapse/consolidation may be due to atelectasis and/or aspiration. 2. Previously seen subpleural left lower lobe nodule is obscured. Please refer to that report for further details and follow-up recommendation. 3. Small bilateral pleural effusions. 4. Aortic atherosclerosis (ICD10-I70.0). Coronary artery calcification. 5. Enlarged pulmonic trunk, indicative of pulmonary arterial hypertension. Electronically Signed   By: Lorin Picket M.D.   On: 10/26/2022 16:04   DG CHEST PORT 1 VIEW  Result Date: 10/26/2022 CLINICAL DATA:  [id number] COPD with acute exacerbation ([place]) [id number] EXAM: PORTABLE CHEST 1 VIEW COMPARISON:  October 23, 2022 FINDINGS: The cardiomediastinal silhouette is unchanged in contour. Favored trace bilateral pleural effusions. No pneumothorax. Improved aeration in the RIGHT lung.  Persistent scattered bibasilar opacities, likely atelectasis. Central vascular congestion. IMPRESSION: Improved aeration in the RIGHT lung. Persistent bibasilar atelectasis and trace bilateral pleural effusions. Electronically Signed   By: Valentino Saxon M.D.   On: 10/26/2022 07:51   DG CHEST PORT 1 VIEW  Result Date: 10/23/2022 CLINICAL DATA:  COPD EXAM: PORTABLE CHEST 1 VIEW COMPARISON:  Two days ago FINDINGS: Cardiomegaly and vascular pedicle widening. Band of atelectasis along the right minor fissure, lung volumes are low. Diffuse interstitial prominence. No focal consolidation, effusion, or pneumothorax. IMPRESSION: 1. Cardiomegaly and vascular congestion. 2. Worsening lung volumes and increased atelectasis. Electronically Signed   By: Jorje Guild M.D.   On: 10/23/2022 09:25   DG Chest Port 1 View  Result Date: 10/21/2022 CLINICAL DATA:  [id number] CHF (congestive heart failure) [facility]) [phone] [id number] COPD with acute exacerbation ([place]) [id number] EXAM: PORTABLE CHEST 1 VIEW COMPARISON:  October 20, 2022 FINDINGS: The cardiomediastinal silhouette is unchanged in contour. Trace bilateral pleural effusions. No pneumothorax. Chronic bronchitic markings. Perihilar vascular fullness without overt edema. Bibasilar platelike opacities. IMPRESSION: 1. Bibasilar platelike opacities, favored to reflect atelectasis. Favored trace bilateral pleural effusions. 2. Changes compatible with chronic bronchitis. Electronically Signed   By: Valentino Saxon M.D.   On: 10/21/2022 07:51   DG Chest Portable 1 View  Result Date: 10/20/2022 CLINICAL DATA:  Short of breath. EXAM: PORTABLE CHEST 1 VIEW COMPARISON:  Chest 08/18/2022 FINDINGS: Mild cardiac enlargement.  Negative for heart failure. Elevated right hemidiaphragm with mild right lower lobe atelectasis. Left lung clear. No effusion IMPRESSION: Elevated right hemidiaphragm with mild right lower lobe atelectasis. Electronically Signed   By: Franchot Gallo M.D.   On:  10/20/2022 09:33    Orson Eva, DO  [facility]  If 7PM-7AM, please contact night-coverage [email or web address] Password [email or web address] 10/28/2022, 3:50 PM   LOS: 8 days

## 2022-10-29 DIAGNOSIS — I5033 Acute on chronic diastolic (congestive) heart failure: Secondary | ICD-10-CM | POA: Diagnosis not present

## 2022-10-29 DIAGNOSIS — N184 Chronic kidney disease, stage 4 (severe): Secondary | ICD-10-CM | POA: Diagnosis not present

## 2022-10-29 DIAGNOSIS — J441 Chronic obstructive pulmonary disease with (acute) exacerbation: Secondary | ICD-10-CM | POA: Diagnosis not present

## 2022-10-29 DIAGNOSIS — I471 Supraventricular tachycardia, unspecified: Secondary | ICD-10-CM | POA: Diagnosis not present

## 2022-10-29 LAB — GLUCOSE, CAPILLARY
Glucose-Capillary: 151 mg/dL — ABNORMAL HIGH (ref 70–99)
Glucose-Capillary: 135 mg/dL — ABNORMAL HIGH (ref 70–99)
Glucose-Capillary: 201 mg/dL — ABNORMAL HIGH (ref 70–99)
Glucose-Capillary: 299 mg/dL — ABNORMAL HIGH (ref 70–99)
Glucose-Capillary: 328 mg/dL — ABNORMAL HIGH (ref 70–99)

## 2022-10-29 LAB — RENAL FUNCTION PANEL
Albumin: 3.1 g/dL — ABNORMAL LOW (ref 3.5–5.0)
Anion gap: 6 (ref 5–15)
BUN: 80 mg/dL — ABNORMAL HIGH (ref 8–23)
CO2: 29 mmol/L (ref 22–32)
Calcium: 8.3 mg/dL — ABNORMAL LOW (ref 8.9–10.3)
Chloride: 106 mmol/L (ref 98–111)
Creatinine, Ser: 2.61 mg/dL — ABNORMAL HIGH (ref 0.61–1.24)
GFR, Estimated: 24 mL/min — ABNORMAL LOW (ref >60)
Glucose, Bld: 139 mg/dL — ABNORMAL HIGH (ref 70–99)
Phosphorus: 3.8 mg/dL (ref 2.5–4.6)
Potassium: 4.2 mmol/L (ref 3.5–5.1)
Sodium: 141 mmol/L (ref 135–145)

## 2022-10-29 LAB — MAGNESIUM: Magnesium: 2.1 mg/dL (ref 1.7–2.4)

## 2022-10-29 LAB — TSH: TSH: 1.462 u[IU]/mL (ref 0.350–4.500)

## 2022-10-29 LAB — T4, FREE: Free T4: 0.92 ng/dL (ref 0.61–1.12)

## 2022-10-29 MED ORDER — DILTIAZEM HCL ER COATED BEADS 120 MG PO CP24
120.0000 mg | ORAL_CAPSULE | Freq: Every day | ORAL | Status: DC
  Administered 2022-10-30: 120 mg via ORAL
  Filled 2022-10-29: qty 1

## 2022-10-29 MED ORDER — INSULIN ASPART 100 UNIT/ML IJ SOLN
0.0000 [IU] | Freq: Every day | INTRAMUSCULAR | Status: DC
  Administered 2022-10-29: 4 [IU] via SUBCUTANEOUS

## 2022-10-29 MED ORDER — INSULIN ASPART 100 UNIT/ML IJ SOLN: 0.0000 [IU] | Freq: Three times a day (TID) | INTRAMUSCULAR | Status: DC

## 2022-10-29 NOTE — Progress Notes (Signed)
SATURATION QUALIFICATIONS: (This note is used to comply with regulatory documentation for home oxygen)   Patient Saturations on Room Air at Rest = 95   Patient Saturations on Room Air while Ambulating = 88   Patient Saturations on 2 Liters of oxygen while Ambulating = 95   Please briefly explain why patient needs home oxygen: To maintain 02 sat at 90% or above during ambulation.   Orson Eva, DO

## 2022-10-29 NOTE — Progress Notes (Signed)
Patient slept throughout the night, he tolerated BiPap well. No complaints of pain or discomfort at this time.

## 2022-10-29 NOTE — Progress Notes (Signed)
Patient has night time blood glucose of 328 with no insulin coverage. Attending made aware via secure chat. New orders received.

## 2022-10-29 NOTE — Progress Notes (Signed)
PROGRESS NOTE  Andrew Adams JKK:[id number] DOB: 31-Jan-1946 DOA: 10/20/2022 PCP: Lavella Lemons, PA  Brief History:  76 year old gentleman with stage IV CKD, hypertension, type 2 diabetes mellitus, pulmonary embolus on apixaban, OSA, diastolic heart failure presented to the emergency department complaining of progressive shortness of breath and increasing edema in bilateral lower extremities over the past week.  He has been coughing and wheezing frequently increasing in sputum production and malaise.  He has been using his home bronchodilators but does not seem to be able to control the symptoms.  He denies fever and chills.  He denies nausea vomiting and diarrhea.  He was noted in the ED to have 3+ pitting edema bilateral lower extremities and diffuse wheezing.  He was started on IV Lasix and treatment for acute COPD exacerbation.  Patient reports no chest pain symptoms.  Patient reports he may not have been taking his Lasix regularly in the last several weeks.  He is being admitted for further management.   Assessment/Plan:  COPD with acute exacerbation  - continue scheduled bronchodilators - doxycycline 100 mg BID--had 5 days - mucinex 1200 mg BID  - added pulmicort - added brovana - supplemental oxygen--2L - RT consult for resp support  - flutter valve -continue IV solumedrol BID>>decrease to once daily>>po prednisone -12/24: noted to be somnolent --->ABG ordered; pH 7.14, pCO2 98 -bipap started, transferred to stepdown ICU -now weaned back to 2L   Acute hypoxic and hypercarbic respiratory failure -pt has consistently refused to wear nightly CPAP -due to COPD exacerbation and pulm edema -noted in AM 12/24 to be confused/somnolent; pCO2 98, pH 7.14 -started on bipap 12/24 with improvement in mentation -continue nightly bipap  -now stable on 2L -plan to send home with oxygen 2L as he desaturated with ambulation to 88%   Paroxysmal SVT -d/c coreg -started  metoprolol -started diltiazem -check TSH 1.462 -personally reviewed EKG--SVT, nonspecific ST change - 08/20/22 Echo EF 60-65%, no WMA, G2DD, normal RVF -cardiology consult appreciated>>start amio, may need EP eval -SVT episodes improved on amiodarone   Acute on chronic HFpEF - treated with IV lasix; hold lasix  with uptrend creatinine - monitor weight, I/O,--NEG 18 lbs - 08/20/22 Echo EF 60-65%, no WMA, G2DD, normal RVF - obtain ReDS Vest reading--40   Essential hypertension  - resume home meds EXCEPT amlodipine -continue diltiazem, metoprolol -added amio   AKI on Stage IV CKD - he is followed by Dr. Theador Hawthorne (nephrologist) - he will need to follow up with Dr. Theador Hawthorne as an outpatient  - d/c ARB -baseline creatinine 2.1-2.4 -serum creatinine peakd 3.70 - hold lasix due to uptrend in serum creatinine>>improving   Controlled diabetes mellitus type 2 -Holding Trulicity>>restart after d/c -08/19/22 hemoglobin A1c--6.4 -NovoLog sliding scale   Acquired Thrombophilia - apixaban 5 mg BID has been resumed   PE - resumed home apixaban as above    Hyperlipidemia - resumed home atorvastatin daily    OSA -nightly CPAP refused by patient -now agreeable   Dilated ascending aorta -incidental finding on echo 08/20/22 -measured 38 mm on echo -will need outpatient surveilance     Family Communication:   spouse updated 12/30   Consultants:  none   Code Status:  FULL    DVT Prophylaxis:apixaban     Procedures: As Listed in Progress Note Above   Antibiotics: None     Subjective: Patient denies fevers, chills, headache, chest pain, dyspnea, nausea, vomiting, diarrhea, abdominal pain,  dysuria, hematuria, hematochezia, and melena.   Objective: Vitals:   10/29/22 0550 10/29/22 0859 10/29/22 1438 10/29/22 1450  BP:   (!) 160/82   Pulse:   62   Resp:      Temp:   98 F (36.7 C)   TempSrc:      SpO2:  95% 95% 93%  Weight: 97.3 kg     Height:         Intake/Output Summary (Last 24 hours) at 10/29/2022 1707 Last data filed at 10/29/2022 0550 Gross per 24 hour  Intake --  Output 1400 ml  Net -1400 ml   Weight change: -0.604 kg Exam:  General:  Pt is alert, follows commands appropriately, not in acute distress HEENT: No icterus, No thrush, No neck mass, [place]/AT Cardiovascular: RRR, S1/S2, no rubs, no gallops Respiratory: bibasilar rales. No wheeze Abdomen: Soft/+BS, non tender, non distended, no guarding Extremities: trace LE edema, No lymphangitis, No petechiae, No rashes, no synovitis   Data Reviewed: I have personally reviewed following labs and imaging studies Basic Metabolic Panel: Recent Labs  Lab 10/24/22 0358 10/25/22 0334 10/26/22 0421 10/27/22 0421 10/27/22 0422 10/28/22 0415 10/29/22 0321  NA 136   < > 146* 147* 146* 145 141  K 5.0   < > 5.0 4.6 4.7 4.9 4.2  CL 100   < > 108 109 108 110 106  CO2 27   < > 31 32 32 29 29  GLUCOSE 155*   < > 170* 183* 182* 202* 139*  BUN 77*   < > 97* 94* 93* 86* 80*  CREATININE 3.28*   < > 3.03* 2.75* 2.69* 2.61* 2.61*  CALCIUM 8.0*   < > 8.6* 8.5* 8.6* 8.6* 8.3*  MG 2.4  --  2.7*  --  2.4  --  2.1  PHOS  --    < > 4.2 4.1 4.1 4.2 3.8   < > = values in this interval not displayed.   Liver Function Tests: Recent Labs  Lab 10/25/22 0334 10/26/22 0421 10/27/22 0421 10/28/22 0415 10/29/22 0321  ALBUMIN 3.4* 3.1* 3.2* 3.2* 3.1*   No results for input(s): "LIPASE", "AMYLASE" in the last 168 hours. No results for input(s): "AMMONIA" in the last 168 hours. Coagulation Profile: No results for input(s): "INR", "PROTIME" in the last 168 hours. CBC: Recent Labs  Lab 10/24/22 0358 10/26/22 0421  WBC 6.0 3.5*  HGB 10.5* 10.3*  HCT 33.7* 31.9*  MCV 89.6 87.4  PLT 152 147*   Cardiac Enzymes: No results for input(s): "CKTOTAL", "CKMB", "CKMBINDEX", "TROPONINI" in the last 168 hours. BNP: Invalid input(s): "POCBNP" CBG: Recent Labs  Lab 10/28/22 2230  10/29/22 0325 10/29/22 0720 10/29/22 1119 10/29/22 1633  GLUCAP 190* 151* 135* 201* 299*   HbA1C: No results for input(s): "HGBA1C" in the last 72 hours. Urine analysis:    Component Value Date/Time   COLORURINE YELLOW 08/18/2022 1116   APPEARANCEUR CLEAR 08/18/2022 1116   LABSPEC 1.009 08/18/2022 1116   PHURINE 6.0 08/18/2022 1116   GLUCOSEU NEGATIVE 08/18/2022 1116   [place] 08/18/2022 1116   [place] 08/18/2022 1116   BILIRUBINUR negative 01/20/2014 1032   KETONESUR NEGATIVE 08/18/2022 1116   PROTEINUR 100 (A) 08/18/2022 1116   UROBILINOGEN negative 01/20/2014 1032   NITRITE NEGATIVE 08/18/2022 1116   LEUKOCYTESUR TRACE (A) 08/18/2022 1116   Sepsis Labs: '@LABRCNTIP'$ (procalcitonin:4,lacticidven:4) ) Recent Results (from the past 240 hour(s))  Resp panel by RT-PCR (RSV, Flu A&B, Covid) Anterior Nasal Swab  Status: None   Collection Time: 10/20/22  9:08 AM   Specimen: Anterior Nasal Swab  Result Value Ref Range Status   SARS Coronavirus 2 by RT PCR NEGATIVE NEGATIVE Final    Comment: (NOTE) SARS-CoV-2 target nucleic acids are NOT DETECTED.  The SARS-CoV-2 RNA is generally detectable in upper respiratory specimens during the acute phase of infection. The lowest concentration of SARS-CoV-2 viral copies this assay can detect is 138 copies/mL. A negative result does not preclude SARS-Cov-2 infection and should not be used as the sole basis for treatment or other patient management decisions. A negative result may occur with  improper specimen collection/handling, submission of specimen other than nasopharyngeal swab, presence of viral mutation(s) within the areas targeted by this assay, and inadequate number of viral copies(<138 copies/mL). A negative result must be combined with clinical observations, patient history, and epidemiological information. The expected result is Negative.  Fact Sheet for Patients:   EntrepreneurPulse.com.au  Fact Sheet for Healthcare Providers:  IncredibleEmployment.be  This test is no t yet approved or cleared by the [place] FDA and  has been authorized for detection and/or diagnosis of SARS-CoV-2 by FDA under an Emergency Use Authorization (EUA). This EUA will remain  in effect (meaning this test can be used) for the duration of the COVID-19 declaration under Section 564(b)(1) of the Act, 21 U.S.C.section 360bbb-3(b)(1), unless the authorization is terminated  or revoked sooner.       Influenza A by PCR NEGATIVE NEGATIVE Final   Influenza B by PCR NEGATIVE NEGATIVE Final    Comment: (NOTE) The Xpert Xpress SARS-CoV-2/FLU/RSV plus assay is intended as an aid in the diagnosis of influenza from Nasopharyngeal swab specimens and should not be used as a sole basis for treatment. Nasal washings and aspirates are unacceptable for Xpert Xpress SARS-CoV-2/FLU/RSV testing.  Fact Sheet for Patients: EntrepreneurPulse.com.au  Fact Sheet for Healthcare Providers: IncredibleEmployment.be  This test is not yet approved or cleared by the [place] FDA and has been authorized for detection and/or diagnosis of SARS-CoV-2 by FDA under an Emergency Use Authorization (EUA). This EUA will remain in effect (meaning this test can be used) for the duration of the COVID-19 declaration under Section 564(b)(1) of the Act, 21 U.S.C. section 360bbb-3(b)(1), unless the authorization is terminated or revoked.     Resp Syncytial Virus by PCR NEGATIVE NEGATIVE Final    Comment: (NOTE) Fact Sheet for Patients: EntrepreneurPulse.com.au  Fact Sheet for Healthcare Providers: IncredibleEmployment.be  This test is not yet approved or cleared by the [place] FDA and has been authorized for detection and/or diagnosis of SARS-CoV-2 by FDA under an Emergency Use  Authorization (EUA). This EUA will remain in effect (meaning this test can be used) for the duration of the COVID-19 declaration under Section 564(b)(1) of the Act, 21 U.S.C. section 360bbb-3(b)(1), unless the authorization is terminated or revoked.  Performed at [facility], [address] [place], [place] [address]   MRSA Next Gen by PCR, Nasal     Status: None   Collection Time: 10/23/22 10:15 AM   Specimen: Nasal Mucosa; Nasal Swab  Result Value Ref Range Status   MRSA by PCR Next Gen NOT DETECTED NOT DETECTED Final    Comment: (NOTE) The GeneXpert MRSA Assay (FDA approved for NASAL specimens only), is one component of a comprehensive MRSA colonization surveillance program. It is not intended to diagnose MRSA infection nor to guide or monitor treatment for MRSA infections. Test performance is not FDA approved in patients less than  75 years old. Performed at [facility], [address] [place], [place] [address]      Scheduled Meds:  allopurinol  100 mg Oral QHS   amiodarone  200 mg Oral BID   apixaban  5 mg Oral BID   arformoterol  15 mcg Nebulization BID   atorvastatin  40 mg Oral QHS   budesonide (PULMICORT) nebulizer solution  0.5 mg Nebulization BID   diltiazem  30 mg Oral Q6H   ferrous sulfate  325 mg Oral Q breakfast   hydrALAZINE  25 mg Oral Q6H   insulin aspart  0-9 Units Subcutaneous TID WC   ipratropium-albuterol  3 mL Nebulization TID   methylPREDNISolone (SOLU-MEDROL) injection  40 mg Intravenous Daily   metoprolol tartrate  50 mg Oral BID   multivitamin with minerals  1 tablet Oral Daily   pantoprazole  40 mg Oral BID AC   Continuous Infusions:  Procedures/Studies: CT CHEST WO CONTRAST  Result Date: 10/26/2022 CLINICAL DATA:  Chronic shortness of breath. Acute respiratory failure. EXAM: CT CHEST WITHOUT CONTRAST TECHNIQUE: Multidetector CT imaging of the chest was performed following the standard protocol without IV contrast. RADIATION DOSE REDUCTION:  This exam was performed according to the departmental dose-optimization program which includes automated exposure control, adjustment of the mA and/or kV according to patient size and/or use of iterative reconstruction technique. COMPARISON:  02/16/2022. FINDINGS: Cardiovascular: Atherosclerotic calcification of the aorta, aortic valve and coronary arteries. Enlarged pulmonic trunk and heart. No pericardial effusion. Mediastinum/Nodes: No pathologically enlarged mediastinal or axillary lymph nodes. Hilar regions are difficult to definitively evaluate without IV contrast. Esophagus is grossly unremarkable. Lungs/Pleura: Image quality is degraded by respiratory motion and expiratory phase imaging. Moderate collapse/consolidation in both lower lobes obscures a previously seen subpleural left lower lobe nodule. Small bilateral pleural effusions. Airway is unremarkable. Upper Abdomen: Liver, gallbladder and adrenal glands are unremarkable. High and low-attenuation lesions in the kidneys are difficult to fully characterize. No specific follow-up necessary. Spleen, pancreas, stomach and visualized bowel are unremarkable with the exception of a tiny hiatal hernia. Midline ventral hernia repair. No upper abdominal adenopathy. Musculoskeletal: Degenerative changes in the spine. IMPRESSION: 1. Moderate bilateral lower lobe collapse/consolidation may be due to atelectasis and/or aspiration. 2. Previously seen subpleural left lower lobe nodule is obscured. Please refer to that report for further details and follow-up recommendation. 3. Small bilateral pleural effusions. 4. Aortic atherosclerosis (ICD10-I70.0). Coronary artery calcification. 5. Enlarged pulmonic trunk, indicative of pulmonary arterial hypertension. Electronically Signed   By: Lorin Picket M.D.   On: 10/26/2022 16:04   DG CHEST PORT 1 VIEW  Result Date: 10/26/2022 CLINICAL DATA:  [id number] COPD with acute exacerbation ([place]) [id number] EXAM: PORTABLE CHEST 1 VIEW  COMPARISON:  October 23, 2022 FINDINGS: The cardiomediastinal silhouette is unchanged in contour. Favored trace bilateral pleural effusions. No pneumothorax. Improved aeration in the RIGHT lung. Persistent scattered bibasilar opacities, likely atelectasis. Central vascular congestion. IMPRESSION: Improved aeration in the RIGHT lung. Persistent bibasilar atelectasis and trace bilateral pleural effusions. Electronically Signed   By: Valentino Saxon M.D.   On: 10/26/2022 07:51   DG CHEST PORT 1 VIEW  Result Date: 10/23/2022 CLINICAL DATA:  COPD EXAM: PORTABLE CHEST 1 VIEW COMPARISON:  Two days ago FINDINGS: Cardiomegaly and vascular pedicle widening. Band of atelectasis along the right minor fissure, lung volumes are low. Diffuse interstitial prominence. No focal consolidation, effusion, or pneumothorax. IMPRESSION: 1. Cardiomegaly and vascular congestion. 2. Worsening lung volumes and increased atelectasis. Electronically Signed   By: Roderic Palau  Watts M.D.   On: 10/23/2022 09:25   DG Chest Port 1 View  Result Date: 10/21/2022 CLINICAL DATA:  [id number] CHF (congestive heart failure) [facility]) [phone] [id number] COPD with acute exacerbation ([place]) [id number] EXAM: PORTABLE CHEST 1 VIEW COMPARISON:  October 20, 2022 FINDINGS: The cardiomediastinal silhouette is unchanged in contour. Trace bilateral pleural effusions. No pneumothorax. Chronic bronchitic markings. Perihilar vascular fullness without overt edema. Bibasilar platelike opacities. IMPRESSION: 1. Bibasilar platelike opacities, favored to reflect atelectasis. Favored trace bilateral pleural effusions. 2. Changes compatible with chronic bronchitis. Electronically Signed   By: Valentino Saxon M.D.   On: 10/21/2022 07:51   DG Chest Portable 1 View  Result Date: 10/20/2022 CLINICAL DATA:  Short of breath. EXAM: PORTABLE CHEST 1 VIEW COMPARISON:  Chest 08/18/2022 FINDINGS: Mild cardiac enlargement.  Negative for heart failure. Elevated right hemidiaphragm with mild  right lower lobe atelectasis. Left lung clear. No effusion IMPRESSION: Elevated right hemidiaphragm with mild right lower lobe atelectasis. Electronically Signed   By: Franchot Gallo M.D.   On: 10/20/2022 09:33    Orson Eva, DO  [facility]  If 7PM-7AM, please contact night-coverage [email or web address] Password [email or web address] 10/29/2022, 5:07 PM   LOS: 9 days

## 2022-10-30 DIAGNOSIS — E782 Mixed hyperlipidemia: Secondary | ICD-10-CM | POA: Diagnosis not present

## 2022-10-30 DIAGNOSIS — K219 Gastro-esophageal reflux disease without esophagitis: Secondary | ICD-10-CM | POA: Diagnosis not present

## 2022-10-30 DIAGNOSIS — I5033 Acute on chronic diastolic (congestive) heart failure: Secondary | ICD-10-CM | POA: Diagnosis not present

## 2022-10-30 DIAGNOSIS — J441 Chronic obstructive pulmonary disease with (acute) exacerbation: Secondary | ICD-10-CM | POA: Diagnosis not present

## 2022-10-30 DIAGNOSIS — N184 Chronic kidney disease, stage 4 (severe): Secondary | ICD-10-CM | POA: Diagnosis not present

## 2022-10-30 DIAGNOSIS — I471 Supraventricular tachycardia, unspecified: Secondary | ICD-10-CM | POA: Diagnosis not present

## 2022-10-30 DIAGNOSIS — I10 Essential (primary) hypertension: Secondary | ICD-10-CM | POA: Diagnosis not present

## 2022-10-30 DIAGNOSIS — J449 Chronic obstructive pulmonary disease, unspecified: Secondary | ICD-10-CM | POA: Diagnosis not present

## 2022-10-30 LAB — RENAL FUNCTION PANEL
Albumin: 3 g/dL — ABNORMAL LOW (ref 3.5–5.0)
Anion gap: 6 (ref 5–15)
BUN: 74 mg/dL — ABNORMAL HIGH (ref 8–23)
CO2: 28 mmol/L (ref 22–32)
Calcium: 8.4 mg/dL — ABNORMAL LOW (ref 8.9–10.3)
Chloride: 107 mmol/L (ref 98–111)
Creatinine, Ser: 2.43 mg/dL — ABNORMAL HIGH (ref 0.61–1.24)
GFR, Estimated: 27 mL/min — ABNORMAL LOW (ref >60)
Glucose, Bld: 150 mg/dL — ABNORMAL HIGH (ref 70–99)
Phosphorus: 3.6 mg/dL (ref 2.5–4.6)
Potassium: 4.3 mmol/L (ref 3.5–5.1)
Sodium: 141 mmol/L (ref 135–145)

## 2022-10-30 LAB — GLUCOSE, CAPILLARY
Glucose-Capillary: 159 mg/dL — ABNORMAL HIGH (ref 70–99)
Glucose-Capillary: 115 mg/dL — ABNORMAL HIGH (ref 70–99)

## 2022-10-30 MED ORDER — FUROSEMIDE 40 MG PO TABS: 40.0000 mg | ORAL_TABLET | Freq: Every day | ORAL | 1 refills | Status: DC

## 2022-10-30 MED ORDER — AMIODARONE HCL 200 MG PO TABS: 200.0000 mg | ORAL_TABLET | Freq: Two times a day (BID) | ORAL | 1 refills | Status: DC

## 2022-10-30 MED ORDER — DILTIAZEM HCL ER COATED BEADS 120 MG PO CP24: 120.0000 mg | ORAL_CAPSULE | Freq: Every day | ORAL | 1 refills | Status: DC

## 2022-10-30 MED ORDER — PREDNISONE 20 MG PO TABS: 40.0000 mg | ORAL_TABLET | Freq: Every day | ORAL | Status: DC

## 2022-10-30 MED ORDER — FUROSEMIDE 40 MG PO TABS: 40.0000 mg | ORAL_TABLET | Freq: Every day | ORAL | Status: DC

## 2022-10-30 MED ORDER — PREDNISONE 20 MG PO TABS: 20.0000 mg | ORAL_TABLET | Freq: Every day | ORAL | 0 refills | Status: DC

## 2022-10-30 MED ORDER — PREDNISONE 20 MG PO TABS: 20.0000 mg | ORAL_TABLET | Freq: Every day | ORAL | Status: DC

## 2022-10-30 MED ORDER — METOPROLOL TARTRATE 50 MG PO TABS: 50.0000 mg | ORAL_TABLET | Freq: Two times a day (BID) | ORAL | 1 refills | Status: DC

## 2022-10-30 MED ORDER — HYDRALAZINE HCL 25 MG PO TABS: 25.0000 mg | ORAL_TABLET | Freq: Four times a day (QID) | ORAL | 1 refills | Status: AC

## 2022-10-30 NOTE — Discharge Summary (Signed)
Physician Discharge Summary   Patient: Andrew Adams MRN: [id number] DOB: [birth year]  Admit date:     10/20/2022  Discharge date: 10/30/22  Discharge Physician: Shanon Brow Lorene Klimas   PCP: Lavella Lemons, PA   Recommendations at discharge:   Please follow up with primary care provider within 1-2 weeks  Please repeat BMP and CBC in one week   Discharge Diagnoses:  Hospital Course: 76 year old gentleman with stage IV CKD, hypertension, type 2 diabetes mellitus, pulmonary embolus on apixaban, OSA, diastolic heart failure presented to the emergency department complaining of progressive shortness of breath and increasing edema in bilateral lower extremities over the past week.  He has been coughing and wheezing frequently increasing in sputum production and malaise.  He has been using his home bronchodilators but does not seem to be able to control the symptoms.  He denies fever and chills.  He denies nausea vomiting and diarrhea.  He was noted in the ED to have 3+ pitting edema bilateral lower extremities and diffuse wheezing.  He was started on IV Lasix and treatment for acute COPD exacerbation.  Patient reports no chest pain symptoms.  Patient reports he may not have been taking his Lasix regularly in the last several weeks.  He is being admitted for further management.  Assessment and Plan:  COPD with acute exacerbation  - continue scheduled bronchodilators - doxycycline 100 mg BID--had 5 days - mucinex 1200 mg BID  - added pulmicort - added brovana - supplemental oxygen--2L - RT consult for resp support  - flutter valve -continue IV solumedrol BID>>decrease to once daily>>po prednisone 20 mg daily x 3 days after dc -12/24: noted to be somnolent --->ABG ordered; pH 7.14, pCO2 98 -bipap started, transferred to stepdown ICU -he improved and was moved to medical floor -now weaned back to 2L   Acute hypoxic and hypercarbic respiratory failure -pt has consistently refused to wear nightly  CPAP -due to COPD exacerbation and pulm edema -noted in AM 12/24 to be confused/somnolent; pCO2 98, pH 7.14 -started on bipap 12/24 with improvement in mentation -continue nightly bipap  -now stable on 2L -plan to send home with oxygen 2L as he desaturated with ambulation to 88%   Paroxysmal SVT -d/c coreg -started metoprolol -started diltiazem>>converted to cardizem CD 120 mg daily -check TSH 1.462 -personally reviewed EKG--SVT, nonspecific ST change - 08/20/22 Echo EF 60-65%, no WMA, G2DD, normal RVF -cardiology consult appreciated>>start amio, may need EP eval -SVT episodes improved on amiodarone   Acute on chronic HFpEF - treated with IV lasix; hold lasix  with uptrend creatinine - monitor weight, I/O,--NEG 18 lbs - 08/20/22 Echo EF 60-65%, no WMA, G2DD, normal RVF - d/c home with lasix 40 mg daily   Essential hypertension  - resume home meds EXCEPT amlodipine -continue diltiazem, metoprolol -added amio and hydralazine   AKI on Stage IV CKD - he is followed by Dr. Theador Hawthorne (nephrologist) - he will need to follow up with Dr. Theador Hawthorne as an outpatient  - d/c ARB -baseline creatinine 2.1-2.4 -serum creatinine peakd 3.70 - hold lasix due to uptrend in serum creatinine>>improving -serum creatinine 2.43 on day of d/c   Controlled diabetes mellitus type 2 -Holding Trulicity>>restart after d/c -08/19/22 hemoglobin A1c--6.4 -NovoLog sliding scale   Acquired Thrombophilia - apixaban 5 mg BID has been resumed   PE - resumed home apixaban as above    Hyperlipidemia - resumed home atorvastatin daily    OSA -nightly CPAP refused by patient -now agreeable   Dilated  ascending aorta -incidental finding on echo 08/20/22 -measured 38 mm on echo -will need outpatient surveilance  Consultants: cardiology  Procedures performed: none  Disposition: Home Diet recommendation:  Carb modified diet DISCHARGE MEDICATION: Allergies as of 10/30/2022   No Known Allergies       Medication List     STOP taking these medications    amLODipine 10 MG tablet Commonly known as: NORVASC   carvedilol 12.5 MG tablet Commonly known as: COREG   valsartan 40 MG tablet Commonly known as: DIOVAN       TAKE these medications    allopurinol 100 MG tablet Commonly known as: ZYLOPRIM Take 1 tablet (100 mg total) by mouth daily. What changed: when to take this   amiodarone 200 MG tablet Commonly known as: PACERONE Take 1 tablet (200 mg total) by mouth 2 (two) times daily.   apixaban 5 MG Tabs tablet Commonly known as: ELIQUIS Take 2 tablets (10 mg total) by mouth 2 (two) times daily. On 08/27/22, start 5 mg  (1 tab) two times daily   atorvastatin 40 MG tablet Commonly known as: LIPITOR Take 1 tablet (40 mg total) by mouth daily. What changed: when to take this   diltiazem 120 MG 24 hr capsule Commonly known as: CARDIZEM CD Take 1 capsule (120 mg total) by mouth daily. Start taking on: October 31, 2022   FeroSul 325 (65 FE) MG tablet Generic drug: ferrous sulfate TAKE ONE TABLET BY MOUTH EVERY MORNING WITH BREAKFAST What changed: See the new instructions.   furosemide 40 MG tablet Commonly known as: LASIX Take 1 tablet (40 mg total) by mouth daily. What changed: when to take this   hydrALAZINE 25 MG tablet Commonly known as: APRESOLINE Take 1 tablet (25 mg total) by mouth every 6 (six) hours.   ipratropium-albuterol 0.5-2.5 (3) MG/3ML Soln Commonly known as: DUONEB Take 3 mLs by nebulization every 6 (six) hours. What changed:  when to take this reasons to take this   metoprolol tartrate 50 MG tablet Commonly known as: LOPRESSOR Take 1 tablet (50 mg total) by mouth 2 (two) times daily.   multivitamin with minerals Tabs tablet Take 1 tablet by mouth daily.   pantoprazole 40 MG tablet Commonly known as: PROTONIX Take 1 tablet (40 mg total) by mouth 2 (two) times daily before a meal.   potassium chloride 10 MEQ tablet Commonly known as:  KLOR-CON Take 10 mEq by mouth daily.   predniSONE 20 MG tablet Commonly known as: DELTASONE Take 1 tablet (20 mg total) by mouth daily with breakfast. Start taking on: October 31, 2022   Retacrit 3000 UNIT/ML injection Generic drug: epoetin alfa-epbx Inject 3,000 Units into the vein every 14 (fourteen) days.   Trulicity 4.33 IR/5.1OA Sopn Generic drug: Dulaglutide Inject 0.75 mg into the skin every Friday.               Durable Medical Equipment  (From admission, onward)           Start     Ordered   10/29/22 1646  For home use only DME oxygen  Once       Question Answer Comment  Length of Need 6 Months   Mode or (Route) Nasal cannula   Liters per Minute 2   Frequency Continuous (stationary and portable oxygen unit needed)   Oxygen conserving device Yes   Oxygen delivery system Gas      10/29/22 1645  Follow-up Information     [place] Follow up.   Why: PT wil call to schedule first home visit.        Mallipeddi, Quenten Raven, MD Follow up on 11/28/2022.   Specialties: Cardiology, Internal Medicine Why: at 11:30 AM with Finis Bud, NP for cardiology follow up  this is in [facility] information: [place] [address] [phone]         Mealor, Yetta Barre, MD Follow up on 11/07/2022.   Specialty: Cardiology Why: at 3 PM this is in [place].  if you cannot make this appointment please call the number above to reschedule. Contact information: [address] [place] [address] [phone]                Discharge Exam: Danley Danker Weights   10/28/22 0343 10/29/22 0550 10/30/22 0507  Weight: 97.9 kg 97.3 kg 98.3 kg   HEENT:  [place]/AT, No thrush, no icterus CV:  RRR, no rub, no S3, no S4 Lung:  CTA, no wheeze, no rhonchi Abd:  soft/+BS, NT Ext:  1 +LE edema, no lymphangitis, no synovitis, no rash   Condition at discharge: stable  The results of significant diagnostics from this  hospitalization (including imaging, microbiology, ancillary and laboratory) are listed below for reference.   Imaging Studies: CT CHEST WO CONTRAST  Result Date: 10/26/2022 CLINICAL DATA:  Chronic shortness of breath. Acute respiratory failure. EXAM: CT CHEST WITHOUT CONTRAST TECHNIQUE: Multidetector CT imaging of the chest was performed following the standard protocol without IV contrast. RADIATION DOSE REDUCTION: This exam was performed according to the departmental dose-optimization program which includes automated exposure control, adjustment of the mA and/or kV according to patient size and/or use of iterative reconstruction technique. COMPARISON:  02/16/2022. FINDINGS: Cardiovascular: Atherosclerotic calcification of the aorta, aortic valve and coronary arteries. Enlarged pulmonic trunk and heart. No pericardial effusion. Mediastinum/Nodes: No pathologically enlarged mediastinal or axillary lymph nodes. Hilar regions are difficult to definitively evaluate without IV contrast. Esophagus is grossly unremarkable. Lungs/Pleura: Image quality is degraded by respiratory motion and expiratory phase imaging. Moderate collapse/consolidation in both lower lobes obscures a previously seen subpleural left lower lobe nodule. Small bilateral pleural effusions. Airway is unremarkable. Upper Abdomen: Liver, gallbladder and adrenal glands are unremarkable. High and low-attenuation lesions in the kidneys are difficult to fully characterize. No specific follow-up necessary. Spleen, pancreas, stomach and visualized bowel are unremarkable with the exception of a tiny hiatal hernia. Midline ventral hernia repair. No upper abdominal adenopathy. Musculoskeletal: Degenerative changes in the spine. IMPRESSION: 1. Moderate bilateral lower lobe collapse/consolidation may be due to atelectasis and/or aspiration. 2. Previously seen subpleural left lower lobe nodule is obscured. Please refer to that report for further details and  follow-up recommendation. 3. Small bilateral pleural effusions. 4. Aortic atherosclerosis (ICD10-I70.0). Coronary artery calcification. 5. Enlarged pulmonic trunk, indicative of pulmonary arterial hypertension. Electronically Signed   By: Lorin Picket M.D.   On: 10/26/2022 16:04   DG CHEST PORT 1 VIEW  Result Date: 10/26/2022 CLINICAL DATA:  [id number] COPD with acute exacerbation ([place]) [id number] EXAM: PORTABLE CHEST 1 VIEW COMPARISON:  October 23, 2022 FINDINGS: The cardiomediastinal silhouette is unchanged in contour. Favored trace bilateral pleural effusions. No pneumothorax. Improved aeration in the RIGHT lung. Persistent scattered bibasilar opacities, likely atelectasis. Central vascular congestion. IMPRESSION: Improved aeration in the RIGHT lung. Persistent bibasilar atelectasis and trace bilateral pleural effusions. Electronically Signed   By: Valentino Saxon M.D.   On: 10/26/2022 07:51  DG CHEST PORT 1 VIEW  Result Date: 10/23/2022 CLINICAL DATA:  COPD EXAM: PORTABLE CHEST 1 VIEW COMPARISON:  Two days ago FINDINGS: Cardiomegaly and vascular pedicle widening. Band of atelectasis along the right minor fissure, lung volumes are low. Diffuse interstitial prominence. No focal consolidation, effusion, or pneumothorax. IMPRESSION: 1. Cardiomegaly and vascular congestion. 2. Worsening lung volumes and increased atelectasis. Electronically Signed   By: Jorje Guild M.D.   On: 10/23/2022 09:25   DG Chest Port 1 View  Result Date: 10/21/2022 CLINICAL DATA:  [id number] CHF (congestive heart failure) [facility]) [phone] [id number] COPD with acute exacerbation ([place]) [id number] EXAM: PORTABLE CHEST 1 VIEW COMPARISON:  October 20, 2022 FINDINGS: The cardiomediastinal silhouette is unchanged in contour. Trace bilateral pleural effusions. No pneumothorax. Chronic bronchitic markings. Perihilar vascular fullness without overt edema. Bibasilar platelike opacities. IMPRESSION: 1. Bibasilar platelike opacities, favored to reflect  atelectasis. Favored trace bilateral pleural effusions. 2. Changes compatible with chronic bronchitis. Electronically Signed   By: Valentino Saxon M.D.   On: 10/21/2022 07:51   DG Chest Portable 1 View  Result Date: 10/20/2022 CLINICAL DATA:  Short of breath. EXAM: PORTABLE CHEST 1 VIEW COMPARISON:  Chest 08/18/2022 FINDINGS: Mild cardiac enlargement.  Negative for heart failure. Elevated right hemidiaphragm with mild right lower lobe atelectasis. Left lung clear. No effusion IMPRESSION: Elevated right hemidiaphragm with mild right lower lobe atelectasis. Electronically Signed   By: Franchot Gallo M.D.   On: 10/20/2022 09:33    Microbiology: Results for orders placed or performed during the hospital encounter of 10/20/22  Resp panel by RT-PCR (RSV, Flu A&B, Covid) Anterior Nasal Swab     Status: None   Collection Time: 10/20/22  9:08 AM   Specimen: Anterior Nasal Swab  Result Value Ref Range Status   SARS Coronavirus 2 by RT PCR NEGATIVE NEGATIVE Final    Comment: (NOTE) SARS-CoV-2 target nucleic acids are NOT DETECTED.  The SARS-CoV-2 RNA is generally detectable in upper respiratory specimens during the acute phase of infection. The lowest concentration of SARS-CoV-2 viral copies this assay can detect is 138 copies/mL. A negative result does not preclude SARS-Cov-2 infection and should not be used as the sole basis for treatment or other patient management decisions. A negative result may occur with  improper specimen collection/handling, submission of specimen other than nasopharyngeal swab, presence of viral mutation(s) within the areas targeted by this assay, and inadequate number of viral copies(<138 copies/mL). A negative result must be combined with clinical observations, patient history, and epidemiological information. The expected result is Negative.  Fact Sheet for Patients:  EntrepreneurPulse.com.au  Fact Sheet for Healthcare Providers:   IncredibleEmployment.be  This test is no t yet approved or cleared by the [place] FDA and  has been authorized for detection and/or diagnosis of SARS-CoV-2 by FDA under an Emergency Use Authorization (EUA). This EUA will remain  in effect (meaning this test can be used) for the duration of the COVID-19 declaration under Section 564(b)(1) of the Act, 21 U.S.C.section 360bbb-3(b)(1), unless the authorization is terminated  or revoked sooner.       Influenza A by PCR NEGATIVE NEGATIVE Final   Influenza B by PCR NEGATIVE NEGATIVE Final    Comment: (NOTE) The Xpert Xpress SARS-CoV-2/FLU/RSV plus assay is intended as an aid in the diagnosis of influenza from Nasopharyngeal swab specimens and should not be used as a sole basis for treatment. Nasal washings and aspirates are unacceptable for Xpert Xpress SARS-CoV-2/FLU/RSV testing.  Fact Sheet for Patients: EntrepreneurPulse.com.au  Fact  Sheet for Healthcare Providers: IncredibleEmployment.be  This test is not yet approved or cleared by the [place] and has been authorized for detection and/or diagnosis of SARS-CoV-2 by FDA under an Emergency Use Authorization (EUA). This EUA will remain in effect (meaning this test can be used) for the duration of the COVID-19 declaration under Section 564(b)(1) of the Act, 21 U.S.C. section 360bbb-3(b)(1), unless the authorization is terminated or revoked.     Resp Syncytial Virus by PCR NEGATIVE NEGATIVE Final    Comment: (NOTE) Fact Sheet for Patients: EntrepreneurPulse.com.au  Fact Sheet for Healthcare Providers: IncredibleEmployment.be  This test is not yet approved or cleared by the [place] FDA and has been authorized for detection and/or diagnosis of SARS-CoV-2 by FDA under an Emergency Use Authorization (EUA). This EUA will remain in effect (meaning this test can be used) for  the duration of the COVID-19 declaration under Section 564(b)(1) of the Act, 21 U.S.C. section 360bbb-3(b)(1), unless the authorization is terminated or revoked.  Performed at [facility], [address] [place], [place] [address]   MRSA Next Gen by PCR, Nasal     Status: None   Collection Time: 10/23/22 10:15 AM   Specimen: Nasal Mucosa; Nasal Swab  Result Value Ref Range Status   MRSA by PCR Next Gen NOT DETECTED NOT DETECTED Final    Comment: (NOTE) The GeneXpert MRSA Assay (FDA approved for NASAL specimens only), is one component of a comprehensive MRSA colonization surveillance program. It is not intended to diagnose MRSA infection nor to guide or monitor treatment for MRSA infections. Test performance is not FDA approved in patients less than 70 years old. Performed at [facility], [address] [place], [place] [address]     Labs: CBC: Recent Labs  Lab 10/24/22 0358 10/26/22 0421  WBC 6.0 3.5*  HGB 10.5* 10.3*  HCT 33.7* 31.9*  MCV 89.6 87.4  PLT 152 675*   Basic Metabolic Panel: Recent Labs  Lab 10/24/22 0358 10/25/22 0334 10/26/22 0421 10/27/22 0421 10/27/22 0422 10/28/22 0415 10/29/22 0321 10/30/22 0429  NA 136   < > 146* 147* 146* 145 141 141  K 5.0   < > 5.0 4.6 4.7 4.9 4.2 4.3  CL 100   < > 108 109 108 110 106 107  CO2 27   < > 31 32 32 '29 29 28  '$ GLUCOSE 155*   < > 170* 183* 182* 202* 139* 150*  BUN 77*   < > 97* 94* 93* 86* 80* 74*  CREATININE 3.28*   < > 3.03* 2.75* 2.69* 2.61* 2.61* 2.43*  CALCIUM 8.0*   < > 8.6* 8.5* 8.6* 8.6* 8.3* 8.4*  MG 2.4  --  2.7*  --  2.4  --  2.1  --   PHOS  --    < > 4.2 4.1 4.1 4.2 3.8 3.6   < > = values in this interval not displayed.   Liver Function Tests: Recent Labs  Lab 10/26/22 0421 10/27/22 0421 10/28/22 0415 10/29/22 0321 10/30/22 0429  ALBUMIN 3.1* 3.2* 3.2* 3.1* 3.0*   CBG: Recent Labs  Lab 10/29/22 1119 10/29/22 1633 10/29/22 2118 10/30/22 0324 10/30/22 0742  GLUCAP 201* 299* 328*  159* 115*    Discharge time spent: greater than 30 minutes.  Signed: Orson Eva, MD [facility] 10/30/2022

## 2022-10-30 NOTE — TOC Transition Note (Signed)
Transition of Care [facility]) - CM/SW Discharge Note   Patient Details  Name: Andrew Adams MRN: [id number] Date of Birth: 09/01/1946  [facility] CM/SW Contact:  Boneta Lucks, RN Phone Number: 10/30/2022, 10:52 AM   Clinical Narrative:   Discharging home today with need of home Oxygen. Agreeable to [place]. Referral sent to [facility], tank delivered and home set up scheduled with wife.  Updated Sarah with [place] of discharge.    Final next level of care: Home/Self Care Barriers to Discharge: Continued Medical Work up   Patient Goals and CMS Choice CMS Medicare.gov Compare Post Acute Care list provided to:: Patient Choice offered to / list presented to : Patient  Discharge Placement      Patient and family notified of of transfer: 10/30/22  Discharge Plan and Services Additional resources added to the After Visit Summary for                  DME Arranged: Oxygen DME Agency: [employer] [facility] Date DME Agency Contacted: 10/30/22 Time DME Agency Contacted: [phone] Representative spoke with at DME Agency: : PT   Date [place]: 10/26/22 Time [place]: [phone] Representative spoke with at [place]: [place] ([place]) Interventions [place]: No Food Insecurity (10/20/2022)  Housing: Low Risk  (10/20/2022)  Transportation Needs: No Transportation Needs (10/20/2022)  Utilities: Not At Risk (10/20/2022)  Tobacco Use: Medium Risk (10/20/2022)     Readmission Risk Interventions    10/30/2022   10:50 AM 10/21/2022    3:31 PM  Readmission Risk Prevention Plan  Transportation Screening  Complete  PCP or Specialist Appt within 3-5 Days Complete Not Complete  HRI or [place]  Complete  Social Work Consult for [place] Planning/Counseling  Complete  Palliative Care Screening  Not Applicable  Medication Review Press photographer)  Complete

## 2022-11-02 ENCOUNTER — Encounter (HOSPITAL_COMMUNITY)
Admission: RE | Admit: 2022-11-02 | Discharge: 2022-11-02 | Disposition: A | Discharge status: Home / Self Care | Payer: Medicare HMO | Source: Ambulatory Visit | Attending: Nephrology | Admitting: Nephrology

## 2022-11-02 VITALS — BP 165/82 | HR 71 | Temp 98.0°F | Resp 16

## 2022-11-02 DIAGNOSIS — J449 Chronic obstructive pulmonary disease, unspecified: Secondary | ICD-10-CM | POA: Diagnosis not present

## 2022-11-02 DIAGNOSIS — I471 Supraventricular tachycardia, unspecified: Secondary | ICD-10-CM | POA: Insufficient documentation

## 2022-11-02 DIAGNOSIS — N189 Chronic kidney disease, unspecified: Secondary | ICD-10-CM | POA: Diagnosis not present

## 2022-11-02 DIAGNOSIS — Z7901 Long term (current) use of anticoagulants: Secondary | ICD-10-CM | POA: Insufficient documentation

## 2022-11-02 DIAGNOSIS — I2699 Other pulmonary embolism without acute cor pulmonale: Secondary | ICD-10-CM | POA: Diagnosis not present

## 2022-11-02 DIAGNOSIS — I77819 Aortic ectasia, unspecified site: Secondary | ICD-10-CM | POA: Diagnosis not present

## 2022-11-02 DIAGNOSIS — I10 Essential (primary) hypertension: Secondary | ICD-10-CM | POA: Insufficient documentation

## 2022-11-02 DIAGNOSIS — Z862 Personal history of diseases of the blood and blood-forming organs and certain disorders involving the immune mechanism: Secondary | ICD-10-CM | POA: Insufficient documentation

## 2022-11-02 DIAGNOSIS — I5032 Chronic diastolic (congestive) heart failure: Secondary | ICD-10-CM | POA: Insufficient documentation

## 2022-11-02 LAB — POCT HEMOGLOBIN-HEMACUE: Hemoglobin: 10.9 g/dL — ABNORMAL LOW (ref 13.0–17.0)

## 2022-11-02 MED ORDER — EPOETIN ALFA-EPBX 3000 UNIT/ML IJ SOLN: 3000.0000 [IU] | Freq: Once | INTRAMUSCULAR | Status: DC

## 2022-11-02 NOTE — Progress Notes (Signed)
Diagnosis: Anemia in Chronic Kidney Disease  Provider:  Manpreet Bhutani MD  Procedure: Injection  Retacrit (epoetin alfa-epbx), Dose: 3000 Units, Site: subcutaneous, Number of injections: Not Given  Not given, HGB 10.6  Post Care: Patient declined observation  Discharge: Condition: Good, Destination: Home . AVS provided to patient.   Performed by:  Baxter Hire, RN

## 2022-11-02 NOTE — Addendum Note (Signed)
Encounter addended by: Ramond Craver, [facility] on: 11/02/2022 3:55 PM  Actions taken: Order list changed

## 2022-11-07 ENCOUNTER — Ambulatory Visit: Payer: Medicare HMO | Attending: Cardiovascular Disease

## 2022-11-07 ENCOUNTER — Encounter (INDEPENDENT_AMBULATORY_CARE_PROVIDER_SITE_OTHER): Payer: Medicare HMO | Admitting: Cardiovascular Disease

## 2022-11-07 ENCOUNTER — Encounter: Payer: Self-pay | Admitting: Cardiovascular Disease

## 2022-11-07 VITALS — BP 180/100 | HR 74 | Ht 69.0 in | Wt 226.0 lb

## 2022-11-07 DIAGNOSIS — I471 Supraventricular tachycardia, unspecified: Secondary | ICD-10-CM

## 2022-11-07 MED ORDER — AMLODIPINE BESYLATE 5 MG PO TABS: 5.0000 mg | ORAL_TABLET | Freq: Every day | ORAL | 3 refills | Status: DC

## 2022-11-07 NOTE — Progress Notes (Unsigned)
Enrolled for [employer] to mail a ZIO XT long term holter monitor to the patients address on file.  

## 2022-11-07 NOTE — Patient Instructions (Signed)
Medication Instructions:  Your physician has recommended you make the following change in your medication:   1) STOP amiodarone 2) START amlodipine '5mg'$  daily  *If you need a refill on your cardiac medications before your next appointment, please call your pharmacy*  Lab Work: NONE  Testing/Procedures: Your physician has requested that you wear a Zio heart monitor for 14 days. This will be mailed to your home with instructions on how to apply the monitor and how to return it when finished. Please allow 2 weeks after returning the heart monitor before our office calls you with the results.   Follow-Up: At [facility], you and your health needs are our priority.  As part of our continuing mission to provide you with exceptional heart care, we have created designated Provider Care Teams.  These Care Teams include your primary Cardiologist (physician) and Advanced Practice Providers (APPs -  Physician Assistants and Nurse Practitioners) who all work together to provide you with the care you need, when you need it.  Your next appointment:   7-8 week(s)  The format for your next appointment:   In Person  Provider:   You may see Melida Quitter, MD or one of the following Advanced Practice Providers on your designated Care Team:   Tommye Standard, [place] Legrand Como "Jonni Sanger" Chalmers Cater, [place]    Other Instructions Bryn Gulling- Long Term Monitor Instructions     Your physician has requested you wear a ZIO patch monitor for 14 days.  This is a single patch monitor. [employer] supplies one patch monitor per enrollment. Additional  stickers are not available. Please do not apply patch if you will be having a Nuclear Stress Test,  Echocardiogram, Cardiac CT, MRI, or Chest Xray during the period you would be wearing the  monitor. The patch cannot be worn during these tests. You cannot remove and re-apply the  ZIO XT patch monitor.  Your ZIO patch monitor will be mailed 3 day USPS to your address on file.  It may take 3-5 days  to receive your monitor after you have been enrolled.  Once you have received your monitor, please review the enclosed instructions. Your monitor  has already been registered assigning a specific monitor serial # to you.     Billing and Patient Assistance Program Information     We have supplied [employer] with any of your insurance information on file for billing purposes.  [employer] offers a sliding scale Patient Assistance Program for patients that do not have  insurance, or whose insurance does not completely cover the cost of the ZIO monitor.  You must apply for the Patient Assistance Program to qualify for this discounted rate.  To apply, please call [employer] at [phone], select option 4, select option 2, ask to apply for  Patient Assistance Program. Theodore Demark will ask your household income, and how many people  are in your household. They will quote your out-of-pocket cost based on that information.  [employer] will also be able to set up a 55-month interest-free payment plan if needed.     Applying the monitor     Shave hair from upper left chest.  Hold abrader disc by orange tab. Rub abrader in 40 strokes over the upper left chest as  indicated in your monitor instructions.  Clean area with 4 enclosed alcohol pads. Let dry.  Apply patch as indicated in monitor instructions. Patch will be placed under collarbone on left  side of chest with arrow pointing upward.  Rub patch  adhesive wings for 2 minutes. Remove white label marked "1". Remove the white  label marked "2". Rub patch adhesive wings for 2 additional minutes.  While looking in a mirror, press and release button in center of patch. A small green light will  flash 3-4 times. This will be your only indicator that the monitor has been turned on.  Do not shower for the first 24 hours. You may shower after the first 24 hours.  Press the button if you feel a symptom. You will hear a small click. Record Date,  Time and  Symptom in the Patient Logbook.  When you are ready to remove the patch, follow instructions on the last 2 pages of Patient  Logbook. Stick patch monitor onto the last page of Patient Logbook.  Place Patient Logbook in the blue and white box. Use locking tab on box and tape box closed  securely. The blue and white box has prepaid postage on it. Please place it in the mailbox as  soon as possible. Your physician should have your test results approximately 7 days after the  monitor has been mailed back to [facility]  Call [facility] at [phone] if you have questions regarding  your ZIO XT patch monitor. Call them immediately if you see an orange light blinking on your  monitor.  If your monitor falls off in less than 4 days, contact our Monitor department at [phone].  If your monitor becomes loose or falls off after 4 days call [employer] at [phone] for  suggestions on securing your monitor.    Important Information About Sugar

## 2022-11-07 NOTE — Progress Notes (Signed)
Electrophysiology Office Note:    Date:  11/07/2022   ID:  Andrew Adams, DOB 1946-02-21, MRN [id number]  PCP:  Lavella Lemons, [place] [facility] Cardiologist:  None Electrophysiologist:  Melida Quitter, MD     Referring MD: Lavella Lemons, PA   History of Present Illness:    Andrew Adams is a 77 y.o. male with a hx listed below, significant for CKD IV, DM, HTN, OSA, HFpEF, and SVT referred for arrhythmia management.  He was admitted in Dec 2023 with COPD exacerbation and acute CHFpEF. During the admission, he was noted to have an SVT. I personally reviewed the ECG documenting the SVT. It shows a NCT with long R-P and a superiorly-directed P wave. Amiodarone was started.  He was not aware of his abnormal rhythm or rapid rates.  He has not had any indication that his heart rate or rhythm has been abnormal since discharge.  He notes that his home blood pressure often runs about 962 systolic.  This morning it was in the 170s per his home monitor. He denies missing any medications this AM.  He does not have chest pain, syncope, presyncope.  He has chronic shortness of breath due to COPD.  Past Medical History:  Diagnosis Date   Anemia    Borderline diabetic    Chronic kidney disease    Diabetes mellitus without complication ([place])    Duodenal adenoma    2021   GERD (gastroesophageal reflux disease)    Gout    Gouty arthritis    H/O small bowel obstruction    History of back surgery    Hx of adenomatous colonic polyps    2021   Hyperlipidemia    Hypertension    PE (pulmonary embolism)    Sleep apnea     Past Surgical History:  Procedure Laterality Date   ABDOMINAL SURGERY  2103   Smal bowel obstruction    ABDOMINAL SURGERY  1981   Perforated large intestine   back     Lower back   BIOPSY  08/28/2020   Procedure: BIOPSY;  Surgeon: Harvel Quale, MD;  [facility];  Service: Gastroenterology;;   BIOPSY  02/07/2022    Procedure: BIOPSY;  Surgeon: Irving Copas., MD;  Location: [facility];  Service: Gastroenterology;;   CATARACT EXTRACTION W/PHACO Right 01/06/2020   Procedure: CATARACT EXTRACTION PHACO AND INTRAOCULAR LENS PLACEMENT (IOC) (CDE: 6.48);  Surgeon: [name], MD;  Location: [facility];  Service: Ophthalmology;  Laterality: Right;   CATARACT EXTRACTION W/PHACO Left 01/20/2020   Procedure: CATARACT EXTRACTION PHACO AND INTRAOCULAR LENS PLACEMENT (IOC);  Surgeon: [name], MD;  Location: [facility];  Service: Ophthalmology;  Laterality: Left;  CDE: 5.86   COLONOSCOPY WITH PROPOFOL N/A 08/28/2020   Procedure: COLONOSCOPY WITH PROPOFOL;  Surgeon: Harvel Quale, MD;  [facility];  Service: Gastroenterology;  Laterality: N/A;  230   ENDOSCOPIC MUCOSAL RESECTION N/A 02/07/2022   Procedure: ENDOSCOPIC MUCOSAL RESECTION;  Surgeon: Rush Landmark Telford Nab., MD;  Location: [facility];  Service: Gastroenterology;  Laterality: N/A;   ESOPHAGOGASTRODUODENOSCOPY N/A 02/07/2022   Procedure: ESOPHAGOGASTRODUODENOSCOPY (EGD);  Surgeon: Irving Copas., MD;  Location: [facility];  Service: Gastroenterology;  Laterality: N/A;   ESOPHAGOGASTRODUODENOSCOPY (EGD) WITH PROPOFOL N/A 08/28/2020   Procedure: ESOPHAGOGASTRODUODENOSCOPY (EGD) WITH PROPOFOL;  Surgeon: Harvel Quale, MD;  [facility];  Service: Gastroenterology;  Laterality: N/A;   ESOPHAGOGASTRODUODENOSCOPY (EGD) WITH PROPOFOL N/A 12/14/2020  Procedure: ESOPHAGOGASTRODUODENOSCOPY (EGD) WITH PROPOFOL;  Surgeon: Rush Landmark Telford Nab., MD;  Location: [facility];  Service: Gastroenterology;  Laterality: N/A;   GIVENS CAPSULE STUDY N/A 09/10/2020   Procedure: GIVENS CAPSULE STUDY;  Surgeon: Harvel Quale, MD;  [facility];  Service: Gastroenterology;  Laterality: N/A;  [place]  12/14/2020   Procedure: HEMOSTASIS CLIP PLACEMENT;  Surgeon: Irving Copas., MD;  Location: [facility];  Service: Gastroenterology;;   HEMOSTASIS CLIP PLACEMENT  02/07/2022   Procedure: HEMOSTASIS CLIP PLACEMENT;  Surgeon: Irving Copas., MD;  Location: [facility];  Service: Gastroenterology;;   HEMOSTASIS CONTROL  12/14/2020   Procedure: HEMOSTASIS CONTROL;  Surgeon: Irving Copas., MD;  Location: [facility];  Service: Gastroenterology;;   HERNIA REPAIR     incisional   HOT HEMOSTASIS N/A 02/07/2022   Procedure: HOT HEMOSTASIS (ARGON PLASMA COAGULATION/BICAP);  Surgeon: Irving Copas., MD;  Location: [facility];  Service: Gastroenterology;  Laterality: N/A;   POLYPECTOMY  08/28/2020   Procedure: POLYPECTOMY;  Surgeon: Harvel Quale, MD;  [facility];  Service: Gastroenterology;;   POLYPECTOMY  12/14/2020   Procedure: POLYPECTOMY;  Surgeon: Irving Copas., MD;  Location: [facility];  Service: Gastroenterology;;   Clide Deutscher  12/14/2020   Procedure: Clide Deutscher;  Surgeon: Mansouraty, Telford Nab., MD;  Location: [facility];  Service: Gastroenterology;;   Maryagnes Amos INJECTION  02/07/2022   Procedure: SUBMUCOSAL LIFTING INJECTION;  Surgeon: Irving Copas., MD;  Location: [facility];  Service: Gastroenterology;;   SUBMUCOSAL TATTOO INJECTION  12/14/2020   Procedure: SUBMUCOSAL TATTOO INJECTION;  Surgeon: Irving Copas., MD;  Location: [facility];  Service: Gastroenterology;;    Current Medications: No outpatient medications have been marked as taking for the 11/07/22 encounter (Appointment) with Rickita Forstner, Yetta Barre, MD.     Allergies:   Patient has no known allergies.   Social History   Socioeconomic History   Marital status: Married    Spouse name: Not on file   Number of children: 1   Years of education: Not on file   Highest education level: Not on file  Occupational History   Occupation: Finisher  Tobacco Use   Smoking status: Former     Packs/day: 1.00    Years: 38.00    Total pack years: 38.00    Types: Cigarettes    Quit date: 02/07/1995    Years since quitting: 27.7   Smokeless tobacco: Never  Vaping Use   Vaping Use: Never used  Substance and Sexual Activity   Alcohol use: Yes    Alcohol/week: 2.0 standard drinks of alcohol    Types: 2 Shots of liquor per week    Comment: occasional    Drug use: Yes    Types: Marijuana   Sexual activity: Yes  Other Topics Concern   Not on file  Social History Narrative   Lives with wife.     Social Determinants of Health   Financial Resource Strain: Not on file  Food Insecurity: No Food Insecurity (10/20/2022)   Hunger Vital Sign    Worried About Running Out of Food in the Last Year: Never true    Ran Out of Food in the Last Year: Never true  Transportation Needs: No Transportation Needs (10/20/2022)   PRAPARE - Hydrologist (Medical): No    Lack of Transportation (Non-Medical): No  Physical Activity: Not on file  Stress: Not on file  Social Connections: Not on file  Family History: The patient's family history includes Diabetes in his mother, sister, and sister; Hypertension in his father, mother, and sister; Ulcers in his father. There is no history of Colon cancer, Esophageal cancer, Stomach cancer, Inflammatory bowel disease, Liver disease, Pancreatic cancer, or Rectal cancer.  ROS:   Please see the history of present illness.    All other systems reviewed and are negative.  EKGs/Labs/Other Studies Reviewed Today:     TTE 08/20/22: EF 60-65%. LA moderately dilated, RA mildly dilated  EKG:  Last EKG results: Normal sinus rhythm, RAD   Recent Labs: 10/20/2022: ALT 12 10/26/2022: Platelets 147 10/27/2022: B Natriuretic Peptide 259.0 10/29/2022: Magnesium 2.1; TSH 1.462 10/30/2022: BUN 74; Creatinine, Ser 2.43; Potassium 4.3; Sodium 141 11/02/2022: Hemoglobin 10.9     Physical Exam:    VS:  There were no vitals taken for  this visit.    Wt Readings from Last 3 Encounters:  10/30/22 216 lb 12.8 oz (98.3 kg)  08/19/22 211 lb 3.2 oz (95.8 kg)  08/09/22 211 lb (95.7 kg)     GEN: Well nourished, well developed in no acute distress CARDIAC: RRR, no murmurs, rubs, gallops RESPIRATORY:  Normal work of breathing; wearing O2 MUSCULOSKELETAL: no edema    ASSESSMENT & PLAN:    SVT: long-RP, occurred in the setting of acute illness. Suspect AT. Arrhythmia was possibly precipitated by acute illness. Will DC amiodarone and place monitor. Hypertension: Pt reports home Bps are elevated as well. Will start amlodipine '5mg'$  daily.         Medication Adjustments/Labs and Tests Ordered: Current medicines are reviewed at length with the patient today.  Concerns regarding medicines are outlined above.  No orders of the defined types were placed in this encounter.  No orders of the defined types were placed in this encounter.    Signed, Melida Quitter, MD  11/07/2022 1:49 PM    [place]

## 2022-11-08 DIAGNOSIS — Z23 Encounter for immunization: Secondary | ICD-10-CM | POA: Diagnosis not present

## 2022-11-11 DIAGNOSIS — M1030 Gout due to renal impairment, unspecified site: Secondary | ICD-10-CM | POA: Diagnosis not present

## 2022-11-11 DIAGNOSIS — J9611 Chronic respiratory failure with hypoxia: Secondary | ICD-10-CM | POA: Diagnosis not present

## 2022-11-11 DIAGNOSIS — E1122 Type 2 diabetes mellitus with diabetic chronic kidney disease: Secondary | ICD-10-CM | POA: Diagnosis not present

## 2022-11-11 DIAGNOSIS — I5032 Chronic diastolic (congestive) heart failure: Secondary | ICD-10-CM | POA: Diagnosis not present

## 2022-11-11 DIAGNOSIS — I2699 Other pulmonary embolism without acute cor pulmonale: Secondary | ICD-10-CM | POA: Diagnosis not present

## 2022-11-11 DIAGNOSIS — I130 Hypertensive heart and chronic kidney disease with heart failure and stage 1 through stage 4 chronic kidney disease, or unspecified chronic kidney disease: Secondary | ICD-10-CM | POA: Diagnosis not present

## 2022-11-11 DIAGNOSIS — D631 Anemia in chronic kidney disease: Secondary | ICD-10-CM | POA: Diagnosis not present

## 2022-11-11 DIAGNOSIS — N184 Chronic kidney disease, stage 4 (severe): Secondary | ICD-10-CM | POA: Diagnosis not present

## 2022-11-11 DIAGNOSIS — J441 Chronic obstructive pulmonary disease with (acute) exacerbation: Secondary | ICD-10-CM | POA: Diagnosis not present

## 2022-11-11 DIAGNOSIS — Z7901 Long term (current) use of anticoagulants: Secondary | ICD-10-CM | POA: Diagnosis not present

## 2022-11-11 DIAGNOSIS — Z9981 Dependence on supplemental oxygen: Secondary | ICD-10-CM | POA: Diagnosis not present

## 2022-11-11 DIAGNOSIS — Z794 Long term (current) use of insulin: Secondary | ICD-10-CM | POA: Diagnosis not present

## 2022-11-15 DIAGNOSIS — I471 Supraventricular tachycardia, unspecified: Secondary | ICD-10-CM

## 2022-11-17 ENCOUNTER — Other Ambulatory Visit (HOSPITAL_COMMUNITY)
Admission: RE | Admit: 2022-11-17 | Discharge: 2022-11-17 | Disposition: A | Discharge status: Home / Self Care | Payer: Medicare HMO | Source: Ambulatory Visit | Attending: Nephrology | Admitting: Nephrology

## 2022-11-17 DIAGNOSIS — Z794 Long term (current) use of insulin: Secondary | ICD-10-CM | POA: Diagnosis not present

## 2022-11-17 DIAGNOSIS — E1129 Type 2 diabetes mellitus with other diabetic kidney complication: Secondary | ICD-10-CM | POA: Diagnosis not present

## 2022-11-17 DIAGNOSIS — Z7901 Long term (current) use of anticoagulants: Secondary | ICD-10-CM | POA: Diagnosis not present

## 2022-11-17 DIAGNOSIS — N184 Chronic kidney disease, stage 4 (severe): Secondary | ICD-10-CM | POA: Diagnosis not present

## 2022-11-17 DIAGNOSIS — E211 Secondary hyperparathyroidism, not elsewhere classified: Secondary | ICD-10-CM | POA: Insufficient documentation

## 2022-11-17 DIAGNOSIS — N189 Chronic kidney disease, unspecified: Secondary | ICD-10-CM | POA: Insufficient documentation

## 2022-11-17 DIAGNOSIS — J9611 Chronic respiratory failure with hypoxia: Secondary | ICD-10-CM | POA: Diagnosis not present

## 2022-11-17 DIAGNOSIS — Z9981 Dependence on supplemental oxygen: Secondary | ICD-10-CM | POA: Diagnosis not present

## 2022-11-17 DIAGNOSIS — I5032 Chronic diastolic (congestive) heart failure: Secondary | ICD-10-CM | POA: Insufficient documentation

## 2022-11-17 DIAGNOSIS — E1122 Type 2 diabetes mellitus with diabetic chronic kidney disease: Secondary | ICD-10-CM | POA: Insufficient documentation

## 2022-11-17 DIAGNOSIS — I130 Hypertensive heart and chronic kidney disease with heart failure and stage 1 through stage 4 chronic kidney disease, or unspecified chronic kidney disease: Secondary | ICD-10-CM | POA: Insufficient documentation

## 2022-11-17 DIAGNOSIS — R809 Proteinuria, unspecified: Secondary | ICD-10-CM | POA: Insufficient documentation

## 2022-11-17 DIAGNOSIS — D631 Anemia in chronic kidney disease: Secondary | ICD-10-CM | POA: Diagnosis not present

## 2022-11-17 DIAGNOSIS — I2699 Other pulmonary embolism without acute cor pulmonale: Secondary | ICD-10-CM | POA: Diagnosis not present

## 2022-11-17 DIAGNOSIS — M1030 Gout due to renal impairment, unspecified site: Secondary | ICD-10-CM | POA: Diagnosis not present

## 2022-11-17 DIAGNOSIS — J441 Chronic obstructive pulmonary disease with (acute) exacerbation: Secondary | ICD-10-CM | POA: Diagnosis not present

## 2022-11-17 LAB — PROTEIN / CREATININE RATIO, URINE
Creatinine, Urine: 75.58 mg/dL
Protein Creatinine Ratio: 2.04 mg/mg{Cre} — ABNORMAL HIGH (ref 0.00–0.15)
Total Protein, Urine: 154 mg/dL

## 2022-11-17 LAB — RENAL FUNCTION PANEL
Albumin: 3.3 g/dL — ABNORMAL LOW (ref 3.5–5.0)
Anion gap: 11 (ref 5–15)
BUN: 19 mg/dL (ref 8–23)
CO2: 29 mmol/L (ref 22–32)
Calcium: 8.1 mg/dL — ABNORMAL LOW (ref 8.9–10.3)
Chloride: 92 mmol/L — ABNORMAL LOW (ref 98–111)
Creatinine, Ser: 2.24 mg/dL — ABNORMAL HIGH (ref 0.61–1.24)
GFR, Estimated: 29 mL/min — ABNORMAL LOW (ref >60)
Glucose, Bld: 108 mg/dL — ABNORMAL HIGH (ref 70–99)
Phosphorus: 4.1 mg/dL (ref 2.5–4.6)
Potassium: 3.7 mmol/L (ref 3.5–5.1)
Sodium: 132 mmol/L — ABNORMAL LOW (ref 135–145)

## 2022-11-17 LAB — CBC
HCT: 29.6 % — ABNORMAL LOW (ref 39.0–52.0)
Hemoglobin: 9.6 g/dL — ABNORMAL LOW (ref 13.0–17.0)
MCH: 27.7 pg (ref 26.0–34.0)
MCHC: 32.4 g/dL (ref 30.0–36.0)
MCV: 85.5 fL (ref 80.0–100.0)
Platelets: 142 10*3/uL — ABNORMAL LOW (ref 150–400)
RBC: 3.46 MIL/uL — ABNORMAL LOW (ref 4.22–5.81)
RDW: 13.6 % (ref 11.5–15.5)
WBC: 3.3 10*3/uL — ABNORMAL LOW (ref 4.0–10.5)
nRBC: 0 % (ref 0.0–0.2)

## 2022-11-17 LAB — VITAMIN D 25 HYDROXY (VIT D DEFICIENCY, FRACTURES): Vit D, 25-Hydroxy: 47.72 ng/mL (ref 30–100)

## 2022-11-17 LAB — IRON AND TIBC
Iron: 69 ug/dL (ref 45–182)
Saturation Ratios: 27 % (ref 17.9–39.5)
TIBC: 256 ug/dL (ref 250–450)
UIBC: 187 ug/dL

## 2022-11-17 LAB — FERRITIN: Ferritin: 172 ng/mL (ref 24–336)

## 2022-11-21 DIAGNOSIS — E1129 Type 2 diabetes mellitus with other diabetic kidney complication: Secondary | ICD-10-CM | POA: Diagnosis not present

## 2022-11-21 DIAGNOSIS — J441 Chronic obstructive pulmonary disease with (acute) exacerbation: Secondary | ICD-10-CM | POA: Diagnosis not present

## 2022-11-21 DIAGNOSIS — M1030 Gout due to renal impairment, unspecified site: Secondary | ICD-10-CM | POA: Diagnosis not present

## 2022-11-21 DIAGNOSIS — I130 Hypertensive heart and chronic kidney disease with heart failure and stage 1 through stage 4 chronic kidney disease, or unspecified chronic kidney disease: Secondary | ICD-10-CM | POA: Diagnosis not present

## 2022-11-21 DIAGNOSIS — N189 Chronic kidney disease, unspecified: Secondary | ICD-10-CM | POA: Diagnosis not present

## 2022-11-21 DIAGNOSIS — Z7901 Long term (current) use of anticoagulants: Secondary | ICD-10-CM | POA: Diagnosis not present

## 2022-11-21 DIAGNOSIS — Z794 Long term (current) use of insulin: Secondary | ICD-10-CM | POA: Diagnosis not present

## 2022-11-21 DIAGNOSIS — I5032 Chronic diastolic (congestive) heart failure: Secondary | ICD-10-CM | POA: Diagnosis not present

## 2022-11-21 DIAGNOSIS — R809 Proteinuria, unspecified: Secondary | ICD-10-CM | POA: Diagnosis not present

## 2022-11-21 DIAGNOSIS — N170 Acute kidney failure with tubular necrosis: Secondary | ICD-10-CM | POA: Diagnosis not present

## 2022-11-21 DIAGNOSIS — J9611 Chronic respiratory failure with hypoxia: Secondary | ICD-10-CM | POA: Diagnosis not present

## 2022-11-21 DIAGNOSIS — I2699 Other pulmonary embolism without acute cor pulmonale: Secondary | ICD-10-CM | POA: Diagnosis not present

## 2022-11-21 DIAGNOSIS — N184 Chronic kidney disease, stage 4 (severe): Secondary | ICD-10-CM | POA: Diagnosis not present

## 2022-11-21 DIAGNOSIS — D631 Anemia in chronic kidney disease: Secondary | ICD-10-CM | POA: Diagnosis not present

## 2022-11-21 DIAGNOSIS — E1122 Type 2 diabetes mellitus with diabetic chronic kidney disease: Secondary | ICD-10-CM | POA: Diagnosis not present

## 2022-11-21 DIAGNOSIS — E211 Secondary hyperparathyroidism, not elsewhere classified: Secondary | ICD-10-CM | POA: Diagnosis not present

## 2022-11-21 DIAGNOSIS — Z9981 Dependence on supplemental oxygen: Secondary | ICD-10-CM | POA: Diagnosis not present

## 2022-11-21 DIAGNOSIS — I129 Hypertensive chronic kidney disease with stage 1 through stage 4 chronic kidney disease, or unspecified chronic kidney disease: Secondary | ICD-10-CM | POA: Diagnosis not present

## 2022-11-23 DIAGNOSIS — I5032 Chronic diastolic (congestive) heart failure: Secondary | ICD-10-CM | POA: Diagnosis not present

## 2022-11-23 DIAGNOSIS — I2699 Other pulmonary embolism without acute cor pulmonale: Secondary | ICD-10-CM | POA: Diagnosis not present

## 2022-11-23 DIAGNOSIS — Z7901 Long term (current) use of anticoagulants: Secondary | ICD-10-CM | POA: Diagnosis not present

## 2022-11-23 DIAGNOSIS — J441 Chronic obstructive pulmonary disease with (acute) exacerbation: Secondary | ICD-10-CM | POA: Diagnosis not present

## 2022-11-23 DIAGNOSIS — E1122 Type 2 diabetes mellitus with diabetic chronic kidney disease: Secondary | ICD-10-CM | POA: Diagnosis not present

## 2022-11-23 DIAGNOSIS — Z9981 Dependence on supplemental oxygen: Secondary | ICD-10-CM | POA: Diagnosis not present

## 2022-11-23 DIAGNOSIS — N184 Chronic kidney disease, stage 4 (severe): Secondary | ICD-10-CM | POA: Diagnosis not present

## 2022-11-23 DIAGNOSIS — D631 Anemia in chronic kidney disease: Secondary | ICD-10-CM | POA: Diagnosis not present

## 2022-11-23 DIAGNOSIS — J9611 Chronic respiratory failure with hypoxia: Secondary | ICD-10-CM | POA: Diagnosis not present

## 2022-11-23 DIAGNOSIS — Z794 Long term (current) use of insulin: Secondary | ICD-10-CM | POA: Diagnosis not present

## 2022-11-23 DIAGNOSIS — I130 Hypertensive heart and chronic kidney disease with heart failure and stage 1 through stage 4 chronic kidney disease, or unspecified chronic kidney disease: Secondary | ICD-10-CM | POA: Diagnosis not present

## 2022-11-23 DIAGNOSIS — M1030 Gout due to renal impairment, unspecified site: Secondary | ICD-10-CM | POA: Diagnosis not present

## 2022-11-24 LAB — PTH, INTACT AND CALCIUM: Calcium, Total (PTH): 8.2 mg/dL — ABNORMAL LOW (ref 8.6–10.2)

## 2022-11-28 ENCOUNTER — Encounter: Payer: Medicare HMO | Admitting: Nurse Practitioner

## 2022-11-28 ENCOUNTER — Encounter: Payer: Self-pay | Admitting: Nurse Practitioner

## 2022-11-28 VITALS — BP 117/85 | HR 80 | Ht 69.0 in | Wt 214.4 lb

## 2022-11-28 DIAGNOSIS — E1122 Type 2 diabetes mellitus with diabetic chronic kidney disease: Secondary | ICD-10-CM | POA: Diagnosis not present

## 2022-11-28 DIAGNOSIS — M1030 Gout due to renal impairment, unspecified site: Secondary | ICD-10-CM | POA: Diagnosis not present

## 2022-11-28 DIAGNOSIS — D631 Anemia in chronic kidney disease: Secondary | ICD-10-CM | POA: Diagnosis not present

## 2022-11-28 DIAGNOSIS — I77819 Aortic ectasia, unspecified site: Secondary | ICD-10-CM | POA: Diagnosis not present

## 2022-11-28 DIAGNOSIS — I2699 Other pulmonary embolism without acute cor pulmonale: Secondary | ICD-10-CM

## 2022-11-28 DIAGNOSIS — I130 Hypertensive heart and chronic kidney disease with heart failure and stage 1 through stage 4 chronic kidney disease, or unspecified chronic kidney disease: Secondary | ICD-10-CM | POA: Diagnosis not present

## 2022-11-28 DIAGNOSIS — Z7901 Long term (current) use of anticoagulants: Secondary | ICD-10-CM | POA: Diagnosis not present

## 2022-11-28 DIAGNOSIS — I10 Essential (primary) hypertension: Secondary | ICD-10-CM

## 2022-11-28 DIAGNOSIS — I5032 Chronic diastolic (congestive) heart failure: Secondary | ICD-10-CM | POA: Diagnosis not present

## 2022-11-28 DIAGNOSIS — J449 Chronic obstructive pulmonary disease, unspecified: Secondary | ICD-10-CM

## 2022-11-28 DIAGNOSIS — J9611 Chronic respiratory failure with hypoxia: Secondary | ICD-10-CM | POA: Diagnosis not present

## 2022-11-28 DIAGNOSIS — Z794 Long term (current) use of insulin: Secondary | ICD-10-CM | POA: Diagnosis not present

## 2022-11-28 DIAGNOSIS — I471 Supraventricular tachycardia, unspecified: Secondary | ICD-10-CM

## 2022-11-28 DIAGNOSIS — J441 Chronic obstructive pulmonary disease with (acute) exacerbation: Secondary | ICD-10-CM | POA: Diagnosis not present

## 2022-11-28 DIAGNOSIS — N184 Chronic kidney disease, stage 4 (severe): Secondary | ICD-10-CM | POA: Diagnosis not present

## 2022-11-28 DIAGNOSIS — Z9981 Dependence on supplemental oxygen: Secondary | ICD-10-CM | POA: Diagnosis not present

## 2022-11-28 NOTE — Progress Notes (Unsigned)
Cardiology Office Note:    Date:  11/28/2022  ID:  [name], DOB July 20, 1946, MRN [id number]  PCP:  Lavella Lemons, [place] Prov[facility] Cardiologist:  Chalmers Guest, MD Electrophysiologist:  Melida Quitter, MD     Referring MD: Lavella Lemons, PA   CC: Here for hospital follow-up and to establish care with general caridology  History of Present Illness:    Andrew Adams is a 77 y.o. male with a hx of the following:  Hypertension History of PE, on Eliquis Chronic diastolic heart failure OSA Type 2 diabetes CKD stage IV COPD Dilated ascending aorta  Patient is a very pleasant 77 year old male with past medical history as mentioned above.  Admitted 07/2022 for acute PE that was found to be unprovoked, VQ scan was positive. Pt had hx of PE 10 years ago. Was started on [place] Lovenox and transitioned to Eliquis. Echo showed normal EF, grade 2 DD, normal RVF, mildly enlarged RV. Was also tx for acute respiratory failure with hypoxia secondary to PE and COPD exacerbation.   Presented to ED 09/2022 with complaints of progressive shortness of breath and increasing edema in bilateral lower extremities.  Was using home bronchodilators, however did not help with symptoms.  In the ED, he was noted to have 3+ pitting edema along bilateral lower extremities, diffuse wheezing.  Started on IV Lasix and treated for acute COPD exacerbation.  Was admitted for further observation.  Received doxycycline for acute COPD exacerbation along with IV Solu-Medrol.  Hospital course was noted to be complicated by SVT.  Coreg was discontinued and started on metoprolol as well as diltiazem, converted to Cardizem CD 120 mg daily.  TSH was normal.  EKG reviewed revealed SVT, nonspecific ST change.  Cardiology was consulted.  Started on amiodarone and EP evaluation placed.  After being started on amiodarone, SVT episodes improved.  Was discharged home with Lasix 40 mg daily for acute on  chronic HFpEF, Lasix was held due to uptrending serum creatinine was found to be improving.  Was recommended to follow-up with outpatient cardiology.  Saw Dr. Myles Gip earlier this month outpatient.  Dr. Myles Gip suspected atrial tachycardia, possibly precipitated by acute illness.  Amiodarone was discontinued and placed on monitor.  Blood pressure was elevated, was started on amlodipine 5 mg daily.  It appears that cardiac monitor results are not available at this time.  Today he presents for outpatient general cardiology follow-up.  He states he is doing well since hospital discharge. Denies any chest pain, shortness of breath, palpitations, syncope, presyncope, dizziness, orthopnea, PND, swelling or significant weight changes, acute bleeding, or claudication.  He is wearing chronic oxygen-2 L via nasal cannula continuously, this is new for him.  Tolerating his medications well and currently wearing his Zio monitor.  Denies any questions or concerns today.  Past Medical History:  Diagnosis Date   Anemia    Borderline diabetic    Chronic kidney disease    Diabetes mellitus without complication ([place])    Duodenal adenoma    2021   GERD (gastroesophageal reflux disease)    Gout    Gouty arthritis    H/O small bowel obstruction    History of back surgery    Hx of adenomatous colonic polyps    2021   Hyperlipidemia    Hypertension    PE (pulmonary embolism)    Sleep apnea     Past Surgical History:  Procedure Laterality Date   ABDOMINAL  SURGERY  2103   Smal bowel obstruction    ABDOMINAL SURGERY  1981   Perforated large intestine   back     Lower back   BIOPSY  08/28/2020   Procedure: BIOPSY;  Surgeon: Harvel Quale, MD;  [facility]: [facility];  Service: Gastroenterology;;   BIOPSY  02/07/2022   Procedure: BIOPSY;  Surgeon: Irving Copas., MD;  Location: [facility];  Service: Gastroenterology;;   CATARACT EXTRACTION W/PHACO Right 01/06/2020   Procedure: CATARACT  EXTRACTION PHACO AND INTRAOCULAR LENS PLACEMENT (IOC) (CDE: 6.48);  Surgeon: [name], MD;  Location: [facility];  Service: Ophthalmology;  Laterality: Right;   CATARACT EXTRACTION W/PHACO Left 01/20/2020   Procedure: CATARACT EXTRACTION PHACO AND INTRAOCULAR LENS PLACEMENT (IOC);  Surgeon: [name], MD;  Location: [facility];  Service: Ophthalmology;  Laterality: Left;  CDE: 5.86   COLONOSCOPY WITH PROPOFOL N/A 08/28/2020   Procedure: COLONOSCOPY WITH PROPOFOL;  Surgeon: Harvel Quale, MD;  [facility];  Service: Gastroenterology;  Laterality: N/A;  230   ENDOSCOPIC MUCOSAL RESECTION N/A 02/07/2022   Procedure: ENDOSCOPIC MUCOSAL RESECTION;  Surgeon: Rush Landmark Telford Nab., MD;  Location: [facility];  Service: Gastroenterology;  Laterality: N/A;   ESOPHAGOGASTRODUODENOSCOPY N/A 02/07/2022   Procedure: ESOPHAGOGASTRODUODENOSCOPY (EGD);  Surgeon: Irving Copas., MD;  Location: [facility];  Service: Gastroenterology;  Laterality: N/A;   ESOPHAGOGASTRODUODENOSCOPY (EGD) WITH PROPOFOL N/A 08/28/2020   Procedure: ESOPHAGOGASTRODUODENOSCOPY (EGD) WITH PROPOFOL;  Surgeon: Harvel Quale, MD;  [facility];  Service: Gastroenterology;  Laterality: N/A;   ESOPHAGOGASTRODUODENOSCOPY (EGD) WITH PROPOFOL N/A 12/14/2020   Procedure: ESOPHAGOGASTRODUODENOSCOPY (EGD) WITH PROPOFOL;  Surgeon: Rush Landmark Telford Nab., MD;  [facility]: [facility];  Service: Gastroenterology;  Laterality: N/A;   GIVENS CAPSULE STUDY N/A 09/10/2020   Procedure: GIVENS CAPSULE STUDY;  Surgeon: Harvel Quale, MD;  [facility];  Service: Gastroenterology;  Laterality: N/A;  [place]  12/14/2020   Procedure: HEMOSTASIS CLIP PLACEMENT;  Surgeon: Irving Copas., MD;  Location: [facility];  Service: Gastroenterology;;   HEMOSTASIS CLIP PLACEMENT  02/07/2022   Procedure: HEMOSTASIS CLIP PLACEMENT;  Surgeon: Irving Copas., MD;  Location: [facility];  Service: Gastroenterology;;   HEMOSTASIS CONTROL  12/14/2020   Procedure: HEMOSTASIS CONTROL;  Surgeon: Irving Copas., MD;  [facility]: [facility];  Service: Gastroenterology;;   HERNIA REPAIR     incisional   HOT HEMOSTASIS N/A 02/07/2022   Procedure: HOT HEMOSTASIS (ARGON PLASMA COAGULATION/BICAP);  Surgeon: Irving Copas., MD;  Location: [facility];  Service: Gastroenterology;  Laterality: N/A;   POLYPECTOMY  08/28/2020   Procedure: POLYPECTOMY;  Surgeon: Harvel Quale, MD;  [facility];  Service: Gastroenterology;;   POLYPECTOMY  12/14/2020   Procedure: POLYPECTOMY;  Surgeon: Irving Copas., MD;  Location: [facility];  Service: Gastroenterology;;   Clide Deutscher  12/14/2020   Procedure: Clide Deutscher;  Surgeon: Mansouraty, Telford Nab., MD;  Location: [facility];  Service: Gastroenterology;;   Maryagnes Amos INJECTION  02/07/2022   Procedure: SUBMUCOSAL LIFTING INJECTION;  Surgeon: Irving Copas., MD;  Location: [facility];  Service: Gastroenterology;;   SUBMUCOSAL TATTOO INJECTION  12/14/2020   Procedure: SUBMUCOSAL TATTOO INJECTION;  Surgeon: Irving Copas., MD;  Location: [facility];  Service: Gastroenterology;;    Current Medications: Current Meds  Medication Sig   albuterol (VENTOLIN HFA) 108 (90 Base) MCG/ACT inhaler Inhale 2 puffs into the lungs every 4 (four) hours as needed.   allopurinol (ZYLOPRIM) 100  MG tablet Take 1 tablet (100 mg total) by mouth daily. (Patient taking differently: Take 100 mg by mouth at bedtime.)   apixaban (ELIQUIS) 5 MG TABS tablet Take 2 tablets (10 mg total) by mouth 2 (two) times daily. On 08/27/22, start 5 mg  (1 tab) two times daily   atorvastatin (LIPITOR) 40 MG tablet Take 1 tablet (40 mg total) by mouth daily. (Patient taking differently: Take 40 mg by mouth at bedtime.)   budesonide (PULMICORT) 0.5 MG/2ML nebulizer solution Take 0.5  mg by nebulization 2 (two) times daily.   diltiazem (CARDIZEM CD) 120 MG 24 hr capsule Take 1 capsule (120 mg total) by mouth daily.   epoetin alfa-epbx (RETACRIT) 3000 UNIT/ML injection Inject 3,000 Units into the vein every 14 (fourteen) days.    FEROSUL 325 (65 Fe) MG tablet TAKE ONE TABLET BY MOUTH EVERY MORNING WITH BREAKFAST (Patient taking differently: Take 325 mg by mouth daily with breakfast.)   furosemide (LASIX) 40 MG tablet Take 1 tablet (40 mg total) by mouth daily. (Patient taking differently: Take 40 mg by mouth 2 (two) times daily.)   hydrALAZINE (APRESOLINE) 25 MG tablet Take 1 tablet (25 mg total) by mouth every 6 (six) hours. (Patient taking differently: Take 50 mg by mouth 3 (three) times daily.)   ipratropium-albuterol (DUONEB) 0.5-2.5 (3) MG/3ML SOLN Take 3 mLs by nebulization every 6 (six) hours. (Patient taking differently: Take 3 mLs by nebulization every 6 (six) hours as needed.)   metoprolol tartrate (LOPRESSOR) 50 MG tablet Take 1 tablet (50 mg total) by mouth 2 (two) times daily.   Multiple Vitamin (MULTIVITAMIN WITH MINERALS) TABS tablet Take 1 tablet by mouth daily.   pantoprazole (PROTONIX) 40 MG tablet Take 1 tablet (40 mg total) by mouth 2 (two) times daily before a meal. (Patient taking differently: Take 40 mg by mouth daily.)   potassium chloride (KLOR-CON) 10 MEQ tablet Take 10 mEq by mouth daily.   TRULICITY 3.01 SW/1.0XN SOPN Inject 0.75 mg into the skin every Friday.     Allergies:   Patient has no known allergies.   Social History   Socioeconomic History   Marital status: Married    Spouse name: Not on file   Number of children: 1   Years of education: Not on file   Highest education level: Not on file  Occupational History   Occupation: Finisher  Tobacco Use   Smoking status: Former    Packs/day: 1.00    Years: 38.00    Total pack years: 38.00    Types: Cigarettes    Quit date: 02/07/1995    Years since quitting: 27.8   Smokeless tobacco:  Never  Vaping Use   Vaping Use: Never used  Substance and Sexual Activity   Alcohol use: Yes    Alcohol/week: 2.0 standard drinks of alcohol    Types: 2 Shots of liquor per week    Comment: occasional    Drug use: Yes    Types: Marijuana   Sexual activity: Yes  Other Topics Concern   Not on file  Social History Narrative   Lives with wife.     Social Determinants of Health   Financial Resource Strain: Not on file  Food Insecurity: No Food Insecurity (10/20/2022)   Hunger Vital Sign    Worried About Running Out of Food in the Last Year: Never true    Ran Out of Food in the Last Year: Never true  Transportation Needs: No Transportation Needs (10/20/2022)   PRAPARE -  Hydrologist (Medical): No    Lack of Transportation (Non-Medical): No  Physical Activity: Not on file  Stress: Not on file  Social Connections: Not on file     Family History: The patient's family history includes Diabetes in his mother, sister, and sister; Hypertension in his father, mother, and sister; Ulcers in his father. There is no history of Colon cancer, Esophageal cancer, Stomach cancer, Inflammatory bowel disease, Liver disease, Pancreatic cancer, or Rectal cancer.  ROS:   Review of Systems  Constitutional: Negative.   HENT: Negative.    Eyes: Negative.   Respiratory: Negative.    Cardiovascular: Negative.   Gastrointestinal: Negative.   Genitourinary: Negative.   Musculoskeletal: Negative.   Skin: Negative.   Neurological: Negative.   Endo/Heme/Allergies: Negative.   Psychiatric/Behavioral: Negative.      Please see the history of present illness.    All other systems reviewed and are negative.  EKGs/Labs/Other Studies Reviewed:    The following studies were reviewed today:   EKG:  EKG is not ordered today.    Cardiac monitor - in progress   CT chest wo Contrast on 10/26/2022: IMPRESSION: 1. Moderate bilateral lower lobe collapse/consolidation may be  due to atelectasis and/or aspiration. 2. Previously seen subpleural left lower lobe nodule is obscured. Please refer to that report for further details and follow-up recommendation. 3. Small bilateral pleural effusions. 4. Aortic atherosclerosis (ICD10-I70.0). Coronary artery calcification. 5. Enlarged pulmonic trunk, indicative of pulmonary arterial hypertension.  Echocardiogram on 08/20/2022: 1. Left ventricular ejection fraction, by estimation, is 60 to 65%. The  left ventricle has normal function. The left ventricle has no regional  wall motion abnormalities. There is moderate left ventricular hypertrophy.  Left ventricular diastolic  parameters are consistent with Grade II diastolic dysfunction  (pseudonormalization). Elevated left atrial pressure.   2. Right ventricular systolic function is normal. The right ventricular  size is mildly enlarged. There is mildly elevated pulmonary artery  systolic pressure.   3. Left atrial size was moderately dilated.   4. Right atrial size was mildly dilated.   5. The mitral valve is normal in structure. No evidence of mitral valve  regurgitation. No evidence of mitral stenosis.   6. The aortic valve is tricuspid. Aortic valve regurgitation is not  visualized. Aortic valve sclerosis is present, with no evidence of aortic  valve stenosis.   7. Aortic dilatation noted. There is mild dilatation of the ascending  aorta, measuring 38 mm.  NM Pulmonary Perfusion on 08/28/2022: Pulmonary embolism present.   Recent Labs: 10/20/2022: ALT 12 10/27/2022: B Natriuretic Peptide 259.0 10/29/2022: Magnesium 2.1; TSH 1.462 11/17/2022: BUN 19; Creatinine, Ser 2.24; Hemoglobin 9.6; Platelets 142; Potassium 3.7; Sodium 132  Recent Lipid Panel    Component Value Date/Time   CHOL 150 02/24/2014 1030   CHOL 108 05/07/2013 0942   TRIG 130 02/24/2014 1030   TRIG 76 05/07/2013 0942   HDL 45 02/24/2014 1030   HDL 38 (L) 05/07/2013 0942   LDLCALC 79  02/24/2014 1030   LDLCALC 55 05/07/2013 0942     Physical Exam:    VS:  BP 117/85 (BP Location: Left Arm, Patient Position: Sitting, Cuff Size: Normal)   Pulse 80   Ht '5\' 9"'$  (1.753 m)   Wt 214 lb 6.4 oz (97.3 kg)   SpO2 97% Comment: on 2 L O2 via [place]  BMI 31.66 kg/m     Wt Readings from Last 3 Encounters:  11/28/22 214 lb 6.4 oz (  97.3 kg)  11/07/22 226 lb (102.5 kg)  10/30/22 216 lb 12.8 oz (98.3 kg)     GEN: Obese, 77 y.o. male in no acute distress, wearing 2 L of O2 via [place] HEENT: Normal NECK: No JVD; No carotid bruits LYMPHATICS: No lymphadenopathy CARDIAC: S1/S2, RRR, no murmurs, rubs, gallops; 2+ pulses RESPIRATORY:  Clear to auscultation without rales, wheezing or rhonchi  MUSCULOSKELETAL:  No edema; No deformity  SKIN: Warm and dry NEUROLOGIC:  Alert and oriented x 3 PSYCHIATRIC:  Normal affect   ASSESSMENT:    1. Chronic diastolic CHF (congestive heart failure) ([place])   2. SVT (supraventricular tachycardia)   3. Other acute pulmonary embolism, unspecified whether acute cor pulmonale present ([place])   4. Chronic anticoagulation   5. Hypertension, unspecified type   6. Chronic obstructive pulmonary disease, unspecified COPD type ([place])   7. Aortic dilatation ([facility])    PLAN:    In order of problems listed above:  Chronic diastolic CHF Echo 05/6760 showed normal EF, no RWMA, moderate LVH, and grade 2 DD. Mildly elevated PASP secondary to PE and COPD. Euvolemic and well compensated on exam. Continue Lasix, Potassium, and Lopressor. Low sodium diet, fluid restriction <2L, and daily weights encouraged. Educated to contact our office for weight gain of 2 lbs overnight or 5 lbs in one week. Heart healthy diet and regular cardiovascular exercise as tolerated encouraged. GDMT limited by hx of CKD stage IV - see below, appreciate recs.   SVT Recent hospital course complicated by SVT.  Coreg was discontinued and started on metoprolol as well as diltiazem, converted to Cardizem  CD 120 mg daily.  TSH was normal. Saw Dr. Myles Gip on 11/07/2022. SVT was felt likely to be d/t acute illness, suspected be AT. Amiodarone was d/c and placed on monitor that he is wearing now. Pt denies any tachycardia or palpitations. Continue Cardizem and Lopressor. Will await monitor results. Heart healthy diet recommended. Follow-up with EP.   Acute PE, on anticoagulation Positive NM Pulmonary Perfusion scan 07/2022. Tolerating Eliquis well and denies any bleeding issues. Hx of PE in past. Continue Eliquis 5 mg BID, on appropriate dosage. Continue to follow with PCP.   HTN BP on arrival 148/80, repeat BP 117/85. BP well controlled at home. Discussed to monitor BP at home at least 2 hours after medications and sitting for 5-10 minutes. Continue current medication regimen. Low salt, heart healthy diet recommended.   COPD, chronic oxygen Stable and denies any recent worsening shortness of breath. On chronic O2.  Continue current medication regimen. Continue to follow with PCP. Low salt, heart healthy diet recommended.   Dilated aorta Echo 07/2022 revealed mild dilatation of ascending aorta, measuring 38 mm. Plan to update Echo 08/2023 for monitoring. Heart healthy diet recommended.   7. Disposition: Follow-up with Dr. Dellia Cloud in 6-8 weeks or sooner if anything changes.    Medication Adjustments/Labs and Tests Ordered: Current medicines are reviewed at length with the patient today.  Concerns regarding medicines are outlined above.  No orders of the defined types were placed in this encounter.  No orders of the defined types were placed in this encounter.   Patient Instructions  Medication Instructions:  Your physician recommends that you continue on your current medications as directed. Please refer to the Current Medication list given to you today.  Labwork: none  Testing/Procedures: none  Follow-Up: Your physician recommends that you schedule a follow-up appointment in: 6-8 weeks  with Dr. Dellia Cloud  Any Other Special Instructions Will Be  Listed Below (If Applicable).  If you need a refill on your cardiac medications before your next appointment, please call your pharmacy.   SignedFinis Bud, NP  11/30/2022 10:42 AM    [place]

## 2022-11-28 NOTE — Patient Instructions (Addendum)
Medication Instructions:  Your physician recommends that you continue on your current medications as directed. Please refer to the Current Medication list given to you today.  Labwork: none  Testing/Procedures: none  Follow-Up: Your physician recommends that you schedule a follow-up appointment in: 6-8 weeks with Dr. Dellia Cloud  Any Other Special Instructions Will Be Listed Below (If Applicable).  If you need a refill on your cardiac medications before your next appointment, please call your pharmacy.

## 2022-11-29 DIAGNOSIS — N1832 Chronic kidney disease, stage 3b: Secondary | ICD-10-CM | POA: Diagnosis not present

## 2022-11-29 DIAGNOSIS — I471 Supraventricular tachycardia, unspecified: Secondary | ICD-10-CM | POA: Diagnosis not present

## 2022-11-29 DIAGNOSIS — E1122 Type 2 diabetes mellitus with diabetic chronic kidney disease: Secondary | ICD-10-CM | POA: Diagnosis not present

## 2022-11-29 DIAGNOSIS — N189 Chronic kidney disease, unspecified: Secondary | ICD-10-CM | POA: Diagnosis not present

## 2022-11-29 DIAGNOSIS — J449 Chronic obstructive pulmonary disease, unspecified: Secondary | ICD-10-CM | POA: Diagnosis not present

## 2022-11-29 DIAGNOSIS — Z6832 Body mass index (BMI) 32.0-32.9, adult: Secondary | ICD-10-CM | POA: Diagnosis not present

## 2022-11-29 DIAGNOSIS — R030 Elevated blood-pressure reading, without diagnosis of hypertension: Secondary | ICD-10-CM | POA: Diagnosis not present

## 2022-11-29 DIAGNOSIS — I5032 Chronic diastolic (congestive) heart failure: Secondary | ICD-10-CM | POA: Diagnosis not present

## 2022-11-29 DIAGNOSIS — I2699 Other pulmonary embolism without acute cor pulmonale: Secondary | ICD-10-CM | POA: Diagnosis not present

## 2022-11-30 ENCOUNTER — Encounter: Payer: Self-pay | Admitting: Nurse Practitioner

## 2022-11-30 DIAGNOSIS — J441 Chronic obstructive pulmonary disease with (acute) exacerbation: Secondary | ICD-10-CM | POA: Diagnosis not present

## 2022-11-30 DIAGNOSIS — Z9981 Dependence on supplemental oxygen: Secondary | ICD-10-CM | POA: Diagnosis not present

## 2022-11-30 DIAGNOSIS — I2699 Other pulmonary embolism without acute cor pulmonale: Secondary | ICD-10-CM | POA: Diagnosis not present

## 2022-11-30 DIAGNOSIS — N184 Chronic kidney disease, stage 4 (severe): Secondary | ICD-10-CM | POA: Diagnosis not present

## 2022-11-30 DIAGNOSIS — I130 Hypertensive heart and chronic kidney disease with heart failure and stage 1 through stage 4 chronic kidney disease, or unspecified chronic kidney disease: Secondary | ICD-10-CM | POA: Diagnosis not present

## 2022-11-30 DIAGNOSIS — M1030 Gout due to renal impairment, unspecified site: Secondary | ICD-10-CM | POA: Diagnosis not present

## 2022-11-30 DIAGNOSIS — D631 Anemia in chronic kidney disease: Secondary | ICD-10-CM | POA: Diagnosis not present

## 2022-11-30 DIAGNOSIS — I5032 Chronic diastolic (congestive) heart failure: Secondary | ICD-10-CM | POA: Diagnosis not present

## 2022-11-30 DIAGNOSIS — J449 Chronic obstructive pulmonary disease, unspecified: Secondary | ICD-10-CM | POA: Diagnosis not present

## 2022-11-30 DIAGNOSIS — E1122 Type 2 diabetes mellitus with diabetic chronic kidney disease: Secondary | ICD-10-CM | POA: Diagnosis not present

## 2022-11-30 DIAGNOSIS — J9611 Chronic respiratory failure with hypoxia: Secondary | ICD-10-CM | POA: Diagnosis not present

## 2022-11-30 DIAGNOSIS — Z7901 Long term (current) use of anticoagulants: Secondary | ICD-10-CM | POA: Diagnosis not present

## 2022-11-30 DIAGNOSIS — Z794 Long term (current) use of insulin: Secondary | ICD-10-CM | POA: Diagnosis not present

## 2022-12-05 ENCOUNTER — Encounter (HOSPITAL_COMMUNITY)
Admission: RE | Admit: 2022-12-05 | Discharge: 2022-12-05 | Disposition: A | Discharge status: Home / Self Care | Payer: Medicare HMO | Source: Ambulatory Visit | Attending: Nephrology | Admitting: Nephrology

## 2022-12-05 VITALS — BP 137/77 | HR 72 | Temp 98.8°F | Resp 16

## 2022-12-05 DIAGNOSIS — D631 Anemia in chronic kidney disease: Secondary | ICD-10-CM | POA: Insufficient documentation

## 2022-12-05 DIAGNOSIS — Z7901 Long term (current) use of anticoagulants: Secondary | ICD-10-CM | POA: Diagnosis not present

## 2022-12-05 DIAGNOSIS — I2699 Other pulmonary embolism without acute cor pulmonale: Secondary | ICD-10-CM | POA: Diagnosis not present

## 2022-12-05 DIAGNOSIS — N189 Chronic kidney disease, unspecified: Secondary | ICD-10-CM | POA: Insufficient documentation

## 2022-12-05 DIAGNOSIS — I5032 Chronic diastolic (congestive) heart failure: Secondary | ICD-10-CM | POA: Diagnosis not present

## 2022-12-05 DIAGNOSIS — E1122 Type 2 diabetes mellitus with diabetic chronic kidney disease: Secondary | ICD-10-CM | POA: Diagnosis not present

## 2022-12-05 DIAGNOSIS — Z794 Long term (current) use of insulin: Secondary | ICD-10-CM | POA: Diagnosis not present

## 2022-12-05 DIAGNOSIS — J441 Chronic obstructive pulmonary disease with (acute) exacerbation: Secondary | ICD-10-CM | POA: Diagnosis not present

## 2022-12-05 DIAGNOSIS — Z9981 Dependence on supplemental oxygen: Secondary | ICD-10-CM | POA: Diagnosis not present

## 2022-12-05 DIAGNOSIS — Z862 Personal history of diseases of the blood and blood-forming organs and certain disorders involving the immune mechanism: Secondary | ICD-10-CM | POA: Insufficient documentation

## 2022-12-05 DIAGNOSIS — J9611 Chronic respiratory failure with hypoxia: Secondary | ICD-10-CM | POA: Diagnosis not present

## 2022-12-05 DIAGNOSIS — I130 Hypertensive heart and chronic kidney disease with heart failure and stage 1 through stage 4 chronic kidney disease, or unspecified chronic kidney disease: Secondary | ICD-10-CM | POA: Diagnosis not present

## 2022-12-05 DIAGNOSIS — M1030 Gout due to renal impairment, unspecified site: Secondary | ICD-10-CM | POA: Diagnosis not present

## 2022-12-05 DIAGNOSIS — N184 Chronic kidney disease, stage 4 (severe): Secondary | ICD-10-CM | POA: Diagnosis not present

## 2022-12-05 LAB — POCT HEMOGLOBIN-HEMACUE: Hemoglobin: 8.9 g/dL — ABNORMAL LOW (ref 13.0–17.0)

## 2022-12-05 MED ORDER — EPOETIN ALFA-EPBX 3000 UNIT/ML IJ SOLN
3000.0000 [IU] | Freq: Once | INTRAMUSCULAR | Status: AC
  Administered 2022-12-05: 3000 [IU] via SUBCUTANEOUS

## 2022-12-05 NOTE — Progress Notes (Signed)
Diagnosis: Anemia in Chronic Kidney Disease  Provider:  Manpreet Bhutani MD  Procedure: Injection  Retacrit (epoetin alfa-epbx), Dose: 3000 Units, Site: subcutaneous, Number of injections: 1  Hgb 8.9  Post Care: Patient declined observation  Discharge: Condition: Good, Destination: Home . AVS provided to patient.   Performed by:  Baxter Hire, RN

## 2022-12-06 DIAGNOSIS — I471 Supraventricular tachycardia, unspecified: Secondary | ICD-10-CM | POA: Diagnosis not present

## 2022-12-12 DIAGNOSIS — I130 Hypertensive heart and chronic kidney disease with heart failure and stage 1 through stage 4 chronic kidney disease, or unspecified chronic kidney disease: Secondary | ICD-10-CM | POA: Diagnosis not present

## 2022-12-12 DIAGNOSIS — E1122 Type 2 diabetes mellitus with diabetic chronic kidney disease: Secondary | ICD-10-CM | POA: Diagnosis not present

## 2022-12-12 DIAGNOSIS — Z7901 Long term (current) use of anticoagulants: Secondary | ICD-10-CM | POA: Diagnosis not present

## 2022-12-12 DIAGNOSIS — D631 Anemia in chronic kidney disease: Secondary | ICD-10-CM | POA: Diagnosis not present

## 2022-12-12 DIAGNOSIS — J9611 Chronic respiratory failure with hypoxia: Secondary | ICD-10-CM | POA: Diagnosis not present

## 2022-12-12 DIAGNOSIS — J441 Chronic obstructive pulmonary disease with (acute) exacerbation: Secondary | ICD-10-CM | POA: Diagnosis not present

## 2022-12-12 DIAGNOSIS — M1030 Gout due to renal impairment, unspecified site: Secondary | ICD-10-CM | POA: Diagnosis not present

## 2022-12-12 DIAGNOSIS — Z9981 Dependence on supplemental oxygen: Secondary | ICD-10-CM | POA: Diagnosis not present

## 2022-12-12 DIAGNOSIS — I5032 Chronic diastolic (congestive) heart failure: Secondary | ICD-10-CM | POA: Diagnosis not present

## 2022-12-12 DIAGNOSIS — N184 Chronic kidney disease, stage 4 (severe): Secondary | ICD-10-CM | POA: Diagnosis not present

## 2022-12-12 DIAGNOSIS — Z794 Long term (current) use of insulin: Secondary | ICD-10-CM | POA: Diagnosis not present

## 2022-12-12 DIAGNOSIS — I2699 Other pulmonary embolism without acute cor pulmonale: Secondary | ICD-10-CM | POA: Diagnosis not present

## 2022-12-13 DIAGNOSIS — J9611 Chronic respiratory failure with hypoxia: Secondary | ICD-10-CM | POA: Diagnosis not present

## 2022-12-13 DIAGNOSIS — I130 Hypertensive heart and chronic kidney disease with heart failure and stage 1 through stage 4 chronic kidney disease, or unspecified chronic kidney disease: Secondary | ICD-10-CM | POA: Diagnosis not present

## 2022-12-13 DIAGNOSIS — N184 Chronic kidney disease, stage 4 (severe): Secondary | ICD-10-CM | POA: Diagnosis not present

## 2022-12-13 DIAGNOSIS — I5032 Chronic diastolic (congestive) heart failure: Secondary | ICD-10-CM | POA: Diagnosis not present

## 2022-12-13 DIAGNOSIS — E1122 Type 2 diabetes mellitus with diabetic chronic kidney disease: Secondary | ICD-10-CM | POA: Diagnosis not present

## 2022-12-13 DIAGNOSIS — I2699 Other pulmonary embolism without acute cor pulmonale: Secondary | ICD-10-CM | POA: Diagnosis not present

## 2022-12-13 DIAGNOSIS — J441 Chronic obstructive pulmonary disease with (acute) exacerbation: Secondary | ICD-10-CM | POA: Diagnosis not present

## 2022-12-13 DIAGNOSIS — D631 Anemia in chronic kidney disease: Secondary | ICD-10-CM | POA: Diagnosis not present

## 2022-12-29 ENCOUNTER — Telehealth: Payer: Self-pay | Admitting: Nurse Practitioner

## 2022-12-29 ENCOUNTER — Encounter: Payer: Self-pay | Admitting: Cardiovascular Disease

## 2022-12-29 DIAGNOSIS — K219 Gastro-esophageal reflux disease without esophagitis: Secondary | ICD-10-CM | POA: Diagnosis not present

## 2022-12-29 DIAGNOSIS — I10 Essential (primary) hypertension: Secondary | ICD-10-CM | POA: Diagnosis not present

## 2022-12-29 DIAGNOSIS — J449 Chronic obstructive pulmonary disease, unspecified: Secondary | ICD-10-CM | POA: Diagnosis not present

## 2022-12-29 DIAGNOSIS — E782 Mixed hyperlipidemia: Secondary | ICD-10-CM | POA: Diagnosis not present

## 2022-12-29 NOTE — Telephone Encounter (Signed)
Per Andrew Adams, pharmacist at [facility], patient is currently taking amlodipine 5 mg daily with diltiazem 120 mg daily. Advised that amlodipine was removed from list at last visit and we were unaware that patient continued amlodipine. Per Dawn, medications are pre-packaged through [facility] and all prescriptions should be sent to this pharmacy instead of [facility]. Will send to provider for clarification and contact patient and Dawn with response.

## 2022-12-29 NOTE — Telephone Encounter (Signed)
Pt c/o medication issue:  1. Name of Medication: diltiazem (CARDIZEM CD) 120 MG 24 hr capsule  Amlodipine   2. How are you currently taking this medication (dosage and times per day)?   3. Are you having a reaction (difficulty breathing--STAT)?   4. What is your medication issue?  Dawn from [facility] calling to confirm if pt is supposed to be on diltiazem and amlodipine. Informed her amlodipine was taken off med list at last appt, but she states pt has still been taking it.

## 2022-12-29 NOTE — Telephone Encounter (Signed)
error 

## 2023-01-02 ENCOUNTER — Encounter: Payer: Self-pay | Admitting: Cardiovascular Disease

## 2023-01-02 ENCOUNTER — Ambulatory Visit: Payer: Medicare HMO | Attending: Cardiovascular Disease | Admitting: Cardiovascular Disease

## 2023-01-02 VITALS — BP 138/82 | HR 77 | Ht 69.0 in | Wt 223.6 lb

## 2023-01-02 DIAGNOSIS — Z7901 Long term (current) use of anticoagulants: Secondary | ICD-10-CM | POA: Diagnosis not present

## 2023-01-02 DIAGNOSIS — I471 Supraventricular tachycardia, unspecified: Secondary | ICD-10-CM

## 2023-01-02 NOTE — Progress Notes (Signed)
Electrophysiology Office Note:    Date:  01/02/2023   ID:  Andrew Adams, DOB 01/28/1946, MRN [id number]  PCP:  Lavella Lemons, [place] Providers Cardiologist:  Chalmers Guest, MD Electrophysiologist:  Melida Quitter, MD     Referring MD: Lavella Lemons, PA   History of Present Illness:    Andrew Adams is a 77 y.o. male with a hx listed below, significant for CKD IV, DM, HTN, OSA, HFpEF, and SVT referred for arrhythmia management.  He was admitted in Dec 2023 with COPD exacerbation and acute CHFpEF. During the admission, he was noted to have an SVT. I personally reviewed the ECG documenting the SVT. It shows a NCT with long R-P and a superiorly-directed P wave. Amiodarone was started.  He was not aware of his abnormal rhythm or rapid rates.  He has not had any indication that his heart rate or rhythm has been abnormal since discharge.  He notes that his home blood pressure often runs about 0000000 systolic.  This morning it was in the 170s per his home monitor. He denies missing any medications this AM.  He does not have chest pain, syncope, presyncope.  He has chronic shortness of breath due to COPD.  Past Medical History:  Diagnosis Date   Anemia    Borderline diabetic    Chronic kidney disease    Diabetes mellitus without complication ([place])    Duodenal adenoma    2021   GERD (gastroesophageal reflux disease)    Gout    Gouty arthritis    H/O small bowel obstruction    History of back surgery    Hx of adenomatous colonic polyps    2021   Hyperlipidemia    Hypertension    PE (pulmonary embolism)    Sleep apnea     Past Surgical History:  Procedure Laterality Date   ABDOMINAL SURGERY  2103   Smal bowel obstruction    ABDOMINAL SURGERY  1981   Perforated large intestine   back     Lower back   BIOPSY  08/28/2020   Procedure: BIOPSY;  Surgeon: Harvel Quale, MD;  [facility];  Service: Gastroenterology;;    BIOPSY  02/07/2022   Procedure: BIOPSY;  Surgeon: Irving Copas., MD;  Location: [facility];  Service: Gastroenterology;;   CATARACT EXTRACTION W/PHACO Right 01/06/2020   Procedure: CATARACT EXTRACTION PHACO AND INTRAOCULAR LENS PLACEMENT (IOC) (CDE: 6.48);  Surgeon: [name], MD;  Location: [facility];  Service: Ophthalmology;  Laterality: Right;   CATARACT EXTRACTION W/PHACO Left 01/20/2020   Procedure: CATARACT EXTRACTION PHACO AND INTRAOCULAR LENS PLACEMENT (IOC);  Surgeon: [name], MD;  Location: [facility];  Service: Ophthalmology;  Laterality: Left;  CDE: 5.86   COLONOSCOPY WITH PROPOFOL N/A 08/28/2020   Procedure: COLONOSCOPY WITH PROPOFOL;  Surgeon: Harvel Quale, MD;  [facility];  Service: Gastroenterology;  Laterality: N/A;  230   ENDOSCOPIC MUCOSAL RESECTION N/A 02/07/2022   Procedure: ENDOSCOPIC MUCOSAL RESECTION;  Surgeon: Rush Landmark Telford Nab., MD;  Location: [facility];  Service: Gastroenterology;  Laterality: N/A;   ESOPHAGOGASTRODUODENOSCOPY N/A 02/07/2022   Procedure: ESOPHAGOGASTRODUODENOSCOPY (EGD);  Surgeon: Irving Copas., MD;  Location: [facility];  Service: Gastroenterology;  Laterality: N/A;   ESOPHAGOGASTRODUODENOSCOPY (EGD) WITH PROPOFOL N/A 08/28/2020   Procedure: ESOPHAGOGASTRODUODENOSCOPY (EGD) WITH PROPOFOL;  Surgeon: Harvel Quale, MD;  [facility];  Service: Gastroenterology;  Laterality: N/A;   ESOPHAGOGASTRODUODENOSCOPY (EGD) WITH PROPOFOL  N/A 12/14/2020   Procedure: ESOPHAGOGASTRODUODENOSCOPY (EGD) WITH PROPOFOL;  Surgeon: Rush Landmark Telford Nab., MD;  Location: [facility];  Service: Gastroenterology;  Laterality: N/A;   GIVENS CAPSULE STUDY N/A 09/10/2020   Procedure: GIVENS CAPSULE STUDY;  Surgeon: Harvel Quale, MD;  [facility];  Service: Gastroenterology;  Laterality: N/A;  [place]  12/14/2020   Procedure: HEMOSTASIS CLIP PLACEMENT;   Surgeon: Irving Copas., MD;  Location: [facility];  Service: Gastroenterology;;   HEMOSTASIS CLIP PLACEMENT  02/07/2022   Procedure: HEMOSTASIS CLIP PLACEMENT;  Surgeon: Irving Copas., MD;  Location: [facility];  Service: Gastroenterology;;   HEMOSTASIS CONTROL  12/14/2020   Procedure: HEMOSTASIS CONTROL;  Surgeon: Irving Copas., MD;  Location: [facility];  Service: Gastroenterology;;   HERNIA REPAIR     incisional   HOT HEMOSTASIS N/A 02/07/2022   Procedure: HOT HEMOSTASIS (ARGON PLASMA COAGULATION/BICAP);  Surgeon: Irving Copas., MD;  Location: [facility];  Service: Gastroenterology;  Laterality: N/A;   POLYPECTOMY  08/28/2020   Procedure: POLYPECTOMY;  Surgeon: Harvel Quale, MD;  [facility];  Service: Gastroenterology;;   POLYPECTOMY  12/14/2020   Procedure: POLYPECTOMY;  Surgeon: Irving Copas., MD;  Location: [facility];  Service: Gastroenterology;;   Clide Deutscher  12/14/2020   Procedure: Clide Deutscher;  Surgeon: Mansouraty, Telford Nab., MD;  Location: [facility];  Service: Gastroenterology;;   Maryagnes Amos INJECTION  02/07/2022   Procedure: SUBMUCOSAL LIFTING INJECTION;  Surgeon: Irving Copas., MD;  Location: [facility];  Service: Gastroenterology;;   SUBMUCOSAL TATTOO INJECTION  12/14/2020   Procedure: SUBMUCOSAL TATTOO INJECTION;  Surgeon: Irving Copas., MD;  Location: [facility];  Service: Gastroenterology;;    Current Medications: Current Meds  Medication Sig   albuterol (VENTOLIN HFA) 108 (90 Base) MCG/ACT inhaler Inhale 2 puffs into the lungs every 4 (four) hours as needed.   allopurinol (ZYLOPRIM) 100 MG tablet Take 1 tablet (100 mg total) by mouth daily.   apixaban (ELIQUIS) 5 MG TABS tablet Take 2 tablets (10 mg total) by mouth 2 (two) times daily. On 08/27/22, start 5 mg  (1 tab) two times daily   atorvastatin (LIPITOR) 40 MG tablet Take 1 tablet (40 mg total) by  mouth daily.   budesonide (PULMICORT) 0.5 MG/2ML nebulizer solution Take 0.5 mg by nebulization 2 (two) times daily.   diltiazem (CARDIZEM CD) 120 MG 24 hr capsule Take 1 capsule (120 mg total) by mouth daily.   epoetin alfa-epbx (RETACRIT) 3000 UNIT/ML injection Inject 3,000 Units into the vein every 14 (fourteen) days.    FEROSUL 325 (65 Fe) MG tablet TAKE ONE TABLET BY MOUTH EVERY MORNING WITH BREAKFAST   furosemide (LASIX) 40 MG tablet Take 1 tablet (40 mg total) by mouth daily.   hydrALAZINE (APRESOLINE) 25 MG tablet Take 1 tablet (25 mg total) by mouth every 6 (six) hours.   ipratropium-albuterol (DUONEB) 0.5-2.5 (3) MG/3ML SOLN Take 3 mLs by nebulization every 6 (six) hours.   metoprolol tartrate (LOPRESSOR) 50 MG tablet Take 1 tablet (50 mg total) by mouth 2 (two) times daily.   Multiple Vitamin (MULTIVITAMIN WITH MINERALS) TABS tablet Take 1 tablet by mouth daily.   pantoprazole (PROTONIX) 40 MG tablet Take 1 tablet (40 mg total) by mouth 2 (two) times daily before a meal.   potassium chloride (KLOR-CON) 10 MEQ tablet Take 10 mEq by mouth daily.   predniSONE (DELTASONE) 20 MG tablet Take 1 tablet (20 mg total) by mouth daily with  breakfast.   TRULICITY [id number] SOPN Inject 0.75 mg into the skin every Friday.     Allergies:   Patient has no known allergies.   Social History   Socioeconomic History   Marital status: Married    Spouse name: Not on file   Number of children: 1   Years of education: Not on file   Highest education level: Not on file  Occupational History   Occupation: Finisher  Tobacco Use   Smoking status: Former    Packs/day: 1.00    Years: 38.00    Total pack years: 38.00    Types: Cigarettes    Quit date: 02/07/1995    Years since quitting: 27.9   Smokeless tobacco: Never  Vaping Use   Vaping Use: Never used  Substance and Sexual Activity   Alcohol use: Yes    Alcohol/week: 2.0 standard drinks of alcohol    Types: 2 Shots of liquor per week     Comment: occasional    Drug use: Yes    Types: Marijuana   Sexual activity: Yes  Other Topics Concern   Not on file  Social History Narrative   Lives with wife.     Social Determinants of Health   Financial Resource Strain: Not on file  Food Insecurity: No Food Insecurity (10/20/2022)   Hunger Vital Sign    Worried About Running Out of Food in the Last Year: Never true    Ran Out of Food in the Last Year: Never true  Transportation Needs: No Transportation Needs (10/20/2022)   PRAPARE - Hydrologist (Medical): No    Lack of Transportation (Non-Medical): No  Physical Activity: Not on file  Stress: Not on file  Social Connections: Not on file     Family History: The patient's family history includes Diabetes in his mother, sister, and sister; Hypertension in his father, mother, and sister; Ulcers in his father. There is no history of Colon cancer, Esophageal cancer, Stomach cancer, Inflammatory bowel disease, Liver disease, Pancreatic cancer, or Rectal cancer.  ROS:   Please see the history of present illness.    All other systems reviewed and are negative.  EKGs/Labs/Other Studies Reviewed Today:     TTE 08/20/22: EF 60-65%. LA moderately dilated, RA mildly dilated  Monitor: 13 day monitor 12/13/2021 Sinus rhythm HR 64-104, avg 80  EKG:  Last EKG results: Normal sinus rhythm, RAD   Recent Labs: 10/20/2022: ALT 12 10/27/2022: B Natriuretic Peptide 259.0 10/29/2022: Magnesium 2.1; TSH 1.462 11/17/2022: BUN 19; Creatinine, Ser 2.24; Platelets 142; Potassium 3.7; Sodium 132 12/05/2022: Hemoglobin 8.9     Physical Exam:    VS:  BP 138/82   Pulse 77   Ht '5\' 9"'$  (1.753 m)   Wt 223 lb 9.6 oz (101.4 kg)   SpO2 96%   BMI 33.02 kg/m     Wt Readings from Last 3 Encounters:  01/02/23 223 lb 9.6 oz (101.4 kg)  11/28/22 214 lb 6.4 oz (97.3 kg)  11/07/22 226 lb (102.5 kg)     GEN: Well nourished, well developed in no acute distress CARDIAC:  RRR, no murmurs, rubs, gallops RESPIRATORY:  Normal work of breathing; wearing O2 MUSCULOSKELETAL: no edema    ASSESSMENT & PLAN:    SVT: long-RP, occurred in the setting of acute illness. Suspect AT. Arrhythmia was possibly precipitated by acute illness. No arrhythmia occurred during subsequent monitoring off of amiodarone. AT is difficult to map unless it is incessant. I asked  him to monitor his heart rate daily and call us if it is over 110 bpm at rest. Hypertension: It appears that amlodipine was discontinued. BP is borderline elevate today. He may follow-up with his PCP or general cardiologist. PE: on eliquis        Medication Adjustments/Labs and Tests Ordered: Current medicines are reviewed at length with the patient today.  Concerns regarding medicines are outlined above.  No orders of the defined types were placed in this encounter.  No orders of the defined types were placed in this encounter.    Signed, Melida Quitter, MD  01/02/2023 1:59 PM    [place]

## 2023-01-02 NOTE — Patient Instructions (Signed)
Medication Instructions:  Your physician recommends that you continue on your current medications as directed. Please refer to the Current Medication list given to you today.  *If you need a refill on your cardiac medications before your next appointment, please call your pharmacy*  Lab Work: None ordered  Testing/Procedures: None ordered  Follow-Up: At [facility], you and your health needs are our priority.  As part of our continuing mission to provide you with exceptional heart care, we have created designated Provider Care Teams.  These Care Teams include your primary Cardiologist (physician) and Advanced Practice Providers (APPs -  Physician Assistants and Nurse Practitioners) who all work together to provide you with the care you need, when you need it.  Your next appointment:   1 year(s)  The format for your next appointment:   In Person  Provider:   You may see Melida Quitter, MD or one of the following Advanced Practice Providers on your designated Care Team:   Tommye Standard, [place] Legrand Como "[place]" [place], PA-C [place], [place]

## 2023-01-03 ENCOUNTER — Encounter (HOSPITAL_COMMUNITY)
Admission: RE | Admit: 2023-01-03 | Discharge: 2023-01-03 | Disposition: A | Discharge status: Home / Self Care | Payer: Medicare HMO | Source: Ambulatory Visit | Attending: Nephrology | Admitting: Nephrology

## 2023-01-03 VITALS — BP 142/86 | HR 72 | Temp 98.3°F | Resp 12

## 2023-01-03 DIAGNOSIS — I5032 Chronic diastolic (congestive) heart failure: Secondary | ICD-10-CM | POA: Insufficient documentation

## 2023-01-03 DIAGNOSIS — J449 Chronic obstructive pulmonary disease, unspecified: Secondary | ICD-10-CM | POA: Insufficient documentation

## 2023-01-03 DIAGNOSIS — D631 Anemia in chronic kidney disease: Secondary | ICD-10-CM | POA: Diagnosis not present

## 2023-01-03 DIAGNOSIS — I130 Hypertensive heart and chronic kidney disease with heart failure and stage 1 through stage 4 chronic kidney disease, or unspecified chronic kidney disease: Secondary | ICD-10-CM | POA: Diagnosis not present

## 2023-01-03 DIAGNOSIS — I4719 Other supraventricular tachycardia: Secondary | ICD-10-CM | POA: Insufficient documentation

## 2023-01-03 DIAGNOSIS — Z862 Personal history of diseases of the blood and blood-forming organs and certain disorders involving the immune mechanism: Secondary | ICD-10-CM | POA: Insufficient documentation

## 2023-01-03 DIAGNOSIS — N189 Chronic kidney disease, unspecified: Secondary | ICD-10-CM | POA: Diagnosis present

## 2023-01-03 DIAGNOSIS — Z86711 Personal history of pulmonary embolism: Secondary | ICD-10-CM | POA: Diagnosis not present

## 2023-01-03 DIAGNOSIS — I77819 Aortic ectasia, unspecified site: Secondary | ICD-10-CM | POA: Insufficient documentation

## 2023-01-03 DIAGNOSIS — Z79899 Other long term (current) drug therapy: Secondary | ICD-10-CM | POA: Insufficient documentation

## 2023-01-03 DIAGNOSIS — E1122 Type 2 diabetes mellitus with diabetic chronic kidney disease: Secondary | ICD-10-CM | POA: Diagnosis not present

## 2023-01-03 DIAGNOSIS — Z833 Family history of diabetes mellitus: Secondary | ICD-10-CM | POA: Insufficient documentation

## 2023-01-03 DIAGNOSIS — Z8249 Family history of ischemic heart disease and other diseases of the circulatory system: Secondary | ICD-10-CM | POA: Insufficient documentation

## 2023-01-03 DIAGNOSIS — Z7901 Long term (current) use of anticoagulants: Secondary | ICD-10-CM | POA: Insufficient documentation

## 2023-01-03 DIAGNOSIS — G4733 Obstructive sleep apnea (adult) (pediatric): Secondary | ICD-10-CM | POA: Diagnosis not present

## 2023-01-03 DIAGNOSIS — E785 Hyperlipidemia, unspecified: Secondary | ICD-10-CM | POA: Insufficient documentation

## 2023-01-03 LAB — POCT HEMOGLOBIN-HEMACUE: Hemoglobin: 8.8 g/dL — ABNORMAL LOW (ref 13.0–17.0)

## 2023-01-03 MED ORDER — EPOETIN ALFA-EPBX 3000 UNIT/ML IJ SOLN
3000.0000 [IU] | Freq: Once | INTRAMUSCULAR | Status: AC
  Administered 2023-01-03: 3000 [IU] via SUBCUTANEOUS
  Filled 2023-01-03: qty 1

## 2023-01-03 NOTE — Addendum Note (Signed)
Encounter addended by: Baxter Hire, RN on: 01/03/2023 3:33 PM  Actions taken: Therapy plan modified

## 2023-01-03 NOTE — Progress Notes (Signed)
Diagnosis: Anemia in Chronic Kidney Disease  Provider:  Manpreet Bhutani MD  Procedure: Injection  Retacrit (epoetin alfa-epbx), Dose: 3000 Units, Site: subcutaneous, Number of injections: 1  Hgb 8.8   Post Care: Patient declined observation  Discharge: Condition: Good, Destination: Home . AVS Provided  Performed by:  Baxter Hire, RN

## 2023-01-09 NOTE — Telephone Encounter (Signed)
Medication profile updated with amlodipine 5 mg Dawn, pharmacist at [facility] advised that medication profile update and message would be routed to [facility] as well.

## 2023-01-13 ENCOUNTER — Other Ambulatory Visit (HOSPITAL_COMMUNITY)
Admission: RE | Admit: 2023-01-13 | Discharge: 2023-01-13 | Disposition: A | Discharge status: Home / Self Care | Payer: Medicare HMO | Source: Ambulatory Visit | Attending: Nephrology | Admitting: Nephrology

## 2023-01-13 DIAGNOSIS — N189 Chronic kidney disease, unspecified: Secondary | ICD-10-CM | POA: Insufficient documentation

## 2023-01-13 LAB — RENAL FUNCTION PANEL
Albumin: 3.7 g/dL (ref 3.5–5.0)
Anion gap: 9 (ref 5–15)
BUN: 36 mg/dL — ABNORMAL HIGH (ref 8–23)
CO2: 27 mmol/L (ref 22–32)
Calcium: 8.1 mg/dL — ABNORMAL LOW (ref 8.9–10.3)
Chloride: 99 mmol/L (ref 98–111)
Creatinine, Ser: 3 mg/dL — ABNORMAL HIGH (ref 0.61–1.24)
GFR, Estimated: 20 mL/min — ABNORMAL LOW (ref >60)
Glucose, Bld: 106 mg/dL — ABNORMAL HIGH (ref 70–99)
Phosphorus: 4.9 mg/dL — ABNORMAL HIGH (ref 2.5–4.6)
Potassium: 3.8 mmol/L (ref 3.5–5.1)
Sodium: 135 mmol/L (ref 135–145)

## 2023-01-13 LAB — IRON AND TIBC
Iron: 76 ug/dL (ref 45–182)
Saturation Ratios: 26 % (ref 17.9–39.5)
TIBC: 299 ug/dL (ref 250–450)
UIBC: 223 ug/dL

## 2023-01-13 LAB — CBC
HCT: 26.4 % — ABNORMAL LOW (ref 39.0–52.0)
Hemoglobin: 8.6 g/dL — ABNORMAL LOW (ref 13.0–17.0)
MCH: 28.3 pg (ref 26.0–34.0)
MCHC: 32.6 g/dL (ref 30.0–36.0)
MCV: 86.8 fL (ref 80.0–100.0)
Platelets: 145 10*3/uL — ABNORMAL LOW (ref 150–400)
RBC: 3.04 MIL/uL — ABNORMAL LOW (ref 4.22–5.81)
RDW: 14.4 % (ref 11.5–15.5)
WBC: 4.8 10*3/uL (ref 4.0–10.5)
nRBC: 0 % (ref 0.0–0.2)

## 2023-01-13 LAB — PROTEIN / CREATININE RATIO, URINE
Creatinine, Urine: 99 mg/dL
Protein Creatinine Ratio: 0.79 mg/mg{Cre} — ABNORMAL HIGH (ref 0.00–0.15)
Total Protein, Urine: 78 mg/dL

## 2023-01-13 LAB — FERRITIN: Ferritin: 43 ng/mL (ref 24–336)

## 2023-01-15 LAB — PTH, INTACT AND CALCIUM
Calcium, Total (PTH): 8.6 mg/dL (ref 8.6–10.2)
PTH: 117 pg/mL — ABNORMAL HIGH (ref 15–65)

## 2023-01-16 DIAGNOSIS — E211 Secondary hyperparathyroidism, not elsewhere classified: Secondary | ICD-10-CM | POA: Diagnosis not present

## 2023-01-16 DIAGNOSIS — I5033 Acute on chronic diastolic (congestive) heart failure: Secondary | ICD-10-CM | POA: Diagnosis not present

## 2023-01-16 DIAGNOSIS — E119 Type 2 diabetes mellitus without complications: Secondary | ICD-10-CM | POA: Diagnosis not present

## 2023-01-16 DIAGNOSIS — I129 Hypertensive chronic kidney disease with stage 1 through stage 4 chronic kidney disease, or unspecified chronic kidney disease: Secondary | ICD-10-CM | POA: Diagnosis not present

## 2023-01-16 DIAGNOSIS — R809 Proteinuria, unspecified: Secondary | ICD-10-CM | POA: Diagnosis not present

## 2023-01-16 DIAGNOSIS — N189 Chronic kidney disease, unspecified: Secondary | ICD-10-CM | POA: Diagnosis not present

## 2023-01-16 DIAGNOSIS — E1129 Type 2 diabetes mellitus with other diabetic kidney complication: Secondary | ICD-10-CM | POA: Diagnosis not present

## 2023-01-16 DIAGNOSIS — E1122 Type 2 diabetes mellitus with diabetic chronic kidney disease: Secondary | ICD-10-CM | POA: Diagnosis not present

## 2023-01-16 DIAGNOSIS — D631 Anemia in chronic kidney disease: Secondary | ICD-10-CM | POA: Diagnosis not present

## 2023-01-17 ENCOUNTER — Other Ambulatory Visit: Payer: Self-pay

## 2023-01-18 DIAGNOSIS — H35033 Hypertensive retinopathy, bilateral: Secondary | ICD-10-CM | POA: Diagnosis not present

## 2023-01-18 DIAGNOSIS — H52223 Regular astigmatism, bilateral: Secondary | ICD-10-CM | POA: Diagnosis not present

## 2023-01-18 DIAGNOSIS — Z961 Presence of intraocular lens: Secondary | ICD-10-CM | POA: Diagnosis not present

## 2023-01-18 DIAGNOSIS — Z7984 Long term (current) use of oral hypoglycemic drugs: Secondary | ICD-10-CM | POA: Diagnosis not present

## 2023-01-18 DIAGNOSIS — I10 Essential (primary) hypertension: Secondary | ICD-10-CM | POA: Diagnosis not present

## 2023-01-18 DIAGNOSIS — H524 Presbyopia: Secondary | ICD-10-CM | POA: Diagnosis not present

## 2023-01-18 DIAGNOSIS — E119 Type 2 diabetes mellitus without complications: Secondary | ICD-10-CM | POA: Diagnosis not present

## 2023-01-20 ENCOUNTER — Other Ambulatory Visit (HOSPITAL_COMMUNITY): Payer: Self-pay | Admitting: Nephrology

## 2023-01-20 DIAGNOSIS — E1121 Type 2 diabetes mellitus with diabetic nephropathy: Secondary | ICD-10-CM

## 2023-01-20 DIAGNOSIS — D631 Anemia in chronic kidney disease: Secondary | ICD-10-CM

## 2023-01-20 DIAGNOSIS — E211 Secondary hyperparathyroidism, not elsewhere classified: Secondary | ICD-10-CM

## 2023-01-20 DIAGNOSIS — I10 Essential (primary) hypertension: Secondary | ICD-10-CM

## 2023-01-20 DIAGNOSIS — R809 Proteinuria, unspecified: Secondary | ICD-10-CM

## 2023-01-20 DIAGNOSIS — I5033 Acute on chronic diastolic (congestive) heart failure: Secondary | ICD-10-CM

## 2023-01-23 ENCOUNTER — Encounter: Payer: Medicare HMO | Admitting: Internal Medicine

## 2023-01-23 ENCOUNTER — Encounter: Payer: Self-pay | Admitting: Internal Medicine

## 2023-01-23 VITALS — BP 182/98 | HR 71 | Ht 69.0 in | Wt 220.0 lb

## 2023-01-23 DIAGNOSIS — I77819 Aortic ectasia, unspecified site: Secondary | ICD-10-CM | POA: Diagnosis not present

## 2023-01-23 MED ORDER — BISOPROLOL FUMARATE 5 MG PO TABS: 5.0000 mg | ORAL_TABLET | Freq: Every day | ORAL | 3 refills | Status: AC

## 2023-01-23 NOTE — Progress Notes (Signed)
Cardiology Office Note  Date: 01/23/2023   ID: Andrew Adams, DOB [birth year], MRN [id number]  PCP:  Lavella Lemons, PA  Cardiologist:  Chalmers Guest, MD Electrophysiologist:  Melida Quitter, MD   Reason for Office Visit:    History of Present Illness: Andrew Adams is a 77 y.o. male known to have HTN, DM 2, HLD, CKD followed by nephrology, OSA on CPAP, paroxysmal SVT presented to cardiology clinic for follow-up visit.  Patient was admitted to [facility] in 09/2022 with acute hypoxic and hypercarbic respiratory failure from COPD exacerbation. During acute hospitalization, he was found to be in paroxysmal SVT (long RP tachycardia, asymptomatic) due to which he was initially started on metoprolol and diltiazem (after discontinuing Coreg). Cardiology was consulted who recommended to start amiodarone and outpatient EP referral.  He was placed on nasal cannula oxygen upon discharge. He was seen by EP, amiodarone was discontinued and event monitor was performed to rule out AT/SVT. He was found to have no SVT episodes but 1 episode of 14 beat nonsustained V. tach. Patient presents today for follow-up visit.  Is currently off amiodarone. No palpitations. No symptoms of chest pain, dizziness, syncope, lightheadedness. He has minimal SOB with exertion associated with lower EXTR swelling. He is currently on p.o. Lasix regimen which his nephrologist is managing.  Past Medical History:  Diagnosis Date   Anemia    Borderline diabetic    Chronic kidney disease    Diabetes mellitus without complication ([place])    Duodenal adenoma    2021   GERD (gastroesophageal reflux disease)    Gout    Gouty arthritis    H/O small bowel obstruction    History of back surgery    Hx of adenomatous colonic polyps    2021   Hyperlipidemia    Hypertension    PE (pulmonary embolism)    Sleep apnea     Past Surgical History:  Procedure Laterality Date   ABDOMINAL SURGERY  2103   [place]  bowel obstruction    ABDOMINAL SURGERY  1981   Perforated large intestine   back     Lower back   BIOPSY  08/28/2020   Procedure: BIOPSY;  Surgeon: Harvel Quale, MD;  [facility];  Service: Gastroenterology;;   BIOPSY  02/07/2022   Procedure: BIOPSY;  Surgeon: Irving Copas., MD;  Location: [facility];  Service: Gastroenterology;;   CATARACT EXTRACTION W/PHACO Right 01/06/2020   Procedure: CATARACT EXTRACTION PHACO AND INTRAOCULAR LENS PLACEMENT (IOC) (CDE: 6.48);  Surgeon: [name], MD;  Location: [facility];  Service: Ophthalmology;  Laterality: Right;   CATARACT EXTRACTION W/PHACO Left 01/20/2020   Procedure: CATARACT EXTRACTION PHACO AND INTRAOCULAR LENS PLACEMENT (IOC);  Surgeon: [name], MD;  Location: [facility];  Service: Ophthalmology;  Laterality: Left;  CDE: 5.86   COLONOSCOPY WITH PROPOFOL N/A 08/28/2020   Procedure: COLONOSCOPY WITH PROPOFOL;  Surgeon: Harvel Quale, MD;  [facility];  Service: Gastroenterology;  Laterality: N/A;  230   ENDOSCOPIC MUCOSAL RESECTION N/A 02/07/2022   Procedure: ENDOSCOPIC MUCOSAL RESECTION;  Surgeon: Rush Landmark Telford Nab., MD;  Location: [facility];  Service: Gastroenterology;  Laterality: N/A;   ESOPHAGOGASTRODUODENOSCOPY N/A 02/07/2022   Procedure: ESOPHAGOGASTRODUODENOSCOPY (EGD);  Surgeon: Irving Copas., MD;  Location: [facility];  Service: Gastroenterology;  Laterality: N/A;   ESOPHAGOGASTRODUODENOSCOPY (EGD) WITH PROPOFOL N/A 08/28/2020   Procedure: ESOPHAGOGASTRODUODENOSCOPY (EGD) WITH PROPOFOL;  Surgeon: Harvel Quale, MD;  [facility];  Service: Gastroenterology;  Laterality: N/A;   ESOPHAGOGASTRODUODENOSCOPY (EGD) WITH PROPOFOL N/A 12/14/2020   Procedure: ESOPHAGOGASTRODUODENOSCOPY (EGD) WITH PROPOFOL;  Surgeon: Rush Landmark Telford Nab., MD;  Location: [facility];  Service: Gastroenterology;  Laterality: N/A;   GIVENS CAPSULE STUDY N/A  09/10/2020   Procedure: GIVENS CAPSULE STUDY;  Surgeon: Harvel Quale, MD;  [facility];  Service: Gastroenterology;  Laterality: N/A;  [place]  12/14/2020   Procedure: HEMOSTASIS CLIP PLACEMENT;  Surgeon: Irving Copas., MD;  Location: [facility];  Service: Gastroenterology;;   HEMOSTASIS CLIP PLACEMENT  02/07/2022   Procedure: HEMOSTASIS CLIP PLACEMENT;  Surgeon: Irving Copas., MD;  Location: [facility];  Service: Gastroenterology;;   HEMOSTASIS CONTROL  12/14/2020   Procedure: HEMOSTASIS CONTROL;  Surgeon: Irving Copas., MD;  [facility]: [facility];  Service: Gastroenterology;;   HERNIA REPAIR     incisional   HOT HEMOSTASIS N/A 02/07/2022   Procedure: HOT HEMOSTASIS (ARGON PLASMA COAGULATION/BICAP);  Surgeon: Irving Copas., MD;  Location: [facility];  Service: Gastroenterology;  Laterality: N/A;   POLYPECTOMY  08/28/2020   Procedure: POLYPECTOMY;  Surgeon: Harvel Quale, MD;  [facility];  Service: Gastroenterology;;   POLYPECTOMY  12/14/2020   Procedure: POLYPECTOMY;  Surgeon: Irving Copas., MD;  Location: [facility];  Service: Gastroenterology;;   Clide Deutscher  12/14/2020   Procedure: Clide Deutscher;  Surgeon: Mansouraty, Telford Nab., MD;  Location: [facility];  Service: Gastroenterology;;   Maryagnes Amos INJECTION  02/07/2022   Procedure: SUBMUCOSAL LIFTING INJECTION;  Surgeon: Irving Copas., MD;  Location: [facility];  Service: Gastroenterology;;   SUBMUCOSAL TATTOO INJECTION  12/14/2020   Procedure: SUBMUCOSAL TATTOO INJECTION;  Surgeon: Irving Copas., MD;  Location: [facility];  Service: Gastroenterology;;    Current Outpatient Medications  Medication Sig Dispense Refill   albuterol (VENTOLIN HFA) 108 (90 Base) MCG/ACT inhaler Inhale 2 puffs into the lungs every 4 (four) hours as needed.     allopurinol (ZYLOPRIM) 100 MG tablet  Take 1 tablet (100 mg total) by mouth daily. 90 tablet 1   amLODipine (NORVASC) 5 MG tablet Take 5 mg by mouth daily.     apixaban (ELIQUIS) 5 MG TABS tablet Take 2 tablets (10 mg total) by mouth 2 (two) times daily. On 08/27/22, start 5 mg  (1 tab) two times daily 74 tablet 1   atorvastatin (LIPITOR) 40 MG tablet Take 1 tablet (40 mg total) by mouth daily. 90 tablet 1   budesonide (PULMICORT) 0.5 MG/2ML nebulizer solution Take 0.5 mg by nebulization 2 (two) times daily.     diltiazem (CARDIZEM CD) 120 MG 24 hr capsule Take 1 capsule (120 mg total) by mouth daily. 30 capsule 1   FEROSUL 325 (65 Fe) MG tablet TAKE ONE TABLET BY MOUTH EVERY MORNING WITH BREAKFAST 90 tablet 3   furosemide (LASIX) 40 MG tablet Take 1 tablet (40 mg total) by mouth daily. (Patient taking differently: Take 2 tabs (80mg ) by mouth every morning & 1 tab (40mg ) every evening) 30 tablet 1   ipratropium (ATROVENT) 0.02 % nebulizer solution Inhale into the lungs.     ipratropium-albuterol (DUONEB) 0.5-2.5 (3) MG/3ML SOLN Take 3 mLs by nebulization every 6 (six) hours. 360 mL 1   metoprolol tartrate (LOPRESSOR) 50 MG tablet Take 1 tablet (50 mg total) by mouth 2 (two) times daily. 60 tablet 1   Multiple Vitamin (MULTIVITAMIN WITH MINERALS) TABS tablet Take 1 tablet by mouth daily.  pantoprazole (PROTONIX) 40 MG tablet Take 1 tablet (40 mg total) by mouth 2 (two) times daily before a meal. (Patient taking differently: Take 40 mg by mouth daily.) 60 tablet 12   potassium chloride (KLOR-CON) 10 MEQ tablet Take 10 mEq by mouth daily.     TRULICITY [id number] SOPN Inject 0.75 mg into the skin every Friday.     epoetin alfa-epbx (RETACRIT) 3000 UNIT/ML injection Inject 3,000 Units into the vein every 14 (fourteen) days.  (Patient not taking: Reported on 01/23/2023)     hydrALAZINE (APRESOLINE) 25 MG tablet Take 1 tablet (25 mg total) by mouth every 6 (six) hours. 90 tablet 1   predniSONE (DELTASONE) 20 MG tablet Take 1 tablet (20  mg total) by mouth daily with breakfast. 3 tablet 0   No current facility-administered medications for this visit.   Allergies:  Patient has no known allergies.   Social History: The patient  reports that he quit smoking about 27 years ago. His smoking use included cigarettes. He has a 38.00 pack-year smoking history. He has never used smokeless tobacco. He reports current alcohol use of about 2.0 standard drinks of alcohol per week. He reports current drug use. Drug: Marijuana.   Family History: The patient's family history includes Diabetes in his mother, sister, and sister; Hypertension in his father, mother, and sister; Ulcers in his father.   ROS:  Please see the history of present illness. Otherwise, complete review of systems is positive for none.  All other systems are reviewed and negative.   Physical Exam: VS:  BP (!) 182/98 (BP Location: Left Arm, Cuff Size: Normal)   Pulse 71   Ht 5\' 9"  (1.753 m)   Wt 220 lb (99.8 kg)   SpO2 91%   BMI 32.49 kg/m , BMI Body mass index is 32.49 kg/m.  Wt Readings from Last 3 Encounters:  01/23/23 220 lb (99.8 kg)  01/02/23 223 lb 9.6 oz (101.4 kg)  11/28/22 214 lb 6.4 oz (97.3 kg)    General: Patient appears comfortable at rest. HEENT: Conjunctiva and lids normal, oropharynx clear with moist mucosa. Neck: Supple, no elevated JVP or carotid bruits, no thyromegaly. Lungs: Clear to auscultation, nonlabored breathing at rest. Cardiac: Regular rate and rhythm, no S3 or significant systolic murmur, no pericardial rub. Abdomen: Soft, nontender, no hepatomegaly, bowel sounds present, no guarding or rebound. Extremities: No pitting edema, distal pulses 2+. Skin: Warm and dry. Musculoskeletal: No kyphosis. Neuropsychiatric: Alert and oriented x3, affect grossly appropriate.  ECG:  NSR  Recent Labwork: 10/20/2022: ALT 12; AST 19 10/27/2022: B Natriuretic Peptide 259.0 10/29/2022: Magnesium 2.1; TSH 1.462 01/13/2023: BUN 36; Creatinine, Ser  3.00; Hemoglobin 8.6; Platelets 145; Potassium 3.8; Sodium 135     Component Value Date/Time   CHOL 150 02/24/2014 1030   CHOL 108 05/07/2013 0942   TRIG 130 02/24/2014 1030   TRIG 76 05/07/2013 0942   HDL 45 02/24/2014 1030   HDL 38 (L) 05/07/2013 0942   LDLCALC 79 02/24/2014 1030   LDLCALC 55 05/07/2013 0942    Other Studies Reviewed Today: Echocardiogram in 07/2022 LVEF normal Moderate LVH G2 DD Mild dilatation of the ascending aorta, 38 mm  Long-term event monitor in 10/2022 Sinus rhythm HR 64-104, avg 80 No clinically significant arrhythmia other than one 12 beat run of VT. No patient-triggered events.  Assessment and Plan: Patient is a 77 year old M known to have HTN, DM 2, HLD, CKD followed by nephrology, OSA on CPAP, paroxysmal SVT presented to cardiology  clinic for follow-up visit.  # Paroxysmal SVT -Paroxysmal SVT/long RP tachycardia likely atrial tachycardia occurred in the setting of acute systemic illness. He did not have any recurrent episodes of SVT off amiodarone.  He is currently on metoprolol and diltiazem in addition to amlodipine. Will discontinue diltiazem and switch metoprolol to bisoprolol 5 mg once daily.  # HTN, partially controlled # [place] HTN -Continue p.o. Lasix regimen, management nephrology -Switch metoprolol to bisoprolol 5 mg once daily -Continue amlodipine 5 mg once daily -Continue hydralazine 50 mg three times daily -HTN management per PCP and nephrology  # OSA on CPAP # COPD -Follow-up with sleep medicine (instructed patient to make appointment with the lung doctor for OSA and COPD management).  # Chronic diastolic heart failure -Continue p.o. Lasix regimen 80 mg in a.m. and 40 mg in the p.m.  # History of PE -Diagnosed in 07/2022 by NM perfusion scan. Continue Eliquis 5 mg twice daily.  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  > 50% of time was spent in direct patient care.      Medication Adjustments/Labs and Tests Ordered: Current medicines are reviewed at length with the patient today.  Concerns regarding medicines are outlined above.   Tests Ordered: No orders of the defined types were placed in this encounter.   Medication Changes: No orders of the defined types were placed in this encounter.   Disposition:  Follow up  1 year  Signed Kellye Mizner Fidel Levy, MD, 01/23/2023 11:11 AM    [facility] [place] [place], [place], [place] [id number]

## 2023-01-23 NOTE — Patient Instructions (Addendum)
Medication Instructions:  Your physician has recommended you make the following change in your medication:  Stop diltiazem Stop metoprolol tartrate Start bisoprolol 5 mg daily Continue other medications the same  Labwork: none  Testing/Procedures: none  Follow-Up: Your physician recommends that you schedule a follow-up appointment in: 1 year. You will receive a reminder call in the mail in about 10 months reminding you to call and schedule your appointment. If you don't receive this call, please contact our office.  Any Other Special Instructions Will Be Listed Below (If Applicable).  If you need a refill on your cardiac medications before your next appointment, please call your pharmacy.

## 2023-01-24 ENCOUNTER — Encounter (HOSPITAL_COMMUNITY)
Admission: RE | Admit: 2023-01-24 | Discharge: 2023-01-24 | Disposition: A | Discharge status: Home / Self Care | Payer: Medicare HMO | Source: Ambulatory Visit | Attending: Nephrology | Admitting: Nephrology

## 2023-01-24 VITALS — BP 150/78 | HR 82 | Temp 98.5°F | Resp 12

## 2023-01-24 DIAGNOSIS — J449 Chronic obstructive pulmonary disease, unspecified: Secondary | ICD-10-CM | POA: Diagnosis not present

## 2023-01-24 DIAGNOSIS — E1122 Type 2 diabetes mellitus with diabetic chronic kidney disease: Secondary | ICD-10-CM | POA: Diagnosis not present

## 2023-01-24 DIAGNOSIS — E785 Hyperlipidemia, unspecified: Secondary | ICD-10-CM | POA: Diagnosis not present

## 2023-01-24 DIAGNOSIS — I130 Hypertensive heart and chronic kidney disease with heart failure and stage 1 through stage 4 chronic kidney disease, or unspecified chronic kidney disease: Secondary | ICD-10-CM | POA: Diagnosis not present

## 2023-01-24 DIAGNOSIS — I77819 Aortic ectasia, unspecified site: Secondary | ICD-10-CM | POA: Diagnosis not present

## 2023-01-24 DIAGNOSIS — I4719 Other supraventricular tachycardia: Secondary | ICD-10-CM | POA: Diagnosis not present

## 2023-01-24 DIAGNOSIS — I5032 Chronic diastolic (congestive) heart failure: Secondary | ICD-10-CM | POA: Diagnosis not present

## 2023-01-24 DIAGNOSIS — Z86711 Personal history of pulmonary embolism: Secondary | ICD-10-CM | POA: Diagnosis not present

## 2023-01-24 DIAGNOSIS — Z79899 Other long term (current) drug therapy: Secondary | ICD-10-CM | POA: Diagnosis not present

## 2023-01-24 DIAGNOSIS — Z862 Personal history of diseases of the blood and blood-forming organs and certain disorders involving the immune mechanism: Secondary | ICD-10-CM

## 2023-01-24 DIAGNOSIS — G4733 Obstructive sleep apnea (adult) (pediatric): Secondary | ICD-10-CM | POA: Diagnosis not present

## 2023-01-24 DIAGNOSIS — Z8249 Family history of ischemic heart disease and other diseases of the circulatory system: Secondary | ICD-10-CM | POA: Diagnosis not present

## 2023-01-24 DIAGNOSIS — D631 Anemia in chronic kidney disease: Secondary | ICD-10-CM | POA: Diagnosis not present

## 2023-01-24 DIAGNOSIS — Z833 Family history of diabetes mellitus: Secondary | ICD-10-CM | POA: Diagnosis not present

## 2023-01-24 DIAGNOSIS — N189 Chronic kidney disease, unspecified: Secondary | ICD-10-CM | POA: Diagnosis not present

## 2023-01-24 DIAGNOSIS — Z7901 Long term (current) use of anticoagulants: Secondary | ICD-10-CM | POA: Diagnosis not present

## 2023-01-24 LAB — POCT HEMOGLOBIN-HEMACUE: Hemoglobin: 9.1 g/dL — ABNORMAL LOW (ref 13.0–17.0)

## 2023-01-24 MED ORDER — EPOETIN ALFA-EPBX 10000 UNIT/ML IJ SOLN
6000.0000 [IU] | Freq: Once | INTRAMUSCULAR | Status: AC
  Administered 2023-01-24: 6000 [IU] via SUBCUTANEOUS
  Filled 2023-01-24: qty 1

## 2023-01-24 NOTE — Progress Notes (Signed)
Diagnosis: Anemia in Chronic Kidney Disease  Provider:  Manpreet Bhutani MD  Procedure: Injection  Retacrit (epoetin alfa-epbx), Dose: 6000 units , Site: subcutaneous, Number of injections: 1  Hgb 9.1. Administered in left arm.  Post Care: Patient declined observation  Discharge: Condition: Good, Destination: Home . AVS Provided  Performed by:  Binnie Kand, RN

## 2023-01-29 DIAGNOSIS — I10 Essential (primary) hypertension: Secondary | ICD-10-CM | POA: Diagnosis not present

## 2023-01-29 DIAGNOSIS — J449 Chronic obstructive pulmonary disease, unspecified: Secondary | ICD-10-CM | POA: Diagnosis not present

## 2023-01-29 DIAGNOSIS — K219 Gastro-esophageal reflux disease without esophagitis: Secondary | ICD-10-CM | POA: Diagnosis not present

## 2023-01-29 DIAGNOSIS — E782 Mixed hyperlipidemia: Secondary | ICD-10-CM | POA: Diagnosis not present

## 2023-01-30 ENCOUNTER — Ambulatory Visit (HOSPITAL_COMMUNITY): Admission: RE | Admit: 2023-01-30 | Payer: Medicare HMO | Source: Ambulatory Visit

## 2023-01-30 ENCOUNTER — Other Ambulatory Visit (HOSPITAL_COMMUNITY): Payer: Self-pay | Admitting: Nephrology

## 2023-01-30 DIAGNOSIS — I10 Essential (primary) hypertension: Secondary | ICD-10-CM

## 2023-01-30 NOTE — Progress Notes (Signed)
[facility] [address], [place] [address]   Clinic Day:  01/31/2023  Referring physician: Liana Gerold, MD  Patient Care Team: Lavella Lemons, PA as PCP - General Mealor, Yetta Barre, MD as PCP - Electrophysiology (Cardiology) Mallipeddi, Quenten Raven, MD as PCP - Cardiology (Cardiology) Liana Gerold, MD as Consulting Physician (Nephrology) Derek Jack, MD as Medical Oncologist (Hematology)   ASSESSMENT & PLAN:   Assessment:  1.  Normocytic anemia: - Patient seen at the request of Dr. Theador Hawthorne - On iron supplement daily since 2021.  No prior history of blood transfusion. - Colonoscopy (08/28/2020): Nonbleeding internal hemorrhoids, diverticulosis. - EGD (02/07/2022): Multiple gastric polyps. - Denies any bleeding per rectum or melena. - Retacrit 3000 units on 12/05/2022, 01/02/2023, increased to 6000 units on 01/24/2023  2.  Social/family history: - Lives with wife at home.  Independent of ADLs and IADLs.  Has been on O2 via nasal cannula since December 2023.  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  Quit smoking 25 years ago. - No family history of severe anemia.  Sister has cancer but type unknown to the patient.  Plan:  1.  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 - Will check for coexisting nutritional deficiencies with [id number], folic acid, MMA, copper.  Will also check for SPEP and free light chains. - Recommend parenteral iron therapy with Venofer 500 mg x 2 doses. - He is receiving Retacrit 6000 units every 2 weeks, starting 01/24/2023. - Recommend follow-up in 6 to 8 weeks with repeat CBC, ferritin and iron panel.   Orders Placed This Encounter  Procedures   Lactate dehydrogenase    Standing Status:   Future    Number of Occurrences:   1    Standing Expiration Date:   01/31/2024    Order Specific Question:   Release to patient    Answer:   Immediate   Reticulocytes     Standing Status:   Future    Number of Occurrences:   1    Standing Expiration Date:   01/31/2024   Vitamin B12    Standing Status:   Future    Number of Occurrences:   1    Standing Expiration Date:   01/31/2024    Order Specific Question:   Release to patient    Answer:   Immediate   Folate    Standing Status:   Future    Number of Occurrences:   1    Standing Expiration Date:   01/31/2024    Order Specific Question:   Release to patient    Answer:   Immediate   Methylmalonic acid, serum    Standing Status:   Future    Number of Occurrences:   1    Standing Expiration Date:   01/31/2024   Copper, serum    Standing Status:   Future    Number of Occurrences:   1    Standing Expiration Date:   01/31/2024   Protein electrophoresis, serum    Standing Status:   Future    Number of Occurrences:   1    Standing Expiration Date:   01/31/2024    Order Specific Question:   Release to patient    Answer:   Immediate   Kappa/lambda light chains    Standing Status:   Future    Number of Occurrences:   1    Standing Expiration Date:  01/31/2024    Order Specific Question:   Release to patient    Answer:   Immediate   CBC with Differential/Platelet    Standing Status:   Future    Standing Expiration Date:   01/31/2024    Order Specific Question:   Release to patient    Answer:   Immediate   Ferritin    Standing Status:   Future    Standing Expiration Date:   01/31/2024    Order Specific Question:   Release to patient    Answer:   Immediate   Iron and TIBC    Standing Status:   Future    Standing Expiration Date:   01/31/2024    Order Specific Question:   Release to patient    Answer:   Immediate      I,Katie Daubenspeck,acting as a scribe for Derek Jack, MD.,have documented all relevant documentation on the behalf of Derek Jack, MD,as directed by  Derek Jack, MD while in the presence of Derek Jack, MD.   I, Derek Jack MD, have reviewed the above  documentation for accuracy and completeness, and I agree with the above.   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 male presenting to clinic today for evaluation of anemia at the request of Dr. Theador Hawthorne.  Today, he states that he is doing well overall. His appetite level is at 80%. His energy level is at 50%.  He has a longstanding history of CKD, followed by nephrology. He was started on Epogen (retacrit) injections on 04/09/20, most recent injection on 01/24/23. He was also started on oral iron in 04/2020. He also previously received IV Feraheme 06/26/20 and 07/03/20. He is followed closely by GI due to h/o polyps in duodenum. He denies recent chest pain on exertion, shortness of breath on minimal exertion, pre-syncopal episodes, or palpitations. He had not noticed any recent bleeding such as epistaxis, hematuria or hematochezia The patient denies over the counter NSAID ingestion. He is not on antiplatelets agents. He had no prior history or diagnosis of cancer.  His age appropriate screening programs are up-to-date. He denies any pica and eats a variety of diet. He never donated blood or received blood transfusion.   PAST MEDICAL HISTORY:   Past Medical History: Past Medical History:  Diagnosis Date   Anemia    Borderline diabetic    Chronic kidney disease    Diabetes mellitus without complication ([place])    Duodenal adenoma    2021   GERD (gastroesophageal reflux disease)    Gout    Gouty arthritis    H/O small bowel obstruction    History of back surgery    Hx of adenomatous colonic polyps    2021   Hyperlipidemia    Hypertension    PE (pulmonary embolism)    Sleep apnea     Surgical History: Past Surgical History:  Procedure Laterality Date   ABDOMINAL SURGERY  2103   Smal bowel obstruction    ABDOMINAL  SURGERY  1981   Perforated large intestine   back     Lower back   BIOPSY  08/28/2020   Procedure: BIOPSY;  Surgeon: Harvel Quale, MD;  [facility];  Service: Gastroenterology;;   BIOPSY  02/07/2022   Procedure: BIOPSY;  Surgeon: Irving Copas., MD;  Location: [facility];  Service: Gastroenterology;;   CATARACT EXTRACTION W/PHACO Right 01/06/2020   Procedure: CATARACT EXTRACTION PHACO AND INTRAOCULAR LENS PLACEMENT (IOC) (CDE: 6.48);  Surgeon: [name], MD;  Location: [facility];  Service: Ophthalmology;  Laterality: Right;   CATARACT EXTRACTION W/PHACO Left 01/20/2020   Procedure: CATARACT EXTRACTION PHACO AND INTRAOCULAR LENS PLACEMENT (IOC);  Surgeon: [name], MD;  Location: [facility];  Service: Ophthalmology;  Laterality: Left;  CDE: 5.86   COLONOSCOPY WITH PROPOFOL N/A 08/28/2020   Procedure: COLONOSCOPY WITH PROPOFOL;  Surgeon: Harvel Quale, MD;  [facility];  Service: Gastroenterology;  Laterality: N/A;  230   ENDOSCOPIC MUCOSAL RESECTION N/A 02/07/2022   Procedure: ENDOSCOPIC MUCOSAL RESECTION;  Surgeon: Rush Landmark Telford Nab., MD;  [facility];  Service: Gastroenterology;  Laterality: N/A;   ESOPHAGOGASTRODUODENOSCOPY N/A 02/07/2022   Procedure: ESOPHAGOGASTRODUODENOSCOPY (EGD);  Surgeon: Irving Copas., MD;  Location: [facility];  Service: Gastroenterology;  Laterality: N/A;   ESOPHAGOGASTRODUODENOSCOPY (EGD) WITH PROPOFOL N/A 08/28/2020   Procedure: ESOPHAGOGASTRODUODENOSCOPY (EGD) WITH PROPOFOL;  Surgeon: Harvel Quale, MD;  [facility];  Service: Gastroenterology;  Laterality: N/A;   ESOPHAGOGASTRODUODENOSCOPY (EGD) WITH PROPOFOL N/A 12/14/2020   Procedure: ESOPHAGOGASTRODUODENOSCOPY (EGD) WITH PROPOFOL;  Surgeon: Rush Landmark Telford Nab., MD;  [facility];  Service: Gastroenterology;  Laterality: N/A;   GIVENS CAPSULE STUDY N/A 09/10/2020   Procedure: GIVENS  CAPSULE STUDY;  Surgeon: Harvel Quale, MD;  [facility];  Service: Gastroenterology;  Laterality: N/A;  [place]  12/14/2020   Procedure: HEMOSTASIS CLIP PLACEMENT;  Surgeon: Irving Copas., MD;  Location: [facility];  Service: Gastroenterology;;   HEMOSTASIS CLIP PLACEMENT  02/07/2022   Procedure: HEMOSTASIS CLIP PLACEMENT;  Surgeon: Irving Copas., MD;  Location: [facility];  Service: Gastroenterology;;   HEMOSTASIS CONTROL  12/14/2020   Procedure: HEMOSTASIS CONTROL;  Surgeon: Irving Copas., MD;  [facility];  Service: Gastroenterology;;   HERNIA REPAIR     incisional   HOT HEMOSTASIS N/A 02/07/2022   Procedure: HOT HEMOSTASIS (ARGON PLASMA COAGULATION/BICAP);  Surgeon: Irving Copas., MD;  Location: [facility];  Service: Gastroenterology;  Laterality: N/A;   POLYPECTOMY  08/28/2020   Procedure: POLYPECTOMY;  Surgeon: Harvel Quale, MD;  [facility];  Service: Gastroenterology;;   POLYPECTOMY  12/14/2020   Procedure: POLYPECTOMY;  Surgeon: Irving Copas., MD;  Location: [facility];  Service: Gastroenterology;;   Clide Deutscher  12/14/2020   Procedure: Clide Deutscher;  Surgeon: Mansouraty, Telford Nab., MD;  Location: [facility];  Service: Gastroenterology;;   Maryagnes Amos INJECTION  02/07/2022   Procedure: SUBMUCOSAL LIFTING INJECTION;  Surgeon: Irving Copas., MD;  Location: [facility];  Service: Gastroenterology;;   SUBMUCOSAL TATTOO INJECTION  12/14/2020   Procedure: SUBMUCOSAL TATTOO INJECTION;  Surgeon: Irving Copas., MD;  [facility];  Service: Gastroenterology;;    Social History: Social History   Socioeconomic History   Marital status: Married    Spouse name: Not on file   Number of children: 1   Years of education: Not on file   Highest education level: Not on file  Occupational History   Occupation: Finisher   Tobacco Use   Smoking status: Former    Packs/day: 1.00    Years: 38.00    Additional pack years: 0.00    Total pack years: 38.00    Types: Cigarettes    Quit date: 02/07/1995    Years since quitting: 28.0   Smokeless tobacco:  Never  Vaping Use   Vaping Use: Never used  Substance and Sexual Activity   Alcohol use: Yes    Alcohol/week: 2.0 standard drinks of alcohol    Types: 2 Shots of liquor per week    Comment: occasional    Drug use: Yes    Types: Marijuana   Sexual activity: Yes  Other Topics Concern   Not on file  Social History Narrative   Lives with wife.     Social Determinants of Health   Financial Resource Strain: Not on file  Food Insecurity: No Food Insecurity (01/31/2023)   Hunger Vital Sign    Worried About Running Out of Food in the Last Year: Never true    Ran Out of Food in the Last Year: Never true  Transportation Needs: No Transportation Needs (01/31/2023)   PRAPARE - Hydrologist (Medical): No    Lack of Transportation (Non-Medical): No  Physical Activity: Not on file  Stress: Not on file  Social Connections: Not on file  Intimate Partner Violence: Not At Risk (01/31/2023)   Humiliation, Afraid, Rape, and Kick questionnaire    Fear of Current or Ex-Partner: No    Emotionally Abused: No    Physically Abused: No    Sexually Abused: No    Family History: Family History  Problem Relation Age of Onset   Hypertension Sister    Diabetes Sister    Diabetes Sister    Hypertension Mother    Diabetes Mother    Hypertension Father    Ulcers Father    Colon cancer Neg Hx    Esophageal cancer Neg Hx    Stomach cancer Neg Hx    Inflammatory bowel disease Neg Hx    Liver disease Neg Hx    Pancreatic cancer Neg Hx    Rectal cancer Neg Hx     Current Medications:  Current Outpatient Medications:    albuterol (VENTOLIN HFA) 108 (90 Base) MCG/ACT inhaler, Inhale 2 puffs into the lungs every 4 (four) hours as needed., Disp: ,  Rfl:    allopurinol (ZYLOPRIM) 100 MG tablet, Take 1 tablet (100 mg total) by mouth daily., Disp: 90 tablet, Rfl: 1   amLODipine (NORVASC) 5 MG tablet, Take 5 mg by mouth daily., Disp: , Rfl:    apixaban (ELIQUIS) 5 MG TABS tablet, Take 2 tablets (10 mg total) by mouth 2 (two) times daily. On 08/27/22, start 5 mg  (1 tab) two times daily, Disp: 74 tablet, Rfl: 1   atorvastatin (LIPITOR) 40 MG tablet, Take 1 tablet (40 mg total) by mouth daily., Disp: 90 tablet, Rfl: 1   bisoprolol (ZEBETA) 5 MG tablet, Take 1 tablet (5 mg total) by mouth daily., Disp: 90 tablet, Rfl: 3   budesonide (PULMICORT) 0.5 MG/2ML nebulizer solution, Take 0.5 mg by nebulization 2 (two) times daily., Disp: , Rfl:    [START ON 02/02/2023] epoetin alfa (EPOGEN) 3000 UNIT/ML injection, Inject into the skin., Disp: , Rfl:    epoetin alfa-epbx (RETACRIT) 3000 UNIT/ML injection, Inject 3,000 Units into the vein every 14 (fourteen) days., Disp: , Rfl:    FEROSUL 325 (65 Fe) MG tablet, TAKE ONE TABLET BY MOUTH EVERY MORNING WITH BREAKFAST, Disp: 90 tablet, Rfl: 3   furosemide (LASIX) 40 MG tablet, Take 1 tablet (40 mg total) by mouth daily. (Patient taking differently: Take 2 tabs (80mg ) by mouth every morning & 1 tab (40mg ) every evening), Disp: 30 tablet, Rfl: 1   hydrALAZINE (APRESOLINE) 25  MG tablet, Take 1 tablet (25 mg total) by mouth every 6 (six) hours., Disp: 90 tablet, Rfl: 1   ipratropium (ATROVENT) 0.02 % nebulizer solution, Inhale into the lungs., Disp: , Rfl:    ipratropium-albuterol (DUONEB) 0.5-2.5 (3) MG/3ML SOLN, Take 3 mLs by nebulization every 6 (six) hours., Disp: 360 mL, Rfl: 1   Multiple Vitamin (MULTIVITAMIN WITH MINERALS) TABS tablet, Take 1 tablet by mouth daily., Disp:  , Rfl:    pantoprazole (PROTONIX) 40 MG tablet, Take 1 tablet (40 mg total) by mouth 2 (two) times daily before a meal. (Patient taking differently: Take 40 mg by mouth daily.), Disp: 60 tablet, Rfl: 12   potassium chloride (KLOR-CON) 10 MEQ  tablet, Take 10 mEq by mouth daily., Disp: , Rfl:    predniSONE (DELTASONE) 20 MG tablet, Take 1 tablet (20 mg total) by mouth daily with breakfast., Disp: 3 tablet, Rfl: 0   TRULICITY [id number] SOPN, Inject 0.75 mg into the skin every Friday., Disp: , Rfl:    Allergies: No Known Allergies  REVIEW OF SYSTEMS:   Review of Systems  Constitutional:  Negative for chills, fatigue and fever.  HENT:   Negative for lump/mass, mouth sores, nosebleeds, sore throat and trouble swallowing.   Eyes:  Negative for eye problems.  Respiratory:  Positive for shortness of breath. Negative for cough.   Cardiovascular:  Negative for chest pain, leg swelling and palpitations.  Gastrointestinal:  Negative for abdominal pain, constipation, diarrhea, nausea and vomiting.  Genitourinary:  Negative for bladder incontinence, difficulty urinating, dysuria, frequency, hematuria and nocturia.   Musculoskeletal:  Negative for arthralgias, back pain, flank pain, myalgias and neck pain.  Skin:  Negative for itching and rash.  Neurological:  Negative for dizziness, headaches and numbness.  Hematological:  Does not bruise/bleed easily.  Psychiatric/Behavioral:  Negative for depression, sleep disturbance and suicidal ideas. The patient is not nervous/anxious.   All other systems reviewed and are negative.    VITALS:   Blood pressure (!) 158/97, pulse 73, temperature 98.5 F (36.9 C), temperature source Oral, resp. rate 19, height 5\' 9"  (1.753 m), weight 219 lb 5.7 oz (99.5 kg), SpO2 97 %.  Wt Readings from Last 3 Encounters:  01/31/23 219 lb 5.7 oz (99.5 kg)  01/23/23 220 lb (99.8 kg)  01/02/23 223 lb 9.6 oz (101.4 kg)    Body mass index is 32.39 kg/m.   PHYSICAL EXAM:   Physical Exam Vitals and nursing note reviewed. Exam conducted with a chaperone present.  Constitutional:      Appearance: Normal appearance.  Cardiovascular:     Rate and Rhythm: Normal rate and regular rhythm.     Pulses: Normal  pulses.     Heart sounds: Normal heart sounds.  Pulmonary:     Effort: Pulmonary effort is normal.     Breath sounds: Normal breath sounds.  Abdominal:     Palpations: Abdomen is soft. There is no hepatomegaly, splenomegaly or mass.     Tenderness: There is no abdominal tenderness.  Musculoskeletal:     Right lower leg: Edema present.     Left lower leg: Edema present.  Lymphadenopathy:     Cervical: No cervical adenopathy.     Right cervical: No superficial, deep or posterior cervical adenopathy.    Left cervical: No superficial, deep or posterior cervical adenopathy.     Upper Body:     Right upper body: No supraclavicular or axillary adenopathy.     Left upper body: No supraclavicular or axillary  adenopathy.  Neurological:     General: No focal deficit present.     Mental Status: He is alert and oriented to person, place, and time.  Psychiatric:        Mood and Affect: Mood normal.        Behavior: Behavior normal.     LABS:      Latest Ref Rng & Units 01/24/2023    3:13 PM 01/13/2023    1:28 PM 01/03/2023    3:05 PM  CBC  WBC 4.0 - 10.5 K/uL  4.8    Hemoglobin 13.0 - 17.0 g/dL 9.1  8.6  8.8   Hematocrit 39.0 - 52.0 %  26.4    Platelets 150 - 400 K/uL  145        Latest Ref Rng & Units 01/13/2023    1:28 PM 11/17/2022    3:10 PM 10/30/2022    4:29 AM  CMP  Glucose 70 - 99 mg/dL 106  108  150   BUN 8 - 23 mg/dL 36  19  74   Creatinine 0.61 - 1.24 mg/dL 3.00  2.24  2.43   Sodium 135 - 145 mmol/L 135  132  141   Potassium 3.5 - 5.1 mmol/L 3.8  3.7  4.3   Chloride 98 - 111 mmol/L 99  92  107   CO2 22 - 32 mmol/L 27  29  28    Calcium 8.9 - 10.3 mg/dL 8.6 - 10.2 mg/dL 8.1    8.6  8.1    8.2  8.4      No results found for: "CEA1", "CEA" / No results found for: "CEA1", "CEA" No results found for: "PSA1" No results found for: "[id number]" No results found for: "CAN125"  No results found for: "TOTALPROTELP", "ALBUMINELP", "A1GS", "A2GS", "BETS", "BETA2SER", "GAMS",  "MSPIKE", "SPEI" Lab Results  Component Value Date   TIBC 299 01/13/2023   TIBC 256 11/17/2022   TIBC 338 10/05/2022   FERRITIN 43 01/13/2023   FERRITIN 172 11/17/2022   FERRITIN 58 10/05/2022   IRONPCTSAT 26 01/13/2023   IRONPCTSAT 27 11/17/2022   IRONPCTSAT 14 (L) 10/05/2022   No results found for: "LDH"   STUDIES:   No results found.

## 2023-01-31 ENCOUNTER — Encounter (HOSPITAL_COMMUNITY): Admission: RE | Admit: 2023-01-31 | Payer: Medicare HMO | Source: Ambulatory Visit

## 2023-01-31 ENCOUNTER — Inpatient Hospital Stay: Payer: Medicare HMO | Attending: Hematology | Admitting: Hematology

## 2023-01-31 ENCOUNTER — Ambulatory Visit (HOSPITAL_COMMUNITY)
Admission: RE | Admit: 2023-01-31 | Discharge: 2023-01-31 | Disposition: A | Discharge status: Home / Self Care | Payer: Medicare HMO | Source: Ambulatory Visit | Attending: Pulmonary Disease | Admitting: Pulmonary Disease

## 2023-01-31 VITALS — BP 158/97 | HR 73 | Temp 98.5°F | Resp 19 | Ht 69.0 in | Wt 219.4 lb

## 2023-01-31 DIAGNOSIS — Z86711 Personal history of pulmonary embolism: Secondary | ICD-10-CM | POA: Diagnosis not present

## 2023-01-31 DIAGNOSIS — N189 Chronic kidney disease, unspecified: Secondary | ICD-10-CM | POA: Insufficient documentation

## 2023-01-31 DIAGNOSIS — R911 Solitary pulmonary nodule: Secondary | ICD-10-CM | POA: Diagnosis not present

## 2023-01-31 DIAGNOSIS — Z7901 Long term (current) use of anticoagulants: Secondary | ICD-10-CM | POA: Diagnosis not present

## 2023-01-31 DIAGNOSIS — Z79899 Other long term (current) drug therapy: Secondary | ICD-10-CM | POA: Insufficient documentation

## 2023-01-31 DIAGNOSIS — E785 Hyperlipidemia, unspecified: Secondary | ICD-10-CM | POA: Insufficient documentation

## 2023-01-31 DIAGNOSIS — Z87891 Personal history of nicotine dependence: Secondary | ICD-10-CM | POA: Diagnosis not present

## 2023-01-31 DIAGNOSIS — Z8711 Personal history of peptic ulcer disease: Secondary | ICD-10-CM | POA: Insufficient documentation

## 2023-01-31 DIAGNOSIS — Z7952 Long term (current) use of systemic steroids: Secondary | ICD-10-CM | POA: Insufficient documentation

## 2023-01-31 DIAGNOSIS — K219 Gastro-esophageal reflux disease without esophagitis: Secondary | ICD-10-CM | POA: Insufficient documentation

## 2023-01-31 DIAGNOSIS — G4730 Sleep apnea, unspecified: Secondary | ICD-10-CM | POA: Diagnosis not present

## 2023-01-31 DIAGNOSIS — Z809 Family history of malignant neoplasm, unspecified: Secondary | ICD-10-CM | POA: Diagnosis not present

## 2023-01-31 DIAGNOSIS — E1122 Type 2 diabetes mellitus with diabetic chronic kidney disease: Secondary | ICD-10-CM | POA: Insufficient documentation

## 2023-01-31 DIAGNOSIS — I129 Hypertensive chronic kidney disease with stage 1 through stage 4 chronic kidney disease, or unspecified chronic kidney disease: Secondary | ICD-10-CM | POA: Diagnosis not present

## 2023-01-31 DIAGNOSIS — D631 Anemia in chronic kidney disease: Secondary | ICD-10-CM | POA: Insufficient documentation

## 2023-01-31 DIAGNOSIS — D649 Anemia, unspecified: Secondary | ICD-10-CM | POA: Diagnosis not present

## 2023-01-31 DIAGNOSIS — D509 Iron deficiency anemia, unspecified: Secondary | ICD-10-CM | POA: Diagnosis not present

## 2023-01-31 DIAGNOSIS — R918 Other nonspecific abnormal finding of lung field: Secondary | ICD-10-CM | POA: Diagnosis not present

## 2023-01-31 DIAGNOSIS — K317 Polyp of stomach and duodenum: Secondary | ICD-10-CM | POA: Diagnosis not present

## 2023-01-31 LAB — LACTATE DEHYDROGENASE: LDH: 168 U/L (ref 98–192)

## 2023-01-31 LAB — RETICULOCYTES
Immature Retic Fract: 24.1 % — ABNORMAL HIGH (ref 2.3–15.9)
RBC.: 3.28 MIL/uL — ABNORMAL LOW (ref 4.22–5.81)
Retic Count, Absolute: 79.5 10*3/uL (ref 19.0–186.0)
Retic Ct Pct: 2.4 % (ref 0.4–3.1)

## 2023-01-31 LAB — VITAMIN B12: Vitamin B-12: 1149 pg/mL — ABNORMAL HIGH (ref 180–914)

## 2023-01-31 LAB — FOLATE: Folate: 17.3 ng/mL (ref >5.9)

## 2023-01-31 NOTE — Patient Instructions (Addendum)
[facility]  Discharge Instructions  You were seen and examined today by Dr. Delton Coombes. Dr. Delton Coombes is a hematologist, meaning that he specializes in blood abnormalities. Dr. Delton Coombes discussed your past medical history, family history of cancers/blood conditions and the events that led to you being here today.  You were referred to Dr. Delton Coombes due to anemia. You have been anemic since 2014.  Dr. Delton Coombes has recommended additional labs today for further evaluation.  Dr. Delton Coombes is ordering IV iron to be given here in the cancer center.  Follow-up as scheduled.  Thank you for choosing [facility] to provide your oncology and hematology care.   To afford each patient quality time with our provider, please arrive at least 15 minutes before your scheduled appointment time. You may need to reschedule your appointment if you arrive late (10 or more minutes). Arriving late affects you and other patients whose appointments are after yours.  Also, if you miss three or more appointments without notifying the office, you may be dismissed from the clinic at the provider's discretion.    Again, thank you for choosing [facility].  Our hope is that these requests will decrease the amount of time that you wait before being seen by our physicians.   If you have a lab appointment with the [facility] - please note that after April 8th, all labs will be drawn in the cancer center.  You do not have to check in or register with the main entrance as you have in the past but will complete your check-in at the cancer center.            _____________________________________________________________  Should you have questions after your visit to [facility], please contact our office at [phone] and follow the prompts.  Our office hours are 8:00 a.m. to 4:30 p.m. Monday - Thursday and 8:00 a.m. to 2:30 p.m. Friday.   Please note that voicemails left after 4:00 p.m. may not be returned until the following business day.  We are closed weekends and all major holidays.  You do have access to a nurse 24-7, just call the main number to the clinic [phone] and do not press any options, hold on the line and a nurse will answer the phone.    For prescription refill requests, have your pharmacy contact our office and allow 72 hours.    Masks are no longer required in the cancer centers. If you would like for your care team to wear a mask while they are taking care of you, please let them know. You may have one support person who is at least 79 years old accompany you for your appointments.

## 2023-02-01 LAB — KAPPA/LAMBDA LIGHT CHAINS
Kappa free light chain: 80.7 mg/L — ABNORMAL HIGH (ref 3.3–19.4)
Kappa, lambda light chain ratio: 1.4 (ref 0.26–1.65)
Lambda free light chains: 57.7 mg/L — ABNORMAL HIGH (ref 5.7–26.3)

## 2023-02-02 LAB — PROTEIN ELECTROPHORESIS, SERUM
A/G Ratio: 1.4 (ref 0.7–1.7)
Albumin ELP: 3.8 g/dL (ref 2.9–4.4)
Alpha-1-Globulin: 0.2 g/dL (ref 0.0–0.4)
Alpha-2-Globulin: 0.6 g/dL (ref 0.4–1.0)
Beta Globulin: 1 g/dL (ref 0.7–1.3)
Gamma Globulin: 0.9 g/dL (ref 0.4–1.8)
Globulin, Total: 2.7 g/dL (ref 2.2–3.9)
Total Protein ELP: 6.5 g/dL (ref 6.0–8.5)

## 2023-02-03 ENCOUNTER — Inpatient Hospital Stay: Payer: Medicare HMO

## 2023-02-03 ENCOUNTER — Ambulatory Visit (HOSPITAL_COMMUNITY)
Admission: RE | Admit: 2023-02-03 | Discharge: 2023-02-03 | Disposition: A | Discharge status: Home / Self Care | Payer: Medicare HMO | Source: Ambulatory Visit | Attending: Nephrology | Admitting: Nephrology

## 2023-02-03 VITALS — BP 168/84 | HR 68 | Temp 98.0°F | Resp 18

## 2023-02-03 DIAGNOSIS — Z86711 Personal history of pulmonary embolism: Secondary | ICD-10-CM | POA: Diagnosis not present

## 2023-02-03 DIAGNOSIS — K317 Polyp of stomach and duodenum: Secondary | ICD-10-CM | POA: Diagnosis not present

## 2023-02-03 DIAGNOSIS — I5033 Acute on chronic diastolic (congestive) heart failure: Secondary | ICD-10-CM | POA: Insufficient documentation

## 2023-02-03 DIAGNOSIS — I129 Hypertensive chronic kidney disease with stage 1 through stage 4 chronic kidney disease, or unspecified chronic kidney disease: Secondary | ICD-10-CM | POA: Diagnosis not present

## 2023-02-03 DIAGNOSIS — R809 Proteinuria, unspecified: Secondary | ICD-10-CM | POA: Insufficient documentation

## 2023-02-03 DIAGNOSIS — I10 Essential (primary) hypertension: Secondary | ICD-10-CM | POA: Insufficient documentation

## 2023-02-03 DIAGNOSIS — Z8711 Personal history of peptic ulcer disease: Secondary | ICD-10-CM | POA: Diagnosis not present

## 2023-02-03 DIAGNOSIS — Z7901 Long term (current) use of anticoagulants: Secondary | ICD-10-CM | POA: Diagnosis not present

## 2023-02-03 DIAGNOSIS — E211 Secondary hyperparathyroidism, not elsewhere classified: Secondary | ICD-10-CM | POA: Insufficient documentation

## 2023-02-03 DIAGNOSIS — D509 Iron deficiency anemia, unspecified: Secondary | ICD-10-CM | POA: Diagnosis not present

## 2023-02-03 DIAGNOSIS — K219 Gastro-esophageal reflux disease without esophagitis: Secondary | ICD-10-CM | POA: Diagnosis not present

## 2023-02-03 DIAGNOSIS — Z809 Family history of malignant neoplasm, unspecified: Secondary | ICD-10-CM | POA: Diagnosis not present

## 2023-02-03 DIAGNOSIS — D631 Anemia in chronic kidney disease: Secondary | ICD-10-CM | POA: Insufficient documentation

## 2023-02-03 DIAGNOSIS — E1121 Type 2 diabetes mellitus with diabetic nephropathy: Secondary | ICD-10-CM | POA: Insufficient documentation

## 2023-02-03 DIAGNOSIS — N281 Cyst of kidney, acquired: Secondary | ICD-10-CM | POA: Diagnosis not present

## 2023-02-03 DIAGNOSIS — E1122 Type 2 diabetes mellitus with diabetic chronic kidney disease: Secondary | ICD-10-CM | POA: Diagnosis not present

## 2023-02-03 DIAGNOSIS — Z87891 Personal history of nicotine dependence: Secondary | ICD-10-CM | POA: Diagnosis not present

## 2023-02-03 DIAGNOSIS — N189 Chronic kidney disease, unspecified: Secondary | ICD-10-CM | POA: Insufficient documentation

## 2023-02-03 DIAGNOSIS — G4730 Sleep apnea, unspecified: Secondary | ICD-10-CM | POA: Diagnosis not present

## 2023-02-03 DIAGNOSIS — E785 Hyperlipidemia, unspecified: Secondary | ICD-10-CM | POA: Diagnosis not present

## 2023-02-03 DIAGNOSIS — D649 Anemia, unspecified: Secondary | ICD-10-CM | POA: Diagnosis not present

## 2023-02-03 DIAGNOSIS — Z79899 Other long term (current) drug therapy: Secondary | ICD-10-CM | POA: Diagnosis not present

## 2023-02-03 DIAGNOSIS — Z7952 Long term (current) use of systemic steroids: Secondary | ICD-10-CM | POA: Diagnosis not present

## 2023-02-03 MED ORDER — SODIUM CHLORIDE 0.9 % IV SOLN: Freq: Once | INTRAVENOUS | Status: AC

## 2023-02-03 MED ORDER — CETIRIZINE HCL 10 MG PO TABS
10.0000 mg | ORAL_TABLET | Freq: Once | ORAL | Status: AC
  Administered 2023-02-03: 10 mg via ORAL
  Filled 2023-02-03: qty 1

## 2023-02-03 MED ORDER — ACETAMINOPHEN 325 MG PO TABS
650.0000 mg | ORAL_TABLET | Freq: Once | ORAL | Status: AC
  Administered 2023-02-03: 650 mg via ORAL
  Filled 2023-02-03: qty 2

## 2023-02-03 MED ORDER — SODIUM CHLORIDE 0.9 % IV SOLN
500.0000 mg | Freq: Once | INTRAVENOUS | Status: AC
  Administered 2023-02-03: 500 mg via INTRAVENOUS
  Filled 2023-02-03: qty 20

## 2023-02-03 NOTE — Patient Instructions (Signed)
[facility]  Discharge Instructions: Thank you for choosing [facility] to provide your oncology and hematology care.  If you have a lab appointment with the Cancer Center - please note that after April 8th, 2024, all labs will be drawn in the cancer center.  You do not have to check in or register with the main entrance as you have in the past but will complete your check-in in the cancer center.  Wear comfortable clothing and clothing appropriate for easy access to any Portacath or PICC line.   We strive to give you quality time with your provider. You may need to reschedule your appointment if you arrive late (15 or more minutes).  Arriving late affects you and other patients whose appointments are after yours.  Also, if you miss three or more appointments without notifying the office, you may be dismissed from the clinic at the provider's discretion.      For prescription refill requests, have your pharmacy contact our office and allow 72 hours for refills to be completed.    Today you received the following Venofer, return as scheduled.   To help prevent nausea and vomiting after your treatment, we encourage you to take your nausea medication as directed.  BELOW ARE SYMPTOMS THAT SHOULD BE REPORTED IMMEDIATELY: *FEVER GREATER THAN 100.4 F (38 C) OR HIGHER *CHILLS OR SWEATING *NAUSEA AND VOMITING THAT IS NOT CONTROLLED WITH YOUR NAUSEA MEDICATION *UNUSUAL SHORTNESS OF BREATH *UNUSUAL BRUISING OR BLEEDING *URINARY PROBLEMS (pain or burning when urinating, or frequent urination) *BOWEL PROBLEMS (unusual diarrhea, constipation, pain near the anus) TENDERNESS IN MOUTH AND THROAT WITH OR WITHOUT PRESENCE OF ULCERS (sore throat, sores in mouth, or a toothache) UNUSUAL RASH, SWELLING OR PAIN  UNUSUAL VAGINAL DISCHARGE OR ITCHING   Items with * indicate a potential emergency and should be followed up as soon as possible or go to the Emergency Department if  any problems should occur.  Please show the CHEMOTHERAPY ALERT CARD or IMMUNOTHERAPY ALERT CARD at check-in to the Emergency Department and triage nurse.  Should you have questions after your visit or need to cancel or reschedule your appointment, please contact [facility] [phone]  and follow the prompts.  Office hours are 8:00 a.m. to 4:30 p.m. Monday - Friday. Please note that voicemails left after 4:00 p.m. may not be returned until the following business day.  We are closed weekends and major holidays. You have access to a nurse at all times for urgent questions. Please call the main number to the clinic [phone] and follow the prompts.  For any non-urgent questions, you may also contact your provider using MyChart. We now offer e-Visits for anyone 18 and older to request care online for non-urgent symptoms. For details visit [email or web address].   Also download the MyChart app! Go to the app store, search "MyChart", open the app, select , and log in with your MyChart username and password.   

## 2023-02-03 NOTE — Progress Notes (Signed)
Patient tolerated iron infusion with no complaints voiced.  Peripheral IV site clean and dry with good blood return noted before and after infusion.  Band aid applied.  VSS with discharge and left in satisfactory condition with no s/s of distress noted.   

## 2023-02-03 NOTE — Progress Notes (Signed)
Chaplain engaged in an initial visit with Andrew Adams and his wife, [name]. Chaplain introduced her role and offered support. They didn't have any needs at this time.  Chaplain Patrice Matthew, MDiv  02/03/23 1100  Spiritual Encounters  Type of Visit Initial  Care provided to: Pt and family  Reason for visit Routine spiritual support

## 2023-02-06 ENCOUNTER — Ambulatory Visit (HOSPITAL_COMMUNITY)
Admission: RE | Admit: 2023-02-06 | Discharge: 2023-02-06 | Disposition: A | Discharge status: Home / Self Care | Payer: Medicare HMO | Source: Ambulatory Visit | Attending: Nephrology | Admitting: Nephrology

## 2023-02-06 DIAGNOSIS — I10 Essential (primary) hypertension: Secondary | ICD-10-CM | POA: Insufficient documentation

## 2023-02-06 LAB — METHYLMALONIC ACID, SERUM: Methylmalonic Acid, Quantitative: 424 nmol/L — ABNORMAL HIGH (ref 0–378)

## 2023-02-07 ENCOUNTER — Encounter (HOSPITAL_COMMUNITY)
Admission: RE | Admit: 2023-02-07 | Discharge: 2023-02-07 | Disposition: A | Discharge status: Home / Self Care | Payer: Medicare HMO | Source: Ambulatory Visit | Attending: Nephrology | Admitting: Nephrology

## 2023-02-07 VITALS — BP 152/77 | HR 69 | Temp 98.3°F | Resp 20

## 2023-02-07 DIAGNOSIS — Z862 Personal history of diseases of the blood and blood-forming organs and certain disorders involving the immune mechanism: Secondary | ICD-10-CM | POA: Diagnosis not present

## 2023-02-07 DIAGNOSIS — N189 Chronic kidney disease, unspecified: Secondary | ICD-10-CM | POA: Insufficient documentation

## 2023-02-07 LAB — POCT HEMOGLOBIN-HEMACUE: Hemoglobin: 8.8 g/dL — ABNORMAL LOW (ref 13.0–17.0)

## 2023-02-07 MED ORDER — EPOETIN ALFA-EPBX 10000 UNIT/ML IJ SOLN
6000.0000 [IU] | Freq: Once | INTRAMUSCULAR | Status: AC
  Administered 2023-02-07: 6000 [IU] via SUBCUTANEOUS

## 2023-02-07 NOTE — Progress Notes (Signed)
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 AVS Declined  Performed by:  Evelena Peat, RN

## 2023-02-08 LAB — COPPER, SERUM: Copper: 118 ug/dL (ref 69–132)

## 2023-02-10 ENCOUNTER — Inpatient Hospital Stay: Payer: Medicare HMO

## 2023-02-10 VITALS — BP 165/93 | HR 65 | Temp 97.1°F | Resp 18

## 2023-02-10 DIAGNOSIS — D509 Iron deficiency anemia, unspecified: Secondary | ICD-10-CM

## 2023-02-10 DIAGNOSIS — Z8711 Personal history of peptic ulcer disease: Secondary | ICD-10-CM | POA: Diagnosis not present

## 2023-02-10 DIAGNOSIS — K317 Polyp of stomach and duodenum: Secondary | ICD-10-CM | POA: Diagnosis not present

## 2023-02-10 DIAGNOSIS — E1122 Type 2 diabetes mellitus with diabetic chronic kidney disease: Secondary | ICD-10-CM | POA: Diagnosis not present

## 2023-02-10 DIAGNOSIS — Z86711 Personal history of pulmonary embolism: Secondary | ICD-10-CM | POA: Diagnosis not present

## 2023-02-10 DIAGNOSIS — K219 Gastro-esophageal reflux disease without esophagitis: Secondary | ICD-10-CM | POA: Diagnosis not present

## 2023-02-10 DIAGNOSIS — E785 Hyperlipidemia, unspecified: Secondary | ICD-10-CM | POA: Diagnosis not present

## 2023-02-10 DIAGNOSIS — N189 Chronic kidney disease, unspecified: Secondary | ICD-10-CM | POA: Diagnosis not present

## 2023-02-10 DIAGNOSIS — D649 Anemia, unspecified: Secondary | ICD-10-CM | POA: Diagnosis not present

## 2023-02-10 DIAGNOSIS — Z87891 Personal history of nicotine dependence: Secondary | ICD-10-CM | POA: Diagnosis not present

## 2023-02-10 DIAGNOSIS — I129 Hypertensive chronic kidney disease with stage 1 through stage 4 chronic kidney disease, or unspecified chronic kidney disease: Secondary | ICD-10-CM | POA: Diagnosis not present

## 2023-02-10 DIAGNOSIS — Z7901 Long term (current) use of anticoagulants: Secondary | ICD-10-CM | POA: Diagnosis not present

## 2023-02-10 DIAGNOSIS — D631 Anemia in chronic kidney disease: Secondary | ICD-10-CM | POA: Diagnosis not present

## 2023-02-10 DIAGNOSIS — G4730 Sleep apnea, unspecified: Secondary | ICD-10-CM | POA: Diagnosis not present

## 2023-02-10 DIAGNOSIS — Z7952 Long term (current) use of systemic steroids: Secondary | ICD-10-CM | POA: Diagnosis not present

## 2023-02-10 DIAGNOSIS — Z79899 Other long term (current) drug therapy: Secondary | ICD-10-CM | POA: Diagnosis not present

## 2023-02-10 DIAGNOSIS — Z809 Family history of malignant neoplasm, unspecified: Secondary | ICD-10-CM | POA: Diagnosis not present

## 2023-02-10 MED ORDER — ACETAMINOPHEN 325 MG PO TABS
650.0000 mg | ORAL_TABLET | Freq: Once | ORAL | Status: AC
  Administered 2023-02-10: 650 mg via ORAL
  Filled 2023-02-10: qty 2

## 2023-02-10 MED ORDER — CETIRIZINE HCL 10 MG PO TABS
10.0000 mg | ORAL_TABLET | Freq: Once | ORAL | Status: AC
  Administered 2023-02-10: 10 mg via ORAL
  Filled 2023-02-10: qty 1

## 2023-02-10 MED ORDER — SODIUM CHLORIDE 0.9 % IV SOLN
500.0000 mg | Freq: Once | INTRAVENOUS | Status: AC
  Administered 2023-02-10: 500 mg via INTRAVENOUS
  Filled 2023-02-10: qty 20

## 2023-02-10 MED ORDER — SODIUM CHLORIDE 0.9 % IV SOLN: Freq: Once | INTRAVENOUS | Status: AC

## 2023-02-10 NOTE — Progress Notes (Signed)
Chaplain engaged in follow-up visit with Andrew Adams. No needs at this time.     02/10/23 1100  Spiritual Encounters  Type of Visit Follow up  Care provided to: Pt and family  Reason for visit Routine spiritual support

## 2023-02-10 NOTE — Progress Notes (Signed)
Patient presents today for iron infusion.  Patient is in satisfactory condition with no new complaints voiced.  Vital signs are stable.  We will proceed with infusion per provider orders. 

## 2023-02-10 NOTE — Progress Notes (Signed)
Treatment given per orders. Patient tolerated it well without problems. Vitals stable and discharged home from clinic via wheelchair Follow up as scheduled.  

## 2023-02-17 DIAGNOSIS — I4891 Unspecified atrial fibrillation: Secondary | ICD-10-CM | POA: Diagnosis not present

## 2023-02-17 DIAGNOSIS — E1122 Type 2 diabetes mellitus with diabetic chronic kidney disease: Secondary | ICD-10-CM | POA: Diagnosis not present

## 2023-02-17 DIAGNOSIS — E876 Hypokalemia: Secondary | ICD-10-CM | POA: Diagnosis not present

## 2023-02-17 DIAGNOSIS — E669 Obesity, unspecified: Secondary | ICD-10-CM | POA: Diagnosis not present

## 2023-02-17 DIAGNOSIS — D6869 Other thrombophilia: Secondary | ICD-10-CM | POA: Diagnosis not present

## 2023-02-17 DIAGNOSIS — N529 Male erectile dysfunction, unspecified: Secondary | ICD-10-CM | POA: Diagnosis not present

## 2023-02-17 DIAGNOSIS — G4733 Obstructive sleep apnea (adult) (pediatric): Secondary | ICD-10-CM | POA: Diagnosis not present

## 2023-02-17 DIAGNOSIS — E785 Hyperlipidemia, unspecified: Secondary | ICD-10-CM | POA: Diagnosis not present

## 2023-02-17 DIAGNOSIS — I471 Supraventricular tachycardia, unspecified: Secondary | ICD-10-CM | POA: Diagnosis not present

## 2023-02-17 DIAGNOSIS — J449 Chronic obstructive pulmonary disease, unspecified: Secondary | ICD-10-CM | POA: Diagnosis not present

## 2023-02-17 DIAGNOSIS — K219 Gastro-esophageal reflux disease without esophagitis: Secondary | ICD-10-CM | POA: Diagnosis not present

## 2023-02-17 DIAGNOSIS — I509 Heart failure, unspecified: Secondary | ICD-10-CM | POA: Diagnosis not present

## 2023-02-21 ENCOUNTER — Encounter (HOSPITAL_COMMUNITY)
Admission: RE | Admit: 2023-02-21 | Discharge: 2023-02-21 | Disposition: A | Discharge status: Home / Self Care | Payer: Medicare HMO | Source: Ambulatory Visit | Attending: Nephrology | Admitting: Nephrology

## 2023-02-21 VITALS — BP 134/85 | HR 66 | Temp 98.3°F | Resp 12

## 2023-02-21 DIAGNOSIS — N189 Chronic kidney disease, unspecified: Secondary | ICD-10-CM | POA: Diagnosis not present

## 2023-02-21 DIAGNOSIS — Z862 Personal history of diseases of the blood and blood-forming organs and certain disorders involving the immune mechanism: Secondary | ICD-10-CM

## 2023-02-21 LAB — POCT HEMOGLOBIN-HEMACUE: Hemoglobin: 9.6 g/dL — ABNORMAL LOW (ref 13.0–17.0)

## 2023-02-21 MED ORDER — EPOETIN ALFA-EPBX 10000 UNIT/ML IJ SOLN
6000.0000 [IU] | Freq: Once | INTRAMUSCULAR | Status: AC
  Administered 2023-02-21: 6000 [IU] via SUBCUTANEOUS

## 2023-02-21 NOTE — Progress Notes (Signed)
Diagnosis: Anemia in Chronic Kidney Disease  Provider:  Manpreet Bhutani MD  Procedure: Injection  Retacrit (epoetin alfa-epbx), Dose: 6000 units , Site: subcutaneous, Number of injections: 1  Hgb 9.6  Post Care: Patient declined observation  Discharge: Condition: Good, Destination: Home . AVS Provided  Performed by:  Evelena Peat, RN

## 2023-02-21 NOTE — Addendum Note (Signed)
Encounter addended by: Wyvonne Lenz, RN on: 02/21/2023 3:20 PM  Actions taken: Therapy plan modified

## 2023-02-22 NOTE — Addendum Note (Signed)
Encounter addended by: Evelena Peat, RN on: 02/22/2023 9:00 AM  Actions taken: Care Teams modified, Check Out activity completed

## 2023-02-28 ENCOUNTER — Encounter (HOSPITAL_COMMUNITY): Payer: Medicare HMO

## 2023-02-28 DIAGNOSIS — J449 Chronic obstructive pulmonary disease, unspecified: Secondary | ICD-10-CM | POA: Diagnosis not present

## 2023-03-06 ENCOUNTER — Other Ambulatory Visit (HOSPITAL_COMMUNITY)
Admission: RE | Admit: 2023-03-06 | Discharge: 2023-03-06 | Disposition: A | Discharge status: Home / Self Care | Payer: Medicare HMO | Source: Ambulatory Visit | Attending: Nephrology | Admitting: Nephrology

## 2023-03-06 DIAGNOSIS — E1122 Type 2 diabetes mellitus with diabetic chronic kidney disease: Secondary | ICD-10-CM | POA: Insufficient documentation

## 2023-03-06 DIAGNOSIS — N189 Chronic kidney disease, unspecified: Secondary | ICD-10-CM | POA: Diagnosis not present

## 2023-03-06 DIAGNOSIS — D631 Anemia in chronic kidney disease: Secondary | ICD-10-CM | POA: Insufficient documentation

## 2023-03-06 DIAGNOSIS — R809 Proteinuria, unspecified: Secondary | ICD-10-CM | POA: Insufficient documentation

## 2023-03-06 DIAGNOSIS — N2581 Secondary hyperparathyroidism of renal origin: Secondary | ICD-10-CM | POA: Insufficient documentation

## 2023-03-06 DIAGNOSIS — I130 Hypertensive heart and chronic kidney disease with heart failure and stage 1 through stage 4 chronic kidney disease, or unspecified chronic kidney disease: Secondary | ICD-10-CM | POA: Insufficient documentation

## 2023-03-06 DIAGNOSIS — I5033 Acute on chronic diastolic (congestive) heart failure: Secondary | ICD-10-CM | POA: Diagnosis not present

## 2023-03-06 LAB — RENAL FUNCTION PANEL
Albumin: 3.8 g/dL (ref 3.5–5.0)
Anion gap: 10 (ref 5–15)
BUN: 45 mg/dL — ABNORMAL HIGH (ref 8–23)
CO2: 28 mmol/L (ref 22–32)
Calcium: 8.4 mg/dL — ABNORMAL LOW (ref 8.9–10.3)
Chloride: 98 mmol/L (ref 98–111)
Creatinine, Ser: 2.72 mg/dL — ABNORMAL HIGH (ref 0.61–1.24)
GFR, Estimated: 23 mL/min — ABNORMAL LOW (ref >60)
Glucose, Bld: 140 mg/dL — ABNORMAL HIGH (ref 70–99)
Phosphorus: 4.1 mg/dL (ref 2.5–4.6)
Potassium: 3.3 mmol/L — ABNORMAL LOW (ref 3.5–5.1)
Sodium: 136 mmol/L (ref 135–145)

## 2023-03-06 LAB — CBC
HCT: 30.5 % — ABNORMAL LOW (ref 39.0–52.0)
Hemoglobin: 10.1 g/dL — ABNORMAL LOW (ref 13.0–17.0)
MCH: 28.3 pg (ref 26.0–34.0)
MCHC: 33.1 g/dL (ref 30.0–36.0)
MCV: 85.4 fL (ref 80.0–100.0)
Platelets: 123 10*3/uL — ABNORMAL LOW (ref 150–400)
RBC: 3.57 MIL/uL — ABNORMAL LOW (ref 4.22–5.81)
RDW: 13.3 % (ref 11.5–15.5)
WBC: 4.8 10*3/uL (ref 4.0–10.5)
nRBC: 0 % (ref 0.0–0.2)

## 2023-03-06 LAB — PROTEIN / CREATININE RATIO, URINE
Creatinine, Urine: 130 mg/dL
Protein Creatinine Ratio: 0.51 mg/mg{Cre} — ABNORMAL HIGH (ref 0.00–0.15)
Total Protein, Urine: 66 mg/dL

## 2023-03-07 ENCOUNTER — Encounter (HOSPITAL_COMMUNITY)
Admission: RE | Admit: 2023-03-07 | Discharge: 2023-03-07 | Disposition: A | Discharge status: Home / Self Care | Payer: Medicare HMO | Source: Ambulatory Visit | Attending: Nephrology | Admitting: Nephrology

## 2023-03-07 DIAGNOSIS — D631 Anemia in chronic kidney disease: Secondary | ICD-10-CM | POA: Diagnosis not present

## 2023-03-07 DIAGNOSIS — Z862 Personal history of diseases of the blood and blood-forming organs and certain disorders involving the immune mechanism: Secondary | ICD-10-CM | POA: Insufficient documentation

## 2023-03-07 DIAGNOSIS — I5032 Chronic diastolic (congestive) heart failure: Secondary | ICD-10-CM | POA: Diagnosis not present

## 2023-03-07 DIAGNOSIS — N2581 Secondary hyperparathyroidism of renal origin: Secondary | ICD-10-CM | POA: Diagnosis not present

## 2023-03-07 DIAGNOSIS — I129 Hypertensive chronic kidney disease with stage 1 through stage 4 chronic kidney disease, or unspecified chronic kidney disease: Secondary | ICD-10-CM | POA: Diagnosis not present

## 2023-03-07 DIAGNOSIS — N189 Chronic kidney disease, unspecified: Secondary | ICD-10-CM | POA: Insufficient documentation

## 2023-03-21 ENCOUNTER — Encounter (HOSPITAL_COMMUNITY)
Admission: RE | Admit: 2023-03-21 | Discharge: 2023-03-21 | Disposition: A | Discharge status: Home / Self Care | Payer: Medicare HMO | Source: Ambulatory Visit | Attending: Nephrology | Admitting: Nephrology

## 2023-03-21 VITALS — BP 137/88 | HR 75 | Temp 99.0°F | Resp 16

## 2023-03-21 DIAGNOSIS — Z862 Personal history of diseases of the blood and blood-forming organs and certain disorders involving the immune mechanism: Secondary | ICD-10-CM | POA: Diagnosis not present

## 2023-03-21 DIAGNOSIS — N189 Chronic kidney disease, unspecified: Secondary | ICD-10-CM | POA: Diagnosis not present

## 2023-03-21 LAB — POCT HEMOGLOBIN-HEMACUE: Hemoglobin: 11.2 g/dL — ABNORMAL LOW (ref 13.0–17.0)

## 2023-03-21 MED ORDER — EPOETIN ALFA-EPBX 10000 UNIT/ML IJ SOLN: 6000.0000 [IU] | Freq: Once | INTRAMUSCULAR | Status: DC

## 2023-03-21 NOTE — Progress Notes (Signed)
Diagnosis: Anemia in Chronic Kidney Disease  Provider:  Manpreet Bhutani MD  Procedure: Injection  Retacrit (epoetin alfa-epbx), Dose: 6000 units , Site: subcutaneous, Number of injections: 0  Injection not administered. Hgb 11.2  Post Care: Patient declined observation  Discharge: Condition: Good, Destination: Home . AVS Provided  Performed by:  Wyvonne Lenz, RN

## 2023-03-21 NOTE — Addendum Note (Signed)
Encounter addended by: Wyvonne Lenz, RN on: 03/21/2023 3:20 PM  Actions taken: Therapy plan modified

## 2023-03-28 DIAGNOSIS — Z6831 Body mass index (BMI) 31.0-31.9, adult: Secondary | ICD-10-CM | POA: Diagnosis not present

## 2023-03-28 DIAGNOSIS — N1832 Chronic kidney disease, stage 3b: Secondary | ICD-10-CM | POA: Diagnosis not present

## 2023-03-28 DIAGNOSIS — R030 Elevated blood-pressure reading, without diagnosis of hypertension: Secondary | ICD-10-CM | POA: Diagnosis not present

## 2023-03-28 DIAGNOSIS — N189 Chronic kidney disease, unspecified: Secondary | ICD-10-CM | POA: Diagnosis not present

## 2023-03-28 DIAGNOSIS — I2699 Other pulmonary embolism without acute cor pulmonale: Secondary | ICD-10-CM | POA: Diagnosis not present

## 2023-03-28 DIAGNOSIS — E1122 Type 2 diabetes mellitus with diabetic chronic kidney disease: Secondary | ICD-10-CM | POA: Diagnosis not present

## 2023-03-28 DIAGNOSIS — I5032 Chronic diastolic (congestive) heart failure: Secondary | ICD-10-CM | POA: Diagnosis not present

## 2023-03-30 ENCOUNTER — Encounter (HOSPITAL_COMMUNITY): Payer: Medicare HMO

## 2023-03-31 ENCOUNTER — Inpatient Hospital Stay: Payer: Medicare HMO

## 2023-03-31 ENCOUNTER — Inpatient Hospital Stay: Payer: Medicare HMO | Attending: Hematology

## 2023-03-31 DIAGNOSIS — I10 Essential (primary) hypertension: Secondary | ICD-10-CM | POA: Diagnosis not present

## 2023-03-31 DIAGNOSIS — E782 Mixed hyperlipidemia: Secondary | ICD-10-CM | POA: Diagnosis not present

## 2023-03-31 DIAGNOSIS — K219 Gastro-esophageal reflux disease without esophagitis: Secondary | ICD-10-CM | POA: Diagnosis not present

## 2023-03-31 DIAGNOSIS — D649 Anemia, unspecified: Secondary | ICD-10-CM | POA: Diagnosis not present

## 2023-03-31 DIAGNOSIS — J449 Chronic obstructive pulmonary disease, unspecified: Secondary | ICD-10-CM | POA: Diagnosis not present

## 2023-03-31 LAB — CBC WITH DIFFERENTIAL/PLATELET
Abs Immature Granulocytes: 0.01 10*3/uL (ref 0.00–0.07)
Basophils Absolute: 0 10*3/uL (ref 0.0–0.1)
Basophils Relative: 0 %
Eosinophils Absolute: 0.1 10*3/uL (ref 0.0–0.5)
Eosinophils Relative: 3 %
HCT: 29.6 % — ABNORMAL LOW (ref 39.0–52.0)
Hemoglobin: 9.9 g/dL — ABNORMAL LOW (ref 13.0–17.0)
Immature Granulocytes: 0 %
Lymphocytes Relative: 29 %
Lymphs Abs: 1.5 10*3/uL (ref 0.7–4.0)
MCH: 28.7 pg (ref 26.0–34.0)
MCHC: 33.4 g/dL (ref 30.0–36.0)
MCV: 85.8 fL (ref 80.0–100.0)
Monocytes Absolute: 0.5 10*3/uL (ref 0.1–1.0)
Monocytes Relative: 9 %
Neutro Abs: 3 10*3/uL (ref 1.7–7.7)
Neutrophils Relative %: 59 %
Platelets: 127 10*3/uL — ABNORMAL LOW (ref 150–400)
RBC: 3.45 MIL/uL — ABNORMAL LOW (ref 4.22–5.81)
RDW: 13.2 % (ref 11.5–15.5)
WBC: 5 10*3/uL (ref 4.0–10.5)
nRBC: 0 % (ref 0.0–0.2)

## 2023-03-31 LAB — FERRITIN: Ferritin: 413 ng/mL — ABNORMAL HIGH (ref 24–336)

## 2023-03-31 LAB — IRON AND TIBC
Iron: 60 ug/dL (ref 45–182)
Saturation Ratios: 24 % (ref 17.9–39.5)
TIBC: 252 ug/dL (ref 250–450)
UIBC: 192 ug/dL

## 2023-04-04 ENCOUNTER — Encounter (HOSPITAL_COMMUNITY)
Admission: RE | Admit: 2023-04-04 | Discharge: 2023-04-04 | Disposition: A | Discharge status: Home / Self Care | Payer: Medicare HMO | Source: Ambulatory Visit | Attending: Nephrology | Admitting: Nephrology

## 2023-04-04 VITALS — BP 158/94 | HR 73 | Temp 98.9°F | Resp 16

## 2023-04-04 DIAGNOSIS — N189 Chronic kidney disease, unspecified: Secondary | ICD-10-CM | POA: Insufficient documentation

## 2023-04-04 DIAGNOSIS — Z862 Personal history of diseases of the blood and blood-forming organs and certain disorders involving the immune mechanism: Secondary | ICD-10-CM | POA: Insufficient documentation

## 2023-04-04 MED ORDER — EPOETIN ALFA-EPBX 10000 UNIT/ML IJ SOLN
6000.0000 [IU] | Freq: Once | INTRAMUSCULAR | Status: AC
  Administered 2023-04-04: 6000 [IU] via SUBCUTANEOUS

## 2023-04-04 NOTE — Progress Notes (Signed)
Diagnosis: Anemia in Chronic Kidney Disease  Provider:  Celso Amy MD  Procedure: Injection  Retacrit (epoetin alfa-epbx), Dose: 6000 units , Site: subcutaneous, Number of injections: 1  Hgb 9.9 per blood draw on 03/31/23.  Post Care: Patient declined observation  Discharge: Condition: Good, Destination: Home . AVS Provided  Performed by:  Wyvonne Lenz, RN

## 2023-04-04 NOTE — Addendum Note (Signed)
Encounter addended by: Wyvonne Lenz, RN on: 04/04/2023 4:21 PM  Actions taken: Therapy plan modified

## 2023-04-04 NOTE — Addendum Note (Signed)
Encounter addended by: Wyvonne Lenz, RN on: 04/04/2023 3:52 PM  Actions taken: Actions taken from a BestPractice Advisory, Allergies reviewed

## 2023-04-05 NOTE — Progress Notes (Signed)
[facility] [address], [place] [address]   Clinic Day:  04/06/2023  Referring physician: Lovey Newcomer, PA  Patient Care Team: Lovey Newcomer, PA as PCP - General Mealor, Roberts Gaudy, MD as PCP - Electrophysiology (Cardiology) Mallipeddi, Orion Modest, MD as PCP - Cardiology (Cardiology) Randa Lynn, MD as Consulting Physician (Nephrology) Doreatha Massed, MD as Medical Oncologist (Hematology)   ASSESSMENT & PLAN:   Assessment:  1.  Normocytic anemia: - Patient seen at the request of Dr. Wolfgang Phoenix - On iron supplement daily since 2021.  No prior history of blood transfusion. - Colonoscopy (08/28/2020): Nonbleeding internal hemorrhoids, diverticulosis. - EGD (02/07/2022): Multiple gastric polyps. - Denies any bleeding per rectum or melena. - Retacrit 3000 units on 12/05/2022, 01/02/2023, increased to 6000 units on 01/24/2023.  2.  Social/family history: - Lives with wife at home.  Independent of ADLs and IADLs.  Has been on O2 via nasal cannula since December 2023.  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  Quit smoking 25 years ago. - No family history of severe anemia.  Sister has cancer but type unknown to the patient.  Plan:  1.  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 -Lab work from 03/31/2023 showed hemoglobin 9.9 with a normal differential, ferritin 413, iron saturation 25%, folate 17.3, B12 1149.  -Protein electrophoresis did not reveal M spike.  Kappa and lambda free light chains elevated without elevation in kappa lambda light chain ratio. -Recommend UPEP 24-hour urine and serum immunofixation electrophoresis when he returns in 6 weeks. -He received 2 doses of IV Venofer on 02/03/2023 and 02/10/2023. -He received Retacrit 6000 units every 2 weeks last given on 04/04/2023. -He does not need any additional IV iron at this time.   Orders Placed This Encounter  Procedures   24 hr Ur  UPEP/UIFE/Light Chains/TP    Standing Status:   Future    Standing Expiration Date:   04/05/2024   CBC with Differential/Platelet    Standing Status:   Future    Standing Expiration Date:   04/05/2024   Ferritin    Standing Status:   Future    Standing Expiration Date:   04/05/2024   Iron and TIBC ([facility] ONLY)    Standing Status:   Future    Standing Expiration Date:   04/05/2024   Immunofixation electrophoresis    Standing Status:   Future    Standing Expiration Date:   04/05/2024   I spent 25 minutes dedicated to the care of this patient (face-to-face and non-face-to-face) on the date of the encounter to include what is described in the assessment and plan.  Mauro Kaufmann, NP   6/6/202411:56 AM  CHIEF COMPLAINT/PURPOSE OF CONSULT:   Diagnosis: anemia from CKD  Current Therapy:  Retacrit injections  HISTORY OF PRESENT ILLNESS:   Andrew Adams is a 77 y.o. male presenting to clinic today for evaluation of anemia at the request of Dr. Wolfgang Phoenix.  Today, he states that he is doing well overall. His appetite level is at 95%. His energy level is at 50%.  He has a longstanding history of CKD, followed by nephrology.  He continues every 2 weeks Retacrit injections.  Tolerating these well.  Denies any bleeding.  Reports no change in his energy level since receiving IV iron last month.  Denies any unintentional weight loss, no change in appetite.  Continues to wear his oxygen 24/7.   PAST MEDICAL HISTORY:  Past Medical History: Past Medical History:  Diagnosis Date   Anemia    Borderline diabetic    Chronic kidney disease    Diabetes mellitus without complication (HCC)    Duodenal adenoma    2021   GERD (gastroesophageal reflux disease)    Gout    Gouty arthritis    H/O small bowel obstruction    History of back surgery    Hx of adenomatous colonic polyps    2021   Hyperlipidemia    Hypertension    PE (pulmonary embolism)    Sleep apnea     Surgical  History: Past Surgical History:  Procedure Laterality Date   ABDOMINAL SURGERY  2103   Smal bowel obstruction    ABDOMINAL SURGERY  1981   Perforated large intestine   back     Lower back   BIOPSY  08/28/2020   Procedure: BIOPSY;  Surgeon: Dolores Frame, MD;  [facility]: [facility];  Service: Gastroenterology;;   BIOPSY  02/07/2022   Procedure: BIOPSY;  Surgeon: Lemar Lofty., MD;  Location: [facility];  Service: Gastroenterology;;   CATARACT EXTRACTION W/PHACO Right 01/06/2020   Procedure: CATARACT EXTRACTION PHACO AND INTRAOCULAR LENS PLACEMENT (IOC) (CDE: 6.48);  Surgeon: Fabio Pierce, MD;  Location: [facility];  Service: Ophthalmology;  Laterality: Right;   CATARACT EXTRACTION W/PHACO Left 01/20/2020   Procedure: CATARACT EXTRACTION PHACO AND INTRAOCULAR LENS PLACEMENT (IOC);  Surgeon: Fabio Pierce, MD;  Location: [facility];  Service: Ophthalmology;  Laterality: Left;  CDE: 5.86   COLONOSCOPY WITH PROPOFOL N/A 08/28/2020   Procedure: COLONOSCOPY WITH PROPOFOL;  Surgeon: Dolores Frame, MD;  [facility]: [facility];  Service: Gastroenterology;  Laterality: N/A;  230   ENDOSCOPIC MUCOSAL RESECTION N/A 02/07/2022   Procedure: ENDOSCOPIC MUCOSAL RESECTION;  Surgeon: Meridee Score Netty Starring., MD;  Location: [facility];  Service: Gastroenterology;  Laterality: N/A;   ESOPHAGOGASTRODUODENOSCOPY N/A 02/07/2022   Procedure: ESOPHAGOGASTRODUODENOSCOPY (EGD);  Surgeon: Lemar Lofty., MD;  Location: [facility];  Service: Gastroenterology;  Laterality: N/A;   ESOPHAGOGASTRODUODENOSCOPY (EGD) WITH PROPOFOL N/A 08/28/2020   Procedure: ESOPHAGOGASTRODUODENOSCOPY (EGD) WITH PROPOFOL;  Surgeon: Dolores Frame, MD;  [facility]: [facility];  Service: Gastroenterology;  Laterality: N/A;   ESOPHAGOGASTRODUODENOSCOPY (EGD) WITH PROPOFOL N/A 12/14/2020   Procedure: ESOPHAGOGASTRODUODENOSCOPY (EGD) WITH PROPOFOL;  Surgeon: Meridee Score Netty Starring., MD;   Location: [facility];  Service: Gastroenterology;  Laterality: N/A;   GIVENS CAPSULE STUDY N/A 09/10/2020   Procedure: GIVENS CAPSULE STUDY;  Surgeon: Dolores Frame, MD;  [facility]: [facility];  Service: Gastroenterology;  Laterality: N/A;  730   HEMOSTASIS CLIP PLACEMENT  12/14/2020   Procedure: HEMOSTASIS CLIP PLACEMENT;  Surgeon: Lemar Lofty., MD;  Location: [facility];  Service: Gastroenterology;;   HEMOSTASIS CLIP PLACEMENT  02/07/2022   Procedure: HEMOSTASIS CLIP PLACEMENT;  Surgeon: Lemar Lofty., MD;  Location: [facility];  Service: Gastroenterology;;   HEMOSTASIS CONTROL  12/14/2020   Procedure: HEMOSTASIS CONTROL;  Surgeon: Lemar Lofty., MD;  Location: [facility];  Service: Gastroenterology;;   HERNIA REPAIR     incisional   HOT HEMOSTASIS N/A 02/07/2022   Procedure: HOT HEMOSTASIS (ARGON PLASMA COAGULATION/BICAP);  Surgeon: Lemar Lofty., MD;  Location: [facility];  Service: Gastroenterology;  Laterality: N/A;   POLYPECTOMY  08/28/2020   Procedure: POLYPECTOMY;  Surgeon: Dolores Frame, MD;  [facility]: [facility];  Service: Gastroenterology;;   POLYPECTOMY  12/14/2020   Procedure: POLYPECTOMY;  Surgeon: Lemar Lofty., MD;  Location: [facility];  Service: Gastroenterology;;   Susa Day  12/14/2020   Procedure: Susa Day;  Surgeon: Mansouraty, Netty Starring., MD;  Location: [facility];  Service: Gastroenterology;;   Brooke Dare INJECTION  02/07/2022   Procedure: SUBMUCOSAL LIFTING INJECTION;  Surgeon: Lemar Lofty., MD;  Location: [facility];  Service: Gastroenterology;;   SUBMUCOSAL TATTOO INJECTION  12/14/2020   Procedure: SUBMUCOSAL TATTOO INJECTION;  Surgeon: Lemar Lofty., MD;  Location: [facility];  Service: Gastroenterology;;    Social History: Social History   Socioeconomic History   Marital status: Married    Spouse name: Not on file   Number of  children: 1   Years of education: Not on file   Highest education level: Not on file  Occupational History   Occupation: Finisher  Tobacco Use   Smoking status: Former    Packs/day: 1.00    Years: 38.00    Additional pack years: 0.00    Total pack years: 38.00    Types: Cigarettes    Quit date: 02/07/1995    Years since quitting: 28.1   Smokeless tobacco: Never  Vaping Use   Vaping Use: Never used  Substance and Sexual Activity   Alcohol use: Yes    Alcohol/week: 2.0 standard drinks of alcohol    Types: 2 Shots of liquor per week    Comment: occasional    Drug use: Yes    Types: Marijuana   Sexual activity: Yes  Other Topics Concern   Not on file  Social History Narrative   Lives with wife.     Social Determinants of Health   Financial Resource Strain: Not on file  Food Insecurity: No Food Insecurity (01/31/2023)   Hunger Vital Sign    Worried About Running Out of Food in the Last Year: Never true    Ran Out of Food in the Last Year: Never true  Transportation Needs: No Transportation Needs (01/31/2023)   PRAPARE - Administrator, Civil Service (Medical): No    Lack of Transportation (Non-Medical): No  Physical Activity: Not on file  Stress: Not on file  Social Connections: Not on file  Intimate Partner Violence: Not At Risk (01/31/2023)   Humiliation, Afraid, Rape, and Kick questionnaire    Fear of Current or Ex-Partner: No    Emotionally Abused: No    Physically Abused: No    Sexually Abused: No    Family History: Family History  Problem Relation Age of Onset   Hypertension Sister    Diabetes Sister    Diabetes Sister    Hypertension Mother    Diabetes Mother    Hypertension Father    Ulcers Father    Colon cancer Neg Hx    Esophageal cancer Neg Hx    Stomach cancer Neg Hx    Inflammatory bowel disease Neg Hx    Liver disease Neg Hx    Pancreatic cancer Neg Hx    Rectal cancer Neg Hx     Current Medications:  Current Outpatient  Medications:    albuterol (VENTOLIN HFA) 108 (90 Base) MCG/ACT inhaler, Inhale 2 puffs into the lungs every 4 (four) hours as needed., Disp: , Rfl:    allopurinol (ZYLOPRIM) 100 MG tablet, Take 1 tablet (100 mg total) by mouth daily., Disp: 90 tablet, Rfl: 1   amLODipine (NORVASC) 5 MG tablet, Take 5 mg by mouth daily., Disp: , Rfl:    apixaban (ELIQUIS) 5 MG TABS tablet, Take 2 tablets (10 mg total) by mouth 2 (two) times  daily. On 08/27/22, start 5 mg  (1 tab) two times daily, Disp: 74 tablet, Rfl: 1   atorvastatin (LIPITOR) 40 MG tablet, Take 1 tablet (40 mg total) by mouth daily., Disp: 90 tablet, Rfl: 1   bisoprolol (ZEBETA) 5 MG tablet, Take 1 tablet (5 mg total) by mouth daily., Disp: 90 tablet, Rfl: 3   budesonide (PULMICORT) 0.5 MG/2ML nebulizer solution, Take 0.5 mg by nebulization 2 (two) times daily., Disp: , Rfl:    epoetin alfa (EPOGEN) 3000 UNIT/ML injection, Inject into the skin., Disp: , Rfl:    epoetin alfa-epbx (RETACRIT) 3000 UNIT/ML injection, Inject 3,000 Units into the vein every 14 (fourteen) days., Disp: , Rfl:    FEROSUL 325 (65 Fe) MG tablet, TAKE ONE TABLET BY MOUTH EVERY MORNING WITH BREAKFAST, Disp: 90 tablet, Rfl: 3   furosemide (LASIX) 40 MG tablet, Take 1 tablet (40 mg total) by mouth daily. (Patient taking differently: Take 2 tabs (80mg ) by mouth every morning & 1 tab (40mg ) every evening), Disp: 30 tablet, Rfl: 1   hydrALAZINE (APRESOLINE) 25 MG tablet, Take 1 tablet (25 mg total) by mouth every 6 (six) hours., Disp: 90 tablet, Rfl: 1   ipratropium (ATROVENT) 0.02 % nebulizer solution, Inhale into the lungs., Disp: , Rfl:    ipratropium-albuterol (DUONEB) 0.5-2.5 (3) MG/3ML SOLN, Take 3 mLs by nebulization every 6 (six) hours., Disp: 360 mL, Rfl: 1   Multiple Vitamin (MULTIVITAMIN WITH MINERALS) TABS tablet, Take 1 tablet by mouth daily., Disp:  , Rfl:    pantoprazole (PROTONIX) 40 MG tablet, Take 1 tablet (40 mg total) by mouth 2 (two) times daily before a meal.  (Patient taking differently: Take 40 mg by mouth daily.), Disp: 60 tablet, Rfl: 12   potassium chloride (KLOR-CON) 10 MEQ tablet, Take 10 mEq by mouth daily., Disp: , Rfl:    predniSONE (DELTASONE) 20 MG tablet, Take 1 tablet (20 mg total) by mouth daily with breakfast., Disp: 3 tablet, Rfl: 0   TRULICITY 0.75 MG/0.5ML SOPN, Inject 0.75 mg into the skin every Friday., Disp: , Rfl:    Allergies: No Known Allergies  REVIEW OF SYSTEMS:   Review of Systems  Constitutional:  Positive for fatigue. Negative for appetite change, chills and fever.  HENT:  Negative.  Negative for hearing loss, lump/mass, mouth sores and nosebleeds.   Eyes: Negative.  Negative for eye problems.  Respiratory:  Positive for shortness of breath (chronic). Negative for cough and hemoptysis.   Cardiovascular: Negative.  Negative for chest pain and leg swelling.  Gastrointestinal: Negative.  Negative for abdominal pain, blood in stool, constipation, diarrhea, nausea and vomiting.  Endocrine: Negative.  Negative for hot flashes.  Genitourinary: Negative.  Negative for bladder incontinence, difficulty urinating, dysuria, frequency and hematuria.   Musculoskeletal: Negative.  Negative for back pain, flank pain, gait problem and myalgias.  Skin: Negative.  Negative for itching and rash.  Neurological: Negative.  Negative for dizziness, gait problem, headaches, light-headedness and numbness.  Hematological: Negative.  Negative for adenopathy.  Psychiatric/Behavioral:  Negative for confusion. The patient is not nervous/anxious.      VITALS:   Blood pressure (!) 173/96, pulse 70, temperature 98.3 F (36.8 C), temperature source Tympanic, resp. rate 16, weight 214 lb 12.8 oz (97.4 kg), SpO2 95 %.  Wt Readings from Last 3 Encounters:  04/06/23 214 lb 12.8 oz (97.4 kg)  01/31/23 219 lb 5.7 oz (99.5 kg)  01/23/23 220 lb (99.8 kg)    Body mass index is 31.72 kg/m.  PHYSICAL EXAM:   Physical Exam Constitutional:       Appearance: Normal appearance.  HENT:     Head: Normocephalic and atraumatic.  Eyes:     Pupils: Pupils are equal, round, and reactive to light.  Cardiovascular:     Rate and Rhythm: Normal rate and regular rhythm.     Heart sounds: Normal heart sounds. No murmur heard. Pulmonary:     Effort: Pulmonary effort is normal.     Breath sounds: Normal breath sounds. No wheezing.  Abdominal:     General: Bowel sounds are normal. There is no distension.     Palpations: Abdomen is soft.     Tenderness: There is no abdominal tenderness.  Musculoskeletal:        General: Normal range of motion.     Cervical back: Normal range of motion.  Skin:    General: Skin is warm and dry.     Findings: No rash.  Neurological:     Mental Status: He is alert and oriented to person, place, and time.  Psychiatric:        Judgment: Judgment normal.     LABS:      Latest Ref Rng & Units 03/31/2023    2:11 PM 03/21/2023    3:11 PM 03/06/2023    8:06 AM  CBC  WBC 4.0 - 10.5 K/uL 5.0   4.8   Hemoglobin 13.0 - 17.0 g/dL 9.9  82.9  56.2   Hematocrit 39.0 - 52.0 % 29.6   30.5   Platelets 150 - 400 K/uL 127   123       Latest Ref Rng & Units 03/06/2023    8:06 AM 01/13/2023    1:28 PM 11/17/2022    3:10 PM  CMP  Glucose 70 - 99 mg/dL 130  865  784   BUN 8 - 23 mg/dL 45  36  19   Creatinine 0.61 - 1.24 mg/dL 6.96  2.95  2.84   Sodium 135 - 145 mmol/L 136  135  132   Potassium 3.5 - 5.1 mmol/L 3.3  3.8  3.7   Chloride 98 - 111 mmol/L 98  99  92   CO2 22 - 32 mmol/L 28  27  29    Calcium 8.9 - 10.3 mg/dL 8.4  8.1    8.6  8.1    8.2      No results found for: "CEA1", "CEA" / No results found for: "CEA1", "CEA" No results found for: "PSA1" No results found for: "CAN199" No results found for: "CAN125"  Lab Results  Component Value Date   TOTALPROTELP 6.5 01/31/2023   ALBUMINELP 3.8 01/31/2023   A1GS 0.2 01/31/2023   A2GS 0.6 01/31/2023   BETS 1.0 01/31/2023   GAMS 0.9 01/31/2023   MSPIKE Not  Observed 01/31/2023   SPEI Comment 01/31/2023   Lab Results  Component Value Date   TIBC 252 03/31/2023   TIBC 299 01/13/2023   TIBC 256 11/17/2022   FERRITIN 413 (H) 03/31/2023   FERRITIN 43 01/13/2023   FERRITIN 172 11/17/2022   IRONPCTSAT 24 03/31/2023   IRONPCTSAT 26 01/13/2023   IRONPCTSAT 27 11/17/2022   Lab Results  Component Value Date   LDH 168 01/31/2023     STUDIES:   No results found.

## 2023-04-06 ENCOUNTER — Inpatient Hospital Stay: Payer: Medicare HMO | Attending: Hematology | Admitting: Oncology

## 2023-04-06 VITALS — BP 176/88 | HR 70 | Temp 98.3°F | Resp 16 | Wt 214.8 lb

## 2023-04-06 DIAGNOSIS — D649 Anemia, unspecified: Secondary | ICD-10-CM | POA: Diagnosis not present

## 2023-04-06 DIAGNOSIS — E785 Hyperlipidemia, unspecified: Secondary | ICD-10-CM | POA: Insufficient documentation

## 2023-04-06 DIAGNOSIS — I129 Hypertensive chronic kidney disease with stage 1 through stage 4 chronic kidney disease, or unspecified chronic kidney disease: Secondary | ICD-10-CM | POA: Diagnosis not present

## 2023-04-06 DIAGNOSIS — Z86718 Personal history of other venous thrombosis and embolism: Secondary | ICD-10-CM | POA: Diagnosis not present

## 2023-04-06 DIAGNOSIS — N189 Chronic kidney disease, unspecified: Secondary | ICD-10-CM | POA: Insufficient documentation

## 2023-04-06 DIAGNOSIS — E119 Type 2 diabetes mellitus without complications: Secondary | ICD-10-CM | POA: Diagnosis not present

## 2023-04-06 DIAGNOSIS — Z79899 Other long term (current) drug therapy: Secondary | ICD-10-CM | POA: Diagnosis not present

## 2023-04-06 DIAGNOSIS — K219 Gastro-esophageal reflux disease without esophagitis: Secondary | ICD-10-CM | POA: Insufficient documentation

## 2023-04-06 DIAGNOSIS — Z86010 Personal history of colonic polyps: Secondary | ICD-10-CM | POA: Insufficient documentation

## 2023-04-11 ENCOUNTER — Ambulatory Visit: Payer: Medicare HMO | Admitting: Adult Health

## 2023-04-11 ENCOUNTER — Encounter: Payer: Self-pay | Admitting: Adult Health

## 2023-04-11 ENCOUNTER — Other Ambulatory Visit (HOSPITAL_COMMUNITY)
Admission: RE | Admit: 2023-04-11 | Discharge: 2023-04-11 | Disposition: A | Discharge status: Home / Self Care | Payer: Medicare HMO | Source: Ambulatory Visit | Attending: Oncology | Admitting: Oncology

## 2023-04-11 VITALS — BP 158/71 | HR 70 | Ht 69.0 in | Wt 216.0 lb

## 2023-04-11 DIAGNOSIS — R911 Solitary pulmonary nodule: Secondary | ICD-10-CM | POA: Diagnosis not present

## 2023-04-11 DIAGNOSIS — J9611 Chronic respiratory failure with hypoxia: Secondary | ICD-10-CM

## 2023-04-11 DIAGNOSIS — N184 Chronic kidney disease, stage 4 (severe): Secondary | ICD-10-CM

## 2023-04-11 DIAGNOSIS — I5032 Chronic diastolic (congestive) heart failure: Secondary | ICD-10-CM

## 2023-04-11 DIAGNOSIS — G4733 Obstructive sleep apnea (adult) (pediatric): Secondary | ICD-10-CM | POA: Diagnosis not present

## 2023-04-11 DIAGNOSIS — I2699 Other pulmonary embolism without acute cor pulmonale: Secondary | ICD-10-CM

## 2023-04-11 DIAGNOSIS — D649 Anemia, unspecified: Secondary | ICD-10-CM | POA: Insufficient documentation

## 2023-04-11 DIAGNOSIS — J449 Chronic obstructive pulmonary disease, unspecified: Secondary | ICD-10-CM | POA: Diagnosis not present

## 2023-04-11 MED ORDER — IPRATROPIUM-ALBUTEROL 0.5-2.5 (3) MG/3ML IN SOLN: 3.0000 mL | Freq: Four times a day (QID) | RESPIRATORY_TRACT | 5 refills | Status: DC | PRN

## 2023-04-11 NOTE — Assessment & Plan Note (Signed)
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  Needs to be on a maintenance inhaler/nebulizer.  For now we will restart DuoNeb twice daily.  If continues to have recurrent flares can look into cost of adding Brovana, budesonide and Yupelri.   Plan  Patient Instructions  Restart Duoneb Twice daily.  May use Albuterol inhaler or Duoneb As needed   Continue on CPAP At bedtime with O2 at 2l/m , wear all night long .  Continue on 2l/m Oxygen .  Continue on Eliquis .  Report any bleeding. Avoid all NSAIDS- aleve, ibuprofen, advil, etc.  CT chest in 6 weeks .  Follow up with Dr. Craige Cotta  in 2 months and As needed

## 2023-04-11 NOTE — Patient Instructions (Addendum)
Restart Duoneb Twice daily.  May use Albuterol inhaler or Duoneb As needed   Continue on CPAP At bedtime with O2 at 2l/m , wear all night long .  Continue on 2l/m Oxygen .  Continue on Eliquis .  Report any bleeding. Avoid all NSAIDS- aleve, ibuprofen, advil, etc.  CT chest in 6 weeks .  Follow up with Dr. Craige Cotta  in 2 months and As needed

## 2023-04-11 NOTE — Assessment & Plan Note (Signed)
CT chest January 31, 2023 showed new area of groundglass nodularity in the right upper lobe measuring 2.0 x 1.0 cm.  Stable left lower lobe nodule measuring 1.8 cm.  And scattered pulmonary nodules stable.  Will recheck CT chest in 6 weeks for 59-month interval.  May need to consider PET scan going forward.

## 2023-04-11 NOTE — Assessment & Plan Note (Signed)
Continue on oxygen 2 L to maintain O2 saturations greater than 88 to 9%

## 2023-04-11 NOTE — Progress Notes (Signed)
@Patient  ID: Andrew Adams, male    DOB: Aug 26, 1946, 77 y.o.   MRN: [id number]  Chief Complaint  Patient presents with   Follow-up    Referring provider: Lovey Newcomer, PA  HPI: 77 year old male former smoker followed for COPD, chronic respiratory failure and sleep apnea, lung nodule Medical history significant for chronic kidney disease stage IV, diabetes  TEST/EVENTS :  PFT 07/07/20 >> 1.81 (68%), FEV1% 77, TLC 4.62 (67%), DLCO 39%, +BD ABG with FiO2 30% 02/14/20 >> pH 7.34, PCO2 66.5, PO2 70.3   Chest Imaging:  CT angio chest 07/03/12 >> acute PE RUL CT chest 02/19/21 >> 1.8 x 1 cm  LLL nodule stable, 7 mm RLL nodule stable, 3 mm LLL nodule stable CT chest 02/17/22 >> no change LLL nodule   Sleep Tests:  PSG 05/31/20 >> AHI 29.4, SpO2 low 73%, used 2 liters O2 Auto CPAP 3/35/33 to 02/20/21 >> used on 30 of 30 nights with average 8 hrs 31 min.  Average AHI 0.3 with median CPAP 6 and 95 th percentile CPAP 10 cm H2O ONO with CPAP 02/24/21 >> used on 9 hrs 10 min.  Baseline SpO2 91%, SpO2 low 79%.  Spent 1 hr 4 min with SpO2 < 88%.   Cardiac Tests:  Echo 10/30/19 >> EF 55 to 60%, mild LVH, grade 1 DD, mod LA dilation, mild MR    04/11/2023 Follow up : COPD, O2 RF , OSA , lung nodule , PE  Patient returns for 1 year follow-up.  Patient has underlying COPD and chronic respiratory failure on Home O2 and OSA on CPAP .   Since last visit has been hospitalized twice .  Patient was admitted in October with increased shortness of breath found to have an unprovoked PE.  VQ scan showed a peripheral wedge-shaped perfusion defect in the left lower lobe.  Patient was started on Lovenox and transition to Eliquis.  Patient had previously had a pulmonary emboli around 10 years ago and took anticoagulation therapy for 1 year.  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  Endorses compliance of Eliquis.  Being refilled by his primary care provider .    Patient was  readmitted in  December 2023 with a COPD exacerbation complicated by volume overload.  He was treated with antibiotics and steroids.  Pulmicort and Brovana were added to his nebulizer regimen.  Patient did require initial BiPAP support as he had significant hypercarbia with a pH of 7.14 and a pCO2 at 98.  Patient was transitioned back to nocturnal CPAP.  Hospital stay was complicated by SVT.  And acute on chronic decompensated heart failure with chronic kidney disease.  Patient improved with aggressive diuresis.  2D echo showed EF at 60 to 65%, grade 2 diastolic dysfunction.  Patient says overall breathing is doing okay.  He gets short of breath with prolonged walking.  But is still remains very active.  Says he is able to do yard work including mowing and weed eating.  Has to rest frequently.  He uses oxygen 2 L throughout the day.  And with his CPAP at bedtime.  Patient says he is unsure which neb medicines he is using previously was on DuoNeb twice daily.  We contacted his pharmacy which says that he has not filled DuoNeb in greater than 6 months.  Did get Rosalyn Gess once but no further refills since December.  Most recently had albuterol nebulizer.  We discussed setting up for a maintenance regimen.  Patient says he cannot afford inhalers.  We discussed restarting DuoNeb on a routine basis..  Patient has underlying sleep apnea.  Is on nocturnal CPAP with O2 2l/m . Marland Kitchen  Patient says he wears a CPAP every single night.  Feels that he benefits from CPAP with decreased daytime sleepiness.  CPAP download shows excellent compliance with 100% usage.  Daily average usage is 7.5 hours.  Patient is on auto CPAP 5 to 20 cm H2O.  Daily average pressure 11.1 cm H2O.,  AHI 0.3/hour. Uses [facility] .   Patient had a follow-up serial CT for lung nodule.  CT chest January 31, 2023 showed new groundglass nodule in the right upper lobe measuring 2.0 x 1.0 cm, unchanged 1.8 x 0.7 cm left lower lobe nodule.  Scattered solid pulmonary  nodules are stable.  We discussed getting a follow-up CT in 6 weeks.  Patient denies any hemoptysis or unintentional weight loss.      No Known Allergies  Immunization History  Administered Date(s) Administered   Moderna Sars-Covid-2 Vaccination 11/12/2019, 12/13/2019    Past Medical History:  Diagnosis Date   Anemia    Borderline diabetic    Chronic kidney disease    Diabetes mellitus without complication (HCC)    Duodenal adenoma    2021   GERD (gastroesophageal reflux disease)    Gout    Gouty arthritis    H/O small bowel obstruction    History of back surgery    Hx of adenomatous colonic polyps    2021   Hyperlipidemia    Hypertension    PE (pulmonary embolism)    Sleep apnea     Tobacco History: Social History   Tobacco Use  Smoking Status Former   Packs/day: 1.00   Years: 38.00   Additional pack years: 0.00   Total pack years: 38.00   Types: Cigarettes   Quit date: 02/07/1995   Years since quitting: 28.1  Smokeless Tobacco Never   Counseling given: Not Answered   Outpatient Medications Prior to Visit  Medication Sig Dispense Refill   albuterol (VENTOLIN HFA) 108 (90 Base) MCG/ACT inhaler Inhale 2 puffs into the lungs every 4 (four) hours as needed.     allopurinol (ZYLOPRIM) 100 MG tablet Take 1 tablet (100 mg total) by mouth daily. 90 tablet 1   amLODipine (NORVASC) 5 MG tablet Take 5 mg by mouth daily.     apixaban (ELIQUIS) 5 MG TABS tablet Take 2 tablets (10 mg total) by mouth 2 (two) times daily. On 08/27/22, start 5 mg  (1 tab) two times daily 74 tablet 1   atorvastatin (LIPITOR) 40 MG tablet Take 1 tablet (40 mg total) by mouth daily. 90 tablet 1   bisoprolol (ZEBETA) 5 MG tablet Take 1 tablet (5 mg total) by mouth daily. 90 tablet 3   epoetin alfa (EPOGEN) 3000 UNIT/ML injection Inject into the skin.     epoetin alfa-epbx (RETACRIT) 3000 UNIT/ML injection Inject 3,000 Units into the vein every 14 (fourteen) days.     FEROSUL 325 (65 Fe) MG  tablet TAKE ONE TABLET BY MOUTH EVERY MORNING WITH BREAKFAST 90 tablet 3   furosemide (LASIX) 40 MG tablet Take 1 tablet (40 mg total) by mouth daily. (Patient taking differently: Take 2 tabs (80mg ) by mouth every morning & 1 tab (40mg ) every evening) 30 tablet 1   hydrALAZINE (APRESOLINE) 25 MG tablet Take 1 tablet (25 mg total) by mouth every 6 (six) hours. 90 tablet 1   Multiple  Vitamin (MULTIVITAMIN WITH MINERALS) TABS tablet Take 1 tablet by mouth daily.     pantoprazole (PROTONIX) 40 MG tablet Take 1 tablet (40 mg total) by mouth 2 (two) times daily before a meal. (Patient taking differently: Take 40 mg by mouth daily.) 60 tablet 12   potassium chloride (KLOR-CON) 10 MEQ tablet Take 10 mEq by mouth daily.     TRULICITY 0.75 MG/0.5ML SOPN Inject 0.75 mg into the skin every Friday.     ipratropium (ATROVENT) 0.02 % nebulizer solution Inhale into the lungs.     predniSONE (DELTASONE) 20 MG tablet Take 1 tablet (20 mg total) by mouth daily with breakfast. 3 tablet 0   budesonide (PULMICORT) 0.5 MG/2ML nebulizer solution Take 0.5 mg by nebulization 2 (two) times daily. (Patient not taking: Reported on 04/11/2023)     ipratropium-albuterol (DUONEB) 0.5-2.5 (3) MG/3ML SOLN Take 3 mLs by nebulization every 6 (six) hours. (Patient not taking: Reported on 04/11/2023) 360 mL 1   No facility-administered medications prior to visit.     Review of Systems:   Constitutional:   No  weight loss, night sweats,  Fevers, chills, + fatigue, or  lassitude.  HEENT:   No headaches,  Difficulty swallowing,  Tooth/dental problems, or  Sore throat,                No sneezing, itching, ear ache, nasal congestion, post nasal drip,   CV:  No chest pain,  Orthopnea, PND, swelling in lower extremities, anasarca, dizziness, palpitations, syncope.   GI  No heartburn, indigestion, abdominal pain, nausea, vomiting, diarrhea, change in bowel habits, loss of appetite, bloody stools.   Resp: No shortness of breath with  exertion or at rest.  No excess mucus, no productive cough,  No non-productive cough,  No coughing up of blood.  No change in color of mucus.  No wheezing.  No chest wall deformity  Skin: no rash or lesions.  GU: no dysuria, change in color of urine, no urgency or frequency.  No flank pain, no hematuria   MS:  No joint pain or swelling.  No decreased range of motion.  No back pain.    Physical Exam  BP (!) 158/71   Pulse 70   Ht 5\' 9"  (1.753 m)   Wt 216 lb (98 kg)   SpO2 91% Comment: 2L  BMI 31.90 kg/m   GEN: A/Ox3; pleasant , NAD, elderly on oxygen   HEENT:  [place]/AT,  EACs-clear, TMs-wnl, NOSE-clear, THROAT-clear, no lesions, no postnasal drip or exudate noted.   NECK:  Supple w/ fair ROM; no JVD; normal carotid impulses w/o bruits; no thyromegaly or nodules palpated; no lymphadenopathy.    RESP  Clear  P & A; w/o, wheezes/ rales/ or rhonchi. no accessory muscle use, no dullness to percussion  CARD:  RRR, no m/r/g, no peripheral edema, pulses intact, no cyanosis or clubbing.  GI:   Soft & nt; nml bowel sounds; no organomegaly or masses detected.   Musco: Warm bil, no deformities or joint swelling noted.   Neuro: alert, no focal deficits noted.    Skin: Warm, no lesions or rashes    Lab Results:  CBC   BMET     ProBNP No results found for: "PROBNP"  Imaging: No results found.      Latest Ref Rng & Units 07/07/2020   12:55 PM  PFT Results  FVC-Pre L 2.04   FVC-Predicted Pre % 57   FVC-Post L 2.36   FVC-Predicted Post %  65   Pre FEV1/FVC % % 79   Post FEV1/FCV % % 77   FEV1-Pre L 1.62   FEV1-Predicted Pre % 61   FEV1-Post L 1.81   DLCO uncorrected ml/min/mmHg 9.61   DLCO UNC% % 39   DLVA Predicted % 58   TLC L 4.62   TLC % Predicted % 67   RV % Predicted % 98     No results found for: "NITRICOXIDE"      Assessment & Plan:   COPD (chronic obstructive pulmonary disease) (HCC) COPD-mixed type-last exacerbation was December 2023 requiring  hospitalization.  Needs to be on a maintenance inhaler/nebulizer.  For now we will restart DuoNeb twice daily.  If continues to have recurrent flares can look into cost of adding Brovana, budesonide and Yupelri.   Plan  Patient Instructions  Restart Duoneb Twice daily.  May use Albuterol inhaler or Duoneb As needed   Continue on CPAP At bedtime with O2 at 2l/m , wear all night long .  Continue on 2l/m Oxygen .  Continue on Eliquis .  Report any bleeding. Avoid all NSAIDS- aleve, ibuprofen, advil, etc.  CT chest in 6 weeks .  Follow up with Dr. Craige Cotta  in 2 months and As needed           Chronic respiratory failure with hypoxia (HCC) Continue on oxygen 2 L to maintain O2 saturations greater than 88 to 9%  OSA (obstructive sleep apnea) Excellent control and compliance on nocturnal CPAP continue on current settings.  Patient education given on CPAP care  Plan  Patient Instructions  Restart Duoneb Twice daily.  May use Albuterol inhaler or Duoneb As needed   Continue on CPAP At bedtime with O2 at 2l/m , wear all night long .  Continue on 2l/m Oxygen .  Continue on Eliquis .  Report any bleeding. Avoid all NSAIDS- aleve, ibuprofen, advil, etc.  CT chest in 6 weeks .  Follow up with Dr. 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  Continue on Eliquis.  Patient education given on anticoagulation therapy.  Plan  Patient Instructions  Restart Duoneb Twice daily.  May use Albuterol inhaler or Duoneb As needed   Continue on CPAP At bedtime with O2 at 2l/m , wear all night long .  Continue on 2l/m Oxygen .  Continue on Eliquis .  Report any bleeding. Avoid all NSAIDS- aleve, ibuprofen, advil, etc.  CT chest in 6 weeks .  Follow up with Dr. Craige Cotta  in 2 months and As needed           Chronic diastolic CHF (congestive heart failure) (HCC) Appears euvolemic on exam.  Continue current  regimen.  CKD (chronic kidney disease), stage IV (HCC) Continue follow-up with nephrology.  Lung nodule CT chest January 31, 2023 showed new area of groundglass nodularity in the right upper lobe measuring 2.0 x 1.0 cm.  Stable left lower lobe nodule measuring 1.8 cm.  And scattered pulmonary nodules stable.  Will recheck CT chest in 6 weeks for 28-month interval.  May need to consider PET scan going forward.     Rubye Oaks, NP 04/11/2023

## 2023-04-11 NOTE — Assessment & Plan Note (Signed)
Continue follow-up with nephrology. 

## 2023-04-11 NOTE — Assessment & Plan Note (Signed)
Appears euvolemic on exam.  Continue current regimen 

## 2023-04-11 NOTE — Progress Notes (Signed)
Reviewed and agree with assessment/plan.   Coralyn Helling, MD [facility] [place] [facility] 04/11/2023, 12:29 PM Pager:  [phone]

## 2023-04-11 NOTE — Assessment & Plan Note (Signed)
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  Continue on Eliquis.  Patient education given on anticoagulation therapy.  Plan  Patient Instructions  Restart Duoneb Twice daily.  May use Albuterol inhaler or Duoneb As needed   Continue on CPAP At bedtime with O2 at 2l/m , wear all night long .  Continue on 2l/m Oxygen .  Continue on Eliquis .  Report any bleeding. Avoid all NSAIDS- aleve, ibuprofen, advil, etc.  CT chest in 6 weeks .  Follow up with Dr. Craige Cotta  in 2 months and As needed

## 2023-04-11 NOTE — Assessment & Plan Note (Signed)
Excellent control and compliance on nocturnal CPAP continue on current settings.  Patient education given on CPAP care  Plan  Patient Instructions  Restart Duoneb Twice daily.  May use Albuterol inhaler or Duoneb As needed   Continue on CPAP At bedtime with O2 at 2l/m , wear all night long .  Continue on 2l/m Oxygen .  Continue on Eliquis .  Report any bleeding. Avoid all NSAIDS- aleve, ibuprofen, advil, etc.  CT chest in 6 weeks .  Follow up with Dr. Craige Cotta  in 2 months and As needed

## 2023-04-14 LAB — UPEP/UIFE/LIGHT CHAINS/TP, 24-HR UR
% BETA, Urine: 6.3 %
ALPHA 1 URINE: 2.7 %
Albumin, U: 83 %
Alpha 2, Urine: 3.2 %
Free Kappa Lt Chains,Ur: 28.47 mg/L (ref 1.17–86.46)
Free Kappa/Lambda Ratio: 4.67 (ref 1.83–14.26)
Free Lambda Lt Chains,Ur: 6.09 mg/L (ref 0.27–15.21)
GAMMA GLOBULIN URINE: 4.7 %
Total Protein, Urine-Ur/day: 1321 mg/24 hr — ABNORMAL HIGH (ref 30–150)
Total Protein, Urine: 56.2 mg/dL
Total Volume: 2350

## 2023-04-17 DIAGNOSIS — E119 Type 2 diabetes mellitus without complications: Secondary | ICD-10-CM | POA: Diagnosis not present

## 2023-04-18 ENCOUNTER — Encounter (HOSPITAL_COMMUNITY)
Admission: RE | Admit: 2023-04-18 | Discharge: 2023-04-18 | Disposition: A | Discharge status: Home / Self Care | Payer: Medicare HMO | Source: Ambulatory Visit | Attending: Nephrology | Admitting: Nephrology

## 2023-04-18 ENCOUNTER — Emergency Department (HOSPITAL_COMMUNITY): Payer: Medicare HMO

## 2023-04-18 ENCOUNTER — Observation Stay (HOSPITAL_COMMUNITY): Payer: Medicare HMO

## 2023-04-18 ENCOUNTER — Inpatient Hospital Stay (HOSPITAL_COMMUNITY)
Admission: EM | Admit: 2023-04-18 | Discharge: 2023-04-20 | DRG: 389 | Disposition: A | Discharge status: Home / Self Care | Payer: Medicare HMO | Attending: Family Medicine | Admitting: Family Medicine

## 2023-04-18 ENCOUNTER — Encounter (HOSPITAL_COMMUNITY): Payer: Self-pay | Admitting: *Deleted

## 2023-04-18 ENCOUNTER — Other Ambulatory Visit: Payer: Self-pay

## 2023-04-18 DIAGNOSIS — J449 Chronic obstructive pulmonary disease, unspecified: Secondary | ICD-10-CM | POA: Diagnosis not present

## 2023-04-18 DIAGNOSIS — F1010 Alcohol abuse, uncomplicated: Secondary | ICD-10-CM | POA: Diagnosis present

## 2023-04-18 DIAGNOSIS — I2699 Other pulmonary embolism without acute cor pulmonale: Secondary | ICD-10-CM | POA: Diagnosis present

## 2023-04-18 DIAGNOSIS — I2724 Chronic thromboembolic pulmonary hypertension: Secondary | ICD-10-CM | POA: Diagnosis present

## 2023-04-18 DIAGNOSIS — Z79899 Other long term (current) drug therapy: Secondary | ICD-10-CM

## 2023-04-18 DIAGNOSIS — K5730 Diverticulosis of large intestine without perforation or abscess without bleeding: Secondary | ICD-10-CM | POA: Diagnosis not present

## 2023-04-18 DIAGNOSIS — Z9842 Cataract extraction status, left eye: Secondary | ICD-10-CM

## 2023-04-18 DIAGNOSIS — K219 Gastro-esophageal reflux disease without esophagitis: Secondary | ICD-10-CM | POA: Diagnosis present

## 2023-04-18 DIAGNOSIS — Z86711 Personal history of pulmonary embolism: Secondary | ICD-10-CM

## 2023-04-18 DIAGNOSIS — K56600 Partial intestinal obstruction, unspecified as to cause: Secondary | ICD-10-CM | POA: Diagnosis not present

## 2023-04-18 DIAGNOSIS — I5032 Chronic diastolic (congestive) heart failure: Secondary | ICD-10-CM

## 2023-04-18 DIAGNOSIS — Z961 Presence of intraocular lens: Secondary | ICD-10-CM | POA: Diagnosis present

## 2023-04-18 DIAGNOSIS — N281 Cyst of kidney, acquired: Secondary | ICD-10-CM | POA: Diagnosis not present

## 2023-04-18 DIAGNOSIS — Z9841 Cataract extraction status, right eye: Secondary | ICD-10-CM

## 2023-04-18 DIAGNOSIS — Z86010 Personal history of colonic polyps: Secondary | ICD-10-CM

## 2023-04-18 DIAGNOSIS — Z9981 Dependence on supplemental oxygen: Secondary | ICD-10-CM

## 2023-04-18 DIAGNOSIS — R9431 Abnormal electrocardiogram [ECG] [EKG]: Secondary | ICD-10-CM | POA: Diagnosis not present

## 2023-04-18 DIAGNOSIS — I10 Essential (primary) hypertension: Secondary | ICD-10-CM | POA: Diagnosis not present

## 2023-04-18 DIAGNOSIS — Z452 Encounter for adjustment and management of vascular access device: Secondary | ICD-10-CM | POA: Diagnosis not present

## 2023-04-18 DIAGNOSIS — K56609 Unspecified intestinal obstruction, unspecified as to partial versus complete obstruction: Secondary | ICD-10-CM

## 2023-04-18 DIAGNOSIS — N184 Chronic kidney disease, stage 4 (severe): Secondary | ICD-10-CM | POA: Diagnosis not present

## 2023-04-18 DIAGNOSIS — E1122 Type 2 diabetes mellitus with diabetic chronic kidney disease: Secondary | ICD-10-CM | POA: Diagnosis present

## 2023-04-18 DIAGNOSIS — I130 Hypertensive heart and chronic kidney disease with heart failure and stage 1 through stage 4 chronic kidney disease, or unspecified chronic kidney disease: Secondary | ICD-10-CM | POA: Diagnosis present

## 2023-04-18 DIAGNOSIS — Z7901 Long term (current) use of anticoagulants: Secondary | ICD-10-CM

## 2023-04-18 DIAGNOSIS — E785 Hyperlipidemia, unspecified: Secondary | ICD-10-CM | POA: Diagnosis present

## 2023-04-18 DIAGNOSIS — J9611 Chronic respiratory failure with hypoxia: Secondary | ICD-10-CM | POA: Diagnosis present

## 2023-04-18 DIAGNOSIS — Z833 Family history of diabetes mellitus: Secondary | ICD-10-CM

## 2023-04-18 DIAGNOSIS — Z87891 Personal history of nicotine dependence: Secondary | ICD-10-CM

## 2023-04-18 DIAGNOSIS — R1084 Generalized abdominal pain: Secondary | ICD-10-CM | POA: Diagnosis not present

## 2023-04-18 DIAGNOSIS — Z8249 Family history of ischemic heart disease and other diseases of the circulatory system: Secondary | ICD-10-CM

## 2023-04-18 DIAGNOSIS — G4733 Obstructive sleep apnea (adult) (pediatric): Secondary | ICD-10-CM | POA: Diagnosis present

## 2023-04-18 LAB — COMPREHENSIVE METABOLIC PANEL
ALT: 14 U/L (ref 0–44)
AST: 17 U/L (ref 15–41)
Albumin: 3.8 g/dL (ref 3.5–5.0)
Alkaline Phosphatase: 44 U/L (ref 38–126)
Anion gap: 10 (ref 5–15)
BUN: 41 mg/dL — ABNORMAL HIGH (ref 8–23)
CO2: 28 mmol/L (ref 22–32)
Calcium: 8.7 mg/dL — ABNORMAL LOW (ref 8.9–10.3)
Chloride: 99 mmol/L (ref 98–111)
Creatinine, Ser: 2.75 mg/dL — ABNORMAL HIGH (ref 0.61–1.24)
GFR, Estimated: 23 mL/min — ABNORMAL LOW (ref >60)
Glucose, Bld: 109 mg/dL — ABNORMAL HIGH (ref 70–99)
Potassium: 3.7 mmol/L (ref 3.5–5.1)
Sodium: 137 mmol/L (ref 135–145)
Total Bilirubin: 0.9 mg/dL (ref 0.3–1.2)
Total Protein: 6.8 g/dL (ref 6.5–8.1)

## 2023-04-18 LAB — URINALYSIS, ROUTINE W REFLEX MICROSCOPIC
Bacteria, UA: NONE SEEN
Bilirubin Urine: NEGATIVE
Glucose, UA: NEGATIVE mg/dL
Hgb urine dipstick: NEGATIVE
Ketones, ur: NEGATIVE mg/dL
Leukocytes,Ua: NEGATIVE
Nitrite: NEGATIVE
Protein, ur: 100 mg/dL — AB
Specific Gravity, Urine: 1.013 (ref 1.005–1.030)
pH: 5 (ref 5.0–8.0)

## 2023-04-18 LAB — CBC WITH DIFFERENTIAL/PLATELET
Abs Immature Granulocytes: 0.01 10*3/uL (ref 0.00–0.07)
Basophils Absolute: 0 10*3/uL (ref 0.0–0.1)
Basophils Relative: 0 %
Eosinophils Absolute: 0.1 10*3/uL (ref 0.0–0.5)
Eosinophils Relative: 1 %
HCT: 30.4 % — ABNORMAL LOW (ref 39.0–52.0)
Hemoglobin: 10 g/dL — ABNORMAL LOW (ref 13.0–17.0)
Immature Granulocytes: 0 %
Lymphocytes Relative: 17 %
Lymphs Abs: 1.1 10*3/uL (ref 0.7–4.0)
MCH: 27.8 pg (ref 26.0–34.0)
MCHC: 32.9 g/dL (ref 30.0–36.0)
MCV: 84.4 fL (ref 80.0–100.0)
Monocytes Absolute: 0.5 10*3/uL (ref 0.1–1.0)
Monocytes Relative: 8 %
Neutro Abs: 4.8 10*3/uL (ref 1.7–7.7)
Neutrophils Relative %: 74 %
Platelets: 172 10*3/uL (ref 150–400)
RBC: 3.6 MIL/uL — ABNORMAL LOW (ref 4.22–5.81)
RDW: 13.5 % (ref 11.5–15.5)
WBC: 6.4 10*3/uL (ref 4.0–10.5)
nRBC: 0 % (ref 0.0–0.2)

## 2023-04-18 LAB — GLUCOSE, CAPILLARY: Glucose-Capillary: 103 mg/dL — ABNORMAL HIGH (ref 70–99)

## 2023-04-18 LAB — LIPASE, BLOOD: Lipase: 37 U/L (ref 11–51)

## 2023-04-18 MED ORDER — ACETAMINOPHEN 650 MG RE SUPP: 650.0000 mg | Freq: Four times a day (QID) | RECTAL | Status: DC | PRN

## 2023-04-18 MED ORDER — MORPHINE SULFATE (PF) 2 MG/ML IV SOLN
2.0000 mg | INTRAVENOUS | Status: DC | PRN
  Administered 2023-04-18 – 2023-04-19 (×3): 2 mg via INTRAVENOUS
  Filled 2023-04-18 (×3): qty 1

## 2023-04-18 MED ORDER — ONDANSETRON HCL 4 MG/2ML IJ SOLN
4.0000 mg | Freq: Three times a day (TID) | INTRAMUSCULAR | Status: DC | PRN
  Administered 2023-04-19: 4 mg via INTRAVENOUS
  Filled 2023-04-18: qty 2

## 2023-04-18 MED ORDER — INSULIN ASPART 100 UNIT/ML IJ SOLN: 0.0000 [IU] | Freq: Three times a day (TID) | INTRAMUSCULAR | Status: DC

## 2023-04-18 MED ORDER — SODIUM CHLORIDE 0.9 % IV SOLN: INTRAVENOUS | Status: DC

## 2023-04-18 MED ORDER — ACETAMINOPHEN 325 MG PO TABS: 650.0000 mg | ORAL_TABLET | Freq: Four times a day (QID) | ORAL | Status: DC | PRN

## 2023-04-18 MED ORDER — ONDANSETRON HCL 4 MG PO TABS: 4.0000 mg | ORAL_TABLET | Freq: Three times a day (TID) | ORAL | Status: DC | PRN

## 2023-04-18 MED ORDER — HYDRALAZINE HCL 20 MG/ML IJ SOLN: 10.0000 mg | INTRAMUSCULAR | Status: DC | PRN

## 2023-04-18 MED ORDER — FENTANYL CITRATE PF 50 MCG/ML IJ SOSY
50.0000 ug | PREFILLED_SYRINGE | Freq: Once | INTRAMUSCULAR | Status: AC
  Administered 2023-04-18: 50 ug via INTRAVENOUS
  Filled 2023-04-18: qty 1

## 2023-04-18 MED ORDER — HEPARIN SODIUM (PORCINE) 5000 UNIT/ML IJ SOLN
5000.0000 [IU] | Freq: Three times a day (TID) | INTRAMUSCULAR | Status: DC
  Administered 2023-04-18 – 2023-04-19 (×2): 5000 [IU] via SUBCUTANEOUS
  Filled 2023-04-18 (×2): qty 1

## 2023-04-18 MED ORDER — POTASSIUM CHLORIDE IN NACL 20-0.9 MEQ/L-% IV SOLN: INTRAVENOUS | Status: DC

## 2023-04-18 MED ORDER — ONDANSETRON HCL 4 MG/2ML IJ SOLN
4.0000 mg | Freq: Once | INTRAMUSCULAR | Status: AC
  Administered 2023-04-18: 4 mg via INTRAVENOUS
  Filled 2023-04-18: qty 2

## 2023-04-18 MED ORDER — POLYETHYLENE GLYCOL 3350 17 G PO PACK: 17.0000 g | PACK | Freq: Every day | ORAL | Status: DC | PRN

## 2023-04-18 NOTE — Assessment & Plan Note (Signed)
Creatinine 2.75.  At baseline. -Hold Lasix, hydrate

## 2023-04-18 NOTE — ED Notes (Signed)
RN certified in [place] to look for an ultrasound IV

## 2023-04-18 NOTE — Assessment & Plan Note (Addendum)
Abd pain, bloating and distension, vomiting. Last BM was last night  History of bowel perforation, abd surgery.  CT abdomen and pelvis without contrast- There is marked dilation of proximal small bowel loops and decompression of distal small bowel loops consistent with high-grade partial small bowel obstruction, possibly caused by ideations. Zone of transition is noted in right lower quadrant. - NG tube - EDP talked to Dr. Henreitta Leber, will see in consult in a.m. -N.p.o.  -N/s + 20 KCL 75cc/hr  - IV morphine. -Hold Eliquis

## 2023-04-18 NOTE — H&P (Signed)
History and Physical    Andrew Adams WJX:[id number] DOB: 09-23-1946 DOA: 04/18/2023  PCP: Lovey Newcomer, PA   Patient coming from: Home   I have personally briefly reviewed patient's old medical records in [facility]  Chief Complaint: Abdominal Pain  HPI: Andrew Adams is a 77 y.o. male with medical history significant for CKD4, diastolic CHF, diabetes mellitus, COPD, hypertension, pulmonary embolism. Patient presented to the ED with complaints of sudden onset abdominal pain that started about 30 minutes after eating dinner last night at about 6 PM.  Reports last bowel movements was about 10 PM yesterday.  Reports 1 episode of vomiting since onset.  Reports abdominal bloating and tightness. Has had history of bowel perforation and bowel surgery.  ED Course: Tmax 99.3.  Heart rate 60s 70s.  Respiratory rate 16-26.  Blood pressure systolic 140s to 213Y.  Creatinine 2.75 at baseline.  CT abdomen and pelvis Wo contrast- There is marked dilation of proximal small bowel loops and decompression of distal small bowel loops consistent with high-grade partial small bowel obstruction, possibly caused by ideations. Zone of transition is noted in right lower quadrant. - EDP Dr. Denton Meek surgery, hospitalist admit, will see in consult.  Review of Systems: As per HPI all other systems reviewed and negative.  Past Medical History:  Diagnosis Date   Anemia    Borderline diabetic    Chronic kidney disease    Diabetes mellitus without complication (HCC)    Duodenal adenoma    2021   GERD (gastroesophageal reflux disease)    Gout    Gouty arthritis    H/O small bowel obstruction    History of back surgery    Hx of adenomatous colonic polyps    2021   Hyperlipidemia    Hypertension    PE (pulmonary embolism)    Sleep apnea     Past Surgical History:  Procedure Laterality Date   ABDOMINAL SURGERY  2103   Smal bowel obstruction    ABDOMINAL SURGERY  1981   Perforated large  intestine   back     Lower back   BIOPSY  08/28/2020   Procedure: BIOPSY;  Surgeon: Dolores Frame, MD;  [facility];  Service: Gastroenterology;;   BIOPSY  02/07/2022   Procedure: BIOPSY;  Surgeon: Lemar Lofty., MD;  Location: [facility];  Service: Gastroenterology;;   CATARACT EXTRACTION W/PHACO Right 01/06/2020   Procedure: CATARACT EXTRACTION PHACO AND INTRAOCULAR LENS PLACEMENT (IOC) (CDE: 6.48);  Surgeon: Fabio Pierce, MD;  Location: [facility];  Service: Ophthalmology;  Laterality: Right;   CATARACT EXTRACTION W/PHACO Left 01/20/2020   Procedure: CATARACT EXTRACTION PHACO AND INTRAOCULAR LENS PLACEMENT (IOC);  Surgeon: Fabio Pierce, MD;  Location: [facility];  Service: Ophthalmology;  Laterality: Left;  CDE: 5.86   COLONOSCOPY WITH PROPOFOL N/A 08/28/2020   Procedure: COLONOSCOPY WITH PROPOFOL;  Surgeon: Dolores Frame, MD;  [facility];  Service: Gastroenterology;  Laterality: N/A;  230   ENDOSCOPIC MUCOSAL RESECTION N/A 02/07/2022   Procedure: ENDOSCOPIC MUCOSAL RESECTION;  Surgeon: Meridee Score Netty Starring., MD;  Location: [facility];  Service: Gastroenterology;  Laterality: N/A;   ESOPHAGOGASTRODUODENOSCOPY N/A 02/07/2022   Procedure: ESOPHAGOGASTRODUODENOSCOPY (EGD);  Surgeon: Lemar Lofty., MD;  Location: [facility];  Service: Gastroenterology;  Laterality: N/A;   ESOPHAGOGASTRODUODENOSCOPY (EGD) WITH PROPOFOL N/A 08/28/2020   Procedure: ESOPHAGOGASTRODUODENOSCOPY (EGD) WITH PROPOFOL;  Surgeon: Dolores Frame, MD;  [facility];  Service: Gastroenterology;  Laterality: N/A;  ESOPHAGOGASTRODUODENOSCOPY (EGD) WITH PROPOFOL N/A 12/14/2020   Procedure: ESOPHAGOGASTRODUODENOSCOPY (EGD) WITH PROPOFOL;  Surgeon: Meridee Score Netty Starring., MD;  Location: [facility];  Service: Gastroenterology;  Laterality: N/A;   GIVENS CAPSULE STUDY N/A 09/10/2020   Procedure: GIVENS CAPSULE STUDY;  Surgeon: Dolores Frame, MD;  [facility];  Service: Gastroenterology;  Laterality: N/A;  730   HEMOSTASIS CLIP PLACEMENT  12/14/2020   Procedure: HEMOSTASIS CLIP PLACEMENT;  Surgeon: Lemar Lofty., MD;  Location: [facility];  Service: Gastroenterology;;   HEMOSTASIS CLIP PLACEMENT  02/07/2022   Procedure: HEMOSTASIS CLIP PLACEMENT;  Surgeon: Lemar Lofty., MD;  Location: [facility];  Service: Gastroenterology;;   HEMOSTASIS CONTROL  12/14/2020   Procedure: HEMOSTASIS CONTROL;  Surgeon: Lemar Lofty., MD;  Location: [facility];  Service: Gastroenterology;;   HERNIA REPAIR     incisional   HOT HEMOSTASIS N/A 02/07/2022   Procedure: HOT HEMOSTASIS (ARGON PLASMA COAGULATION/BICAP);  Surgeon: Lemar Lofty., MD;  Location: [facility];  Service: Gastroenterology;  Laterality: N/A;   POLYPECTOMY  08/28/2020   Procedure: POLYPECTOMY;  Surgeon: Dolores Frame, MD;  [facility];  Service: Gastroenterology;;   POLYPECTOMY  12/14/2020   Procedure: POLYPECTOMY;  Surgeon: Lemar Lofty., MD;  Location: [facility];  Service: Gastroenterology;;   Susa Day  12/14/2020   Procedure: Susa Day;  Surgeon: Mansouraty, Netty Starring., MD;  Location: [facility];  Service: Gastroenterology;;   Brooke Dare INJECTION  02/07/2022   Procedure: SUBMUCOSAL LIFTING INJECTION;  Surgeon: Lemar Lofty., MD;  Location: [facility];  Service: Gastroenterology;;   SUBMUCOSAL TATTOO INJECTION  12/14/2020   Procedure: SUBMUCOSAL TATTOO INJECTION;  Surgeon: Lemar Lofty., MD;  Location: [facility];  Service: Gastroenterology;;     reports that he quit smoking about 28 years ago. His smoking use included cigarettes. He has a 38.00 pack-year smoking history. He has never used smokeless tobacco. He reports current alcohol use of about 2.0 standard drinks of alcohol per week. He reports current drug use. Drug:  Marijuana.  No Known Allergies  Family History  Problem Relation Age of Onset   Hypertension Sister    Diabetes Sister    Diabetes Sister    Hypertension Mother    Diabetes Mother    Hypertension Father    Ulcers Father    Colon cancer Neg Hx    Esophageal cancer Neg Hx    Stomach cancer Neg Hx    Inflammatory bowel disease Neg Hx    Liver disease Neg Hx    Pancreatic cancer Neg Hx    Rectal cancer Neg Hx     Prior to Admission medications   Medication Sig Start Date End Date Taking? Authorizing Provider  albuterol (VENTOLIN HFA) 108 (90 Base) MCG/ACT inhaler Inhale 2 puffs into the lungs every 4 (four) hours as needed. 11/08/22   [provider]  allopurinol (ZYLOPRIM) 100 MG tablet Take 1 tablet (100 mg total) by mouth daily. 02/24/14   Daphine Deutscher, Mary-Margaret, FNP  amLODipine (NORVASC) 5 MG tablet Take 5 mg by mouth daily.    [provider]  apixaban (ELIQUIS) 5 MG TABS tablet Take 2 tablets (10 mg total) by mouth 2 (two) times daily. On 08/27/22, start 5 mg  (1 tab) two times daily 08/20/22   Tat, Onalee Hua, MD  atorvastatin (LIPITOR) 40 MG tablet Take 1 tablet (40 mg total) by mouth daily. 02/24/14   Daphine Deutscher, Mary-Margaret, FNP  bisoprolol (ZEBETA) 5 MG tablet Take 1 tablet (5 mg total)  by mouth daily. 01/23/23   Mallipeddi, Vishnu P, MD  epoetin alfa (EPOGEN) 3000 UNIT/ML injection Inject into the skin. 02/02/23   [provider]  epoetin alfa-epbx (RETACRIT) 3000 UNIT/ML injection Inject 3,000 Units into the vein every 14 (fourteen) days.    [provider]  FEROSUL 325 (65 Fe) MG tablet TAKE ONE TABLET BY MOUTH EVERY MORNING WITH BREAKFAST 12/20/21   Carlan, Chelsea L, NP  furosemide (LASIX) 40 MG tablet Take 1 tablet (40 mg total) by mouth daily. Patient taking differently: Take 2 tabs (80mg ) by mouth every morning & 1 tab (40mg ) every evening 10/30/22   Tat, Onalee Hua, MD  hydrALAZINE (APRESOLINE) 25 MG tablet Take 1 tablet (25 mg total) by mouth every 6  (six) hours. 10/30/22   Catarina Hartshorn, MD  ipratropium-albuterol (DUONEB) 0.5-2.5 (3) MG/3ML SOLN Take 3 mLs by nebulization every 6 (six) hours as needed. 04/11/23   Parrett, Virgel Bouquet, NP  Multiple Vitamin (MULTIVITAMIN WITH MINERALS) TABS tablet Take 1 tablet by mouth daily. 02/16/20   Johnson, Clanford L, MD  pantoprazole (PROTONIX) 40 MG tablet Take 1 tablet (40 mg total) by mouth 2 (two) times daily before a meal. Patient taking differently: Take 40 mg by mouth daily. 02/07/22   Mansouraty, Netty Starring., MD  potassium chloride (KLOR-CON) 10 MEQ tablet Take 10 mEq by mouth daily. 07/18/22   [provider]  TRULICITY 0.75 MG/0.5ML SOPN Inject 0.75 mg into the skin every Friday.    [provider]    Physical Exam: Vitals:   04/18/23 1645 04/18/23 1700 04/18/23 1745 04/18/23 1825  BP: (!) 164/88 (!) 154/100 (!) 155/94 (!) 159/96  Pulse: 75 76 74 76  Resp: (!) 22 (!) 26 (!) 25 (!) 21  Temp:   98.8 F (37.1 C) 99.3 F (37.4 C)  TempSrc:   Oral Oral  SpO2: 96% 99% 95% 100%  Weight:    96.7 kg  Height:    5\' 9"  (1.753 m)    Constitutional: NAD, calm, comfortable Vitals:   04/18/23 1645 04/18/23 1700 04/18/23 1745 04/18/23 1825  BP: (!) 164/88 (!) 154/100 (!) 155/94 (!) 159/96  Pulse: 75 76 74 76  Resp: (!) 22 (!) 26 (!) 25 (!) 21  Temp:   98.8 F (37.1 C) 99.3 F (37.4 C)  TempSrc:   Oral Oral  SpO2: 96% 99% 95% 100%  Weight:    96.7 kg  Height:    5\' 9"  (1.753 m)   Eyes: PERRL, lids and conjunctivae normal ENMT: Mucous membranes are moist.   Neck: normal, supple, no masses, no thyromegaly Respiratory: clear to auscultation bilaterally, no wheezing, no crackles. Normal respiratory effort. No accessory muscle use.  Cardiovascular: Regular rate and rhythm, no murmurs / rubs / gallops. No extremity edema. 2+ pedal pulses. No carotid bruits.  Abdomen: Distended, tight, mild diffuse tenderness, no masses palpated. No hepatosplenomegaly.  Musculoskeletal: no clubbing /  cyanosis. No joint deformity upper and lower extremities.  Skin: no rashes, lesions, ulcers. No induration Neurologic: No facial assymmetry, moving extremities spontaneously. Psychiatric: Normal judgment and insight. Alert and oriented x 3. Normal mood.   Labs on Admission: I have personally reviewed following labs and imaging studies  CBC: Recent Labs  Lab 04/18/23 0847  WBC 6.4  NEUTROABS 4.8  HGB 10.0*  HCT 30.4*  MCV 84.4  PLT 172   Basic Metabolic Panel: Recent Labs  Lab 04/18/23 0847  NA 137  K 3.7  CL 99  CO2 28  GLUCOSE  109*  BUN 41*  CREATININE 2.75*  CALCIUM 8.7*   GFR: Estimated Creatinine Clearance: 25 mL/min (A) (by C-G formula based on SCr of 2.75 mg/dL (H)). Liver Function Tests: Recent Labs  Lab 04/18/23 0847  AST 17  ALT 14  ALKPHOS 44  BILITOT 0.9  PROT 6.8  ALBUMIN 3.8   Recent Labs  Lab 04/18/23 0847  LIPASE 37   Urine analysis:    Component Value Date/Time   COLORURINE YELLOW 08/18/2022 1116   APPEARANCEUR CLEAR 08/18/2022 1116   LABSPEC 1.009 08/18/2022 1116   PHURINE 6.0 08/18/2022 1116   GLUCOSEU NEGATIVE 08/18/2022 1116   HGBUR NEGATIVE 08/18/2022 1116   BILIRUBINUR NEGATIVE 08/18/2022 1116   BILIRUBINUR negative 01/20/2014 1032   KETONESUR NEGATIVE 08/18/2022 1116   PROTEINUR 100 (A) 08/18/2022 1116   UROBILINOGEN negative 01/20/2014 1032   NITRITE NEGATIVE 08/18/2022 1116   LEUKOCYTESUR TRACE (A) 08/18/2022 1116    Radiological Exams on Admission: CT ABDOMEN PELVIS WO CONTRAST  Result Date: 04/18/2023 CLINICAL DATA:  Abdominal pain EXAM: CT ABDOMEN AND PELVIS WITHOUT CONTRAST TECHNIQUE: Multidetector CT imaging of the abdomen and pelvis was performed following the standard protocol without IV contrast. RADIATION DOSE REDUCTION: This exam was performed according to the departmental dose-optimization program which includes automated exposure control, adjustment of the mA and/or kV according to patient size and/or use of  iterative reconstruction technique. COMPARISON:  10/17/2021 FINDINGS: Lower chest: There are few linear and pleural-based densities in the lower lung fields with no interval change. There is 6 mm subpleural nodule in right lower lobe with no interval change. Coronary artery calcifications are seen. Hepatobiliary: No focal abnormalities are seen in liver. There is no dilation of bile ducts. Gallbladder is unremarkable. There is 2 mm calcific density in the lateral margin of gallbladder. There is no wall thickening or pericholecystic fluid collection. Pancreas: No focal abnormalities are seen. Spleen: Unremarkable. Adrenals/Urinary Tract: Adrenals are unremarkable. There is no hydronephrosis. There are few smooth myelinated lesions in both kidneys, more so on the right side suggesting renal cysts with no significant interval change. Urinary bladder is not distended. Stomach/Bowel: Small hiatal hernia is seen. There is abnormal dilation of small bowel loops measuring up to 5.1 cm in maximum diameter. There is fecalization of a small bowel loop in right lower abdomen. Zone of transition appears to be in right lower quadrant. There is no demonstrable soft tissue mass in the region of the transition. Multiple diverticula are seen in colon without signs of focal acute diverticulitis. Appendix is not visualized. Vascular/Lymphatic: Scattered calcifications are seen in aorta and its major branches. Reproductive: Prostate is enlarged. Other: There is no ascites or pneumoperitoneum. There is evidence of previous ventral hernia repair. Small bilateral inguinal hernias containing fat are noted. Musculoskeletal: Degenerative changes are noted in lumbar spine, more so at L4-L5 and L5-S1 levels. IMPRESSION: There is marked dilation of proximal small bowel loops and decompression of distal small bowel loops consistent with high-grade partial small bowel obstruction, possibly caused by ideations. Zone of transition is noted in right  lower quadrant. There is no hydronephrosis. Diverticulosis of colon without signs of focal diverticulitis. There is 2 mm calcific density in the margin of the gallbladder which may suggest gallbladder stone adherent to the mucosa are calcified granuloma in the wall of the gallbladder. Aortic arteriosclerosis. Coronary artery disease. Small hiatal hernia. Renal cysts. Lumbar spondylosis. Other findings as described in the body of the report. Electronically Signed   By: Harlan Stains.D.  On: 04/18/2023 15:46    EKG: Independently reviewed.  Sinus rhythm, rate 70, QTc 447.  No significant change from prior.  Assessment/Plan Principal Problem:   Partial small bowel obstruction (HCC) Active Problems:   CKD (chronic kidney disease), stage IV (HCC)   Essential hypertension   Chronic alcohol abuse   Chronic respiratory failure with hypoxia (HCC)   OSA (obstructive sleep apnea)   COPD (chronic obstructive pulmonary disease) (HCC)   Pulmonary embolism (HCC)   Chronic diastolic CHF (congestive heart failure) (HCC)   Assessment and Plan: * Partial small bowel obstruction (HCC) Abd pain, bloating and distension, vomiting. Last BM was last night  History of bowel perforation, abd surgery.  CT abdomen and pelvis without contrast- There is marked dilation of proximal small bowel loops and decompression of distal small bowel loops consistent with high-grade partial small bowel obstruction, possibly caused by ideations. Zone of transition is noted in right lower quadrant. - NG tube - EDP talked to Dr. Henreitta Leber, will see in consult in a.m. -N.p.o.  -N/s + 20 KCL 75cc/hr  - IV morphine. -Hold Eliquis  CKD (chronic kidney disease), stage IV (HCC) Creatinine 2.75.  At baseline. -Hold Lasix, hydrate  Chronic diastolic CHF (congestive heart failure) (HCC) Stable and compensated.  Last echo 10 /2023- EF of 60 to 65%, grade 2 DD. -Hold Lasix, bisoprolol  Pulmonary embolism (HCC) On Eliquis.  Last  dose was yesterday evening. -Hold Eliquis  COPD (chronic obstructive pulmonary disease) (HCC) With chronic respiratory failure on 2 L.  Stable, no wheezing or dyspnea.  OSA (obstructive sleep apnea) CPAP  Essential hypertension Systolic 140s to 119J -Hold home bisoprolol, hydralazine, Norvasc while n.p.o. -IV hydralazine 10mg  for systolic > 170   DVT prophylaxis: Heparin Code Status: FULL Code-confirmed with patient and spouse at bedside Family Communication: Spouse at bedside Disposition Plan: ~ 2 days Consults called: Gen Surg Admission status:  Obs tele    Author: Onnie Boer, MD 04/18/2023 8:18 PM  For on call review [email or web address].

## 2023-04-18 NOTE — Progress Notes (Signed)
Patient unable to use CPAP this evening due having a NG tube placed. He is on 2lpm [place] at this time and resting comfortably. RT informed patient a unit will be mae available when the NG is removed to resume his use of a CPAP.

## 2023-04-18 NOTE — Assessment & Plan Note (Signed)
On Eliquis.  Last dose was yesterday evening. -Hold Eliquis

## 2023-04-18 NOTE — ED Provider Notes (Signed)
Andrew Adams EMERGENCY DEPARTMENT AT [facility] Provider Note   CSN: [id number] Arrival date & time: 04/18/23  0719     History  Chief Complaint  Patient presents with   Abdominal Pain    Andrew Adams is a 77 y.o. male.  HPI Presents with abdominal pain, nausea.  History is notable for multiple abdominal surgeries including prior ruptured bowel according to the patient. However, Andrew Adams was well until yesterday.  After eating dinner Andrew Adams developed abdominal pain, diffuse, severe, with associated anorexia, nausea and has had multiple episodes of vomiting. Andrew Adams has had 1 bowel movement since onset. No fever, no chest pain, no dyspnea.     Home Medications Prior to Admission medications   Medication Sig Start Date End Date Taking? Authorizing Provider  albuterol (VENTOLIN HFA) 108 (90 Base) MCG/ACT inhaler Inhale 2 puffs into the lungs every 4 (four) hours as needed. 11/08/22   [provider]  allopurinol (ZYLOPRIM) 100 MG tablet Take 1 tablet (100 mg total) by mouth daily. 02/24/14   Daphine Deutscher, Mary-Margaret, FNP  amLODipine (NORVASC) 5 MG tablet Take 5 mg by mouth daily.    [provider]  apixaban (ELIQUIS) 5 MG TABS tablet Take 2 tablets (10 mg total) by mouth 2 (two) times daily. On 08/27/22, start 5 mg  (1 tab) two times daily 08/20/22   Tat, Onalee Hua, MD  atorvastatin (LIPITOR) 40 MG tablet Take 1 tablet (40 mg total) by mouth daily. 02/24/14   Daphine Deutscher, Mary-Margaret, FNP  bisoprolol (ZEBETA) 5 MG tablet Take 1 tablet (5 mg total) by mouth daily. 01/23/23   Mallipeddi, Vishnu P, MD  epoetin alfa (EPOGEN) 3000 UNIT/ML injection Inject into the skin. 02/02/23   [provider]  epoetin alfa-epbx (RETACRIT) 3000 UNIT/ML injection Inject 3,000 Units into the vein every 14 (fourteen) days.    [provider]  FEROSUL 325 (65 Fe) MG tablet TAKE ONE TABLET BY MOUTH EVERY MORNING WITH BREAKFAST 12/20/21   Carlan, Chelsea L, NP  furosemide (LASIX) 40 MG  tablet Take 1 tablet (40 mg total) by mouth daily. Patient taking differently: Take 2 tabs (80mg ) by mouth every morning & 1 tab (40mg ) every evening 10/30/22   Tat, Onalee Hua, MD  hydrALAZINE (APRESOLINE) 25 MG tablet Take 1 tablet (25 mg total) by mouth every 6 (six) hours. 10/30/22   Catarina Hartshorn, MD  ipratropium-albuterol (DUONEB) 0.5-2.5 (3) MG/3ML SOLN Take 3 mLs by nebulization every 6 (six) hours as needed. 04/11/23   Parrett, Virgel Bouquet, NP  Multiple Vitamin (MULTIVITAMIN WITH MINERALS) TABS tablet Take 1 tablet by mouth daily. 02/16/20   Johnson, Clanford L, MD  pantoprazole (PROTONIX) 40 MG tablet Take 1 tablet (40 mg total) by mouth 2 (two) times daily before a meal. Patient taking differently: Take 40 mg by mouth daily. 02/07/22   Mansouraty, Netty Starring., MD  potassium chloride (KLOR-CON) 10 MEQ tablet Take 10 mEq by mouth daily. 07/18/22   [provider]  TRULICITY 0.75 MG/0.5ML SOPN Inject 0.75 mg into the skin every Friday.    [provider]      Allergies    Patient has no known allergies.    Review of Systems   Review of Systems  All other systems reviewed and are negative.   Physical Exam Updated Vital Signs BP (!) 148/90   Pulse 72   Temp 98.2 F (36.8 C) (Oral)   Resp 19   Ht 5\' 9"  (1.753 m)   Wt 97 kg   SpO2  96%   BMI 31.58 kg/m  Physical Exam Vitals and nursing note reviewed.  Constitutional:      General: Andrew Adams is not in acute distress.    Appearance: Andrew Adams is well-developed.  HENT:     Head: Normocephalic and atraumatic.  Eyes:     Conjunctiva/sclera: Conjunctivae normal.  Cardiovascular:     Rate and Rhythm: Normal rate and regular rhythm.  Pulmonary:     Effort: Pulmonary effort is normal. No respiratory distress.     Breath sounds: No stridor.  Abdominal:     General: There is no distension.     Tenderness: There is generalized abdominal tenderness. There is guarding.  Skin:    General: Skin is warm and dry.  Neurological:     Mental  Status: Andrew Adams is alert and oriented to person, place, and time.     ED Results / Procedures / Treatments   Labs (all labs ordered are listed, but only abnormal results are displayed) Labs Reviewed  COMPREHENSIVE METABOLIC PANEL - Abnormal; Notable for the following components:      Result Value   Glucose, Bld 109 (*)    BUN 41 (*)    Creatinine, Ser 2.75 (*)    Calcium 8.7 (*)    GFR, Estimated 23 (*)    All other components within normal limits  CBC WITH DIFFERENTIAL/PLATELET - Abnormal; Notable for the following components:   RBC 3.60 (*)    Hemoglobin 10.0 (*)    HCT 30.4 (*)    All other components within normal limits  LIPASE, BLOOD  URINALYSIS, ROUTINE W REFLEX MICROSCOPIC    EKG EKG Interpretation  Date/Time:  Tuesday April 18 2023 08:14:22 EDT Ventricular Rate:  70 PR Interval:  177 QRS Duration: 109 QT Interval:  414 QTC Calculation: 447 R Axis:   9 Text Interpretation: Sinus rhythm Nonspecific T abnormalities, lateral leads Confirmed by Gerhard Munch [phone]) on 04/18/2023 10:08:46 AM  Radiology No results found.  Procedures Procedures    Medications Ordered in ED Medications  0.9 %  sodium chloride infusion ( Intravenous New Bag/Given 04/18/23 1042)  fentaNYL (SUBLIMAZE) injection 50 mcg (50 mcg Intravenous Given 04/18/23 1042)  ondansetron (ZOFRAN) injection 4 mg (4 mg Intravenous Given 04/18/23 1042)    ED Course/ Medical Decision Making/ A&P                             Medical Decision Making Adult male with history of prior bowel injuries, bowel obstructions, invasive abdominal surgery presents with acute onset abdominal pain nausea, vomiting following p.o. intake. Differential includes infection, acute food reaction, obstruction.  Patient received fentanyl, Zofran, fluids, CT, labs ordered. Cardiac 75 sinus rhythm Pulse ox 100% room air normal   Amount and/or Complexity of Data Reviewed Independent Historian: spouse    Details: HPI as  above External Data Reviewed: notes. Labs: ordered. Decision-making details documented in ED Course. Radiology: ordered and independent interpretation performed. Decision-making details documented in ED Course.  Risk Prescription drug management. Decision regarding hospitalization. Diagnosis or treatment significantly limited by social determinants of health.   3:28 PM Labs consistent with known renal dysfunction.  I reviewed the patient's CT scan concerning for dilation, on signout the patient is awaiting interpretation as well as repeat evaluation.        Final Clinical Impression(s) / ED Diagnoses Final diagnoses:  Generalized abdominal pain    Rx / DC Orders ED Discharge Orders  None         Gerhard Munch, MD 04/18/23 1536

## 2023-04-18 NOTE — Assessment & Plan Note (Signed)
Stable and compensated.  Last echo 10 /2023- EF of 60 to 65%, grade 2 DD. -Hold Lasix, bisoprolol

## 2023-04-18 NOTE — Progress Notes (Signed)
16 Fr NG tube placed in right nare, taped and order was placed for x-ray. Able to aspirate dark green/brown stomach contents. NG settings on low, intermittent suction.

## 2023-04-18 NOTE — ED Notes (Signed)
Pt states he is on 2 L at baseline

## 2023-04-18 NOTE — Assessment & Plan Note (Signed)
Systolic 140s to 161W -Hold home bisoprolol, hydralazine, Norvasc while n.p.o. -IV hydralazine 10mg  for systolic > 170

## 2023-04-18 NOTE — ED Provider Notes (Addendum)
CT scan consistent with partial small bowel obstruction.  Patient has not had any vomiting since this morning.  But abdomen is distended and tender.  Patient is on the blood thinner Eliquis and patient does use 2 L of oxygen at all times.  Will contact general surgery as a consult.  And contact hospitalist for admission.   Vanetta Mulders, MD 04/18/23 1625    Vanetta Mulders, MD 04/18/23 1626   Discussed with Dr. Henreitta Leber from general surgery she is recommending NG tube n.p.o. and to hold the Eliquis.  She may do a small bowel follow-through tomorrow.  She is also recommending hospitalist admission.   Vanetta Mulders, MD 04/18/23 [phone]

## 2023-04-18 NOTE — Assessment & Plan Note (Addendum)
With chronic respiratory failure on 2 L.  Stable, no wheezing or dyspnea.

## 2023-04-18 NOTE — Assessment & Plan Note (Signed)
CPAP.  

## 2023-04-18 NOTE — ED Triage Notes (Signed)
Pt c/o generalized abdominal pain that started last night after eating dinner last night   Pt has had one episode of vomiting and one small BM

## 2023-04-19 ENCOUNTER — Inpatient Hospital Stay (HOSPITAL_COMMUNITY): Payer: Medicare HMO

## 2023-04-19 DIAGNOSIS — J449 Chronic obstructive pulmonary disease, unspecified: Secondary | ICD-10-CM | POA: Diagnosis not present

## 2023-04-19 DIAGNOSIS — I2724 Chronic thromboembolic pulmonary hypertension: Secondary | ICD-10-CM | POA: Diagnosis not present

## 2023-04-19 DIAGNOSIS — K219 Gastro-esophageal reflux disease without esophagitis: Secondary | ICD-10-CM | POA: Diagnosis present

## 2023-04-19 DIAGNOSIS — I130 Hypertensive heart and chronic kidney disease with heart failure and stage 1 through stage 4 chronic kidney disease, or unspecified chronic kidney disease: Secondary | ICD-10-CM | POA: Diagnosis not present

## 2023-04-19 DIAGNOSIS — Z8249 Family history of ischemic heart disease and other diseases of the circulatory system: Secondary | ICD-10-CM | POA: Diagnosis not present

## 2023-04-19 DIAGNOSIS — K3189 Other diseases of stomach and duodenum: Secondary | ICD-10-CM | POA: Diagnosis not present

## 2023-04-19 DIAGNOSIS — Z4659 Encounter for fitting and adjustment of other gastrointestinal appliance and device: Secondary | ICD-10-CM | POA: Diagnosis not present

## 2023-04-19 DIAGNOSIS — F1010 Alcohol abuse, uncomplicated: Secondary | ICD-10-CM | POA: Diagnosis not present

## 2023-04-19 DIAGNOSIS — Z86711 Personal history of pulmonary embolism: Secondary | ICD-10-CM | POA: Diagnosis not present

## 2023-04-19 DIAGNOSIS — I5032 Chronic diastolic (congestive) heart failure: Secondary | ICD-10-CM | POA: Diagnosis not present

## 2023-04-19 DIAGNOSIS — Z9981 Dependence on supplemental oxygen: Secondary | ICD-10-CM | POA: Diagnosis not present

## 2023-04-19 DIAGNOSIS — G4733 Obstructive sleep apnea (adult) (pediatric): Secondary | ICD-10-CM | POA: Diagnosis present

## 2023-04-19 DIAGNOSIS — Z961 Presence of intraocular lens: Secondary | ICD-10-CM | POA: Diagnosis present

## 2023-04-19 DIAGNOSIS — K56600 Partial intestinal obstruction, unspecified as to cause: Principal | ICD-10-CM

## 2023-04-19 DIAGNOSIS — Z9841 Cataract extraction status, right eye: Secondary | ICD-10-CM | POA: Diagnosis not present

## 2023-04-19 DIAGNOSIS — Z86010 Personal history of colonic polyps: Secondary | ICD-10-CM | POA: Diagnosis not present

## 2023-04-19 DIAGNOSIS — J9611 Chronic respiratory failure with hypoxia: Secondary | ICD-10-CM | POA: Diagnosis not present

## 2023-04-19 DIAGNOSIS — Z9842 Cataract extraction status, left eye: Secondary | ICD-10-CM | POA: Diagnosis not present

## 2023-04-19 DIAGNOSIS — Z7901 Long term (current) use of anticoagulants: Secondary | ICD-10-CM | POA: Diagnosis not present

## 2023-04-19 DIAGNOSIS — N184 Chronic kidney disease, stage 4 (severe): Secondary | ICD-10-CM | POA: Diagnosis not present

## 2023-04-19 DIAGNOSIS — Z79899 Other long term (current) drug therapy: Secondary | ICD-10-CM | POA: Diagnosis not present

## 2023-04-19 DIAGNOSIS — E785 Hyperlipidemia, unspecified: Secondary | ICD-10-CM | POA: Diagnosis present

## 2023-04-19 DIAGNOSIS — Z87891 Personal history of nicotine dependence: Secondary | ICD-10-CM | POA: Diagnosis not present

## 2023-04-19 DIAGNOSIS — Z833 Family history of diabetes mellitus: Secondary | ICD-10-CM | POA: Diagnosis not present

## 2023-04-19 DIAGNOSIS — K56609 Unspecified intestinal obstruction, unspecified as to partial versus complete obstruction: Secondary | ICD-10-CM | POA: Diagnosis not present

## 2023-04-19 DIAGNOSIS — E1122 Type 2 diabetes mellitus with diabetic chronic kidney disease: Secondary | ICD-10-CM | POA: Diagnosis not present

## 2023-04-19 LAB — BASIC METABOLIC PANEL
Anion gap: 8 (ref 5–15)
BUN: 44 mg/dL — ABNORMAL HIGH (ref 8–23)
CO2: 28 mmol/L (ref 22–32)
Calcium: 7.9 mg/dL — ABNORMAL LOW (ref 8.9–10.3)
Chloride: 103 mmol/L (ref 98–111)
Creatinine, Ser: 2.85 mg/dL — ABNORMAL HIGH (ref 0.61–1.24)
GFR, Estimated: 22 mL/min — ABNORMAL LOW (ref >60)
Glucose, Bld: 83 mg/dL (ref 70–99)
Potassium: 3.6 mmol/L (ref 3.5–5.1)
Sodium: 139 mmol/L (ref 135–145)

## 2023-04-19 LAB — CBC
HCT: 28.3 % — ABNORMAL LOW (ref 39.0–52.0)
Hemoglobin: 9.2 g/dL — ABNORMAL LOW (ref 13.0–17.0)
MCH: 28.2 pg (ref 26.0–34.0)
MCHC: 32.5 g/dL (ref 30.0–36.0)
MCV: 86.8 fL (ref 80.0–100.0)
Platelets: 151 10*3/uL (ref 150–400)
RBC: 3.26 MIL/uL — ABNORMAL LOW (ref 4.22–5.81)
RDW: 13.7 % (ref 11.5–15.5)
WBC: 5.3 10*3/uL (ref 4.0–10.5)
nRBC: 0 % (ref 0.0–0.2)

## 2023-04-19 LAB — GLUCOSE, CAPILLARY
Glucose-Capillary: 76 mg/dL (ref 70–99)
Glucose-Capillary: 85 mg/dL (ref 70–99)

## 2023-04-19 MED ORDER — DIATRIZOATE MEGLUMINE & SODIUM 66-10 % PO SOLN
90.0000 mL | Freq: Once | ORAL | Status: AC
  Administered 2023-04-19: 90 mL via NASOGASTRIC
  Filled 2023-04-19: qty 90

## 2023-04-19 MED ORDER — IPRATROPIUM-ALBUTEROL 0.5-2.5 (3) MG/3ML IN SOLN
3.0000 mL | Freq: Three times a day (TID) | RESPIRATORY_TRACT | Status: DC
  Administered 2023-04-19 (×2): 3 mL via RESPIRATORY_TRACT
  Filled 2023-04-19: qty 3

## 2023-04-19 MED ORDER — ENOXAPARIN SODIUM 100 MG/ML IJ SOSY
100.0000 mg | PREFILLED_SYRINGE | Freq: Once | INTRAMUSCULAR | Status: AC
  Administered 2023-04-19: 100 mg via SUBCUTANEOUS
  Filled 2023-04-19: qty 1

## 2023-04-19 MED ORDER — KCL IN DEXTROSE-NACL 20-5-0.45 MEQ/L-%-% IV SOLN: INTRAVENOUS | Status: DC

## 2023-04-19 MED ORDER — METOPROLOL TARTRATE 5 MG/5ML IV SOLN
2.5000 mg | Freq: Three times a day (TID) | INTRAVENOUS | Status: DC
  Administered 2023-04-19 – 2023-04-20 (×3): 2.5 mg via INTRAVENOUS
  Filled 2023-04-19 (×3): qty 5

## 2023-04-19 MED ORDER — HYDRALAZINE HCL 20 MG/ML IJ SOLN
5.0000 mg | Freq: Four times a day (QID) | INTRAMUSCULAR | Status: DC
  Administered 2023-04-19 – 2023-04-20 (×4): 5 mg via INTRAVENOUS
  Filled 2023-04-19 (×4): qty 1

## 2023-04-19 MED ORDER — ALBUTEROL SULFATE (2.5 MG/3ML) 0.083% IN NEBU: 2.5000 mg | INHALATION_SOLUTION | RESPIRATORY_TRACT | Status: DC | PRN

## 2023-04-19 MED ORDER — FUROSEMIDE 10 MG/ML IJ SOLN
40.0000 mg | Freq: Every day | INTRAMUSCULAR | Status: DC
  Administered 2023-04-19: 40 mg via INTRAVENOUS
  Filled 2023-04-19 (×2): qty 4

## 2023-04-19 MED ORDER — PANTOPRAZOLE SODIUM 40 MG IV SOLR
40.0000 mg | Freq: Every day | INTRAVENOUS | Status: DC
  Administered 2023-04-19: 40 mg via INTRAVENOUS
  Filled 2023-04-19 (×2): qty 10

## 2023-04-19 NOTE — Progress Notes (Addendum)
Transition of Care [facility]) - Inpatient Brief Assessment   Patient Details  Name: Andrew [place] MRN: [id number] Date of Birth: 05/14/1946  Transition [facility] Care [facility] CM/SW Contact:    Leitha Bleak, RN Phone Number: 04/19/2023, 1:28 PM   Clinical Narrative:  Patient admitted with partial small bowel obstruction. GI following.  Transition of Care Department [facility]) has reviewed patient and no TOC needs have been identified at this time. We will continue to monitor patient advancement through interdisciplinary progression rounds. If new patient transition needs arise, please place a TOC consult.   Transition of Care Asessment: Insurance and Status: Insurance coverage has been reviewed Patient has primary care physician: Yes Home environment has been reviewed: Home Prior level of function:: (P) independent Prior/Current Home Services: No current home services   Readmission risk has been reviewed: Yes Transition of care needs: no transition of care needs at this time

## 2023-04-19 NOTE — Progress Notes (Signed)
Patient refused cpap due to NG in nose.

## 2023-04-19 NOTE — Consult Note (Signed)
[facility] Consult  Reason for Consult: SBO  Referring Physician: Dr. Jarvis Newcomer   Chief Complaint   Abdominal Pain     HPI: [place] is a 77 y.o. male with a history of DM, diastolic CHF, GERD, HTN, PE in October on Eliquis who has a SBO on CT. He started having nausea/vomiting and abdominal pain with bloating on Monday after taking his Eliquis. He had a prior SBO with surgery and prior SBO requiring only NG tube and NPO. He says he is having flatus now and he has an NG in place. He feels like his abdomen is less distended.   Past Medical History:  Diagnosis Date   Anemia    Borderline diabetic    Chronic kidney disease    Diabetes mellitus without complication (HCC)    Duodenal adenoma    2021   GERD (gastroesophageal reflux disease)    Gout    Gouty arthritis    H/O small bowel obstruction    History of back surgery    Hx of adenomatous colonic polyps    2021   Hyperlipidemia    Hypertension    PE (pulmonary embolism)    Sleep apnea     Past Surgical History:  Procedure Laterality Date   ABDOMINAL SURGERY  2103   Smal bowel obstruction    ABDOMINAL SURGERY  1981   Perforated large intestine   back     Lower back   BIOPSY  08/28/2020   Procedure: BIOPSY;  Surgeon: Dolores Frame, MD;  [facility];  Service: Gastroenterology;;   BIOPSY  02/07/2022   Procedure: BIOPSY;  Surgeon: Lemar Lofty., MD;  Location: [facility];  Service: Gastroenterology;;   CATARACT EXTRACTION W/PHACO Right 01/06/2020   Procedure: CATARACT EXTRACTION PHACO AND INTRAOCULAR LENS PLACEMENT (IOC) (CDE: 6.48);  Surgeon: Fabio Pierce, MD;  Location: [facility];  Service: Ophthalmology;  Laterality: Right;   CATARACT EXTRACTION W/PHACO Left 01/20/2020   Procedure: CATARACT EXTRACTION PHACO AND INTRAOCULAR LENS PLACEMENT (IOC);  Surgeon: Fabio Pierce, MD;  Location: [facility];  Service: Ophthalmology;  Laterality: Left;  CDE: 5.86   COLONOSCOPY WITH  PROPOFOL N/A 08/28/2020   Procedure: COLONOSCOPY WITH PROPOFOL;  Surgeon: Dolores Frame, MD;  [facility];  Service: Gastroenterology;  Laterality: N/A;  230   ENDOSCOPIC MUCOSAL RESECTION N/A 02/07/2022   Procedure: ENDOSCOPIC MUCOSAL RESECTION;  Surgeon: Meridee Score Netty Starring., MD;  Location: [facility];  Service: Gastroenterology;  Laterality: N/A;   ESOPHAGOGASTRODUODENOSCOPY N/A 02/07/2022   Procedure: ESOPHAGOGASTRODUODENOSCOPY (EGD);  Surgeon: Lemar Lofty., MD;  Location: [facility];  Service: Gastroenterology;  Laterality: N/A;   ESOPHAGOGASTRODUODENOSCOPY (EGD) WITH PROPOFOL N/A 08/28/2020   Procedure: ESOPHAGOGASTRODUODENOSCOPY (EGD) WITH PROPOFOL;  Surgeon: Dolores Frame, MD;  [facility];  Service: Gastroenterology;  Laterality: N/A;   ESOPHAGOGASTRODUODENOSCOPY (EGD) WITH PROPOFOL N/A 12/14/2020   Procedure: ESOPHAGOGASTRODUODENOSCOPY (EGD) WITH PROPOFOL;  Surgeon: Meridee Score Netty Starring., MD;  [facility]: [facility];  Service: Gastroenterology;  Laterality: N/A;   GIVENS CAPSULE STUDY N/A 09/10/2020   Procedure: GIVENS CAPSULE STUDY;  Surgeon: Dolores Frame, MD;  [facility];  Service: Gastroenterology;  Laterality: N/A;  730   HEMOSTASIS CLIP PLACEMENT  12/14/2020   Procedure: HEMOSTASIS CLIP PLACEMENT;  Surgeon: Lemar Lofty., MD;  Location: [facility];  Service: Gastroenterology;;   HEMOSTASIS CLIP PLACEMENT  02/07/2022   Procedure: HEMOSTASIS CLIP PLACEMENT;  Surgeon: Lemar Lofty., MD;  Location: [facility];  Service: Gastroenterology;;  HEMOSTASIS CONTROL  12/14/2020   Procedure: HEMOSTASIS CONTROL;  Surgeon: Meridee Score Netty Starring., MD;  Location: [facility];  Service: Gastroenterology;;   HERNIA REPAIR     incisional   HOT HEMOSTASIS N/A 02/07/2022   Procedure: HOT HEMOSTASIS (ARGON PLASMA COAGULATION/BICAP);  Surgeon: Lemar Lofty., MD;  Location: [facility];  Service: Gastroenterology;  Laterality: N/A;   POLYPECTOMY  08/28/2020   Procedure: POLYPECTOMY;  Surgeon: Dolores Frame, MD;  [facility];  Service: Gastroenterology;;   POLYPECTOMY  12/14/2020   Procedure: POLYPECTOMY;  Surgeon: Lemar Lofty., MD;  Location: [facility];  Service: Gastroenterology;;   Susa Day  12/14/2020   Procedure: Susa Day;  Surgeon: Mansouraty, Netty Starring., MD;  Location: [facility];  Service: Gastroenterology;;   SUBMUCOSAL LIFTING INJECTION  02/07/2022   Procedure: SUBMUCOSAL LIFTING INJECTION;  Surgeon: Lemar Lofty., MD;  Location: [facility];  Service: Gastroenterology;;   SUBMUCOSAL TATTOO INJECTION  12/14/2020   Procedure: SUBMUCOSAL TATTOO INJECTION;  Surgeon: Lemar Lofty., MD;  Location: [facility];  Service: Gastroenterology;;    Family History  Problem Relation Age of Onset   Hypertension Sister    Diabetes Sister    Diabetes Sister    Hypertension Mother    Diabetes Mother    Hypertension Father    Ulcers Father    Colon cancer Neg Hx    Esophageal cancer Neg Hx    Stomach cancer Neg Hx    Inflammatory bowel disease Neg Hx    Liver disease Neg Hx    Pancreatic cancer Neg Hx    Rectal cancer Neg Hx     Social History   Tobacco Use   Smoking status: Former    Packs/day: 1.00    Years: 38.00    Additional pack years: 0.00    Total pack years: 38.00    Types: Cigarettes    Quit date: 02/07/1995    Years since quitting: 28.2   Smokeless tobacco: Never  Vaping Use   Vaping Use: Never used  Substance Use Topics   Alcohol use: Yes    Alcohol/week: 2.0 standard drinks of alcohol    Types: 2 Shots of liquor per week    Comment: occasional    Drug use: Yes    Types: Marijuana    Medications: I have reviewed the patient's current medications. Prior to Admission:  Medications Prior to Admission  Medication Sig Dispense Refill Last Dose   albuterol (VENTOLIN  HFA) 108 (90 Base) MCG/ACT inhaler Inhale 2 puffs into the lungs every 4 (four) hours as needed.      allopurinol (ZYLOPRIM) 100 MG tablet Take 1 tablet (100 mg total) by mouth daily. 90 tablet 1    amLODipine (NORVASC) 5 MG tablet Take 5 mg by mouth daily.      apixaban (ELIQUIS) 5 MG TABS tablet Take 2 tablets (10 mg total) by mouth 2 (two) times daily. On 08/27/22, start 5 mg  (1 tab) two times daily 74 tablet 1    atorvastatin (LIPITOR) 40 MG tablet Take 1 tablet (40 mg total) by mouth daily. 90 tablet 1    bisoprolol (ZEBETA) 5 MG tablet Take 1 tablet (5 mg total) by mouth daily. 90 tablet 3    epoetin alfa (EPOGEN) 3000 UNIT/ML injection Inject into the skin.      epoetin alfa-epbx (RETACRIT) 3000 UNIT/ML injection Inject 3,000 Units into the vein every 14 (fourteen) days.      FEROSUL 325 (65 Fe) MG tablet TAKE ONE  TABLET BY MOUTH EVERY MORNING WITH BREAKFAST 90 tablet 3    furosemide (LASIX) 40 MG tablet Take 1 tablet (40 mg total) by mouth daily. (Patient taking differently: Take 2 tabs (80mg ) by mouth every morning & 1 tab (40mg ) every evening) 30 tablet 1    hydrALAZINE (APRESOLINE) 25 MG tablet Take 1 tablet (25 mg total) by mouth every 6 (six) hours. 90 tablet 1    ipratropium-albuterol (DUONEB) 0.5-2.5 (3) MG/3ML SOLN Take 3 mLs by nebulization every 6 (six) hours as needed. 360 mL 5    Multiple Vitamin (MULTIVITAMIN WITH MINERALS) TABS tablet Take 1 tablet by mouth daily.      pantoprazole (PROTONIX) 40 MG tablet Take 1 tablet (40 mg total) by mouth 2 (two) times daily before a meal. (Patient taking differently: Take 40 mg by mouth daily.) 60 tablet 12    potassium chloride (KLOR-CON) 10 MEQ tablet Take 10 mEq by mouth daily.      TRULICITY 0.75 MG/0.5ML SOPN Inject 0.75 mg into the skin every Friday.      Scheduled:  enoxaparin (LOVENOX) injection  100 mg Subcutaneous Once   furosemide  40 mg Intravenous Daily   hydrALAZINE  5 mg Intravenous Q6H   insulin aspart  0-9 Units  Subcutaneous Q8H   ipratropium-albuterol  3 mL Nebulization TID   metoprolol tartrate  2.5 mg Intravenous Q8H   pantoprazole (PROTONIX) IV  40 mg Intravenous Daily   Continuous:  dextrose 5 % and 0.45 % NaCl with KCl 20 mEq/L 75 mL/hr at 04/19/23 0805   ZOX:[id number] **OR** acetaminophen, albuterol, hydrALAZINE, morphine injection, ondansetron **OR** ondansetron (ZOFRAN) IV, polyethylene glycol  No Known Allergies   ROS:  A comprehensive review of systems was negative except for: Gastrointestinal: positive for abdominal pain and nausea  Blood pressure (!) 156/89, pulse 77, temperature 98.3 F (36.8 C), temperature source Axillary, resp. rate 20, height 5\' 9"  (1.753 m), weight 96.7 kg, SpO2 100 %. Physical Exam Vitals reviewed.  HENT:     Head: Normocephalic.  Cardiovascular:     Rate and Rhythm: Normal rate.  Pulmonary:     Effort: Pulmonary effort is normal.  Abdominal:     General: There is distension.     Palpations: Abdomen is soft.     Tenderness: There is generalized abdominal tenderness. There is no guarding or rebound.  Skin:    General: Skin is warm.  Neurological:     General: No focal deficit present.     Mental Status: He is alert.  Psychiatric:        Mood and Affect: Mood normal.        Behavior: Behavior normal.     Results: Results for orders placed or performed during the hospital encounter of 04/18/23 (from the past 48 hour(s))  Comprehensive metabolic panel     Status: Abnormal   Collection Time: 04/18/23  8:47 AM  Result Value Ref Range   Sodium 137 135 - 145 mmol/L   Potassium 3.7 3.5 - 5.1 mmol/L   Chloride 99 98 - 111 mmol/L   CO2 28 22 - 32 mmol/L   Glucose, Bld 109 (H) 70 - 99 mg/dL    Comment: Glucose reference range applies only to samples taken after fasting for at least 8 hours.   BUN 41 (H) 8 - 23 mg/dL   Creatinine, Ser 1.47 (H) 0.61 - 1.24 mg/dL   Calcium 8.7 (L) 8.9 - 10.3 mg/dL   Total Protein 6.8 6.5 - 8.1 g/dL  Albumin  3.8 3.5 - 5.0 g/dL   AST 17 15 - 41 U/L   ALT 14 0 - 44 U/L   Alkaline Phosphatase 44 38 - 126 U/L   Total Bilirubin 0.9 0.3 - 1.2 mg/dL   GFR, Estimated 23 (L) >60 mL/min    Comment: (NOTE) Calculated using the CKD-EPI Creatinine Equation (2021)    Anion gap 10 5 - 15    Comment: Performed at [facility], [address], [place], [place] [address]  Lipase, blood     Status: None   Collection Time: 04/18/23  8:47 AM  Result Value Ref Range   Lipase 37 11 - 51 U/L    Comment: Performed at [facility], [address] [place], [place] [address]  CBC with Diff     Status: Abnormal   Collection Time: 04/18/23  8:47 AM  Result Value Ref Range   WBC 6.4 4.0 - 10.5 K/uL   RBC 3.60 (L) 4.22 - 5.81 MIL/uL   Hemoglobin 10.0 (L) 13.0 - 17.0 g/dL   HCT 09.8 (L) 11.9 - 14.7 %   MCV 84.4 80.0 - 100.0 fL   MCH 27.8 26.0 - 34.0 pg   MCHC 32.9 30.0 - 36.0 g/dL   RDW 82.9 56.2 - 13.0 %   Platelets 172 150 - 400 K/uL   nRBC 0.0 0.0 - 0.2 %   Neutrophils Relative % 74 %   Neutro Abs 4.8 1.7 - 7.7 K/uL   Lymphocytes Relative 17 %   Lymphs Abs 1.1 0.7 - 4.0 K/uL   Monocytes Relative 8 %   Monocytes Absolute 0.5 0.1 - 1.0 K/uL   Eosinophils Relative 1 %   Eosinophils Absolute 0.1 0.0 - 0.5 K/uL   Basophils Relative 0 %   Basophils Absolute 0.0 0.0 - 0.1 K/uL   Immature Granulocytes 0 %   Abs Immature Granulocytes 0.01 0.00 - 0.07 K/uL    Comment: Performed at [facility], [address], [place], [place] [address]  Urinalysis, Routine w reflex microscopic -Urine, Clean Catch     Status: Abnormal   Collection Time: 04/18/23  5:53 PM  Result Value Ref Range   Color, Urine YELLOW YELLOW   APPearance CLEAR CLEAR   Specific Gravity, Urine 1.013 1.005 - 1.030   pH 5.0 5.0 - 8.0   Glucose, UA NEGATIVE NEGATIVE mg/dL   Hgb urine dipstick NEGATIVE NEGATIVE   Bilirubin Urine NEGATIVE NEGATIVE   Ketones, ur NEGATIVE NEGATIVE mg/dL   Protein, ur 469 (A) NEGATIVE mg/dL   Nitrite NEGATIVE NEGATIVE    Leukocytes,Ua NEGATIVE NEGATIVE   RBC / HPF 0-5 0 - 5 RBC/hpf   WBC, UA 0-5 0 - 5 WBC/hpf   Bacteria, UA NONE SEEN NONE SEEN   Squamous Epithelial / HPF 0-5 0 - 5 /HPF    Comment: Performed at [facility], [address] [place], [place] [address]  Glucose, capillary     Status: Abnormal   Collection Time: 04/18/23  8:17 PM  Result Value Ref Range   Glucose-Capillary 103 (H) 70 - 99 mg/dL    Comment: Glucose reference range applies only to samples taken after fasting for at least 8 hours.   Comment 1 Notify RN    Comment 2 Document in Chart   Basic metabolic panel     Status: Abnormal   Collection Time: 04/19/23  4:00 AM  Result Value Ref Range   Sodium 139 135 - 145 mmol/L   Potassium 3.6 3.5 - 5.1  mmol/L   Chloride 103 98 - 111 mmol/L   CO2 28 22 - 32 mmol/L   Glucose, Bld 83 70 - 99 mg/dL    Comment: Glucose reference range applies only to samples taken after fasting for at least 8 hours.   BUN 44 (H) 8 - 23 mg/dL   Creatinine, Ser 1.47 (H) 0.61 - 1.24 mg/dL   Calcium 7.9 (L) 8.9 - 10.3 mg/dL   GFR, Estimated 22 (L) >60 mL/min    Comment: (NOTE) Calculated using the CKD-EPI Creatinine Equation (2021)    Anion gap 8 5 - 15    Comment: Performed at [facility], [address] [place], [place] [address]  CBC     Status: Abnormal   Collection Time: 04/19/23  4:00 AM  Result Value Ref Range   WBC 5.3 4.0 - 10.5 K/uL   RBC 3.26 (L) 4.22 - 5.81 MIL/uL   Hemoglobin 9.2 (L) 13.0 - 17.0 g/dL   HCT 21.3 (L) 08.6 - 57.8 %   MCV 86.8 80.0 - 100.0 fL   MCH 28.2 26.0 - 34.0 pg   MCHC 32.5 30.0 - 36.0 g/dL   RDW 46.9 62.9 - 52.8 %   Platelets 151 150 - 400 K/uL   nRBC 0.0 0.0 - 0.2 %    Comment: Performed at [facility], [address], [place], [place] [address]  Glucose, capillary     Status: None   Collection Time: 04/19/23  7:47 AM  Result Value Ref Range   Glucose-Capillary 76 70 - 99 mg/dL    Comment: Glucose reference range applies only to samples taken after fasting  for at least 8 hours.   Personally reviewed - area in the RLQ with fecalized bowel going from distended to decompressed  DG Chest Port 1 View  Result Date: 04/18/2023 CLINICAL DATA:  Check gastric catheter placement EXAM: PORTABLE CHEST 1 VIEW COMPARISON:  None Available. FINDINGS: Gastric catheter is noted extending into the distal stomach. Cardiac shadow is enlarged. Mild central vascular prominence is noted. No focal infiltrate or effusion is seen. IMPRESSION: Gastric catheter within the stomach. Electronically Signed   By: Alcide Clever M.D.   On: 04/18/2023 22:09   CT ABDOMEN PELVIS WO CONTRAST  Result Date: 04/18/2023 CLINICAL DATA:  Abdominal pain EXAM: CT ABDOMEN AND PELVIS WITHOUT CONTRAST TECHNIQUE: Multidetector CT imaging of the abdomen and pelvis was performed following the standard protocol without IV contrast. RADIATION DOSE REDUCTION: This exam was performed according to the departmental dose-optimization program which includes automated exposure control, adjustment of the mA and/or kV according to patient size and/or use of iterative reconstruction technique. COMPARISON:  10/17/2021 FINDINGS: Lower chest: There are few linear and pleural-based densities in the lower lung fields with no interval change. There is 6 mm subpleural nodule in right lower lobe with no interval change. Coronary artery calcifications are seen. Hepatobiliary: No focal abnormalities are seen in liver. There is no dilation of bile ducts. Gallbladder is unremarkable. There is 2 mm calcific density in the lateral margin of gallbladder. There is no wall thickening or pericholecystic fluid collection. Pancreas: No focal abnormalities are seen. Spleen: Unremarkable. Adrenals/Urinary Tract: Adrenals are unremarkable. There is no hydronephrosis. There are few smooth myelinated lesions in both kidneys, more so on the right side suggesting renal cysts with no significant interval change. Urinary bladder is not distended.  Stomach/Bowel: Small hiatal hernia is seen. There is abnormal dilation of small bowel loops measuring up to 5.1 cm in maximum diameter. There  is fecalization of a small bowel loop in right lower abdomen. Zone of transition appears to be in right lower quadrant. There is no demonstrable soft tissue mass in the region of the transition. Multiple diverticula are seen in colon without signs of focal acute diverticulitis. Appendix is not visualized. Vascular/Lymphatic: Scattered calcifications are seen in aorta and its major branches. Reproductive: Prostate is enlarged. Other: There is no ascites or pneumoperitoneum. There is evidence of previous ventral hernia repair. Small bilateral inguinal hernias containing fat are noted. Musculoskeletal: Degenerative changes are noted in lumbar spine, more so at L4-L5 and L5-S1 levels. IMPRESSION: There is marked dilation of proximal small bowel loops and decompression of distal small bowel loops consistent with high-grade partial small bowel obstruction, possibly caused by ideations. Zone of transition is noted in right lower quadrant. There is no hydronephrosis. Diverticulosis of colon without signs of focal diverticulitis. There is 2 mm calcific density in the margin of the gallbladder which may suggest gallbladder stone adherent to the mucosa are calcified granuloma in the wall of the gallbladder. Aortic arteriosclerosis. Coronary artery disease. Small hiatal hernia. Renal cysts. Lumbar spondylosis. Other findings as described in the body of the report. Electronically Signed   By: Ernie Avena M.D.   On: 04/18/2023 15:46     Assessment & Plan:  QUILLEN PON is a 77 y.o. male with SBO on CT. Improving clinically. Will get SBFT to see if this resolves. Discussed with patient etiology of SBO and trying to not do surgery. Discussed potential need for surgery, therapeutic and diagnostic benefits of SBFT.  -NPO, NG  -SBFT today -Lovenox therapeutic ok today  All  questions were answered to the satisfaction of the patient.    Lucretia Roers 04/19/2023, 9:11 AM

## 2023-04-19 NOTE — Progress Notes (Signed)
[facility]  SBFT with contrast in colon. Dc NG and clear diet.   Andrew Greenhouse, MD [facility] [address] [place], [place] [address] [phone] (office)

## 2023-04-19 NOTE — Progress Notes (Signed)
[facility] PROGRESS NOTE  COPPER SCHERFF (DOB: 01/26/1946) ZOX:[id number] PCP: Lovey Newcomer, PA  Brief Narrative: TAEVYN VANALST is a 77 y.o. male with a history of bowel perforation and surgery, stage IV CKD, chronic HFpEF, T2DM, chronic 2L O2-dependent COPD, HTN, PE on eliquis who presented to the ED on 04/18/2023 after abrupt onset of abdominal pain the previous evening. CT abd/pelvis showed marked dilatation of proximal small bowel loops with distal decompression beyond transition point in RLQ. NG tube placed, analgesics provided, and the patient was admitted to hospitalist service with surgery consultation pending.  Subjective: Abdominal pain is slightly improved, but still taking pain medications. +flatus x3 this AM. No BM since late 6/17. No nausea. Had vomiting PTA but none since. No dyspnea.  Objective: BP (!) 156/89 (BP Location: Right Arm)   Pulse 77   Temp 98.3 F (36.8 C) (Axillary)   Resp 20   Ht 5\' 9"  (1.753 m)   Wt 96.7 kg   SpO2 100%   BMI 31.47 kg/m   Gen: No distress Pulm: Bibasilar crackles and end expiratory squeaks and wheezing diffusely noted.   CV: RRR, no MRG, 1+ dependent edema (pt reports near baseline) GI: Tender R > L and distended with hypoactive (but present) bowel sounds. No rebound. Neuro: Alert and oriented. No new focal deficits. Ext: Warm, no deformities. Skin: No new rashes, lesions or ulcers on visualized skin   Assessment & Plan: Principal Problem:   Partial small bowel obstruction (HCC) Active Problems:   CKD (chronic kidney disease), stage IV (HCC)   Essential hypertension   Chronic alcohol abuse   Chronic respiratory failure with hypoxia (HCC)   OSA (obstructive sleep apnea)   COPD (chronic obstructive pulmonary disease) (HCC)   Pulmonary embolism (HCC)   Chronic diastolic CHF (congestive heart failure) (HCC)  Partial SBO: Having flatus and symptoms seem improved with NGT. Still distended but has bowel sounds.  -  Appreciate general surgery input. May be candidate for clamping trial today.  - Continue IVF, change to D5 1/2NS w/75m mEq KCl.  - Continue prn pain medications, IV medications.  - Start lovenox in place of DOAC (last dose 6/17 AM [vomiting in PM so didn't take that dose]). Will give single dose now and reevaluate after timing of surgery decided more definitively.  Chronic hypoxic respiratory failure, COPD, OSA, chronic HFpEF, ?CTEPH:  - Continue 2L O2, controlled COPD medications, CPAP qHS as tolerated.  - With wheezing, schedule duoneb (takes BID at home) and add prn albuterol - With crackles, restart lasix.   History of PE:  - Therapeutic dose lovenox while holding DOAC  Chronic HFpEF, HTN: Last echo Oct 2023: LVEF 60-65%, G2DD.  - Restart lasix (give IV for now and just once daily) and change home bisoprolol to IV metoprolol for now, change po hydralazine to IV.  Stage IV CKD: At baseline.  - Continue IVF given SBO with GI losses, restart home lasix to avoid overload, help with HTN  AOCKD: Stable. Gets outpt Tx.   HLD:  - Hold statin  GERD:  - PPI IV  Tyrone Nine, MD [facility] [email or web address] 04/19/2023, 9:50 AM

## 2023-04-20 DIAGNOSIS — K56600 Partial intestinal obstruction, unspecified as to cause: Secondary | ICD-10-CM | POA: Diagnosis not present

## 2023-04-20 LAB — BASIC METABOLIC PANEL
Anion gap: 12 (ref 5–15)
BUN: 37 mg/dL — ABNORMAL HIGH (ref 8–23)
CO2: 27 mmol/L (ref 22–32)
Calcium: 8.5 mg/dL — ABNORMAL LOW (ref 8.9–10.3)
Chloride: 103 mmol/L (ref 98–111)
Creatinine, Ser: 2.8 mg/dL — ABNORMAL HIGH (ref 0.61–1.24)
GFR, Estimated: 22 mL/min — ABNORMAL LOW (ref >60)
Glucose, Bld: 176 mg/dL — ABNORMAL HIGH (ref 70–99)
Potassium: 3.3 mmol/L — ABNORMAL LOW (ref 3.5–5.1)
Sodium: 142 mmol/L (ref 135–145)

## 2023-04-20 LAB — GLUCOSE, CAPILLARY
Glucose-Capillary: 102 mg/dL — ABNORMAL HIGH (ref 70–99)
Glucose-Capillary: 93 mg/dL (ref 70–99)

## 2023-04-20 LAB — HEMOGLOBIN A1C
Hgb A1c MFr Bld: 6.3 % — ABNORMAL HIGH (ref 4.8–5.6)
Mean Plasma Glucose: 134 mg/dL

## 2023-04-20 MED ORDER — IPRATROPIUM-ALBUTEROL 0.5-2.5 (3) MG/3ML IN SOLN
3.0000 mL | Freq: Three times a day (TID) | RESPIRATORY_TRACT | Status: DC
  Administered 2023-04-20 (×2): 3 mL via RESPIRATORY_TRACT
  Filled 2023-04-20 (×2): qty 3

## 2023-04-20 NOTE — Progress Notes (Signed)
NG tube removed by this Clinical research associate per MD order. Pt tolerated well. Pt has since had a popsicle and ice water with no c/o pain or discomfort. Pt abdomen still distended but soft. Passing gas. Had large loose bowel movement x1. Plan of care ongoing.

## 2023-04-20 NOTE — Progress Notes (Signed)
Pt IV infiltrated and removed. MD contacted regarding no access, ultrasound was needed for last IV. MD stated there is no need for another one at this time, pt is expected to be discharged sometime today.

## 2023-04-20 NOTE — TOC Transition Note (Signed)
Transition of Care [facility]) - CM/SW Discharge Note   Patient Details  Name: Andrew Adams MRN: [id number] Date of Birth: 09-09-1946  Transition of Care [facility]) CM/SW Contact:  Leitha Bleak, RN Phone Number: 04/20/2023, 2:33 PM   Clinical Narrative:   Patient medically ready for discharge.  He now has high risk for readmission score. Patient lives at home with his wife, She is at the bedside to transport home. He uses home oxygen provided by [facility]. He has used home health in the past and does not feel like he needs at this time.    Final next level of care: Home/Self Care Barriers to Discharge: Barriers Resolved   Patient Goals and CMS Choice CMS Medicare.gov Compare Post Acute Care list provided to:: Patient Choice offered to / list presented to : Patient  Discharge Placement                  Discharge Plan and Services Additional resources added to the After Visit Summary for          Social Determinants of Health (SDOH) Interventions SDOH Screenings   Food Insecurity: No Food Insecurity (01/31/2023)  Housing: Low Risk  (01/31/2023)  Transportation Needs: No Transportation Needs (01/31/2023)  Utilities: Not At Risk (01/31/2023)  Depression (PHQ2-9): Low Risk  (01/31/2023)  Tobacco Use: Medium Risk (04/18/2023)     Readmission Risk Interventions    04/20/2023    2:33 PM 10/30/2022   10:50 AM 10/21/2022    3:31 PM  Readmission Risk Prevention Plan  Transportation Screening Complete  Complete  PCP or Specialist Appt within 3-5 Days Complete Complete Not Complete  HRI or Home Care Consult Complete  Complete  Social Work Consult for Recovery Care Planning/Counseling Complete  Complete  Palliative Care Screening Not Applicable  Not Applicable  Medication Review Oceanographer) Complete  Complete

## 2023-04-20 NOTE — Discharge Summary (Signed)
Physician Discharge Summary   Patient: Andrew Adams MRN: [id number] DOB: 1946/09/25  Admit date:     04/18/2023  Discharge date: 04/20/23  Discharge Physician: Tyrone Nine   PCP: Lovey Newcomer, PA   Recommendations at discharge:   Follow up with PCP  Discharge Diagnoses: Principal Problem:   Partial small bowel obstruction (HCC) Active Problems:   CKD (chronic kidney disease), stage IV (HCC)   Essential hypertension   Chronic alcohol abuse   Chronic respiratory failure with hypoxia (HCC)   OSA (obstructive sleep apnea)   COPD (chronic obstructive pulmonary disease) (HCC)   Pulmonary embolism (HCC)   Chronic diastolic CHF (congestive heart failure) [facility])  Hospital Course: ZAIR CASSERLY is a 77 y.o. male with a history of bowel perforation and surgery, stage IV CKD, chronic HFpEF, T2DM, chronic 2L O2-dependent COPD, HTN, PE on eliquis who presented to the ED on 04/18/2023 after abrupt onset of abdominal pain the previous evening. CT abd/pelvis showed marked dilatation of proximal small bowel loops with distal decompression beyond transition point in RLQ. NG tube placed, analgesics provided, and the patient was admitted to hospitalist service with surgery consultation pending. He improved with conservative management, had NG pulled, has had multiple bowel movements, resolution of symptoms and has advanced diet successfully.   Assessment and Plan: Partial SBO: Contrast to colon on protocol, NG removed, tolerating an advanced diet with +BMs, resolution of symptoms. - Appreciate general surgery input. Ok with DC, d/w Dr. Henreitta Leber.   Chronic hypoxic respiratory failure, COPD, OSA, chronic HFpEF, ?CTEPH:  - Continue 2L O2, controller COPD medications, CPAP qHS as tolerated.  - Wheezing resolved with breathing treatment   History of PE:  - Therapeutic dose lovenox while holding DOAC, return to eliquis at DC   Chronic HFpEF, HTN: Last echo Oct 2023: LVEF 60-65%, G2DD.  - Restarted  lasix, restart all home meds at DC   Stage IV CKD: At baseline.  - Continue home meds   AOCKD: Stable. Gets outpt Tx.    HLD:  - Continue statin   GERD:  - PPI  Consultants: Surgery Procedures performed: None  Disposition: Home Diet recommendation:  Cardiac diet DISCHARGE MEDICATION: Allergies as of 04/20/2023   No Known Allergies      Medication List     TAKE these medications    albuterol 108 (90 Base) MCG/ACT inhaler Commonly known as: VENTOLIN HFA Inhale 2 puffs into the lungs every 4 (four) hours as needed.   allopurinol 100 MG tablet Commonly known as: ZYLOPRIM Take 1 tablet (100 mg total) by mouth daily.   amLODipine 5 MG tablet Commonly known as: NORVASC Take 5 mg by mouth daily.   apixaban 5 MG Tabs tablet Commonly known as: ELIQUIS Take 2 tablets (10 mg total) by mouth 2 (two) times daily. On 08/27/22, start 5 mg  (1 tab) two times daily   atorvastatin 40 MG tablet Commonly known as: LIPITOR Take 1 tablet (40 mg total) by mouth daily.   bisoprolol 5 MG tablet Commonly known as: ZEBETA Take 1 tablet (5 mg total) by mouth daily.   epoetin alfa 3000 UNIT/ML injection Commonly known as: EPOGEN Inject into the skin.   FeroSul 325 (65 FE) MG tablet Generic drug: ferrous sulfate TAKE ONE TABLET BY MOUTH EVERY MORNING WITH BREAKFAST   furosemide 40 MG tablet Commonly known as: LASIX Take 1 tablet (40 mg total) by mouth daily. What changed:  how much to take how to take this when to  take this additional instructions   hydrALAZINE 25 MG tablet Commonly known as: APRESOLINE Take 1 tablet (25 mg total) by mouth every 6 (six) hours.   ipratropium-albuterol 0.5-2.5 (3) MG/3ML Soln Commonly known as: DUONEB Take 3 mLs by nebulization every 6 (six) hours as needed.   multivitamin with minerals Tabs tablet Take 1 tablet by mouth daily.   pantoprazole 40 MG tablet Commonly known as: PROTONIX Take 1 tablet (40 mg total) by mouth 2 (two) times  daily before a meal. What changed: when to take this   potassium chloride 10 MEQ tablet Commonly known as: KLOR-CON Take 10 mEq by mouth daily.   Retacrit 3000 UNIT/ML injection Generic drug: epoetin alfa-epbx Inject 3,000 Units into the vein every 14 (fourteen) days.   Trulicity 0.75 MG/0.5ML Sopn Generic drug: Dulaglutide Inject 0.75 mg into the skin every Friday.        Follow-up Information     Lovey Newcomer, PA Follow up.   Specialty: Physician Assistant Contact information: [address] [place] [address] [phone]                Discharge Exam: Ceasar Mons Weights   04/18/23 9147 04/18/23 1825  Weight: 97 kg 96.7 kg  No distress Clear, nonlabored on home 2L O2 RRR, no edema Protuberant with active bowel sounds, no tenderness, upper large reducible hernia, surgical scars prominent.   Condition at discharge: good  The results of significant diagnostics from this hospitalization (including imaging, microbiology, ancillary and laboratory) are listed below for reference.   Imaging Studies: DG Abd Portable 1V-Small Bowel Obstruction Protocol-initial, 8 hr delay  Result Date: 04/19/2023 CLINICAL DATA:  NG tube placement, 8 hour delayed images EXAM: PORTABLE ABDOMEN - 1 VIEW COMPARISON:  CT done on 04/18/2023 FINDINGS: Tip of enteric tube is seen in the region of the antrum of the stomach. There is mild to moderate gaseous distention of proximal small bowel loops. Oral contrast has reached the colon and rectum. There is no evidence of any significant amount of residual contrast in small bowel. Multiple diverticula seen in colon. There are 2 linear metallic densities in the region of the stomach. There is another linear metallic density in right mid abdomen, possibly within a small bowel loop. IMPRESSION: Oral contrast has reached the colon and rectum without significant residual contrast in small bowel loops. There is mild to moderate gaseous distention of few  small bowel loops suggesting ileus or residual partial obstruction. Tip of NG tube is in the distal antrum of the stomach. There are 2 linear metallic structures in the stomach and his single linear metallic structure in right mid abdomen, possibly within a small bowel loop. Please correlate with clinical history for any monitoring devices. Electronically Signed   By: Ernie Avena M.D.   On: 04/19/2023 20:55   DG Chest Port 1 View  Result Date: 04/18/2023 CLINICAL DATA:  Check gastric catheter placement EXAM: PORTABLE CHEST 1 VIEW COMPARISON:  None Available. FINDINGS: Gastric catheter is noted extending into the distal stomach. Cardiac shadow is enlarged. Mild central vascular prominence is noted. No focal infiltrate or effusion is seen. IMPRESSION: Gastric catheter within the stomach. Electronically Signed   By: Alcide Clever M.D.   On: 04/18/2023 22:09   CT ABDOMEN PELVIS WO CONTRAST  Result Date: 04/18/2023 CLINICAL DATA:  Abdominal pain EXAM: CT ABDOMEN AND PELVIS WITHOUT CONTRAST TECHNIQUE: Multidetector CT imaging of the abdomen and pelvis was performed following the standard protocol without IV contrast. RADIATION DOSE  REDUCTION: This exam was performed according to the departmental dose-optimization program which includes automated exposure control, adjustment of the mA and/or kV according to patient size and/or use of iterative reconstruction technique. COMPARISON:  10/17/2021 FINDINGS: Lower chest: There are few linear and pleural-based densities in the lower lung fields with no interval change. There is 6 mm subpleural nodule in right lower lobe with no interval change. Coronary artery calcifications are seen. Hepatobiliary: No focal abnormalities are seen in liver. There is no dilation of bile ducts. Gallbladder is unremarkable. There is 2 mm calcific density in the lateral margin of gallbladder. There is no wall thickening or pericholecystic fluid collection. Pancreas: No focal  abnormalities are seen. Spleen: Unremarkable. Adrenals/Urinary Tract: Adrenals are unremarkable. There is no hydronephrosis. There are few smooth myelinated lesions in both kidneys, more so on the right side suggesting renal cysts with no significant interval change. Urinary bladder is not distended. Stomach/Bowel: Small hiatal hernia is seen. There is abnormal dilation of small bowel loops measuring up to 5.1 cm in maximum diameter. There is fecalization of a small bowel loop in right lower abdomen. Zone of transition appears to be in right lower quadrant. There is no demonstrable soft tissue mass in the region of the transition. Multiple diverticula are seen in colon without signs of focal acute diverticulitis. Appendix is not visualized. Vascular/Lymphatic: Scattered calcifications are seen in aorta and its major branches. Reproductive: Prostate is enlarged. Other: There is no ascites or pneumoperitoneum. There is evidence of previous ventral hernia repair. Small bilateral inguinal hernias containing fat are noted. Musculoskeletal: Degenerative changes are noted in lumbar spine, more so at L4-L5 and L5-S1 levels. IMPRESSION: There is marked dilation of proximal small bowel loops and decompression of distal small bowel loops consistent with high-grade partial small bowel obstruction, possibly caused by ideations. Zone of transition is noted in right lower quadrant. There is no hydronephrosis. Diverticulosis of colon without signs of focal diverticulitis. There is 2 mm calcific density in the margin of the gallbladder which may suggest gallbladder stone adherent to the mucosa are calcified granuloma in the wall of the gallbladder. Aortic arteriosclerosis. Coronary artery disease. Small hiatal hernia. Renal cysts. Lumbar spondylosis. Other findings as described in the body of the report. Electronically Signed   By: Ernie Avena M.D.   On: 04/18/2023 15:46    Microbiology: Results for orders placed or  performed during the hospital encounter of 10/20/22  Resp panel by RT-PCR (RSV, Flu A&B, Covid) Anterior Nasal Swab     Status: None   Collection Time: 10/20/22  9:08 AM   Specimen: Anterior Nasal Swab  Result Value Ref Range Status   SARS Coronavirus 2 by RT PCR NEGATIVE NEGATIVE Final    Comment: (NOTE) SARS-CoV-2 target nucleic acids are NOT DETECTED.  The SARS-CoV-2 RNA is generally detectable in upper respiratory specimens during the acute phase of infection. The lowest concentration of SARS-CoV-2 viral copies this assay can detect is 138 copies/mL. A negative result does not preclude SARS-Cov-2 infection and should not be used as the sole basis for treatment or other patient management decisions. A negative result may occur with  improper specimen collection/handling, submission of specimen other than nasopharyngeal swab, presence of viral mutation(s) within the areas targeted by this assay, and inadequate number of viral copies(<138 copies/mL). A negative result must be combined with clinical observations, patient history, and epidemiological information. The expected result is Negative.  Fact Sheet for Patients:  BloggerCourse.com  Fact Sheet for Healthcare Providers:  SeriousBroker.it  This test is no t yet approved or cleared by the [place] and  has been authorized for detection and/or diagnosis of SARS-CoV-2 by FDA under an Emergency Use Authorization (EUA). This EUA will remain  in effect (meaning this test can be used) for the duration of the COVID-19 declaration under Section 564(b)(1) of the Act, 21 U.S.C.section 360bbb-3(b)(1), unless the authorization is terminated  or revoked sooner.       Influenza A by PCR NEGATIVE NEGATIVE Final   Influenza B by PCR NEGATIVE NEGATIVE Final    Comment: (NOTE) The Xpert Xpress SARS-CoV-2/FLU/RSV plus assay is intended as an aid in the diagnosis of influenza from  Nasopharyngeal swab specimens and should not be used as a sole basis for treatment. Nasal washings and aspirates are unacceptable for Xpert Xpress SARS-CoV-2/FLU/RSV testing.  Fact Sheet for Patients: BloggerCourse.com  Fact Sheet for Healthcare Providers: SeriousBroker.it  This test is not yet approved or cleared by the [place] FDA and has been authorized for detection and/or diagnosis of SARS-CoV-2 by FDA under an Emergency Use Authorization (EUA). This EUA will remain in effect (meaning this test can be used) for the duration of the COVID-19 declaration under Section 564(b)(1) of the Act, 21 U.S.C. section 360bbb-3(b)(1), unless the authorization is terminated or revoked.     Resp Syncytial Virus by PCR NEGATIVE NEGATIVE Final    Comment: (NOTE) Fact Sheet for Patients: BloggerCourse.com  Fact Sheet for Healthcare Providers: SeriousBroker.it  This test is not yet approved or cleared by the [place] FDA and has been authorized for detection and/or diagnosis of SARS-CoV-2 by FDA under an Emergency Use Authorization (EUA). This EUA will remain in effect (meaning this test can be used) for the duration of the COVID-19 declaration under Section 564(b)(1) of the Act, 21 U.S.C. section 360bbb-3(b)(1), unless the authorization is terminated or revoked.  Performed at [facility], [address] [place], [place] [address]   MRSA Next Gen by PCR, Nasal     Status: None   Collection Time: 10/23/22 10:15 AM   Specimen: Nasal Mucosa; Nasal Swab  Result Value Ref Range Status   MRSA by PCR Next Gen NOT DETECTED NOT DETECTED Final    Comment: (NOTE) The GeneXpert MRSA Assay (FDA approved for NASAL specimens only), is one component of a comprehensive MRSA colonization surveillance program. It is not intended to diagnose MRSA infection nor to guide or monitor treatment for  MRSA infections. Test performance is not FDA approved in patients less than 85 years old. Performed at [facility], [address] [place], [place] [address]     Labs: CBC: Recent Labs  Lab 04/18/23 0847 04/19/23 0400  WBC 6.4 5.3  NEUTROABS 4.8  --   HGB 10.0* 9.2*  HCT 30.4* 28.3*  MCV 84.4 86.8  PLT 172 151   Basic Metabolic Panel: Recent Labs  Lab 04/18/23 0847 04/19/23 0400 04/20/23 0918  NA 137 139 142  K 3.7 3.6 3.3*  CL 99 103 103  CO2 28 28 27   GLUCOSE 109* 83 176*  BUN 41* 44* 37*  CREATININE 2.75* 2.85* 2.80*  CALCIUM 8.7* 7.9* 8.5*   Liver Function Tests: Recent Labs  Lab 04/18/23 0847  AST 17  ALT 14  ALKPHOS 44  BILITOT 0.9  PROT 6.8  ALBUMIN 3.8   CBG: Recent Labs  Lab 04/18/23 2017 04/19/23 0747 04/19/23 1649 04/20/23 0010 04/20/23 0842  GLUCAP 103* 76 85 93 102*    Discharge time spent:  greater than 30 minutes.  Signed: Tyrone Nine, MD [facility] 04/20/2023

## 2023-04-30 DIAGNOSIS — J449 Chronic obstructive pulmonary disease, unspecified: Secondary | ICD-10-CM | POA: Diagnosis not present

## 2023-04-30 DIAGNOSIS — I10 Essential (primary) hypertension: Secondary | ICD-10-CM | POA: Diagnosis not present

## 2023-04-30 DIAGNOSIS — E782 Mixed hyperlipidemia: Secondary | ICD-10-CM | POA: Diagnosis not present

## 2023-04-30 DIAGNOSIS — K219 Gastro-esophageal reflux disease without esophagitis: Secondary | ICD-10-CM | POA: Diagnosis not present

## 2023-05-02 ENCOUNTER — Encounter (HOSPITAL_COMMUNITY)
Admission: RE | Admit: 2023-05-02 | Discharge: 2023-05-02 | Disposition: A | Discharge status: Home / Self Care | Payer: Medicare HMO | Source: Ambulatory Visit | Attending: Nephrology | Admitting: Nephrology

## 2023-05-02 VITALS — BP 139/78 | HR 71 | Temp 98.1°F | Resp 20

## 2023-05-02 DIAGNOSIS — N189 Chronic kidney disease, unspecified: Secondary | ICD-10-CM | POA: Insufficient documentation

## 2023-05-02 DIAGNOSIS — Z862 Personal history of diseases of the blood and blood-forming organs and certain disorders involving the immune mechanism: Secondary | ICD-10-CM | POA: Diagnosis present

## 2023-05-02 LAB — POCT HEMOGLOBIN-HEMACUE: Hemoglobin: 9.3 g/dL — ABNORMAL LOW (ref 13.0–17.0)

## 2023-05-02 MED ORDER — EPOETIN ALFA-EPBX 10000 UNIT/ML IJ SOLN
6000.0000 [IU] | Freq: Once | INTRAMUSCULAR | Status: AC
  Administered 2023-05-02: 6000 [IU] via SUBCUTANEOUS
  Filled 2023-05-02: qty 1

## 2023-05-02 NOTE — Addendum Note (Signed)
Encounter addended by: Denishia Citro T, RN on: 05/02/2023 3:32 PM  Actions taken: Therapy plan modified

## 2023-05-02 NOTE — Progress Notes (Addendum)
Diagnosis: anemia in chronic kidney disease   Provider:  Celso Amy MD  Procedure: Injection  Retacrit (epoetin alfa-epbx), Dose: 6000 Units, Site: subcutaneous, Number of injections: 1  Right arm    Post Care: Patient declined observation  Discharge: Condition: Good, Destination: Home . AVS Provided  Performed by:  Marlow Baars [place]Burchett, RN

## 2023-05-10 ENCOUNTER — Inpatient Hospital Stay: Payer: Medicare HMO | Attending: Hematology

## 2023-05-10 DIAGNOSIS — Z86010 Personal history of colonic polyps: Secondary | ICD-10-CM | POA: Insufficient documentation

## 2023-05-10 DIAGNOSIS — G4730 Sleep apnea, unspecified: Secondary | ICD-10-CM | POA: Diagnosis not present

## 2023-05-10 DIAGNOSIS — I129 Hypertensive chronic kidney disease with stage 1 through stage 4 chronic kidney disease, or unspecified chronic kidney disease: Secondary | ICD-10-CM | POA: Diagnosis not present

## 2023-05-10 DIAGNOSIS — Z7901 Long term (current) use of anticoagulants: Secondary | ICD-10-CM | POA: Insufficient documentation

## 2023-05-10 DIAGNOSIS — Z79899 Other long term (current) drug therapy: Secondary | ICD-10-CM | POA: Diagnosis not present

## 2023-05-10 DIAGNOSIS — I700 Atherosclerosis of aorta: Secondary | ICD-10-CM | POA: Insufficient documentation

## 2023-05-10 DIAGNOSIS — E1122 Type 2 diabetes mellitus with diabetic chronic kidney disease: Secondary | ICD-10-CM | POA: Diagnosis not present

## 2023-05-10 DIAGNOSIS — Z87891 Personal history of nicotine dependence: Secondary | ICD-10-CM | POA: Diagnosis not present

## 2023-05-10 DIAGNOSIS — K4020 Bilateral inguinal hernia, without obstruction or gangrene, not specified as recurrent: Secondary | ICD-10-CM | POA: Diagnosis not present

## 2023-05-10 DIAGNOSIS — Z86711 Personal history of pulmonary embolism: Secondary | ICD-10-CM | POA: Diagnosis not present

## 2023-05-10 DIAGNOSIS — D509 Iron deficiency anemia, unspecified: Secondary | ICD-10-CM | POA: Diagnosis not present

## 2023-05-10 DIAGNOSIS — R911 Solitary pulmonary nodule: Secondary | ICD-10-CM | POA: Diagnosis not present

## 2023-05-10 DIAGNOSIS — K648 Other hemorrhoids: Secondary | ICD-10-CM | POA: Insufficient documentation

## 2023-05-10 DIAGNOSIS — I2510 Atherosclerotic heart disease of native coronary artery without angina pectoris: Secondary | ICD-10-CM | POA: Diagnosis not present

## 2023-05-10 DIAGNOSIS — E785 Hyperlipidemia, unspecified: Secondary | ICD-10-CM | POA: Insufficient documentation

## 2023-05-10 DIAGNOSIS — K317 Polyp of stomach and duodenum: Secondary | ICD-10-CM | POA: Diagnosis not present

## 2023-05-10 DIAGNOSIS — N189 Chronic kidney disease, unspecified: Secondary | ICD-10-CM | POA: Diagnosis not present

## 2023-05-10 DIAGNOSIS — K449 Diaphragmatic hernia without obstruction or gangrene: Secondary | ICD-10-CM | POA: Diagnosis not present

## 2023-05-10 DIAGNOSIS — Z7985 Long-term (current) use of injectable non-insulin antidiabetic drugs: Secondary | ICD-10-CM | POA: Insufficient documentation

## 2023-05-10 DIAGNOSIS — D631 Anemia in chronic kidney disease: Secondary | ICD-10-CM | POA: Diagnosis not present

## 2023-05-10 DIAGNOSIS — D649 Anemia, unspecified: Secondary | ICD-10-CM | POA: Diagnosis not present

## 2023-05-10 DIAGNOSIS — K5730 Diverticulosis of large intestine without perforation or abscess without bleeding: Secondary | ICD-10-CM | POA: Insufficient documentation

## 2023-05-10 DIAGNOSIS — M47816 Spondylosis without myelopathy or radiculopathy, lumbar region: Secondary | ICD-10-CM | POA: Diagnosis not present

## 2023-05-10 DIAGNOSIS — N281 Cyst of kidney, acquired: Secondary | ICD-10-CM | POA: Insufficient documentation

## 2023-05-10 DIAGNOSIS — K219 Gastro-esophageal reflux disease without esophagitis: Secondary | ICD-10-CM | POA: Diagnosis not present

## 2023-05-10 LAB — CBC WITH DIFFERENTIAL/PLATELET
Abs Immature Granulocytes: 0.01 10*3/uL (ref 0.00–0.07)
Basophils Absolute: 0 10*3/uL (ref 0.0–0.1)
Basophils Relative: 1 %
Eosinophils Absolute: 0.2 10*3/uL (ref 0.0–0.5)
Eosinophils Relative: 3 %
HCT: 29.3 % — ABNORMAL LOW (ref 39.0–52.0)
Hemoglobin: 9.5 g/dL — ABNORMAL LOW (ref 13.0–17.0)
Immature Granulocytes: 0 %
Lymphocytes Relative: 36 %
Lymphs Abs: 1.6 10*3/uL (ref 0.7–4.0)
MCH: 27.7 pg (ref 26.0–34.0)
MCHC: 32.4 g/dL (ref 30.0–36.0)
MCV: 85.4 fL (ref 80.0–100.0)
Monocytes Absolute: 0.4 10*3/uL (ref 0.1–1.0)
Monocytes Relative: 10 %
Neutro Abs: 2.2 10*3/uL (ref 1.7–7.7)
Neutrophils Relative %: 50 %
Platelets: 176 10*3/uL (ref 150–400)
RBC: 3.43 MIL/uL — ABNORMAL LOW (ref 4.22–5.81)
RDW: 14.1 % (ref 11.5–15.5)
WBC: 4.4 10*3/uL (ref 4.0–10.5)
nRBC: 0 % (ref 0.0–0.2)

## 2023-05-10 LAB — IRON AND TIBC
Iron: 76 ug/dL (ref 45–182)
Saturation Ratios: 31 % (ref 17.9–39.5)
TIBC: 246 ug/dL — ABNORMAL LOW (ref 250–450)
UIBC: 170 ug/dL

## 2023-05-10 LAB — FERRITIN: Ferritin: 179 ng/mL (ref 24–336)

## 2023-05-15 LAB — IMMUNOFIXATION ELECTROPHORESIS
IgA: 304 mg/dL (ref 61–437)
IgG (Immunoglobin G), Serum: 853 mg/dL (ref 603–1613)
IgM (Immunoglobulin M), Srm: 127 mg/dL (ref 15–143)
Total Protein ELP: 6.2 g/dL (ref 6.0–8.5)

## 2023-05-16 ENCOUNTER — Encounter (HOSPITAL_COMMUNITY): Payer: Medicare HMO

## 2023-05-16 ENCOUNTER — Encounter (HOSPITAL_COMMUNITY)
Admission: RE | Admit: 2023-05-16 | Discharge: 2023-05-16 | Disposition: A | Discharge status: Home / Self Care | Payer: Medicare HMO | Source: Ambulatory Visit | Attending: Nephrology | Admitting: Nephrology

## 2023-05-16 ENCOUNTER — Inpatient Hospital Stay: Payer: Medicare HMO | Admitting: Oncology

## 2023-05-16 VITALS — BP 130/81 | HR 72 | Temp 98.4°F | Resp 18 | Ht 69.0 in | Wt 213.2 lb

## 2023-05-16 VITALS — BP 141/83 | HR 69 | Temp 98.7°F | Resp 18

## 2023-05-16 DIAGNOSIS — D649 Anemia, unspecified: Secondary | ICD-10-CM

## 2023-05-16 DIAGNOSIS — M47816 Spondylosis without myelopathy or radiculopathy, lumbar region: Secondary | ICD-10-CM | POA: Diagnosis not present

## 2023-05-16 DIAGNOSIS — I700 Atherosclerosis of aorta: Secondary | ICD-10-CM | POA: Diagnosis not present

## 2023-05-16 DIAGNOSIS — I2510 Atherosclerotic heart disease of native coronary artery without angina pectoris: Secondary | ICD-10-CM | POA: Diagnosis not present

## 2023-05-16 DIAGNOSIS — G4730 Sleep apnea, unspecified: Secondary | ICD-10-CM | POA: Diagnosis not present

## 2023-05-16 DIAGNOSIS — K219 Gastro-esophageal reflux disease without esophagitis: Secondary | ICD-10-CM | POA: Diagnosis not present

## 2023-05-16 DIAGNOSIS — N281 Cyst of kidney, acquired: Secondary | ICD-10-CM | POA: Diagnosis not present

## 2023-05-16 DIAGNOSIS — K317 Polyp of stomach and duodenum: Secondary | ICD-10-CM | POA: Diagnosis not present

## 2023-05-16 DIAGNOSIS — Z86010 Personal history of colonic polyps: Secondary | ICD-10-CM | POA: Diagnosis not present

## 2023-05-16 DIAGNOSIS — Z862 Personal history of diseases of the blood and blood-forming organs and certain disorders involving the immune mechanism: Secondary | ICD-10-CM | POA: Diagnosis not present

## 2023-05-16 DIAGNOSIS — D509 Iron deficiency anemia, unspecified: Secondary | ICD-10-CM | POA: Diagnosis not present

## 2023-05-16 DIAGNOSIS — R911 Solitary pulmonary nodule: Secondary | ICD-10-CM | POA: Diagnosis not present

## 2023-05-16 DIAGNOSIS — Z79899 Other long term (current) drug therapy: Secondary | ICD-10-CM | POA: Diagnosis not present

## 2023-05-16 DIAGNOSIS — K5730 Diverticulosis of large intestine without perforation or abscess without bleeding: Secondary | ICD-10-CM | POA: Diagnosis not present

## 2023-05-16 DIAGNOSIS — K648 Other hemorrhoids: Secondary | ICD-10-CM | POA: Diagnosis not present

## 2023-05-16 DIAGNOSIS — N189 Chronic kidney disease, unspecified: Secondary | ICD-10-CM | POA: Diagnosis not present

## 2023-05-16 DIAGNOSIS — K449 Diaphragmatic hernia without obstruction or gangrene: Secondary | ICD-10-CM | POA: Diagnosis not present

## 2023-05-16 DIAGNOSIS — Z86711 Personal history of pulmonary embolism: Secondary | ICD-10-CM | POA: Diagnosis not present

## 2023-05-16 DIAGNOSIS — D631 Anemia in chronic kidney disease: Secondary | ICD-10-CM | POA: Diagnosis not present

## 2023-05-16 DIAGNOSIS — K4020 Bilateral inguinal hernia, without obstruction or gangrene, not specified as recurrent: Secondary | ICD-10-CM | POA: Diagnosis not present

## 2023-05-16 DIAGNOSIS — Z87891 Personal history of nicotine dependence: Secondary | ICD-10-CM | POA: Diagnosis not present

## 2023-05-16 DIAGNOSIS — Z7901 Long term (current) use of anticoagulants: Secondary | ICD-10-CM | POA: Diagnosis not present

## 2023-05-16 DIAGNOSIS — I129 Hypertensive chronic kidney disease with stage 1 through stage 4 chronic kidney disease, or unspecified chronic kidney disease: Secondary | ICD-10-CM | POA: Diagnosis not present

## 2023-05-16 DIAGNOSIS — E1122 Type 2 diabetes mellitus with diabetic chronic kidney disease: Secondary | ICD-10-CM | POA: Diagnosis not present

## 2023-05-16 DIAGNOSIS — E785 Hyperlipidemia, unspecified: Secondary | ICD-10-CM | POA: Diagnosis not present

## 2023-05-16 LAB — POCT HEMOGLOBIN-HEMACUE: Hemoglobin: 9.5 g/dL — ABNORMAL LOW (ref 13.0–17.0)

## 2023-05-16 MED ORDER — EPOETIN ALFA-EPBX 10000 UNIT/ML IJ SOLN
6000.0000 [IU] | Freq: Once | INTRAMUSCULAR | Status: AC
  Administered 2023-05-16: 6000 [IU] via SUBCUTANEOUS

## 2023-05-16 NOTE — Progress Notes (Signed)
[facility] [address] [place] [address]   Clinic Day:  05/16/2023  Referring physician: Lovey Newcomer, PA  Patient Care Team: Lovey Newcomer, PA as PCP - General Mealor, Roberts Gaudy, MD as PCP - Electrophysiology (Cardiology) Mallipeddi, Orion Modest, MD as PCP - Cardiology (Cardiology) Randa Lynn, MD as Consulting Physician (Nephrology) Doreatha Massed, MD as Medical Oncologist (Hematology)   ASSESSMENT & PLAN:   Assessment:  1.  Normocytic anemia: - Patient seen at the request of Dr. Wolfgang Phoenix - On iron supplement daily since 2021.  No prior history of blood transfusion. - Colonoscopy (08/28/2020): Nonbleeding internal hemorrhoids, diverticulosis. - EGD (02/07/2022): Multiple gastric polyps. - Denies any bleeding per rectum or melena. - Retacrit 3000 units on 12/05/2022, 01/02/2023, increased to 6000 units on 01/24/2023.  2.  Social/family history: - Lives with wife at home.  Independent of ADLs and IADLs.  Has been on O2 via nasal cannula since December 2023.  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  Quit smoking 25 years ago. - No family history of severe anemia.  Sister has cancer but type unknown to the patient.  Plan:  1.  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 -Lab work from 05/10/2023 hemoglobin of 9.5 with a normal differential.  Ferritin 179, iron saturation 21%.  Previously folate and B12 levels were within normal range. -Electrophoresis did not reveal an M spike.  Kappa and lambda free light chains elevated without elevated kappa lambda light chain ratio.  Immunofixation was unremarkable.  UPEP was negative. -He received 2 doses of IV Venofer on 02/03/2023 and 02/10/2023. -He received Retacrit 6000 units every 2 weeks last given on 05/02/2023. -He does not need any additional IV iron at this time. -Recommend follow-up every 2 weeks with CBC only and in 8 weeks with CBC, ferritin,  iron panel and MD/APP follow-up.  PLAN SUMMARY: >> 6000 units Retacrit today. >> Return to clinic every 2 weeks for CBC plus or minus Retacrit. >> Return to clinic in 8 weeks for follow-up with labs/APP.     No orders of the defined types were placed in this encounter.  I spent 25 minutes dedicated to the care of this patient (face-to-face and non-face-to-face) on the date of the encounter to include what is described in the assessment and plan.  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 male presenting to clinic today for evaluation of anemia at the request of Dr. Wolfgang Phoenix.  Today, he states that he is doing well overall. His appetite level is at 100%. His energy level is at 70%.  He has a longstanding history of CKD, followed by nephrology.  He continues every 2 weeks Retacrit injections.  Tolerating these well.  Denies any bleeding.  Reports no change in his energy level since receiving IV iron in April.  Denies any unintentional weight loss, no change in appetite.  Continues to wear his oxygen 24/7.  He is with his wife today.  Uses a wheelchair for long distance travel. Walks independently at home.    PAST MEDICAL HISTORY:   Past Medical History: Past Medical History:  Diagnosis Date   Anemia    Borderline diabetic    Chronic kidney disease    Diabetes mellitus without complication (HCC)  Duodenal adenoma    2021   GERD (gastroesophageal reflux disease)    Gout    Gouty arthritis    H/O small bowel obstruction    History of back surgery    Hx of adenomatous colonic polyps    2021   Hyperlipidemia    Hypertension    PE (pulmonary embolism)    Sleep apnea     Surgical History: Past Surgical History:  Procedure Laterality Date   ABDOMINAL SURGERY  2103   Smal bowel obstruction    ABDOMINAL SURGERY  1981    Perforated large intestine   back     Lower back   BIOPSY  08/28/2020   Procedure: BIOPSY;  Surgeon: Dolores Frame, MD;  Location: [facility];  Service: Gastroenterology;;   BIOPSY  02/07/2022   Procedure: BIOPSY;  Surgeon: Lemar Lofty., MD;  Location: [facility];  Service: Gastroenterology;;   CATARACT EXTRACTION W/PHACO Right 01/06/2020   Procedure: CATARACT EXTRACTION PHACO AND INTRAOCULAR LENS PLACEMENT (IOC) (CDE: 6.48);  Surgeon: Fabio Pierce, MD;  Location: [facility];  Service: Ophthalmology;  Laterality: Right;   CATARACT EXTRACTION W/PHACO Left 01/20/2020   Procedure: CATARACT EXTRACTION PHACO AND INTRAOCULAR LENS PLACEMENT (IOC);  Surgeon: Fabio Pierce, MD;  Location: [facility];  Service: Ophthalmology;  Laterality: Left;  CDE: 5.86   COLONOSCOPY WITH PROPOFOL N/A 08/28/2020   Procedure: COLONOSCOPY WITH PROPOFOL;  Surgeon: Dolores Frame, MD;  Location: [facility];  Service: Gastroenterology;  Laterality: N/A;  230   ENDOSCOPIC MUCOSAL RESECTION N/A 02/07/2022   Procedure: ENDOSCOPIC MUCOSAL RESECTION;  Surgeon: Meridee Score Netty Starring., MD;  Location: [facility];  Service: Gastroenterology;  Laterality: N/A;   ESOPHAGOGASTRODUODENOSCOPY N/A 02/07/2022   Procedure: ESOPHAGOGASTRODUODENOSCOPY (EGD);  Surgeon: Lemar Lofty., MD;  Location: [facility];  Service: Gastroenterology;  Laterality: N/A;   ESOPHAGOGASTRODUODENOSCOPY (EGD) WITH PROPOFOL N/A 08/28/2020   Procedure: ESOPHAGOGASTRODUODENOSCOPY (EGD) WITH PROPOFOL;  Surgeon: Dolores Frame, MD;  [facility]: [facility];  Service: Gastroenterology;  Laterality: N/A;   ESOPHAGOGASTRODUODENOSCOPY (EGD) WITH PROPOFOL N/A 12/14/2020   Procedure: ESOPHAGOGASTRODUODENOSCOPY (EGD) WITH PROPOFOL;  Surgeon: Meridee Score Netty Starring., MD;  Location: [facility];  Service: Gastroenterology;  Laterality: N/A;   GIVENS CAPSULE STUDY N/A 09/10/2020   Procedure: GIVENS CAPSULE STUDY;   Surgeon: Dolores Frame, MD;  Location: [facility];  Service: Gastroenterology;  Laterality: N/A;  730   HEMOSTASIS CLIP PLACEMENT  12/14/2020   Procedure: HEMOSTASIS CLIP PLACEMENT;  Surgeon: Lemar Lofty., MD;  Location: [facility];  Service: Gastroenterology;;   HEMOSTASIS CLIP PLACEMENT  02/07/2022   Procedure: HEMOSTASIS CLIP PLACEMENT;  Surgeon: Lemar Lofty., MD;  Location: [facility];  Service: Gastroenterology;;   HEMOSTASIS CONTROL  12/14/2020   Procedure: HEMOSTASIS CONTROL;  Surgeon: Lemar Lofty., MD;  Location: [facility];  Service: Gastroenterology;;   HERNIA REPAIR     incisional   HOT HEMOSTASIS N/A 02/07/2022   Procedure: HOT HEMOSTASIS (ARGON PLASMA COAGULATION/BICAP);  Surgeon: Lemar Lofty., MD;  Location: [facility];  Service: Gastroenterology;  Laterality: N/A;   POLYPECTOMY  08/28/2020   Procedure: POLYPECTOMY;  Surgeon: Dolores Frame, MD;  Location: [facility];  Service: Gastroenterology;;   POLYPECTOMY  12/14/2020   Procedure: POLYPECTOMY;  Surgeon: Lemar Lofty., MD;  Location: [facility];  Service: Gastroenterology;;   Susa Day  12/14/2020   Procedure: Susa Day;  Surgeon: Meridee Score Netty Starring., MD;  Location: [facility];  Service: Gastroenterology;;   Brooke Dare INJECTION  02/07/2022  Procedure: SUBMUCOSAL LIFTING INJECTION;  Surgeon: Meridee Score Netty Starring., MD;  Location: [facility];  Service: Gastroenterology;;   SUBMUCOSAL TATTOO INJECTION  12/14/2020   Procedure: SUBMUCOSAL TATTOO INJECTION;  Surgeon: Lemar Lofty., MD;  Location: [facility];  Service: Gastroenterology;;    Social History: Social History   Socioeconomic History   Marital status: Married    Spouse name: Not on file   Number of children: 1   Years of education: Not on file   Highest education level: Not on file  Occupational History   Occupation: Finisher  Tobacco Use    Smoking status: Former    Current packs/day: 0.00    Average packs/day: 1 pack/day for 38.0 years (38.0 ttl pk-yrs)    Types: Cigarettes    Start date: 02/06/1957    Quit date: 02/07/1995    Years since quitting: 28.2   Smokeless tobacco: Never  Vaping Use   Vaping status: Never Used  Substance and Sexual Activity   Alcohol use: Yes    Alcohol/week: 2.0 standard drinks of alcohol    Types: 2 Shots of liquor per week    Comment: occasional    Drug use: Yes    Types: Marijuana   Sexual activity: Yes  Other Topics Concern   Not on file  Social History Narrative   Lives with wife.     Social Determinants of Health   Financial Resource Strain: Not on file  Food Insecurity: No Food Insecurity (01/31/2023)   Hunger Vital Sign    Worried About Running Out of Food in the Last Year: Never true    Ran Out of Food in the Last Year: Never true  Transportation Needs: No Transportation Needs (01/31/2023)   PRAPARE - Administrator, Civil Service (Medical): No    Lack of Transportation (Non-Medical): No  Physical Activity: Not on file  Stress: Not on file  Social Connections: Not on file  Intimate Partner Violence: Not At Risk (01/31/2023)   Humiliation, Afraid, Rape, and Kick questionnaire    Fear of Current or Ex-Partner: No    Emotionally Abused: No    Physically Abused: No    Sexually Abused: No    Family History: Family History  Problem Relation Age of Onset   Hypertension Sister    Diabetes Sister    Diabetes Sister    Hypertension Mother    Diabetes Mother    Hypertension Father    Ulcers Father    Colon cancer Neg Hx    Esophageal cancer Neg Hx    Stomach cancer Neg Hx    Inflammatory bowel disease Neg Hx    Liver disease Neg Hx    Pancreatic cancer Neg Hx    Rectal cancer Neg Hx     Current Medications:  Current Outpatient Medications:    albuterol (VENTOLIN HFA) 108 (90 Base) MCG/ACT inhaler, Inhale 2 puffs into the lungs every 4 (four) hours as  needed., Disp: , Rfl:    allopurinol (ZYLOPRIM) 100 MG tablet, Take 1 tablet (100 mg total) by mouth daily., Disp: 90 tablet, Rfl: 1   amLODipine (NORVASC) 5 MG tablet, Take 5 mg by mouth daily., Disp: , Rfl:    apixaban (ELIQUIS) 5 MG TABS tablet, Take 2 tablets (10 mg total) by mouth 2 (two) times daily. On 08/27/22, start 5 mg  (1 tab) two times daily, Disp: 74 tablet, Rfl: 1   atorvastatin (LIPITOR) 40 MG tablet, Take 1 tablet (40 mg total) by mouth daily., Disp:  90 tablet, Rfl: 1   bisoprolol (ZEBETA) 5 MG tablet, Take 1 tablet (5 mg total) by mouth daily., Disp: 90 tablet, Rfl: 3   epoetin alfa (EPOGEN) 3000 UNIT/ML injection, Inject into the skin., Disp: , Rfl:    epoetin alfa-epbx (RETACRIT) 3000 UNIT/ML injection, Inject 3,000 Units into the vein every 14 (fourteen) days., Disp: , Rfl:    FEROSUL 325 (65 Fe) MG tablet, TAKE ONE TABLET BY MOUTH EVERY MORNING WITH BREAKFAST, Disp: 90 tablet, Rfl: 3   furosemide (LASIX) 40 MG tablet, Take 1 tablet (40 mg total) by mouth daily. (Patient taking differently: Take 2 tabs (80mg ) by mouth every morning & 1 tab (40mg ) every evening), Disp: 30 tablet, Rfl: 1   hydrALAZINE (APRESOLINE) 25 MG tablet, Take 1 tablet (25 mg total) by mouth every 6 (six) hours., Disp: 90 tablet, Rfl: 1   ipratropium-albuterol (DUONEB) 0.5-2.5 (3) MG/3ML SOLN, Take 3 mLs by nebulization every 6 (six) hours as needed., Disp: 360 mL, Rfl: 5   Multiple Vitamin (MULTIVITAMIN WITH MINERALS) TABS tablet, Take 1 tablet by mouth daily., Disp:  , Rfl:    pantoprazole (PROTONIX) 40 MG tablet, Take 1 tablet (40 mg total) by mouth 2 (two) times daily before a meal. (Patient taking differently: Take 40 mg by mouth daily.), Disp: 60 tablet, Rfl: 12   potassium chloride (KLOR-CON) 10 MEQ tablet, Take 10 mEq by mouth daily., Disp: , Rfl:    TRULICITY 0.75 MG/0.5ML SOPN, Inject 0.75 mg into the skin every Friday., Disp: , Rfl:    Allergies: No Known Allergies  REVIEW OF SYSTEMS:    Review of Systems  Constitutional: Negative.  Negative for appetite change, chills, fatigue and fever.  HENT:  Negative.  Negative for hearing loss, lump/mass, mouth sores and nosebleeds.   Eyes: Negative.  Negative for eye problems.  Respiratory:  Positive for shortness of breath (on Oxygen). Negative for cough and hemoptysis.   Cardiovascular: Negative.  Negative for chest pain and leg swelling.  Gastrointestinal: Negative.  Negative for abdominal pain, blood in stool, constipation, diarrhea, nausea and vomiting.  Endocrine: Negative.  Negative for hot flashes.  Genitourinary: Negative.  Negative for bladder incontinence, difficulty urinating, dysuria, frequency and hematuria.   Musculoskeletal:  Positive for arthralgias (Shoulders), back pain and gait problem. Negative for flank pain and myalgias.  Skin: Negative.  Negative for itching and rash.  Neurological:  Positive for gait problem. Negative for dizziness, headaches, light-headedness and numbness.  Hematological: Negative.  Negative for adenopathy.  Psychiatric/Behavioral:  Negative for confusion. The patient is not nervous/anxious.      VITALS:   Blood pressure 130/81, pulse 72, temperature 98.4 F (36.9 C), temperature source Oral, resp. rate 18, height 5\' 9"  (1.753 m), weight 213 lb 3.2 oz (96.7 kg), SpO2 99%.  Wt Readings from Last 3 Encounters:  05/16/23 213 lb 3.2 oz (96.7 kg)  04/18/23 213 lb 1.6 oz (96.7 kg)  04/11/23 216 lb (98 kg)    Body mass index is 31.48 kg/m.   PHYSICAL EXAM:   Physical Exam Constitutional:      Appearance: Normal appearance.  HENT:     Head: Normocephalic and atraumatic.  Eyes:     Pupils: Pupils are equal, round, and reactive to light.  Cardiovascular:     Rate and Rhythm: Normal rate and regular rhythm.     Heart sounds: Normal heart sounds. No murmur heard. Pulmonary:     Effort: Pulmonary effort is normal.     Breath sounds: Normal  breath sounds. No wheezing.  Abdominal:      General: Bowel sounds are normal. There is no distension.     Palpations: Abdomen is soft.     Tenderness: There is no abdominal tenderness.  Musculoskeletal:        General: Normal range of motion.     Cervical back: Normal range of motion.  Skin:    General: Skin is warm and dry.     Findings: No rash.  Neurological:     Mental Status: He is alert and oriented to person, place, and time.  Psychiatric:        Judgment: Judgment normal.     LABS:      Latest Ref Rng & Units 05/10/2023   10:50 AM 05/02/2023    3:07 PM 04/19/2023    4:00 AM  CBC  WBC 4.0 - 10.5 K/uL 4.4   5.3   Hemoglobin 13.0 - 17.0 g/dL 9.5  9.3  9.2   Hematocrit 39.0 - 52.0 % 29.3   28.3   Platelets 150 - 400 K/uL 176   151       Latest Ref Rng & Units 04/20/2023    9:18 AM 04/19/2023    4:00 AM 04/18/2023    8:47 AM  CMP  Glucose 70 - 99 mg/dL 324  83  401   BUN 8 - 23 mg/dL 37  44  41   Creatinine 0.61 - 1.24 mg/dL 0.27  2.53  6.64   Sodium 135 - 145 mmol/L 142  139  137   Potassium 3.5 - 5.1 mmol/L 3.3  3.6  3.7   Chloride 98 - 111 mmol/L 103  103  99   CO2 22 - 32 mmol/L 27  28  28    Calcium 8.9 - 10.3 mg/dL 8.5  7.9  8.7   Total Protein 6.5 - 8.1 g/dL   6.8   Total Bilirubin 0.3 - 1.2 mg/dL   0.9   Alkaline Phos 38 - 126 U/L   44   AST 15 - 41 U/L   17   ALT 0 - 44 U/L   14      No results found for: "CEA1", "CEA" / No results found for: "CEA1", "CEA" No results found for: "PSA1" No results found for: "CAN199" No results found for: "CAN125"  Lab Results  Component Value Date   TOTALPROTELP 6.2 05/10/2023   ALBUMINELP 3.8 01/31/2023   A1GS 0.2 01/31/2023   A2GS 0.6 01/31/2023   BETS 1.0 01/31/2023   GAMS 0.9 01/31/2023   MSPIKE Not Observed 01/31/2023   SPEI Comment 01/31/2023   Lab Results  Component Value Date   TIBC 246 (L) 05/10/2023   TIBC 252 03/31/2023   TIBC 299 01/13/2023   FERRITIN 179 05/10/2023   FERRITIN 413 (H) 03/31/2023   FERRITIN 43 01/13/2023   IRONPCTSAT 31  05/10/2023   IRONPCTSAT 24 03/31/2023   IRONPCTSAT 26 01/13/2023   Lab Results  Component Value Date   LDH 168 01/31/2023     STUDIES:   DG Abd Portable 1V-Small Bowel Obstruction Protocol-initial, 8 hr delay  Result Date: 04/19/2023 CLINICAL DATA:  NG tube placement, 8 hour delayed images EXAM: PORTABLE ABDOMEN - 1 VIEW COMPARISON:  CT done on 04/18/2023 FINDINGS: Tip of enteric tube is seen in the region of the antrum of the stomach. There is mild to moderate gaseous distention of proximal small bowel loops. Oral contrast has reached the colon and rectum. There is no evidence  of any significant amount of residual contrast in small bowel. Multiple diverticula seen in colon. There are 2 linear metallic densities in the region of the stomach. There is another linear metallic density in right mid abdomen, possibly within a small bowel loop. IMPRESSION: Oral contrast has reached the colon and rectum without significant residual contrast in small bowel loops. There is mild to moderate gaseous distention of few small bowel loops suggesting ileus or residual partial obstruction. Tip of NG tube is in the distal antrum of the stomach. There are 2 linear metallic structures in the stomach and his single linear metallic structure in right mid abdomen, possibly within a small bowel loop. Please correlate with clinical history for any monitoring devices. Electronically Signed   By: Ernie Avena M.D.   On: 04/19/2023 20:55   DG Chest Port 1 View  Result Date: 04/18/2023 CLINICAL DATA:  Check gastric catheter placement EXAM: PORTABLE CHEST 1 VIEW COMPARISON:  None Available. FINDINGS: Gastric catheter is noted extending into the distal stomach. Cardiac shadow is enlarged. Mild central vascular prominence is noted. No focal infiltrate or effusion is seen. IMPRESSION: Gastric catheter within the stomach. Electronically Signed   By: Alcide Clever M.D.   On: 04/18/2023 22:09   CT ABDOMEN PELVIS WO  CONTRAST  Result Date: 04/18/2023 CLINICAL DATA:  Abdominal pain EXAM: CT ABDOMEN AND PELVIS WITHOUT CONTRAST TECHNIQUE: Multidetector CT imaging of the abdomen and pelvis was performed following the standard protocol without IV contrast. RADIATION DOSE REDUCTION: This exam was performed according to the departmental dose-optimization program which includes automated exposure control, adjustment of the mA and/or kV according to patient size and/or use of iterative reconstruction technique. COMPARISON:  10/17/2021 FINDINGS: Lower chest: There are few linear and pleural-based densities in the lower lung fields with no interval change. There is 6 mm subpleural nodule in right lower lobe with no interval change. Coronary artery calcifications are seen. Hepatobiliary: No focal abnormalities are seen in liver. There is no dilation of bile ducts. Gallbladder is unremarkable. There is 2 mm calcific density in the lateral margin of gallbladder. There is no wall thickening or pericholecystic fluid collection. Pancreas: No focal abnormalities are seen. Spleen: Unremarkable. Adrenals/Urinary Tract: Adrenals are unremarkable. There is no hydronephrosis. There are few smooth myelinated lesions in both kidneys, more so on the right side suggesting renal cysts with no significant interval change. Urinary bladder is not distended. Stomach/Bowel: Small hiatal hernia is seen. There is abnormal dilation of small bowel loops measuring up to 5.1 cm in maximum diameter. There is fecalization of a small bowel loop in right lower abdomen. Zone of transition appears to be in right lower quadrant. There is no demonstrable soft tissue mass in the region of the transition. Multiple diverticula are seen in colon without signs of focal acute diverticulitis. Appendix is not visualized. Vascular/Lymphatic: Scattered calcifications are seen in aorta and its major branches. Reproductive: Prostate is enlarged. Other: There is no ascites or  pneumoperitoneum. There is evidence of previous ventral hernia repair. Small bilateral inguinal hernias containing fat are noted. Musculoskeletal: Degenerative changes are noted in lumbar spine, more so at L4-L5 and L5-S1 levels. IMPRESSION: There is marked dilation of proximal small bowel loops and decompression of distal small bowel loops consistent with high-grade partial small bowel obstruction, possibly caused by ideations. Zone of transition is noted in right lower quadrant. There is no hydronephrosis. Diverticulosis of colon without signs of focal diverticulitis. There is 2 mm calcific density in the margin of the  gallbladder which may suggest gallbladder stone adherent to the mucosa are calcified granuloma in the wall of the gallbladder. Aortic arteriosclerosis. Coronary artery disease. Small hiatal hernia. Renal cysts. Lumbar spondylosis. Other findings as described in the body of the report. Electronically Signed   By: Ernie Avena M.D.   On: 04/18/2023 15:46

## 2023-05-18 ENCOUNTER — Inpatient Hospital Stay: Payer: Medicare HMO | Admitting: Hematology

## 2023-05-18 ENCOUNTER — Other Ambulatory Visit: Payer: Medicare HMO

## 2023-05-23 ENCOUNTER — Ambulatory Visit (HOSPITAL_COMMUNITY)
Admission: RE | Admit: 2023-05-23 | Discharge: 2023-05-23 | Disposition: A | Discharge status: Home / Self Care | Payer: Medicare HMO | Source: Ambulatory Visit | Attending: Adult Health | Admitting: Adult Health

## 2023-05-23 DIAGNOSIS — I700 Atherosclerosis of aorta: Secondary | ICD-10-CM | POA: Diagnosis not present

## 2023-05-23 DIAGNOSIS — R911 Solitary pulmonary nodule: Secondary | ICD-10-CM | POA: Diagnosis not present

## 2023-05-23 DIAGNOSIS — R918 Other nonspecific abnormal finding of lung field: Secondary | ICD-10-CM | POA: Diagnosis not present

## 2023-05-26 DIAGNOSIS — I5032 Chronic diastolic (congestive) heart failure: Secondary | ICD-10-CM | POA: Diagnosis not present

## 2023-05-26 DIAGNOSIS — E1122 Type 2 diabetes mellitus with diabetic chronic kidney disease: Secondary | ICD-10-CM | POA: Diagnosis not present

## 2023-05-26 DIAGNOSIS — I2699 Other pulmonary embolism without acute cor pulmonale: Secondary | ICD-10-CM | POA: Diagnosis not present

## 2023-05-26 DIAGNOSIS — K56600 Partial intestinal obstruction, unspecified as to cause: Secondary | ICD-10-CM | POA: Diagnosis not present

## 2023-05-26 DIAGNOSIS — Z6832 Body mass index (BMI) 32.0-32.9, adult: Secondary | ICD-10-CM | POA: Diagnosis not present

## 2023-05-26 DIAGNOSIS — N189 Chronic kidney disease, unspecified: Secondary | ICD-10-CM | POA: Diagnosis not present

## 2023-05-28 ENCOUNTER — Telehealth: Payer: Self-pay | Admitting: Pharmacy Technician

## 2023-05-28 NOTE — Telephone Encounter (Addendum)
Auth Submission: approved Site of care: Site of care: [facility] Payer: [employer] MED[employer]ARE Medication & CPT/J Code(s) submitted: RETACIT  Route of submission (phone, fax, portal): PORTAL/AVALITY Phone # Fax  Auth type: Buy/Bill PB Units/visits requested: 6000 UNITS Q2WKS Reference number: Reference Number [id number] Transaction ID: [id number] Customer ID: [id number] Approval from:  05/28/23 - 08/20/23

## 2023-05-29 ENCOUNTER — Encounter (HOSPITAL_COMMUNITY): Payer: Self-pay | Admitting: Nephrology

## 2023-05-29 ENCOUNTER — Encounter: Payer: Self-pay | Admitting: Hematology

## 2023-05-29 ENCOUNTER — Other Ambulatory Visit: Payer: Self-pay | Admitting: Physician Assistant

## 2023-05-29 NOTE — Progress Notes (Addendum)
Andrew Adams is an established patient who is following with our office for normocytic anemia related to CKD and iron deficiency.  Per note by NP Durenda Hurt on 05/16/2023, we will continue treatment plan of Procrit 6000 units every 2 weeks.

## 2023-05-29 NOTE — Addendum Note (Signed)
Addended by: Rojelio Brenner on: 05/29/2023 03:18 PM   Modules accepted: Orders

## 2023-05-30 ENCOUNTER — Encounter (HOSPITAL_COMMUNITY): Payer: Medicare HMO

## 2023-05-30 ENCOUNTER — Inpatient Hospital Stay: Payer: Medicare HMO

## 2023-05-30 VITALS — BP 143/85 | HR 82 | Temp 98.3°F | Resp 20

## 2023-05-30 DIAGNOSIS — N189 Chronic kidney disease, unspecified: Secondary | ICD-10-CM | POA: Diagnosis not present

## 2023-05-30 DIAGNOSIS — G4730 Sleep apnea, unspecified: Secondary | ICD-10-CM | POA: Diagnosis not present

## 2023-05-30 DIAGNOSIS — K648 Other hemorrhoids: Secondary | ICD-10-CM | POA: Diagnosis not present

## 2023-05-30 DIAGNOSIS — N281 Cyst of kidney, acquired: Secondary | ICD-10-CM | POA: Diagnosis not present

## 2023-05-30 DIAGNOSIS — K449 Diaphragmatic hernia without obstruction or gangrene: Secondary | ICD-10-CM | POA: Diagnosis not present

## 2023-05-30 DIAGNOSIS — D649 Anemia, unspecified: Secondary | ICD-10-CM | POA: Diagnosis not present

## 2023-05-30 DIAGNOSIS — Z7901 Long term (current) use of anticoagulants: Secondary | ICD-10-CM | POA: Diagnosis not present

## 2023-05-30 DIAGNOSIS — K219 Gastro-esophageal reflux disease without esophagitis: Secondary | ICD-10-CM | POA: Diagnosis not present

## 2023-05-30 DIAGNOSIS — Z79899 Other long term (current) drug therapy: Secondary | ICD-10-CM | POA: Diagnosis not present

## 2023-05-30 DIAGNOSIS — R911 Solitary pulmonary nodule: Secondary | ICD-10-CM | POA: Diagnosis not present

## 2023-05-30 DIAGNOSIS — D509 Iron deficiency anemia, unspecified: Secondary | ICD-10-CM

## 2023-05-30 DIAGNOSIS — I2510 Atherosclerotic heart disease of native coronary artery without angina pectoris: Secondary | ICD-10-CM | POA: Diagnosis not present

## 2023-05-30 DIAGNOSIS — Z87891 Personal history of nicotine dependence: Secondary | ICD-10-CM | POA: Diagnosis not present

## 2023-05-30 DIAGNOSIS — K4020 Bilateral inguinal hernia, without obstruction or gangrene, not specified as recurrent: Secondary | ICD-10-CM | POA: Diagnosis not present

## 2023-05-30 DIAGNOSIS — E1122 Type 2 diabetes mellitus with diabetic chronic kidney disease: Secondary | ICD-10-CM | POA: Diagnosis not present

## 2023-05-30 DIAGNOSIS — Z86711 Personal history of pulmonary embolism: Secondary | ICD-10-CM | POA: Diagnosis not present

## 2023-05-30 DIAGNOSIS — N184 Chronic kidney disease, stage 4 (severe): Secondary | ICD-10-CM

## 2023-05-30 DIAGNOSIS — I700 Atherosclerosis of aorta: Secondary | ICD-10-CM | POA: Diagnosis not present

## 2023-05-30 DIAGNOSIS — M47816 Spondylosis without myelopathy or radiculopathy, lumbar region: Secondary | ICD-10-CM | POA: Diagnosis not present

## 2023-05-30 DIAGNOSIS — K317 Polyp of stomach and duodenum: Secondary | ICD-10-CM | POA: Diagnosis not present

## 2023-05-30 DIAGNOSIS — E785 Hyperlipidemia, unspecified: Secondary | ICD-10-CM | POA: Diagnosis not present

## 2023-05-30 DIAGNOSIS — D631 Anemia in chronic kidney disease: Secondary | ICD-10-CM | POA: Diagnosis not present

## 2023-05-30 DIAGNOSIS — Z86010 Personal history of colonic polyps: Secondary | ICD-10-CM | POA: Diagnosis not present

## 2023-05-30 DIAGNOSIS — K5730 Diverticulosis of large intestine without perforation or abscess without bleeding: Secondary | ICD-10-CM | POA: Diagnosis not present

## 2023-05-30 DIAGNOSIS — I129 Hypertensive chronic kidney disease with stage 1 through stage 4 chronic kidney disease, or unspecified chronic kidney disease: Secondary | ICD-10-CM | POA: Diagnosis not present

## 2023-05-30 LAB — CBC WITH DIFFERENTIAL/PLATELET
Abs Immature Granulocytes: 0.01 10*3/uL (ref 0.00–0.07)
Basophils Absolute: 0 10*3/uL (ref 0.0–0.1)
Basophils Relative: 1 %
Eosinophils Absolute: 0.2 10*3/uL (ref 0.0–0.5)
Eosinophils Relative: 4 %
HCT: 31.2 % — ABNORMAL LOW (ref 39.0–52.0)
Hemoglobin: 10.3 g/dL — ABNORMAL LOW (ref 13.0–17.0)
Immature Granulocytes: 0 %
Lymphocytes Relative: 31 %
Lymphs Abs: 1.3 10*3/uL (ref 0.7–4.0)
MCH: 28.3 pg (ref 26.0–34.0)
MCHC: 33 g/dL (ref 30.0–36.0)
MCV: 85.7 fL (ref 80.0–100.0)
Monocytes Absolute: 0.4 10*3/uL (ref 0.1–1.0)
Monocytes Relative: 10 %
Neutro Abs: 2.3 10*3/uL (ref 1.7–7.7)
Neutrophils Relative %: 54 %
Platelets: 145 10*3/uL — ABNORMAL LOW (ref 150–400)
RBC: 3.64 MIL/uL — ABNORMAL LOW (ref 4.22–5.81)
RDW: 14.7 % (ref 11.5–15.5)
WBC: 4.1 10*3/uL (ref 4.0–10.5)
nRBC: 0 % (ref 0.0–0.2)

## 2023-05-30 MED ORDER — EPOETIN ALFA 3000 UNIT/ML IJ SOLN
6000.0000 [IU] | Freq: Once | INTRAMUSCULAR | Status: AC
  Administered 2023-05-30: 6000 [IU] via SUBCUTANEOUS
  Filled 2023-05-30: qty 2

## 2023-05-30 MED ORDER — EPOETIN ALFA 10000 UNIT/ML IJ SOLN: 6000.0000 [IU] | Freq: Once | INTRAMUSCULAR | Status: DC

## 2023-05-30 NOTE — Addendum Note (Signed)
Addended by: Harrel Lemon on: 05/30/2023 03:26 PM   Modules accepted: Orders

## 2023-05-30 NOTE — Addendum Note (Signed)
Addended by: Pryor Ochoa E on: 05/30/2023 03:04 PM   Modules accepted: Orders

## 2023-05-30 NOTE — Patient Instructions (Signed)
[facility]  Discharge Instructions: Thank you for choosing [facility] to provide your oncology and hematology care.  If you have a lab appointment with the Cancer Center - please note that after April 8th, 2024, all labs will be drawn in the cancer center.  You do not have to check in or register with the main entrance as you have in the past but will complete your check-in in the cancer center.  Wear comfortable clothing and clothing appropriate for easy access to any Portacath or PICC line.   We strive to give you quality time with your provider. You may need to reschedule your appointment if you arrive late (15 or more minutes).  Arriving late affects you and other patients whose appointments are after yours.  Also, if you miss three or more appointments without notifying the office, you may be dismissed from the clinic at the provider's discretion.      For prescription refill requests, have your pharmacy contact our office and allow 72 hours for refills to be completed.    Today you received the following, retacrit injection   To help prevent nausea and vomiting after your treatment, we encourage you to take your nausea medication as directed.  BELOW ARE SYMPTOMS THAT SHOULD BE REPORTED IMMEDIATELY: *FEVER GREATER THAN 100.4 F (38 C) OR HIGHER *CHILLS OR SWEATING *NAUSEA AND VOMITING THAT IS NOT CONTROLLED WITH YOUR NAUSEA MEDICATION *UNUSUAL SHORTNESS OF BREATH *UNUSUAL BRUISING OR BLEEDING *URINARY PROBLEMS (pain or burning when urinating, or frequent urination) *BOWEL PROBLEMS (unusual diarrhea, constipation, pain near the anus) TENDERNESS IN MOUTH AND THROAT WITH OR WITHOUT PRESENCE OF ULCERS (sore throat, sores in mouth, or a toothache) UNUSUAL RASH, SWELLING OR PAIN  UNUSUAL VAGINAL DISCHARGE OR ITCHING   Items with * indicate a potential emergency and should be followed up as soon as possible or go to the Emergency Department if any  problems should occur.  Please show the CHEMOTHERAPY ALERT CARD or IMMUNOTHERAPY ALERT CARD at check-in to the Emergency Department and triage nurse.  Should you have questions after your visit or need to cancel or reschedule your appointment, please contact [facility] AT [facility] [phone]  and follow the prompts.  Office hours are 8:00 a.m. to 4:30 p.m. Monday - Friday. Please note that voicemails left after 4:00 p.m. may not be returned until the following business day.  We are closed weekends and major holidays. You have access to a nurse at all times for urgent questions. Please call the main number to the clinic [phone] and follow the prompts.  For any non-urgent questions, you may also contact your provider using MyChart. We now offer e-Visits for anyone 49 and older to request care online for non-urgent symptoms. For details visit [email or web address].   Also download the MyChart app! Go to the app store, search "MyChart", open the app, select [facility], and log in with your MyChart username and password.

## 2023-05-30 NOTE — Progress Notes (Signed)
Retacrit injection given per orders. Patient tolerated it well without problems. Vitals stable and discharged home from clinic via wheelchair. Follow up as scheduled.  

## 2023-05-31 ENCOUNTER — Other Ambulatory Visit: Payer: Self-pay

## 2023-05-31 ENCOUNTER — Inpatient Hospital Stay: Payer: Medicare HMO | Admitting: Hematology

## 2023-05-31 DIAGNOSIS — N184 Chronic kidney disease, stage 4 (severe): Secondary | ICD-10-CM

## 2023-05-31 DIAGNOSIS — D509 Iron deficiency anemia, unspecified: Secondary | ICD-10-CM | POA: Diagnosis not present

## 2023-05-31 DIAGNOSIS — K219 Gastro-esophageal reflux disease without esophagitis: Secondary | ICD-10-CM | POA: Diagnosis not present

## 2023-05-31 DIAGNOSIS — I2510 Atherosclerotic heart disease of native coronary artery without angina pectoris: Secondary | ICD-10-CM | POA: Diagnosis not present

## 2023-05-31 DIAGNOSIS — D649 Anemia, unspecified: Secondary | ICD-10-CM | POA: Diagnosis not present

## 2023-05-31 DIAGNOSIS — K449 Diaphragmatic hernia without obstruction or gangrene: Secondary | ICD-10-CM | POA: Diagnosis not present

## 2023-05-31 DIAGNOSIS — J449 Chronic obstructive pulmonary disease, unspecified: Secondary | ICD-10-CM | POA: Diagnosis not present

## 2023-05-31 DIAGNOSIS — G4730 Sleep apnea, unspecified: Secondary | ICD-10-CM | POA: Diagnosis not present

## 2023-05-31 DIAGNOSIS — M47816 Spondylosis without myelopathy or radiculopathy, lumbar region: Secondary | ICD-10-CM | POA: Diagnosis not present

## 2023-05-31 DIAGNOSIS — R911 Solitary pulmonary nodule: Secondary | ICD-10-CM | POA: Diagnosis not present

## 2023-05-31 DIAGNOSIS — Z86010 Personal history of colonic polyps: Secondary | ICD-10-CM | POA: Diagnosis not present

## 2023-05-31 DIAGNOSIS — K5730 Diverticulosis of large intestine without perforation or abscess without bleeding: Secondary | ICD-10-CM | POA: Diagnosis not present

## 2023-05-31 DIAGNOSIS — K648 Other hemorrhoids: Secondary | ICD-10-CM | POA: Diagnosis not present

## 2023-05-31 DIAGNOSIS — Z7901 Long term (current) use of anticoagulants: Secondary | ICD-10-CM | POA: Diagnosis not present

## 2023-05-31 DIAGNOSIS — E785 Hyperlipidemia, unspecified: Secondary | ICD-10-CM | POA: Diagnosis not present

## 2023-05-31 DIAGNOSIS — D631 Anemia in chronic kidney disease: Secondary | ICD-10-CM | POA: Diagnosis not present

## 2023-05-31 DIAGNOSIS — Z86711 Personal history of pulmonary embolism: Secondary | ICD-10-CM | POA: Diagnosis not present

## 2023-05-31 DIAGNOSIS — N281 Cyst of kidney, acquired: Secondary | ICD-10-CM | POA: Diagnosis not present

## 2023-05-31 DIAGNOSIS — K4020 Bilateral inguinal hernia, without obstruction or gangrene, not specified as recurrent: Secondary | ICD-10-CM | POA: Diagnosis not present

## 2023-05-31 DIAGNOSIS — Z87891 Personal history of nicotine dependence: Secondary | ICD-10-CM | POA: Diagnosis not present

## 2023-05-31 DIAGNOSIS — E1122 Type 2 diabetes mellitus with diabetic chronic kidney disease: Secondary | ICD-10-CM | POA: Diagnosis not present

## 2023-05-31 DIAGNOSIS — I10 Essential (primary) hypertension: Secondary | ICD-10-CM

## 2023-05-31 DIAGNOSIS — Z79899 Other long term (current) drug therapy: Secondary | ICD-10-CM | POA: Diagnosis not present

## 2023-05-31 DIAGNOSIS — N189 Chronic kidney disease, unspecified: Secondary | ICD-10-CM | POA: Diagnosis not present

## 2023-05-31 DIAGNOSIS — K317 Polyp of stomach and duodenum: Secondary | ICD-10-CM | POA: Diagnosis not present

## 2023-05-31 DIAGNOSIS — I129 Hypertensive chronic kidney disease with stage 1 through stage 4 chronic kidney disease, or unspecified chronic kidney disease: Secondary | ICD-10-CM | POA: Diagnosis not present

## 2023-05-31 DIAGNOSIS — I700 Atherosclerosis of aorta: Secondary | ICD-10-CM | POA: Diagnosis not present

## 2023-05-31 LAB — RENAL FUNCTION PANEL
Albumin: 3.7 g/dL (ref 3.5–5.0)
Anion gap: 11 (ref 5–15)
BUN: 35 mg/dL — ABNORMAL HIGH (ref 8–23)
CO2: 26 mmol/L (ref 22–32)
Calcium: 8.5 mg/dL — ABNORMAL LOW (ref 8.9–10.3)
Chloride: 100 mmol/L (ref 98–111)
Creatinine, Ser: 2.99 mg/dL — ABNORMAL HIGH (ref 0.61–1.24)
GFR, Estimated: 21 mL/min — ABNORMAL LOW (ref >60)
Glucose, Bld: 161 mg/dL — ABNORMAL HIGH (ref 70–99)
Phosphorus: 3.5 mg/dL (ref 2.5–4.6)
Potassium: 3.4 mmol/L — ABNORMAL LOW (ref 3.5–5.1)
Sodium: 137 mmol/L (ref 135–145)

## 2023-05-31 LAB — PROTEIN / CREATININE RATIO, URINE
Creatinine, Urine: 161 mg/dL
Protein Creatinine Ratio: 0.62 mg/mg{Cre} — ABNORMAL HIGH (ref 0.00–0.15)
Total Protein, Urine: 100 mg/dL

## 2023-06-05 ENCOUNTER — Telehealth: Payer: Self-pay | Admitting: Adult Health

## 2023-06-05 ENCOUNTER — Encounter: Payer: Self-pay | Admitting: Hematology

## 2023-06-05 NOTE — Progress Notes (Signed)
Entered in error

## 2023-06-06 NOTE — Telephone Encounter (Signed)
See result note.  

## 2023-06-07 DIAGNOSIS — N184 Chronic kidney disease, stage 4 (severe): Secondary | ICD-10-CM | POA: Diagnosis not present

## 2023-06-07 DIAGNOSIS — D696 Thrombocytopenia, unspecified: Secondary | ICD-10-CM | POA: Diagnosis not present

## 2023-06-07 DIAGNOSIS — E876 Hypokalemia: Secondary | ICD-10-CM | POA: Diagnosis not present

## 2023-06-07 DIAGNOSIS — I129 Hypertensive chronic kidney disease with stage 1 through stage 4 chronic kidney disease, or unspecified chronic kidney disease: Secondary | ICD-10-CM | POA: Diagnosis not present

## 2023-06-08 ENCOUNTER — Telehealth: Payer: Self-pay | Admitting: Adult Health

## 2023-06-08 NOTE — Telephone Encounter (Signed)
Spoke with patient regarding results and recommendations. Patient voiced understanding. No further questions or concerns.

## 2023-06-08 NOTE — Telephone Encounter (Signed)
Noted. Nothing further needed. 

## 2023-06-13 ENCOUNTER — Inpatient Hospital Stay: Payer: Medicare HMO

## 2023-06-14 ENCOUNTER — Inpatient Hospital Stay: Payer: Medicare HMO | Attending: Hematology

## 2023-06-14 ENCOUNTER — Inpatient Hospital Stay: Payer: Medicare HMO

## 2023-06-14 VITALS — BP 143/83 | HR 72 | Temp 98.1°F | Resp 18

## 2023-06-14 DIAGNOSIS — D509 Iron deficiency anemia, unspecified: Secondary | ICD-10-CM | POA: Diagnosis not present

## 2023-06-14 DIAGNOSIS — N184 Chronic kidney disease, stage 4 (severe): Secondary | ICD-10-CM | POA: Diagnosis not present

## 2023-06-14 DIAGNOSIS — D631 Anemia in chronic kidney disease: Secondary | ICD-10-CM | POA: Insufficient documentation

## 2023-06-14 DIAGNOSIS — D649 Anemia, unspecified: Secondary | ICD-10-CM

## 2023-06-14 LAB — CBC WITH DIFFERENTIAL/PLATELET
Abs Immature Granulocytes: 0 10*3/uL (ref 0.00–0.07)
Basophils Absolute: 0 10*3/uL (ref 0.0–0.1)
Basophils Relative: 1 %
Eosinophils Absolute: 0.1 10*3/uL (ref 0.0–0.5)
Eosinophils Relative: 2 %
HCT: 30.6 % — ABNORMAL LOW (ref 39.0–52.0)
Hemoglobin: 9.9 g/dL — ABNORMAL LOW (ref 13.0–17.0)
Immature Granulocytes: 0 %
Lymphocytes Relative: 36 %
Lymphs Abs: 1.5 10*3/uL (ref 0.7–4.0)
MCH: 28.1 pg (ref 26.0–34.0)
MCHC: 32.4 g/dL (ref 30.0–36.0)
MCV: 86.9 fL (ref 80.0–100.0)
Monocytes Absolute: 0.5 10*3/uL (ref 0.1–1.0)
Monocytes Relative: 11 %
Neutro Abs: 2.1 10*3/uL (ref 1.7–7.7)
Neutrophils Relative %: 50 %
Platelets: 157 10*3/uL (ref 150–400)
RBC: 3.52 MIL/uL — ABNORMAL LOW (ref 4.22–5.81)
RDW: 14.4 % (ref 11.5–15.5)
WBC: 4.2 10*3/uL (ref 4.0–10.5)
nRBC: 0 % (ref 0.0–0.2)

## 2023-06-14 MED ORDER — EPOETIN ALFA 10000 UNIT/ML IJ SOLN
6000.0000 [IU] | Freq: Once | INTRAMUSCULAR | Status: AC
  Administered 2023-06-14: 6000 [IU] via SUBCUTANEOUS
  Filled 2023-06-14: qty 1

## 2023-06-14 NOTE — Progress Notes (Signed)
Patient tolerated injection with no complaints voiced.  Site clean and dry with no bruising or swelling noted at site.  See MAR for details.  Band aid applied.  Patient stable during and after injection.  Vss with discharge and left in satisfactory condition with no s/s of distress noted.  

## 2023-06-14 NOTE — Patient Instructions (Signed)

## 2023-06-19 DIAGNOSIS — H01004 Unspecified blepharitis left upper eyelid: Secondary | ICD-10-CM | POA: Diagnosis not present

## 2023-06-19 DIAGNOSIS — H02834 Dermatochalasis of left upper eyelid: Secondary | ICD-10-CM | POA: Diagnosis not present

## 2023-06-19 DIAGNOSIS — Z961 Presence of intraocular lens: Secondary | ICD-10-CM | POA: Diagnosis not present

## 2023-06-19 DIAGNOSIS — H01002 Unspecified blepharitis right lower eyelid: Secondary | ICD-10-CM | POA: Diagnosis not present

## 2023-06-19 DIAGNOSIS — H35371 Puckering of macula, right eye: Secondary | ICD-10-CM | POA: Diagnosis not present

## 2023-06-19 DIAGNOSIS — H01005 Unspecified blepharitis left lower eyelid: Secondary | ICD-10-CM | POA: Diagnosis not present

## 2023-06-19 DIAGNOSIS — H02831 Dermatochalasis of right upper eyelid: Secondary | ICD-10-CM | POA: Diagnosis not present

## 2023-06-19 DIAGNOSIS — H353131 Nonexudative age-related macular degeneration, bilateral, early dry stage: Secondary | ICD-10-CM | POA: Diagnosis not present

## 2023-06-19 DIAGNOSIS — H11153 Pinguecula, bilateral: Secondary | ICD-10-CM | POA: Diagnosis not present

## 2023-06-19 DIAGNOSIS — E119 Type 2 diabetes mellitus without complications: Secondary | ICD-10-CM | POA: Diagnosis not present

## 2023-06-19 DIAGNOSIS — H01001 Unspecified blepharitis right upper eyelid: Secondary | ICD-10-CM | POA: Diagnosis not present

## 2023-06-19 DIAGNOSIS — H18413 Arcus senilis, bilateral: Secondary | ICD-10-CM | POA: Diagnosis not present

## 2023-06-27 ENCOUNTER — Inpatient Hospital Stay: Payer: Medicare HMO

## 2023-06-28 ENCOUNTER — Inpatient Hospital Stay: Payer: Medicare HMO

## 2023-06-28 VITALS — BP 159/89 | HR 68 | Temp 97.7°F | Resp 18

## 2023-06-28 DIAGNOSIS — N184 Chronic kidney disease, stage 4 (severe): Secondary | ICD-10-CM | POA: Diagnosis not present

## 2023-06-28 DIAGNOSIS — D509 Iron deficiency anemia, unspecified: Secondary | ICD-10-CM

## 2023-06-28 DIAGNOSIS — D649 Anemia, unspecified: Secondary | ICD-10-CM

## 2023-06-28 DIAGNOSIS — D631 Anemia in chronic kidney disease: Secondary | ICD-10-CM | POA: Diagnosis not present

## 2023-06-28 LAB — CBC WITH DIFFERENTIAL/PLATELET
Abs Immature Granulocytes: 0.01 10*3/uL (ref 0.00–0.07)
Basophils Absolute: 0 10*3/uL (ref 0.0–0.1)
Basophils Relative: 0 %
Eosinophils Absolute: 0.2 10*3/uL (ref 0.0–0.5)
Eosinophils Relative: 4 %
HCT: 29 % — ABNORMAL LOW (ref 39.0–52.0)
Hemoglobin: 9.5 g/dL — ABNORMAL LOW (ref 13.0–17.0)
Immature Granulocytes: 0 %
Lymphocytes Relative: 28 %
Lymphs Abs: 1.4 10*3/uL (ref 0.7–4.0)
MCH: 28.4 pg (ref 26.0–34.0)
MCHC: 32.8 g/dL (ref 30.0–36.0)
MCV: 86.8 fL (ref 80.0–100.0)
Monocytes Absolute: 0.4 10*3/uL (ref 0.1–1.0)
Monocytes Relative: 8 %
Neutro Abs: 2.9 10*3/uL (ref 1.7–7.7)
Neutrophils Relative %: 60 %
Platelets: 155 10*3/uL (ref 150–400)
RBC: 3.34 MIL/uL — ABNORMAL LOW (ref 4.22–5.81)
RDW: 14.1 % (ref 11.5–15.5)
WBC: 4.9 10*3/uL (ref 4.0–10.5)
nRBC: 0 % (ref 0.0–0.2)

## 2023-06-28 MED ORDER — EPOETIN ALFA 10000 UNIT/ML IJ SOLN: 6000.0000 [IU] | Freq: Once | INTRAMUSCULAR | Status: DC

## 2023-06-28 MED ORDER — EPOETIN ALFA 3000 UNIT/ML IJ SOLN
6000.0000 [IU] | Freq: Once | INTRAMUSCULAR | Status: AC
  Administered 2023-06-28: 6000 [IU] via SUBCUTANEOUS
  Filled 2023-06-28: qty 2

## 2023-06-28 NOTE — Progress Notes (Signed)
Patient tolerated injection with no complaints voiced.  Site clean and dry with no bruising or swelling noted at site.  See MAR for details.  Band aid applied.  Patient stable during and after injection.  Vss with discharge and left in satisfactory condition with no s/s of distress noted.  

## 2023-06-28 NOTE — Patient Instructions (Signed)

## 2023-07-01 DIAGNOSIS — J449 Chronic obstructive pulmonary disease, unspecified: Secondary | ICD-10-CM | POA: Diagnosis not present

## 2023-07-10 ENCOUNTER — Other Ambulatory Visit: Payer: Self-pay

## 2023-07-10 DIAGNOSIS — D509 Iron deficiency anemia, unspecified: Secondary | ICD-10-CM

## 2023-07-10 DIAGNOSIS — R911 Solitary pulmonary nodule: Secondary | ICD-10-CM

## 2023-07-10 NOTE — Progress Notes (Unsigned)
[facility] [address], [place] [address]   CLINIC:  Medical Oncology/Hematology  PCP:  Lovey Newcomer, PA [address] 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 Andrew Adams 77 y.o. male returns for routine follow-up of normocytic anemia.  He was last seen by NP Durenda Hurt on 05/16/2023.  At today's visit, he reports feeling fair.  No recent hospitalizations, surgeries, or changes in baseline health status. He is tolerating Epogen well without any major side effects.  Blood pressure has been ranging from SBP 130-170 at home, which is more or less his baseline over the past several years per his report.  BP is managed by his PCP and he is compliant with medications.  No symptoms concerning for DVT or PE.  He  denies any bleeding episodes.  No fatigue, pica, headaches, chest pain, lightheadedness, or syncope.  He has mild baseline dyspnea on exertion.     He has 75% energy and 100% appetite. He endorses that he is maintaining a stable weight.  ASSESSMENT & PLAN:  1.  Normocytic anemia - Patient seen at the request of Dr. Wolfgang Phoenix for anemia in the setting of CKD stage IV - Combination anemia from CKD stage IV and functional iron deficiency. - On iron supplement daily since 2021.  No prior history of blood transfusion. - Colonoscopy (08/28/2020): Nonbleeding internal hemorrhoids, diverticulosis. - EGD (02/07/2022): Multiple gastric polyps. -Electrophoresis did not reveal an M spike.  Kappa and lambda free light chains elevated without elevated kappa lambda light chain ratio (consistent with CKD stage IV).  Immunofixation was unremarkable.  UPEP was negative. - Previous B12 and folate levels within the normal range. - Retacrit initiated in February 2024.  Currently receiving Epogen 6000  units every 2 weeks. - Most recent IV iron with Venofer in April 2024 - Denies any bleeding per rectum or melena. - Labs today (07/11/2023): Hgb 10.2/MCV 85.9.  Ferritin 165, iron saturation 24%. - PLAN: Continue Epogen 6000 units every 2 weeks.   - No indication for IV iron at this time - RTC 3 months  2.  Social/family history: - Lives with wife at home.  Independent of ADLs and IADLs.  Has been on O2 via nasal cannula since December 2023.  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  Quit smoking 25 years ago. - No family history of severe anemia.  Sister has cancer but type unknown to the patient.  PLAN SUMMARY: >> CBC + Epogen every 2 weeks >> Labs (CBC/D, ferritin, iron/TIBC, BMP) + injection + OFFICE visit in 3 months     REVIEW OF SYSTEMS:   Review of Systems  Constitutional:  Positive for fatigue. Negative for appetite change, chills, diaphoresis, fever and unexpected weight change.  HENT:   Negative for lump/mass and nosebleeds.   Eyes:  Negative for eye problems.  Respiratory:  Positive for shortness of breath. Negative for cough and hemoptysis.   Cardiovascular:  Negative for chest pain, leg swelling and palpitations.  Gastrointestinal:  Negative for abdominal pain, blood in stool, constipation, diarrhea, nausea and vomiting.  Genitourinary:  Negative for hematuria.   Skin: Negative.   Neurological:  Negative for dizziness, headaches and light-headedness.  Hematological:  Does not bruise/bleed easily.     PHYSICAL EXAM:  ECOG PERFORMANCE STATUS: 1 -  Symptomatic but completely ambulatory  Vitals:   07/11/23 0848 07/11/23 0853  BP: (!) 175/97 (!) 152/91  Pulse: 65   Resp: 16   Temp: 98.1 F (36.7 C)   SpO2: 99%    Filed Weights   07/11/23 0848  Weight: 214 lb 15.2 oz (97.5 kg)   Physical Exam Constitutional:      Appearance: Normal appearance. He is obese.  Cardiovascular:     Heart sounds: Normal heart sounds.  Pulmonary:     Breath sounds:  Normal breath sounds.  Neurological:     General: No focal deficit present.     Mental Status: Mental status is at baseline.  Psychiatric:        Behavior: Behavior normal. Behavior is cooperative.     PAST MEDICAL/SURGICAL HISTORY:  Past Medical History:  Diagnosis Date   Anemia    Borderline diabetic    Chronic kidney disease    Diabetes mellitus without complication (HCC)    Duodenal adenoma    2021   GERD (gastroesophageal reflux disease)    Gout    Gouty arthritis    H/O small bowel obstruction    History of back surgery    Hx of adenomatous colonic polyps    2021   Hyperlipidemia    Hypertension    PE (pulmonary embolism)    Sleep apnea    Past Surgical History:  Procedure Laterality Date   ABDOMINAL SURGERY  2103   Smal bowel obstruction    ABDOMINAL SURGERY  1981   Perforated large intestine   back     Lower back   BIOPSY  08/28/2020   Procedure: BIOPSY;  Surgeon: Dolores Frame, MD;  [facility];  Service: Gastroenterology;;   BIOPSY  02/07/2022   Procedure: BIOPSY;  Surgeon: Lemar Lofty., MD;  Location: [facility];  Service: Gastroenterology;;   CATARACT EXTRACTION W/PHACO Right 01/06/2020   Procedure: CATARACT EXTRACTION PHACO AND INTRAOCULAR LENS PLACEMENT (IOC) (CDE: 6.48);  Surgeon: Fabio Pierce, MD;  Location: [facility];  Service: Ophthalmology;  Laterality: Right;   CATARACT EXTRACTION W/PHACO Left 01/20/2020   Procedure: CATARACT EXTRACTION PHACO AND INTRAOCULAR LENS PLACEMENT (IOC);  Surgeon: Fabio Pierce, MD;  Location: [facility];  Service: Ophthalmology;  Laterality: Left;  CDE: 5.86   COLONOSCOPY WITH PROPOFOL N/A 08/28/2020   Procedure: COLONOSCOPY WITH PROPOFOL;  Surgeon: Dolores Frame, MD;  [facility];  Service: Gastroenterology;  Laterality: N/A;  230   ENDOSCOPIC MUCOSAL RESECTION N/A 02/07/2022   Procedure: ENDOSCOPIC MUCOSAL RESECTION;  Surgeon: Meridee Score Netty Starring., MD;  Location: [facility];  Service: Gastroenterology;  Laterality: N/A;   ESOPHAGOGASTRODUODENOSCOPY N/A 02/07/2022   Procedure: ESOPHAGOGASTRODUODENOSCOPY (EGD);  Surgeon: Lemar Lofty., MD;  Location: [facility];  Service: Gastroenterology;  Laterality: N/A;   ESOPHAGOGASTRODUODENOSCOPY (EGD) WITH PROPOFOL N/A 08/28/2020   Procedure: ESOPHAGOGASTRODUODENOSCOPY (EGD) WITH PROPOFOL;  Surgeon: Dolores Frame, MD;  [facility];  Service: Gastroenterology;  Laterality: N/A;   ESOPHAGOGASTRODUODENOSCOPY (EGD) WITH PROPOFOL N/A 12/14/2020   Procedure: ESOPHAGOGASTRODUODENOSCOPY (EGD) WITH PROPOFOL;  Surgeon: Meridee Score Netty Starring., MD;  [facility]: [facility];  Service: Gastroenterology;  Laterality: N/A;   GIVENS CAPSULE STUDY N/A 09/10/2020   Procedure: GIVENS CAPSULE STUDY;  Surgeon: Dolores Frame, MD;  [facility];  Service: Gastroenterology;  Laterality: N/A;  730   HEMOSTASIS CLIP PLACEMENT  12/14/2020   Procedure: HEMOSTASIS CLIP PLACEMENT;  Surgeon: Meridee Score Netty Starring., MD;  Location: [facility];  Service: Gastroenterology;;  HEMOSTASIS CLIP PLACEMENT  02/07/2022   Procedure: HEMOSTASIS CLIP PLACEMENT;  Surgeon: Lemar Lofty., MD;  Location: [facility];  Service: Gastroenterology;;   HEMOSTASIS CONTROL  12/14/2020   Procedure: HEMOSTASIS CONTROL;  Surgeon: Lemar Lofty., MD;  Location: [facility];  Service: Gastroenterology;;   HERNIA REPAIR     incisional   HOT HEMOSTASIS N/A 02/07/2022   Procedure: HOT HEMOSTASIS (ARGON PLASMA COAGULATION/BICAP);  Surgeon: Lemar Lofty., MD;  Location: [facility];  Service: Gastroenterology;  Laterality: N/A;   POLYPECTOMY  08/28/2020   Procedure: POLYPECTOMY;  Surgeon: Dolores Frame, MD;  [facility];  Service: Gastroenterology;;   POLYPECTOMY  12/14/2020   Procedure: POLYPECTOMY;  Surgeon: Lemar Lofty., MD;  Location: [facility];   Service: Gastroenterology;;   Susa Day  12/14/2020   Procedure: Susa Day;  Surgeon: Mansouraty, Netty Starring., MD;  Location: [facility];  Service: Gastroenterology;;   Brooke Dare INJECTION  02/07/2022   Procedure: SUBMUCOSAL LIFTING INJECTION;  Surgeon: Lemar Lofty., MD;  Location: [facility];  Service: Gastroenterology;;   SUBMUCOSAL TATTOO INJECTION  12/14/2020   Procedure: SUBMUCOSAL TATTOO INJECTION;  Surgeon: Lemar Lofty., MD;  Location: [facility];  Service: Gastroenterology;;    SOCIAL HISTORY:  Social History   Socioeconomic History   Marital status: Married    Spouse name: Not on file   Number of children: 1   Years of education: Not on file   Highest education level: Not on file  Occupational History   Occupation: Finisher  Tobacco Use   Smoking status: Former    Current packs/day: 0.00    Average packs/day: 1 pack/day for 38.0 years (38.0 ttl pk-yrs)    Types: Cigarettes    Start date: 02/06/1957    Quit date: 02/07/1995    Years since quitting: 28.4   Smokeless tobacco: Never  Vaping Use   Vaping status: Never Used  Substance and Sexual Activity   Alcohol use: Yes    Alcohol/week: 2.0 standard drinks of alcohol    Types: 2 Shots of liquor per week    Comment: occasional    Drug use: Yes    Types: Marijuana   Sexual activity: Yes  Other Topics Concern   Not on file  Social History Narrative   Lives with wife.     Social Determinants of Health   Financial Resource Strain: Not on file  Food Insecurity: No Food Insecurity (01/31/2023)   Hunger Vital Sign    Worried About Running Out of Food in the Last Year: Never true    Ran Out of Food in the Last Year: Never true  Transportation Needs: No Transportation Needs (01/31/2023)   PRAPARE - Administrator, Civil Service (Medical): No    Lack of Transportation (Non-Medical): No  Physical Activity: Not on file  Stress: Not on file  Social Connections: Not on file   Intimate Partner Violence: Not At Risk (01/31/2023)   Humiliation, Afraid, Rape, and Kick questionnaire    Fear of Current or Ex-Partner: No    Emotionally Abused: No    Physically Abused: No    Sexually Abused: No    FAMILY HISTORY:  Family History  Problem Relation Age of Onset   Hypertension Sister    Diabetes Sister    Diabetes Sister    Hypertension Mother    Diabetes Mother    Hypertension Father    Ulcers Father    Colon cancer Neg Hx    Esophageal cancer Neg  Hx    Stomach cancer Neg Hx    Inflammatory bowel disease Neg Hx    Liver disease Neg Hx    Pancreatic cancer Neg Hx    Rectal cancer Neg Hx     CURRENT MEDICATIONS:  Outpatient Encounter Medications as of 07/11/2023  Medication Sig Note   albuterol (VENTOLIN HFA) 108 (90 Base) MCG/ACT inhaler Inhale 2 puffs into the lungs every 4 (four) hours as needed.    allopurinol (ZYLOPRIM) 100 MG tablet Take 1 tablet (100 mg total) by mouth daily.    amLODipine (NORVASC) 5 MG tablet Take 5 mg by mouth daily.    apixaban (ELIQUIS) 5 MG TABS tablet Take 2 tablets (10 mg total) by mouth 2 (two) times daily. On 08/27/22, start 5 mg  (1 tab) two times daily    atorvastatin (LIPITOR) 40 MG tablet Take 1 tablet (40 mg total) by mouth daily.    bisoprolol (ZEBETA) 5 MG tablet Take 1 tablet (5 mg total) by mouth daily.    epoetin alfa (EPOGEN) 3000 UNIT/ML injection Inject into the skin.    epoetin alfa-epbx (RETACRIT) 3000 UNIT/ML injection Inject 3,000 Units into the vein every 14 (fourteen) days.    FEROSUL 325 (65 Fe) MG tablet TAKE ONE TABLET BY MOUTH EVERY MORNING WITH BREAKFAST    furosemide (LASIX) 40 MG tablet Take 1 tablet (40 mg total) by mouth daily. (Patient taking differently: Take 2 tabs (80mg ) by mouth every morning & 1 tab (40mg ) every evening)    hydrALAZINE (APRESOLINE) 25 MG tablet Take 1 tablet (25 mg total) by mouth every 6 (six) hours. 01/23/2023: His after visit summary from Dr. Wolfgang Phoenix says 2 tabs TID    ipratropium-albuterol (DUONEB) 0.5-2.5 (3) MG/3ML SOLN Take 3 mLs by nebulization every 6 (six) hours as needed.    Multiple Vitamin (MULTIVITAMIN WITH MINERALS) TABS tablet Take 1 tablet by mouth daily.    pantoprazole (PROTONIX) 40 MG tablet Take 1 tablet (40 mg total) by mouth 2 (two) times daily before a meal. (Patient taking differently: Take 40 mg by mouth daily.)    potassium chloride (KLOR-CON) 10 MEQ tablet Take 10 mEq by mouth daily.    TRULICITY 0.75 MG/0.5ML SOPN Inject 0.75 mg into the skin every Friday.    No facility-administered encounter medications on file as of 07/11/2023.    ALLERGIES:  No Known Allergies  LABORATORY DATA:  I have reviewed the labs as listed.  CBC    Component Value Date/Time   WBC 4.8 07/11/2023 0737   RBC 3.68 (L) 07/11/2023 0737   HGB 10.2 (L) 07/11/2023 0737   HCT 31.6 (L) 07/11/2023 0737   PLT 150 07/11/2023 0737   MCV 85.9 07/11/2023 0737   MCV 84.2 04/12/2013 1501   MCH 27.7 07/11/2023 0737   MCHC 32.3 07/11/2023 0737   RDW 13.7 07/11/2023 0737   LYMPHSABS 1.7 07/11/2023 0737   MONOABS 0.5 07/11/2023 0737   EOSABS 0.2 07/11/2023 0737   BASOSABS 0.0 07/11/2023 0737      Latest Ref Rng & Units 05/31/2023    9:33 AM 04/20/2023    9:18 AM 04/19/2023    4:00 AM  CMP  Glucose 70 - 99 mg/dL 161  096  83   BUN 8 - 23 mg/dL 35  37  44   Creatinine 0.61 - 1.24 mg/dL 0.45  4.09  8.11   Sodium 135 - 145 mmol/L 137  142  139   Potassium 3.5 - 5.1 mmol/L 3.4  3.3  3.6   Chloride 98 - 111 mmol/L 100  103  103   CO2 22 - 32 mmol/L 26  27  28    Calcium 8.6 - 10.2 mg/dL 8.9 - 91.4 mg/dL 8.8    8.5  8.5  7.9     DIAGNOSTIC IMAGING:  I have independently reviewed the relevant imaging and discussed with the patient.   WRAP UP:  All questions were answered. The patient knows to call the clinic with any problems, questions or concerns.  Medical decision making: Low  Time spent on visit: I spent 15 minutes counseling the patient face to face.  The total time spent in the appointment was 22 minutes and more than 50% was on counseling.  Carnella Guadalajara, PA-C  07/11/23 9:32 AM

## 2023-07-11 ENCOUNTER — Inpatient Hospital Stay: Payer: Medicare HMO | Attending: Hematology | Admitting: Physician Assistant

## 2023-07-11 ENCOUNTER — Inpatient Hospital Stay: Payer: Medicare HMO

## 2023-07-11 VITALS — BP 152/91 | HR 65 | Temp 98.1°F | Resp 16 | Wt 214.9 lb

## 2023-07-11 DIAGNOSIS — Z7901 Long term (current) use of anticoagulants: Secondary | ICD-10-CM | POA: Diagnosis not present

## 2023-07-11 DIAGNOSIS — Z87891 Personal history of nicotine dependence: Secondary | ICD-10-CM | POA: Diagnosis not present

## 2023-07-11 DIAGNOSIS — G4730 Sleep apnea, unspecified: Secondary | ICD-10-CM | POA: Diagnosis not present

## 2023-07-11 DIAGNOSIS — N184 Chronic kidney disease, stage 4 (severe): Secondary | ICD-10-CM | POA: Diagnosis not present

## 2023-07-11 DIAGNOSIS — Z79899 Other long term (current) drug therapy: Secondary | ICD-10-CM | POA: Insufficient documentation

## 2023-07-11 DIAGNOSIS — Z86711 Personal history of pulmonary embolism: Secondary | ICD-10-CM | POA: Insufficient documentation

## 2023-07-11 DIAGNOSIS — Z7985 Long-term (current) use of injectable non-insulin antidiabetic drugs: Secondary | ICD-10-CM | POA: Insufficient documentation

## 2023-07-11 DIAGNOSIS — M109 Gout, unspecified: Secondary | ICD-10-CM | POA: Diagnosis not present

## 2023-07-11 DIAGNOSIS — D509 Iron deficiency anemia, unspecified: Secondary | ICD-10-CM

## 2023-07-11 DIAGNOSIS — I129 Hypertensive chronic kidney disease with stage 1 through stage 4 chronic kidney disease, or unspecified chronic kidney disease: Secondary | ICD-10-CM | POA: Insufficient documentation

## 2023-07-11 DIAGNOSIS — E119 Type 2 diabetes mellitus without complications: Secondary | ICD-10-CM | POA: Insufficient documentation

## 2023-07-11 DIAGNOSIS — D649 Anemia, unspecified: Secondary | ICD-10-CM

## 2023-07-11 DIAGNOSIS — D631 Anemia in chronic kidney disease: Secondary | ICD-10-CM | POA: Diagnosis not present

## 2023-07-11 DIAGNOSIS — Z86010 Personal history of colonic polyps: Secondary | ICD-10-CM | POA: Diagnosis not present

## 2023-07-11 DIAGNOSIS — E785 Hyperlipidemia, unspecified: Secondary | ICD-10-CM | POA: Insufficient documentation

## 2023-07-11 DIAGNOSIS — K317 Polyp of stomach and duodenum: Secondary | ICD-10-CM | POA: Diagnosis not present

## 2023-07-11 DIAGNOSIS — K219 Gastro-esophageal reflux disease without esophagitis: Secondary | ICD-10-CM | POA: Insufficient documentation

## 2023-07-11 LAB — CBC WITH DIFFERENTIAL/PLATELET
Abs Immature Granulocytes: 0.01 10*3/uL (ref 0.00–0.07)
Basophils Absolute: 0 10*3/uL (ref 0.0–0.1)
Basophils Relative: 1 %
Eosinophils Absolute: 0.2 10*3/uL (ref 0.0–0.5)
Eosinophils Relative: 4 %
HCT: 31.6 % — ABNORMAL LOW (ref 39.0–52.0)
Hemoglobin: 10.2 g/dL — ABNORMAL LOW (ref 13.0–17.0)
Immature Granulocytes: 0 %
Lymphocytes Relative: 35 %
Lymphs Abs: 1.7 10*3/uL (ref 0.7–4.0)
MCH: 27.7 pg (ref 26.0–34.0)
MCHC: 32.3 g/dL (ref 30.0–36.0)
MCV: 85.9 fL (ref 80.0–100.0)
Monocytes Absolute: 0.5 10*3/uL (ref 0.1–1.0)
Monocytes Relative: 10 %
Neutro Abs: 2.4 10*3/uL (ref 1.7–7.7)
Neutrophils Relative %: 50 %
Platelets: 150 10*3/uL (ref 150–400)
RBC: 3.68 MIL/uL — ABNORMAL LOW (ref 4.22–5.81)
RDW: 13.7 % (ref 11.5–15.5)
WBC: 4.8 10*3/uL (ref 4.0–10.5)
nRBC: 0 % (ref 0.0–0.2)

## 2023-07-11 LAB — IRON AND TIBC
Iron: 64 ug/dL (ref 45–182)
Saturation Ratios: 24 % (ref 17.9–39.5)
TIBC: 269 ug/dL (ref 250–450)
UIBC: 205 ug/dL

## 2023-07-11 LAB — FERRITIN: Ferritin: 165 ng/mL (ref 24–336)

## 2023-07-11 MED ORDER — EPOETIN ALFA 10000 UNIT/ML IJ SOLN: 6000.0000 [IU] | Freq: Once | INTRAMUSCULAR | Status: DC

## 2023-07-11 MED ORDER — EPOETIN ALFA 3000 UNIT/ML IJ SOLN
6000.0000 [IU] | Freq: Once | INTRAMUSCULAR | Status: AC
  Administered 2023-07-11: 6000 [IU] via SUBCUTANEOUS
  Filled 2023-07-11: qty 2

## 2023-07-11 NOTE — Progress Notes (Signed)
Repeat BP 152/91 and ok to proceed with injection verbal order Rojelio Brenner, PA.  Pharmacy made aware.   Patient presents today for Retacrit injection. Hemoglobin reviewed prior to administration. VSS. Injection tolerated without incident or complaint. See MAR for details. Patient stable during and after injection.  Patient discharged in satisfactory condition with no s/s of distress noted.

## 2023-07-11 NOTE — Patient Instructions (Signed)
[facility] **VISIT SUMMARY & IMPORTANT INSTRUCTIONS **   You were seen today by Rojelio Brenner PA-C for your anemia.   Your anemia is related to your chronic kidney disease. Your blood levels are currently improving. We will continue giving you Epogen injections every 2 weeks.  FOLLOW-UP APPOINTMENT: Labs and office visit in 3 months  ** Thank you for trusting me with your healthcare!  I strive to provide all of my patients with quality care at each visit.  If you receive a survey for this visit, I would be so grateful to you for taking the time to provide feedback.  Thank you in advance!  ~ Kristopher Attwood                   Dr. Doreatha Massed   &   Rojelio Brenner, PA-C   - - - - - - - - - - - - - - - - - -    Thank you for choosing [facility] at [facility] to provide your oncology and hematology care.  To afford each patient quality time with our provider, please arrive at least 15 minutes before your scheduled appointment time.   If you have a lab appointment with the Cancer Center please come in thru the Main Entrance and check in at the main information desk.  You need to re-schedule your appointment should you arrive 10 or more minutes late.  We strive to give you quality time with our providers, and arriving late affects you and other patients whose appointments are after yours.  Also, if you no show three or more times for appointments you may be dismissed from the clinic at the providers discretion.     Again, thank you for choosing [facility].  Our hope is that these requests will decrease the amount of time that you wait before being seen by our physicians.       _____________________________________________________________  Should you have questions after your visit to [facility], please contact our office at [phone] and follow the prompts.  Our office hours are 8:00 a.m. and 4:30  p.m. Monday - Friday.  Please note that voicemails left after 4:00 p.m. may not be returned until the following business day.  We are closed weekends and major holidays.  You do have access to a nurse 24-7, just call the main number to the clinic [phone] and do not press any options, hold on the line and a nurse will answer the phone.    For prescription refill requests, have your pharmacy contact our office and allow 72 hours.

## 2023-07-11 NOTE — Patient Instructions (Signed)

## 2023-07-17 DIAGNOSIS — E1122 Type 2 diabetes mellitus with diabetic chronic kidney disease: Secondary | ICD-10-CM | POA: Diagnosis not present

## 2023-07-25 ENCOUNTER — Inpatient Hospital Stay: Payer: Medicare HMO

## 2023-07-25 VITALS — BP 180/98 | HR 76 | Temp 98.6°F

## 2023-07-25 DIAGNOSIS — I129 Hypertensive chronic kidney disease with stage 1 through stage 4 chronic kidney disease, or unspecified chronic kidney disease: Secondary | ICD-10-CM | POA: Diagnosis not present

## 2023-07-25 DIAGNOSIS — G4730 Sleep apnea, unspecified: Secondary | ICD-10-CM | POA: Diagnosis not present

## 2023-07-25 DIAGNOSIS — Z7985 Long-term (current) use of injectable non-insulin antidiabetic drugs: Secondary | ICD-10-CM | POA: Diagnosis not present

## 2023-07-25 DIAGNOSIS — Z79899 Other long term (current) drug therapy: Secondary | ICD-10-CM | POA: Diagnosis not present

## 2023-07-25 DIAGNOSIS — D631 Anemia in chronic kidney disease: Secondary | ICD-10-CM | POA: Diagnosis not present

## 2023-07-25 DIAGNOSIS — N184 Chronic kidney disease, stage 4 (severe): Secondary | ICD-10-CM

## 2023-07-25 DIAGNOSIS — Z86010 Personal history of colonic polyps: Secondary | ICD-10-CM | POA: Diagnosis not present

## 2023-07-25 DIAGNOSIS — M109 Gout, unspecified: Secondary | ICD-10-CM | POA: Diagnosis not present

## 2023-07-25 DIAGNOSIS — Z86711 Personal history of pulmonary embolism: Secondary | ICD-10-CM | POA: Diagnosis not present

## 2023-07-25 DIAGNOSIS — D509 Iron deficiency anemia, unspecified: Secondary | ICD-10-CM

## 2023-07-25 DIAGNOSIS — K317 Polyp of stomach and duodenum: Secondary | ICD-10-CM | POA: Diagnosis not present

## 2023-07-25 DIAGNOSIS — K219 Gastro-esophageal reflux disease without esophagitis: Secondary | ICD-10-CM | POA: Diagnosis not present

## 2023-07-25 DIAGNOSIS — E785 Hyperlipidemia, unspecified: Secondary | ICD-10-CM | POA: Diagnosis not present

## 2023-07-25 DIAGNOSIS — Z87891 Personal history of nicotine dependence: Secondary | ICD-10-CM | POA: Diagnosis not present

## 2023-07-25 DIAGNOSIS — Z7901 Long term (current) use of anticoagulants: Secondary | ICD-10-CM | POA: Diagnosis not present

## 2023-07-25 DIAGNOSIS — E119 Type 2 diabetes mellitus without complications: Secondary | ICD-10-CM | POA: Diagnosis not present

## 2023-07-25 LAB — CBC
HCT: 31.4 % — ABNORMAL LOW (ref 39.0–52.0)
Hemoglobin: 10.3 g/dL — ABNORMAL LOW (ref 13.0–17.0)
MCH: 27.5 pg (ref 26.0–34.0)
MCHC: 32.8 g/dL (ref 30.0–36.0)
MCV: 84 fL (ref 80.0–100.0)
Platelets: 156 10*3/uL (ref 150–400)
RBC: 3.74 MIL/uL — ABNORMAL LOW (ref 4.22–5.81)
RDW: 13.6 % (ref 11.5–15.5)
WBC: 5.5 10*3/uL (ref 4.0–10.5)
nRBC: 0 % (ref 0.0–0.2)

## 2023-07-25 MED ORDER — EPOETIN ALFA 10000 UNIT/ML IJ SOLN: 6000.0000 [IU] | Freq: Once | INTRAMUSCULAR | Status: DC

## 2023-07-25 MED ORDER — EPOETIN ALFA 3000 UNIT/ML IJ SOLN
6000.0000 [IU] | Freq: Once | INTRAMUSCULAR | Status: DC
  Filled 2023-07-25: qty 2

## 2023-07-25 NOTE — Progress Notes (Signed)
Patients BP was 180/98. Let PA know and she said to check again in 15 mins. BP was checked in 15 minutes and was 182/100. PA notified and said for patient to be discharged home and to hold treatment for 2 weeks.

## 2023-07-31 DIAGNOSIS — J449 Chronic obstructive pulmonary disease, unspecified: Secondary | ICD-10-CM | POA: Diagnosis not present

## 2023-08-08 ENCOUNTER — Inpatient Hospital Stay: Payer: Medicare HMO | Attending: Hematology

## 2023-08-08 ENCOUNTER — Other Ambulatory Visit: Payer: Self-pay

## 2023-08-08 ENCOUNTER — Inpatient Hospital Stay: Payer: Medicare HMO

## 2023-08-08 VITALS — BP 169/88 | HR 65 | Temp 97.8°F | Resp 20

## 2023-08-08 DIAGNOSIS — N184 Chronic kidney disease, stage 4 (severe): Secondary | ICD-10-CM | POA: Insufficient documentation

## 2023-08-08 DIAGNOSIS — D509 Iron deficiency anemia, unspecified: Secondary | ICD-10-CM

## 2023-08-08 DIAGNOSIS — D631 Anemia in chronic kidney disease: Secondary | ICD-10-CM | POA: Diagnosis not present

## 2023-08-08 LAB — PROTEIN, URINE, RANDOM: Total Protein, Urine: 36 mg/dL

## 2023-08-08 LAB — CBC
HCT: 32.3 % — ABNORMAL LOW (ref 39.0–52.0)
Hemoglobin: 10.7 g/dL — ABNORMAL LOW (ref 13.0–17.0)
MCH: 28.3 pg (ref 26.0–34.0)
MCHC: 33.1 g/dL (ref 30.0–36.0)
MCV: 85.4 fL (ref 80.0–100.0)
Platelets: 148 10*3/uL — ABNORMAL LOW (ref 150–400)
RBC: 3.78 MIL/uL — ABNORMAL LOW (ref 4.22–5.81)
RDW: 13.8 % (ref 11.5–15.5)
WBC: 4.1 10*3/uL (ref 4.0–10.5)
nRBC: 0 % (ref 0.0–0.2)

## 2023-08-08 LAB — RENAL FUNCTION PANEL
Albumin: 3.9 g/dL (ref 3.5–5.0)
Anion gap: 11 (ref 5–15)
BUN: 34 mg/dL — ABNORMAL HIGH (ref 8–23)
CO2: 27 mmol/L (ref 22–32)
Calcium: 8.6 mg/dL — ABNORMAL LOW (ref 8.9–10.3)
Chloride: 98 mmol/L (ref 98–111)
Creatinine, Ser: 2.53 mg/dL — ABNORMAL HIGH (ref 0.61–1.24)
GFR, Estimated: 25 mL/min — ABNORMAL LOW (ref >60)
Glucose, Bld: 129 mg/dL — ABNORMAL HIGH (ref 70–99)
Phosphorus: 3.6 mg/dL (ref 2.5–4.6)
Potassium: 3.3 mmol/L — ABNORMAL LOW (ref 3.5–5.1)
Sodium: 136 mmol/L (ref 135–145)

## 2023-08-08 LAB — CREATININE, URINE, RANDOM: Creatinine, Urine: 34 mg/dL

## 2023-08-08 MED ORDER — EPOETIN ALFA 10000 UNIT/ML IJ SOLN: 6000.0000 [IU] | Freq: Once | INTRAMUSCULAR | Status: DC

## 2023-08-08 MED ORDER — EPOETIN ALFA 3000 UNIT/ML IJ SOLN
6000.0000 [IU] | Freq: Once | INTRAMUSCULAR | Status: AC
  Administered 2023-08-08: 6000 [IU] via INTRAVENOUS
  Filled 2023-08-08: qty 2

## 2023-08-08 NOTE — Patient Instructions (Signed)
[facility]  Discharge Instructions: Thank you for choosing [facility] to provide your oncology and hematology care.  If you have a lab appointment with the Cancer Center - please note that after April 8th, 2024, all labs will be drawn in the cancer center.  You do not have to check in or register with the main entrance as you have in the past but will complete your check-in in the cancer center.  Wear comfortable clothing and clothing appropriate for easy access to any Portacath or PICC line.   We strive to give you quality time with your provider. You may need to reschedule your appointment if you arrive late (15 or more minutes).  Arriving late affects you and other patients whose appointments are after yours.  Also, if you miss three or more appointments without notifying the office, you may be dismissed from the clinic at the provider's discretion.      For prescription refill requests, have your pharmacy contact our office and allow 72 hours for refills to be completed.    Today you received Epogen 6,000U injection     BELOW ARE SYMPTOMS THAT SHOULD BE REPORTED IMMEDIATELY: *FEVER GREATER THAN 100.4 F (38 C) OR HIGHER *CHILLS OR SWEATING *NAUSEA AND VOMITING THAT IS NOT CONTROLLED WITH YOUR NAUSEA MEDICATION *UNUSUAL SHORTNESS OF BREATH *UNUSUAL BRUISING OR BLEEDING *URINARY PROBLEMS (pain or burning when urinating, or frequent urination) *BOWEL PROBLEMS (unusual diarrhea, constipation, pain near the anus) TENDERNESS IN MOUTH AND THROAT WITH OR WITHOUT PRESENCE OF ULCERS (sore throat, sores in mouth, or a toothache) UNUSUAL RASH, SWELLING OR PAIN  UNUSUAL VAGINAL DISCHARGE OR ITCHING   Items with * indicate a potential emergency and should be followed up as soon as possible or go to the Emergency Department if any problems should occur.  Please show the CHEMOTHERAPY ALERT CARD or IMMUNOTHERAPY ALERT CARD at check-in to the Emergency Department  and triage nurse.  Should you have questions after your visit or need to cancel or reschedule your appointment, please contact [facility] AT [facility] [phone]  and follow the prompts.  Office hours are 8:00 a.m. to 4:30 p.m. Monday - Friday. Please note that voicemails left after 4:00 p.m. may not be returned until the following business day.  We are closed weekends and major holidays. You have access to a nurse at all times for urgent questions. Please call the main number to the clinic [phone] and follow the prompts.  For any non-urgent questions, you may also contact your provider using MyChart. We now offer e-Visits for anyone 40 and older to request care online for non-urgent symptoms. For details visit [email or web address].   Also download the MyChart app! Go to the app store, search "MyChart", open the app, select , and log in with your MyChart username and password.

## 2023-08-08 NOTE — Progress Notes (Signed)
Lab orders entered

## 2023-08-08 NOTE — Addendum Note (Signed)
Addended by: Neita Goodnight on: 08/08/2023 03:58 PM   Modules accepted: Orders

## 2023-08-08 NOTE — Progress Notes (Signed)
Clint Guy presents today for injection per the provider's orders.  Epogen 6,000U administration without incident; injection site WNL; see MAR for injection details.  Patient tolerated procedure well and without incident.  No questions or complaints noted at this time. Pt's hemoglobin noted to be 10.7 today.  Discharged from clinic ambulatory in stable condition. Alert and oriented x 3. F/U with [facility] as scheduled.

## 2023-08-14 DIAGNOSIS — I5032 Chronic diastolic (congestive) heart failure: Secondary | ICD-10-CM | POA: Diagnosis not present

## 2023-08-14 DIAGNOSIS — I129 Hypertensive chronic kidney disease with stage 1 through stage 4 chronic kidney disease, or unspecified chronic kidney disease: Secondary | ICD-10-CM | POA: Diagnosis not present

## 2023-08-14 DIAGNOSIS — D631 Anemia in chronic kidney disease: Secondary | ICD-10-CM | POA: Diagnosis not present

## 2023-08-14 DIAGNOSIS — E876 Hypokalemia: Secondary | ICD-10-CM | POA: Diagnosis not present

## 2023-08-22 ENCOUNTER — Other Ambulatory Visit: Payer: Self-pay | Admitting: *Deleted

## 2023-08-22 ENCOUNTER — Inpatient Hospital Stay: Payer: Medicare HMO

## 2023-08-22 VITALS — BP 146/92 | HR 74 | Temp 97.7°F | Resp 19

## 2023-08-22 DIAGNOSIS — N184 Chronic kidney disease, stage 4 (severe): Secondary | ICD-10-CM | POA: Diagnosis not present

## 2023-08-22 DIAGNOSIS — D631 Anemia in chronic kidney disease: Secondary | ICD-10-CM

## 2023-08-22 DIAGNOSIS — D509 Iron deficiency anemia, unspecified: Secondary | ICD-10-CM

## 2023-08-22 DIAGNOSIS — N183 Chronic kidney disease, stage 3 unspecified: Secondary | ICD-10-CM

## 2023-08-22 LAB — RENAL FUNCTION PANEL
Albumin: 3.8 g/dL (ref 3.5–5.0)
Anion gap: 7 (ref 5–15)
BUN: 36 mg/dL — ABNORMAL HIGH (ref 8–23)
CO2: 29 mmol/L (ref 22–32)
Calcium: 8.7 mg/dL — ABNORMAL LOW (ref 8.9–10.3)
Chloride: 100 mmol/L (ref 98–111)
Creatinine, Ser: 2.77 mg/dL — ABNORMAL HIGH (ref 0.61–1.24)
GFR, Estimated: 23 mL/min — ABNORMAL LOW (ref >60)
Glucose, Bld: 126 mg/dL — ABNORMAL HIGH (ref 70–99)
Phosphorus: 4.1 mg/dL (ref 2.5–4.6)
Potassium: 3.5 mmol/L (ref 3.5–5.1)
Sodium: 136 mmol/L (ref 135–145)

## 2023-08-22 LAB — CBC
HCT: 33.6 % — ABNORMAL LOW (ref 39.0–52.0)
Hemoglobin: 10.6 g/dL — ABNORMAL LOW (ref 13.0–17.0)
MCH: 27.5 pg (ref 26.0–34.0)
MCHC: 31.5 g/dL (ref 30.0–36.0)
MCV: 87.3 fL (ref 80.0–100.0)
Platelets: 151 10*3/uL (ref 150–400)
RBC: 3.85 MIL/uL — ABNORMAL LOW (ref 4.22–5.81)
RDW: 13.5 % (ref 11.5–15.5)
WBC: 4.9 10*3/uL (ref 4.0–10.5)
nRBC: 0 % (ref 0.0–0.2)

## 2023-08-22 MED ORDER — EPOETIN ALFA 3000 UNIT/ML IJ SOLN
6000.0000 [IU] | Freq: Once | INTRAMUSCULAR | Status: AC
  Administered 2023-08-22: 6000 [IU] via SUBCUTANEOUS
  Filled 2023-08-22: qty 2

## 2023-08-22 NOTE — Patient Instructions (Signed)

## 2023-08-22 NOTE — Progress Notes (Signed)
Received amd placed order for Renal Function panel from Dr. Lucio Edward office.

## 2023-08-22 NOTE — Progress Notes (Signed)
Patient tolerated Epogen 6000 Units injection with no complaints voiced. Hgb 10.6.  Site clean and dry with no bruising or swelling noted at site.  See MAR for details.  Band aid applied.  Patient stable during and after injection.  Vss with discharge and left in satisfactory condition with no s/s of distress noted. All follow ups as scheduled.  [facility] [employer]

## 2023-08-29 DIAGNOSIS — N1832 Chronic kidney disease, stage 3b: Secondary | ICD-10-CM | POA: Diagnosis not present

## 2023-08-29 DIAGNOSIS — N189 Chronic kidney disease, unspecified: Secondary | ICD-10-CM | POA: Diagnosis not present

## 2023-08-29 DIAGNOSIS — I5032 Chronic diastolic (congestive) heart failure: Secondary | ICD-10-CM | POA: Diagnosis not present

## 2023-08-29 DIAGNOSIS — I2699 Other pulmonary embolism without acute cor pulmonale: Secondary | ICD-10-CM | POA: Diagnosis not present

## 2023-08-29 DIAGNOSIS — Z6832 Body mass index (BMI) 32.0-32.9, adult: Secondary | ICD-10-CM | POA: Diagnosis not present

## 2023-08-29 DIAGNOSIS — R030 Elevated blood-pressure reading, without diagnosis of hypertension: Secondary | ICD-10-CM | POA: Diagnosis not present

## 2023-08-29 DIAGNOSIS — E1122 Type 2 diabetes mellitus with diabetic chronic kidney disease: Secondary | ICD-10-CM | POA: Diagnosis not present

## 2023-08-31 DIAGNOSIS — J449 Chronic obstructive pulmonary disease, unspecified: Secondary | ICD-10-CM | POA: Diagnosis not present

## 2023-09-05 ENCOUNTER — Inpatient Hospital Stay: Payer: Medicare HMO

## 2023-09-05 ENCOUNTER — Inpatient Hospital Stay: Payer: Medicare HMO | Attending: Hematology

## 2023-09-05 DIAGNOSIS — N184 Chronic kidney disease, stage 4 (severe): Secondary | ICD-10-CM | POA: Diagnosis present

## 2023-09-05 DIAGNOSIS — D631 Anemia in chronic kidney disease: Secondary | ICD-10-CM | POA: Diagnosis present

## 2023-09-05 LAB — CBC
HCT: 34 % — ABNORMAL LOW (ref 39.0–52.0)
Hemoglobin: 11.2 g/dL — ABNORMAL LOW (ref 13.0–17.0)
MCH: 27.9 pg (ref 26.0–34.0)
MCHC: 32.9 g/dL (ref 30.0–36.0)
MCV: 84.8 fL (ref 80.0–100.0)
Platelets: 153 10*3/uL (ref 150–400)
RBC: 4.01 MIL/uL — ABNORMAL LOW (ref 4.22–5.81)
RDW: 13.5 % (ref 11.5–15.5)
WBC: 5.2 10*3/uL (ref 4.0–10.5)
nRBC: 0 % (ref 0.0–0.2)

## 2023-09-05 NOTE — Progress Notes (Signed)
HGB 11.2 today. NO Epogen given. Patient given copy of labs. Discharged from clinic ambulatory in stable condition. Alert and oriented x 3. F/U with [facility] as scheduled.

## 2023-09-19 ENCOUNTER — Inpatient Hospital Stay: Payer: Medicare HMO

## 2023-09-19 VITALS — BP 166/88 | HR 80 | Temp 99.0°F | Resp 17

## 2023-09-19 DIAGNOSIS — D631 Anemia in chronic kidney disease: Secondary | ICD-10-CM

## 2023-09-19 DIAGNOSIS — D509 Iron deficiency anemia, unspecified: Secondary | ICD-10-CM

## 2023-09-19 DIAGNOSIS — N184 Chronic kidney disease, stage 4 (severe): Secondary | ICD-10-CM | POA: Diagnosis not present

## 2023-09-19 LAB — CBC
HCT: 33 % — ABNORMAL LOW (ref 39.0–52.0)
Hemoglobin: 10.9 g/dL — ABNORMAL LOW (ref 13.0–17.0)
MCH: 27.7 pg (ref 26.0–34.0)
MCHC: 33 g/dL (ref 30.0–36.0)
MCV: 83.8 fL (ref 80.0–100.0)
Platelets: 175 10*3/uL (ref 150–400)
RBC: 3.94 MIL/uL — ABNORMAL LOW (ref 4.22–5.81)
RDW: 13.3 % (ref 11.5–15.5)
WBC: 4.7 10*3/uL (ref 4.0–10.5)
nRBC: 0 % (ref 0.0–0.2)

## 2023-09-19 MED ORDER — EPOETIN ALFA 3000 UNIT/ML IJ SOLN
6000.0000 [IU] | Freq: Once | INTRAMUSCULAR | Status: AC
  Administered 2023-09-19: 6000 [IU] via SUBCUTANEOUS
  Filled 2023-09-19: qty 2

## 2023-09-19 NOTE — Progress Notes (Signed)
Hemoglobin today is 10.9.  We will proceed with Procrit injection per treatment parameters.  Patient tolerated injection with no complaints voiced.  Site clean and dry with no bruising or swelling noted.  No complaints of pain.  Discharged with vital signs stable and no signs or symptoms of distress noted.

## 2023-09-19 NOTE — Patient Instructions (Signed)
[facility]  Discharge Instructions: Thank you for choosing [facility] to provide your oncology and hematology care.  If you have a lab appointment with the Cancer Center - please note that after April 8th, 2024, all labs will be drawn in the cancer center.  You do not have to check in or register with the main entrance as you have in the past but will complete your check-in in the cancer center.  Wear comfortable clothing and clothing appropriate for easy access to any Portacath or PICC line.   We strive to give you quality time with your provider. You may need to reschedule your appointment if you arrive late (15 or more minutes).  Arriving late affects you and other patients whose appointments are after yours.  Also, if you miss three or more appointments without notifying the office, you may be dismissed from the clinic at the provider's discretion.      For prescription refill requests, have your pharmacy contact our office and allow 72 hours for refills to be completed.    Today you received the following: Procrit.  Epoetin Alfa Injection What is this medication? EPOETIN ALFA (e POE e tin AL fa) treats low levels of red blood cells (anemia) caused by kidney disease, chemotherapy, or HIV medications. It can also be used in people who are at risk for blood loss during surgery. It works by Systems analyst make more red blood cells, which reduces the need for blood transfusions. This medicine may be used for other purposes; ask your health care provider or pharmacist if you have questions. COMMON BRAND NAME(S): Epogen, Procrit, Retacrit What should I tell my care team before I take this medication? They need to know if you have any of these conditions: Blood clots Cancer Heart disease High blood pressure On dialysis Seizures Stroke An unusual or allergic reaction to epoetin alfa, albumin, benzyl alcohol, other  medications, foods, dyes, or preservatives Pregnant or trying to get pregnant Breast-feeding How should I use this medication? This medication is injected into a vein or under the skin. It is usually given by your care team in a hospital or clinic setting. It may also be given at home. If you get this medication at home, you will be taught how to prepare and give it. Use exactly as directed. Take it as directed on the prescription label at the same time every day. Keep taking it unless your care team tells you to stop. It is important that you put your used needles and syringes in a special sharps container. Do not put them in a trash can. If you do not have a sharps container, call your pharmacist or care team to get one. A special MedGuide will be given to you by the pharmacist with each prescription and refill. Be sure to read this information carefully each time. Talk to your care team about the use of this medication in children. While this medication may be used in children as young as 1 month of age for selected conditions, precautions do apply. Overdosage: If you think you have taken too much of this medicine contact a poison control center or emergency room at once. NOTE: This medicine is only for you. Do not share this medicine with others. What if I miss a dose? If you miss a dose, take it as soon as you can. If it is almost time for your next dose, take  only that dose. Do not take double or extra doses. What may interact with this medication? Darbepoetin alfa Methoxy polyethylene glycol-epoetin beta This list may not describe all possible interactions. Give your health care provider a list of all the medicines, herbs, non-prescription drugs, or dietary supplements you use. Also tell them if you smoke, drink alcohol, or use illegal drugs. Some items may interact with your medicine. What should I watch for while using this medication? Visit your care team for regular checks on your  progress. Check your blood pressure as directed. Know what your blood pressure should be and when to contact your care team. Your condition will be monitored carefully while you are receiving this medication. You may need blood work while taking this medication. What side effects may I notice from receiving this medication? Side effects that you should report to your care team as soon as possible: Allergic reactions--skin rash, itching, hives, swelling of the face, lips, tongue, or throat Blood clot--pain, swelling, or warmth in the leg, shortness of breath, chest pain Heart attack--pain or tightness in the chest, shoulders, arms, or jaw, nausea, shortness of breath, cold or clammy skin, feeling faint or lightheaded Increase in blood pressure Rash, fever, and swollen lymph nodes Redness, blistering, peeling, or loosening of the skin, including inside the mouth Seizures Stroke--sudden numbness or weakness of the face, arm, or leg, trouble speaking, confusion, trouble walking, loss of balance or coordination, dizziness, severe headache, change in vision Side effects that usually do not require medical attention (report to your care team if they continue or are bothersome): Bone, joint, or muscle pain Cough Headache Nausea Pain, redness, or irritation at injection site This list may not describe all possible side effects. Call your doctor for medical advice about side effects. You may report side effects to FDA at 1-800-FDA-1088. Where should I keep my medication? Keep out of the reach of children and pets. Store in a refrigerator. Do not freeze. Do not shake. Protect from light. Keep this medication in the original container until you are ready to take it. See product for storage information. Get rid of any unused medication after the expiration date. To get rid of medications that are no longer needed or have expired: Take the medication to a medication take-back program. Check with your  pharmacy or law enforcement to find a location. If you cannot return the medication, ask your pharmacist or care team how to get rid of the medication safely. NOTE: This sheet is a summary. It may not cover all possible information. If you have questions about this medicine, talk to your doctor, pharmacist, or health care provider.  2024 Elsevier/Gold Standard (2022-02-18 00:00:00)     To help prevent nausea and vomiting after your treatment, we encourage you to take your nausea medication as directed.  BELOW ARE SYMPTOMS THAT SHOULD BE REPORTED IMMEDIATELY: *FEVER GREATER THAN 100.4 F (38 C) OR HIGHER *CHILLS OR SWEATING *NAUSEA AND VOMITING THAT IS NOT CONTROLLED WITH YOUR NAUSEA MEDICATION *UNUSUAL SHORTNESS OF BREATH *UNUSUAL BRUISING OR BLEEDING *URINARY PROBLEMS (pain or burning when urinating, or frequent urination) *BOWEL PROBLEMS (unusual diarrhea, constipation, pain near the anus) TENDERNESS IN MOUTH AND THROAT WITH OR WITHOUT PRESENCE OF ULCERS (sore throat, sores in mouth, or a toothache) UNUSUAL RASH, SWELLING OR PAIN  UNUSUAL VAGINAL DISCHARGE OR ITCHING   Items with * indicate a potential emergency and should be followed up as soon as possible or go to the Emergency Department if any problems should occur.  Please show the CHEMOTHERAPY ALERT CARD or IMMUNOTHERAPY ALERT CARD at check-in to the Emergency Department and triage nurse.  Should you have questions after your visit or need to cancel or reschedule your appointment, please contact [facility] - A DEPT OF Eligha Bridegroom [facility] [phone]  and follow the prompts.  Office hours are 8:00 a.m. to 4:30 p.m. Monday - Friday. Please note that voicemails left after 4:00 p.m. may not be returned until the following business day.  We are closed weekends and major holidays. You have access to a nurse at all times for urgent questions. Please call the main number to the clinic [phone] and follow the  prompts.  For any non-urgent questions, you may also contact your provider using MyChart. We now offer e-Visits for anyone 6 and older to request care online for non-urgent symptoms. For details visit [email or web address].   Also download the MyChart app! Go to the app store, search "MyChart", open the app, select , and log in with your MyChart username and password.

## 2023-09-30 DIAGNOSIS — J449 Chronic obstructive pulmonary disease, unspecified: Secondary | ICD-10-CM | POA: Diagnosis not present

## 2023-10-03 ENCOUNTER — Inpatient Hospital Stay: Payer: Medicare HMO | Attending: Hematology

## 2023-10-03 ENCOUNTER — Inpatient Hospital Stay: Payer: Medicare HMO

## 2023-10-03 VITALS — BP 168/87 | HR 74 | Temp 98.1°F | Resp 18

## 2023-10-03 DIAGNOSIS — D509 Iron deficiency anemia, unspecified: Secondary | ICD-10-CM

## 2023-10-03 DIAGNOSIS — N184 Chronic kidney disease, stage 4 (severe): Secondary | ICD-10-CM | POA: Diagnosis present

## 2023-10-03 DIAGNOSIS — D631 Anemia in chronic kidney disease: Secondary | ICD-10-CM | POA: Diagnosis present

## 2023-10-03 LAB — CBC
HCT: 30.6 % — ABNORMAL LOW (ref 39.0–52.0)
Hemoglobin: 10 g/dL — ABNORMAL LOW (ref 13.0–17.0)
MCH: 28.1 pg (ref 26.0–34.0)
MCHC: 32.7 g/dL (ref 30.0–36.0)
MCV: 86 fL (ref 80.0–100.0)
Platelets: 142 10*3/uL — ABNORMAL LOW (ref 150–400)
RBC: 3.56 MIL/uL — ABNORMAL LOW (ref 4.22–5.81)
RDW: 14.2 % (ref 11.5–15.5)
WBC: 4.5 10*3/uL (ref 4.0–10.5)
nRBC: 0 % (ref 0.0–0.2)

## 2023-10-03 MED ORDER — EPOETIN ALFA 3000 UNIT/ML IJ SOLN
6000.0000 [IU] | Freq: Once | INTRAMUSCULAR | Status: AC
  Administered 2023-10-03: 6000 [IU] via SUBCUTANEOUS
  Filled 2023-10-03: qty 2

## 2023-10-03 NOTE — Patient Instructions (Signed)
[facility] [place] [facility]  Discharge Instructions: Thank you for choosing [facility] to provide your oncology and hematology care.  If you have a lab appointment with the Cancer Center - please note that after April 8th, 2024, all labs will be drawn in the cancer center.  You do not have to check in or register with the main entrance as you have in the past but will complete your check-in in the cancer center.  Wear comfortable clothing and clothing appropriate for easy access to any Portacath or PICC line.   We strive to give you quality time with your provider. You may need to reschedule your appointment if you arrive late (15 or more minutes).  Arriving late affects you and other patients whose appointments are after yours.  Also, if you miss three or more appointments without notifying the office, you may be dismissed from the clinic at the provider's discretion.      For prescription refill requests, have your pharmacy contact our office and allow 72 hours for refills to be completed.    Today you received the following: Procrit.  Epoetin Alfa Injection What is this medication? EPOETIN ALFA (e POE e tin AL fa) treats low levels of red blood cells (anemia) caused by kidney disease, chemotherapy, or HIV medications. It can also be used in people who are at risk for blood loss during surgery. It works by Systems analyst make more red blood cells, which reduces the need for blood transfusions. This medicine may be used for other purposes; ask your health care provider or pharmacist if you have questions. COMMON BRAND NAME(S): Epogen, Procrit, Retacrit What should I tell my care team before I take this medication? They need to know if you have any of these conditions: Blood clots Cancer Heart disease High blood pressure On dialysis Seizures Stroke An unusual or allergic reaction to epoetin alfa, albumin, benzyl alcohol, other  medications, foods, dyes, or preservatives Pregnant or trying to get pregnant Breast-feeding How should I use this medication? This medication is injected into a vein or under the skin. It is usually given by your care team in a hospital or clinic setting. It may also be given at home. If you get this medication at home, you will be taught how to prepare and give it. Use exactly as directed. Take it as directed on the prescription label at the same time every day. Keep taking it unless your care team tells you to stop. It is important that you put your used needles and syringes in a special sharps container. Do not put them in a trash can. If you do not have a sharps container, call your pharmacist or care team to get one. A special MedGuide will be given to you by the pharmacist with each prescription and refill. Be sure to read this information carefully each time. Talk to your care team about the use of this medication in children. While this medication may be used in children as young as 1 month of age for selected conditions, precautions do apply. Overdosage: If you think you have taken too much of this medicine contact a poison control center or emergency room at once. NOTE: This medicine is only for you. Do not share this medicine with others. What if I miss a dose? If you miss a dose, take it as soon as you can. If it is almost time for your next dose,  take only that dose. Do not take double or extra doses. What may interact with this medication? Darbepoetin alfa Methoxy polyethylene glycol-epoetin beta This list may not describe all possible interactions. Give your health care provider a list of all the medicines, herbs, non-prescription drugs, or dietary supplements you use. Also tell them if you smoke, drink alcohol, or use illegal drugs. Some items may interact with your medicine. What should I watch for while using this medication? Visit your care team for regular checks on your  progress. Check your blood pressure as directed. Know what your blood pressure should be and when to contact your care team. Your condition will be monitored carefully while you are receiving this medication. You may need blood work while taking this medication. What side effects may I notice from receiving this medication? Side effects that you should report to your care team as soon as possible: Allergic reactions--skin rash, itching, hives, swelling of the face, lips, tongue, or throat Blood clot--pain, swelling, or warmth in the leg, shortness of breath, chest pain Heart attack--pain or tightness in the chest, shoulders, arms, or jaw, nausea, shortness of breath, cold or clammy skin, feeling faint or lightheaded Increase in blood pressure Rash, fever, and swollen lymph nodes Redness, blistering, peeling, or loosening of the skin, including inside the mouth Seizures Stroke--sudden numbness or weakness of the face, arm, or leg, trouble speaking, confusion, trouble walking, loss of balance or coordination, dizziness, severe headache, change in vision Side effects that usually do not require medical attention (report to your care team if they continue or are bothersome): Bone, joint, or muscle pain Cough Headache Nausea Pain, redness, or irritation at injection site This list may not describe all possible side effects. Call your doctor for medical advice about side effects. You may report side effects to FDA at 1-800-FDA-1088. Where should I keep my medication? Keep out of the reach of children and pets. Store in a refrigerator. Do not freeze. Do not shake. Protect from light. Keep this medication in the original container until you are ready to take it. See product for storage information. Get rid of any unused medication after the expiration date. To get rid of medications that are no longer needed or have expired: Take the medication to a medication take-back program. Check with your  pharmacy or law enforcement to find a location. If you cannot return the medication, ask your pharmacist or care team how to get rid of the medication safely. NOTE: This sheet is a summary. It may not cover all possible information. If you have questions about this medicine, talk to your doctor, pharmacist, or health care provider.  2024 Elsevier/Gold Standard (2022-02-18 00:00:00)     To help prevent nausea and vomiting after your treatment, we encourage you to take your nausea medication as directed.  BELOW ARE SYMPTOMS THAT SHOULD BE REPORTED IMMEDIATELY: *FEVER GREATER THAN 100.4 F (38 C) OR HIGHER *CHILLS OR SWEATING *NAUSEA AND VOMITING THAT IS NOT CONTROLLED WITH YOUR NAUSEA MEDICATION *UNUSUAL SHORTNESS OF BREATH *UNUSUAL BRUISING OR BLEEDING *URINARY PROBLEMS (pain or burning when urinating, or frequent urination) *BOWEL PROBLEMS (unusual diarrhea, constipation, pain near the anus) TENDERNESS IN MOUTH AND THROAT WITH OR WITHOUT PRESENCE OF ULCERS (sore throat, sores in mouth, or a toothache) UNUSUAL RASH, SWELLING OR PAIN  UNUSUAL VAGINAL DISCHARGE OR ITCHING   Items with * indicate a potential emergency and should be followed up as soon as possible or go to the Emergency Department if any problems should occur.  Please show the CHEMOTHERAPY ALERT CARD or IMMUNOTHERAPY ALERT CARD at check-in to the Emergency Department and triage nurse.  Should you have questions after your visit or need to cancel or reschedule your appointment, please contact [facility] [place] - A DEPT OF Eligha Bridegroom [facility] [phone]  and follow the prompts.  Office hours are 8:00 a.m. to 4:30 p.m. Monday - Friday. Please note that voicemails left after 4:00 p.m. may not be returned until the following business day.  We are closed weekends and major holidays. You have access to a nurse at all times for urgent questions. Please call the main number to the clinic [phone] and follow the  prompts.  For any non-urgent questions, you may also contact your provider using MyChart. We now offer e-Visits for anyone 78 and older to request care online for non-urgent symptoms. For details visit [email or web address].   Also download the MyChart app! Go to the app store, search "MyChart", open the app, select [facility], and log in with your MyChart username and password.

## 2023-10-16 DIAGNOSIS — E1122 Type 2 diabetes mellitus with diabetic chronic kidney disease: Secondary | ICD-10-CM | POA: Diagnosis not present

## 2023-10-16 NOTE — Progress Notes (Signed)
[facility] 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 male returns for routine follow-up of normocytic anemia.  He was last seen by Rojelio Brenner PA-C on 07/11/2023.  At today's visit, he reports feeling well.  No recent hospitalizations, surgeries, or changes in baseline health status.  He does have some elevated blood pressure, with BP today 187/108 and home blood pressure with SBP 140-170.  BP is managed by his PCP and he is compliant with medications.  Otherwise, He is tolerating Epogen well without any major side effects.  No symptoms concerning for DVT or PE.  He reports scant rectal bleeding from hemorrhoids (with wiping), but denies any major bleeding episodes.  No fatigue, pica, headaches, chest pain, lightheadedness, or syncope.  He has mild baseline dyspnea on exertion.     He has 80% energy and 100% appetite. He endorses that he is maintaining a stable weight.  ASSESSMENT & PLAN:  1.  Normocytic anemia - Patient seen at the request of Dr. Wolfgang Phoenix for anemia in the setting of CKD stage IV - Combination anemia from CKD stage IV and functional iron deficiency. - On iron supplement daily since 2021.  No prior history of blood transfusion. - Colonoscopy (08/28/2020): Nonbleeding internal hemorrhoids, diverticulosis. - EGD (02/07/2022): Multiple gastric polyps. -Electrophoresis did not reveal an M spike.  Kappa and lambda free light chains elevated without elevated kappa lambda light chain ratio (consistent with CKD stage IV).  Immunofixation was unremarkable.  UPEP was negative. - Previous B12 and folate levels within the normal range. - Retacrit  initiated in February 2024.  Currently receiving Epogen 6000 units every 2 weeks. - Most recent IV iron with Venofer in April 2024 - Denies any major bleeding per rectum or melena. - Labs today (10/17/2023): Hgb 10.6/MCV 86.0, ferritin 244, iron saturation 32%.  Creatinine 2.64/GFR 24. - PLAN: Continue Epogen 6000 units every 2 weeks.   - No indication for IV iron at this time - RTC 3 months  2.  Elevated blood pressure - Initial blood pressure was 187/108 - Repeat blood pressure after office visit concluded was 177/94 - Patient reports that his BP was 161/71 this morning at home, and then he took his BP medication as prescribed.  He does report also taking breathing treatment prior to office visit. - Patient is asymptomatic; no headache, dizziness, blurry vision, strokelike symptoms, chest pain, or dyspnea  - Patient reports that home blood pressure is usually SBP 140-170 - PLAN: Patient instructed to repeat blood pressure at home after resting for half an hour, and to contact PCP if blood pressure is greater than 160/90.  Instructed on alarm symptoms that would prompt 911 call and immediate medical attention.  3.  Social/family history: - Lives with wife at home.  Independent of ADLs and IADLs.  Has been on O2 via nasal cannula since December 2023.  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  Quit smoking 25 years ago. - No family history of severe anemia.  Sister has cancer but type unknown to the patient.  PLAN SUMMARY: >> CBC + Epogen every 2 weeks >> Labs (CBC/D, ferritin, iron/TIBC, BMP) + injection +  OFFICE visit in 3 months     REVIEW OF SYSTEMS:   Review of Systems  Constitutional:  Negative for appetite change, chills, diaphoresis, fatigue, fever and unexpected weight change.  HENT:   Negative for lump/mass and nosebleeds.   Eyes:  Negative for eye problems.  Respiratory:  Positive for shortness of breath (at times). Negative for cough and hemoptysis.    Cardiovascular:  Negative for chest pain, leg swelling and palpitations.  Gastrointestinal:  Negative for abdominal pain, blood in stool, constipation, diarrhea, nausea and vomiting.  Genitourinary:  Negative for hematuria.   Skin: Negative.   Neurological:  Negative for dizziness, headaches and light-headedness.  Hematological:  Does not bruise/bleed easily.     PHYSICAL EXAM:  ECOG PERFORMANCE STATUS: 1 - Symptomatic but completely ambulatory  Vitals:   10/17/23 0914 10/17/23 0916  BP: (!) 187/108 (!) 177/94  Pulse: 75   Resp: 18   Temp: 99 F (37.2 C)   SpO2: 98%     Filed Weights   10/17/23 0914  Weight: 212 lb (96.2 kg)    Physical Exam Constitutional:      Appearance: Normal appearance. He is obese.  Cardiovascular:     Heart sounds: Normal heart sounds.  Pulmonary:     Breath sounds: Normal breath sounds.  Neurological:     General: No focal deficit present.     Mental Status: Mental status is at baseline.  Psychiatric:        Behavior: Behavior normal. Behavior is cooperative.     PAST MEDICAL/SURGICAL HISTORY:  Past Medical History:  Diagnosis Date   Anemia    Borderline diabetic    Chronic kidney disease    Diabetes mellitus without complication (HCC)    Duodenal adenoma    2021   GERD (gastroesophageal reflux disease)    Gout    Gouty arthritis    H/O small bowel obstruction    History of back surgery    Hx of adenomatous colonic polyps    2021   Hyperlipidemia    Hypertension    PE (pulmonary embolism)    Sleep apnea    Past Surgical History:  Procedure Laterality Date   ABDOMINAL SURGERY  2103   Smal bowel obstruction    ABDOMINAL SURGERY  1981   Perforated large intestine   back     Lower back   BIOPSY  08/28/2020   Procedure: BIOPSY;  Surgeon: Dolores Frame, MD;  [facility]: [facility];  Service: Gastroenterology;;   BIOPSY  02/07/2022   Procedure: BIOPSY;  Surgeon: Lemar Lofty., MD;  Location: [facility];  Service: Gastroenterology;;   CATARACT EXTRACTION W/PHACO Right 01/06/2020   Procedure: CATARACT EXTRACTION PHACO AND INTRAOCULAR LENS PLACEMENT (IOC) (CDE: 6.48);  Surgeon: Fabio Pierce, MD;  Location: [facility];  Service: Ophthalmology;  Laterality: Right;   CATARACT EXTRACTION W/PHACO Left 01/20/2020   Procedure: CATARACT EXTRACTION PHACO AND INTRAOCULAR LENS PLACEMENT (IOC);  Surgeon: Fabio Pierce, MD;  Location: [facility];  Service: Ophthalmology;  Laterality: Left;  CDE: 5.86   COLONOSCOPY WITH PROPOFOL N/A 08/28/2020   Procedure: COLONOSCOPY WITH PROPOFOL;  Surgeon: Dolores Frame, MD;  [facility];  Service: Gastroenterology;  Laterality: N/A;  230   ENDOSCOPIC MUCOSAL RESECTION N/A 02/07/2022   Procedure: ENDOSCOPIC MUCOSAL RESECTION;  Surgeon: Meridee Score Netty Starring., MD;  Location: [facility];  Service: Gastroenterology;  Laterality: N/A;   ESOPHAGOGASTRODUODENOSCOPY N/A 02/07/2022   Procedure: ESOPHAGOGASTRODUODENOSCOPY (EGD);  Surgeon: Lemar Lofty., MD;  Location: [facility];  Service: Gastroenterology;  Laterality: N/A;   ESOPHAGOGASTRODUODENOSCOPY (EGD) WITH PROPOFOL N/A 08/28/2020   Procedure: ESOPHAGOGASTRODUODENOSCOPY (EGD) WITH PROPOFOL;  Surgeon: Dolores Frame, MD;  [facility];  Service: Gastroenterology;  Laterality: N/A;   ESOPHAGOGASTRODUODENOSCOPY (EGD) WITH PROPOFOL N/A 12/14/2020   Procedure: ESOPHAGOGASTRODUODENOSCOPY (EGD) WITH PROPOFOL;  Surgeon: Meridee Score Netty Starring., MD;  Location: [facility];  Service: Gastroenterology;  Laterality: N/A;   GIVENS CAPSULE STUDY N/A 09/10/2020   Procedure: GIVENS CAPSULE STUDY;  Surgeon: Dolores Frame, MD;  [facility];  Service: Gastroenterology;  Laterality: N/A;  730   HEMOSTASIS CLIP PLACEMENT  12/14/2020   Procedure: HEMOSTASIS CLIP PLACEMENT;  Surgeon: Lemar Lofty., MD;  Location: [facility];  Service: Gastroenterology;;    HEMOSTASIS CLIP PLACEMENT  02/07/2022   Procedure: HEMOSTASIS CLIP PLACEMENT;  Surgeon: Lemar Lofty., MD;  Location: [facility];  Service: Gastroenterology;;   HEMOSTASIS CONTROL  12/14/2020   Procedure: HEMOSTASIS CONTROL;  Surgeon: Lemar Lofty., MD;  Location: [facility];  Service: Gastroenterology;;   HERNIA REPAIR     incisional   HOT HEMOSTASIS N/A 02/07/2022   Procedure: HOT HEMOSTASIS (ARGON PLASMA COAGULATION/BICAP);  Surgeon: Lemar Lofty., MD;  Location: [facility];  Service: Gastroenterology;  Laterality: N/A;   POLYPECTOMY  08/28/2020   Procedure: POLYPECTOMY;  Surgeon: Dolores Frame, MD;  [facility];  Service: Gastroenterology;;   POLYPECTOMY  12/14/2020   Procedure: POLYPECTOMY;  Surgeon: Lemar Lofty., MD;  Location: [facility];  Service: Gastroenterology;;   Susa Day  12/14/2020   Procedure: Susa Day;  Surgeon: Mansouraty, Netty Starring., MD;  Location: [facility];  Service: Gastroenterology;;   Brooke Dare INJECTION  02/07/2022   Procedure: SUBMUCOSAL LIFTING INJECTION;  Surgeon: Lemar Lofty., MD;  Location: [facility];  Service: Gastroenterology;;   SUBMUCOSAL TATTOO INJECTION  12/14/2020   Procedure: SUBMUCOSAL TATTOO INJECTION;  Surgeon: Lemar Lofty., MD;  Location: [facility];  Service: Gastroenterology;;    SOCIAL HISTORY:  Social History   Socioeconomic History   Marital status: Married    Spouse name: Not on file   Number of children: 1   Years of education: Not on file   Highest education level: Not on file  Occupational History   Occupation: Finisher  Tobacco Use   Smoking status: Former    Current packs/day: 0.00    Average packs/day: 1 pack/day for 38.0 years (38.0 ttl pk-yrs)    Types: Cigarettes    Start date: 02/06/1957    Quit date: 02/07/1995    Years since quitting: 28.7   Smokeless tobacco: Never  Vaping Use   Vaping status: Never Used   Substance and Sexual Activity   Alcohol use: Yes    Alcohol/week: 2.0 standard drinks of alcohol    Types: 2 Shots of liquor per week    Comment: occasional    Drug use: Yes    Types: Marijuana   Sexual activity: Yes  Other Topics Concern   Not on file  Social History Narrative   Lives with wife.     Social Drivers of Corporate investment banker Strain: Not on file  Food Insecurity: No Food Insecurity (01/31/2023)   Hunger Vital Sign    Worried About Running Out of Food in the Last Year: Never true    Ran Out of Food in the Last Year: Never true  Transportation Needs: No Transportation Needs (01/31/2023)   PRAPARE - Transportation    Lack of  Transportation (Medical): No    Lack of Transportation (Non-Medical): No  Physical Activity: Not on file  Stress: Not on file  Social Connections: Not on file  Intimate Partner Violence: Not At Risk (01/31/2023)   Humiliation, Afraid, Rape, and Kick questionnaire    Fear of Current or Ex-Partner: No    Emotionally Abused: No    Physically Abused: No    Sexually Abused: No    FAMILY HISTORY:  Family History  Problem Relation Age of Onset   Hypertension Sister    Diabetes Sister    Diabetes Sister    Hypertension Mother    Diabetes Mother    Hypertension Father    Ulcers Father    Colon cancer Neg Hx    Esophageal cancer Neg Hx    Stomach cancer Neg Hx    Inflammatory bowel disease Neg Hx    Liver disease Neg Hx    Pancreatic cancer Neg Hx    Rectal cancer Neg Hx     CURRENT MEDICATIONS:  Outpatient Encounter Medications as of 10/17/2023  Medication Sig Note   albuterol (VENTOLIN HFA) 108 (90 Base) MCG/ACT inhaler Inhale 2 puffs into the lungs every 4 (four) hours as needed.    allopurinol (ZYLOPRIM) 100 MG tablet Take 1 tablet (100 mg total) by mouth daily.    amLODipine (NORVASC) 5 MG tablet Take 5 mg by mouth daily.    apixaban (ELIQUIS) 5 MG TABS tablet Take 2 tablets (10 mg total) by mouth 2 (two) times daily. On  08/27/22, start 5 mg  (1 tab) two times daily    atorvastatin (LIPITOR) 40 MG tablet Take 1 tablet (40 mg total) by mouth daily.    bisoprolol (ZEBETA) 5 MG tablet Take 1 tablet (5 mg total) by mouth daily.    epoetin alfa (EPOGEN) 3000 UNIT/ML injection Inject into the skin.    epoetin alfa-epbx (RETACRIT) 3000 UNIT/ML injection Inject 3,000 Units into the vein every 14 (fourteen) days.    FEROSUL 325 (65 Fe) MG tablet TAKE ONE TABLET BY MOUTH EVERY MORNING WITH BREAKFAST    furosemide (LASIX) 40 MG tablet Take 1 tablet (40 mg total) by mouth daily. (Patient taking differently: Take 2 tabs (80mg ) by mouth every morning & 1 tab (40mg ) every evening)    hydrALAZINE (APRESOLINE) 25 MG tablet Take 1 tablet (25 mg total) by mouth every 6 (six) hours. 01/23/2023: His after visit summary from Dr. Wolfgang Phoenix says 2 tabs TID   ipratropium-albuterol (DUONEB) 0.5-2.5 (3) MG/3ML SOLN Take 3 mLs by nebulization every 6 (six) hours as needed.    Multiple Vitamin (MULTIVITAMIN WITH MINERALS) TABS tablet Take 1 tablet by mouth daily.    pantoprazole (PROTONIX) 40 MG tablet Take 1 tablet (40 mg total) by mouth 2 (two) times daily before a meal. (Patient taking differently: Take 40 mg by mouth daily.)    potassium chloride (KLOR-CON) 10 MEQ tablet Take 10 mEq by mouth daily.    TRULICITY 0.75 MG/0.5ML SOPN Inject 0.75 mg into the skin every Friday.    No facility-administered encounter medications on file as of 10/17/2023.    ALLERGIES:  No Known Allergies  LABORATORY DATA:  I have reviewed the labs as listed.  CBC    Component Value Date/Time   WBC 5.1 10/17/2023 0755   RBC 3.85 (L) 10/17/2023 0755   HGB 10.6 (L) 10/17/2023 0755   HCT 33.1 (L) 10/17/2023 0755   PLT 143 (L) 10/17/2023 0755   MCV 86.0 10/17/2023 0755  MCV 84.2 04/12/2013 1501   MCH 27.5 10/17/2023 0755   MCHC 32.0 10/17/2023 0755   RDW 14.2 10/17/2023 0755   LYMPHSABS 1.6 10/17/2023 0755   MONOABS 0.5 10/17/2023 0755   EOSABS 0.3  10/17/2023 0755   BASOSABS 0.0 10/17/2023 0755      Latest Ref Rng & Units 10/17/2023    7:55 AM 08/22/2023    8:45 AM 08/08/2023    9:00 AM  CMP  Glucose 70 - 99 mg/dL 161  096  045   BUN 8 - 23 mg/dL 34  36  34   Creatinine 0.61 - 1.24 mg/dL 4.09  8.11  9.14   Sodium 135 - 145 mmol/L 136  136  136   Potassium 3.5 - 5.1 mmol/L 3.4  3.5  3.3   Chloride 98 - 111 mmol/L 97  100  98   CO2 22 - 32 mmol/L 27  29  27    Calcium 8.9 - 10.3 mg/dL 8.9  8.7  8.6   Total Protein 6.5 - 8.1 g/dL 7.0     Total Bilirubin <1.2 mg/dL 0.8     Alkaline Phos 38 - 126 U/L 51     AST 15 - 41 U/L 26     ALT 0 - 44 U/L 21       DIAGNOSTIC IMAGING:  I have independently reviewed the relevant imaging and discussed with the patient.   WRAP UP:  All questions were answered. The patient knows to call the clinic with any problems, questions or concerns.  Medical decision making: Moderate  Time spent on visit: I spent 20 minutes counseling the patient face to face. The total time spent in the appointment was 30 minutes and more than 50% was on counseling.  Carnella Guadalajara, PA-C  10/17/23 10:02 AM

## 2023-10-17 ENCOUNTER — Inpatient Hospital Stay: Payer: Medicare HMO

## 2023-10-17 ENCOUNTER — Inpatient Hospital Stay (HOSPITAL_BASED_OUTPATIENT_CLINIC_OR_DEPARTMENT_OTHER): Payer: Medicare HMO | Admitting: Physician Assistant

## 2023-10-17 VITALS — BP 177/94 | HR 75 | Temp 99.0°F | Resp 18 | Ht 69.0 in | Wt 212.0 lb

## 2023-10-17 DIAGNOSIS — I10 Essential (primary) hypertension: Secondary | ICD-10-CM | POA: Diagnosis not present

## 2023-10-17 DIAGNOSIS — N184 Chronic kidney disease, stage 4 (severe): Secondary | ICD-10-CM | POA: Diagnosis not present

## 2023-10-17 DIAGNOSIS — D631 Anemia in chronic kidney disease: Secondary | ICD-10-CM

## 2023-10-17 DIAGNOSIS — D509 Iron deficiency anemia, unspecified: Secondary | ICD-10-CM

## 2023-10-17 LAB — CBC WITH DIFFERENTIAL/PLATELET
Abs Immature Granulocytes: 0.02 10*3/uL (ref 0.00–0.07)
Basophils Absolute: 0 10*3/uL (ref 0.0–0.1)
Basophils Relative: 0 %
Eosinophils Absolute: 0.3 10*3/uL (ref 0.0–0.5)
Eosinophils Relative: 6 %
HCT: 33.1 % — ABNORMAL LOW (ref 39.0–52.0)
Hemoglobin: 10.6 g/dL — ABNORMAL LOW (ref 13.0–17.0)
Immature Granulocytes: 0 %
Lymphocytes Relative: 31 %
Lymphs Abs: 1.6 10*3/uL (ref 0.7–4.0)
MCH: 27.5 pg (ref 26.0–34.0)
MCHC: 32 g/dL (ref 30.0–36.0)
MCV: 86 fL (ref 80.0–100.0)
Monocytes Absolute: 0.5 10*3/uL (ref 0.1–1.0)
Monocytes Relative: 11 %
Neutro Abs: 2.7 10*3/uL (ref 1.7–7.7)
Neutrophils Relative %: 52 %
Platelets: 143 10*3/uL — ABNORMAL LOW (ref 150–400)
RBC: 3.85 MIL/uL — ABNORMAL LOW (ref 4.22–5.81)
RDW: 14.2 % (ref 11.5–15.5)
WBC: 5.1 10*3/uL (ref 4.0–10.5)
nRBC: 0 % (ref 0.0–0.2)

## 2023-10-17 LAB — COMPREHENSIVE METABOLIC PANEL
ALT: 21 U/L (ref 0–44)
AST: 26 U/L (ref 15–41)
Albumin: 3.7 g/dL (ref 3.5–5.0)
Alkaline Phosphatase: 51 U/L (ref 38–126)
Anion gap: 12 (ref 5–15)
BUN: 34 mg/dL — ABNORMAL HIGH (ref 8–23)
CO2: 27 mmol/L (ref 22–32)
Calcium: 8.9 mg/dL (ref 8.9–10.3)
Chloride: 97 mmol/L — ABNORMAL LOW (ref 98–111)
Creatinine, Ser: 2.64 mg/dL — ABNORMAL HIGH (ref 0.61–1.24)
GFR, Estimated: 24 mL/min — ABNORMAL LOW (ref >60)
Glucose, Bld: 157 mg/dL — ABNORMAL HIGH (ref 70–99)
Potassium: 3.4 mmol/L — ABNORMAL LOW (ref 3.5–5.1)
Sodium: 136 mmol/L (ref 135–145)
Total Bilirubin: 0.8 mg/dL (ref <1.2)
Total Protein: 7 g/dL (ref 6.5–8.1)

## 2023-10-17 LAB — IRON AND TIBC
Iron: 90 ug/dL (ref 45–182)
Saturation Ratios: 32 % (ref 17.9–39.5)
TIBC: 283 ug/dL (ref 250–450)
UIBC: 193 ug/dL

## 2023-10-17 LAB — FERRITIN: Ferritin: 244 ng/mL (ref 24–336)

## 2023-10-17 MED ORDER — EPOETIN ALFA 3000 UNIT/ML IJ SOLN
6000.0000 [IU] | Freq: Once | INTRAMUSCULAR | Status: AC
  Administered 2023-10-17: 6000 [IU] via SUBCUTANEOUS
  Filled 2023-10-17: qty 2

## 2023-10-17 NOTE — Patient Instructions (Addendum)
[facility] at [facility] **VISIT SUMMARY & IMPORTANT INSTRUCTIONS **   You were seen today by Rojelio Brenner PA-C for your anemia.   Your anemia is related to your chronic kidney disease. Your blood levels are currently improving. We will continue giving you Epogen injections every 2 weeks.  ELEVATED BLOOD PRESSURE: Please recheck your blood pressure at home and contact your primary care provider if your blood pressure remains >160/90.  If you notice any "red flag" symptoms (abnormal headache, dizziness, blurry vision, strokelike symptoms, chest pain, or difficulty breathing), call 911 and seek immediate medical attention.   FOLLOW-UP APPOINTMENT: Labs and office visit in 3 months  ** Thank you for trusting me with your healthcare!  I strive to provide all of my patients with quality care at each visit.  If you receive a survey for this visit, I would be so grateful to you for taking the time to provide feedback.  Thank you in advance!  ~ Lillien Petronio                   Dr. Doreatha Massed   &   Rojelio Brenner, PA-C   - - - - - - - - - - - - - - - - - -    Thank you for choosing [facility] at [facility] to provide your oncology and hematology care.  To afford each patient quality time with our provider, please arrive at least 15 minutes before your scheduled appointment time.   If you have a lab appointment with the Cancer Center please come in thru the Main Entrance and check in at the main information desk.  You need to re-schedule your appointment should you arrive 10 or more minutes late.  We strive to give you quality time with our providers, and arriving late affects you and other patients whose appointments are after yours.  Also, if you no show three or more times for appointments you may be dismissed from the clinic at the providers discretion.     Again, thank you for choosing [facility].  Our hope is that these  requests will decrease the amount of time that you wait before being seen by our physicians.       _____________________________________________________________  Should you have questions after your visit to [facility], please contact our office at [phone] and follow the prompts.  Our office hours are 8:00 a.m. and 4:30 p.m. Monday - Friday.  Please note that voicemails left after 4:00 p.m. may not be returned until the following business day.  We are closed weekends and major holidays.  You do have access to a nurse 24-7, just call the main number to the clinic [phone] and do not press any options, hold on the line and a nurse will answer the phone.    For prescription refill requests, have your pharmacy contact our office and allow 72 hours.

## 2023-10-17 NOTE — Progress Notes (Signed)
Epogen injection given per orders. Patient tolerated it well without problems. Vitals stable and discharged home from clinic ambulatory. Follow up as scheduled.

## 2023-10-26 ENCOUNTER — Other Ambulatory Visit: Payer: Self-pay

## 2023-10-26 ENCOUNTER — Emergency Department (HOSPITAL_COMMUNITY)
Admission: EM | Admit: 2023-10-26 | Discharge: 2023-10-26 | Disposition: A | Discharge status: Home / Self Care | Payer: Medicare HMO | Attending: Emergency Medicine | Admitting: Emergency Medicine

## 2023-10-26 ENCOUNTER — Encounter (HOSPITAL_COMMUNITY): Payer: Self-pay

## 2023-10-26 DIAGNOSIS — J449 Chronic obstructive pulmonary disease, unspecified: Secondary | ICD-10-CM | POA: Insufficient documentation

## 2023-10-26 DIAGNOSIS — J019 Acute sinusitis, unspecified: Secondary | ICD-10-CM | POA: Diagnosis not present

## 2023-10-26 DIAGNOSIS — N189 Chronic kidney disease, unspecified: Secondary | ICD-10-CM | POA: Insufficient documentation

## 2023-10-26 DIAGNOSIS — I509 Heart failure, unspecified: Secondary | ICD-10-CM | POA: Diagnosis not present

## 2023-10-26 DIAGNOSIS — R0981 Nasal congestion: Secondary | ICD-10-CM | POA: Diagnosis present

## 2023-10-26 DIAGNOSIS — B9689 Other specified bacterial agents as the cause of diseases classified elsewhere: Secondary | ICD-10-CM

## 2023-10-26 DIAGNOSIS — Z7901 Long term (current) use of anticoagulants: Secondary | ICD-10-CM | POA: Insufficient documentation

## 2023-10-26 DIAGNOSIS — J329 Chronic sinusitis, unspecified: Secondary | ICD-10-CM | POA: Insufficient documentation

## 2023-10-26 MED ORDER — AMOXICILLIN-POT CLAVULANATE 500-125 MG PO TABS: 1.0000 | ORAL_TABLET | Freq: Two times a day (BID) | ORAL | 0 refills | Status: AC

## 2023-10-26 MED ORDER — AMOXICILLIN-POT CLAVULANATE 500-125 MG PO TABS
1.0000 | ORAL_TABLET | Freq: Once | ORAL | Status: AC
  Administered 2023-10-26: 1 via ORAL
  Filled 2023-10-26: qty 1

## 2023-10-26 NOTE — ED Triage Notes (Signed)
Pt reports his nose is so congested he has to breathe through his mouth and his BP is elevated.  Pt reports he checks his BP every morning and it has been elevated for quite some time so she changed his meds and it is still elevated.

## 2023-10-26 NOTE — Discharge Instructions (Signed)
Stop taking the decongestants, you can use the Flonase you picked up at the pharmacy today, given prescription for antibiotics.  Follow-up closely with your PCP for blood pressure recheck.  Come back for new or worsening symptoms.

## 2023-10-26 NOTE — ED Provider Notes (Signed)
[place] EMERGENCY DEPARTMENT AT [facility] Provider Note   CSN: [id number] Arrival date & time: 10/26/23  1131     History  Chief Complaint  Patient presents with   Nasal Congestion    Andrew Adams is a 77 y.o. male.  He has PMH of CKD, COPD, PE, CHF.  Presents the ER today complaining of a week and a half of sinus congestion, states he is not able to breathe through his nose.  He had moderate relief with over-the-counter decongestants and yesterday wanted to be pharmacy and pharmacist told him to try Flonase as well.  She does not have any symptoms.  This morning drink his blood pressure and noticed it was in the 180s systolic.  He reports that his blood pressure tends to run high 140s to 150s but is not usually this high and he was worried it was in the ER. He has no chest pain or shortness of breath, numbness tingling or weakness.  HPI     Home Medications Prior to Admission medications   Medication Sig Start Date End Date Taking? Authorizing Provider  amoxicillin-clavulanate (AUGMENTIN) 500-125 MG tablet Take 1 tablet by mouth in the morning and at bedtime for 7 days. 10/26/23 11/02/23 Yes Aaryav Hopfensperger A, PA-C  albuterol (VENTOLIN HFA) 108 (90 Base) MCG/ACT inhaler Inhale 2 puffs into the lungs every 4 (four) hours as needed. 11/08/22   [provider]  allopurinol (ZYLOPRIM) 100 MG tablet Take 1 tablet (100 mg total) by mouth daily. 02/24/14   Daphine Deutscher, Mary-Margaret, FNP  amLODipine (NORVASC) 5 MG tablet Take 5 mg by mouth daily.    [provider]  apixaban (ELIQUIS) 5 MG TABS tablet Take 2 tablets (10 mg total) by mouth 2 (two) times daily. On 08/27/22, start 5 mg  (1 tab) two times daily 08/20/22   Tat, Onalee Hua, MD  atorvastatin (LIPITOR) 40 MG tablet Take 1 tablet (40 mg total) by mouth daily. 02/24/14   Daphine Deutscher, Mary-Margaret, FNP  bisoprolol (ZEBETA) 5 MG tablet Take 1 tablet (5 mg total) by mouth daily. 01/23/23   Mallipeddi, Vishnu P, MD   epoetin alfa (EPOGEN) 3000 UNIT/ML injection Inject into the skin. 02/02/23   [provider]  epoetin alfa-epbx (RETACRIT) 3000 UNIT/ML injection Inject 3,000 Units into the vein every 14 (fourteen) days.    [provider]  FEROSUL 325 (65 Fe) MG tablet TAKE ONE TABLET BY MOUTH EVERY MORNING WITH BREAKFAST 12/20/21   Carlan, Chelsea L, NP  furosemide (LASIX) 40 MG tablet Take 1 tablet (40 mg total) by mouth daily. Patient taking differently: Take 2 tabs (80mg ) by mouth every morning & 1 tab (40mg ) every evening 10/30/22   Tat, Onalee Hua, MD  hydrALAZINE (APRESOLINE) 25 MG tablet Take 1 tablet (25 mg total) by mouth every 6 (six) hours. 10/30/22   Catarina Hartshorn, MD  ipratropium-albuterol (DUONEB) 0.5-2.5 (3) MG/3ML SOLN Take 3 mLs by nebulization every 6 (six) hours as needed. 04/11/23   Parrett, Virgel Bouquet, NP  Multiple Vitamin (MULTIVITAMIN WITH MINERALS) TABS tablet Take 1 tablet by mouth daily. 02/16/20   Johnson, Clanford L, MD  pantoprazole (PROTONIX) 40 MG tablet Take 1 tablet (40 mg total) by mouth 2 (two) times daily before a meal. Patient taking differently: Take 40 mg by mouth daily. 02/07/22   Mansouraty, Netty Starring., MD  potassium chloride (KLOR-CON) 10 MEQ tablet Take 10 mEq by mouth daily. 07/18/22   [provider]  TRULICITY 0.75 MG/0.5ML SOPN Inject 0.75 mg  into the skin every Friday.    [provider]      Allergies    Patient has no known allergies.    Review of Systems   Review of Systems  Physical Exam Updated Vital Signs BP (!) 186/104   Pulse 78   Temp 98 F (36.7 C) (Oral)   Resp (!) 22   Ht 5\' 9"  (1.753 m)   Wt 96.2 kg   SpO2 94%   BMI 31.31 kg/m  Physical Exam Vitals and nursing note reviewed.  Constitutional:      General: He is not in acute distress.    Appearance: He is well-developed.  HENT:     Head: Normocephalic and atraumatic.     Nose: Congestion present.     Right Nostril: No foreign body, epistaxis or septal  hematoma.     Left Nostril: No foreign body, epistaxis or septal hematoma.     Right Turbinates: Swollen.     Left Turbinates: Swollen.     Right Sinus: Maxillary sinus tenderness present.     Left Sinus: Maxillary sinus tenderness present.     Mouth/Throat:     Mouth: Mucous membranes are moist.  Eyes:     Conjunctiva/sclera: Conjunctivae normal.  Cardiovascular:     Rate and Rhythm: Normal rate and regular rhythm.     Heart sounds: No murmur heard. Pulmonary:     Effort: Pulmonary effort is normal. No respiratory distress.     Breath sounds: Normal breath sounds.  Abdominal:     Palpations: Abdomen is soft.     Tenderness: There is no abdominal tenderness.  Musculoskeletal:        General: No swelling.     Cervical back: Neck supple.  Skin:    General: Skin is warm and dry.     Capillary Refill: Capillary refill takes less than 2 seconds.  Neurological:     General: No focal deficit present.     Mental Status: He is alert and oriented to person, place, and time.  Psychiatric:        Mood and Affect: Mood normal.     ED Results / Procedures / Treatments   Labs (all labs ordered are listed, but only abnormal results are displayed) Labs Reviewed - No data to display  EKG None  Radiology No results found.  Procedures Procedures    Medications Ordered in ED Medications  amoxicillin-clavulanate (AUGMENTIN) 500-125 MG per tablet 1 tablet (1 tablet Oral Given 10/26/23 1224)    ED Course/ Medical Decision Making/ A&P                                 Medical Decision Making Differential diagnosis includes but not limited to viral syndrome, bacterial sinusitis, allergic rhinitis, other  ED course: Patient presents to ER complaining of sinus congestion pressure for about a week and a half, he has been taking over-the-counter decongestants without relief, started Flonase yesterday as well.  No trouble swallowing or breathing, no fevers or chills, no chest pain or  shortness of breath.  Noted today blood pressure was elevated.  He is not having any associated symptoms with this.  Discussed this may be due to the over-the-counter decongestants he was taking and advised to discontinue these and follow-up closely with PCP for blood pressure recheck but certainly come back if he develops symptoms such as severe headache, chest pain or trouble breathing, tingling or weakness  or any other new or worsening symptoms.  Given the duration of his sinusitis symptoms we will treat with antibiotics which for dose based on recent renal function shows GFR of 24.  Considered testing for COVID flu and RSV but her symptoms have been going on long enough that testing would not change management.  Do not feel any further testing is needed.  Vitals are reassuring.  He is satting 94% on room air, is mostly on baseline 2 L    Risk Prescription drug management.           Final Clinical Impression(s) / ED Diagnoses Final diagnoses:  Bacterial sinusitis    Rx / DC Orders ED Discharge Orders          Ordered    amoxicillin-clavulanate (AUGMENTIN) 500-125 MG tablet  2 times daily        10/26/23 [address] 10/26/23 1242    Cathren Laine, MD 10/26/23 1654

## 2023-10-31 ENCOUNTER — Ambulatory Visit: Payer: Medicare HMO

## 2023-10-31 ENCOUNTER — Other Ambulatory Visit: Payer: Medicare HMO

## 2023-10-31 DIAGNOSIS — J449 Chronic obstructive pulmonary disease, unspecified: Secondary | ICD-10-CM | POA: Diagnosis not present

## 2023-11-02 ENCOUNTER — Encounter: Payer: Self-pay | Admitting: Hematology

## 2023-11-07 ENCOUNTER — Encounter: Payer: Self-pay | Admitting: Hematology

## 2023-11-14 ENCOUNTER — Other Ambulatory Visit: Payer: Self-pay | Admitting: Physician Assistant

## 2023-11-14 ENCOUNTER — Other Ambulatory Visit: Payer: Self-pay

## 2023-11-14 ENCOUNTER — Inpatient Hospital Stay: Payer: Medicare Other | Attending: Hematology

## 2023-11-14 ENCOUNTER — Inpatient Hospital Stay: Payer: Medicare Other

## 2023-11-14 VITALS — BP 168/89 | HR 76 | Temp 98.3°F | Resp 18 | Wt 220.4 lb

## 2023-11-14 DIAGNOSIS — Z79899 Other long term (current) drug therapy: Secondary | ICD-10-CM | POA: Diagnosis not present

## 2023-11-14 DIAGNOSIS — N184 Chronic kidney disease, stage 4 (severe): Secondary | ICD-10-CM | POA: Insufficient documentation

## 2023-11-14 DIAGNOSIS — D631 Anemia in chronic kidney disease: Secondary | ICD-10-CM | POA: Insufficient documentation

## 2023-11-14 DIAGNOSIS — D509 Iron deficiency anemia, unspecified: Secondary | ICD-10-CM

## 2023-11-14 LAB — CBC
HCT: 28.9 % — ABNORMAL LOW (ref 39.0–52.0)
Hemoglobin: 9.3 g/dL — ABNORMAL LOW (ref 13.0–17.0)
MCH: 28.7 pg (ref 26.0–34.0)
MCHC: 32.2 g/dL (ref 30.0–36.0)
MCV: 89.2 fL (ref 80.0–100.0)
Platelets: 141 K/uL — ABNORMAL LOW (ref 150–400)
RBC: 3.24 MIL/uL — ABNORMAL LOW (ref 4.22–5.81)
RDW: 14.1 % (ref 11.5–15.5)
WBC: 4.7 K/uL (ref 4.0–10.5)
nRBC: 0 % (ref 0.0–0.2)

## 2023-11-14 LAB — RENAL FUNCTION PANEL
Albumin: 3.5 g/dL (ref 3.5–5.0)
Anion gap: 10 (ref 5–15)
BUN: 37 mg/dL — ABNORMAL HIGH (ref 8–23)
CO2: 29 mmol/L (ref 22–32)
Calcium: 8 mg/dL — ABNORMAL LOW (ref 8.9–10.3)
Chloride: 99 mmol/L (ref 98–111)
Creatinine, Ser: 2.68 mg/dL — ABNORMAL HIGH (ref 0.61–1.24)
GFR, Estimated: 23 mL/min — ABNORMAL LOW (ref >60)
Glucose, Bld: 156 mg/dL — ABNORMAL HIGH (ref 70–99)
Phosphorus: 3.1 mg/dL (ref 2.5–4.6)
Potassium: 3.4 mmol/L — ABNORMAL LOW (ref 3.5–5.1)
Sodium: 138 mmol/L (ref 135–145)

## 2023-11-14 LAB — CREATININE, URINE, RANDOM: Creatinine, Urine: 53 mg/dL

## 2023-11-14 LAB — PROTEIN, URINE, RANDOM: Total Protein, Urine: 51 mg/dL

## 2023-11-14 MED ORDER — EPOETIN ALFA-EPBX 3000 UNIT/ML IJ SOLN
6000.0000 [IU] | Freq: Once | INTRAMUSCULAR | Status: AC
  Administered 2023-11-14: 6000 [IU] via SUBCUTANEOUS
  Filled 2023-11-14: qty 2

## 2023-11-14 NOTE — Patient Instructions (Signed)

## 2023-11-14 NOTE — Progress Notes (Signed)
 Patient insurance changed to [facility], so now his preferred ESA is Retacrit [id number] or Aranesp [id number].  Carnella Guadalajara, PA-C 11/14/23 12:04 PM

## 2023-11-14 NOTE — Progress Notes (Signed)
 Patient tolerated injection with no complaints voiced.  Site clean and dry with no bruising or swelling noted at site.  See MAR for details.  Band aid applied.  Patient stable during and after injection.  Vss with discharge and left in satisfactory condition with no s/s of distress noted.

## 2023-11-16 LAB — PTH, INTACT AND CALCIUM
Calcium, Total (PTH): 8 mg/dL — ABNORMAL LOW (ref 8.6–10.2)
PTH: 54 pg/mL (ref 15–65)

## 2023-11-22 ENCOUNTER — Ambulatory Visit: Payer: Medicare Other | Admitting: Primary Care

## 2023-11-22 VITALS — BP 150/75 | HR 86 | Ht 69.0 in | Wt 211.0 lb

## 2023-11-22 DIAGNOSIS — G4734 Idiopathic sleep related nonobstructive alveolar hypoventilation: Secondary | ICD-10-CM | POA: Diagnosis not present

## 2023-11-22 DIAGNOSIS — G4733 Obstructive sleep apnea (adult) (pediatric): Secondary | ICD-10-CM | POA: Diagnosis not present

## 2023-11-22 NOTE — Progress Notes (Signed)
@Patient  ID: Andrew Adams, male    DOB: January 19, 1946, 78 y.o.   MRN: [id number]  Chief Complaint  Patient presents with   Follow-up  Referring provider: Lovey Newcomer, PA  HPI: 78 year old male, former smoker. PMH significant for COPD mixed type, chronic respiratory failure, HTN, GERD, CKD. Former patient of Dr. Craige Cotta. PSG 05/31/20>> AHI 29.4, SpO2 low 73%. Used 2L oxygen.   11/22/2023 Patient is doing well. No acute respiratory complaints. He is compliant with CPAP. He would like to discontinue oxygen. He had an ONO in 2022 on CPAP with room air that showed he spent 1 hour with SpO2 <88%. He has not been wearing oxygen at night. We discussed need for repeating testing. He willl also be walked in office today to assess oxygen needs. CT chest in July showed stable lung nodules, needs repeat imaging in 1 year. Denies weight loss or hemotysis.   Airview download 10/22/23-11/20/23 Usage days 29/30 days >4 hours Average usage 8 hours 33 minutes Pressure 5 to 20 cm H2O (12.6 cm H2O-95%) Air leaks 12.8 L/min (95%) AHI 0.2  No Known Allergies  Immunization History  Administered  Date(s) Administered   Moderna Sars-Covid-2 Vaccination 11/12/2019, 12/13/2019    Past Medical History:  Diagnosis Date   Anemia    Borderline diabetic    Chronic kidney disease    Diabetes mellitus without complication (HCC)    Duodenal adenoma    2021   GERD (gastroesophageal reflux disease)    Gout    Gouty arthritis    H/O small bowel obstruction    History of back surgery    Hx of adenomatous colonic polyps    2021   Hyperlipidemia    Hypertension    PE (pulmonary embolism)    Sleep apnea     Tobacco History: Social History   Tobacco Use  Smoking Status Former   Current packs/day: 0.00   Average packs/day: 1 pack/day for 38.0 years (38.0 ttl pk-yrs)   Types: Cigarettes   Start date: 02/06/1957   Quit date: 02/07/1995   Years since quitting: 28.8  Smokeless Tobacco Never   Counseling given: Not Answered   Outpatient Medications Prior to Visit  Medication Sig Dispense Refill   albuterol (VENTOLIN HFA) 108 (90 Base) MCG/ACT inhaler Inhale 2 puffs into the lungs every 4 (four) hours as needed.     allopurinol (ZYLOPRIM) 100 MG tablet Take 1 tablet (100 mg total) by mouth daily. 90 tablet 1   amLODipine (NORVASC) 5 MG tablet Take 5 mg by mouth daily.     apixaban (ELIQUIS) 5 MG TABS tablet Take 2 tablets (10 mg total) by mouth 2 (two) times daily. On 08/27/22, start 5 mg  (1 tab) two times daily 74 tablet 1   atorvastatin (LIPITOR) 40 MG tablet Take 1 tablet (40 mg total) by mouth daily. 90 tablet 1   bisoprolol (ZEBETA) 5 MG tablet Take 1 tablet (5 mg total) by mouth daily. 90 tablet 3   epoetin alfa (EPOGEN) 3000 UNIT/ML injection Inject into the skin.     epoetin alfa-epbx (RETACRIT) 3000 UNIT/ML injection Inject 3,000 Units into the vein every 14 (fourteen) days.     FEROSUL 325 (65 Fe) MG tablet TAKE ONE TABLET BY MOUTH EVERY MORNING WITH BREAKFAST 90 tablet 3   furosemide (LASIX) 40 MG tablet Take 1 tablet (40 mg total) by mouth daily. (Patient taking  differently: Take 2 tabs (80mg ) by mouth every morning & 1 tab (40mg ) every evening) 30 tablet 1   hydrALAZINE (APRESOLINE) 25 MG tablet Take 1 tablet (25 mg total) by mouth every 6 (six) hours. 90 tablet 1   ipratropium-albuterol (  DUONEB) 0.5-2.5 (3) MG/3ML SOLN Take 3 mLs by nebulization every 6 (six) hours as needed. 360 mL 5   Multiple Vitamin (MULTIVITAMIN WITH MINERALS) TABS tablet Take 1 tablet by mouth daily.     pantoprazole (PROTONIX) 40 MG tablet Take 1 tablet (40 mg total) by mouth 2 (two) times daily before a meal. (Patient taking differently: Take 40 mg by mouth daily.) 60 tablet 12   potassium chloride (KLOR-CON) 10 MEQ tablet Take 10 mEq by mouth daily.     TRULICITY 0.75 MG/0.5ML SOPN Inject 0.75 mg into the skin every Friday.     No facility-administered medications prior to visit.   Review of Systems  Review of Systems  Constitutional: Negative.   HENT: Negative.    Respiratory: Negative.  Negative for cough, chest tightness and shortness of breath.    Physical Exam  BP (!) 150/75   Pulse 86   Ht 5\' 9"  (1.753 m)   Wt 211 lb (95.7 kg)   SpO2 93%   BMI 31.16 kg/m  Physical Exam Constitutional:      Appearance: Normal appearance. He is not ill-appearing.  HENT:     Head: Normocephalic and atraumatic.  Cardiovascular:     Rate and Rhythm: Normal rate and regular rhythm.  Pulmonary:     Effort: Pulmonary effort is normal.     Breath sounds: Normal breath sounds. No wheezing, rhonchi or rales.     Comments: Mild dyspnea with exertion Musculoskeletal:        General: Normal range of motion.  Skin:    General: Skin is warm and dry.  Neurological:     General: No focal deficit present.     Mental Status: He is alert and oriented to person, place, and time. Mental status is at baseline.  Psychiatric:        Mood and Affect: Mood normal.        Behavior: Behavior normal.        Thought Content: Thought content normal.        Judgment: Judgment normal.       Lab Results:  CBC    Component Value Date/Time   WBC 4.7 11/14/2023 0842   RBC 3.24 (L) 11/14/2023 0842   HGB 9.3 (L) 11/14/2023 0842   HCT 28.9 (L) 11/14/2023 0842   PLT 141 (L) 11/14/2023 0842   MCV 89.2 11/14/2023 0842   MCV 84.2 04/12/2013 1501   MCH 28.7 11/14/2023 0842   MCHC 32.2 11/14/2023 0842   RDW 14.1 11/14/2023 0842   LYMPHSABS 1.6 10/17/2023 0755   MONOABS 0.5 10/17/2023 0755   EOSABS 0.3 10/17/2023 0755   BASOSABS 0.0 10/17/2023 0755    BMET    Component Value Date/Time   NA 138 11/14/2023 0849   NA 139 02/24/2014 1030   K 3.4 (L) 11/14/2023 0849   CL 99 11/14/2023 0849   CO2 29 11/14/2023 0849   GLUCOSE 156 (H) 11/14/2023 0849   BUN 37 (H) 11/14/2023 0849   BUN 10 02/24/2014 1030   CREATININE 2.68 (H) 11/14/2023 0849   CREATININE 1.66 (H) 05/07/2013 0942   CALCIUM 8.0 (L) 11/14/2023 0849   CALCIUM 8.0 (L) 11/14/2023 0849   GFRNONAA 23 (L) 11/14/2023 0849   GFRNONAA 41 (L) 05/07/2013 0942   GFRAA 30 (L) 06/19/2020 1102   GFRAA 48 (L) 05/07/2013 0942    BNP    Component Value Date/Time   BNP 259.0 (H) 10/27/2022 0421    ProBNP No results found for: "PROBNP"  Imaging: No results found.   Assessment & Plan:   1. OSA (obstructive sleep apnea) (Primary) - Pulse oximetry, overnight; Future  2. Nocturnal hypoxemia - Pulse oximetry, overnight; Future   OSA - Patient is compliant with CPAP. Current pressure 5-20cm h20 with residual AHI 0.2/hour - Need to repeat ONO to assess oxygen levels while on PAP therapy  Lung nodules - Stable, due for repeat CT chest in July 2025   Glenford Bayley, NP 11/22/2023

## 2023-11-22 NOTE — Patient Instructions (Signed)
Continue to wear CPAP nightly 4-6 hours or longer  We will get overnight oximetry test to assess whether we can safely discontinue oxygen- please do test at night when wearing CPAP (no oxygen)  You are due for repeat CT chest in July 2025 to monitor lung nodules   Follow-up 6 months with APP

## 2023-11-28 ENCOUNTER — Inpatient Hospital Stay: Payer: Medicare Other

## 2023-11-28 VITALS — BP 154/87 | HR 87 | Temp 98.7°F | Resp 19

## 2023-11-28 DIAGNOSIS — D631 Anemia in chronic kidney disease: Secondary | ICD-10-CM

## 2023-11-28 DIAGNOSIS — D509 Iron deficiency anemia, unspecified: Secondary | ICD-10-CM

## 2023-11-28 DIAGNOSIS — N184 Chronic kidney disease, stage 4 (severe): Secondary | ICD-10-CM | POA: Diagnosis not present

## 2023-11-28 LAB — CBC
HCT: 30.2 % — ABNORMAL LOW (ref 39.0–52.0)
Hemoglobin: 10.1 g/dL — ABNORMAL LOW (ref 13.0–17.0)
MCH: 27.9 pg (ref 26.0–34.0)
MCHC: 33.4 g/dL (ref 30.0–36.0)
MCV: 83.4 fL (ref 80.0–100.0)
Platelets: 151 10*3/uL (ref 150–400)
RBC: 3.62 MIL/uL — ABNORMAL LOW (ref 4.22–5.81)
RDW: 14.4 % (ref 11.5–15.5)
WBC: 4.4 10*3/uL (ref 4.0–10.5)
nRBC: 0 % (ref 0.0–0.2)

## 2023-11-28 MED ORDER — EPOETIN ALFA-EPBX 3000 UNIT/ML IJ SOLN
6000.0000 [IU] | Freq: Once | INTRAMUSCULAR | Status: AC
Start: 2023-11-28 — End: 2023-11-28
  Administered 2023-11-28: 6000 [IU] via SUBCUTANEOUS
  Filled 2023-11-28: qty 2

## 2023-11-28 NOTE — Patient Instructions (Signed)

## 2023-11-28 NOTE — Progress Notes (Signed)
Patient's hgb 10.1 and BP stable. Pt tolerated Retacrit injection with no complaints voiced.  Site clean and dry with no bruising or swelling noted at site.  See MAR for details.  Band aid applied.  Patient stable during and after injection.  Vss with discharge and left in satisfactory condition with no s/s of distress noted. All follow ups as scheduled.   Andrew Adams [employer]

## 2023-12-12 ENCOUNTER — Inpatient Hospital Stay: Payer: Medicare Other | Attending: Hematology

## 2023-12-12 ENCOUNTER — Inpatient Hospital Stay: Payer: Medicare Other

## 2023-12-12 VITALS — BP 151/87 | HR 78 | Resp 16

## 2023-12-12 DIAGNOSIS — D631 Anemia in chronic kidney disease: Secondary | ICD-10-CM | POA: Diagnosis not present

## 2023-12-12 DIAGNOSIS — I129 Hypertensive chronic kidney disease with stage 1 through stage 4 chronic kidney disease, or unspecified chronic kidney disease: Secondary | ICD-10-CM | POA: Diagnosis present

## 2023-12-12 DIAGNOSIS — D509 Iron deficiency anemia, unspecified: Secondary | ICD-10-CM

## 2023-12-12 DIAGNOSIS — Z79899 Other long term (current) drug therapy: Secondary | ICD-10-CM | POA: Diagnosis not present

## 2023-12-12 DIAGNOSIS — N184 Chronic kidney disease, stage 4 (severe): Secondary | ICD-10-CM | POA: Insufficient documentation

## 2023-12-12 LAB — CBC
HCT: 29.5 % — ABNORMAL LOW (ref 39.0–52.0)
Hemoglobin: 10.2 g/dL — ABNORMAL LOW (ref 13.0–17.0)
MCH: 29.5 pg (ref 26.0–34.0)
MCHC: 34.6 g/dL (ref 30.0–36.0)
MCV: 85.3 fL (ref 80.0–100.0)
Platelets: 132 10*3/uL — ABNORMAL LOW (ref 150–400)
RBC: 3.46 MIL/uL — ABNORMAL LOW (ref 4.22–5.81)
RDW: 14 % (ref 11.5–15.5)
WBC: 4.4 10*3/uL (ref 4.0–10.5)
nRBC: 0 % (ref 0.0–0.2)

## 2023-12-12 MED ORDER — EPOETIN ALFA-EPBX 3000 UNIT/ML IJ SOLN
6000.0000 [IU] | Freq: Once | INTRAMUSCULAR | Status: AC
  Administered 2023-12-12: 6000 [IU] via SUBCUTANEOUS
  Filled 2023-12-12: qty 2

## 2023-12-12 NOTE — Progress Notes (Signed)
Retacrit injection given per orders. Patient tolerated it well without problems. Vitals stable and discharged home from clinic ambulatory. Follow up as scheduled.

## 2023-12-12 NOTE — Patient Instructions (Signed)
[facility] [place] - A DEPT OF [facility]  Discharge Instructions: Thank you for choosing [facility] to provide your oncology and hematology care.  If you have a lab appointment with the Cancer Center - please note that after April 8th, 2024, all labs will be drawn in the cancer center.  You do not have to check in or register with the main entrance as you have in the past but will complete your check-in in the cancer center.  Wear comfortable clothing and clothing appropriate for easy access to any Portacath or PICC line.   We strive to give you quality time with your provider. You may need to reschedule your appointment if you arrive late (15 or more minutes).  Arriving late affects you and other patients whose appointments are after yours.  Also, if you miss three or more appointments without notifying the office, you may be dismissed from the clinic at the provider's discretion.      For prescription refill requests, have your pharmacy contact our office and allow 72 hours for refills to be completed.    Today you received the following retacrit injection   To help prevent nausea and vomiting after your treatment, we encourage you to take your nausea medication as directed.  BELOW ARE SYMPTOMS THAT SHOULD BE REPORTED IMMEDIATELY: *FEVER GREATER THAN 100.4 F (38 C) OR HIGHER *CHILLS OR SWEATING *NAUSEA AND VOMITING THAT IS NOT CONTROLLED WITH YOUR NAUSEA MEDICATION *UNUSUAL SHORTNESS OF BREATH *UNUSUAL BRUISING OR BLEEDING *URINARY PROBLEMS (pain or burning when urinating, or frequent urination) *BOWEL PROBLEMS (unusual diarrhea, constipation, pain near the anus) TENDERNESS IN MOUTH AND THROAT WITH OR WITHOUT PRESENCE OF ULCERS (sore throat, sores in mouth, or a toothache) UNUSUAL RASH, SWELLING OR PAIN  UNUSUAL VAGINAL DISCHARGE OR ITCHING   Items with * indicate a potential emergency and should be followed up as soon as possible or go to the  Emergency Department if any problems should occur.  Please show the CHEMOTHERAPY ALERT CARD or IMMUNOTHERAPY ALERT CARD at check-in to the Emergency Department and triage nurse.  Should you have questions after your visit or need to cancel or reschedule your appointment, please contact [facility] [place] - A DEPT OF [facility] Brideg[facility] [phone]  and follow the prompts.  Office hours are 8:00 a.m. to 4:30 p.m. Monday - Friday. Please note that voicemails left after 4:00 p.m. may not be returned until the following business day.  We are closed weekends and major holidays. You have access to a nurse at all times for urgent questions. Please call the main number to the clinic [phone] and follow the prompts.  For any non-urgent questions, you may also contact your provider using MyChart. We now offer e-Visits for anyone 15 and older to request care online for non-urgent symptoms. For details visit [email or web address].   Also download the MyChart app! Go to the app store, search "MyChart", open the app, select [place], and log in with your MyChart username and password.

## 2023-12-21 ENCOUNTER — Telehealth: Payer: Self-pay | Admitting: Primary Care

## 2023-12-21 DIAGNOSIS — G4733 Obstructive sleep apnea (adult) (pediatric): Secondary | ICD-10-CM

## 2023-12-21 DIAGNOSIS — G4734 Idiopathic sleep related nonobstructive alveolar hypoventilation: Secondary | ICD-10-CM

## 2023-12-21 NOTE — Telephone Encounter (Signed)
Overnight oximetry test on 12/18/2023 showed patient spent 3 hours 49 minutes with SpO2 load less than 88%.  Patient needs a in-lab CPAP titration study to establish supplemental oxygen need. I will place order, please notify patient.

## 2023-12-22 NOTE — Telephone Encounter (Signed)
Left detailed message on VM for patient to call clinic to give information per [facility].

## 2023-12-26 ENCOUNTER — Inpatient Hospital Stay: Payer: Medicare Other

## 2023-12-26 VITALS — BP 157/104 | HR 100 | Temp 97.9°F | Resp 18

## 2023-12-26 DIAGNOSIS — D631 Anemia in chronic kidney disease: Secondary | ICD-10-CM

## 2023-12-26 DIAGNOSIS — I129 Hypertensive chronic kidney disease with stage 1 through stage 4 chronic kidney disease, or unspecified chronic kidney disease: Secondary | ICD-10-CM | POA: Diagnosis not present

## 2023-12-26 DIAGNOSIS — D509 Iron deficiency anemia, unspecified: Secondary | ICD-10-CM

## 2023-12-26 LAB — CBC
HCT: 29.1 % — ABNORMAL LOW (ref 39.0–52.0)
Hemoglobin: 9.5 g/dL — ABNORMAL LOW (ref 13.0–17.0)
MCH: 28.4 pg (ref 26.0–34.0)
MCHC: 32.6 g/dL (ref 30.0–36.0)
MCV: 87.1 fL (ref 80.0–100.0)
Platelets: 141 K/uL — ABNORMAL LOW (ref 150–400)
RBC: 3.34 MIL/uL — ABNORMAL LOW (ref 4.22–5.81)
RDW: 14.2 % (ref 11.5–15.5)
WBC: 4.9 K/uL (ref 4.0–10.5)
nRBC: 0 % (ref 0.0–0.2)

## 2023-12-26 MED ORDER — EPOETIN ALFA-EPBX 3000 UNIT/ML IJ SOLN
6000.0000 [IU] | Freq: Once | INTRAMUSCULAR | Status: AC
  Administered 2023-12-26: 6000 [IU] via SUBCUTANEOUS
  Filled 2023-12-26: qty 2

## 2023-12-26 NOTE — Telephone Encounter (Signed)
 I called and informed pt of Beth's note. Pt verbalized understanding. NFN

## 2023-12-26 NOTE — Progress Notes (Signed)
 Clint Guy presents today for injection per the provider's orders. Retacrit 6,000U administration without incident; injection site WNL; see MAR for injection details.  Patient tolerated procedure well and without incident.  No questions or complaints noted at this time. Patient's hemoglobin noted to be 9.5 today.  Discharged from clinic ambulatory in stable condition. Alert and oriented x 3. F/U with [facility] as scheduled.

## 2023-12-26 NOTE — Patient Instructions (Signed)
 [facility] [place] - [facility]  Discharge Instructions: Thank you for choosing [facility] to provide your oncology and hematology care.  If you have a lab appointment with the Cancer Center - please note that after April 8th, 2024, all labs will be drawn in the cancer center.  You do not have to check in or register with the main entrance as you have in the past but will complete your check-in in the cancer center.  Wear comfortable clothing and clothing appropriate for easy access to any Portacath or PICC line.   We strive to give you quality time with your provider. You may need to reschedule your appointment if you arrive late (15 or more minutes).  Arriving late affects you and other patients whose appointments are after yours.  Also, if you miss three or more appointments without notifying the office, you may be dismissed from the clinic at the provider's discretion.      For prescription refill requests, have your pharmacy contact our office and allow 72 hours for refills to be completed.    Today you received Retacrit 6,000U injection.    BELOW ARE SYMPTOMS THAT SHOULD BE REPORTED IMMEDIATELY: *FEVER GREATER THAN 100.4 F (38 C) OR HIGHER *CHILLS OR SWEATING *NAUSEA AND VOMITING THAT IS NOT CONTROLLED WITH YOUR NAUSEA MEDICATION *UNUSUAL SHORTNESS OF BREATH *UNUSUAL BRUISING OR BLEEDING *URINARY PROBLEMS (pain or burning when urinating, or frequent urination) *BOWEL PROBLEMS (unusual diarrhea, constipation, pain near the anus) TENDERNESS IN MOUTH AND THROAT WITH OR WITHOUT PRESENCE OF ULCERS (sore throat, sores in mouth, or a toothache) UNUSUAL RASH, SWELLING OR PAIN  UNUSUAL VAGINAL DISCHARGE OR ITCHING   Items with * indicate a potential emergency and should be followed up as soon as possible or go to the Emergency Department if any problems should occur.  Please show the CHEMOTHERAPY ALERT CARD or IMMUNOTHERAPY ALERT CARD at  check-in to the Emergency Department and triage nurse.  Should you have questions after your visit or need to cancel or reschedule your appointment, please contact [facility] [place] - A DEPT OF [facility] [phone]  and follow the prompts.  Office hours are 8:00 a.m. to 4:30 p.m. Monday - Friday. Please note that voicemails left after 4:00 p.m. may not be returned until the following business day.  We are closed weekends and major holidays. You have access to a nurse at all times for urgent questions. Please call the main number to the clinic [phone] and follow the prompts.  For any non-urgent questions, you may also contact your provider using MyChart. We now offer e-Visits for anyone 32 and older to request care online for non-urgent symptoms. For details visit [email or web address].   Also download the MyChart app! Go to the app store, search "MyChart", open the app, select [place], and log in with your MyChart username and password.

## 2024-01-01 DIAGNOSIS — H353131 Nonexudative age-related macular degeneration, bilateral, early dry stage: Secondary | ICD-10-CM | POA: Diagnosis not present

## 2024-01-01 DIAGNOSIS — Z961 Presence of intraocular lens: Secondary | ICD-10-CM | POA: Diagnosis not present

## 2024-01-01 DIAGNOSIS — H18413 Arcus senilis, bilateral: Secondary | ICD-10-CM | POA: Diagnosis not present

## 2024-01-01 DIAGNOSIS — H35371 Puckering of macula, right eye: Secondary | ICD-10-CM | POA: Diagnosis not present

## 2024-01-02 ENCOUNTER — Encounter: Payer: Self-pay | Admitting: Primary Care

## 2024-01-09 ENCOUNTER — Inpatient Hospital Stay: Payer: Medicare Other | Attending: Hematology

## 2024-01-09 ENCOUNTER — Other Ambulatory Visit: Payer: Self-pay

## 2024-01-09 ENCOUNTER — Inpatient Hospital Stay: Payer: Medicare Other

## 2024-01-09 VITALS — BP 150/93 | HR 64 | Temp 97.1°F | Resp 18

## 2024-01-09 DIAGNOSIS — E785 Hyperlipidemia, unspecified: Secondary | ICD-10-CM | POA: Diagnosis not present

## 2024-01-09 DIAGNOSIS — Z86711 Personal history of pulmonary embolism: Secondary | ICD-10-CM | POA: Diagnosis not present

## 2024-01-09 DIAGNOSIS — R0609 Other forms of dyspnea: Secondary | ICD-10-CM | POA: Diagnosis not present

## 2024-01-09 DIAGNOSIS — N184 Chronic kidney disease, stage 4 (severe): Secondary | ICD-10-CM

## 2024-01-09 DIAGNOSIS — E1122 Type 2 diabetes mellitus with diabetic chronic kidney disease: Secondary | ICD-10-CM | POA: Insufficient documentation

## 2024-01-09 DIAGNOSIS — M109 Gout, unspecified: Secondary | ICD-10-CM | POA: Insufficient documentation

## 2024-01-09 DIAGNOSIS — K317 Polyp of stomach and duodenum: Secondary | ICD-10-CM | POA: Diagnosis not present

## 2024-01-09 DIAGNOSIS — Z87891 Personal history of nicotine dependence: Secondary | ICD-10-CM | POA: Insufficient documentation

## 2024-01-09 DIAGNOSIS — D509 Iron deficiency anemia, unspecified: Secondary | ICD-10-CM | POA: Insufficient documentation

## 2024-01-09 DIAGNOSIS — G4730 Sleep apnea, unspecified: Secondary | ICD-10-CM | POA: Diagnosis not present

## 2024-01-09 DIAGNOSIS — I129 Hypertensive chronic kidney disease with stage 1 through stage 4 chronic kidney disease, or unspecified chronic kidney disease: Secondary | ICD-10-CM | POA: Insufficient documentation

## 2024-01-09 DIAGNOSIS — Z860101 Personal history of adenomatous and serrated colon polyps: Secondary | ICD-10-CM | POA: Insufficient documentation

## 2024-01-09 DIAGNOSIS — Z7901 Long term (current) use of anticoagulants: Secondary | ICD-10-CM | POA: Insufficient documentation

## 2024-01-09 DIAGNOSIS — Z79899 Other long term (current) drug therapy: Secondary | ICD-10-CM | POA: Diagnosis not present

## 2024-01-09 DIAGNOSIS — Z7985 Long-term (current) use of injectable non-insulin antidiabetic drugs: Secondary | ICD-10-CM | POA: Diagnosis not present

## 2024-01-09 DIAGNOSIS — D631 Anemia in chronic kidney disease: Secondary | ICD-10-CM | POA: Diagnosis not present

## 2024-01-09 DIAGNOSIS — N183 Chronic kidney disease, stage 3 unspecified: Secondary | ICD-10-CM

## 2024-01-09 LAB — FERRITIN: Ferritin: 166 ng/mL (ref 24–336)

## 2024-01-09 LAB — CBC WITH DIFFERENTIAL/PLATELET
Abs Immature Granulocytes: 0.02 10*3/uL (ref 0.00–0.07)
Basophils Absolute: 0 10*3/uL (ref 0.0–0.1)
Basophils Relative: 1 %
Eosinophils Absolute: 0.2 10*3/uL (ref 0.0–0.5)
Eosinophils Relative: 4 %
HCT: 28.9 % — ABNORMAL LOW (ref 39.0–52.0)
Hemoglobin: 9.4 g/dL — ABNORMAL LOW (ref 13.0–17.0)
Immature Granulocytes: 0 %
Lymphocytes Relative: 22 %
Lymphs Abs: 1.1 10*3/uL (ref 0.7–4.0)
MCH: 28.5 pg (ref 26.0–34.0)
MCHC: 32.5 g/dL (ref 30.0–36.0)
MCV: 87.6 fL (ref 80.0–100.0)
Monocytes Absolute: 0.5 10*3/uL (ref 0.1–1.0)
Monocytes Relative: 11 %
Neutro Abs: 3.1 10*3/uL (ref 1.7–7.7)
Neutrophils Relative %: 62 %
Platelets: 158 10*3/uL (ref 150–400)
RBC: 3.3 MIL/uL — ABNORMAL LOW (ref 4.22–5.81)
RDW: 14 % (ref 11.5–15.5)
WBC: 4.9 10*3/uL (ref 4.0–10.5)
nRBC: 0 % (ref 0.0–0.2)

## 2024-01-09 LAB — RENAL FUNCTION PANEL
Albumin: 3.6 g/dL (ref 3.5–5.0)
Anion gap: 10 (ref 5–15)
BUN: 36 mg/dL — ABNORMAL HIGH (ref 8–23)
CO2: 23 mmol/L (ref 22–32)
Calcium: 8.5 mg/dL — ABNORMAL LOW (ref 8.9–10.3)
Chloride: 101 mmol/L (ref 98–111)
Creatinine, Ser: 2.85 mg/dL — ABNORMAL HIGH (ref 0.61–1.24)
GFR, Estimated: 22 mL/min — ABNORMAL LOW (ref >60)
Glucose, Bld: 164 mg/dL — ABNORMAL HIGH (ref 70–99)
Phosphorus: 4.1 mg/dL (ref 2.5–4.6)
Potassium: 4.2 mmol/L (ref 3.5–5.1)
Sodium: 134 mmol/L — ABNORMAL LOW (ref 135–145)

## 2024-01-09 LAB — BASIC METABOLIC PANEL
Anion gap: 11 (ref 5–15)
BUN: 37 mg/dL — ABNORMAL HIGH (ref 8–23)
CO2: 23 mmol/L (ref 22–32)
Calcium: 8.5 mg/dL — ABNORMAL LOW (ref 8.9–10.3)
Chloride: 101 mmol/L (ref 98–111)
Creatinine, Ser: 2.86 mg/dL — ABNORMAL HIGH (ref 0.61–1.24)
GFR, Estimated: 22 mL/min — ABNORMAL LOW (ref >60)
Glucose, Bld: 166 mg/dL — ABNORMAL HIGH (ref 70–99)
Potassium: 4.2 mmol/L (ref 3.5–5.1)
Sodium: 135 mmol/L (ref 135–145)

## 2024-01-09 LAB — IRON AND TIBC
Iron: 65 ug/dL (ref 45–182)
Saturation Ratios: 23 % (ref 17.9–39.5)
TIBC: 280 ug/dL (ref 250–450)
UIBC: 215 ug/dL

## 2024-01-09 LAB — CREATININE, URINE, RANDOM: Creatinine, Urine: 27 mg/dL

## 2024-01-09 MED ORDER — EPOETIN ALFA-EPBX 3000 UNIT/ML IJ SOLN
6000.0000 [IU] | Freq: Once | INTRAMUSCULAR | Status: AC
  Administered 2024-01-09: 6000 [IU] via SUBCUTANEOUS
  Filled 2024-01-09: qty 2

## 2024-01-09 NOTE — Patient Instructions (Signed)
 [facility] [place] - [facility]  Discharge Instructions: Thank you for choosing [facility] to provide your oncology and hematology care.  If you have a lab appointment with the Cancer Center - please note that after April 8th, 2024, all labs will be drawn in the cancer center.  You do not have to check in or register with the main entrance as you have in the past but will complete your check-in in the cancer center.  Wear comfortable clothing and clothing appropriate for easy access to any Portacath or PICC line.   We strive to give you quality time with your provider. You may need to reschedule your appointment if you arrive late (15 or more minutes).  Arriving late affects you and other patients whose appointments are after yours.  Also, if you miss three or more appointments without notifying the office, you may be dismissed from the clinic at the provider's discretion.      For prescription refill requests, have your pharmacy contact our office and allow 72 hours for refills to be completed.    Today you received Retacrit 6,000U injection.    BELOW ARE SYMPTOMS THAT SHOULD BE REPORTED IMMEDIATELY: *FEVER GREATER THAN 100.4 F (38 C) OR HIGHER *CHILLS OR SWEATING *NAUSEA AND VOMITING THAT IS NOT CONTROLLED WITH YOUR NAUSEA MEDICATION *UNUSUAL SHORTNESS OF BREATH *UNUSUAL BRUISING OR BLEEDING *URINARY PROBLEMS (pain or burning when urinating, or frequent urination) *BOWEL PROBLEMS (unusual diarrhea, constipation, pain near the anus) TENDERNESS IN MOUTH AND THROAT WITH OR WITHOUT PRESENCE OF ULCERS (sore throat, sores in mouth, or a toothache) UNUSUAL RASH, SWELLING OR PAIN  UNUSUAL VAGINAL DISCHARGE OR ITCHING   Items with * indicate a potential emergency and should be followed up as soon as possible or go to the Emergency Department if any problems should occur.  Please show the CHEMOTHERAPY ALERT CARD or IMMUNOTHERAPY ALERT CARD at  check-in to the Emergency Department and triage nurse.  Should you have questions after your visit or need to cancel or reschedule your appointment, please contact [facility] [place] - A DEPT OF [facility] [phone]  and follow the prompts.  Office hours are 8:00 a.m. to 4:30 p.m. Monday - Friday. Please note that voicemails left after 4:00 p.m. may not be returned until the following business day.  We are closed weekends and major holidays. You have access to a nurse at all times for urgent questions. Please call the main number to the clinic [phone] and follow the prompts.  For any non-urgent questions, you may also contact your provider using MyChart. We now offer e-Visits for anyone 32 and older to request care online for non-urgent symptoms. For details visit [email or web address].   Also download the MyChart app! Go to the app store, search "MyChart", open the app, select [place], and log in with your MyChart username and password.

## 2024-01-09 NOTE — Progress Notes (Signed)
 Andrew Adams presents today for injection per the provider's orders.  Retacrit 6,000U administration without incident; injection site WNL; see MAR for injection details.  Patient tolerated procedure well and without incident.  No questions or complaints noted at this time. Patient's hemoglobin noted to be 9.4 today.   Discharged from clinic ambulatory in stable condition. Alert and oriented x 3. F/U with [facility] as scheduled.

## 2024-01-15 DIAGNOSIS — E1122 Type 2 diabetes mellitus with diabetic chronic kidney disease: Secondary | ICD-10-CM | POA: Diagnosis not present

## 2024-01-15 DIAGNOSIS — E1129 Type 2 diabetes mellitus with other diabetic kidney complication: Secondary | ICD-10-CM | POA: Diagnosis not present

## 2024-01-15 DIAGNOSIS — N184 Chronic kidney disease, stage 4 (severe): Secondary | ICD-10-CM | POA: Diagnosis not present

## 2024-01-15 DIAGNOSIS — R809 Proteinuria, unspecified: Secondary | ICD-10-CM | POA: Diagnosis not present

## 2024-01-22 NOTE — Progress Notes (Unsigned)
 [facility] [address], [place] [address]   CLINIC:  Medical Oncology/Hematology  PCP:  Lovey Newcomer, PA [address] 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 male returns for routine follow-up of normocytic anemia.  He was last seen by Rojelio Brenner PA-C on 10/17/2023.  At today's visit, he reports feeling fairly well.  No recent hospitalizations, surgeries, or changes in baseline health status.  He does have some elevated blood pressure, with BP today 164/87 and home SBP reportedly 150-160.  BP is managed by his PCP and he is compliant with medications.  Otherwise, He is tolerating Retacrit well without any major side effects.  No symptoms concerning for DVT or PE.  He reports scant rectal bleeding from hemorrhoids (with wiping), but denies any major bleeding episodes.  No fatigue, pica, headaches, chest pain, lightheadedness, or syncope.  He has mild baseline dyspnea on exertion.     He has 25% energy and 75% appetite. He endorses that he is maintaining a stable weight.  ASSESSMENT & PLAN:  1.  Normocytic anemia - Patient seen at the request of Dr. Wolfgang Phoenix for anemia in the setting of CKD stage IV - Combination anemia from CKD stage IV and functional iron deficiency. - On iron supplement daily since 2021.  No prior history of blood transfusion. - Colonoscopy (08/28/2020): Nonbleeding internal hemorrhoids, diverticulosis. - EGD (02/07/2022): Multiple gastric polyps. - SPEP and immunofixation unremarkable.  Kappa and lambda free light chains elevated without elevated kappa lambda light chain ratio (consistent with CKD stage IV).  UPEP was negative. - Previous B12 and folate levels within the normal range. - Retacrit initiated in February 2024.  Currently  receiving Retacrit 6,000 units every 2 weeks. - Most recent IV iron with Venofer in April 2024 - Denies any major bleeding per rectum or melena. - Lab panel (01/09/2024): Hgb 9.4/MCV 87.6.  Creatinine 2.86/GFR 22 (baseline CKD stage IV).  Ferritin 166, iron saturation 23%. - CBC/D today (01/23/2024): Hgb 9.7/MCV 86.0 - PLAN: We will INCREASE dose of Retacrit to 10,000 units every 2 weeks  - No indication for IV iron at this time - RTC 3 months  2.  Social/family history: - Lives with wife at home.  Independent of ADLs and IADLs.  Has been on O2 via nasal cannula since December 2023.  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  Quit smoking 25 years ago. - No family history of severe anemia.  Sister has cancer but type unknown to the patient.  PLAN SUMMARY: >> CBC + Retacrit every 2 weeks >> Labs (CBC/D, ferritin, iron/TIBC, BMP) + injection + OFFICE visit in 3 months     REVIEW OF SYSTEMS:   Review of Systems  Constitutional:  Negative for appetite change, chills, diaphoresis, fatigue, fever and unexpected weight change.  HENT:   Negative for lump/mass and nosebleeds.   Eyes:  Negative for eye problems.  Respiratory:  Positive for shortness of breath (with exertion). Negative for cough and hemoptysis.   Cardiovascular:  Negative for chest pain, leg swelling and palpitations.  Gastrointestinal:  Negative for abdominal pain, blood in stool, constipation, diarrhea, nausea and vomiting.  Genitourinary:  Negative for hematuria.   Skin: Negative.   Neurological:  Negative for dizziness, headaches and light-headedness.  Hematological:  Does not bruise/bleed easily.     PHYSICAL EXAM:  ECOG PERFORMANCE STATUS: 1 - Symptomatic but completely ambulatory  Vitals:   01/23/24 0850  BP: (!) 164/87  Pulse: 70  Resp: 18  Temp: (!) 97.4 F (36.3 C)  SpO2: 98%   Filed Weights   01/23/24 0850  Weight: 218 lb 7.6 oz (99.1 kg)    Physical Exam Constitutional:       Appearance: Normal appearance. He is obese.  Cardiovascular:     Heart sounds: Normal heart sounds.  Pulmonary:     Breath sounds: Normal breath sounds.  Neurological:     General: No focal deficit present.     Mental Status: Mental status is at baseline.  Psychiatric:        Behavior: Behavior normal. Behavior is cooperative.    PAST MEDICAL/SURGICAL HISTORY:  Past Medical History:  Diagnosis Date   Anemia    Borderline diabetic    Chronic kidney disease    Diabetes mellitus without complication (HCC)    Duodenal adenoma    2021   GERD (gastroesophageal reflux disease)    Gout    Gouty arthritis    H/O small bowel obstruction    History of back surgery    Hx of adenomatous colonic polyps    2021   Hyperlipidemia    Hypertension    PE (pulmonary embolism)    Sleep apnea    Past Surgical History:  Procedure Laterality Date   ABDOMINAL SURGERY  2103   Smal bowel obstruction    ABDOMINAL SURGERY  1981   Perforated large intestine   back     Lower back   BIOPSY  08/28/2020   Procedure: BIOPSY;  Surgeon: Dolores Frame, MD;  [facility];  Service: Gastroenterology;;   BIOPSY  02/07/2022   Procedure: BIOPSY;  Surgeon: Lemar Lofty., MD;  Location: [facility];  Service: Gastroenterology;;   CATARACT EXTRACTION W/PHACO Right 01/06/2020   Procedure: CATARACT EXTRACTION PHACO AND INTRAOCULAR LENS PLACEMENT (IOC) (CDE: 6.48);  Surgeon: Fabio Pierce, MD;  Location: [facility];  Service: Ophthalmology;  Laterality: Right;   CATARACT EXTRACTION W/PHACO Left 01/20/2020   Procedure: CATARACT EXTRACTION PHACO AND INTRAOCULAR LENS PLACEMENT (IOC);  Surgeon: Fabio Pierce, MD;  Location: [facility];  Service: Ophthalmology;  Laterality: Left;  CDE: 5.86   COLONOSCOPY WITH PROPOFOL N/A 08/28/2020   Procedure: COLONOSCOPY WITH PROPOFOL;  Surgeon: Dolores Frame, MD;  [facility];  Service: Gastroenterology;  Laterality: N/A;  230    ENDOSCOPIC MUCOSAL RESECTION N/A 02/07/2022   Procedure: ENDOSCOPIC MUCOSAL RESECTION;  Surgeon: Meridee Score Netty Starring., MD;  Location: [facility];  Service: Gastroenterology;  Laterality: N/A;   ESOPHAGOGASTRODUODENOSCOPY N/A 02/07/2022   Procedure: ESOPHAGOGASTRODUODENOSCOPY (EGD);  Surgeon: Lemar Lofty., MD;  Location: [facility];  Service: Gastroenterology;  Laterality: N/A;   ESOPHAGOGASTRODUODENOSCOPY (EGD) WITH PROPOFOL N/A 08/28/2020   Procedure: ESOPHAGOGASTRODUODENOSCOPY (EGD) WITH PROPOFOL;  Surgeon: Dolores Frame, MD;  [facility];  Service: Gastroenterology;  Laterality: N/A;   ESOPHAGOGASTRODUODENOSCOPY (EGD) WITH PROPOFOL N/A 12/14/2020   Procedure: ESOPHAGOGASTRODUODENOSCOPY (EGD) WITH PROPOFOL;  Surgeon: Meridee Score Netty Starring., MD;  Location: [facility];  Service: Gastroenterology;  Laterality: N/A;   GIVENS CAPSULE STUDY N/A 09/10/2020   Procedure: GIVENS CAPSULE STUDY;  Surgeon: Dolores Frame, MD;  [facility];  Service: Gastroenterology;  Laterality: N/A;  730   HEMOSTASIS CLIP PLACEMENT  12/14/2020   Procedure: HEMOSTASIS CLIP PLACEMENT;  Surgeon: Corliss Parish  Montez Hageman., MD;  Location: [facility];  Service: Gastroenterology;;   HEMOSTASIS CLIP PLACEMENT  02/07/2022   Procedure: HEMOSTASIS CLIP PLACEMENT;  Surgeon: Lemar Lofty., MD;  Location: [facility];  Service: Gastroenterology;;   HEMOSTASIS CONTROL  12/14/2020   Procedure: HEMOSTASIS CONTROL;  Surgeon: Lemar Lofty., MD;  Location: [facility];  Service: Gastroenterology;;   HERNIA REPAIR     incisional   HOT HEMOSTASIS N/A 02/07/2022   Procedure: HOT HEMOSTASIS (ARGON PLASMA COAGULATION/BICAP);  Surgeon: Lemar Lofty., MD;  Location: [facility];  Service: Gastroenterology;  Laterality: N/A;   POLYPECTOMY  08/28/2020   Procedure: POLYPECTOMY;  Surgeon: Dolores Frame, MD;  [facility];  Service:  Gastroenterology;;   POLYPECTOMY  12/14/2020   Procedure: POLYPECTOMY;  Surgeon: Lemar Lofty., MD;  Location: [facility];  Service: Gastroenterology;;   Susa Day  12/14/2020   Procedure: Susa Day;  Surgeon: Mansouraty, Netty Starring., MD;  Location: [facility];  Service: Gastroenterology;;   Brooke Dare INJECTION  02/07/2022   Procedure: SUBMUCOSAL LIFTING INJECTION;  Surgeon: Lemar Lofty., MD;  Location: [facility];  Service: Gastroenterology;;   SUBMUCOSAL TATTOO INJECTION  12/14/2020   Procedure: SUBMUCOSAL TATTOO INJECTION;  Surgeon: Lemar Lofty., MD;  Location: [facility];  Service: Gastroenterology;;    SOCIAL HISTORY:  Social History   Socioeconomic History   Marital status: Married    Spouse name: Not on file   Number of children: 1   Years of education: Not on file   Highest education level: Not on file  Occupational History   Occupation: Finisher  Tobacco Use   Smoking status: Former    Current packs/day: 0.00    Average packs/day: 1 pack/day for 38.0 years (38.0 ttl pk-yrs)    Types: Cigarettes    Start date: 02/06/1957    Quit date: 02/07/1995    Years since quitting: 28.9   Smokeless tobacco: Never  Vaping Use   Vaping status: Never Used  Substance and Sexual Activity   Alcohol use: Yes    Alcohol/week: 2.0 standard drinks of alcohol    Types: 2 Shots of liquor per week    Comment: occasional    Drug use: Not Currently    Types: Marijuana   Sexual activity: Yes  Other Topics Concern   Not on file  Social History Narrative   Lives with wife.     Social Drivers of Corporate investment banker Strain: Not on file  Food Insecurity: No Food Insecurity (01/31/2023)   Hunger Vital Sign    Worried About Running Out of Food in the Last Year: Never true    Ran Out of Food in the Last Year: Never true  Transportation Needs: No Transportation Needs (01/31/2023)   PRAPARE - Administrator, Civil Service  (Medical): No    Lack of Transportation (Non-Medical): No  Physical Activity: Not on file  Stress: Not on file  Social Connections: Not on file  Intimate Partner Violence: Not At Risk (01/31/2023)   Humiliation, Afraid, Rape, and Kick questionnaire    Fear of Current or Ex-Partner: No    Emotionally Abused: No    Physically Abused: No    Sexually Abused: No    FAMILY HISTORY:  Family History  Problem Relation Age of Onset   Hypertension Sister    Diabetes Sister    Diabetes Sister    Hypertension Mother    Diabetes Mother    Hypertension Father    Ulcers Father  Colon cancer Neg Hx    Esophageal cancer Neg Hx    Stomach cancer Neg Hx    Inflammatory bowel disease Neg Hx    Liver disease Neg Hx    Pancreatic cancer Neg Hx    Rectal cancer Neg Hx     CURRENT MEDICATIONS:  Outpatient Encounter Medications as of 01/23/2024  Medication Sig Note   albuterol (VENTOLIN HFA) 108 (90 Base) MCG/ACT inhaler Inhale 2 puffs into the lungs every 4 (four) hours as needed.    allopurinol (ZYLOPRIM) 100 MG tablet Take 1 tablet (100 mg total) by mouth daily.    amLODipine (NORVASC) 5 MG tablet Take 10 mg by mouth daily. 11/22/2023: Taking 10mg  once a day    apixaban (ELIQUIS) 5 MG TABS tablet Take 2 tablets (10 mg total) by mouth 2 (two) times daily. On 08/27/22, start 5 mg  (1 tab) two times daily    atorvastatin (LIPITOR) 40 MG tablet Take 1 tablet (40 mg total) by mouth daily.    bisoprolol (ZEBETA) 5 MG tablet Take 1 tablet (5 mg total) by mouth daily.    epoetin alfa (EPOGEN) 3000 UNIT/ML injection Inject into the skin.    epoetin alfa-epbx (RETACRIT) 3000 UNIT/ML injection Inject 3,000 Units into the vein every 14 (fourteen) days.    FEROSUL 325 (65 Fe) MG tablet TAKE ONE TABLET BY MOUTH EVERY MORNING WITH BREAKFAST    furosemide (LASIX) 40 MG tablet Take 1 tablet (40 mg total) by mouth daily. (Patient taking differently: Take 2 tabs (80mg ) by mouth every morning & 1 tab (40mg ) every  evening)    hydrALAZINE (APRESOLINE) 25 MG tablet Take 1 tablet (25 mg total) by mouth every 6 (six) hours. 01/23/2023: His after visit summary from Dr. Wolfgang Phoenix says 2 tabs TID   ipratropium-albuterol (DUONEB) 0.5-2.5 (3) MG/3ML SOLN Take 3 mLs by nebulization every 6 (six) hours as needed.    Multiple Vitamin (MULTIVITAMIN WITH MINERALS) TABS tablet Take 1 tablet by mouth daily.    pantoprazole (PROTONIX) 40 MG tablet Take 1 tablet (40 mg total) by mouth 2 (two) times daily before a meal. (Patient taking differently: Take 40 mg by mouth daily.)    spironolactone (ALDACTONE) 25 MG tablet Take 25 mg by mouth daily.    TRULICITY 0.75 MG/0.5ML SOPN Inject 0.75 mg into the skin every Friday.    [DISCONTINUED] potassium chloride (KLOR-CON) 10 MEQ tablet Take 10 mEq by mouth daily. (Patient not taking: Reported on 01/23/2024)    No facility-administered encounter medications on file as of 01/23/2024.    ALLERGIES:  No Known Allergies  LABORATORY DATA:  I have reviewed the labs as listed.  CBC    Component Value Date/Time   WBC 4.4 01/23/2024 0758   RBC 3.44 (L) 01/23/2024 0758   HGB 9.7 (L) 01/23/2024 0758   HCT 29.6 (L) 01/23/2024 0758   PLT 154 01/23/2024 0758   MCV 86.0 01/23/2024 0758   MCV 84.2 04/12/2013 1501   MCH 28.2 01/23/2024 0758   MCHC 32.8 01/23/2024 0758   RDW 14.0 01/23/2024 0758   LYMPHSABS 1.1 01/09/2024 0859   MONOABS 0.5 01/09/2024 0859   EOSABS 0.2 01/09/2024 0859   BASOSABS 0.0 01/09/2024 0859      Latest Ref Rng & Units 01/09/2024    9:06 AM 01/09/2024    8:59 AM 11/14/2023    8:49 AM  CMP  Glucose 70 - 99 mg/dL 161  096  045   BUN 8 - 23 mg/dL 36  37  37   Creatinine 0.61 - 1.24 mg/dL 0.98  1.19  1.47   Sodium 135 - 145 mmol/L 134  135  138   Potassium 3.5 - 5.1 mmol/L 4.2  4.2  3.4   Chloride 98 - 111 mmol/L 101  101  99   CO2 22 - 32 mmol/L 23  23  29    Calcium 8.9 - 10.3 mg/dL 8.5  8.5  8.0    8.0     DIAGNOSTIC IMAGING:  I have independently  reviewed the relevant imaging and discussed with the patient.   WRAP UP:  All questions were answered. The patient knows to call the clinic with any problems, questions or concerns.  Medical decision making: Low  Time spent on visit: I spent 15 minutes counseling the patient face to face. The total time spent in the appointment was 22 minutes and more than 50% was on counseling.  Carnella Guadalajara, PA-C  01/23/24 9:17 AM

## 2024-01-23 ENCOUNTER — Inpatient Hospital Stay: Payer: Medicare Other

## 2024-01-23 ENCOUNTER — Inpatient Hospital Stay (HOSPITAL_BASED_OUTPATIENT_CLINIC_OR_DEPARTMENT_OTHER): Payer: Medicare Other | Admitting: Physician Assistant

## 2024-01-23 VITALS — BP 164/87 | HR 70 | Temp 97.4°F | Resp 18 | Ht 69.0 in | Wt 218.5 lb

## 2024-01-23 DIAGNOSIS — D509 Iron deficiency anemia, unspecified: Secondary | ICD-10-CM

## 2024-01-23 DIAGNOSIS — E785 Hyperlipidemia, unspecified: Secondary | ICD-10-CM | POA: Diagnosis not present

## 2024-01-23 DIAGNOSIS — D631 Anemia in chronic kidney disease: Secondary | ICD-10-CM

## 2024-01-23 DIAGNOSIS — N184 Chronic kidney disease, stage 4 (severe): Secondary | ICD-10-CM

## 2024-01-23 DIAGNOSIS — Z860101 Personal history of adenomatous and serrated colon polyps: Secondary | ICD-10-CM | POA: Diagnosis not present

## 2024-01-23 DIAGNOSIS — E1122 Type 2 diabetes mellitus with diabetic chronic kidney disease: Secondary | ICD-10-CM | POA: Diagnosis not present

## 2024-01-23 DIAGNOSIS — Z87891 Personal history of nicotine dependence: Secondary | ICD-10-CM | POA: Diagnosis not present

## 2024-01-23 DIAGNOSIS — K317 Polyp of stomach and duodenum: Secondary | ICD-10-CM | POA: Diagnosis not present

## 2024-01-23 DIAGNOSIS — Z79899 Other long term (current) drug therapy: Secondary | ICD-10-CM | POA: Diagnosis not present

## 2024-01-23 DIAGNOSIS — G4730 Sleep apnea, unspecified: Secondary | ICD-10-CM | POA: Diagnosis not present

## 2024-01-23 DIAGNOSIS — M109 Gout, unspecified: Secondary | ICD-10-CM | POA: Diagnosis not present

## 2024-01-23 DIAGNOSIS — Z7901 Long term (current) use of anticoagulants: Secondary | ICD-10-CM | POA: Diagnosis not present

## 2024-01-23 DIAGNOSIS — R0609 Other forms of dyspnea: Secondary | ICD-10-CM | POA: Diagnosis not present

## 2024-01-23 DIAGNOSIS — I129 Hypertensive chronic kidney disease with stage 1 through stage 4 chronic kidney disease, or unspecified chronic kidney disease: Secondary | ICD-10-CM | POA: Diagnosis not present

## 2024-01-23 DIAGNOSIS — Z86711 Personal history of pulmonary embolism: Secondary | ICD-10-CM | POA: Diagnosis not present

## 2024-01-23 DIAGNOSIS — Z7985 Long-term (current) use of injectable non-insulin antidiabetic drugs: Secondary | ICD-10-CM | POA: Diagnosis not present

## 2024-01-23 LAB — CBC
HCT: 29.6 % — ABNORMAL LOW (ref 39.0–52.0)
Hemoglobin: 9.7 g/dL — ABNORMAL LOW (ref 13.0–17.0)
MCH: 28.2 pg (ref 26.0–34.0)
MCHC: 32.8 g/dL (ref 30.0–36.0)
MCV: 86 fL (ref 80.0–100.0)
Platelets: 154 10*3/uL (ref 150–400)
RBC: 3.44 MIL/uL — ABNORMAL LOW (ref 4.22–5.81)
RDW: 14 % (ref 11.5–15.5)
WBC: 4.4 10*3/uL (ref 4.0–10.5)
nRBC: 0 % (ref 0.0–0.2)

## 2024-01-23 MED ORDER — EPOETIN ALFA-EPBX 3000 UNIT/ML IJ SOLN
10000.0000 [IU] | Freq: Once | INTRAMUSCULAR | Status: AC
  Administered 2024-01-23: 10000 [IU] via SUBCUTANEOUS
  Filled 2024-01-23: qty 4

## 2024-01-23 NOTE — Progress Notes (Signed)
 HGB 9.7. Message received from R. Pennington PA to proceed with Retacrit injection. 10,000 units.   Clint Guy presents today for injection per the provider's orders.  Retacrit 10,000 units administration without incident; injection site WNL; see MAR for injection details.  Patient tolerated procedure well and without incident.  No complaints at this time. Discharged from clinic ambulatory in stable condition. Alert and oriented x 3. F/U with [facility] as scheduled.

## 2024-01-23 NOTE — Patient Instructions (Signed)
 [facility] **VISIT SUMMARY & IMPORTANT INSTRUCTIONS **   You were seen today by Rojelio Brenner PA-C for your anemia.   Your anemia is related to your chronic kidney disease. Your blood levels are still slightly lower than where they need to be. We will increase the dose of your Retacrit.  You will still receive these shots every 2 weeks.  FOLLOW-UP APPOINTMENT: Labs and office visit in 3 months  ** Thank you for trusting me with your healthcare!  I strive to provide all of my patients with quality care at each visit.  If you receive a survey for this visit, I would be so grateful to you for taking the time to provide feedback.  Thank you in advance!  ~ Lillyauna Jenkinson                   Dr. Doreatha Massed   &   Rojelio Brenner, PA-C   - - - - - - - - - - - - - - - - - -    Thank you for choosing  Cancer Center at [facility] to provide your oncology and hematology care.  To afford each patient quality time with our provider, please arrive at least 15 minutes before your scheduled appointment time.   If you have a lab appointment with the Cancer Center please come in thru the Main Entrance and check in at the main information desk.  You need to re-schedule your appointment should you arrive 10 or more minutes late.  We strive to give you quality time with our providers, and arriving late affects you and other patients whose appointments are after yours.  Also, if you no show three or more times for appointments you may be dismissed from the clinic at the providers discretion.     Again, thank you for choosing [facility].  Our hope is that these requests will decrease the amount of time that you wait before being seen by our physicians.       _____________________________________________________________  Should you have questions after your visit to [facility], please contact our office at [phone]  and follow the prompts.  Our office hours are 8:00 a.m. and 4:30 p.m. Monday - Friday.  Please note that voicemails left after 4:00 p.m. may not be returned until the following business day.  We are closed weekends and major holidays.  You do have access to a nurse 24-7, just call the main number to the clinic [phone] and do not press any options, hold on the line and a nurse will answer the phone.    For prescription refill requests, have your pharmacy contact our office and allow 72 hours.

## 2024-01-23 NOTE — Patient Instructions (Signed)
 [facility] - A DEPT OF [facility]  Discharge Instructions: Thank you for choosing  Cancer Center to provide your oncology and hematology care.  If you have a lab appointment with the Cancer Center - please note that after April 8th, 2024, all labs will be drawn in the cancer center.  You do not have to check in or register with the main entrance as you have in the past but will complete your check-in in the cancer center.  Wear comfortable clothing and clothing appropriate for easy access to any Portacath or PICC line.   We strive to give you quality time with your provider. You may need to reschedule your appointment if you arrive late (15 or more minutes).  Arriving late affects you and other patients whose appointments are after yours.  Also, if you miss three or more appointments without notifying the office, you may be dismissed from the clinic at the provider's discretion.      For prescription refill requests, have your pharmacy contact our office and allow 72 hours for refills to be completed.    Today you received the following chemotherapy and/or immunotherapy agents Retacrit. Epoetin Alfa Injection What is this medication? EPOETIN ALFA (e POE e tin AL fa) treats low levels of red blood cells (anemia) caused by kidney disease, chemotherapy, or HIV medications. It can also be used in people who are at risk for blood loss during surgery. It works by Systems analyst make more red blood cells, which reduces the need for blood transfusions. This medicine may be used for other purposes; ask your health care provider or pharmacist if you have questions. COMMON BRAND NAME(S): Epogen, Procrit, Retacrit What should I tell my care team before I take this medication? They need to know if you have any of these conditions: Blood clots Cancer Heart disease High blood pressure On dialysis Seizures Stroke An unusual or allergic reaction to epoetin  alfa, albumin, benzyl alcohol, other medications, foods, dyes, or preservatives Pregnant or trying to get pregnant Breast-feeding How should I use this medication? This medication is injected into a vein or under the skin. It is usually given by your care team in a hospital or clinic setting. It may also be given at home. If you get this medication at home, you will be taught how to prepare and give it. Use exactly as directed. Take it as directed on the prescription label at the same time every day. Keep taking it unless your care team tells you to stop. It is important that you put your used needles and syringes in a special sharps container. Do not put them in a trash can. If you do not have a sharps container, call your pharmacist or care team to get one. A special MedGuide will be given to you by the pharmacist with each prescription and refill. Be sure to read this information carefully each time. Talk to your care team about the use of this medication in children. While this medication may be used in children as young as 1 month of age for selected conditions, precautions do apply. Overdosage: If you think you have taken too much of this medicine contact a poison control center or emergency room at once. NOTE: This medicine is only for you. Do not share this medicine with others. What if I miss a dose? If you miss a dose, take it as soon as you can. If it is almost time for  your next dose, take only that dose. Do not take double or extra doses. What may interact with this medication? Darbepoetin alfa Methoxy polyethylene glycol-epoetin beta This list may not describe all possible interactions. Give your health care provider a list of all the medicines, herbs, non-prescription drugs, or dietary supplements you use. Also tell them if you smoke, drink alcohol, or use illegal drugs. Some items may interact with your medicine. What should I watch for while using this medication? Visit your care  team for regular checks on your progress. Check your blood pressure as directed. Know what your blood pressure should be and when to contact your care team. Your condition will be monitored carefully while you are receiving this medication. You may need blood work while taking this medication. What side effects may I notice from receiving this medication? Side effects that you should report to your care team as soon as possible: Allergic reactions--skin rash, itching, hives, swelling of the face, lips, tongue, or throat Blood clot--pain, swelling, or warmth in the leg, shortness of breath, chest pain Heart attack--pain or tightness in the chest, shoulders, arms, or jaw, nausea, shortness of breath, cold or clammy skin, feeling faint or lightheaded Increase in blood pressure Rash, fever, and swollen lymph nodes Redness, blistering, peeling, or loosening of the skin, including inside the mouth Seizures Stroke--sudden numbness or weakness of the face, arm, or leg, trouble speaking, confusion, trouble walking, loss of balance or coordination, dizziness, severe headache, change in vision Side effects that usually do not require medical attention (report to your care team if they continue or are bothersome): Bone, joint, or muscle pain Cough Headache Nausea Pain, redness, or irritation at injection site This list may not describe all possible side effects. Call your doctor for medical advice about side effects. You may report side effects to FDA at 1-800-FDA-1088. Where should I keep my medication? Keep out of the reach of children and pets. Store in a refrigerator. Do not freeze. Do not shake. Protect from light. Keep this medication in the original container until you are ready to take it. See product for storage information. Get rid of any unused medication after the expiration date. To get rid of medications that are no longer needed or have expired: Take the medication to a medication take-back  program. Check with your pharmacy or law enforcement to find a location. If you cannot return the medication, ask your pharmacist or care team how to get rid of the medication safely. NOTE: This sheet is a summary. It may not cover all possible information. If you have questions about this medicine, talk to your doctor, pharmacist, or health care provider.  2024 Elsevier/Gold Standard (2022-02-18 00:00:00)       To help prevent nausea and vomiting after your treatment, we encourage you to take your nausea medication as directed.  BELOW ARE SYMPTOMS THAT SHOULD BE REPORTED IMMEDIATELY: *FEVER GREATER THAN 100.4 F (38 C) OR HIGHER *CHILLS OR SWEATING *NAUSEA AND VOMITING THAT IS NOT CONTROLLED WITH YOUR NAUSEA MEDICATION *UNUSUAL SHORTNESS OF BREATH *UNUSUAL BRUISING OR BLEEDING *URINARY PROBLEMS (pain or burning when urinating, or frequent urination) *BOWEL PROBLEMS (unusual diarrhea, constipation, pain near the anus) TENDERNESS IN MOUTH AND THROAT WITH OR WITHOUT PRESENCE OF ULCERS (sore throat, sores in mouth, or a toothache) UNUSUAL RASH, SWELLING OR PAIN  UNUSUAL VAGINAL DISCHARGE OR ITCHING   Items with * indicate a potential emergency and should be followed up as soon as possible or go to the Emergency Department  if any problems should occur.  Please show the CHEMOTHERAPY ALERT CARD or IMMUNOTHERAPY ALERT CARD at check-in to the Emergency Department and triage nurse.  Should you have questions after your visit or need to cancel or reschedule your appointment, please contact [facility] [place] - A DEPT OF [facility] [phone]  and follow the prompts.  Office hours are 8:00 a.m. to 4:30 p.m. Monday - Friday. Please note that voicemails left after 4:00 p.m. may not be returned until the following business day.  We are closed weekends and major holidays. You have access to a nurse at all times for urgent questions. Please call the main number to the clinic  [phone] and follow the prompts.  For any non-urgent questions, you may also contact your provider using MyChart. We now offer e-Visits for anyone 88 and older to request care online for non-urgent symptoms. For details visit [email or web address].   Also download the MyChart app! Go to the app store, search "MyChart", open the app, select [place], and log in with your MyChart username and password.

## 2024-01-30 ENCOUNTER — Inpatient Hospital Stay: Attending: Hematology

## 2024-01-30 ENCOUNTER — Other Ambulatory Visit: Payer: Self-pay | Admitting: *Deleted

## 2024-01-30 DIAGNOSIS — N184 Chronic kidney disease, stage 4 (severe): Secondary | ICD-10-CM

## 2024-01-30 DIAGNOSIS — I129 Hypertensive chronic kidney disease with stage 1 through stage 4 chronic kidney disease, or unspecified chronic kidney disease: Secondary | ICD-10-CM | POA: Insufficient documentation

## 2024-01-30 DIAGNOSIS — Z79899 Other long term (current) drug therapy: Secondary | ICD-10-CM | POA: Insufficient documentation

## 2024-01-30 DIAGNOSIS — D631 Anemia in chronic kidney disease: Secondary | ICD-10-CM | POA: Insufficient documentation

## 2024-01-30 LAB — CBC
HCT: 30.9 % — ABNORMAL LOW (ref 39.0–52.0)
Hemoglobin: 10.1 g/dL — ABNORMAL LOW (ref 13.0–17.0)
MCH: 28.3 pg (ref 26.0–34.0)
MCHC: 32.7 g/dL (ref 30.0–36.0)
MCV: 86.6 fL (ref 80.0–100.0)
Platelets: 193 10*3/uL (ref 150–400)
RBC: 3.57 MIL/uL — ABNORMAL LOW (ref 4.22–5.81)
RDW: 14.2 % (ref 11.5–15.5)
WBC: 4.6 10*3/uL (ref 4.0–10.5)
nRBC: 0 % (ref 0.0–0.2)

## 2024-01-30 LAB — RENAL FUNCTION PANEL
Albumin: 3.8 g/dL (ref 3.5–5.0)
Anion gap: 10 (ref 5–15)
BUN: 54 mg/dL — ABNORMAL HIGH (ref 8–23)
CO2: 25 mmol/L (ref 22–32)
Calcium: 8.7 mg/dL — ABNORMAL LOW (ref 8.9–10.3)
Chloride: 100 mmol/L (ref 98–111)
Creatinine, Ser: 3.78 mg/dL — ABNORMAL HIGH (ref 0.61–1.24)
GFR, Estimated: 15 mL/min — ABNORMAL LOW (ref >60)
Glucose, Bld: 134 mg/dL — ABNORMAL HIGH (ref 70–99)
Phosphorus: 3.7 mg/dL (ref 2.5–4.6)
Potassium: 4.2 mmol/L (ref 3.5–5.1)
Sodium: 135 mmol/L (ref 135–145)

## 2024-02-01 DIAGNOSIS — E1129 Type 2 diabetes mellitus with other diabetic kidney complication: Secondary | ICD-10-CM | POA: Diagnosis not present

## 2024-02-01 DIAGNOSIS — N179 Acute kidney failure, unspecified: Secondary | ICD-10-CM | POA: Diagnosis not present

## 2024-02-01 DIAGNOSIS — E1122 Type 2 diabetes mellitus with diabetic chronic kidney disease: Secondary | ICD-10-CM | POA: Diagnosis not present

## 2024-02-01 DIAGNOSIS — N184 Chronic kidney disease, stage 4 (severe): Secondary | ICD-10-CM | POA: Diagnosis not present

## 2024-02-02 ENCOUNTER — Other Ambulatory Visit (HOSPITAL_COMMUNITY): Payer: Self-pay | Admitting: Nephrology

## 2024-02-02 DIAGNOSIS — D631 Anemia in chronic kidney disease: Secondary | ICD-10-CM

## 2024-02-02 DIAGNOSIS — E1129 Type 2 diabetes mellitus with other diabetic kidney complication: Secondary | ICD-10-CM

## 2024-02-02 DIAGNOSIS — N1831 Chronic kidney disease, stage 3a: Secondary | ICD-10-CM

## 2024-02-02 DIAGNOSIS — N2581 Secondary hyperparathyroidism of renal origin: Secondary | ICD-10-CM

## 2024-02-02 DIAGNOSIS — E1122 Type 2 diabetes mellitus with diabetic chronic kidney disease: Secondary | ICD-10-CM

## 2024-02-06 ENCOUNTER — Inpatient Hospital Stay

## 2024-02-06 VITALS — BP 135/77 | HR 69 | Temp 97.4°F

## 2024-02-06 DIAGNOSIS — Z79899 Other long term (current) drug therapy: Secondary | ICD-10-CM | POA: Diagnosis not present

## 2024-02-06 DIAGNOSIS — I129 Hypertensive chronic kidney disease with stage 1 through stage 4 chronic kidney disease, or unspecified chronic kidney disease: Secondary | ICD-10-CM | POA: Diagnosis not present

## 2024-02-06 DIAGNOSIS — N184 Chronic kidney disease, stage 4 (severe): Secondary | ICD-10-CM | POA: Diagnosis not present

## 2024-02-06 DIAGNOSIS — D631 Anemia in chronic kidney disease: Secondary | ICD-10-CM | POA: Diagnosis not present

## 2024-02-06 DIAGNOSIS — D509 Iron deficiency anemia, unspecified: Secondary | ICD-10-CM

## 2024-02-06 MED ORDER — EPOETIN ALFA-EPBX 10000 UNIT/ML IJ SOLN
10000.0000 [IU] | Freq: Once | INTRAMUSCULAR | Status: AC
  Administered 2024-02-06: 10000 [IU] via SUBCUTANEOUS
  Filled 2024-02-06: qty 1

## 2024-02-06 NOTE — Progress Notes (Signed)
 Patient's Hgb 10.1 on 01/30/24 and blood pressure stable. patient tolerated Retacrit injection with no complaints voiced.  Site clean and dry with no bruising or swelling noted at site.  See MAR for details.  Band aid applied.  Patient stable during and after injection.  Vss with discharge and left in satisfactory condition with no s/s of distress noted. All follow ups as scheduled.   Delana Manganello [employer]

## 2024-02-08 ENCOUNTER — Ambulatory Visit (HOSPITAL_COMMUNITY)
Admission: RE | Admit: 2024-02-08 | Discharge: 2024-02-08 | Disposition: A | Discharge status: Home / Self Care | Source: Ambulatory Visit | Attending: Nephrology | Admitting: Nephrology

## 2024-02-08 DIAGNOSIS — N2581 Secondary hyperparathyroidism of renal origin: Secondary | ICD-10-CM | POA: Diagnosis not present

## 2024-02-08 DIAGNOSIS — N1831 Chronic kidney disease, stage 3a: Secondary | ICD-10-CM | POA: Insufficient documentation

## 2024-02-08 DIAGNOSIS — R809 Proteinuria, unspecified: Secondary | ICD-10-CM | POA: Diagnosis not present

## 2024-02-08 DIAGNOSIS — E1129 Type 2 diabetes mellitus with other diabetic kidney complication: Secondary | ICD-10-CM | POA: Insufficient documentation

## 2024-02-08 DIAGNOSIS — D631 Anemia in chronic kidney disease: Secondary | ICD-10-CM | POA: Diagnosis not present

## 2024-02-08 DIAGNOSIS — N189 Chronic kidney disease, unspecified: Secondary | ICD-10-CM | POA: Diagnosis not present

## 2024-02-08 DIAGNOSIS — E1122 Type 2 diabetes mellitus with diabetic chronic kidney disease: Secondary | ICD-10-CM | POA: Diagnosis not present

## 2024-02-08 DIAGNOSIS — N281 Cyst of kidney, acquired: Secondary | ICD-10-CM | POA: Diagnosis not present

## 2024-02-20 ENCOUNTER — Inpatient Hospital Stay

## 2024-02-20 ENCOUNTER — Other Ambulatory Visit: Payer: Self-pay | Admitting: *Deleted

## 2024-02-20 VITALS — BP 121/70 | HR 76 | Temp 98.4°F | Resp 18

## 2024-02-20 DIAGNOSIS — Z79899 Other long term (current) drug therapy: Secondary | ICD-10-CM | POA: Diagnosis not present

## 2024-02-20 DIAGNOSIS — N184 Chronic kidney disease, stage 4 (severe): Secondary | ICD-10-CM

## 2024-02-20 DIAGNOSIS — D631 Anemia in chronic kidney disease: Secondary | ICD-10-CM

## 2024-02-20 DIAGNOSIS — D509 Iron deficiency anemia, unspecified: Secondary | ICD-10-CM

## 2024-02-20 DIAGNOSIS — I129 Hypertensive chronic kidney disease with stage 1 through stage 4 chronic kidney disease, or unspecified chronic kidney disease: Secondary | ICD-10-CM | POA: Diagnosis not present

## 2024-02-20 LAB — CBC
HCT: 31.2 % — ABNORMAL LOW (ref 39.0–52.0)
Hemoglobin: 10.5 g/dL — ABNORMAL LOW (ref 13.0–17.0)
MCH: 28 pg (ref 26.0–34.0)
MCHC: 33.7 g/dL (ref 30.0–36.0)
MCV: 83.2 fL (ref 80.0–100.0)
Platelets: 147 10*3/uL — ABNORMAL LOW (ref 150–400)
RBC: 3.75 MIL/uL — ABNORMAL LOW (ref 4.22–5.81)
RDW: 14.1 % (ref 11.5–15.5)
WBC: 4.7 10*3/uL (ref 4.0–10.5)
nRBC: 0 % (ref 0.0–0.2)

## 2024-02-20 MED ORDER — EPOETIN ALFA-EPBX 10000 UNIT/ML IJ SOLN
10000.0000 [IU] | Freq: Once | INTRAMUSCULAR | Status: AC
  Administered 2024-02-20: 10000 [IU] via SUBCUTANEOUS
  Filled 2024-02-20: qty 1

## 2024-02-20 NOTE — Patient Instructions (Signed)

## 2024-02-20 NOTE — Progress Notes (Signed)
 Patient's Hgb 10.5 and blood pressure stable. Patient tolerated injection with no complaints voiced.  Site clean and dry with no bruising or swelling noted at site.  See MAR for details.  Band aid applied.  Patient stable during and after injection.  Vss with discharge and left in satisfactory condition with no s/s of distress noted. All follow ups as scheduled.   Andrew Adams [employer]

## 2024-02-21 ENCOUNTER — Ambulatory Visit (HOSPITAL_BASED_OUTPATIENT_CLINIC_OR_DEPARTMENT_OTHER): Payer: Medicare Other | Attending: Primary Care | Admitting: Internal Medicine

## 2024-02-21 DIAGNOSIS — G4733 Obstructive sleep apnea (adult) (pediatric): Secondary | ICD-10-CM | POA: Diagnosis not present

## 2024-02-21 DIAGNOSIS — G4734 Idiopathic sleep related nonobstructive alveolar hypoventilation: Secondary | ICD-10-CM | POA: Diagnosis not present

## 2024-02-25 DIAGNOSIS — G4733 Obstructive sleep apnea (adult) (pediatric): Secondary | ICD-10-CM | POA: Diagnosis not present

## 2024-02-25 NOTE — Procedures (Signed)
 [facility] [address] [place], [place] [address] Tel: [phone]   Fax: [phone]  Titration Interpretation  Patient Name:  Andrew Adams, Andrew Adams Date:  02/21/2024 Referring Physician:  Eulas Hick, NP  Indications for Polysomnography The patient is a 78 year-old Male who is 5\' 9"  and weighs 215.0 lbs. His BMI equals 31.8.  A full night titration treatment study was performed.  Baseline diagnostic NPSG 05/31/20- AHI 29.4/hr, desaturation to 73%, body weight 216 lbs.  Medication was taken at 2124  Atorvastatin   Hydralazine   Allopurinol    Polysomnogram Data A full night polysomnogram recorded the standard physiologic parameters including EEG, EOG, EMG, EKG, nasal and oral airflow.  Respiratory parameters of chest and abdominal movements were recorded with Respiratory Inductance Plethysmography belts.  Oxygen  saturation was recorded by pulse oximetry.   Sleep Architecture The total recording time of the polysomnogram was 422.0 minutes.  The total sleep time was 281.0 minutes.  The patient spent 2.7% of total sleep time in Stage N1, 73.8% in Stage N2, 0.0% in Stages N3, and 23.5% in REM.  Sleep latency was 13.4 minutes.  REM latency was 65.0 minutes.  Sleep Efficiency was 66.6%.  Wake after Sleep Onset time was 127.5 minutes.  Titration Summary The patient was titrated at pressures ranging from 5* cm/H20 with supplemental oxygen  at - up to 11* cm/H20 with supplemental oxygen  at -.  The last pressure used in the study was 11* cm/H20 with supplemental oxygen  at -.  Respiratory Events The polysomnogram revealed a presence of - obstructive, - central, and - mixed apneas resulting in an Apnea index of - events per hour.  There were 7 hypopneas (>=3% desaturation and/or arousal) resulting in an Apnea\Hypopnea Index (AHI >=3% desaturation and/or arousal) of 1.5 events per hour.  There were 2 hypopneas (>=4% desaturation) resulting in an  Apnea\Hypopnea Index (AHI >=4% desaturation) of 0.4 events per hour.  There were - Respiratory Effort Related Arousals resulting in a RERA index of - events per hour. The Respiratory Disturbance Index is 1.5 events per hour.  The snore index was - events per hour.  Mean oxygen  saturation was 93.0%.  The lowest oxygen  saturation during sleep was 88.0%.  Time spent <=88% oxygen  saturation was 0.6 minutes (0.2%).  Limb Activity There were - limb movements recorded.  Of this total, - were classified as PLMs.  Of the PLMs, - were associated with arousals.  The Limb Movement index was - per hour while the PLM index was - per hour.  Cardiac Summary The average pulse rate was 82.0 bpm.  The minimum pulse rate was 77.0 bpm while the maximum pulse rate was 90.0 bpm.  Cardiac rhythm was normal with PACs, PVCs.  Comment: CPAP titration to 11 cwp, EPR 2, residual AHI (4%) 0/hr, minimum O2 saturation 92%.  Diagnosis: OSA  Recommendations: Suggest autopap 5-15 or fixed CPAP 11. Patient wore a medium [id number] mask with heated humidification.   This study was personally reviewed and electronically signed by: [employer], [facility] Accredited Board Certified in Sleep Medicine Date/Time: 02/25/24   11:57   Titration Report  Patient Name: Andrew Adams, Andrew Adams Date: 02/21/2024  Date of Birth: 1946-06-06 Study Type: CPAP Titration  Age: 44 year MRN #: [id number]  Sex: Male Interpreting Physician: [employer] [id number]  Height: 5\' 9"  Referring Physician: Eulas Hick, NP  Weight: 215.0 lbs Recording Tech: Odella Bending RPSGT RST  BMI: 31.8 Scoring Tech: Odella Bending RPSGT RST  ESS:  9 Neck Size: 16  Mask Type Resmed Airfit F10 Final Pressure: 11  Mask Size: Medium Supplemental O2: N/A   Study Overview  Lights Off: 09:58:12 PM  Count Index  Lights On: 05:00:14 AM Awakenings: 13 2.8  Time in Bed: 422.0 min. Arousals: 33 7.0  Total Sleep Time: 281.0 min. AHI (>=3% Desat and/or Ar.): 7 1.5    Sleep Efficiency: 66.6% AHI (>=4% Desat): 2 0.4   Sleep Latency: 13.4 min. Limb Movements: - -  Wake After Sleep Onset: 127.5 min. Snore: - -  REM Latency from Sleep Onset: 65.0 min. Desaturations: 25 5.3     Minimum SpO2 TST: 88.0%    Sleep Architecture  % of Time in Bed Stages Time (mins) % Sleep Time  Wake 141.5   Stage N1 7.5 2.7%  Stage N2 207.5 73.8%  Stage N3 0.0 0.0%  REM 66.0 23.5%   Arousal Summary   NREM REM Sleep Index  Respiratory Arousals - - - -  PLM Arousals - - - -  Isolated Limb Movement Arousals - - - -  Snore Arousals - - - -  Spontaneous Arousals 25 8 33 7.0  Total 25 8 33 7.0   Limb Movement Summary   Count Index  Isolated Limb Movements - -  Periodic Limb Movements (PLMs) - -  Total Limb Movements - -    Respiratory Summary   By Sleep Stage By Body Position Total   NREM REM Supine Non-Supine   Time (min) 215.0 66.0 1.0 280.0 281.0         Obstructive Apnea - - - - -  Mixed Apnea - - - - -  Central Apnea - - - - -  Total Apneas - - - - -  Total Apnea Index - - - - -         Hypopneas (>=3% Desat and/or Ar.) 3 4 - 7 7  AHI (>=3% Desat and/or Ar.) 0.8 3.6 - 1.5 1.5         Hypopneas (>=4% Desat) 1 1 - 2 2  AHI (>=4% Desat) 0.3 0.9 - 0.4 0.4          RERAs - - - - -  RERA Index - - - - -         RDI 0.8 3.6 - 1.5 1.5     Respiratory Event Durations   Apnea Hypopnea   NREM REM NREM REM  Average (seconds) - - 16.4 18.5  Maximum (seconds) - - 18.7 23.8    Oxygen  Saturation Summary   Wake NREM REM TST TIB  Average SpO2 94.4% 92.1% 93.3% 92.4% 93.0%  Minimum SpO2 89.0% 88.0% 88.0% 88.0% 88.0%  Maximum SpO2 98.0% 96.0% 97.0% 97.0% 98.0%   Oxygen  Saturation Distribution  Range (%) Time in range (min) Time in range (%)  90.0 - 100.0 360.4 89.4%  80.0 - 90.0 42.9 10.6%  70.0 - 80.0 - -  60.0 - 70.0 - -  50.0 - 60.0 - -  0.0 - 50.0 - -  Time Spent <=88% SpO2  Range (%) Time in range (min) Time in range (%)  0.0 - 88.0 0.6  0.2%      Count Index  Desaturations 25 5.3    Cardiac Summary   Wake NREM REM Sleep Total  Average Pulse Rate (BPM) 82.2 82.0 81.7 81.9 82.0  Minimum Pulse Rate (BPM) 77.0 78.0 77.0 77.0 77.0  Maximum Pulse Rate (BPM) 87.0 90.0 85.0 90.0 90.0   Pulse  Rate Distribution:  Range (bpm) Time in range (min) Time in range (%)  0.0 - 40.0 - -  40.0 - 60.0 - -  60.0 - 80.0 26.8 6.6%  80.0 - 100.0 377.4 93.4%  100.0 - 120.0 - -  120.0 - 140.0 - -  140.0 - 200.0 - -   Titration Summary  PAP Device PAP Level O2 Level Time (min) Wake (min) NREM (min) REM (min) Sleep Eff% OA# CA# MA# Hyp# (>=3%) AHI (>=3%) Hyp# (>=4%) AHI (>=%4) RERA RDI SpO2 <=88% (min) Min SpO2 Mean SpO2 Ar. Index  CPAP 5 - 30.0 17.5 12.5 0.0 41.7% - - - - - -  - -  -  0.0 89.0 90.7 -  CPAP 7 - 21.0 0.0 21.0 0.0 100.0% - - - - - -  - -  -  0.0 89.0 90.4 2.9  CPAP 8 - 41.5 3.5 26.5 11.5 91.6% - - - 1 1.6 1  1.6 -  1.6  0.6 88.0 90.5 1.6  CPAP 9 - 15.5 0.0 15.5 0.0 100.0% - - - - - -  - -  -  0.0 91.0 91.7 -  CPAP 10 - 259.5 91.5 139.5 28.5 64.7% - - - 5 1.8 1  0.4 -  1.8  0.0 90.0 93.0 10.0  CPAP 11 - 55.0 29.0 0.0 26.0 47.3% - - - 1 2.3 -  - -  2.3  0.0 92.0 94.4 6.9    Hypnograms                           Technologist Comments  THE 1-YEAR-OLD MALE PATIENT PRESENTED TO THE SLEEP DISORDER CENTER FOR A CPAP TITRATION STUDY WITH A CHIEF COMPLAINT OF OSA. THE PATIENT WAS FITTED WITH A LARGE RESMED AIRFIT F20 FULL FACE MASK FOR PRE-CPAP TRIAL, WHICH WAS TOLERATED WELL. THE PATIENT WAS LATER SWITCHED TO A RESMED AIRFIT F10 SIZE MEDIUM DUE TO MASK LEAK. BEDTIME MEDICATIONS WERE SELF ADMINISTERED AT 2124. THE LEAD PLACEMENT WAS INITIATED, THEN THE STUDY WAS BEGUN. THE CPAP TRIAL WAS INITIATED USING THE ABOVE MASK TYPE ON A PRESSURE OF 5cmH2O AND WAS TITRATED TO 11cmH2O WITH AN EPR OF 2 HEATED HUMIDIFICATION. THE CPAP PRESSURE WAS INCREASED FOR RESPIRATORY EVENTS AND RERAs UNTIL OPTIMAL PRESSURE  WAS ACHIEVED. NO PLMs - PLMAs WERE NOTED. NO SUPPLEMENTAL OXYGEN  WAS ADMINISTERED. TWO RESTROOM VISITS WERE MADE.  QUESTIONABLE CARDIAC ARRHYTHMIAS WERE OBSERVED IN EPOCHS 17, 24, 41, 45, AND THROUGHOUT THE ENTIRE STUDY. NO OBVIOUS PARASOMNIAs WERE OBSERVED. THE PATIENT TOLERATED THE CPAP TRIAL WELL. THE PATIENT WAS NOTED BEING A MOUTH BREATHER. -TECH                           Brettany Sydney Diplomate, Biomedical engineer of Sleep Medicine  ELECTRONICALLY SIGNED ON:  02/25/2024, 11:50 AM [facility] PH: [phone]   FX: [phone] ACCREDITED BY THE AMERICAN ACADEMY OF SLEEP MEDICINE

## 2024-03-05 ENCOUNTER — Inpatient Hospital Stay: Attending: Hematology

## 2024-03-05 ENCOUNTER — Inpatient Hospital Stay

## 2024-03-05 VITALS — BP 135/81 | HR 80 | Temp 98.1°F | Resp 18

## 2024-03-05 DIAGNOSIS — N184 Chronic kidney disease, stage 4 (severe): Secondary | ICD-10-CM | POA: Diagnosis not present

## 2024-03-05 DIAGNOSIS — D509 Iron deficiency anemia, unspecified: Secondary | ICD-10-CM

## 2024-03-05 DIAGNOSIS — Z79899 Other long term (current) drug therapy: Secondary | ICD-10-CM | POA: Diagnosis not present

## 2024-03-05 DIAGNOSIS — I129 Hypertensive chronic kidney disease with stage 1 through stage 4 chronic kidney disease, or unspecified chronic kidney disease: Secondary | ICD-10-CM | POA: Diagnosis not present

## 2024-03-05 DIAGNOSIS — D631 Anemia in chronic kidney disease: Secondary | ICD-10-CM | POA: Diagnosis not present

## 2024-03-05 LAB — CBC
HCT: 30.2 % — ABNORMAL LOW (ref 39.0–52.0)
Hemoglobin: 10.1 g/dL — ABNORMAL LOW (ref 13.0–17.0)
MCH: 28.5 pg (ref 26.0–34.0)
MCHC: 33.4 g/dL (ref 30.0–36.0)
MCV: 85.1 fL (ref 80.0–100.0)
Platelets: 140 10*3/uL — ABNORMAL LOW (ref 150–400)
RBC: 3.55 MIL/uL — ABNORMAL LOW (ref 4.22–5.81)
RDW: 14.1 % (ref 11.5–15.5)
WBC: 4.8 10*3/uL (ref 4.0–10.5)
nRBC: 0 % (ref 0.0–0.2)

## 2024-03-05 LAB — RENAL FUNCTION PANEL
Albumin: 3.8 g/dL (ref 3.5–5.0)
Anion gap: 11 (ref 5–15)
BUN: 86 mg/dL — ABNORMAL HIGH (ref 8–23)
CO2: 20 mmol/L — ABNORMAL LOW (ref 22–32)
Calcium: 8.6 mg/dL — ABNORMAL LOW (ref 8.9–10.3)
Chloride: 100 mmol/L (ref 98–111)
Creatinine, Ser: 3.82 mg/dL — ABNORMAL HIGH (ref 0.61–1.24)
GFR, Estimated: 15 mL/min — ABNORMAL LOW (ref >60)
Glucose, Bld: 113 mg/dL — ABNORMAL HIGH (ref 70–99)
Phosphorus: 4.5 mg/dL (ref 2.5–4.6)
Potassium: 4.5 mmol/L (ref 3.5–5.1)
Sodium: 131 mmol/L — ABNORMAL LOW (ref 135–145)

## 2024-03-05 MED ORDER — EPOETIN ALFA-EPBX 10000 UNIT/ML IJ SOLN
10000.0000 [IU] | Freq: Once | INTRAMUSCULAR | Status: AC
  Administered 2024-03-05: 10000 [IU] via SUBCUTANEOUS
  Filled 2024-03-05: qty 1

## 2024-03-05 NOTE — Patient Instructions (Signed)

## 2024-03-05 NOTE — Progress Notes (Signed)
 Patient's Hgb 10.1 and blood pressure stable. Patient tolerated Retacrit injection with no complaints voiced.  Site clean and dry with no bruising or swelling noted at site.  See MAR for details.  Band aid applied.  Patient stable during and after injection.  Vss with discharge and left in satisfactory condition with no s/s of distress noted. All follow ups as scheduled.   Andrew Adams [employer]

## 2024-03-06 ENCOUNTER — Encounter: Payer: Self-pay | Admitting: Gastroenterology

## 2024-03-06 ENCOUNTER — Ambulatory Visit: Payer: Medicare Other | Admitting: Gastroenterology

## 2024-03-06 VITALS — BP 120/70 | HR 79 | Ht 69.0 in | Wt 207.0 lb

## 2024-03-06 DIAGNOSIS — Z7901 Long term (current) use of anticoagulants: Secondary | ICD-10-CM

## 2024-03-06 DIAGNOSIS — D1330 Benign neoplasm of unspecified part of small intestine: Secondary | ICD-10-CM | POA: Diagnosis not present

## 2024-03-06 DIAGNOSIS — D649 Anemia, unspecified: Secondary | ICD-10-CM

## 2024-03-06 DIAGNOSIS — K3189 Other diseases of stomach and duodenum: Secondary | ICD-10-CM

## 2024-03-06 DIAGNOSIS — Z860101 Personal history of adenomatous and serrated colon polyps: Secondary | ICD-10-CM

## 2024-03-06 MED ORDER — NA SULFATE-K SULFATE-MG SULF 17.5-3.13-1.6 GM/177ML PO SOLN: 1.0000 | ORAL | 0 refills | Status: AC

## 2024-03-06 NOTE — Patient Instructions (Signed)
 We have sent the following medications to your pharmacy for you to pick up at your convenience: Suprep    You will be contaced by our office prior to your procedure for directions on holding your Eliquis .  If you do not hear from our office 1 week prior to your scheduled procedure, please call [phone] to discuss.   _ x _   ORAL DIABETIC MEDICATION INSTRUCTIONS     __  x __ GLP 1 AGONIST Weekly injectables                   (Bydureon Bcise, Ozempic, [place], [place], Zepbound, Mounjaro)    Hold for 7 days prior to the procedure  You have been scheduled for a colonoscopy. Please follow written instructions given to you at your visit today.   If you use inhalers (even only as needed), please bring them with you on the day of your procedure.  DO NOT TAKE 7 DAYS PRIOR TO TEST- Trulicity (dulaglutide) Ozempic, Wegovy (semaglutide) Mounjaro (tirzepatide) Bydureon Bcise (exanatide extended release)  DO NOT TAKE 1 DAY PRIOR TO YOUR TEST Rybelsus (semaglutide) Adlyxin (lixisenatide) Victoza (liraglutide) Byetta (exanatide) ________________________________________________________  Due to recent changes in healthcare laws, you may see the results of your imaging and laboratory studies on MyChart before your provider has had a chance to review them.  We understand that in some cases there may be results that are confusing or concerning to you. Not all laboratory results come back in the same time frame and the provider may be waiting for multiple results in order to interpret others.  Please give us  48 hours in order for your provider to thoroughly review all the results before contacting the office for clarification of your results.   Thank you for choosing me and [facility].  Dr. Brice Campi

## 2024-03-06 NOTE — Progress Notes (Signed)
 GASTROENTEROLOGY OUTPATIENT CLINIC VISIT   Primary Care Provider Jolynn Needy, PA [address] [place] [address] [phone]  Referring Provider Dr. Sammi Crick  Patient Profile: Andrew Adams is a 78 y.o. 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  The patient presents to the [facility] for an evaluation and management of problem(s) noted below:  Problem List 1. Tubulovillous adenoma of small intestine   2. Duodenal nodule   3. Hx of adenomatous colonic polyps   4. Normocytic anemia   5. Chronic anticoagulation    Discussed the use of AI scribe software for clinical note transcription with the patient, who gave verbal consent to proceed.  History of Present Illness Please see prior GI notes for full details of HPI.  Interval History The patient returns for follow-up to discuss consideration of additional endoscopic evaluation after duodenal adenoma resection and finding of a duodenal nodule.  He does not recall any issues following the procedure.  He experiences rare episodes of lower abdominal soreness/cramping at times.  He has relatively well controlled GERD on once daily PPI therapy (rarely 1 time a week will have breakthrough and will require baking soda to be used).  Denies dysphagia or odynophagia issues.  His bowel habits have not changed.  No blood noted in his stools.  He was due for Colon polyp surveillance last year.  He remains on Eliquis .  His kidney function numbers have been increasing over the past few weeks, and he is scheduled to see his kidney doctor by the end of the month for further evaluation.   GI Review of Systems Positive as above Negative for dysphagia, odynophagia, pain, nausea, vomiting, change in bowel habits  Review of  Systems General: Denies fevers/chills/weight loss unintentionally Cardiovascular: Denies chest pain Pulmonary: Denies shortness of breath Gastroenterological: See HPI Genitourinary: Denies darkened urine or hematuria Hematological: Positive for history of easy bruising/bleeding Dermatological: Denies jaundice Psychological: Mood is stable   Medications Current Outpatient Medications  Medication Sig Dispense Refill   albuterol  (VENTOLIN  HFA) 108 (90 Base) MCG/ACT inhaler Inhale 2 puffs into the lungs every 4 (four) hours as needed.     allopurinol  (ZYLOPRIM ) 100 MG tablet Take 1 tablet (100 mg total) by mouth daily. 90 tablet 1   amLODipine  (NORVASC ) 5 MG tablet Take 10 mg by mouth daily.     apixaban  (ELIQUIS ) 5 MG TABS tablet Take 2 tablets (10 mg total) by mouth 2 (two) times daily. On 08/27/22, start 5 mg  (1 tab) two times daily 74 tablet 1   atorvastatin  (LIPITOR) 40 MG tablet Take 1 tablet (40 mg total) by mouth daily. 90 tablet 1   bisoprolol  (ZEBETA ) 5 MG tablet Take 1 tablet (5 mg total) by mouth daily. 90 tablet 3   epoetin  alfa (EPOGEN ) 3000 UNIT/ML injection Inject into the skin.     epoetin  alfa-epbx (RETACRIT ) 3000 UNIT/ML injection Inject 3,000 Units into the vein every 14 (fourteen) days.     FEROSUL 325 (65 Fe) MG tablet TAKE ONE TABLET BY MOUTH EVERY MORNING WITH BREAKFAST 90 tablet 3   furosemide  (LASIX ) 40 MG tablet Take 1 tablet (40 mg total) by mouth daily. (Patient taking differently: Take 2 tabs (80mg ) by mouth every morning & 1 tab (40mg ) every evening) 30 tablet 1   hydrALAZINE  (APRESOLINE ) 25 MG tablet Take  1 tablet (25 mg total) by mouth every 6 (six) hours. 90 tablet 1   ipratropium-albuterol  (DUONEB) 0.5-2.5 (3) MG/3ML SOLN Take 3 mLs by nebulization every 6 (six) hours as needed. 360 mL 5   Multiple Vitamin (MULTIVITAMIN WITH MINERALS) TABS tablet Take 1 tablet by mouth daily.     Na Sulfate-K Sulfate-Mg Sulfate concentrate (SUPREP BOWEL PREP KIT)  17.5-3.13-1.6 GM/177ML SOLN Take 1 kit (354 mLs total) by mouth as directed. For colonoscopy prep 354 mL 0   pantoprazole  (PROTONIX ) 40 MG tablet Take 1 tablet (40 mg total) by mouth 2 (two) times daily before a meal. (Patient taking differently: Take 40 mg by mouth daily.) 60 tablet 12   spironolactone (ALDACTONE) 25 MG tablet Take 25 mg by mouth daily.     TRULICITY 0.75 MG/0.5ML SOPN Inject 0.75 mg into the skin every Friday.     No current facility-administered medications for this visit.    Allergies No Known Allergies  Histories Past Medical History:  Diagnosis Date   Anemia    Borderline diabetic    Chronic kidney disease    Diabetes mellitus without complication (HCC)    Duodenal adenoma    2021   GERD (gastroesophageal reflux disease)    Gout    Gouty arthritis    H/O small bowel obstruction    History of back surgery    Hx of adenomatous colonic polyps    2021   Hyperlipidemia    Hypertension    PE (pulmonary embolism)    Sleep apnea    Past Surgical History:  Procedure Laterality Date   ABDOMINAL SURGERY  2103   Smal bowel obstruction    ABDOMINAL SURGERY  1981   Perforated large intestine   back     Lower back   BIOPSY  08/28/2020   Procedure: BIOPSY;  Surgeon: Urban Garden, MD;  Location: [facility];  Service: Gastroenterology;;   BIOPSY  02/07/2022   Procedure: BIOPSY;  Surgeon: Normie Becton., MD;  Location: [facility];  Service: Gastroenterology;;   CATARACT EXTRACTION W/PHACO Right 01/06/2020   Procedure: CATARACT EXTRACTION PHACO AND INTRAOCULAR LENS PLACEMENT (IOC) (CDE: 6.48);  Surgeon: Tarri Farm, MD;  Location: [facility];  Service: Ophthalmology;  Laterality: Right;   CATARACT EXTRACTION W/PHACO Left 01/20/2020   Procedure: CATARACT EXTRACTION PHACO AND INTRAOCULAR LENS PLACEMENT (IOC);  Surgeon: Tarri Farm, MD;  Location: [facility];  Service: Ophthalmology;  Laterality: Left;  CDE: 5.86   COLONOSCOPY WITH PROPOFOL  N/A  08/28/2020   Procedure: COLONOSCOPY WITH PROPOFOL ;  Surgeon: Urban Garden, MD;  [facility]: [facility];  Service: Gastroenterology;  Laterality: N/A;  230   ENDOSCOPIC MUCOSAL RESECTION N/A 02/07/2022   Procedure: ENDOSCOPIC MUCOSAL RESECTION;  Surgeon: Brice Campi Albino Alu., MD;  Location: [facility];  Service: Gastroenterology;  Laterality: N/A;   ESOPHAGOGASTRODUODENOSCOPY N/A 02/07/2022   Procedure: ESOPHAGOGASTRODUODENOSCOPY (EGD);  Surgeon: Normie Becton., MD;  Location: [facility];  Service: Gastroenterology;  Laterality: N/A;   ESOPHAGOGASTRODUODENOSCOPY (EGD) WITH PROPOFOL  N/A 08/28/2020   Procedure: ESOPHAGOGASTRODUODENOSCOPY (EGD) WITH PROPOFOL ;  Surgeon: Urban Garden, MD;  [facility];  Service: Gastroenterology;  Laterality: N/A;   ESOPHAGOGASTRODUODENOSCOPY (EGD) WITH PROPOFOL  N/A 12/14/2020   Procedure: ESOPHAGOGASTRODUODENOSCOPY (EGD) WITH PROPOFOL ;  Surgeon: Brice Campi Albino Alu., MD;  Location: [facility];  Service: Gastroenterology;  Laterality: N/A;   GIVENS CAPSULE STUDY N/A 09/10/2020   Procedure: GIVENS CAPSULE STUDY;  Surgeon: Urban Garden, MD;  [facility]: [facility];  Service: Gastroenterology;  Laterality: N/A;  730   HEMOSTASIS CLIP PLACEMENT  12/14/2020   Procedure: HEMOSTASIS CLIP PLACEMENT;  Surgeon: Normie Becton., MD;  Location: [facility];  Service: Gastroenterology;;   HEMOSTASIS CLIP PLACEMENT  02/07/2022   Procedure: HEMOSTASIS CLIP PLACEMENT;  Surgeon: Normie Becton., MD;  Location: [facility];  Service: Gastroenterology;;   HEMOSTASIS CONTROL  12/14/2020   Procedure: HEMOSTASIS CONTROL;  Surgeon: Normie Becton., MD;  Location: [facility];  Service: Gastroenterology;;   HERNIA REPAIR     incisional   HOT HEMOSTASIS N/A 02/07/2022   Procedure: HOT HEMOSTASIS (ARGON PLASMA COAGULATION/BICAP);  Surgeon: Normie Becton., MD;  Location: [facility];   Service: Gastroenterology;  Laterality: N/A;   POLYPECTOMY  08/28/2020   Procedure: POLYPECTOMY;  Surgeon: Urban Garden, MD;  [facility]: [facility];  Service: Gastroenterology;;   POLYPECTOMY  12/14/2020   Procedure: POLYPECTOMY;  Surgeon: Normie Becton., MD;  Location: [facility];  Service: Gastroenterology;;   Daryle Eon  12/14/2020   Procedure: Daryle Eon;  Surgeon: Mansouraty, Albino Alu., MD;  Location: [facility];  Service: Gastroenterology;;   SUBMUCOSAL LIFTING INJECTION  02/07/2022   Procedure: SUBMUCOSAL LIFTING INJECTION;  Surgeon: Normie Becton., MD;  Location: [facility];  Service: Gastroenterology;;   SUBMUCOSAL TATTOO INJECTION  12/14/2020   Procedure: SUBMUCOSAL TATTOO INJECTION;  Surgeon: Normie Becton., MD;  Location: [facility];  Service: Gastroenterology;;   Social History   Socioeconomic History   Marital status: Married    Spouse name: Not on file   Number of children: 1   Years of education: Not on file   Highest education level: Not on file  Occupational History   Occupation: Finisher  Tobacco Use   Smoking status: Former    Current packs/day: 0.00    Average packs/day: 1 pack/day for 38.0 years (38.0 ttl pk-yrs)    Types: Cigarettes    Start date: 02/06/1957    Quit date: 02/07/1995    Years since quitting: 29.1   Smokeless tobacco: Never  Vaping Use   Vaping status: Never Used  Substance and Sexual Activity   Alcohol  use: Yes    Alcohol /week: 2.0 standard drinks of alcohol     Types: 2 Shots of liquor per week    Comment: occasional    Drug use: Not Currently    Types: Marijuana   Sexual activity: Yes  Other Topics Concern   Not on file  Social History Narrative   Lives with wife.     Social Drivers of Corporate investment banker Strain: Not on file  Food Insecurity: No Food Insecurity (01/31/2023)   Hunger Vital Sign    Worried About Running Out of Food in the Last Year: Never true    Ran Out  of Food in the Last Year: Never true  Transportation Needs: No Transportation Needs (01/31/2023)   PRAPARE - Administrator, Civil Service (Medical): No    Lack of Transportation (Non-Medical): No  Physical Activity: Not on file  Stress: Not on file  Social Connections: Not on file  Intimate Partner Violence: Not At Risk (01/31/2023)   Humiliation, Afraid, Rape, and Kick questionnaire    Fear of Current or Ex-Partner: No    Emotionally Abused: No    Physically Abused: No    Sexually Abused: No   Family History  Problem Relation Age of Onset   Hypertension Sister    Diabetes Sister    Diabetes Sister    Hypertension Mother  Diabetes Mother    Hypertension Father    Ulcers Father    Colon cancer Neg Hx    Esophageal cancer Neg Hx    Stomach cancer Neg Hx    Inflammatory bowel disease Neg Hx    Liver disease Neg Hx    Pancreatic cancer Neg Hx    Rectal cancer Neg Hx    I have reviewed his medical, social, and family history in detail and updated the electronic medical record as necessary.    PHYSICAL EXAMINATION  BP 120/70   Pulse 79   Ht 5\' 9"  (1.753 m)   Wt 207 lb (93.9 kg)   BMI 30.57 kg/m  Wt Readings from Last 3 Encounters:  03/06/24 207 lb (93.9 kg)  02/21/24 215 lb (97.5 kg)  01/23/24 218 lb 7.6 oz (99.1 kg)  GEN: NAD, appears chronically ill but is non-toxic, accompanied by wife PSYCH: Cooperative, without pressured speech EYE: Conjunctivae pink, sclerae anicteric ENT: MMM CV: Non-tachycardic RESP: No audible wheezing GI: NABS, soft, protuberant abdomen, distended, NT, without rebound or guarding MSK/EXT: Bilateral pedal edema SKIN: No jaundice NEURO:  Alert & Oriented x 3, no focal deficits   REVIEW OF DATA  I reviewed the following data at the time of this encounter:  GI Procedures and Studies  April 2023 EGD - No gross lesions in esophagus. Z-line irregular, 41 cm from the incisors. Non-obstructing Schatzki ring. - 2 cm hiatal hernia. -  A single small angioectasia with no bleeding was found in the gastric fundus. Fulguration to ablate the lesion to prevent bleeding by argon plasma was successful. - Multiple gastric polyps. 2 with stigmata of recent bleeding/oozing were resected and retrieved. Clips (MR conditional) were placed. - Submucosal nodule found in the duodenum bulb. Biopsied to rule out NET. - Duodenal scar with evidence of some polyp recurrence in D2. Piecemeal EMR and then Avulsion performed for resection as noted above. Clips (MR conditional) were placed. - No other gross lesions in the duodenal bulb, in the first portion of the duodenum, in the second portion of the duodenum, in the third portion of the duodenum and in the fourth portion of the duodenum.  Pathology FINAL MICROSCOPIC DIAGNOSIS:  A. DUODENUM POLYP SCAR, EMR:  Fragments of a tubulovillous adenoma.  Negative for high-grade dysplasia and malignancy.  Additional fragments of normal duodenal mucosa are present.  B.  DUODENAL BULB NODULE, BIOPSY:  Nodular hyperplasia of Brunner's glands.  Negative for adenomatous change and malignancy.  C. STOMACH, POLYPECTOMY:  Benign fundic gland gastric polyp with focal erosion of the surface  epithelium.  Negative for dysplasia and malignancy.   Laboratory Studies  Reviewed those in [facility]  Imaging Studies  June 2024 CTAP without contrast IMPRESSION: There is marked dilation of proximal small bowel loops and decompression of distal small bowel loops consistent with high-grade partial small bowel obstruction, possibly caused by ideations. Zone of transition is noted in right lower quadrant. There is no hydronephrosis. Diverticulosis of colon without signs of focal diverticulitis. There is 2 mm calcific density in the margin of the gallbladder which may suggest gallbladder stone adherent to the mucosa are calcified granuloma in the wall of the gallbladder. Aortic arteriosclerosis. Coronary artery disease. Small  hiatal hernia. Renal cysts. Lumbar spondylosis.   ASSESSMENT  Mr. Abonce is a 78 y.o. male with a pmh significant for hypertension, hyperlipidemia, sleep apnea, prior PE (on anticoagulation), gout, chronic renal insufficiency, IDA, COPD (on Home O2), GERD, duodenal TVA (status post EMR 2022 with  recurrence in 2023).  The patient is seen today for evaluation and management of:  1. Tubulovillous adenoma of small intestine   2. Duodenal nodule   3. Hx of adenomatous colonic polyps   4. Normocytic anemia   5. Chronic anticoagulation    The patient is hemodynamically and clinically stable this time.  He is overdue for duodenal polyp surveillance.  At his age, he still feels like he would like to move forward with evaluation to ensure that it is gone.  He has had some increasing kidney dysfunction, but is still active and not on dialysis.  I think it is reasonable, as long as there is not progressive kidney dysfunction occurring, for us  to move forward with repeat endoscopy.  He will need an endoscopic ultrasound to evaluate the duodenal nodule that was also noted but negative on pathology.  He is due for colon polyp surveillance and would like to have that done altogether.  I am willing to perform all of these procedures together.  Will need to hold Eliquis  and we will obtain approval for that.  Trulicity will be held 1 week prior to his planned procedures.  We will ask the patient to follow-up with his nephrologist and ensure that there are no other concerns from his and in regards to performing his procedures as he gets further workup for his chronic renal insufficiency.  The risks of an EUS including intestinal perforation, bleeding, infection, aspiration, and medication effects were discussed as was the possibility it may not give a definitive diagnosis if a biopsy is performed.  When a biopsy of the pancreas is done as part of the EUS, there is an additional risk of pancreatitis at the rate of about  1-2%.  It was explained that procedure related pancreatitis is typically mild, although it can be severe and even life threatening, which is why we do not perform random pancreatic biopsies and only biopsy a lesion/area we feel is concerning enough to warrant the risk.  The risks and benefits of endoscopic evaluation were discussed with the patient; these include but are not limited to the risk of perforation, infection, bleeding, missed lesions, lack of diagnosis, severe illness requiring hospitalization, as well as anesthesia and sedation related illnesses.  The patient and/or family is agreeable to proceed.  All patient questions were answered to the best of my ability, and the patient agrees to the aforementioned plan of action with follow-up as indicated.   PLAN  Proceed with scheduling EGD/EUS with possible EMR follow-up of TVA Proceed with scheduling colonoscopy for colon polyp surveillance Will obtain approval for hold of blood thinner x 2 days prior to procedure Patient to follow-up with nephrology and ensure no issues with moving forward with his progressive chronic renal insufficiency He will follow-up with his primary GI if other issues develop in [place]   Orders Placed This Encounter  Procedures   Procedural/ Surgical Case Request: ULTRASOUND, UPPER GI TRACT, ENDOSCOPIC, COLONOSCOPY, EGD (ESOPHAGOGASTRODUODENOSCOPY)   Ambulatory referral to Gastroenterology    New Prescriptions   NA SULFATE-K SULFATE-MG SULFATE CONCENTRATE (SUPREP BOWEL PREP KIT) 17.5-3.13-1.6 GM/177ML SOLN    Take 1 kit (354 mLs total) by mouth as directed. For colonoscopy prep   Modified Medications   No medications on file    Planned Follow Up No follow-ups on file.   Total Time in Face-to-Face and in Coordination of Care for patient including independent/personal interpretation/review of prior testing, medical history, examination, medication adjustment, communicating results with the patient  directly,  and documentation with the EHR is 30 minutes.   Yong Henle, MD [facility] Advanced [facility]oscopy Office # [phone] With the procedure to cool

## 2024-03-19 ENCOUNTER — Inpatient Hospital Stay

## 2024-03-19 ENCOUNTER — Ambulatory Visit: Attending: Internal Medicine | Admitting: Internal Medicine

## 2024-03-19 ENCOUNTER — Encounter: Payer: Self-pay | Admitting: Internal Medicine

## 2024-03-19 ENCOUNTER — Other Ambulatory Visit: Payer: Self-pay | Admitting: *Deleted

## 2024-03-19 VITALS — BP 126/72 | HR 83 | Ht 69.0 in | Wt 206.4 lb

## 2024-03-19 VITALS — BP 132/81 | HR 81 | Temp 97.9°F | Resp 16

## 2024-03-19 DIAGNOSIS — D509 Iron deficiency anemia, unspecified: Secondary | ICD-10-CM

## 2024-03-19 DIAGNOSIS — N184 Chronic kidney disease, stage 4 (severe): Secondary | ICD-10-CM | POA: Diagnosis not present

## 2024-03-19 DIAGNOSIS — I471 Supraventricular tachycardia, unspecified: Secondary | ICD-10-CM

## 2024-03-19 DIAGNOSIS — Z79899 Other long term (current) drug therapy: Secondary | ICD-10-CM | POA: Diagnosis not present

## 2024-03-19 DIAGNOSIS — I77819 Aortic ectasia, unspecified site: Secondary | ICD-10-CM

## 2024-03-19 DIAGNOSIS — I129 Hypertensive chronic kidney disease with stage 1 through stage 4 chronic kidney disease, or unspecified chronic kidney disease: Secondary | ICD-10-CM | POA: Diagnosis not present

## 2024-03-19 DIAGNOSIS — D631 Anemia in chronic kidney disease: Secondary | ICD-10-CM | POA: Diagnosis not present

## 2024-03-19 LAB — CBC
HCT: 32.2 % — ABNORMAL LOW (ref 39.0–52.0)
Hemoglobin: 10.6 g/dL — ABNORMAL LOW (ref 13.0–17.0)
MCH: 28.2 pg (ref 26.0–34.0)
MCHC: 32.9 g/dL (ref 30.0–36.0)
MCV: 85.6 fL (ref 80.0–100.0)
Platelets: 144 10*3/uL — ABNORMAL LOW (ref 150–400)
RBC: 3.76 MIL/uL — ABNORMAL LOW (ref 4.22–5.81)
RDW: 14.1 % (ref 11.5–15.5)
WBC: 6.3 10*3/uL (ref 4.0–10.5)
nRBC: 0 % (ref 0.0–0.2)

## 2024-03-19 LAB — RENAL FUNCTION PANEL
Albumin: 3.8 g/dL (ref 3.5–5.0)
Anion gap: 11 (ref 5–15)
BUN: 86 mg/dL — ABNORMAL HIGH (ref 8–23)
CO2: 21 mmol/L — ABNORMAL LOW (ref 22–32)
Calcium: 9 mg/dL (ref 8.9–10.3)
Chloride: 97 mmol/L — ABNORMAL LOW (ref 98–111)
Creatinine, Ser: 4.29 mg/dL — ABNORMAL HIGH (ref 0.61–1.24)
GFR, Estimated: 13 mL/min — ABNORMAL LOW (ref >60)
Glucose, Bld: 114 mg/dL — ABNORMAL HIGH (ref 70–99)
Phosphorus: 4.4 mg/dL (ref 2.5–4.6)
Potassium: 4.8 mmol/L (ref 3.5–5.1)
Sodium: 129 mmol/L — ABNORMAL LOW (ref 135–145)

## 2024-03-19 MED ORDER — EPOETIN ALFA-EPBX 10000 UNIT/ML IJ SOLN
10000.0000 [IU] | Freq: Once | INTRAMUSCULAR | Status: AC
  Administered 2024-03-19: 10000 [IU] via SUBCUTANEOUS
  Filled 2024-03-19: qty 1

## 2024-03-19 NOTE — Progress Notes (Signed)
 Cardiology Office Note  Date: 03/19/2024   ID: Andrew Adams, DOB 1946-05-15, MRN [id number]  PCP:  Jolynn Needy, PA  Cardiologist:  Vinetta Brach P Chania Kochanski, MD Electrophysiologist:  Efraim Grange, MD   Reason for Office Visit:    History of Present Illness: Andrew Adams is a 78 y.o. 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  Patient was admitted to [facility] in 09/2022 with acute hypoxic and hypercarbic respiratory failure from COPD exacerbation. During acute hospitalization, he was found to be in paroxysmal SVT (long RP tachycardia, asymptomatic) due to which he was initially started on metoprolol  and diltiazem  (after discontinuing Coreg ). Cardiology was consulted who recommended to start amiodarone  and outpatient EP referral.  He was placed on nasal cannula oxygen  upon discharge. He was seen by EP, amiodarone  was discontinued and event monitor was performed to rule out AT/SVT. He was found to have no SVT episodes but 1 episode of 14 beat nonsustained V. tach.  Currently on bisoprolol  for paroxysmal SVT.  Patient presents today for follow-up visit.   No interval palpitations.  No symptoms overall, no angina, DOE, dizziness, lightheadedness, leg swelling or syncope.  Past Medical History:  Diagnosis Date   Anemia    Borderline diabetic    Chronic kidney disease    Diabetes mellitus without complication (HCC)    Duodenal adenoma    2021   GERD (gastroesophageal reflux disease)    Gout    Gouty arthritis    H/O small bowel obstruction    History of back surgery    Hx of adenomatous colonic polyps    2021   Hyperlipidemia    Hypertension    PE (pulmonary embolism)    Sleep apnea     Past Surgical History:  Procedure Laterality Date   ABDOMINAL SURGERY  2103   Smal bowel obstruction    ABDOMINAL SURGERY  1981   Perforated large intestine    back     Lower back   BIOPSY  08/28/2020   Procedure: BIOPSY;  Surgeon: Urban Garden, MD;  [facility];  Service: Gastroenterology;;   BIOPSY  02/07/2022   Procedure: BIOPSY;  Surgeon: Normie Becton., MD;  Location: [facility];  Service: Gastroenterology;;   CATARACT EXTRACTION W/PHACO Right 01/06/2020   Procedure: CATARACT EXTRACTION PHACO AND INTRAOCULAR LENS PLACEMENT (IOC) (CDE: 6.48);  Surgeon: Tarri Farm, MD;  Location: [facility];  Service: Ophthalmology;  Laterality: Right;   CATARACT EXTRACTION W/PHACO Left 01/20/2020   Procedure: CATARACT EXTRACTION PHACO AND INTRAOCULAR LENS PLACEMENT (IOC);  Surgeon: Tarri Farm, MD;  Location: [facility];  Service: Ophthalmology;  Laterality: Left;  CDE: 5.86   COLONOSCOPY WITH PROPOFOL  N/A 08/28/2020   Procedure: COLONOSCOPY WITH PROPOFOL ;  Surgeon: Urban Garden, MD;  [facility];  Service: Gastroenterology;  Laterality: N/A;  230   ENDOSCOPIC MUCOSAL RESECTION N/A 02/07/2022   Procedure: ENDOSCOPIC MUCOSAL RESECTION;  Surgeon: Brice Campi Albino Alu., MD;  [facility]: [facility];  Service: Gastroenterology;  Laterality: N/A;   ESOPHAGOGASTRODUODENOSCOPY N/A 02/07/2022   Procedure: ESOPHAGOGASTRODUODENOSCOPY (EGD);  Surgeon: Normie Becton., MD;  Location: [facility];  Service: Gastroenterology;  Laterality: N/A;   ESOPHAGOGASTRODUODENOSCOPY (EGD) WITH PROPOFOL  N/A 08/28/2020   Procedure: ESOPHAGOGASTRODUODENOSCOPY (EGD) WITH PROPOFOL ;  Surgeon: Urban Garden, MD;  [facility];  Service: Gastroenterology;  Laterality: N/A;   ESOPHAGOGASTRODUODENOSCOPY (  EGD) WITH PROPOFOL  N/A 12/14/2020   Procedure: ESOPHAGOGASTRODUODENOSCOPY (EGD) WITH PROPOFOL ;  Surgeon: Brice Campi Albino Alu., MD;  [facility];  Service: Gastroenterology;  Laterality: N/A;   GIVENS CAPSULE STUDY N/A 09/10/2020   Procedure: GIVENS CAPSULE STUDY;  Surgeon: Urban Garden,  MD;  [facility];  Service: Gastroenterology;  Laterality: N/A;  730   HEMOSTASIS CLIP PLACEMENT  12/14/2020   Procedure: HEMOSTASIS CLIP PLACEMENT;  Surgeon: Normie Becton., MD;  Location: [facility];  Service: Gastroenterology;;   HEMOSTASIS CLIP PLACEMENT  02/07/2022   Procedure: HEMOSTASIS CLIP PLACEMENT;  Surgeon: Normie Becton., MD;  Location: [facility];  Service: Gastroenterology;;   HEMOSTASIS CONTROL  12/14/2020   Procedure: HEMOSTASIS CONTROL;  Surgeon: Normie Becton., MD;  [facility];  Service: Gastroenterology;;   HERNIA REPAIR     incisional   HOT HEMOSTASIS N/A 02/07/2022   Procedure: HOT HEMOSTASIS (ARGON PLASMA COAGULATION/BICAP);  Surgeon: Normie Becton., MD;  Location: [facility];  Service: Gastroenterology;  Laterality: N/A;   POLYPECTOMY  08/28/2020   Procedure: POLYPECTOMY;  Surgeon: Urban Garden, MD;  [facility];  Service: Gastroenterology;;   POLYPECTOMY  12/14/2020   Procedure: POLYPECTOMY;  Surgeon: Normie Becton., MD;  Location: [facility];  Service: Gastroenterology;;   Daryle Eon  12/14/2020   Procedure: Daryle Eon;  Surgeon: Mansouraty, Albino Alu., MD;  Location: [facility];  Service: Gastroenterology;;   Jona Negro INJECTION  02/07/2022   Procedure: SUBMUCOSAL LIFTING INJECTION;  Surgeon: Normie Becton., MD;  Location: [facility];  Service: Gastroenterology;;   SUBMUCOSAL TATTOO INJECTION  12/14/2020   Procedure: SUBMUCOSAL TATTOO INJECTION;  Surgeon: Normie Becton., MD;  [facility];  Service: Gastroenterology;;    Current Outpatient Medications  Medication Sig Dispense Refill   albuterol  (VENTOLIN  HFA) 108 (90 Base) MCG/ACT inhaler Inhale 2 puffs into the lungs every 4 (four) hours as needed.     allopurinol  (ZYLOPRIM ) 100 MG tablet Take 1 tablet (100 mg total) by mouth daily. 90 tablet 1   amLODipine  (NORVASC ) 5 MG  tablet Take 10 mg by mouth daily.     apixaban  (ELIQUIS ) 5 MG TABS tablet Take 5 mg by mouth 2 (two) times daily.     atorvastatin  (LIPITOR) 40 MG tablet Take 1 tablet (40 mg total) by mouth daily. 90 tablet 1   bisoprolol  (ZEBETA ) 5 MG tablet Take 1 tablet (5 mg total) by mouth daily. 90 tablet 3   epoetin  alfa (EPOGEN ) 3000 UNIT/ML injection Inject into the skin.     epoetin  alfa-epbx (RETACRIT ) 3000 UNIT/ML injection Inject 3,000 Units into the vein every 14 (fourteen) days.     FEROSUL 325 (65 Fe) MG tablet TAKE ONE TABLET BY MOUTH EVERY MORNING WITH BREAKFAST 90 tablet 3   furosemide  (LASIX ) 40 MG tablet Take 40 mg by mouth 2 (two) times daily.     hydrALAZINE  (APRESOLINE ) 25 MG tablet Take 1 tablet (25 mg total) by mouth every 6 (six) hours. 90 tablet 1   ipratropium-albuterol  (DUONEB) 0.5-2.5 (3) MG/3ML SOLN Take 3 mLs by nebulization every 6 (six) hours as needed. 360 mL 5   Multiple Vitamin (MULTIVITAMIN WITH MINERALS) TABS tablet Take 1 tablet by mouth daily.     Na Sulfate-K Sulfate-Mg Sulfate concentrate (SUPREP BOWEL PREP KIT) 17.5-3.13-1.6 GM/177ML SOLN Take 1 kit (354 mLs total) by mouth as directed. For colonoscopy prep 354 mL 0   pantoprazole  (PROTONIX ) 40 MG tablet Take 40 mg by mouth daily.  spironolactone (ALDACTONE) 25 MG tablet Take 25 mg by mouth daily.     TRULICITY 0.75 MG/0.5ML SOPN Inject 0.75 mg into the skin every Friday.     No current facility-administered medications for this visit.   Allergies:  Patient has no known allergies.   Social History: The patient  reports that he quit smoking about 29 years ago. His smoking use included cigarettes. He started smoking about 67 years ago. He has a 38 pack-year smoking history. He has never used smokeless tobacco. He reports current alcohol  use of about 2.0 standard drinks of alcohol  per week. He reports that he does not currently use drugs after having used the following drugs: Marijuana.   Family History: The  patient's family history includes Diabetes in his mother, sister, and sister; Hypertension in his father, mother, and sister; Ulcers in his father.   ROS:  Please see the history of present illness. Otherwise, complete review of systems is positive for none.  All other systems are reviewed and negative.   Physical Exam: VS:  BP 126/72   Pulse 83   Ht 5\' 9"  (1.753 m)   Wt 206 lb 6.4 oz (93.6 kg)   SpO2 97%   BMI 30.48 kg/m , BMI Body mass index is 30.48 kg/m.  Wt Readings from Last 3 Encounters:  03/19/24 206 lb 6.4 oz (93.6 kg)  03/06/24 207 lb (93.9 kg)  02/21/24 215 lb (97.5 kg)    General: Patient appears comfortable at rest. HEENT: Conjunctiva and lids normal, oropharynx clear with moist mucosa. Neck: Supple, no elevated JVP or carotid bruits, no thyromegaly. Lungs: Clear to auscultation, nonlabored breathing at rest. Cardiac: Regular rate and rhythm, no S3 or significant systolic murmur, no pericardial rub. Abdomen: Soft, nontender, no hepatomegaly, bowel sounds present, no guarding or rebound. Extremities: No pitting edema, distal pulses 2+. Skin: Warm and dry. Musculoskeletal: No kyphosis. Neuropsychiatric: Alert and oriented x3, affect grossly appropriate.  ECG:  NSR  Recent Labwork: 10/17/2023: ALT 21; AST 26 03/19/2024: BUN 86; Creatinine, Ser 4.29; Hemoglobin 10.6; Platelets 144; Potassium 4.8; Sodium 129     Component Value Date/Time   CHOL 150 02/24/2014 1030   CHOL 108 05/07/2013 0942   TRIG 130 02/24/2014 1030   TRIG 76 05/07/2013 0942   HDL 45 02/24/2014 1030   HDL 38 (L) 05/07/2013 0942   LDLCALC 79 02/24/2014 1030   LDLCALC 55 05/07/2013 0942    Other Studies Reviewed Today: Echocardiogram in 07/2022 LVEF normal Moderate LVH G2 DD Mild dilatation of the ascending aorta, 38 mm  Long-term event monitor in 10/2022 Sinus rhythm HR 64-104, avg 80 No clinically significant arrhythmia other than one 12 beat run of VT. No patient-triggered  events.  Assessment and Plan:  # Paroxysmal SVT - No interval palpitations. Paroxysmal SVT/long RP tachycardia likely atrial tachycardia occurred in the setting of acute systemic illness.  Off amiodarone , continue bisoprolol  5 mg once daily.  # HTN, controlled - Continue p.o. Lasix  40 mg twice daily, managed by nephrology - Continue spironolactone 25 mg once daily, managed by nephrology - Continue bisoprolol  5 mg once daily - Continue amlodipine  10 mg once daily - HTN management per PCP and nephrology.  # OSA on CPAP # COPD - Follow-up with pulm/sleep medicine.  # Chronic diastolic heart failure - Lasix  management nephrology due to CKD stage IV.  # History of PE -Diagnosed in 07/2022 by NM perfusion scan.  Continue Eliquis  5 mg twice daily.  # Borderline aortic root dilatation, 38  mm (per 2023 Echo) - Will repeat echocardiogram in 1 year before the next clinic visit.    Medication Adjustments/Labs and Tests Ordered: Current medicines are reviewed at length with the patient today.  Concerns regarding medicines are outlined above.   Tests Ordered: Orders Placed This Encounter  Procedures   EKG 12-Lead    Medication Changes: No orders of the defined types were placed in this encounter.   Disposition:  Follow up 1 year  Signed Nissa Stannard Priya Lexington Krotz, MD, 03/19/2024 11:00 AM    [facility] at [facility] [address] [place], [place], [place] [address]

## 2024-03-19 NOTE — Progress Notes (Signed)
 Retacrit injection given per orders. Patient tolerated it well without problems. Vitals stable and discharged home from clinic ambulatory. Follow up as scheduled.

## 2024-03-19 NOTE — Patient Instructions (Addendum)
 Medication Instructions:  Your physician recommends that you continue on your current medications as directed. Please refer to the Current Medication list given to you today.   Labwork: None  Testing/Procedures: Your physician has requested that you have an echocardiogram before one year follow up. Echocardiography is a painless test that uses sound waves to create images of your heart. It provides your doctor with information about the size and shape of your heart and how well your heart's chambers and valves are working. This procedure takes approximately one hour. There are no restrictions for this procedure. Please do NOT wear cologne, perfume, aftershave, or lotions (deodorant is allowed). Please arrive 15 minutes prior to your appointment time.  Please note: We ask at that you not bring children with you during ultrasound (echo/ vascular) testing. Due to room size and safety concerns, children are not allowed in the ultrasound rooms during exams. Our front office staff cannot provide observation of children in our lobby area while testing is being conducted. An adult accompanying a patient to their appointment will only be allowed in the ultrasound room at the discretion of the ultrasound technician under special circumstances. We apologize for any inconvenience.   Follow-Up: Your physician recommends that you schedule a follow-up appointment in: 1 year. You will receive a reminder call in about 8 months reminding you to schedule your appointment. If you don't receive this call, please contact our office.   Any Other Special Instructions Will Be Listed Below (If Applicable). Thank you for choosing  HeartCare!     If you need a refill on your cardiac medications before your next appointment, please call your pharmacy.

## 2024-03-27 DIAGNOSIS — N184 Chronic kidney disease, stage 4 (severe): Secondary | ICD-10-CM | POA: Diagnosis not present

## 2024-03-27 DIAGNOSIS — E1122 Type 2 diabetes mellitus with diabetic chronic kidney disease: Secondary | ICD-10-CM | POA: Diagnosis not present

## 2024-03-27 DIAGNOSIS — N179 Acute kidney failure, unspecified: Secondary | ICD-10-CM | POA: Diagnosis not present

## 2024-03-27 DIAGNOSIS — E1129 Type 2 diabetes mellitus with other diabetic kidney complication: Secondary | ICD-10-CM | POA: Diagnosis not present

## 2024-04-02 ENCOUNTER — Inpatient Hospital Stay: Attending: Hematology

## 2024-04-02 ENCOUNTER — Inpatient Hospital Stay

## 2024-04-02 VITALS — BP 136/92 | HR 78 | Temp 97.1°F | Resp 18

## 2024-04-02 DIAGNOSIS — N184 Chronic kidney disease, stage 4 (severe): Secondary | ICD-10-CM | POA: Diagnosis not present

## 2024-04-02 DIAGNOSIS — D631 Anemia in chronic kidney disease: Secondary | ICD-10-CM | POA: Insufficient documentation

## 2024-04-02 DIAGNOSIS — D509 Iron deficiency anemia, unspecified: Secondary | ICD-10-CM

## 2024-04-02 LAB — CBC
HCT: 30.1 % — ABNORMAL LOW (ref 39.0–52.0)
Hemoglobin: 10.2 g/dL — ABNORMAL LOW (ref 13.0–17.0)
MCH: 29.2 pg (ref 26.0–34.0)
MCHC: 33.9 g/dL (ref 30.0–36.0)
MCV: 86.2 fL (ref 80.0–100.0)
Platelets: 138 10*3/uL — ABNORMAL LOW (ref 150–400)
RBC: 3.49 MIL/uL — ABNORMAL LOW (ref 4.22–5.81)
RDW: 14.9 % (ref 11.5–15.5)
WBC: 5.2 10*3/uL (ref 4.0–10.5)
nRBC: 0 % (ref 0.0–0.2)

## 2024-04-02 MED ORDER — EPOETIN ALFA-EPBX 10000 UNIT/ML IJ SOLN
10000.0000 [IU] | Freq: Once | INTRAMUSCULAR | Status: AC
  Administered 2024-04-02: 10000 [IU] via SUBCUTANEOUS
  Filled 2024-04-02: qty 1

## 2024-04-02 NOTE — Progress Notes (Signed)
Patient presents today for Retacrit injection. Hemoglobin reviewed prior to administration. VSS tolerated without incident or complaint. See MAR for details. Patient stable during and after injection. Patient discharged in satisfactory condition with no s/s of distress noted.  

## 2024-04-02 NOTE — Patient Instructions (Signed)

## 2024-04-16 ENCOUNTER — Ambulatory Visit: Payer: Self-pay | Admitting: Physician Assistant

## 2024-04-16 ENCOUNTER — Inpatient Hospital Stay

## 2024-04-16 VITALS — BP 118/76 | HR 72 | Temp 98.2°F | Resp 16

## 2024-04-16 DIAGNOSIS — N184 Chronic kidney disease, stage 4 (severe): Secondary | ICD-10-CM | POA: Diagnosis not present

## 2024-04-16 DIAGNOSIS — D631 Anemia in chronic kidney disease: Secondary | ICD-10-CM

## 2024-04-16 DIAGNOSIS — D509 Iron deficiency anemia, unspecified: Secondary | ICD-10-CM

## 2024-04-16 LAB — CBC WITH DIFFERENTIAL/PLATELET
Abs Immature Granulocytes: 0.05 10*3/uL (ref 0.00–0.07)
Basophils Absolute: 0 10*3/uL (ref 0.0–0.1)
Basophils Relative: 1 %
Eosinophils Absolute: 0.3 10*3/uL (ref 0.0–0.5)
Eosinophils Relative: 4 %
HCT: 31.4 % — ABNORMAL LOW (ref 39.0–52.0)
Hemoglobin: 10.4 g/dL — ABNORMAL LOW (ref 13.0–17.0)
Immature Granulocytes: 1 %
Lymphocytes Relative: 23 %
Lymphs Abs: 1.3 10*3/uL (ref 0.7–4.0)
MCH: 28.5 pg (ref 26.0–34.0)
MCHC: 33.1 g/dL (ref 30.0–36.0)
MCV: 86 fL (ref 80.0–100.0)
Monocytes Absolute: 0.5 10*3/uL (ref 0.1–1.0)
Monocytes Relative: 9 %
Neutro Abs: 3.7 10*3/uL (ref 1.7–7.7)
Neutrophils Relative %: 62 %
Platelets: 131 10*3/uL — ABNORMAL LOW (ref 150–400)
RBC: 3.65 MIL/uL — ABNORMAL LOW (ref 4.22–5.81)
RDW: 14.8 % (ref 11.5–15.5)
WBC: 5.9 10*3/uL (ref 4.0–10.5)
nRBC: 0 % (ref 0.0–0.2)

## 2024-04-16 LAB — FERRITIN: Ferritin: 362 ng/mL — ABNORMAL HIGH (ref 24–336)

## 2024-04-16 LAB — IRON AND TIBC
Iron: 71 ug/dL (ref 45–182)
Saturation Ratios: 27 % (ref 17.9–39.5)
TIBC: 268 ug/dL (ref 250–450)
UIBC: 197 ug/dL

## 2024-04-16 LAB — BASIC METABOLIC PANEL WITH GFR
Anion gap: 11 (ref 5–15)
BUN: 84 mg/dL — ABNORMAL HIGH (ref 8–23)
CO2: 20 mmol/L — ABNORMAL LOW (ref 22–32)
Calcium: 8.8 mg/dL — ABNORMAL LOW (ref 8.9–10.3)
Chloride: 103 mmol/L (ref 98–111)
Creatinine, Ser: 4.11 mg/dL — ABNORMAL HIGH (ref 0.61–1.24)
GFR, Estimated: 14 mL/min — ABNORMAL LOW (ref >60)
Glucose, Bld: 187 mg/dL — ABNORMAL HIGH (ref 70–99)
Potassium: 4.3 mmol/L (ref 3.5–5.1)
Sodium: 134 mmol/L — ABNORMAL LOW (ref 135–145)

## 2024-04-16 MED ORDER — EPOETIN ALFA-EPBX 10000 UNIT/ML IJ SOLN
10000.0000 [IU] | Freq: Once | INTRAMUSCULAR | Status: AC
  Administered 2024-04-16: 10000 [IU] via SUBCUTANEOUS
  Filled 2024-04-16: qty 1

## 2024-04-16 NOTE — Progress Notes (Signed)
 BP 118/76. HGB 10.4. Andrew Adams presents today for injection per the provider's orders.  Retacrti administration without incident; injection site WNL; see MAR for injection details.  Patient tolerated procedure well and without incident.  No questions or complaints noted at this time.   Discharged from clinic ambulatory in stable condition. Alert and oriented x 3. F/U with [facility] as scheduled.

## 2024-04-16 NOTE — Patient Instructions (Signed)
 [facility] - A DEPT OF [facility]  Discharge Instructions: Thank you for choosing [facility] to provide your oncology and hematology care.  If you have a lab appointment with the Cancer Center - please note that after April 8th, 2024, all labs will be drawn in the cancer center.  You do not have to check in or register with the main entrance as you have in the past but will complete your check-in in the cancer center.  Wear comfortable clothing and clothing appropriate for easy access to any Portacath or PICC line.   We strive to give you quality time with your provider. You may need to reschedule your appointment if you arrive late (15 or more minutes).  Arriving late affects you and other patients whose appointments are after yours.  Also, if you miss three or more appointments without notifying the office, you may be dismissed from the clinic at the provider's discretion.      For prescription refill requests, have your pharmacy contact our office and allow 72 hours for refills to be completed.    Today you received the following chemotherapy and/or immunotherapy agents retacrit. Epoetin Alfa Injection What is this medication? EPOETIN ALFA (e POE e tin AL fa) treats low levels of red blood cells (anemia) caused by kidney disease, chemotherapy, or HIV medications. It can also be used in people who are at risk for blood loss during surgery. It works by Systems analyst make more red blood cells, which reduces the need for blood transfusions. This medicine may be used for other purposes; ask your health care provider or pharmacist if you have questions. COMMON BRAND NAME(S): Epogen, Procrit, Retacrit What should I tell my care team before I take this medication? They need to know if you have any of these conditions: Blood clots Cancer Heart disease High blood pressure On dialysis Seizures Stroke An unusual or allergic reaction to epoetin  alfa, albumin, benzyl alcohol, other medications, foods, dyes, or preservatives Pregnant or trying to get pregnant Breast-feeding How should I use this medication? This medication is injected into a vein or under the skin. It is usually given by your care team in a hospital or clinic setting. It may also be given at home. If you get this medication at home, you will be taught how to prepare and give it. Use exactly as directed. Take it as directed on the prescription label at the same time every day. Keep taking it unless your care team tells you to stop. It is important that you put your used needles and syringes in a special sharps container. Do not put them in a trash can. If you do not have a sharps container, call your pharmacist or care team to get one. A special MedGuide will be given to you by the pharmacist with each prescription and refill. Be sure to read this information carefully each time. Talk to your care team about the use of this medication in children. While this medication may be used in children as young as 1 month of age for selected conditions, precautions do apply. Overdosage: If you think you have taken too much of this medicine contact a poison control center or emergency room at once. NOTE: This medicine is only for you. Do not share this medicine with others. What if I miss a dose? If you miss a dose, take it as soon as you can. If it is almost time for  your next dose, take only that dose. Do not take double or extra doses. What may interact with this medication? Darbepoetin alfa Methoxy polyethylene glycol-epoetin beta This list may not describe all possible interactions. Give your health care provider a list of all the medicines, herbs, non-prescription drugs, or dietary supplements you use. Also tell them if you smoke, drink alcohol, or use illegal drugs. Some items may interact with your medicine. What should I watch for while using this medication? Visit your care  team for regular checks on your progress. Check your blood pressure as directed. Know what your blood pressure should be and when to contact your care team. Your condition will be monitored carefully while you are receiving this medication. You may need blood work while taking this medication. What side effects may I notice from receiving this medication? Side effects that you should report to your care team as soon as possible: Allergic reactions--skin rash, itching, hives, swelling of the face, lips, tongue, or throat Blood clot--pain, swelling, or warmth in the leg, shortness of breath, chest pain Heart attack--pain or tightness in the chest, shoulders, arms, or jaw, nausea, shortness of breath, cold or clammy skin, feeling faint or lightheaded Increase in blood pressure Rash, fever, and swollen lymph nodes Redness, blistering, peeling, or loosening of the skin, including inside the mouth Seizures Stroke--sudden numbness or weakness of the face, arm, or leg, trouble speaking, confusion, trouble walking, loss of balance or coordination, dizziness, severe headache, change in vision Side effects that usually do not require medical attention (report to your care team if they continue or are bothersome): Bone, joint, or muscle pain Cough Headache Nausea Pain, redness, or irritation at injection site This list may not describe all possible side effects. Call your doctor for medical advice about side effects. You may report side effects to FDA at 1-800-FDA-1088. Where should I keep my medication? Keep out of the reach of children and pets. Store in a refrigerator. Do not freeze. Do not shake. Protect from light. Keep this medication in the original container until you are ready to take it. See product for storage information. Get rid of any unused medication after the expiration date. To get rid of medications that are no longer needed or have expired: Take the medication to a medication take-back  program. Check with your pharmacy or law enforcement to find a location. If you cannot return the medication, ask your pharmacist or care team how to get rid of the medication safely. NOTE: This sheet is a summary. It may not cover all possible information. If you have questions about this medicine, talk to your doctor, pharmacist, or health care provider.  2024 Elsevier/Gold Standard (2022-02-18 00:00:00)      To help prevent nausea and vomiting after your treatment, we encourage you to take your nausea medication as directed.  BELOW ARE SYMPTOMS THAT SHOULD BE REPORTED IMMEDIATELY: *FEVER GREATER THAN 100.4 F (38 C) OR HIGHER *CHILLS OR SWEATING *NAUSEA AND VOMITING THAT IS NOT CONTROLLED WITH YOUR NAUSEA MEDICATION *UNUSUAL SHORTNESS OF BREATH *UNUSUAL BRUISING OR BLEEDING *URINARY PROBLEMS (pain or burning when urinating, or frequent urination) *BOWEL PROBLEMS (unusual diarrhea, constipation, pain near the anus) TENDERNESS IN MOUTH AND THROAT WITH OR WITHOUT PRESENCE OF ULCERS (sore throat, sores in mouth, or a toothache) UNUSUAL RASH, SWELLING OR PAIN  UNUSUAL VAGINAL DISCHARGE OR ITCHING   Items with * indicate a potential emergency and should be followed up as soon as possible or go to the Emergency Department if  any problems should occur.  Please show the CHEMOTHERAPY ALERT CARD or IMMUNOTHERAPY ALERT CARD at check-in to the Emergency Department and triage nurse.  Should you have questions after your visit or need to cancel or reschedule your appointment, please contact [facility] [place] - A DEPT OF [facility]igh[facility] [phone]  and follow the prompts.  Office hours are 8:00 a.m. to 4:30 p.m. Monday - Friday. Please note that voicemails left after 4:00 p.m. may not be returned until the following business day.  We are closed weekends and major holidays. You have access to a nurse at all times for urgent questions. Please call the main number to the clinic  [phone] and follow the prompts.  For any non-urgent questions, you may also contact your provider using MyChart. We now offer e-Visits for anyone 49 and older to request care online for non-urgent symptoms. For details visit [email or web address].   Also download the MyChart app! Go to the app store, search "MyChart", open the app, select [place], and log in with your MyChart username and password.

## 2024-04-26 DIAGNOSIS — J449 Chronic obstructive pulmonary disease, unspecified: Secondary | ICD-10-CM | POA: Diagnosis not present

## 2024-04-26 DIAGNOSIS — D649 Anemia, unspecified: Secondary | ICD-10-CM | POA: Diagnosis not present

## 2024-04-26 DIAGNOSIS — I77810 Thoracic aortic ectasia: Secondary | ICD-10-CM | POA: Diagnosis not present

## 2024-04-26 DIAGNOSIS — Z86711 Personal history of pulmonary embolism: Secondary | ICD-10-CM | POA: Diagnosis not present

## 2024-04-29 NOTE — Progress Notes (Unsigned)
 [facility] [address] [place], [place] [address]   CLINIC:  Medical Oncology/Hematology  PCP:  Jolee Elsie RAMAN, PA [address] 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 male returns for routine follow-up of normocytic anemia.  He was last seen by Pleasant Barefoot PA-C on 01/23/2024.  At today's visit, he reports feeling fairly well.  ***No recent hospitalizations, surgeries, or changes in baseline health status.   ***He does have some elevated blood pressure, with BP today 164/87*** and home SBP reportedly 150-160***.  BP is managed by his PCP and he is compliant with medications.***  Otherwise, He is tolerating Retacrit  well without any major side effects.*** ***No symptoms concerning for DVT or PE. ***He reports scant rectal bleeding from hemorrhoids (with wiping), but denies any major bleeding episodes. ***No fatigue, pica, headaches, chest pain, lightheadedness, or syncope. ***He has mild baseline dyspnea on exertion.     He has 25***% energy and 75***% appetite. He endorses that he is maintaining a stable weight.  ASSESSMENT & PLAN:  1.  Normocytic anemia - Patient seen at the request of Dr. Rachele for anemia in the setting of CKD stage IV/V - Combination anemia from CKD stage IV/V and functional iron  deficiency. - On iron  supplement daily since 2021.  No prior history of blood transfusion. - Colonoscopy (08/28/2020): Nonbleeding internal hemorrhoids, diverticulosis. - EGD (02/07/2022): Multiple gastric polyps. - SPEP and immunofixation unremarkable.  Kappa and lambda free light chains elevated without elevated kappa lambda light chain ratio (consistent with CKD stage IV).  UPEP was negative. - Previous B12 and folate levels within the normal range. -  Retacrit  initiated in February 2024.  Currently receiving Retacrit  10,000 units every 2 weeks. - Most recent IV iron  with Venofer  in April 2024 - Denies any major bleeding per rectum or melena.*** - Lab panel (04/16/2024): Hgb 10.4/MCV 86.0.  Creatinine 4.11/GFR 14 (worsening CKD/progression to stage V since April 2025).  Ferritin 362, iron  saturation 27%. **Increasing creatinine was noted, with notification sent to patient's nephrologist, Dr. Rachele, who acknowledged receipt of message. - CBC/D today (***): Hgb ***/MCV *** - PLAN: *** TBD.  *** We will continue Retacrit  10,000 units every 2 weeks  - No indication for IV iron  at this time - RTC 3 months  2.  Social/family history: - Lives with wife at home.  Independent of ADLs and IADLs.  Has been on O2 via nasal cannula since December 2023.  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  Quit smoking 25 years ago. - No family history of severe anemia.  Sister has cancer but type unknown to the patient.  PLAN SUMMARY:*** >> CBC + Retacrit  every 2 weeks >> Labs (CBC/D, ferritin, iron /TIBC, BMP) + injection + OFFICE visit in 3 months     REVIEW OF SYSTEMS:***  Review of Systems  Constitutional:  Negative for appetite change, chills, diaphoresis, fatigue, fever and unexpected weight change.  HENT:   Negative for lump/mass and nosebleeds.   Eyes:  Negative for eye problems.  Respiratory:  Positive for shortness of breath (with exertion). Negative for cough and hemoptysis.   Cardiovascular:  Negative for chest pain, leg swelling and palpitations.  Gastrointestinal:  Negative for abdominal pain, blood in stool, constipation, diarrhea, nausea and vomiting.  Genitourinary:  Negative  for hematuria.   Skin: Negative.   Neurological:  Negative for dizziness, headaches and light-headedness.  Hematological:  Does not bruise/bleed easily.     PHYSICAL EXAM:***  ECOG PERFORMANCE STATUS: 1 - Symptomatic but completely  ambulatory  There were no vitals filed for this visit.  There were no vitals filed for this visit.   Physical Exam Constitutional:      Appearance: Normal appearance. He is obese.   Cardiovascular:     Heart sounds: Normal heart sounds.  Pulmonary:     Breath sounds: Normal breath sounds.   Neurological:     General: No focal deficit present.     Mental Status: Mental status is at baseline.   Psychiatric:        Behavior: Behavior normal. Behavior is cooperative.    PAST MEDICAL/SURGICAL HISTORY:  Past Medical History:  Diagnosis Date   Anemia    Borderline diabetic    Chronic kidney disease    Diabetes mellitus without complication (HCC)    Duodenal adenoma    2021   GERD (gastroesophageal reflux disease)    Gout    Gouty arthritis    H/O small bowel obstruction    History of back surgery    Hx of adenomatous colonic polyps    2021   Hyperlipidemia    Hypertension    PE (pulmonary embolism)    Sleep apnea    Past Surgical History:  Procedure Laterality Date   ABDOMINAL SURGERY  2103   Smal bowel obstruction    ABDOMINAL SURGERY  1981   Perforated large intestine   back     Lower back   BIOPSY  08/28/2020   Procedure: BIOPSY;  Surgeon: Eartha Angelia Sieving, MD;  [facility];  Service: Gastroenterology;;   BIOPSY  02/07/2022   Procedure: BIOPSY;  Surgeon: Wilhelmenia Aloha Raddle., MD;  Location: [facility];  Service: Gastroenterology;;   CATARACT EXTRACTION W/PHACO Right 01/06/2020   Procedure: CATARACT EXTRACTION PHACO AND INTRAOCULAR LENS PLACEMENT (IOC) (CDE: 6.48);  Surgeon: Harrie Agent, MD;  Location: [facility];  Service: Ophthalmology;  Laterality: Right;   CATARACT EXTRACTION W/PHACO Left 01/20/2020   Procedure: CATARACT EXTRACTION PHACO AND INTRAOCULAR LENS PLACEMENT (IOC);  Surgeon: Harrie Agent, MD;  [facility]: [facility];  Service: Ophthalmology;  Laterality: Left;  CDE: 5.86   COLONOSCOPY WITH PROPOFOL  N/A 08/28/2020   Procedure:  COLONOSCOPY WITH PROPOFOL ;  Surgeon: Eartha Angelia Sieving, MD;  [facility];  Service: Gastroenterology;  Laterality: N/A;  230   ENDOSCOPIC MUCOSAL RESECTION N/A 02/07/2022   Procedure: ENDOSCOPIC MUCOSAL RESECTION;  Surgeon: Wilhelmenia Aloha Raddle., MD;  [facility];  Service: Gastroenterology;  Laterality: N/A;   ESOPHAGOGASTRODUODENOSCOPY N/A 02/07/2022   Procedure: ESOPHAGOGASTRODUODENOSCOPY (EGD);  Surgeon: Wilhelmenia Aloha Raddle., MD;  Location: [facility];  Service: Gastroenterology;  Laterality: N/A;   ESOPHAGOGASTRODUODENOSCOPY (EGD) WITH PROPOFOL  N/A 08/28/2020   Procedure: ESOPHAGOGASTRODUODENOSCOPY (EGD) WITH PROPOFOL ;  Surgeon: Eartha Angelia Sieving, MD;  [facility];  Service: Gastroenterology;  Laterality: N/A;   ESOPHAGOGASTRODUODENOSCOPY (EGD) WITH PROPOFOL  N/A 12/14/2020   Procedure: ESOPHAGOGASTRODUODENOSCOPY (EGD) WITH PROPOFOL ;  Surgeon: Wilhelmenia Aloha Raddle., MD;  [facility];  Service: Gastroenterology;  Laterality: N/A;   GIVENS CAPSULE STUDY N/A 09/10/2020   Procedure: GIVENS CAPSULE STUDY;  Surgeon: Eartha Angelia Sieving, MD;  [facility];  Service: Gastroenterology;  Laterality: N/A;  730   HEMOSTASIS CLIP PLACEMENT  12/14/2020   Procedure: HEMOSTASIS CLIP PLACEMENT;  Surgeon: Wilhelmenia Aloha Raddle., MD;  [facility];  Service: Gastroenterology;;   HEMOSTASIS CLIP PLACEMENT  02/07/2022   Procedure: HEMOSTASIS CLIP PLACEMENT;  Surgeon: Wilhelmenia Aloha Raddle., MD;  Location: [facility];  Service: Gastroenterology;;   HEMOSTASIS CONTROL  12/14/2020   Procedure: HEMOSTASIS CONTROL;  Surgeon: Wilhelmenia Aloha Raddle., MD;  [facility];  Service: Gastroenterology;;   HERNIA REPAIR     incisional   HOT HEMOSTASIS N/A 02/07/2022   Procedure: HOT HEMOSTASIS (ARGON PLASMA COAGULATION/BICAP);  Surgeon: Wilhelmenia Aloha Raddle., MD;  Location: [facility];  Service: Gastroenterology;   Laterality: N/A;   POLYPECTOMY  08/28/2020   Procedure: POLYPECTOMY;  Surgeon: Eartha Angelia Sieving, MD;  [facility];  Service: Gastroenterology;;   POLYPECTOMY  12/14/2020   Procedure: POLYPECTOMY;  Surgeon: Wilhelmenia Aloha Raddle., MD;  Location: [facility];  Service: Gastroenterology;;   MATIAS  12/14/2020   Procedure: MATIAS;  Surgeon: Mansouraty, Aloha Raddle., MD;  Location: [facility];  Service: Gastroenterology;;   SUBMUCOSAL LIFTING INJECTION  02/07/2022   Procedure: SUBMUCOSAL LIFTING INJECTION;  Surgeon: Wilhelmenia Aloha Raddle., MD;  Location: [facility];  Service: Gastroenterology;;   SUBMUCOSAL TATTOO INJECTION  12/14/2020   Procedure: SUBMUCOSAL TATTOO INJECTION;  Surgeon: Wilhelmenia Aloha Raddle., MD;  [facility];  Service: Gastroenterology;;    SOCIAL HISTORY:  Social History   Socioeconomic History   Marital status: Married    Spouse name: Not on file   Number of children: 1   Years of education: Not on file   Highest education level: Not on file  Occupational History   Occupation: Finisher  Tobacco Use   Smoking status: Former    Current packs/day: 0.00    Average packs/day: 1 pack/day for 38.0 years (38.0 ttl pk-yrs)    Types: Cigarettes    Start date: 02/06/1957    Quit date: 02/07/1995    Years since quitting: 29.2   Smokeless tobacco: Never  Vaping Use   Vaping status: Never Used  Substance and Sexual Activity   Alcohol  use: Yes    Alcohol /week: 2.0 standard drinks of alcohol     Types: 2 Shots of liquor per week    Comment: occasional    Drug use: Not Currently    Types: Marijuana   Sexual activity: Yes  Other Topics Concern   Not on file  Social History Narrative   Lives with wife.     Social Drivers of Corporate investment banker Strain: Not on file  Food Insecurity: No Food Insecurity (01/31/2023)   Hunger Vital Sign    Worried About Running Out of Food in the Last Year: Never true    Ran Out of Food  in the Last Year: Never true  Transportation Needs: No Transportation Needs (01/31/2023)   PRAPARE - Administrator, Civil Service (Medical): No    Lack of Transportation (Non-Medical): No  Physical Activity: Not on file  Stress: Not on file  Social Connections: Not on file  Intimate Partner Violence: Not At Risk (01/31/2023)   Humiliation, Afraid, Rape, and Kick questionnaire    Fear of Current or Ex-Partner: No    Emotionally Abused: No    Physically Abused: No    Sexually Abused: No    FAMILY HISTORY:  Family History  Problem Relation Age of Onset   Hypertension Sister    Diabetes Sister    Diabetes Sister    Hypertension Mother    Diabetes Mother    Hypertension Father    Ulcers Father    Colon  cancer Neg Hx    Esophageal cancer Neg Hx    Stomach cancer Neg Hx    Inflammatory bowel disease Neg Hx    Liver disease Neg Hx    Pancreatic cancer Neg Hx    Rectal cancer Neg Hx     CURRENT MEDICATIONS:  Outpatient Encounter Medications as of 04/30/2024  Medication Sig Note   albuterol  (VENTOLIN  HFA) 108 (90 Base) MCG/ACT inhaler Inhale 2 puffs into the lungs every 4 (four) hours as needed.    allopurinol  (ZYLOPRIM ) 100 MG tablet Take 1 tablet (100 mg total) by mouth daily.    amLODipine  (NORVASC ) 5 MG tablet Take 10 mg by mouth daily. 11/22/2023: Taking 10mg  once a day    apixaban  (ELIQUIS ) 5 MG TABS tablet Take 5 mg by mouth 2 (two) times daily.    atorvastatin  (LIPITOR) 40 MG tablet Take 1 tablet (40 mg total) by mouth daily.    bisoprolol  (ZEBETA ) 5 MG tablet Take 1 tablet (5 mg total) by mouth daily.    epoetin  alfa (EPOGEN ) 3000 UNIT/ML injection Inject into the skin.    epoetin  alfa-epbx (RETACRIT ) 3000 UNIT/ML injection Inject 3,000 Units into the vein every 14 (fourteen) days.    FEROSUL 325 (65 Fe) MG tablet TAKE ONE TABLET BY MOUTH EVERY MORNING WITH BREAKFAST    furosemide  (LASIX ) 40 MG tablet Take 40 mg by mouth 2 (two) times daily.    hydrALAZINE   (APRESOLINE ) 25 MG tablet Take 1 tablet (25 mg total) by mouth every 6 (six) hours. 01/23/2023: His after visit summary from Dr. Rachele says 2 tabs TID   ipratropium-albuterol  (DUONEB) 0.5-2.5 (3) MG/3ML SOLN Take 3 mLs by nebulization every 6 (six) hours as needed.    Multiple Vitamin (MULTIVITAMIN WITH MINERALS) TABS tablet Take 1 tablet by mouth daily.    Na Sulfate-K Sulfate-Mg Sulfate concentrate (SUPREP BOWEL PREP KIT) 17.5-3.13-1.6 GM/177ML SOLN Take 1 kit (354 mLs total) by mouth as directed. For colonoscopy prep    pantoprazole  (PROTONIX ) 40 MG tablet Take 40 mg by mouth daily.    spironolactone (ALDACTONE) 25 MG tablet Take 25 mg by mouth daily.    TRULICITY 0.75 MG/0.5ML SOPN Inject 0.75 mg into the skin every Friday.    No facility-administered encounter medications on file as of 04/30/2024.    ALLERGIES:  No Known Allergies  LABORATORY DATA:  I have reviewed the labs as listed.  CBC    Component Value Date/Time   WBC 5.9 04/16/2024 0804   RBC 3.65 (L) 04/16/2024 0804   HGB 10.4 (L) 04/16/2024 0804   HCT 31.4 (L) 04/16/2024 0804   PLT 131 (L) 04/16/2024 0804   MCV 86.0 04/16/2024 0804   MCV 84.2 04/12/2013 1501   MCH 28.5 04/16/2024 0804   MCHC 33.1 04/16/2024 0804   RDW 14.8 04/16/2024 0804   LYMPHSABS 1.3 04/16/2024 0804   MONOABS 0.5 04/16/2024 0804   EOSABS 0.3 04/16/2024 0804   BASOSABS 0.0 04/16/2024 0804      Latest Ref Rng & Units 04/16/2024    8:04 AM 03/19/2024    8:26 AM 03/05/2024    8:49 AM  CMP  Glucose 70 - 99 mg/dL 812  885  886   BUN 8 - 23 mg/dL 84  86  86   Creatinine 0.61 - 1.24 mg/dL 5.88  5.70  6.17   Sodium 135 - 145 mmol/L 134  129  131   Potassium 3.5 - 5.1 mmol/L 4.3  4.8  4.5   Chloride 98 -  111 mmol/L 103  97  100   CO2 22 - 32 mmol/L 20  21  20    Calcium  8.9 - 10.3 mg/dL 8.8  9.0  8.6     DIAGNOSTIC IMAGING:  I have independently reviewed the relevant imaging and discussed with the patient.   WRAP UP:  All questions were  answered. The patient knows to call the clinic with any problems, questions or concerns.  Medical decision making: Low  Time spent on visit: I spent 15 minutes counseling the patient face to face. The total time spent in the appointment was 22 minutes and more than 50% was on counseling.  Pleasant CHRISTELLA Barefoot, PA-C  ***

## 2024-04-30 ENCOUNTER — Inpatient Hospital Stay: Attending: Hematology | Admitting: Physician Assistant

## 2024-04-30 ENCOUNTER — Inpatient Hospital Stay

## 2024-04-30 ENCOUNTER — Telehealth: Payer: Self-pay | Admitting: Gastroenterology

## 2024-04-30 VITALS — BP 137/83 | HR 78 | Temp 98.5°F | Resp 19 | Ht 69.0 in | Wt 210.3 lb

## 2024-04-30 DIAGNOSIS — Z7985 Long-term (current) use of injectable non-insulin antidiabetic drugs: Secondary | ICD-10-CM | POA: Diagnosis not present

## 2024-04-30 DIAGNOSIS — D631 Anemia in chronic kidney disease: Secondary | ICD-10-CM

## 2024-04-30 DIAGNOSIS — N184 Chronic kidney disease, stage 4 (severe): Secondary | ICD-10-CM | POA: Diagnosis not present

## 2024-04-30 DIAGNOSIS — E785 Hyperlipidemia, unspecified: Secondary | ICD-10-CM | POA: Diagnosis not present

## 2024-04-30 DIAGNOSIS — M109 Gout, unspecified: Secondary | ICD-10-CM | POA: Diagnosis not present

## 2024-04-30 DIAGNOSIS — Z79899 Other long term (current) drug therapy: Secondary | ICD-10-CM | POA: Diagnosis not present

## 2024-04-30 DIAGNOSIS — K219 Gastro-esophageal reflux disease without esophagitis: Secondary | ICD-10-CM | POA: Insufficient documentation

## 2024-04-30 DIAGNOSIS — E1122 Type 2 diabetes mellitus with diabetic chronic kidney disease: Secondary | ICD-10-CM | POA: Diagnosis not present

## 2024-04-30 DIAGNOSIS — Z87891 Personal history of nicotine dependence: Secondary | ICD-10-CM | POA: Diagnosis not present

## 2024-04-30 DIAGNOSIS — Z7901 Long term (current) use of anticoagulants: Secondary | ICD-10-CM | POA: Insufficient documentation

## 2024-04-30 DIAGNOSIS — G4730 Sleep apnea, unspecified: Secondary | ICD-10-CM | POA: Diagnosis not present

## 2024-04-30 DIAGNOSIS — Z809 Family history of malignant neoplasm, unspecified: Secondary | ICD-10-CM | POA: Insufficient documentation

## 2024-04-30 DIAGNOSIS — D509 Iron deficiency anemia, unspecified: Secondary | ICD-10-CM

## 2024-04-30 DIAGNOSIS — Z86711 Personal history of pulmonary embolism: Secondary | ICD-10-CM | POA: Diagnosis not present

## 2024-04-30 DIAGNOSIS — I129 Hypertensive chronic kidney disease with stage 1 through stage 4 chronic kidney disease, or unspecified chronic kidney disease: Secondary | ICD-10-CM | POA: Diagnosis not present

## 2024-04-30 LAB — CBC
HCT: 27.6 % — ABNORMAL LOW (ref 39.0–52.0)
Hemoglobin: 9.3 g/dL — ABNORMAL LOW (ref 13.0–17.0)
MCH: 29.8 pg (ref 26.0–34.0)
MCHC: 33.7 g/dL (ref 30.0–36.0)
MCV: 88.5 fL (ref 80.0–100.0)
Platelets: 152 10*3/uL (ref 150–400)
RBC: 3.12 MIL/uL — ABNORMAL LOW (ref 4.22–5.81)
RDW: 15.3 % (ref 11.5–15.5)
WBC: 5.5 10*3/uL (ref 4.0–10.5)
nRBC: 0 % (ref 0.0–0.2)

## 2024-04-30 MED ORDER — EPOETIN ALFA-EPBX 10000 UNIT/ML IJ SOLN
10000.0000 [IU] | Freq: Once | INTRAMUSCULAR | Status: AC
  Administered 2024-04-30: 10000 [IU] via SUBCUTANEOUS
  Filled 2024-04-30: qty 1

## 2024-04-30 NOTE — Progress Notes (Signed)
 Retacrit injection given per orders. Patient tolerated it well without problems. Vitals stable and discharged home from clinic ambulatory. Follow up as scheduled.

## 2024-04-30 NOTE — Patient Instructions (Signed)
 [facility] at [facility] **VISIT SUMMARY & IMPORTANT INSTRUCTIONS **   You were seen today by Pleasant Barefoot PA-C for your anemia.   Your anemia is related to your chronic kidney disease. Continue Retacrit  shots every 2 weeks.  FOLLOW-UP APPOINTMENT: Labs and office visit in 3 months  ** Thank you for trusting me with your healthcare!  I strive to provide all of my patients with quality care at each visit.  If you receive a survey for this visit, I would be so grateful to you for taking the time to provide feedback.  Thank you in advance!  ~ Jasminemarie Sherrard                   Dr. Alean Stands   &   Pleasant Barefoot, PA-C   - - - - - - - - - - - - - - - - - -    Thank you for choosing [facility] at [facility] to provide your oncology and hematology care.  To afford each patient quality time with our provider, please arrive at least 15 minutes before your scheduled appointment time.   If you have a lab appointment with the Cancer Center please come in thru the Main Entrance and check in at the main information desk.  You need to re-schedule your appointment should you arrive 10 or more minutes late.  We strive to give you quality time with our providers, and arriving late affects you and other patients whose appointments are after yours.  Also, if you no show three or more times for appointments you may be dismissed from the clinic at the providers discretion.     Again, thank you for choosing [facility].  Our hope is that these requests will decrease the amount of time that you wait before being seen by our physicians.       _____________________________________________________________  Should you have questions after your visit to [facility], please contact our office at [phone] and follow the prompts.  Our office hours are 8:00 a.m. and 4:30 p.m. Monday - Friday.  Please note that voicemails left after  4:00 p.m. may not be returned until the following business day.  We are closed weekends and major holidays.  You do have access to a nurse 24-7, just call the main number to the clinic [phone] and do not press any options, hold on the line and a nurse will answer the phone.    For prescription refill requests, have your pharmacy contact our office and allow 72 hours.

## 2024-04-30 NOTE — Telephone Encounter (Signed)
 Procedure:EUS/Colonoscopy/Endoscopy Procedure date: 05/09/24 Procedure location: [facility] Arrival Time: 9:15 am Spoke with the patient Y/N:   No, I left a detailed message on [phone], 04/30/24 @ 4:40 pm for the patient to return call No, I left a detailed message on [phone], 05/01/24 @ 2:14 pm for the patient to return call No, I left a detailed message on [phone], 05/02/24 @ 2:02 pm for the patient to return call. Called [facility] [phone], pt's contact and left a message for pt to call back.  Any prep concerns? No  Has the patient obtained the prep from the pharmacy ? Yes Do you have a care partner and transportation: Yes Any additional concerns? No

## 2024-05-01 ENCOUNTER — Encounter (HOSPITAL_COMMUNITY): Payer: Self-pay | Admitting: Gastroenterology

## 2024-05-01 NOTE — Progress Notes (Signed)
 Attempted to obtain medical history for pre op call via telephone, unable to reach at this time. HIPAA compliant voicemail message left requesting return call to pre surgical testing department.

## 2024-05-02 ENCOUNTER — Telehealth: Payer: Self-pay | Admitting: Primary Care

## 2024-05-02 ENCOUNTER — Telehealth: Payer: Self-pay

## 2024-05-02 NOTE — Telephone Encounter (Addendum)
 Request was faxed 03/11/24, asking for clearance. Upon reviewing chart for updates, I seen note stating that Pulmonology does not manage. I have sent urgent request to Cardiology.

## 2024-05-02 NOTE — Telephone Encounter (Signed)
 Request for surgical clearance:     Endoscopy Procedure  What type of surgery is being performed?     EGD/EUS/Colon    When is this surgery scheduled?     05/09/24  What type of clearance is required ?   Pharmacy  Are there any medications that need to be held prior to surgery and how long? Eliquis  x2 days   Practice name and name of physician performing surgery?      [facility]  What is your office phone and fax number?      Phone- [phone]  Fax- [phone]  Anesthesia type (None, local, MAC, general) ?       MAC  Please route your response to Blondie Barks, CMA

## 2024-05-02 NOTE — Telephone Encounter (Signed)
 Beth, do you have the form? I can call and update the sender. Thanks!

## 2024-05-02 NOTE — Telephone Encounter (Signed)
 Received pre-op clearance asking if patient could come of Eliquis , our office does not managed. I see him for OSA and lung nodules, due for 6 month follow-up

## 2024-05-06 NOTE — Telephone Encounter (Signed)
   Patient Name: Andrew Adams  DOB: 02-27-1946 MRN: [id number]  Primary Cardiologist: Vishnu P Mallipeddi, MD  Chart reviewed as part of pre-operative protocol coverage.   Patient on Eliquis  for PE in 2023. Eliquis  is prescribed by another provider. Will defer to prescribing MD.   I will route this recommendation to the requesting party via Epic fax function and remove from pre-op pool.  Please call with questions.  Damien JAYSON Braver, NP 05/06/2024, 8:14 AM

## 2024-05-06 NOTE — Telephone Encounter (Addendum)
 Request will be routed back to [facility], which is where I originally sent request. I have spoke with patient again and he states that [facility] does manage his Eliquis . Patient advised that if we do not get clearance by tomorrow, his procedure will be cancelled.

## 2024-05-06 NOTE — Telephone Encounter (Signed)
 Cardiology states that patient is on Eliquis  for PE and not managed by their office either. Called patient and he states that his Eliquis  is managed by [facility]. Request (clearance letter) was faxed to office on 03/11/24. Patient's procedure is 05/09/24. Patient has been advised if we do not get clearance by tomorrow then his procedure will be canceled.

## 2024-05-06 NOTE — Telephone Encounter (Signed)
 Patient on Eliquis  for PE in 2023. Eliquis  is prescribed by another provider. Will defer to prescribing MD.

## 2024-05-07 ENCOUNTER — Telehealth: Payer: Self-pay | Admitting: Internal Medicine

## 2024-05-07 NOTE — Telephone Encounter (Signed)
 Spoke with Andrew Adams from [facility]- she informed me that they are not prescribing or managing Eliquis . The last provider that filled Eliquis  was Dr. Mallipeddi. I have called Cardiology and left message with this information. SW Jasmin in Cardiology and she states that she will send clearance back to Pre-op Team.

## 2024-05-07 NOTE — Telephone Encounter (Signed)
   Pre-operative Risk Assessment    Patient Name: Andrew Adams  DOB: December 27, 1945 MRN: [id number]   Date of last office visit: 03/19/24 Date of next office visit: Not yet scheduled   Request for Surgical Clearance    Procedure:  EUS  Date of Surgery:  Clearance 05/09/24                                Surgeon:  Dr. Brodie Surgeon's Group or Practice Name:  [facility] Phone number:  [phone]  Fax number:  [phone]   Type of Clearance Requested:   - Medical  - Pharmacy:  Hold Apixaban  (Eliquis )     Type of Anesthesia:  MAC   Additional requests/questions:  Caller Norma) stated patient had not taken his Eliquis  today (7/8).   Signed, Jasmin B Wilson   05/07/2024, 4:23 PM

## 2024-05-08 NOTE — Telephone Encounter (Signed)
 Primary care has been managing his Eliquis  prescriptions, clearance should come from Dr. Alm Tat

## 2024-05-08 NOTE — Telephone Encounter (Signed)
 Dr Alm Tat is the hospitalist. I have called [facility] in [place]. Left message for nurse to return my call.

## 2024-05-08 NOTE — Telephone Encounter (Addendum)
 Per Cardiology-   Hold Eliquis  for 2 days prior to procedure and resume when safe from bleeding standpoint.  Patient's last dose was 05/07/24- per patient and his wife.

## 2024-05-08 NOTE — Telephone Encounter (Signed)
   Patient Name: Andrew Adams  DOB: Dec 25, 1945 MRN: [id number]  Primary Cardiologist: Vishnu P Mallipeddi, MD  Chart reviewed as part of pre-operative protocol coverage. Given past medical history and time since last visit, based on ACC/AHA guidelines, Andrew Adams is at acceptable risk for the planned procedure without further cardiovascular testing.   Per Dr. Mallipeddi 05/08/2024 Low risk.   Hold Eliquis  for 2 days prior to procedure and resume when safe from bleeding standpoint.  The patient was advised that if he develops new symptoms prior to surgery to contact our office to arrange for a follow-up visit, and he verbalized understanding.  I will route this recommendation to the requesting party via Epic fax function and remove from pre-op pool.  Please call with questions.  Lamarr Satterfield, NP 05/08/2024, 1:20 PM

## 2024-05-09 ENCOUNTER — Ambulatory Visit (HOSPITAL_COMMUNITY): Admitting: Anesthesiology

## 2024-05-09 ENCOUNTER — Other Ambulatory Visit: Payer: Self-pay

## 2024-05-09 ENCOUNTER — Encounter (HOSPITAL_COMMUNITY)
Admission: RE | Disposition: A | Discharge status: Home / Self Care | Payer: Self-pay | Source: Home / Self Care | Attending: Gastroenterology

## 2024-05-09 ENCOUNTER — Ambulatory Visit (HOSPITAL_COMMUNITY)
Admission: RE | Admit: 2024-05-09 | Discharge: 2024-05-09 | Disposition: A | Discharge status: Home / Self Care | Attending: Gastroenterology | Admitting: Gastroenterology

## 2024-05-09 ENCOUNTER — Encounter (HOSPITAL_COMMUNITY): Payer: Self-pay | Admitting: Gastroenterology

## 2024-05-09 ENCOUNTER — Telehealth: Payer: Self-pay

## 2024-05-09 DIAGNOSIS — Z7901 Long term (current) use of anticoagulants: Secondary | ICD-10-CM | POA: Diagnosis not present

## 2024-05-09 DIAGNOSIS — I110 Hypertensive heart disease with heart failure: Secondary | ICD-10-CM

## 2024-05-09 DIAGNOSIS — E1122 Type 2 diabetes mellitus with diabetic chronic kidney disease: Secondary | ICD-10-CM | POA: Insufficient documentation

## 2024-05-09 DIAGNOSIS — K449 Diaphragmatic hernia without obstruction or gangrene: Secondary | ICD-10-CM | POA: Insufficient documentation

## 2024-05-09 DIAGNOSIS — Z7985 Long-term (current) use of injectable non-insulin antidiabetic drugs: Secondary | ICD-10-CM | POA: Insufficient documentation

## 2024-05-09 DIAGNOSIS — I5033 Acute on chronic diastolic (congestive) heart failure: Secondary | ICD-10-CM

## 2024-05-09 DIAGNOSIS — I130 Hypertensive heart and chronic kidney disease with heart failure and stage 1 through stage 4 chronic kidney disease, or unspecified chronic kidney disease: Secondary | ICD-10-CM | POA: Insufficient documentation

## 2024-05-09 DIAGNOSIS — K2289 Other specified disease of esophagus: Secondary | ICD-10-CM | POA: Diagnosis not present

## 2024-05-09 DIAGNOSIS — K2950 Unspecified chronic gastritis without bleeding: Secondary | ICD-10-CM

## 2024-05-09 DIAGNOSIS — D123 Benign neoplasm of transverse colon: Secondary | ICD-10-CM | POA: Insufficient documentation

## 2024-05-09 DIAGNOSIS — Z87891 Personal history of nicotine dependence: Secondary | ICD-10-CM | POA: Diagnosis not present

## 2024-05-09 DIAGNOSIS — Z79899 Other long term (current) drug therapy: Secondary | ICD-10-CM | POA: Insufficient documentation

## 2024-05-09 DIAGNOSIS — K31A0 Gastric intestinal metaplasia, unspecified: Secondary | ICD-10-CM | POA: Insufficient documentation

## 2024-05-09 DIAGNOSIS — K219 Gastro-esophageal reflux disease without esophagitis: Secondary | ICD-10-CM | POA: Diagnosis not present

## 2024-05-09 DIAGNOSIS — N189 Chronic kidney disease, unspecified: Secondary | ICD-10-CM | POA: Diagnosis not present

## 2024-05-09 DIAGNOSIS — J449 Chronic obstructive pulmonary disease, unspecified: Secondary | ICD-10-CM | POA: Diagnosis not present

## 2024-05-09 DIAGNOSIS — Z7984 Long term (current) use of oral hypoglycemic drugs: Secondary | ICD-10-CM | POA: Insufficient documentation

## 2024-05-09 DIAGNOSIS — G4730 Sleep apnea, unspecified: Secondary | ICD-10-CM | POA: Diagnosis not present

## 2024-05-09 DIAGNOSIS — D649 Anemia, unspecified: Secondary | ICD-10-CM

## 2024-05-09 DIAGNOSIS — K3189 Other diseases of stomach and duodenum: Secondary | ICD-10-CM | POA: Diagnosis not present

## 2024-05-09 DIAGNOSIS — Z1211 Encounter for screening for malignant neoplasm of colon: Secondary | ICD-10-CM

## 2024-05-09 DIAGNOSIS — D132 Benign neoplasm of duodenum: Secondary | ICD-10-CM | POA: Diagnosis not present

## 2024-05-09 DIAGNOSIS — K317 Polyp of stomach and duodenum: Secondary | ICD-10-CM | POA: Diagnosis not present

## 2024-05-09 DIAGNOSIS — I509 Heart failure, unspecified: Secondary | ICD-10-CM | POA: Insufficient documentation

## 2024-05-09 DIAGNOSIS — D122 Benign neoplasm of ascending colon: Secondary | ICD-10-CM

## 2024-05-09 DIAGNOSIS — N184 Chronic kidney disease, stage 4 (severe): Secondary | ICD-10-CM | POA: Diagnosis not present

## 2024-05-09 DIAGNOSIS — K5730 Diverticulosis of large intestine without perforation or abscess without bleeding: Secondary | ICD-10-CM | POA: Insufficient documentation

## 2024-05-09 DIAGNOSIS — D120 Benign neoplasm of cecum: Secondary | ICD-10-CM | POA: Diagnosis not present

## 2024-05-09 DIAGNOSIS — D1330 Benign neoplasm of unspecified part of small intestine: Secondary | ICD-10-CM

## 2024-05-09 DIAGNOSIS — K222 Esophageal obstruction: Secondary | ICD-10-CM | POA: Diagnosis not present

## 2024-05-09 DIAGNOSIS — K31A19 Gastric intestinal metaplasia without dysplasia, unspecified site: Secondary | ICD-10-CM

## 2024-05-09 DIAGNOSIS — D631 Anemia in chronic kidney disease: Secondary | ICD-10-CM | POA: Diagnosis not present

## 2024-05-09 DIAGNOSIS — K641 Second degree hemorrhoids: Secondary | ICD-10-CM | POA: Diagnosis not present

## 2024-05-09 DIAGNOSIS — Z860101 Personal history of adenomatous and serrated colon polyps: Secondary | ICD-10-CM

## 2024-05-09 DIAGNOSIS — Z860100 Personal history of colon polyps, unspecified: Secondary | ICD-10-CM | POA: Diagnosis not present

## 2024-05-09 DIAGNOSIS — K2970 Gastritis, unspecified, without bleeding: Secondary | ICD-10-CM

## 2024-05-09 HISTORY — PX: ENDOSCOPIC MUCOSAL RESECTION: SHX6839

## 2024-05-09 HISTORY — PX: ESOPHAGOGASTRODUODENOSCOPY: SHX5428

## 2024-05-09 HISTORY — PX: HEMOSTASIS CLIP PLACEMENT: SHX6857

## 2024-05-09 HISTORY — PX: COLONOSCOPY: SHX5424

## 2024-05-09 HISTORY — PX: EUS: SHX5427

## 2024-05-09 LAB — GLUCOSE, CAPILLARY: Glucose-Capillary: 115 mg/dL — ABNORMAL HIGH (ref 70–99)

## 2024-05-09 SURGERY — ULTRASOUND, UPPER GI TRACT, ENDOSCOPIC
Anesthesia: Monitor Anesthesia Care

## 2024-05-09 MED ORDER — PHENYLEPHRINE HCL (PRESSORS) 10 MG/ML IV SOLN
INTRAVENOUS | Status: DC | PRN
  Administered 2024-05-09 (×2): 120 ug via INTRAVENOUS
  Administered 2024-05-09: 80 ug via INTRAVENOUS
  Administered 2024-05-09 (×3): 120 ug via INTRAVENOUS
  Administered 2024-05-09: 80 ug via INTRAVENOUS
  Administered 2024-05-09 (×2): 120 ug via INTRAVENOUS
  Administered 2024-05-09: 80 ug via INTRAVENOUS
  Administered 2024-05-09: 120 ug via INTRAVENOUS

## 2024-05-09 MED ORDER — PHENYLEPHRINE 80 MCG/ML (10ML) SYRINGE FOR IV PUSH (FOR BLOOD PRESSURE SUPPORT)
PREFILLED_SYRINGE | INTRAVENOUS | Status: DC | PRN
Start: 2024-05-09 — End: 2024-05-09
  Administered 2024-05-09: 160 ug via INTRAVENOUS

## 2024-05-09 MED ORDER — LIDOCAINE 2% (20 MG/ML) 5 ML SYRINGE
INTRAMUSCULAR | Status: DC | PRN
Start: 2024-05-09 — End: 2024-05-09
  Administered 2024-05-09: 80 mg via INTRAVENOUS

## 2024-05-09 MED ORDER — SODIUM CHLORIDE 0.9 % IV SOLN: INTRAVENOUS | Status: DC

## 2024-05-09 MED ORDER — PANTOPRAZOLE SODIUM 40 MG PO TBEC: 40.0000 mg | DELAYED_RELEASE_TABLET | Freq: Two times a day (BID) | ORAL | 12 refills | Status: AC

## 2024-05-09 MED ORDER — PROPOFOL 500 MG/50ML IV EMUL
INTRAVENOUS | Status: DC | PRN
  Administered 2024-05-09: 40 mg via INTRAVENOUS
  Administered 2024-05-09: 30 mg via INTRAVENOUS
  Administered 2024-05-09: 40 mg via INTRAVENOUS
  Administered 2024-05-09: 150 ug/kg/min via INTRAVENOUS
  Administered 2024-05-09: 40 mg via INTRAVENOUS

## 2024-05-09 MED ORDER — SODIUM CHLORIDE 0.9 % IV SOLN
INTRAVENOUS | Status: AC | PRN
  Administered 2024-05-09: 250 mL via INTRAMUSCULAR

## 2024-05-09 NOTE — Telephone Encounter (Signed)
 Called the office Rovonda has everything she needs already regarding Eliquis , NFN

## 2024-05-09 NOTE — Discharge Instructions (Signed)

## 2024-05-09 NOTE — Transfer of Care (Addendum)
 Immediate Anesthesia Transfer of Care Note  Patient: Andrew Adams  Procedure(s) Performed: ULTRASOUND, UPPER GI TRACT, ENDOSCOPIC COLONOSCOPY EGD (ESOPHAGOGASTRODUODENOSCOPY) RESECTION, MUCOSAL LESION, GI TRACT, ENDOSCOPIC CONTROL OF HEMORRHAGE, GI TRACT, ENDOSCOPIC, BY CLIPPING OR OVERSEWING  Patient Location: PACU  Anesthesia Type:MAC  Level of Consciousness: drowsy  Airway & Oxygen  Therapy: Patient Spontanous Breathing  Post-op Assessment: Report given to RN and Post -op Vital signs reviewed and stable  Post vital signs: Reviewed and stable  Last Vitals:  Vitals Value Taken Time  BP 91/59 05/09/24 12:43  Temp 36.3 C 05/09/24 12:43  Pulse 70 05/09/24 12:45  Resp 21 05/09/24 12:45  SpO2 90 % on RA 05/09/24 12:45  Vitals shown include unfiled device data.  Last Pain:  Vitals:   05/09/24 1243  TempSrc: Temporal  PainSc: 0-No pain         Complications: No notable events documented.

## 2024-05-09 NOTE — Telephone Encounter (Signed)
 Copied from CRM [id number]. Topic: Clinical - Prescription Issue >> May 06, 2024  3:43 PM Rilla NOVAK wrote: Reason for CRM: Blondie Barks [phone]) [facility]. Patient is scheduled for surgery. Per Rovonda, patient is on Eliquis  due to Pulm embolism. Can patient come off of Eliquis ? Need answer no later than 05/07/24 or they will have to cancel patient's surgery that has been scheduled since May. States she started reaching out in May when patient's surgery was scheduled and needs and answer.  Please call.   Noted.   Notified clinic -   NFN

## 2024-05-09 NOTE — Anesthesia Postprocedure Evaluation (Signed)
 Anesthesia Post Note  Patient: Andrew Adams  Procedure(s) Performed: ULTRASOUND, UPPER GI TRACT, ENDOSCOPIC COLONOSCOPY EGD (ESOPHAGOGASTRODUODENOSCOPY) RESECTION, MUCOSAL LESION, GI TRACT, ENDOSCOPIC CONTROL OF HEMORRHAGE, GI TRACT, ENDOSCOPIC, BY CLIPPING OR OVERSEWING     Patient location during evaluation: PACU Anesthesia Type: MAC Level of consciousness: awake and alert and oriented Pain management: pain level controlled Vital Signs Assessment: post-procedure vital signs reviewed and stable Respiratory status: spontaneous breathing, nonlabored ventilation and respiratory function stable Cardiovascular status: stable and blood pressure returned to baseline Postop Assessment: no apparent nausea or vomiting Anesthetic complications: no   No notable events documented.  Last Vitals:  Vitals:   05/09/24 1250 05/09/24 1310  BP: 129/67 127/66  Pulse: 70 69  Resp: 17 11  Temp:    SpO2: 91% 90%    Last Pain:  Vitals:   05/09/24 1250  TempSrc:   PainSc: 0-No pain                 Georgeana Oertel A.

## 2024-05-09 NOTE — Op Note (Signed)
 [facility] Patient Name: Andrew Adams Procedure Date: 05/09/2024 MRN: [id number] Attending MD: Aloha Finner , MD, [id number] Date of Birth: 12/04/1945 CSN: [id number] Age: 78 Admit Type: Outpatient Procedure:                Upper EUS Indications:              Duodenal deformity on endoscopy/Subepithelial tumor                            vs. extrinsic compression, Follow-up of polyps in                            the duodenum Providers:                Aloha Finner, MD, Hoy Penner, RN, Farris Southgate, Technician, Lorrayne Kitty, Technician Referring MD:              Medicines:                Monitored Anesthesia Care Complications:            No immediate complications. Estimated Blood Loss:     Estimated blood loss was minimal. Procedure:                Pre-Anesthesia Assessment:                           - Prior to the procedure, a History and Physical                            was performed, and patient medications and                            allergies were reviewed. The patient's tolerance of                            previous anesthesia was also reviewed. The risks                            and benefits of the procedure and the sedation                            options and risks were discussed with the patient.                            All questions were answered, and informed consent                            was obtained. Prior Anticoagulants: The patient has                            taken Eliquis  (apixaban ), last dose was 2 days  prior to procedure. ASA Grade Assessment: III - A                            patient with severe systemic disease. After                            reviewing the risks and benefits, the patient was                            deemed in satisfactory condition to undergo the                            procedure.                           After obtaining  informed consent, the endoscope was                            passed under direct vision. Throughout the                            procedure, the patient's blood pressure, pulse, and                            oxygen  saturations were monitored continuously. The                            [id number]-[id number] ([id number]) Olympus endoscope was introduced                            through the mouth, and advanced to the second part                            of duodenum. The GF-UE190-AL5 ([id number]) Olympus                            radial ultrasound scope was introduced through the                            mouth, and advanced to the duodenum for ultrasound                            examination from the stomach and duodenum. The                            upper EUS was accomplished without difficulty. The                            patient tolerated the procedure. Scope In: Scope Out: Findings:      ENDOSCOPIC FINDING: :      No gross lesions were noted in the entire esophagus.      A non-obstructing Schatzki ring was found at the gastroesophageal       junction.      The Z-line was irregular  and was found 40 cm from the incisors.      A 3 cm hiatal hernia was present.      Many small semi-sessile polyps with no bleeding and no stigmata of       recent bleeding were found in the entire examined stomach.      Multiple endoclips were found on the greater curvature of the stomach       and on the lesser curvature of the stomach.      Patchy mildly erythematous mucosa without bleeding was found in the       entire examined stomach. Biopsies were taken with a cold forceps for       histology and Helicobacter pylori testing.      A single 8 mm submucosal nodule was found in the duodenal bulb. After       the EUS completed, tunneled biopsies were taken with a cold forceps for       histology.      A medium post mucosectomy scar was found in the second portion of the       duodenum. There was evidence of  recurrent polyp tissue approximately 20       mm in size spanning over 1-1/2 folds. Adjacent mucosal findings include       congestion with tattoo noted proximal. There was a previously placed       Hemoclip in place with what appeared to be more granular tissue as a       result of foreign body effect rather than recurrent adenoma.       Preparations were made for mucosal resection. Demarcation of the lesion       was performed with high-definition white light and narrow band imaging       to clearly identify the boundaries of the lesion. EverLift was injected       to raise the lesion. Piecemeal mucosal resection using a snare was       performed. Resection and retrieval were complete. Resected tissue       margins were examined and clear of polyp tissue. To prevent bleeding       after mucosal resection, two hemostatic clips were successfully placed       (MR conditional). Clip manufacturer: [employer]. There was no       bleeding at the end of the procedure.      No other gross lesions were noted in the duodenal bulb, in the first       portion of the duodenum and in the second portion of the duodenum.      ENDOSONOGRAPHIC FINDING: :      A round intramural (subepithelial) lesion was found in the duodenal       bulb. The lesion was isoechoic. Endosonographically, the lesion appeared       to originate from within the submucosa (Layer 3). The lesion also       measured 8.3 mm by 5.7 mm in diameter. The outer margins were smooth.      No malignant-appearing lymph nodes were visualized in the perigastric       region and porta hepatis region.      Endosonographic imaging in the visualized portion of the liver showed no       mass.      The celiac region was visualized. Impression:               EGD impression:                           -  No gross lesions in the entire esophagus.                            Non-obstructing Schatzki ring. Z-line irregular, 40                             cm from the incisors.                           - 3 cm hiatal hernia.                           - Many gastric polyps. No evidence or stigmata of                            recent bleeding.                           - Multiple endoclips were found in the stomach from                            previous resections.                           - Erythematous mucosa in the stomach. Biopsied.                           - Submucosal nodule found in the duodenum bulb.                            Tunnel biopsied after EUS.                           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 Piecemeal mucosal resection performed.                            Resection and retrieval were complete. Clips (MR                            conditional) were placed. Clip manufacturer: [employer].                           - No other gross lesions in the duodenal bulb, in                            the first portion of the duodenum and in the second                            portion of the duodenum.  EUS impression:                           - An intramural (subepithelial) lesion was found in                            the duodenal bulb. The lesion appeared to originate                            from within the submucosa (Layer 3). Tissue has not                            been obtained. However, the endosonographic                            appearance is more suspicious for an atypical                            lipoma rather than a carcinoid since it was                            isoechoic and not hypoechoic. As noted above Tunnel                            biopsies performed.                           - No malignant-appearing lymph nodes were                            visualized in the perigastric region and porta                            hepatis region. Moderate Sedation:      Not Applicable -  Patient had care per Anesthesia. Recommendation:           - Proceed to scheduled colonoscopy.                           - Observe patient's clinical course.                           - Await path results.                           - Surveillance procedure to be considered in                            regards to EGD for Adenoma of duodenum in 1-year.                            If evidence of significant findings on the duodneal                            nodule maybe sooner followup will be needed.  Otherwise will perform EGD/EUS in 1-year (pending                            patient's overall medical health at the time).                           - PPI 40 mg twice daily.                           - The findings and recommendations were discussed                            with the patient.                           - The findings and recommendations were discussed                            with the patient's family. Procedure Code(s):        --- Professional ---                           [phone], Esophagogastroduodenoscopy, flexible,                            transoral; with endoscopic mucosal resection                           43237, Esophagogastroduodenoscopy, flexible,                            transoral; with endoscopic ultrasound examination                            limited to the esophagus, stomach or duodenum, and                            adjacent structures                           43239, 59, Esophagogastroduodenoscopy, flexible,                            transoral; with biopsy, single or multiple Diagnosis Code(s):        --- Professional ---                           K22.2, Esophageal obstruction                           K22.89, Other specified disease of esophagus                           K44.9, Diaphragmatic hernia without obstruction or                            gangrene  K31.7, Polyp of stomach and duodenum                            T18.2XXA, Foreign body in stomach, initial encounter                           K31.89, Other diseases of stomach and duodenum                           I89.9, Noninfective disorder of lymphatic vessels                            and lymph nodes, unspecified CPT copyright 2022 American Medical Association. All rights reserved. The codes documented in this report are preliminary and upon coder review may  be revised to meet current compliance requirements. Aloha Finner, MD 05/09/2024 12:44:55 PM Number of Addenda: 0

## 2024-05-09 NOTE — Telephone Encounter (Signed)
 This has already been address, see previous telephone note  Per Cardiology-   Hold Eliquis  for 2 days prior to procedure and resume when safe from bleeding standpoint.   Patient's last dose was 05/07/24- per patient and his wife.

## 2024-05-09 NOTE — Anesthesia Preprocedure Evaluation (Signed)
 Anesthesia Evaluation  Patient identified by MRN, date of birth, ID band Patient awake    Reviewed: Allergy & Precautions, NPO status , Patient's Chart, lab work & pertinent test results, reviewed documented beta blocker date and time   Airway Mallampati: III  TM Distance: >3 FB     Dental  (+) Edentulous Upper, Partial Lower   Pulmonary sleep apnea , pneumonia, resolved, COPD,  COPD inhaler, former smoker   Pulmonary exam normal breath sounds clear to auscultation       Cardiovascular hypertension, Pt. on medications and Pt. on home beta blockers +CHF and + DOE  Normal cardiovascular exam Rhythm:Regular Rate:Normal     Neuro/Psych  PSYCHIATRIC DISORDERS       Neuromuscular disease    GI/Hepatic ,GERD  Medicated,,Duodenal adenoma  Hx/o colon polyp   Endo/Other  diabetes, Well Controlled, Type 2, Oral Hypoglycemic Agents  GLP-1 RA therapy- last dose 7/4  Renal/GU Renal InsufficiencyRenal disease  negative genitourinary   Musculoskeletal  (+) Arthritis , Osteoarthritis,    Abdominal   Peds  Hematology  (+) Blood dyscrasia, anemia Eliquis  therapy- last dose 7/8   Anesthesia Other Findings   Reproductive/Obstetrics                              Anesthesia Physical Anesthesia Plan  ASA: 3  Anesthesia Plan: MAC   Post-op Pain Management: Minimal or no pain anticipated   Induction: Intravenous  PONV Risk Score and Plan: 2 and Treatment may vary due to age or medical condition and Propofol  infusion  Airway Management Planned: Natural Airway, Simple Face Mask and Nasal Cannula  Additional Equipment: None  Intra-op Plan:   Post-operative Plan:   Informed Consent: I have reviewed the patients History and Physical, chart, labs and discussed the procedure including the risks, benefits and alternatives for the proposed anesthesia with the patient or authorized representative who has  indicated his/her understanding and acceptance.     Dental advisory given  Plan Discussed with: CRNA and Anesthesiologist  Anesthesia Plan Comments:          Anesthesia Quick Evaluation

## 2024-05-09 NOTE — Op Note (Signed)
 [facility] Patient Name: Andrew Adams Procedure Date: 05/09/2024 MRN: [id number] Attending MD: Aloha Finner , MD, [id number] Date of Birth: October 08, 1946 CSN: [id number] Age: 78 Admit Type: Outpatient Procedure:                Colonoscopy Indications:              Surveillance: Personal history of adenomatous                            polyps on last colonoscopy > 3 years ago Providers:                Aloha Finner, MD, Hoy Penner, RN, Farris Southgate, Technician Referring MD:              Medicines:                Monitored Anesthesia Care Complications:            No immediate complications. Estimated Blood Loss:     Estimated blood loss was minimal. Procedure:                Pre-Anesthesia Assessment:                           - Prior to the procedure, a History and Physical                            was performed, and patient medications and                            allergies were reviewed. The patient's tolerance of                            previous anesthesia was also reviewed. The risks                            and benefits of the procedure and the sedation                            options and risks were discussed with the patient.                            All questions were answered, and informed consent                            was obtained. Prior Anticoagulants: The patient has                            taken Eliquis  (apixaban ), last dose was 2 days                            prior to procedure. ASA Grade Assessment: III - A  patient with severe systemic disease. After                            reviewing the risks and benefits, the patient was                            deemed in satisfactory condition to undergo the                            procedure.                           After obtaining informed consent, the colonoscope                            was passed under direct  vision. Throughout the                            procedure, the patient's blood pressure, pulse, and                            oxygen  saturations were monitored continuously. The                            [id number]-[id number] ([id number]) Olympus colonoscope was                            introduced through the anus and advanced to the the                            cecum, identified by appendiceal orifice and                            ileocecal valve. The colonoscopy was performed                            without difficulty. The patient tolerated the                            procedure. The quality of the bowel preparation was                            adequate. The ileocecal valve, appendiceal orifice,                            and rectum were photographed. Scope In: 12:17:37 PM Scope Out: 12:35:51 PM Scope Withdrawal Time: 0 hours 14 minutes 19 seconds  Total Procedure Duration: 0 hours 18 minutes 14 seconds  Findings:      The digital rectal exam findings include hemorrhoids. Pertinent       negatives include no palpable rectal lesions.      A large amount of semi-liquid stool was found in the entire colon,       interfering with visualization. Lavage of the area was performed using       copious amounts, resulting in  clearance with adequate visualization.      Six sessile polyps were found in the transverse colon (1), ascending       colon (2) (3) and cecum. The polyps were 2 to 5 mm in size. These polyps       were removed with a cold snare. Resection and retrieval were complete.      Many large-mouthed, medium-mouthed and small-mouthed diverticula were       found in the entire colon.      Normal mucosa was found in the entire colon otherwise.      Non-bleeding non-thrombosed internal hemorrhoids were found during       retroflexion, during perianal exam and during digital exam. The       hemorrhoids were Grade II (internal hemorrhoids that prolapse but reduce        spontaneously). Impression:               - Hemorrhoids found on digital rectal exam.                           - Stool in the entire examined colon. Lavaged with                            adequate visualization.                           - Six 2 to 5 mm polyps in the transverse colon, in                            the ascending colon and in the cecum, removed with                            a cold snare. Resected and retrieved.                           - Diverticulosis in the entire examined colon.                           - Normal mucosa in the entire examined colon                            otherwise.                           - Non-bleeding non-thrombosed internal hemorrhoids. Moderate Sedation:      Not Applicable - Patient had care per Anesthesia. Recommendation:           - The patient will be observed post-procedure,                            until all discharge criteria are met.                           - Discharge patient to home.                           - Patient has a contact number available for  emergencies. The signs and symptoms of potential                            delayed complications were discussed with the                            patient. Return to normal activities tomorrow.                            Written discharge instructions were provided to the                            patient.                           - High fiber diet.                           - Use FiberCon 1-2 tablets PO daily.                           - May restart Eliquis  on 7/12 PM to decrease risk                            of post interventional bleeding.                           - Continue present medications.                           - Await pathology results.                           - Repeat colonoscopy for surveillance.                           - The findings and recommendations were discussed                            with the  patient. Procedure Code(s):        --- Professional ---                           [phone], Colonoscopy, flexible; with removal of                            tumor(s), polyp(s), or other lesion(s) by snare                            technique Diagnosis Code(s):        --- Professional ---                           Z86.010, Personal history of colonic polyps                           K64.1, Second degree hemorrhoids  D12.3, Benign neoplasm of transverse colon (hepatic                            flexure or splenic flexure)                           D12.2, Benign neoplasm of ascending colon                           D12.0, Benign neoplasm of cecum                           K57.30, Diverticulosis of large intestine without                            perforation or abscess without bleeding CPT copyright 2022 American Medical Association. All rights reserved. The codes documented in this report are preliminary and upon coder review may  be revised to meet current compliance requirements. Aloha Finner, MD 05/09/2024 12:54:20 PM Number of Addenda: 0

## 2024-05-09 NOTE — H&P (Signed)
 GASTROENTEROLOGY PROCEDURE H&P NOTE   Primary Care Physician: Jolee Elsie RAMAN, PA  HPI: Andrew Adams is a 78 y.o. male  who presents for EGD/EUS upper and colonoscopy.  Patient with history of previous duodenal TVA post resection for surveillance and also duodenal nodule for ultrasound evaluation.  Patient with history of previous adenomas here for surveillance.  Past Medical History:  Diagnosis Date   Anemia    Borderline diabetic    Chronic kidney disease    Diabetes mellitus without complication (HCC)    Duodenal adenoma    2021   GERD (gastroesophageal reflux disease)    Gout    Gouty arthritis    H/O small bowel obstruction    History of back surgery    Hx of adenomatous colonic polyps    2021   Hyperlipidemia    Hypertension    PE (pulmonary embolism)    Sleep apnea    Past Surgical History:  Procedure Laterality Date   ABDOMINAL SURGERY  2103   Smal bowel obstruction    ABDOMINAL SURGERY  1981   Perforated large intestine   back     Lower back   BIOPSY  08/28/2020   Procedure: BIOPSY;  Surgeon: Eartha Angelia Sieving, MD;  Location: [facility];  Service: Gastroenterology;;   BIOPSY  02/07/2022   Procedure: BIOPSY;  Surgeon: Wilhelmenia Aloha Raddle., MD;  Location: [facility];  Service: Gastroenterology;;   CATARACT EXTRACTION W/PHACO Right 01/06/2020   Procedure: CATARACT EXTRACTION PHACO AND INTRAOCULAR LENS PLACEMENT (IOC) (CDE: 6.48);  Surgeon: Harrie Agent, MD;  Location: [facility];  Service: Ophthalmology;  Laterality: Right;   CATARACT EXTRACTION W/PHACO Left 01/20/2020   Procedure: CATARACT EXTRACTION PHACO AND INTRAOCULAR LENS PLACEMENT (IOC);  Surgeon: Harrie Agent, MD;  Location: [facility];  Service: Ophthalmology;  Laterality: Left;  CDE: 5.86   COLONOSCOPY WITH PROPOFOL  N/A 08/28/2020   Procedure: COLONOSCOPY WITH PROPOFOL ;  Surgeon: Eartha Angelia Sieving, MD;  [facility]: [facility];  Service: Gastroenterology;  Laterality: N/A;  230    ENDOSCOPIC MUCOSAL RESECTION N/A 02/07/2022   Procedure: ENDOSCOPIC MUCOSAL RESECTION;  Surgeon: Wilhelmenia Aloha Raddle., MD;  Location: [facility];  Service: Gastroenterology;  Laterality: N/A;   ESOPHAGOGASTRODUODENOSCOPY N/A 02/07/2022   Procedure: ESOPHAGOGASTRODUODENOSCOPY (EGD);  Surgeon: Wilhelmenia Aloha Raddle., MD;  Location: [facility];  Service: Gastroenterology;  Laterality: N/A;   ESOPHAGOGASTRODUODENOSCOPY (EGD) WITH PROPOFOL  N/A 08/28/2020   Procedure: ESOPHAGOGASTRODUODENOSCOPY (EGD) WITH PROPOFOL ;  Surgeon: Eartha Angelia Sieving, MD;  [facility]: [facility];  Service: Gastroenterology;  Laterality: N/A;   ESOPHAGOGASTRODUODENOSCOPY (EGD) WITH PROPOFOL  N/A 12/14/2020   Procedure: ESOPHAGOGASTRODUODENOSCOPY (EGD) WITH PROPOFOL ;  Surgeon: Wilhelmenia Aloha Raddle., MD;  Location: [facility];  Service: Gastroenterology;  Laterality: N/A;   GIVENS CAPSULE STUDY N/A 09/10/2020   Procedure: GIVENS CAPSULE STUDY;  Surgeon: Eartha Angelia Sieving, MD;  Location: [facility];  Service: Gastroenterology;  Laterality: N/A;  730   HEMOSTASIS CLIP PLACEMENT  12/14/2020   Procedure: HEMOSTASIS CLIP PLACEMENT;  Surgeon: Wilhelmenia Aloha Raddle., MD;  Location: [facility];  Service: Gastroenterology;;   HEMOSTASIS CLIP PLACEMENT  02/07/2022   Procedure: HEMOSTASIS CLIP PLACEMENT;  Surgeon: Wilhelmenia Aloha Raddle., MD;  Location: [facility];  Service: Gastroenterology;;   HEMOSTASIS CONTROL  12/14/2020   Procedure: HEMOSTASIS CONTROL;  Surgeon: Wilhelmenia Aloha Raddle., MD;  Location: [facility];  Service: Gastroenterology;;   HERNIA REPAIR     incisional   HOT HEMOSTASIS N/A 02/07/2022   Procedure: HOT HEMOSTASIS (ARGON PLASMA COAGULATION/BICAP);  Surgeon:  Mansouraty, Aloha Raddle., MD;  Location: [facility];  Service: Gastroenterology;  Laterality: N/A;   POLYPECTOMY  08/28/2020   Procedure: POLYPECTOMY;  Surgeon: Eartha Angelia Sieving, MD;  Location: [facility];  Service:  Gastroenterology;;   POLYPECTOMY  12/14/2020   Procedure: POLYPECTOMY;  Surgeon: Wilhelmenia Aloha Raddle., MD;  Location: [facility];  Service: Gastroenterology;;   MATIAS  12/14/2020   Procedure: MATIAS;  Surgeon: Mansouraty, Aloha Raddle., MD;  Location: [facility];  Service: Gastroenterology;;   ROBLEY MEYER INJECTION  02/07/2022   Procedure: SUBMUCOSAL LIFTING INJECTION;  Surgeon: Wilhelmenia Aloha Raddle., MD;  Location: [facility];  Service: Gastroenterology;;   SUBMUCOSAL TATTOO INJECTION  12/14/2020   Procedure: SUBMUCOSAL TATTOO INJECTION;  Surgeon: Wilhelmenia Aloha Raddle., MD;  Location: [facility];  Service: Gastroenterology;;   No current facility-administered medications for this encounter.   No current facility-administered medications for this encounter. No Known Allergies Family History  Problem Relation Age of Onset   Hypertension Sister    Diabetes Sister    Diabetes Sister    Hypertension Mother    Diabetes Mother    Hypertension Father    Ulcers Father    Colon cancer Neg Hx    Esophageal cancer Neg Hx    Stomach cancer Neg Hx    Inflammatory bowel disease Neg Hx    Liver disease Neg Hx    Pancreatic cancer Neg Hx    Rectal cancer Neg Hx    Social History   Socioeconomic History   Marital status: Married    Spouse name: Not on file   Number of children: 1   Years of education: Not on file   Highest education level: Not on file  Occupational History   Occupation: Finisher  Tobacco Use   Smoking status: Former    Current packs/day: 0.00    Average packs/day: 1 pack/day for 38.0 years (38.0 ttl pk-yrs)    Types: Cigarettes    Start date: 02/06/1957    Quit date: 02/07/1995    Years since quitting: 29.2   Smokeless tobacco: Never  Vaping Use   Vaping status: Never Used  Substance and Sexual Activity   Alcohol  use: Yes    Alcohol /week: 2.0 standard drinks of alcohol     Types: 2 Shots of liquor per week    Comment: occasional     Drug use: Not Currently    Types: Marijuana   Sexual activity: Yes  Other Topics Concern   Not on file  Social History Narrative   Lives with wife.     Social Drivers of Corporate investment banker Strain: Not on file  Food Insecurity: No Food Insecurity (01/31/2023)   Hunger Vital Sign    Worried About Running Out of Food in the Last Year: Never true    Ran Out of Food in the Last Year: Never true  Transportation Needs: No Transportation Needs (01/31/2023)   PRAPARE - Administrator, Civil Service (Medical): No    Lack of Transportation (Non-Medical): No  Physical Activity: Not on file  Stress: Not on file  Social Connections: Not on file  Intimate Partner Violence: Not At Risk (01/31/2023)   Humiliation, Afraid, Rape, and Kick questionnaire    Fear of Current or Ex-Partner: No    Emotionally Abused: No    Physically Abused: No    Sexually Abused: No    Physical Exam: There were no vitals filed for this visit. There is no height or weight on file to calculate  BMI. GEN: NAD EYE: Sclerae anicteric ENT: MMM CV: Non-tachycardic GI: Soft, NT/ND NEURO:  Alert & Oriented x 3  Lab Results: No results for input(s): WBC, HGB, HCT, PLT in the last 72 hours. BMET No results for input(s): NA, K, CL, CO2, GLUCOSE, BUN, CREATININE, CALCIUM  in the last 72 hours. LFT No results for input(s): PROT, ALBUMIN , AST, ALT, ALKPHOS, BILITOT, BILIDIR, IBILI in the last 72 hours. PT/INR No results for input(s): LABPROT, INR in the last 72 hours.   Impression / Plan: This is a 78 y.o.male who presents for EGD/EUS upper and colonoscopy.  Patient with history of previous duodenal TVA post resection for surveillance and also duodenal nodule for ultrasound evaluation.  Patient with history of previous adenomas here for surveillance.  The risks of an EUS including intestinal perforation, bleeding, infection, aspiration, and medication effects were  discussed as was the possibility it may not give a definitive diagnosis if a biopsy is performed.  When a biopsy of the pancreas is done as part of the EUS, there is an additional risk of pancreatitis at the rate of about 1-2%.  It was explained that procedure related pancreatitis is typically mild, although it can be severe and even life threatening, which is why we do not perform random pancreatic biopsies and only biopsy a lesion/area we feel is concerning enough to warrant the risk.  The risks and benefits of endoscopic evaluation/treatment were discussed with the patient and/or family; these include but are not limited to the risk of perforation, infection, bleeding, missed lesions, lack of diagnosis, severe illness requiring hospitalization, as well as anesthesia and sedation related illnesses.  The patient's history has been reviewed, patient examined, no change in status, and deemed stable for procedure.  The patient and/or family is agreeable to proceed.    Aloha Finner, MD [facility] Office # [phone]

## 2024-05-10 ENCOUNTER — Telehealth: Payer: Self-pay

## 2024-05-10 LAB — SURGICAL PATHOLOGY

## 2024-05-10 NOTE — Telephone Encounter (Signed)
 Copied from CRM [id number]. Topic: General - Call Back - No Documentation >> May 10, 2024 12:36 PM Celestine F wrote: Reason for CRM: Pt missed a call from the clinic and wasn't sure what it was in reference to. I believe CMA Amando Chaput was attempting to reach the patient. Please call the patient back at [phone] ok to leave a vm. When leaving a note that you called the patient, please leave more details for the e2c2 to assist when the patient calls back if after missing the phone call.   The detail was already in the note per Rovonda, CMA (05-08-24)    ATC. LMTCB. Completing CRM as there is already an encounter for this.

## 2024-05-10 NOTE — Telephone Encounter (Signed)
 ATC X1. LMTCB

## 2024-05-10 NOTE — Progress Notes (Signed)
 nfn

## 2024-05-10 NOTE — Telephone Encounter (Signed)
 ATC X2. LMTCB. I will send pt a letter in the mail as he does not have Mychart. Completing note per protocol. NFN

## 2024-05-12 ENCOUNTER — Ambulatory Visit: Payer: Self-pay | Admitting: Gastroenterology

## 2024-05-13 ENCOUNTER — Telehealth: Payer: Self-pay

## 2024-05-13 NOTE — Telephone Encounter (Signed)
 Copied from CRM [id number]. Topic: Clinical - Medical Advice >> May 10, 2024  2:36 PM Andrew Adams wrote: Reason for CRM: Patient once again returning call to Andrew Adams. Attempted to advise patient that issue calling for has already been addressed, therefore they did not need anything? If this is not correct, please reach out to patient and leave detailed voice message. Patient displeased that all he had was a name and callback number.  Numerous encounters have been made for this. I sent a letter out to the pt regarding his medication because attempted calls have been made with no success in reaching pt. NFN

## 2024-05-14 ENCOUNTER — Inpatient Hospital Stay

## 2024-05-14 VITALS — BP 126/78 | HR 78 | Temp 97.0°F | Resp 18

## 2024-05-14 DIAGNOSIS — D509 Iron deficiency anemia, unspecified: Secondary | ICD-10-CM | POA: Diagnosis not present

## 2024-05-14 DIAGNOSIS — Z7901 Long term (current) use of anticoagulants: Secondary | ICD-10-CM | POA: Diagnosis not present

## 2024-05-14 DIAGNOSIS — N184 Chronic kidney disease, stage 4 (severe): Secondary | ICD-10-CM

## 2024-05-14 DIAGNOSIS — E1122 Type 2 diabetes mellitus with diabetic chronic kidney disease: Secondary | ICD-10-CM | POA: Diagnosis not present

## 2024-05-14 DIAGNOSIS — D631 Anemia in chronic kidney disease: Secondary | ICD-10-CM | POA: Diagnosis not present

## 2024-05-14 DIAGNOSIS — K219 Gastro-esophageal reflux disease without esophagitis: Secondary | ICD-10-CM | POA: Diagnosis not present

## 2024-05-14 DIAGNOSIS — I129 Hypertensive chronic kidney disease with stage 1 through stage 4 chronic kidney disease, or unspecified chronic kidney disease: Secondary | ICD-10-CM | POA: Diagnosis not present

## 2024-05-14 DIAGNOSIS — Z86711 Personal history of pulmonary embolism: Secondary | ICD-10-CM | POA: Diagnosis not present

## 2024-05-14 DIAGNOSIS — E785 Hyperlipidemia, unspecified: Secondary | ICD-10-CM | POA: Diagnosis not present

## 2024-05-14 DIAGNOSIS — Z79899 Other long term (current) drug therapy: Secondary | ICD-10-CM | POA: Diagnosis not present

## 2024-05-14 DIAGNOSIS — Z7985 Long-term (current) use of injectable non-insulin antidiabetic drugs: Secondary | ICD-10-CM | POA: Diagnosis not present

## 2024-05-14 DIAGNOSIS — M109 Gout, unspecified: Secondary | ICD-10-CM | POA: Diagnosis not present

## 2024-05-14 DIAGNOSIS — Z809 Family history of malignant neoplasm, unspecified: Secondary | ICD-10-CM | POA: Diagnosis not present

## 2024-05-14 DIAGNOSIS — G4730 Sleep apnea, unspecified: Secondary | ICD-10-CM | POA: Diagnosis not present

## 2024-05-14 DIAGNOSIS — Z87891 Personal history of nicotine dependence: Secondary | ICD-10-CM | POA: Diagnosis not present

## 2024-05-14 LAB — CBC
HCT: 29.6 % — ABNORMAL LOW (ref 39.0–52.0)
Hemoglobin: 9.8 g/dL — ABNORMAL LOW (ref 13.0–17.0)
MCH: 28.7 pg (ref 26.0–34.0)
MCHC: 33.1 g/dL (ref 30.0–36.0)
MCV: 86.8 fL (ref 80.0–100.0)
Platelets: 179 K/uL (ref 150–400)
RBC: 3.41 MIL/uL — ABNORMAL LOW (ref 4.22–5.81)
RDW: 14.6 % (ref 11.5–15.5)
WBC: 6.6 K/uL (ref 4.0–10.5)
nRBC: 0 % (ref 0.0–0.2)

## 2024-05-14 MED ORDER — EPOETIN ALFA-EPBX 10000 UNIT/ML IJ SOLN
10000.0000 [IU] | Freq: Once | INTRAMUSCULAR | Status: AC
  Administered 2024-05-14: 10000 [IU] via SUBCUTANEOUS
  Filled 2024-05-14: qty 1

## 2024-05-14 NOTE — Progress Notes (Signed)
Patient presents today for Retacrit injection. Hemoglobin reviewed prior to administration. VSS tolerated without incident or complaint. See MAR for details. Patient stable during and after injection. Patient discharged in satisfactory condition with no s/s of distress noted.  

## 2024-05-14 NOTE — Patient Instructions (Signed)

## 2024-05-16 ENCOUNTER — Other Ambulatory Visit: Payer: Self-pay | Admitting: *Deleted

## 2024-05-16 ENCOUNTER — Telehealth: Payer: Self-pay | Admitting: *Deleted

## 2024-05-16 DIAGNOSIS — N184 Chronic kidney disease, stage 4 (severe): Secondary | ICD-10-CM

## 2024-05-16 NOTE — Telephone Encounter (Signed)
 Copied from CRM [id number]. Topic: General - Other >> May 16, 2024  1:11 PM Whitney O wrote: Reason for CRM: patient is calling cause he did a sleep study at [facility] to be taken off of oxygen  and he passed patient say he have not heard anything from the office for him to be taken off the oxygen  please reach out to patient concerning this  [phone]  Called the pt and there was no answer- no option to leave msg. Routing to Ashlyn for continued fu

## 2024-05-21 ENCOUNTER — Other Ambulatory Visit (HOSPITAL_COMMUNITY)
Admission: RE | Admit: 2024-05-21 | Discharge: 2024-05-21 | Disposition: A | Discharge status: Home / Self Care | Source: Ambulatory Visit | Attending: Nephrology | Admitting: Nephrology

## 2024-05-21 DIAGNOSIS — N185 Chronic kidney disease, stage 5: Secondary | ICD-10-CM | POA: Insufficient documentation

## 2024-05-21 DIAGNOSIS — D631 Anemia in chronic kidney disease: Secondary | ICD-10-CM | POA: Insufficient documentation

## 2024-05-21 DIAGNOSIS — E211 Secondary hyperparathyroidism, not elsewhere classified: Secondary | ICD-10-CM | POA: Diagnosis not present

## 2024-05-21 LAB — RENAL FUNCTION PANEL
Albumin: 4.2 g/dL (ref 3.5–5.0)
Anion gap: 12 (ref 5–15)
BUN: 85 mg/dL — ABNORMAL HIGH (ref 8–23)
CO2: 20 mmol/L — ABNORMAL LOW (ref 22–32)
Calcium: 8.8 mg/dL — ABNORMAL LOW (ref 8.9–10.3)
Chloride: 98 mmol/L (ref 98–111)
Creatinine, Ser: 4.67 mg/dL — ABNORMAL HIGH (ref 0.61–1.24)
GFR, Estimated: 12 mL/min — ABNORMAL LOW (ref >60)
Glucose, Bld: 120 mg/dL — ABNORMAL HIGH (ref 70–99)
Phosphorus: 4.3 mg/dL (ref 2.5–4.6)
Potassium: 4.3 mmol/L (ref 3.5–5.1)
Sodium: 130 mmol/L — ABNORMAL LOW (ref 135–145)

## 2024-05-21 LAB — PROTEIN / CREATININE RATIO, URINE
Creatinine, Urine: 85 mg/dL
Protein Creatinine Ratio: 0.08 mg/mg{creat} (ref 0.00–0.15)
Total Protein, Urine: 7 mg/dL

## 2024-05-21 LAB — CBC
HCT: 31 % — ABNORMAL LOW (ref 39.0–52.0)
Hemoglobin: 10.6 g/dL — ABNORMAL LOW (ref 13.0–17.0)
MCH: 29.4 pg (ref 26.0–34.0)
MCHC: 34.2 g/dL (ref 30.0–36.0)
MCV: 86.1 fL (ref 80.0–100.0)
Platelets: 225 K/uL (ref 150–400)
RBC: 3.6 MIL/uL — ABNORMAL LOW (ref 4.22–5.81)
RDW: 14.4 % (ref 11.5–15.5)
WBC: 5 K/uL (ref 4.0–10.5)
nRBC: 0 % (ref 0.0–0.2)

## 2024-05-21 LAB — IRON AND TIBC
Iron: 73 ug/dL (ref 45–182)
Saturation Ratios: 26 % (ref 17.9–39.5)
TIBC: 286 ug/dL (ref 250–450)
UIBC: 213 ug/dL

## 2024-05-21 LAB — FERRITIN: Ferritin: 269 ng/mL (ref 24–336)

## 2024-05-21 LAB — HEPATITIS B SURFACE ANTIBODY,QUALITATIVE: Hep B S Ab: NONREACTIVE

## 2024-05-21 LAB — HEPATITIS B SURFACE ANTIGEN: Hepatitis B Surface Ag: NONREACTIVE

## 2024-05-21 NOTE — Telephone Encounter (Signed)
 Im not sure if this pt did the ONO per lov. The order still states it needs to be scheduled. PCC's, can you look into this?

## 2024-05-22 ENCOUNTER — Ambulatory Visit
Admission: RE | Admit: 2024-05-22 | Discharge: 2024-05-22 | Disposition: A | Discharge status: Home / Self Care | Source: Ambulatory Visit | Attending: Adult Health | Admitting: Adult Health

## 2024-05-22 DIAGNOSIS — R918 Other nonspecific abnormal finding of lung field: Secondary | ICD-10-CM | POA: Diagnosis not present

## 2024-05-22 DIAGNOSIS — R911 Solitary pulmonary nodule: Secondary | ICD-10-CM | POA: Diagnosis not present

## 2024-05-22 DIAGNOSIS — J439 Emphysema, unspecified: Secondary | ICD-10-CM | POA: Diagnosis not present

## 2024-05-22 LAB — HCV INTERPRETATION

## 2024-05-22 LAB — PARATHYROID HORMONE, INTACT (NO CA): PTH: 179 pg/mL — ABNORMAL HIGH (ref 15–65)

## 2024-05-22 LAB — HEPATITIS B CORE ANTIBODY, TOTAL: HEP B CORE AB: POSITIVE — AB

## 2024-05-22 LAB — HCV AB W REFLEX TO QUANT PCR: HCV Ab: NONREACTIVE

## 2024-05-23 ENCOUNTER — Other Ambulatory Visit (HOSPITAL_COMMUNITY): Payer: Self-pay | Admitting: Nephrology

## 2024-05-23 ENCOUNTER — Telehealth: Payer: Self-pay | Admitting: Primary Care

## 2024-05-23 DIAGNOSIS — N185 Chronic kidney disease, stage 5: Secondary | ICD-10-CM | POA: Diagnosis not present

## 2024-05-23 DIAGNOSIS — N179 Acute kidney failure, unspecified: Secondary | ICD-10-CM | POA: Diagnosis not present

## 2024-05-23 DIAGNOSIS — G4733 Obstructive sleep apnea (adult) (pediatric): Secondary | ICD-10-CM | POA: Diagnosis not present

## 2024-05-23 DIAGNOSIS — E1122 Type 2 diabetes mellitus with diabetic chronic kidney disease: Secondary | ICD-10-CM

## 2024-05-23 DIAGNOSIS — E1129 Type 2 diabetes mellitus with other diabetic kidney complication: Secondary | ICD-10-CM

## 2024-05-23 DIAGNOSIS — E871 Hypo-osmolality and hyponatremia: Secondary | ICD-10-CM | POA: Diagnosis not present

## 2024-05-23 DIAGNOSIS — N184 Chronic kidney disease, stage 4 (severe): Secondary | ICD-10-CM

## 2024-05-23 NOTE — Telephone Encounter (Signed)
 Copied from CRM #[id number]. Topic: Appointments - Scheduling Inquiry for Clinic >> May 23, 2024 11:37 AM Corean SAUNDERS wrote: Reason for CRM: Patient is seeking to schedule a follow up with Almarie Ferrari but E2C2 does not show availability for the provider. Please call patient back to advise.  LVM for patient to call and discuss scheduling.  WIll offer [facility] or [facility] location

## 2024-05-25 LAB — QUANTIFERON-TB GOLD PLUS: QuantiFERON-TB Gold Plus: NEGATIVE

## 2024-05-25 LAB — QUANTIFERON-TB GOLD PLUS (RQFGPL)
QuantiFERON Mitogen Value: >10 [IU]/mL
QuantiFERON Nil Value: 0.49 [IU]/mL
QuantiFERON TB1 Ag Value: 0.38 [IU]/mL
QuantiFERON TB2 Ag Value: 0.44 [IU]/mL

## 2024-05-27 NOTE — Telephone Encounter (Signed)
 LVM for patient to call and discuss scheduling

## 2024-05-27 NOTE — Telephone Encounter (Signed)
 You can double book him on a day that I am not already, can be virtual. Is he currently using CPAP ?

## 2024-05-28 ENCOUNTER — Inpatient Hospital Stay

## 2024-05-28 VITALS — BP 119/80 | HR 78 | Temp 97.5°F | Resp 18

## 2024-05-28 DIAGNOSIS — K219 Gastro-esophageal reflux disease without esophagitis: Secondary | ICD-10-CM | POA: Diagnosis not present

## 2024-05-28 DIAGNOSIS — M109 Gout, unspecified: Secondary | ICD-10-CM | POA: Diagnosis not present

## 2024-05-28 DIAGNOSIS — D509 Iron deficiency anemia, unspecified: Secondary | ICD-10-CM | POA: Diagnosis not present

## 2024-05-28 DIAGNOSIS — Z86711 Personal history of pulmonary embolism: Secondary | ICD-10-CM | POA: Diagnosis not present

## 2024-05-28 DIAGNOSIS — D631 Anemia in chronic kidney disease: Secondary | ICD-10-CM | POA: Diagnosis not present

## 2024-05-28 DIAGNOSIS — I129 Hypertensive chronic kidney disease with stage 1 through stage 4 chronic kidney disease, or unspecified chronic kidney disease: Secondary | ICD-10-CM | POA: Diagnosis not present

## 2024-05-28 DIAGNOSIS — E1122 Type 2 diabetes mellitus with diabetic chronic kidney disease: Secondary | ICD-10-CM | POA: Diagnosis not present

## 2024-05-28 DIAGNOSIS — E785 Hyperlipidemia, unspecified: Secondary | ICD-10-CM | POA: Diagnosis not present

## 2024-05-28 DIAGNOSIS — G4730 Sleep apnea, unspecified: Secondary | ICD-10-CM | POA: Diagnosis not present

## 2024-05-28 DIAGNOSIS — Z87891 Personal history of nicotine dependence: Secondary | ICD-10-CM | POA: Diagnosis not present

## 2024-05-28 DIAGNOSIS — N184 Chronic kidney disease, stage 4 (severe): Secondary | ICD-10-CM

## 2024-05-28 DIAGNOSIS — Z809 Family history of malignant neoplasm, unspecified: Secondary | ICD-10-CM | POA: Diagnosis not present

## 2024-05-28 DIAGNOSIS — Z7901 Long term (current) use of anticoagulants: Secondary | ICD-10-CM | POA: Diagnosis not present

## 2024-05-28 DIAGNOSIS — Z79899 Other long term (current) drug therapy: Secondary | ICD-10-CM | POA: Diagnosis not present

## 2024-05-28 DIAGNOSIS — Z7985 Long-term (current) use of injectable non-insulin antidiabetic drugs: Secondary | ICD-10-CM | POA: Diagnosis not present

## 2024-05-28 LAB — CBC
HCT: 29.5 % — ABNORMAL LOW (ref 39.0–52.0)
Hemoglobin: 10 g/dL — ABNORMAL LOW (ref 13.0–17.0)
MCH: 28.7 pg (ref 26.0–34.0)
MCHC: 33.9 g/dL (ref 30.0–36.0)
MCV: 84.8 fL (ref 80.0–100.0)
Platelets: 173 K/uL (ref 150–400)
RBC: 3.48 MIL/uL — ABNORMAL LOW (ref 4.22–5.81)
RDW: 14.3 % (ref 11.5–15.5)
WBC: 5.3 K/uL (ref 4.0–10.5)
nRBC: 0 % (ref 0.0–0.2)

## 2024-05-28 MED ORDER — EPOETIN ALFA-EPBX 3000 UNIT/ML IJ SOLN
10000.0000 [IU] | Freq: Once | INTRAMUSCULAR | Status: AC
  Administered 2024-05-28: 10000 [IU] via SUBCUTANEOUS
  Filled 2024-05-28: qty 4

## 2024-05-28 NOTE — Progress Notes (Signed)
 Patient's Hgb 10 and blood pressure stable. Patient  tolerated injection with no complaints voiced.  Site clean and dry with no bruising or swelling noted at site.  See MAR for details.  Band aid applied.  Patient stable during and after injection.  Vss with discharge and left in satisfactory condition with no s/s of distress noted. All follow ups as scheduled.   Andrew Adams

## 2024-05-28 NOTE — Telephone Encounter (Signed)
 Pt is scheduled to see Dr Catherine on 07-02-2024. NFN

## 2024-05-28 NOTE — Progress Notes (Signed)
 Overnight oximetry test on 12/18/2023 showed patient spent 3 hours 49 minutes with SpO2 load less than 88%.  Patient needs a in-lab CPAP titration study to establish supplemental oxygen need. I will place order, please notify patient.

## 2024-05-29 NOTE — Progress Notes (Unsigned)
 Patient ID: Andrew Adams, male   DOB: 1946/06/05, 78 y.o.   MRN: [id number]  Reason for Consult: No chief complaint on file.   Referred by Delsie Riggs, NP  Subjective:     HPI Andrew Adams is a 78 y.o. male who presents for evaluation HD access creation.  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     Comment:  2021 No date: GERD (gastroesophageal reflux disease) No date: Gout No date: Gouty arthritis No date: H/O small bowel obstruction No date: History of back surgery No date: Hx of adenomatous colonic polyps     Comment:  2021 No date: Hyperlipidemia No date: Hypertension No date: PE (pulmonary embolism) No date: Sleep apnea  Family History  Problem Relation Age of Onset   Hypertension Sister    Diabetes Sister    Diabetes Sister    Hypertension Mother    Diabetes Mother    Hypertension Father    Ulcers Father    Colon cancer Neg Hx    Esophageal cancer Neg Hx    Stomach cancer Neg Hx    Inflammatory bowel disease Neg Hx    Liver disease Neg Hx    Pancreatic cancer Neg Hx    Rectal cancer Neg Hx    Past Surgical History:  Procedure Laterality Date   ABDOMINAL SURGERY  2103   Smal bowel obstruction    ABDOMINAL SURGERY  1981   Perforated large intestine   back     Lower back   BIOPSY  08/28/2020   Procedure: BIOPSY;  Surgeon: Eartha Angelia Sieving, Adams;  [facility];  Service: Gastroenterology;;   BIOPSY  02/07/2022   Procedure: BIOPSY;  Surgeon: Wilhelmenia Aloha Raddle., Adams;  Location: [facility];  Service: Gastroenterology;;   CATARACT EXTRACTION W/PHACO Right 01/06/2020   Procedure: CATARACT EXTRACTION PHACO AND INTRAOCULAR LENS PLACEMENT (IOC) (CDE: 6.48);  Surgeon: Harrie Agent, Adams;  Location: [facility];  Service: Ophthalmology;  Laterality: Right;   CATARACT EXTRACTION W/PHACO Left 01/20/2020    Procedure: CATARACT EXTRACTION PHACO AND INTRAOCULAR LENS PLACEMENT (IOC);  Surgeon: Harrie Agent, Adams;  [facility]: [facility];  Service: Ophthalmology;  Laterality: Left;  CDE: 5.86   COLONOSCOPY N/A 05/09/2024   Procedure: COLONOSCOPY;  Surgeon: Wilhelmenia Aloha Raddle., Adams;  [facility];  Service: Gastroenterology;  Laterality: N/A;   COLONOSCOPY WITH PROPOFOL  N/A 08/28/2020   Procedure: COLONOSCOPY WITH PROPOFOL ;  Surgeon: Eartha Angelia Sieving, Adams;  [facility];  Service: Gastroenterology;  Laterality: N/A;  230   ENDOSCOPIC MUCOSAL RESECTION N/A 02/07/2022   Procedure: ENDOSCOPIC MUCOSAL RESECTION;  Surgeon: Wilhelmenia Aloha Raddle., Adams;  [facility];  Service: Gastroenterology;  Laterality: N/A;   ENDOSCOPIC MUCOSAL RESECTION  05/09/2024   Procedure: RESECTION, MUCOSAL LESION, GI TRACT, ENDOSCOPIC;  Surgeon: Wilhelmenia Aloha Raddle., Adams;  [facility];  Service: Gastroenterology;;   ESOPHAGOGASTRODUODENOSCOPY N/A 02/07/2022   Procedure: ESOPHAGOGASTRODUODENOSCOPY (EGD);  Surgeon: Wilhelmenia Aloha Raddle., Adams;  Location: [facility];  Service: Gastroenterology;  Laterality: N/A;   ESOPHAGOGASTRODUODENOSCOPY N/A 05/09/2024   Procedure: EGD (ESOPHAGOGASTRODUODENOSCOPY);  Surgeon: Wilhelmenia Aloha Raddle., Adams;  Location: [facility];  Service: Gastroenterology;  Laterality: N/A;   ESOPHAGOGASTRODUODENOSCOPY (EGD) WITH PROPOFOL  N/A 08/28/2020   Procedure: ESOPHAGOGASTRODUODENOSCOPY (EGD) WITH PROPOFOL ;  Surgeon: Eartha Angelia Sieving, Adams;  [facility];  Service: Gastroenterology;  Laterality: N/A;   ESOPHAGOGASTRODUODENOSCOPY (EGD) WITH PROPOFOL   N/A 12/14/2020   Procedure: ESOPHAGOGASTRODUODENOSCOPY (EGD) WITH PROPOFOL ;  Surgeon: Wilhelmenia Aloha Raddle., Adams;  Location: [facility];  Service: Gastroenterology;  Laterality: N/A;   EUS N/A 05/09/2024   Procedure: ULTRASOUND, UPPER GI TRACT, ENDOSCOPIC;  Surgeon: Wilhelmenia Aloha Raddle., Adams;   [facility];  Service: Gastroenterology;  Laterality: N/A;   GIVENS CAPSULE STUDY N/A 09/10/2020   Procedure: GIVENS CAPSULE STUDY;  Surgeon: Eartha Angelia Sieving, Adams;  [facility];  Service: Gastroenterology;  Laterality: N/A;  730   HEMOSTASIS CLIP PLACEMENT  12/14/2020   Procedure: HEMOSTASIS CLIP PLACEMENT;  Surgeon: Wilhelmenia Aloha Raddle., Adams;  Location: [facility];  Service: Gastroenterology;;   HEMOSTASIS CLIP PLACEMENT  02/07/2022   Procedure: HEMOSTASIS CLIP PLACEMENT;  Surgeon: Wilhelmenia Aloha Raddle., Adams;  Location: [facility];  Service: Gastroenterology;;   HEMOSTASIS CLIP PLACEMENT  05/09/2024   Procedure: CONTROL OF HEMORRHAGE, GI TRACT, ENDOSCOPIC, BY CLIPPING OR OVERSEWING;  Surgeon: Wilhelmenia Aloha Raddle., Adams;  Location: [facility];  Service: Gastroenterology;;   HEMOSTASIS CONTROL  12/14/2020   Procedure: HEMOSTASIS CONTROL;  Surgeon: Wilhelmenia Aloha Raddle., Adams;  Location: [facility];  Service: Gastroenterology;;   HERNIA REPAIR     incisional   HOT HEMOSTASIS N/A 02/07/2022   Procedure: HOT HEMOSTASIS (ARGON PLASMA COAGULATION/BICAP);  Surgeon: Wilhelmenia Aloha Raddle., Adams;  Location: [facility];  Service: Gastroenterology;  Laterality: N/A;   POLYPECTOMY  08/28/2020   Procedure: POLYPECTOMY;  Surgeon: Eartha Angelia Sieving, Adams;  [facility];  Service: Gastroenterology;;   POLYPECTOMY  12/14/2020   Procedure: POLYPECTOMY;  Surgeon: Wilhelmenia Aloha Raddle., Adams;  Location: [facility];  Service: Gastroenterology;;   MATIAS  12/14/2020   Procedure: MATIAS;  Surgeon: Wilhelmenia Aloha Raddle., Adams;  Location: [facility];  Service: Gastroenterology;;   ROBLEY MEYER INJECTION  02/07/2022   Procedure: SUBMUCOSAL LIFTING INJECTION;  Surgeon: Wilhelmenia Aloha Raddle., Adams;  [facility];  Service: Gastroenterology;;   SUBMUCOSAL TATTOO INJECTION  12/14/2020   Procedure: SUBMUCOSAL TATTOO INJECTION;  Surgeon:  Wilhelmenia Aloha Raddle., Adams;  [facility];  Service: Gastroenterology;;    Short Social History:  Social History   Tobacco Use   Smoking status: Former    Current packs/day: 0.00    Average packs/day: 1 pack/day for 38.0 years (38.0 ttl pk-yrs)    Types: Cigarettes    Start date: 02/06/1957    Quit date: 02/07/1995    Years since quitting: 29.3   Smokeless tobacco: Never  Substance Use Topics   Alcohol  use: Yes    Alcohol /week: 3.0 standard drinks of alcohol     Types: 1 Cans of beer, 2 Shots of liquor per week    Comment: occasional     No Known Allergies  Current Outpatient Medications  Medication Sig Dispense Refill   albuterol  (VENTOLIN  HFA) 108 (90 Base) MCG/ACT inhaler Inhale 2 puffs into the lungs every 4 (four) hours as needed.     allopurinol  (ZYLOPRIM ) 100 MG tablet Take 1 tablet (100 mg total) by mouth daily. 90 tablet 1   amLODipine  (NORVASC ) 5 MG tablet Take 10 mg by mouth daily.     apixaban  (ELIQUIS ) 5 MG TABS tablet Take 5 mg by mouth 2 (two) times daily.     atorvastatin  (LIPITOR) 40 MG tablet Take 1 tablet (40 mg total) by mouth daily. 90 tablet 1   bisoprolol  (ZEBETA ) 5 MG tablet Take 1 tablet (5 mg total) by mouth daily. 90 tablet 3   epoetin  alfa (EPOGEN ) 3000 UNIT/ML injection Inject into the  skin.     epoetin  alfa-epbx (RETACRIT ) 3000 UNIT/ML injection Inject 3,000 Units into the vein every 14 (fourteen) days.     FEROSUL 325 (65 Fe) MG tablet TAKE ONE TABLET BY MOUTH EVERY MORNING WITH BREAKFAST 90 tablet 3   furosemide  (LASIX ) 40 MG tablet Take 40 mg by mouth 2 (two) times daily.     hydrALAZINE  (APRESOLINE ) 25 MG tablet Take 1 tablet (25 mg total) by mouth every 6 (six) hours. 90 tablet 1   ipratropium-albuterol  (DUONEB) 0.5-2.5 (3) MG/3ML SOLN Take 3 mLs by nebulization every 6 (six) hours as needed. 360 mL 5   Multiple Vitamin (MULTIVITAMIN WITH MINERALS) TABS tablet Take 1 tablet by mouth daily.     Na Sulfate-K Sulfate-Mg Sulfate concentrate  (SUPREP BOWEL PREP KIT) 17.5-3.13-1.6 GM/177ML SOLN Take 1 kit (354 mLs total) by mouth as directed. For colonoscopy prep 354 mL 0   pantoprazole  (PROTONIX ) 40 MG tablet Take 1 tablet (40 mg total) by mouth 2 (two) times daily before a meal. 60 tablet 12   spironolactone (ALDACTONE) 25 MG tablet Take 25 mg by mouth daily.     TRULICITY 0.75 MG/0.5ML SOPN Inject 0.75 mg into the skin every Friday.     No current facility-administered medications for this visit.    REVIEW OF SYSTEMS All other systems were reviewed and are negative    Objective:  Objective   There were no vitals filed for this visit. There is no height or weight on file to calculate BMI.  Physical Exam General: no acute distress Cardiac: hemodynamically stable Pulm: normal work of breathing Neuro: alert, no focal deficit Extremities: No edema cyanosis or wounds Vascular:   Right: palpable brachial, radial  Left: palpable brachial, radial   Data: UE arterial duplex Right Pre-Dialysis Findings:  +-----------------------------+----------+-------------------+--------+----  ----+  Location                    PSV (cm/s)Intralum. Diam.     WaveformComments                                        (cm)                                  +-----------------------------+----------+-------------------+--------+----  ----+  Radial artery distal upper   58        0.5                                   arm                                                                          +-----------------------------+----------+-------------------+--------+----  ----+  Ulnar artery distal upper arm56        0.6                                   +-----------------------------+----------+-------------------+--------+----  ----+  Radial Art at Wrist  46        0.50                                  +-----------------------------+----------+-------------------+--------+----  ----+  Ulnar  Art at Wrist           48        0.20                                  +-----------------------------+----------+-------------------+--------+----  ----+  Left Pre-Dialysis Findings:  +-----------------------+----------+--------------------+--------+--------+   Location              PSV (cm/s)Intralum. Diam.  (cm)WaveformComments  +-----------------------+----------+--------------------+--------+--------+   Brachial Antecub. fossa59        0.70                                   +-----------------------+----------+--------------------+--------+--------+   Radial Art at Wrist    51        0.50                                   +-----------------------+----------+--------------------+--------+--------+   Ulnar Art at Wrist     43        0.20                                   +-----------------------+----------+--------------------+--------+--------+    Vein mapping +-----------------+-------------+----------+--------------+  Right Cephalic   Diameter (cm)Depth (cm)   Findings     +-----------------+-------------+----------+--------------+  Shoulder            0.11                               +-----------------+-------------+----------+--------------+  Prox upper arm       0.12                               +-----------------+-------------+----------+--------------+  Mid upper arm        0.07                               +-----------------+-------------+----------+--------------+  Dist upper arm                          not visualized  +-----------------+-------------+----------+--------------+  Antecubital fossa                       not visualized  +-----------------+-------------+----------+--------------+  Prox forearm                            not visualized  +-----------------+-------------+----------+--------------+  Mid forearm                             not visualized   +-----------------+-------------+----------+--------------+  Wrist               0.10                               +-----------------+-------------+----------+--------------+   +-----------------+-------------+----------+--------------+  Right Basilic    Diameter (cm)Depth (cm)   Findings     +-----------------+-------------+----------+--------------+  Shoulder            0.38                               +-----------------+-------------+----------+--------------+  Prox upper arm       0.21                               +-----------------+-------------+----------+--------------+  Mid upper arm        0.23                               +-----------------+-------------+----------+--------------+  Dist upper arm       0.18                               +-----------------+-------------+----------+--------------+  Antecubital fossa    0.17                               +-----------------+-------------+----------+--------------+  Prox forearm         0.10                               +-----------------+-------------+----------+--------------+  Mid forearm                             not visualized  +-----------------+-------------+----------+--------------+  Wrist               0.10                               +-----------------+-------------+----------+--------------+   +-----------------+-------------+----------+--------------+  Left Cephalic    Diameter (cm)Depth (cm)   Findings     +-----------------+-------------+----------+--------------+  Shoulder            0.12                               +-----------------+-------------+----------+--------------+  Prox upper arm       0.07                               +-----------------+-------------+----------+--------------+  Mid upper arm        0.07                               +-----------------+-------------+----------+--------------+  Dist upper arm                           not visualized  +-----------------+-------------+----------+--------------+  Antecubital fossa                       not visualized  +-----------------+-------------+----------+--------------+  Prox forearm                            not visualized  +-----------------+-------------+----------+--------------+  Mid forearm                             not visualized  +-----------------+-------------+----------+--------------+  Wrist                                  not visualized  +-----------------+-------------+----------+--------------+   +-----------------+-------------+----------+--------------+  Left Basilic     Diameter (cm)Depth (cm)   Findings     +-----------------+-------------+----------+--------------+  Shoulder            0.66                               +-----------------+-------------+----------+--------------+  Prox upper arm       0.45                               +-----------------+-------------+----------+--------------+  Mid upper arm        0.38                               +-----------------+-------------+----------+--------------+  Dist upper arm       0.27                               +-----------------+-------------+----------+--------------+  Antecubital fossa    0.23                               +-----------------+-------------+----------+--------------+  Prox forearm         0.11                               +-----------------+-------------+----------+--------------+  Mid forearm                             not visualized  +-----------------+-------------+----------+--------------+  Wrist                                  not visualized  +-----------------+-------------+----------+--------------+   Note from nephrologist reviewed       Assessment/Plan:     Andrew Adams is a 78 y.o. 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   Dominant hand: right Previous access surgeries: none Previous catheters: none Other arm surgeries/injuries: none  The vein mapping suggests there is not a suitable superficial vein and we discussed that they are a candidate for AVG  He is also referred to [facility] for PD catheter placement.  He is seeing this physician on the 13th.  He has had multiple abdominal surgeries and I explained he may not be a candidate but he will call back for another appointment to discuss HD access creation if this surgeon in [place] does not believe peritoneal dialysis is an option for him.     Andrew Adams [facility]

## 2024-05-30 ENCOUNTER — Ambulatory Visit: Payer: Self-pay | Admitting: Adult Health

## 2024-05-30 ENCOUNTER — Ambulatory Visit (HOSPITAL_COMMUNITY)
Admission: RE | Admit: 2024-05-30 | Discharge: 2024-05-30 | Disposition: A | Discharge status: Home / Self Care | Source: Ambulatory Visit | Attending: Nephrology | Admitting: Nephrology

## 2024-05-30 DIAGNOSIS — N184 Chronic kidney disease, stage 4 (severe): Secondary | ICD-10-CM | POA: Diagnosis not present

## 2024-05-30 DIAGNOSIS — N200 Calculus of kidney: Secondary | ICD-10-CM | POA: Diagnosis not present

## 2024-05-30 DIAGNOSIS — E1122 Type 2 diabetes mellitus with diabetic chronic kidney disease: Secondary | ICD-10-CM | POA: Diagnosis not present

## 2024-05-30 DIAGNOSIS — R809 Proteinuria, unspecified: Secondary | ICD-10-CM | POA: Insufficient documentation

## 2024-05-30 DIAGNOSIS — E1129 Type 2 diabetes mellitus with other diabetic kidney complication: Secondary | ICD-10-CM | POA: Insufficient documentation

## 2024-05-30 DIAGNOSIS — N281 Cyst of kidney, acquired: Secondary | ICD-10-CM | POA: Diagnosis not present

## 2024-05-31 ENCOUNTER — Ambulatory Visit (HOSPITAL_COMMUNITY)
Admission: RE | Admit: 2024-05-31 | Discharge: 2024-05-31 | Disposition: A | Discharge status: Home / Self Care | Source: Ambulatory Visit | Attending: Vascular Surgery | Admitting: Vascular Surgery

## 2024-05-31 ENCOUNTER — Encounter: Payer: Self-pay | Admitting: Vascular Surgery

## 2024-05-31 ENCOUNTER — Ambulatory Visit (HOSPITAL_BASED_OUTPATIENT_CLINIC_OR_DEPARTMENT_OTHER)
Admission: RE | Admit: 2024-05-31 | Discharge: 2024-05-31 | Disposition: A | Discharge status: Home / Self Care | Source: Ambulatory Visit | Attending: Vascular Surgery | Admitting: Vascular Surgery

## 2024-05-31 ENCOUNTER — Ambulatory Visit (HOSPITAL_COMMUNITY): Admitting: Vascular Surgery

## 2024-05-31 VITALS — BP 132/77 | HR 82 | Temp 98.0°F | Ht 69.0 in | Wt 196.0 lb

## 2024-05-31 DIAGNOSIS — N184 Chronic kidney disease, stage 4 (severe): Secondary | ICD-10-CM

## 2024-05-31 DIAGNOSIS — N185 Chronic kidney disease, stage 5: Secondary | ICD-10-CM

## 2024-06-03 NOTE — Progress Notes (Signed)
 ATC x1.  LVM to return call.  No mychart set up.

## 2024-06-06 DIAGNOSIS — N185 Chronic kidney disease, stage 5: Secondary | ICD-10-CM | POA: Diagnosis not present

## 2024-06-06 DIAGNOSIS — E1129 Type 2 diabetes mellitus with other diabetic kidney complication: Secondary | ICD-10-CM | POA: Diagnosis not present

## 2024-06-06 DIAGNOSIS — G4733 Obstructive sleep apnea (adult) (pediatric): Secondary | ICD-10-CM | POA: Diagnosis not present

## 2024-06-06 DIAGNOSIS — R809 Proteinuria, unspecified: Secondary | ICD-10-CM | POA: Diagnosis not present

## 2024-06-11 ENCOUNTER — Inpatient Hospital Stay: Attending: Hematology

## 2024-06-11 ENCOUNTER — Inpatient Hospital Stay

## 2024-06-11 VITALS — BP 131/78 | HR 81 | Temp 97.8°F | Resp 18

## 2024-06-11 DIAGNOSIS — D509 Iron deficiency anemia, unspecified: Secondary | ICD-10-CM

## 2024-06-11 DIAGNOSIS — Z79899 Other long term (current) drug therapy: Secondary | ICD-10-CM | POA: Diagnosis not present

## 2024-06-11 DIAGNOSIS — I129 Hypertensive chronic kidney disease with stage 1 through stage 4 chronic kidney disease, or unspecified chronic kidney disease: Secondary | ICD-10-CM | POA: Insufficient documentation

## 2024-06-11 DIAGNOSIS — D631 Anemia in chronic kidney disease: Secondary | ICD-10-CM | POA: Diagnosis not present

## 2024-06-11 DIAGNOSIS — N184 Chronic kidney disease, stage 4 (severe): Secondary | ICD-10-CM | POA: Diagnosis not present

## 2024-06-11 LAB — CBC
HCT: 29 % — ABNORMAL LOW (ref 39.0–52.0)
Hemoglobin: 9.9 g/dL — ABNORMAL LOW (ref 13.0–17.0)
MCH: 29.3 pg (ref 26.0–34.0)
MCHC: 34.1 g/dL (ref 30.0–36.0)
MCV: 85.8 fL (ref 80.0–100.0)
Platelets: 156 K/uL (ref 150–400)
RBC: 3.38 MIL/uL — ABNORMAL LOW (ref 4.22–5.81)
RDW: 14.2 % (ref 11.5–15.5)
WBC: 5.1 K/uL (ref 4.0–10.5)
nRBC: 0 % (ref 0.0–0.2)

## 2024-06-11 MED ORDER — EPOETIN ALFA-EPBX 10000 UNIT/ML IJ SOLN
10000.0000 [IU] | Freq: Once | INTRAMUSCULAR | Status: AC
  Administered 2024-06-11 (×2): 10000 [IU] via SUBCUTANEOUS
  Filled 2024-06-11: qty 1

## 2024-06-11 NOTE — Progress Notes (Signed)
 Hgb 9.9 and blood pressure stable. Patient tolerated Retacrit  injection with no complaints voiced.  Site clean and dry with no bruising or swelling noted at site.  See MAR for details.  Band aid applied.  Patient stable during and after injection.  Vss with discharge and left in satisfactory condition with no s/s of distress noted. All follow ups as scheduled.  Angella Montas

## 2024-06-11 NOTE — Patient Instructions (Signed)

## 2024-06-12 ENCOUNTER — Telehealth: Payer: Self-pay

## 2024-06-12 DIAGNOSIS — E119 Type 2 diabetes mellitus without complications: Secondary | ICD-10-CM | POA: Diagnosis not present

## 2024-06-12 DIAGNOSIS — Z86711 Personal history of pulmonary embolism: Secondary | ICD-10-CM | POA: Diagnosis not present

## 2024-06-12 DIAGNOSIS — J449 Chronic obstructive pulmonary disease, unspecified: Secondary | ICD-10-CM | POA: Diagnosis not present

## 2024-06-12 DIAGNOSIS — D631 Anemia in chronic kidney disease: Secondary | ICD-10-CM | POA: Diagnosis not present

## 2024-06-12 DIAGNOSIS — I10 Essential (primary) hypertension: Secondary | ICD-10-CM | POA: Diagnosis not present

## 2024-06-12 DIAGNOSIS — N189 Chronic kidney disease, unspecified: Secondary | ICD-10-CM | POA: Diagnosis not present

## 2024-06-12 NOTE — Telephone Encounter (Signed)
 Copied from CRM [id number]. Topic: Clinical - Lab/Test Results >> Jun 03, 2024  3:05 PM Rozanna MATSU wrote: Reason for CRM: PT RETURNIING CALL TO CLINIC I DID ADVISE HIM OF THE NOTE ON THE CT FOR HIS RESULTS AND ADVISED WHAT TAMMY PUT IN HER NOTE  See phone note

## 2024-06-19 ENCOUNTER — Other Ambulatory Visit (HOSPITAL_COMMUNITY)
Admission: RE | Admit: 2024-06-19 | Discharge: 2024-06-19 | Disposition: A | Discharge status: Home / Self Care | Source: Ambulatory Visit | Attending: Nephrology | Admitting: Nephrology

## 2024-06-19 DIAGNOSIS — N185 Chronic kidney disease, stage 5: Secondary | ICD-10-CM | POA: Diagnosis not present

## 2024-06-19 DIAGNOSIS — D631 Anemia in chronic kidney disease: Secondary | ICD-10-CM | POA: Diagnosis not present

## 2024-06-19 DIAGNOSIS — R809 Proteinuria, unspecified: Secondary | ICD-10-CM | POA: Diagnosis not present

## 2024-06-19 DIAGNOSIS — E211 Secondary hyperparathyroidism, not elsewhere classified: Secondary | ICD-10-CM | POA: Insufficient documentation

## 2024-06-19 LAB — CBC
HCT: 27.8 % — ABNORMAL LOW (ref 39.0–52.0)
Hemoglobin: 9.6 g/dL — ABNORMAL LOW (ref 13.0–17.0)
MCH: 30.1 pg (ref 26.0–34.0)
MCHC: 34.5 g/dL (ref 30.0–36.0)
MCV: 87.1 fL (ref 80.0–100.0)
Platelets: 192 K/uL (ref 150–400)
RBC: 3.19 MIL/uL — ABNORMAL LOW (ref 4.22–5.81)
RDW: 14.4 % (ref 11.5–15.5)
WBC: 5.4 K/uL (ref 4.0–10.5)
nRBC: 0 % (ref 0.0–0.2)

## 2024-06-19 LAB — RENAL FUNCTION PANEL
Albumin: 3.8 g/dL (ref 3.5–5.0)
Anion gap: 15 (ref 5–15)
BUN: 87 mg/dL — ABNORMAL HIGH (ref 8–23)
CO2: 22 mmol/L (ref 22–32)
Calcium: 9.2 mg/dL (ref 8.9–10.3)
Chloride: 96 mmol/L — ABNORMAL LOW (ref 98–111)
Creatinine, Ser: 3.94 mg/dL — ABNORMAL HIGH (ref 0.61–1.24)
GFR, Estimated: 15 mL/min — ABNORMAL LOW
Glucose, Bld: 124 mg/dL — ABNORMAL HIGH (ref 70–99)
Phosphorus: 3.8 mg/dL (ref 2.5–4.6)
Potassium: 4.3 mmol/L (ref 3.5–5.1)
Sodium: 133 mmol/L — ABNORMAL LOW (ref 135–145)

## 2024-06-19 LAB — HEPATITIS B SURFACE ANTIGEN: Hepatitis B Surface Ag: NONREACTIVE

## 2024-06-19 LAB — HEPATITIS C ANTIBODY: HCV Ab: NONREACTIVE

## 2024-06-19 LAB — HEPATITIS B CORE ANTIBODY, IGM: Hep B C IgM: NONREACTIVE

## 2024-06-19 LAB — HEPATITIS B SURFACE ANTIBODY,QUALITATIVE: Hep B S Ab: NONREACTIVE

## 2024-06-20 LAB — HCV INTERPRETATION

## 2024-06-20 LAB — HCV AB W REFLEX TO QUANT PCR: HCV Ab: NONREACTIVE

## 2024-06-20 LAB — HEPATITIS B CORE ANTIBODY, TOTAL: HEP B CORE AB: POSITIVE — AB

## 2024-06-20 LAB — PARATHYROID HORMONE, INTACT (NO CA): PTH: 137 pg/mL — ABNORMAL HIGH (ref 15–65)

## 2024-06-22 DIAGNOSIS — G4733 Obstructive sleep apnea (adult) (pediatric): Secondary | ICD-10-CM | POA: Diagnosis not present

## 2024-06-22 DIAGNOSIS — E1122 Type 2 diabetes mellitus with diabetic chronic kidney disease: Secondary | ICD-10-CM | POA: Diagnosis not present

## 2024-06-22 DIAGNOSIS — E1129 Type 2 diabetes mellitus with other diabetic kidney complication: Secondary | ICD-10-CM | POA: Diagnosis not present

## 2024-06-22 DIAGNOSIS — R809 Proteinuria, unspecified: Secondary | ICD-10-CM | POA: Diagnosis not present

## 2024-06-22 LAB — QUANTIFERON-TB GOLD PLUS: QuantiFERON-TB Gold Plus: NEGATIVE

## 2024-06-22 LAB — QUANTIFERON-TB GOLD PLUS (RQFGPL)
QuantiFERON Mitogen Value: 3.2 [IU]/mL
QuantiFERON Nil Value: 0.05 [IU]/mL
QuantiFERON TB1 Ag Value: 0.05 [IU]/mL
QuantiFERON TB2 Ag Value: 0.05 [IU]/mL

## 2024-06-25 ENCOUNTER — Inpatient Hospital Stay

## 2024-06-25 VITALS — BP 131/73 | HR 85 | Temp 97.1°F | Resp 20

## 2024-06-25 DIAGNOSIS — I129 Hypertensive chronic kidney disease with stage 1 through stage 4 chronic kidney disease, or unspecified chronic kidney disease: Secondary | ICD-10-CM | POA: Diagnosis not present

## 2024-06-25 DIAGNOSIS — D631 Anemia in chronic kidney disease: Secondary | ICD-10-CM

## 2024-06-25 DIAGNOSIS — Z79899 Other long term (current) drug therapy: Secondary | ICD-10-CM | POA: Diagnosis not present

## 2024-06-25 DIAGNOSIS — N184 Chronic kidney disease, stage 4 (severe): Secondary | ICD-10-CM | POA: Diagnosis not present

## 2024-06-25 DIAGNOSIS — D509 Iron deficiency anemia, unspecified: Secondary | ICD-10-CM

## 2024-06-25 LAB — CBC
HCT: 30.2 % — ABNORMAL LOW (ref 39.0–52.0)
Hemoglobin: 10.2 g/dL — ABNORMAL LOW (ref 13.0–17.0)
MCH: 29.1 pg (ref 26.0–34.0)
MCHC: 33.8 g/dL (ref 30.0–36.0)
MCV: 86 fL (ref 80.0–100.0)
Platelets: 170 K/uL (ref 150–400)
RBC: 3.51 MIL/uL — ABNORMAL LOW (ref 4.22–5.81)
RDW: 14 % (ref 11.5–15.5)
WBC: 4.8 K/uL (ref 4.0–10.5)
nRBC: 0 % (ref 0.0–0.2)

## 2024-06-25 MED ORDER — EPOETIN ALFA-EPBX 10000 UNIT/ML IJ SOLN
10000.0000 [IU] | Freq: Once | INTRAMUSCULAR | Status: AC
  Administered 2024-06-25: 10000 [IU] via SUBCUTANEOUS
  Filled 2024-06-25: qty 1

## 2024-06-25 NOTE — Patient Instructions (Signed)
 [facility] - A DEPT OF [facility]  Discharge Instructions: Thank you for choosing [facility] to provide your oncology and hematology care.  If you have a lab appointment with the Cancer Center - please note that after April 8th, 2024, all labs will be drawn in the cancer center.  You do not have to check in or register with the main entrance as you have in the past but will complete your check-in in the cancer center.  Wear comfortable clothing and clothing appropriate for easy access to any Portacath or PICC line.   We strive to give you quality time with your provider. You may need to reschedule your appointment if you arrive late (15 or more minutes).  Arriving late affects you and other patients whose appointments are after yours.  Also, if you miss three or more appointments without notifying the office, you may be dismissed from the clinic at the provider's discretion.      For prescription refill requests, have your pharmacy contact our office and allow 72 hours for refills to be completed.    Today you received the following injection Retacrit   To help prevent nausea and vomiting after your treatment, we encourage you to take your nausea medication as directed.  BELOW ARE SYMPTOMS THAT SHOULD BE REPORTED IMMEDIATELY: *FEVER GREATER THAN 100.4 F (38 C) OR HIGHER *CHILLS OR SWEATING *NAUSEA AND VOMITING THAT IS NOT CONTROLLED WITH YOUR NAUSEA MEDICATION *UNUSUAL SHORTNESS OF BREATH *UNUSUAL BRUISING OR BLEEDING *URINARY PROBLEMS (pain or burning when urinating, or frequent urination) *BOWEL PROBLEMS (unusual diarrhea, constipation, pain near the anus) TENDERNESS IN MOUTH AND THROAT WITH OR WITHOUT PRESENCE OF ULCERS (sore throat, sores in mouth, or a toothache) UNUSUAL RASH, SWELLING OR PAIN  UNUSUAL VAGINAL DISCHARGE OR ITCHING   Items with * indicate a potential emergency and should be followed up as soon as possible or go to the  Emergency Department if any problems should occur.  Please show the CHEMOTHERAPY ALERT CARD or IMMUNOTHERAPY ALERT CARD at check-in to the Emergency Department and triage nurse.  Should you have questions after your visit or need to cancel or reschedule your appointment, please contact [facility] [place] - A DEPT OF [facility] [phone]  and follow the prompts.  Office hours are 8:00 a.m. to 4:30 p.m. Monday - Friday. Please note that voicemails left after 4:00 p.m. may not be returned until the following business day.  We are closed weekends and major holidays. You have access to a nurse at all times for urgent questions. Please call the main number to the clinic [phone] and follow the prompts.  For any non-urgent questions, you may also contact your provider using MyChart. We now offer e-Visits for anyone 61 and older to request care online for non-urgent symptoms. For details visit [email or web address].   Also download the MyChart app! Go to the app store, search "MyChart", open the app, select [place], and log in with your MyChart username and password.

## 2024-06-25 NOTE — Progress Notes (Signed)
 Retacrit injection given per orders. Patient tolerated it well without problems. Vitals stable and discharged home from clinic ambulatory. Follow up as scheduled.

## 2024-06-26 ENCOUNTER — Telehealth: Payer: Self-pay | Admitting: Primary Care

## 2024-06-26 DIAGNOSIS — G4733 Obstructive sleep apnea (adult) (pediatric): Secondary | ICD-10-CM

## 2024-06-26 NOTE — Telephone Encounter (Signed)
 ONO in February showed patient spent 3 hours 49 mins with SpO2 <88% while on CPAP. He under went CPAP titration study in April, recommended pressure 11cm h2o with EPR 2. No supplemental oxygen   I will make sure pressure changes have been ordered Please schedule FU in 4-8 weeks

## 2024-06-27 NOTE — Telephone Encounter (Signed)
 ATC X1. LMTCB

## 2024-06-28 ENCOUNTER — Telehealth: Payer: Self-pay

## 2024-06-28 NOTE — Telephone Encounter (Signed)
 ATC X2. LMTCB

## 2024-06-28 NOTE — Telephone Encounter (Signed)
 Copied from CRM #[id number]. Topic: Clinical - Lab/Test Results >> Jun 27, 2024  4:50 PM Shona S wrote: Reason for CRM: please call patient with results. For ONO   Tried to reach out to PT to go over ONO results - VM/LM this has been the 2nd attempt that someone has tried to reach out to patient    (ONO in February showed patient spent 3 hours 49 mins with SpO2 <88% while on CPAP. He under went CPAP titration study in April, recommended pressure 11cm h2o with EPR 2. No supplemental oxygen    I will make sure pressure changes have been ordered Please schedule FU in 4-8 weeks )   -NFN

## 2024-07-02 ENCOUNTER — Ambulatory Visit: Admitting: Pulmonary Disease

## 2024-07-02 ENCOUNTER — Encounter: Payer: Self-pay | Admitting: Pulmonary Disease

## 2024-07-02 VITALS — BP 106/50 | HR 87 | Ht 69.0 in | Wt 201.2 lb

## 2024-07-02 DIAGNOSIS — G4734 Idiopathic sleep related nonobstructive alveolar hypoventilation: Secondary | ICD-10-CM

## 2024-07-02 DIAGNOSIS — R918 Other nonspecific abnormal finding of lung field: Secondary | ICD-10-CM

## 2024-07-02 DIAGNOSIS — G4733 Obstructive sleep apnea (adult) (pediatric): Secondary | ICD-10-CM

## 2024-07-02 NOTE — Progress Notes (Signed)
 Established Patient Pulmonology Office Visit   Subjective:  Patient ID: Andrew Adams, male    DOB: [birth year]  MRN: [id number]  CC:  Chief Complaint  Patient presents with   Establish Care   Sleep Apnea    Pt wants to d/c o2 but needed a sleep appt. Before its approved     HPI Andrew Adams is an 78 y/o M with a PMH significant for OSA who is here for follow up.  Underwent PSG 01/2024 and CPAP increased to 11 cm H2O. The sleep study was done off oxygen  therapy. His lowest SPO2 was 88% off oxygen . He is now using 2 L O2 by [place] oxygen  bleed with CPAP which he has been trying to get somebody to stop since sleep study. This would save him 30 dollars a month given limited social security income. The patient notes that he uses CPAP every night at least 4 hours and has been on it at least 10 years. His download shows excellent compliance with AHI of 1.5. His DME company is [employer].  CPAP Titration Results 01/2024: Comment: CPAP titration to 11 cwp, EPR 2, residual AHI (4%) 0/hr, minimum O2 saturation 92%.   Diagnosis: OSA   Recommendations: Suggest autopap 5-15 or fixed CPAP 11. Patient wore a medium [id number] mask with heated humidification.  ROS    Current Outpatient Medications:    albuterol  (VENTOLIN  HFA) 108 (90 Base) MCG/ACT inhaler, Inhale 2 puffs into the lungs every 4 (four) hours as needed., Disp: , Rfl:    allopurinol  (ZYLOPRIM ) 100 MG tablet, Take 1 tablet (100 mg total) by mouth daily., Disp: 90 tablet, Rfl: 1   amLODipine  (NORVASC ) 5 MG tablet, Take 10 mg by mouth daily., Disp: , Rfl:    apixaban  (ELIQUIS ) 5 MG TABS tablet, Take 5 mg by mouth 2 (two) times daily., Disp: , Rfl:    atorvastatin  (LIPITOR) 40 MG tablet, Take 1 tablet (40 mg total) by mouth daily., Disp: 90 tablet, Rfl: 1   bisoprolol  (ZEBETA ) 5 MG tablet, Take 1 tablet (5 mg total) by mouth daily., Disp: 90 tablet, Rfl: 3   epoetin  alfa (EPOGEN ) 3000 UNIT/ML injection, Inject into the skin., Disp: , Rfl:     epoetin  alfa-epbx (RETACRIT ) 3000 UNIT/ML injection, Inject 3,000 Units into the vein every 14 (fourteen) days., Disp: , Rfl:    FEROSUL 325 (65 Fe) MG tablet, TAKE ONE TABLET BY MOUTH EVERY MORNING WITH BREAKFAST, Disp: 90 tablet, Rfl: 3   furosemide  (LASIX ) 40 MG tablet, Take 40 mg by mouth 2 (two) times daily., Disp: , Rfl:    hydrALAZINE  (APRESOLINE ) 25 MG tablet, Take 1 tablet (25 mg total) by mouth every 6 (six) hours., Disp: 90 tablet, Rfl: 1   ipratropium-albuterol  (DUONEB) 0.5-2.5 (3) MG/3ML SOLN, Take 3 mLs by nebulization every 6 (six) hours as needed., Disp: 360 mL, Rfl: 5   Multiple Vitamin (MULTIVITAMIN WITH MINERALS) TABS tablet, Take 1 tablet by mouth daily., Disp:  , Rfl:    Na Sulfate-K Sulfate-Mg Sulfate concentrate (SUPREP BOWEL PREP KIT) 17.5-3.13-1.6 GM/177ML SOLN, Take 1 kit (354 mLs total) by mouth as directed. For colonoscopy prep, Disp: 354 mL, Rfl: 0   pantoprazole  (PROTONIX ) 40 MG tablet, Take 1 tablet (40 mg total) by mouth 2 (two) times daily before a meal., Disp: 60 tablet, Rfl: 12   spironolactone (ALDACTONE) 25 MG tablet, Take 25 mg by mouth daily., Disp: , Rfl:    TRULICITY 0.75 MG/0.5ML SOPN, Inject 0.75 mg into the skin every  Friday., Disp: , Rfl:       Objective:  BP (!) 106/50   Pulse 87   Ht 5' 9 (1.753 m)   Wt 201 lb 3.2 oz (91.3 kg)   SpO2 94% Comment: ra  BMI 29.71 kg/m  Wt Readings from Last 3 Encounters:  07/02/24 201 lb 3.2 oz (91.3 kg)  05/31/24 196 lb (88.9 kg)  05/09/24 210 lb (95.3 kg)   BMI Readings from Last 3 Encounters:  07/02/24 29.71 kg/m  05/31/24 28.94 kg/m  05/09/24 31.01 kg/m   SpO2 Readings from Last 3 Encounters:  07/02/24 94%  06/25/24 100%  06/11/24 98%    Physical Exam General: NAD, alert, WD, WN Eyes: PERRL, no scleral icterus ENMT: oropharynx clear, good dentition, no oral lesions, mallampati score  IV Skin: warm, intact, no rashes Neck: JVD flat, ROM and lymph node assessment normal CV: RRR, no MRG, nl  S1 and S2, no peripheral edema Resp: clear to auscultation bilaterally, no wheezes, rales, or rhonchi, normal effort, no clubbing/cyanosis Abdom: Normoactive bowel sounds, soft, nontender, nondistended, no hepatosplenomegaly Ext: edema Neuro: Awake alert oriented to person place time and situation   Diagnostic Review:  Last CBC  PFT 2021: moderate restrictive defect with severe reduction in diffusion capacity.   CT Chest 05/22/2024: IMPRESSION: 1. Stable appearance of bilateral lung nodules. The largest nodule is located within the posterior and lateral left lower lobe and measures 1.6 x 0.8 x 0.7 cm, which is unchanged in size from 02/16/2022. 2. Coronary artery calcifications. 3. Cholelithiasis. 4. Aortic Atherosclerosis (ICD10-I70.0) and Emphysema (ICD10-J43.9).    Assessment & Plan:   Assessment & Plan OSA (obstructive sleep apnea) Patient with established diagnosis of OSA. Recent CPAP titration shows need for 11 cm CPAP which he is on. He did not have desaturation events off oxygen  therapy. The patient would like to discontinue oxygen  which I agree with. I submitted order to DME company to discontinue oxygen  bleed. For now, would continue with CPAP 11 cm H2O without oxygen . He can follow up in a year. Nocturnal hypoxemia Plan as above Multiple lung nodules Recently had CT chest that showed LLL SPN that is unchanged in size compared to prior imaging, at least since 2021. There's no indication for continued follow up given stability.  Orders Placed This Encounter  Procedures   For home use only DME oxygen    Ambulatory Referral for DME   I spent 33 minutes reviewing patient's chart including prior consultant notes, imaging, and PFTs as well as face-to-face with the patient, over half in discussion of the diagnosis and the importance of compliance with the treatment plan.  Return in about 1 year (around 07/02/2025).   Seynabou Fults, MD

## 2024-07-02 NOTE — Assessment & Plan Note (Signed)
 Patient with established diagnosis of OSA. Recent CPAP titration shows need for 11 cm CPAP which he is on. He did not have desaturation events off oxygen  therapy. The patient would like to discontinue oxygen  which I agree with. I submitted order to DME company to discontinue oxygen  bleed. For now, would continue with CPAP 11 cm H2O without oxygen . He can follow up in a year.

## 2024-07-03 ENCOUNTER — Telehealth (HOSPITAL_BASED_OUTPATIENT_CLINIC_OR_DEPARTMENT_OTHER): Payer: Self-pay

## 2024-07-03 NOTE — Telephone Encounter (Signed)
 I called them but no answer. The patient had a sleep study off oxygen  and did not desaturate. Can discontinue oxygen  bleed. He also walked into the clinic and his SPO2 > 90%. The patient is not using the oxygen  and would like to discontinue it.

## 2024-07-03 NOTE — Telephone Encounter (Signed)
 I called and spoke with [employer] to confirm the d/c o2 order is okay, [employer] stated that they are going to push ordedr through but need Dr Brett signature on the order to dc o2.

## 2024-07-03 NOTE — Telephone Encounter (Signed)
 Copied from CRM [id number]. Topic: Clinical - Prescription Issue >> Jul 03, 2024  9:56 AM Corean SAUNDERS wrote: Reason for CRM: Jerel from [facility] [employer] is requesting clarification on the discontinuation of oxygen  order sent by Dr. Catherine as the order states discontinuation of oxygen  for cpap OSA use but Jerel states their records show the patient needing oxygen  for COPD, and is not sure if the patient is to stop oxygen  all together even at night?  Please call Jerel back [phone] option  1

## 2024-07-04 NOTE — Telephone Encounter (Signed)
 ATC X3. LMTCB. Pt does not have Mychart so I am sending pt a letter in the mail for him to give us  a call. NFN

## 2024-07-09 ENCOUNTER — Inpatient Hospital Stay: Attending: Hematology

## 2024-07-09 ENCOUNTER — Inpatient Hospital Stay

## 2024-07-09 VITALS — BP 139/86 | HR 83 | Temp 96.6°F | Resp 18

## 2024-07-09 DIAGNOSIS — D631 Anemia in chronic kidney disease: Secondary | ICD-10-CM | POA: Diagnosis not present

## 2024-07-09 DIAGNOSIS — N184 Chronic kidney disease, stage 4 (severe): Secondary | ICD-10-CM | POA: Diagnosis not present

## 2024-07-09 DIAGNOSIS — D509 Iron deficiency anemia, unspecified: Secondary | ICD-10-CM

## 2024-07-09 LAB — CBC
HCT: 30.3 % — ABNORMAL LOW (ref 39.0–52.0)
Hemoglobin: 10 g/dL — ABNORMAL LOW (ref 13.0–17.0)
MCH: 29.4 pg (ref 26.0–34.0)
MCHC: 33 g/dL (ref 30.0–36.0)
MCV: 89.1 fL (ref 80.0–100.0)
Platelets: 189 K/uL (ref 150–400)
RBC: 3.4 MIL/uL — ABNORMAL LOW (ref 4.22–5.81)
RDW: 14.6 % (ref 11.5–15.5)
WBC: 6.2 K/uL (ref 4.0–10.5)
nRBC: 0 % (ref 0.0–0.2)

## 2024-07-09 MED ORDER — EPOETIN ALFA-EPBX 10000 UNIT/ML IJ SOLN
10000.0000 [IU] | Freq: Once | INTRAMUSCULAR | Status: AC
  Administered 2024-07-09: 10000 [IU] via SUBCUTANEOUS
  Filled 2024-07-09: qty 1

## 2024-07-09 NOTE — Progress Notes (Signed)
 Andrew Adams presents today for injection per the provider's orders.  Retacrit  10,000U administration without incident; injection site WNL; see MAR for injection details.  Patient tolerated procedure well and without incident.  No questions or complaints noted at this time. Patient's hemoglobin noted to be 10 today.   Discharged from clinic ambulatory in stable condition. Alert and oriented x 3. F/U with [facility] as scheduled.

## 2024-07-09 NOTE — Patient Instructions (Signed)
 [facility] [place] - A DEPT OF [facility]  Discharge Instructions: Thank you for choosing [facility] to provide your oncology and hematology care.  If you have a lab appointment with the Cancer Center - please note that after April 8th, 2024, all labs will be drawn in the cancer center.  You do not have to check in or register with the main entrance as you have in the past but will complete your check-in in the cancer center.  Wear comfortable clothing and clothing appropriate for easy access to any Portacath or PICC line.   We strive to give you quality time with your provider. You may need to reschedule your appointment if you arrive late (15 or more minutes).  Arriving late affects you and other patients whose appointments are after yours.  Also, if you miss three or more appointments without notifying the office, you may be dismissed from the clinic at the provider's discretion.      For prescription refill requests, have your pharmacy contact our office and allow 72 hours for refills to be completed.    Today you received Retacrit 10,000U injection.     BELOW ARE SYMPTOMS THAT SHOULD BE REPORTED IMMEDIATELY: *FEVER GREATER THAN 100.4 F (38 C) OR HIGHER *CHILLS OR SWEATING *NAUSEA AND VOMITING THAT IS NOT CONTROLLED WITH YOUR NAUSEA MEDICATION *UNUSUAL SHORTNESS OF BREATH *UNUSUAL BRUISING OR BLEEDING *URINARY PROBLEMS (pain or burning when urinating, or frequent urination) *BOWEL PROBLEMS (unusual diarrhea, constipation, pain near the anus) TENDERNESS IN MOUTH AND THROAT WITH OR WITHOUT PRESENCE OF ULCERS (sore throat, sores in mouth, or a toothache) UNUSUAL RASH, SWELLING OR PAIN  UNUSUAL VAGINAL DISCHARGE OR ITCHING   Items with * indicate a potential emergency and should be followed up as soon as possible or go to the Emergency Department if any problems should occur.  Please show the CHEMOTHERAPY ALERT CARD or IMMUNOTHERAPY ALERT CARD at  check-in to the Emergency Department and triage nurse.  Should you have questions after your visit or need to cancel or reschedule your appointment, please contact [facility] [place] - A DEPT OF [facility] [phone]  and follow the prompts.  Office hours are 8:00 a.m. to 4:30 p.m. Monday - Friday. Please note that voicemails left after 4:00 p.m. may not be returned until the following business day.  We are closed weekends and major holidays. You have access to a nurse at all times for urgent questions. Please call the main number to the clinic [phone] and follow the prompts.  For any non-urgent questions, you may also contact your provider using MyChart. We now offer e-Visits for anyone 75 and older to request care online for non-urgent symptoms. For details visit [email or web address].   Also download the MyChart app! Go to the app store, search "MyChart", open the app, select [facility], and log in with your MyChart username and password.

## 2024-07-11 DIAGNOSIS — E119 Type 2 diabetes mellitus without complications: Secondary | ICD-10-CM | POA: Diagnosis not present

## 2024-07-11 DIAGNOSIS — H18413 Arcus senilis, bilateral: Secondary | ICD-10-CM | POA: Diagnosis not present

## 2024-07-11 DIAGNOSIS — H353131 Nonexudative age-related macular degeneration, bilateral, early dry stage: Secondary | ICD-10-CM | POA: Diagnosis not present

## 2024-07-11 DIAGNOSIS — Z961 Presence of intraocular lens: Secondary | ICD-10-CM | POA: Diagnosis not present

## 2024-07-19 ENCOUNTER — Other Ambulatory Visit (HOSPITAL_COMMUNITY)
Admission: RE | Admit: 2024-07-19 | Discharge: 2024-07-19 | Disposition: A | Discharge status: Home / Self Care | Source: Ambulatory Visit | Attending: Nephrology | Admitting: Nephrology

## 2024-07-19 DIAGNOSIS — R809 Proteinuria, unspecified: Secondary | ICD-10-CM | POA: Diagnosis not present

## 2024-07-19 DIAGNOSIS — N185 Chronic kidney disease, stage 5: Secondary | ICD-10-CM | POA: Diagnosis not present

## 2024-07-19 DIAGNOSIS — E211 Secondary hyperparathyroidism, not elsewhere classified: Secondary | ICD-10-CM | POA: Insufficient documentation

## 2024-07-19 DIAGNOSIS — D631 Anemia in chronic kidney disease: Secondary | ICD-10-CM | POA: Insufficient documentation

## 2024-07-19 LAB — HEPATITIS C ANTIBODY: HCV Ab: NONREACTIVE

## 2024-07-19 LAB — RENAL FUNCTION PANEL
Albumin: 4.1 g/dL (ref 3.5–5.0)
Anion gap: 19 — ABNORMAL HIGH (ref 5–15)
BUN: 90 mg/dL — ABNORMAL HIGH (ref 8–23)
CO2: 18 mmol/L — ABNORMAL LOW (ref 22–32)
Calcium: 9 mg/dL (ref 8.9–10.3)
Chloride: 94 mmol/L — ABNORMAL LOW (ref 98–111)
Creatinine, Ser: 4.58 mg/dL — ABNORMAL HIGH (ref 0.61–1.24)
GFR, Estimated: 12 mL/min — ABNORMAL LOW (ref >60)
Glucose, Bld: 109 mg/dL — ABNORMAL HIGH (ref 70–99)
Phosphorus: 4.5 mg/dL (ref 2.5–4.6)
Potassium: 4.3 mmol/L (ref 3.5–5.1)
Sodium: 131 mmol/L — ABNORMAL LOW (ref 135–145)

## 2024-07-19 LAB — CBC
HCT: 28.1 % — ABNORMAL LOW (ref 39.0–52.0)
Hemoglobin: 9.8 g/dL — ABNORMAL LOW (ref 13.0–17.0)
MCH: 29.8 pg (ref 26.0–34.0)
MCHC: 34.9 g/dL (ref 30.0–36.0)
MCV: 85.4 fL (ref 80.0–100.0)
Platelets: 199 K/uL (ref 150–400)
RBC: 3.29 MIL/uL — ABNORMAL LOW (ref 4.22–5.81)
RDW: 14.6 % (ref 11.5–15.5)
WBC: 5.5 K/uL (ref 4.0–10.5)
nRBC: 0 % (ref 0.0–0.2)

## 2024-07-19 LAB — PROTEIN / CREATININE RATIO, URINE
Creatinine, Urine: 95 mg/dL
Protein Creatinine Ratio: 0.15 mg/mg{creat} (ref 0.00–0.15)
Total Protein, Urine: 14 mg/dL

## 2024-07-19 LAB — HEPATITIS B SURFACE ANTIGEN: Hepatitis B Surface Ag: NONREACTIVE

## 2024-07-19 LAB — HEPATITIS B SURFACE ANTIBODY,QUALITATIVE: Hep B S Ab: NONREACTIVE

## 2024-07-20 LAB — HCV AB W REFLEX TO QUANT PCR: HCV Ab: NONREACTIVE

## 2024-07-20 LAB — HEPATITIS B CORE ANTIBODY, TOTAL: HEP B CORE AB: POSITIVE — AB

## 2024-07-20 LAB — HCV INTERPRETATION

## 2024-07-21 LAB — PARATHYROID HORMONE, INTACT (NO CA): PTH: 115 pg/mL — ABNORMAL HIGH (ref 15–65)

## 2024-07-22 LAB — QUANTIFERON-TB GOLD PLUS (RQFGPL)
QuantiFERON Mitogen Value: 5.53 [IU]/mL
QuantiFERON Nil Value: 0.08 [IU]/mL
QuantiFERON TB1 Ag Value: 0.07 [IU]/mL
QuantiFERON TB2 Ag Value: 0.07 [IU]/mL

## 2024-07-22 LAB — QUANTIFERON-TB GOLD PLUS: QuantiFERON-TB Gold Plus: NEGATIVE

## 2024-07-23 NOTE — Progress Notes (Unsigned)
 [facility] [address], [place] [address]   CLINIC:  Medical Oncology/Hematology  PCP:  Jolee Elsie RAMAN, PA [address] 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 male returns for routine follow-up of normocytic anemia. He was last seen by Pleasant Barefoot PA-C on 04/30/2024.  He continues to follow closely with nephrology (Dr. Leopoldo), and is potentially starting dialysis in the next few months if he continues to have worsening kidney function.  At today's visit, he reports feeling fairly well.   He is tolerating Retacrit  well without any major side effects. Blood pressure today is at goal (134/70), with hypertension being managed by PCP.  Patient reports compliance with BP medications. No symptoms concerning for DVT or PE. He reports occasional scant rectal bleeding from hemorrhoids (with wiping), but denies any major bleeding episodes. He has mild dyspnea with exertion. No fatigue, pica, headaches, chest pain, lightheadedness, or syncope.  He has 65% energy and 100% appetite. He has some fluctuations in weight that he attributes to fluid overload and diuresis.  ASSESSMENT & PLAN:  1.  Normocytic anemia - Patient seen at the request of Dr. Rachele for anemia in the setting of CKD stage IV/V - Combination anemia from CKD stage IV/V and functional iron  deficiency. - No prior history of blood transfusion. - EGD/EUS (05/19/2024):  Multiple gastric polyps without evidence of recent bleeding.  Multiple endoclips from previous resections.  Submucosal nodule in duodenal bulb.  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 - Colonoscopy (05/09/2024): Hemorrhoids, polyps x 6, diverticulosis. - SPEP and immunofixation unremarkable.  Kappa and  lambda free light chains elevated without elevated kappa lambda light chain ratio (consistent with CKD stage IV).  UPEP was negative. - Previous B12 and folate levels within the normal range. - Retacrit  initiated in February 2024.  Currently receiving Retacrit  10,000 units every 2 weeks. - Most recent IV iron  with Venofer  in April 2024 - He is taking iron  tablet daily  - Denies any major bleeding per rectum or melena. - Lab panel (07/24/24): Hgb 9.5/MCV 86.1. Creatinine 4.52/GFR 12. Ferritin 276, iron  saturation 32% - PLAN: We will INCREASE Retacrit  to 20,000 units every 2 weeks, as Hgb has been < 10.0 more than 50% of the time over the past 3 months. - No indication for IV iron  at this time - RTC 3 months  2.  Social/family history: - Lives with wife at home.  Independent of ADLs and IADLs.  Has been on O2 via nasal cannula since December 2023.  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  Quit smoking 25 years ago. - No family history of severe anemia.  Sister has cancer but type unknown to the patient.  PLAN SUMMARY:  >> CBC + Retacrit  every 2 weeks >> Labs (CBC/D, ferritin, iron /TIBC, BMP) + injection + OFFICE visit in 3 months     REVIEW OF SYSTEMS:  Review of Systems  Constitutional:  Positive for fatigue. Negative for appetite change, chills, diaphoresis, fever and unexpected weight change.  HENT:   Negative for lump/mass and nosebleeds.   Eyes:  Negative for eye problems.  Respiratory:  Positive for shortness of breath (with exertion). Negative for cough and hemoptysis.   Cardiovascular:  Negative for chest pain, leg swelling  and palpitations.  Gastrointestinal:  Negative for abdominal pain, blood in stool, constipation, diarrhea, nausea and vomiting.  Genitourinary:  Negative for hematuria.   Skin: Negative.   Neurological:  Negative for dizziness, headaches and light-headedness.  Hematological:  Does not bruise/bleed easily.     PHYSICAL EXAM:  ECOG  PERFORMANCE STATUS: 1 - Symptomatic but completely ambulatory  Vitals:   07/24/24 0818  BP: 134/70  Pulse: 81  Resp: 20  Temp: 98.2 F (36.8 C)  SpO2: 95%   Filed Weights   07/24/24 0818  Weight: 199 lb 11.8 oz (90.6 kg)   Physical Exam Constitutional:      Appearance: Normal appearance. He is obese.  Cardiovascular:     Heart sounds: Normal heart sounds.  Pulmonary:     Breath sounds: Normal breath sounds.  Neurological:     General: No focal deficit present.     Mental Status: Mental status is at baseline.  Psychiatric:        Behavior: Behavior normal. Behavior is cooperative.    PAST MEDICAL/SURGICAL HISTORY:  Past Medical History:  Diagnosis Date   Anemia    Borderline diabetic    Chronic kidney disease    Diabetes mellitus without complication (HCC)    Duodenal adenoma    2021   GERD (gastroesophageal reflux disease)    Gout    Gouty arthritis    H/O small bowel obstruction    History of back surgery    Hx of adenomatous colonic polyps    2021   Hyperlipidemia    Hypertension    PE (pulmonary embolism)    Sleep apnea    Past Surgical History:  Procedure Laterality Date   ABDOMINAL SURGERY  2103   Smal bowel obstruction    ABDOMINAL SURGERY  1981   Perforated large intestine   back     Lower back   BIOPSY  08/28/2020   Procedure: BIOPSY;  Surgeon: Eartha Angelia Sieving, MD;  [facility];  Service: Gastroenterology;;   BIOPSY  02/07/2022   Procedure: BIOPSY;  Surgeon: Wilhelmenia Aloha Raddle., MD;  Location: [facility];  Service: Gastroenterology;;   CATARACT EXTRACTION W/PHACO Right 01/06/2020   Procedure: CATARACT EXTRACTION PHACO AND INTRAOCULAR LENS PLACEMENT (IOC) (CDE: 6.48);  Surgeon: Harrie Agent, MD;  Location: [facility];  Service: Ophthalmology;  Laterality: Right;   CATARACT EXTRACTION W/PHACO Left 01/20/2020   Procedure: CATARACT EXTRACTION PHACO AND INTRAOCULAR LENS PLACEMENT (IOC);  Surgeon: Harrie Agent, MD;  Location:  [facility];  Service: Ophthalmology;  Laterality: Left;  CDE: 5.86   COLONOSCOPY N/A 05/09/2024   Procedure: COLONOSCOPY;  Surgeon: Wilhelmenia Aloha Raddle., MD;  [facility];  Service: Gastroenterology;  Laterality: N/A;   COLONOSCOPY WITH PROPOFOL  N/A 08/28/2020   Procedure: COLONOSCOPY WITH PROPOFOL ;  Surgeon: Eartha Angelia Sieving, MD;  [facility];  Service: Gastroenterology;  Laterality: N/A;  230   ENDOSCOPIC MUCOSAL RESECTION N/A 02/07/2022   Procedure: ENDOSCOPIC MUCOSAL RESECTION;  Surgeon: Wilhelmenia Aloha Raddle., MD;  [facility];  Service: Gastroenterology;  Laterality: N/A;   ENDOSCOPIC MUCOSAL RESECTION  05/09/2024   Procedure: RESECTION, MUCOSAL LESION, GI TRACT, ENDOSCOPIC;  Surgeon: Wilhelmenia Aloha Raddle., MD;  [facility];  Service: Gastroenterology;;   ESOPHAGOGASTRODUODENOSCOPY N/A 02/07/2022   Procedure: ESOPHAGOGASTRODUODENOSCOPY (EGD);  Surgeon: Wilhelmenia Aloha Raddle., MD;  Location: [facility];  Service: Gastroenterology;  Laterality: N/A;   ESOPHAGOGASTRODUODENOSCOPY N/A 05/09/2024   Procedure: EGD (ESOPHAGOGASTRODUODENOSCOPY);  Surgeon: Wilhelmenia Aloha Raddle., MD;  [facility];  Service: Gastroenterology;  Laterality: N/A;   ESOPHAGOGASTRODUODENOSCOPY (EGD) WITH PROPOFOL  N/A 08/28/2020   Procedure: ESOPHAGOGASTRODUODENOSCOPY (EGD) WITH PROPOFOL ;  Surgeon: Eartha Angelia Sieving, MD;  [facility];  Service: Gastroenterology;  Laterality: N/A;   ESOPHAGOGASTRODUODENOSCOPY (EGD) WITH PROPOFOL  N/A 12/14/2020   Procedure: ESOPHAGOGASTRODUODENOSCOPY (EGD) WITH PROPOFOL ;  Surgeon: Wilhelmenia Aloha Raddle., MD;  [facility];  Service: Gastroenterology;  Laterality: N/A;   EUS N/A 05/09/2024   Procedure: ULTRASOUND, UPPER GI TRACT, ENDOSCOPIC;  Surgeon: Wilhelmenia Aloha Raddle., MD;  [facility];  Service: Gastroenterology;  Laterality: N/A;   GIVENS CAPSULE STUDY N/A 09/10/2020   Procedure:  GIVENS CAPSULE STUDY;  Surgeon: Eartha Angelia Sieving, MD;  [facility];  Service: Gastroenterology;  Laterality: N/A;  730   HEMOSTASIS CLIP PLACEMENT  12/14/2020   Procedure: HEMOSTASIS CLIP PLACEMENT;  Surgeon: Wilhelmenia Aloha Raddle., MD;  Location: [facility];  Service: Gastroenterology;;   HEMOSTASIS CLIP PLACEMENT  02/07/2022   Procedure: HEMOSTASIS CLIP PLACEMENT;  Surgeon: Wilhelmenia Aloha Raddle., MD;  Location: [facility];  Service: Gastroenterology;;   HEMOSTASIS CLIP PLACEMENT  05/09/2024   Procedure: CONTROL OF HEMORRHAGE, GI TRACT, ENDOSCOPIC, BY CLIPPING OR OVERSEWING;  Surgeon: Wilhelmenia Aloha Raddle., MD;  Location: [facility];  Service: Gastroenterology;;   HEMOSTASIS CONTROL  12/14/2020   Procedure: HEMOSTASIS CONTROL;  Surgeon: Wilhelmenia Aloha Raddle., MD;  Location: [facility];  Service: Gastroenterology;;   HERNIA REPAIR     incisional   HOT HEMOSTASIS N/A 02/07/2022   Procedure: HOT HEMOSTASIS (ARGON PLASMA COAGULATION/BICAP);  Surgeon: Wilhelmenia Aloha Raddle., MD;  Location: [facility];  Service: Gastroenterology;  Laterality: N/A;   POLYPECTOMY  08/28/2020   Procedure: POLYPECTOMY;  Surgeon: Eartha Angelia Sieving, MD;  [facility];  Service: Gastroenterology;;   POLYPECTOMY  12/14/2020   Procedure: POLYPECTOMY;  Surgeon: Wilhelmenia Aloha Raddle., MD;  Location: [facility];  Service: Gastroenterology;;   MATIAS  12/14/2020   Procedure: MATIAS;  Surgeon: Mansouraty, Aloha Raddle., MD;  Location: [facility];  Service: Gastroenterology;;   ROBLEY MEYER INJECTION  02/07/2022   Procedure: SUBMUCOSAL LIFTING INJECTION;  Surgeon: Wilhelmenia Aloha Raddle., MD;  Location: [facility];  Service: Gastroenterology;;   SUBMUCOSAL TATTOO INJECTION  12/14/2020   Procedure: SUBMUCOSAL TATTOO INJECTION;  Surgeon: Wilhelmenia Aloha Raddle., MD;  [facility];  Service: Gastroenterology;;    SOCIAL HISTORY:  Social History    Socioeconomic History   Marital status: Married    Spouse name: Not on file   Number of children: 1   Years of education: Not on file   Highest education level: Not on file  Occupational History   Occupation: Finisher  Tobacco Use   Smoking status: Former    Current packs/day: 0.00    Average packs/day: 1 pack/day for 38.0 years (38.0 ttl pk-yrs)    Types: Cigarettes    Start date: 02/06/1957    Quit date: 02/07/1995    Years since quitting: 29.4   Smokeless tobacco: Never  Vaping Use   Vaping status: Never Used  Substance and Sexual Activity   Alcohol  use: Yes    Alcohol /week: 3.0 standard drinks of alcohol     Types: 1 Cans of beer, 2 Shots of liquor per week    Comment: occasional    Drug use: Not Currently    Types: Marijuana   Sexual activity: Yes  Other Topics Concern   Not on file  Social History Narrative   Lives with wife.     Social Drivers of Health  Financial Resource Strain: Not on file  Food Insecurity: No Food Insecurity (01/31/2023)   Hunger Vital Sign    Worried About Running Out of Food in the Last Year: Never true    Ran Out of Food in the Last Year: Never true  Transportation Needs: No Transportation Needs (01/31/2023)   PRAPARE - Administrator, Civil Service (Medical): No    Lack of Transportation (Non-Medical): No  Physical Activity: Not on file  Stress: Not on file  Social Connections: Not on file  Intimate Partner Violence: Not At Risk (01/31/2023)   Humiliation, Afraid, Rape, and Kick questionnaire    Fear of Current or Ex-Partner: No    Emotionally Abused: No    Physically Abused: No    Sexually Abused: No    FAMILY HISTORY:  Family History  Problem Relation Age of Onset   Hypertension Sister    Diabetes Sister    Diabetes Sister    Hypertension Mother    Diabetes Mother    Hypertension Father    Ulcers Father    Colon cancer Neg Hx    Esophageal cancer Neg Hx    Stomach cancer Neg Hx    Inflammatory bowel disease Neg  Hx    Liver disease Neg Hx    Pancreatic cancer Neg Hx    Rectal cancer Neg Hx     CURRENT MEDICATIONS:  Outpatient Encounter Medications as of 07/24/2024  Medication Sig Note   albuterol  (VENTOLIN  HFA) 108 (90 Base) MCG/ACT inhaler Inhale 2 puffs into the lungs every 4 (four) hours as needed.    allopurinol  (ZYLOPRIM ) 100 MG tablet Take 1 tablet (100 mg total) by mouth daily.    amLODipine  (NORVASC ) 5 MG tablet Take 10 mg by mouth daily. 11/22/2023: Taking 10mg  once a day    apixaban  (ELIQUIS ) 5 MG TABS tablet Take 5 mg by mouth 2 (two) times daily.    atorvastatin  (LIPITOR) 40 MG tablet Take 1 tablet (40 mg total) by mouth daily.    bisoprolol  (ZEBETA ) 5 MG tablet Take 1 tablet (5 mg total) by mouth daily.    calcitRIOL (ROCALTROL) 0.25 MCG capsule Take 0.25 mcg by mouth.    epoetin  alfa (EPOGEN ) 3000 UNIT/ML injection Inject into the skin.    epoetin  alfa-epbx (RETACRIT ) 3000 UNIT/ML injection Inject 3,000 Units into the vein every 14 (fourteen) days.    FEROSUL 325 (65 Fe) MG tablet TAKE ONE TABLET BY MOUTH EVERY MORNING WITH BREAKFAST    furosemide  (LASIX ) 40 MG tablet Take 40 mg by mouth 2 (two) times daily.    hydrALAZINE  (APRESOLINE ) 25 MG tablet Take 1 tablet (25 mg total) by mouth every 6 (six) hours. 01/23/2023: His after visit summary from Dr. Rachele says 2 tabs TID   ipratropium-albuterol  (DUONEB) 0.5-2.5 (3) MG/3ML SOLN Take 3 mLs by nebulization every 6 (six) hours as needed.    Multiple Vitamin (MULTIVITAMIN WITH MINERALS) TABS tablet Take 1 tablet by mouth daily.    Na Sulfate-K Sulfate-Mg Sulfate concentrate (SUPREP BOWEL PREP KIT) 17.5-3.13-1.6 GM/177ML SOLN Take 1 kit (354 mLs total) by mouth as directed. For colonoscopy prep    pantoprazole  (PROTONIX ) 40 MG tablet Take 1 tablet (40 mg total) by mouth 2 (two) times daily before a meal.    spironolactone (ALDACTONE) 25 MG tablet Take 25 mg by mouth daily.    TRULICITY 0.75 MG/0.5ML SOPN Inject 0.75 mg into the skin every  Friday.    No facility-administered encounter medications on file as of  07/24/2024.    ALLERGIES:  No Known Allergies  LABORATORY DATA:  I have reviewed the labs as listed.  CBC    Component Value Date/Time   WBC 5.4 07/24/2024 0800   RBC 3.17 (L) 07/24/2024 0800   HGB 9.5 (L) 07/24/2024 0800   HCT 27.3 (L) 07/24/2024 0800   PLT 174 07/24/2024 0800   MCV 86.1 07/24/2024 0800   MCV 84.2 04/12/2013 1501   MCH 30.0 07/24/2024 0800   MCHC 34.8 07/24/2024 0800   RDW 14.2 07/24/2024 0800   LYMPHSABS 1.8 07/24/2024 0800   MONOABS 0.6 07/24/2024 0800   EOSABS 0.2 07/24/2024 0800   BASOSABS 0.0 07/24/2024 0800      Latest Ref Rng & Units 07/24/2024    8:00 AM 07/19/2024    1:12 PM 06/19/2024   12:00 PM  CMP  Glucose 70 - 99 mg/dL 890  890  875   BUN 8 - 23 mg/dL 76  90  87   Creatinine 0.61 - 1.24 mg/dL 5.47  5.41  6.05   Sodium 135 - 145 mmol/L 134  131  133   Potassium 3.5 - 5.1 mmol/L 4.1  4.3  4.3   Chloride 98 - 111 mmol/L 98  94  96   CO2 22 - 32 mmol/L 22  18  22    Calcium  8.9 - 10.3 mg/dL 8.7  9.0  9.2     DIAGNOSTIC IMAGING:  I have independently reviewed the relevant imaging and discussed with the patient.   WRAP UP:  All questions were answered. The patient knows to call the clinic with any problems, questions or concerns.  Medical decision making: Low  Time spent on visit: I spent 15 minutes counseling the patient face to face. The total time spent in the appointment was 22 minutes and more than 50% was on counseling.  Pleasant CHRISTELLA Barefoot, PA-C  07/24/24 8:58 AM

## 2024-07-24 ENCOUNTER — Inpatient Hospital Stay

## 2024-07-24 ENCOUNTER — Other Ambulatory Visit: Payer: Self-pay | Admitting: Adult Health

## 2024-07-24 ENCOUNTER — Inpatient Hospital Stay (HOSPITAL_BASED_OUTPATIENT_CLINIC_OR_DEPARTMENT_OTHER): Admitting: Physician Assistant

## 2024-07-24 VITALS — BP 134/70 | HR 81 | Temp 98.2°F | Resp 20 | Wt 199.7 lb

## 2024-07-24 DIAGNOSIS — N185 Chronic kidney disease, stage 5: Secondary | ICD-10-CM | POA: Diagnosis not present

## 2024-07-24 DIAGNOSIS — N184 Chronic kidney disease, stage 4 (severe): Secondary | ICD-10-CM

## 2024-07-24 DIAGNOSIS — D631 Anemia in chronic kidney disease: Secondary | ICD-10-CM | POA: Diagnosis not present

## 2024-07-24 DIAGNOSIS — D509 Iron deficiency anemia, unspecified: Secondary | ICD-10-CM

## 2024-07-24 LAB — CBC WITH DIFFERENTIAL/PLATELET
Abs Immature Granulocytes: 0.03 K/uL (ref 0.00–0.07)
Basophils Absolute: 0 K/uL (ref 0.0–0.1)
Basophils Relative: 1 %
Eosinophils Absolute: 0.2 K/uL (ref 0.0–0.5)
Eosinophils Relative: 4 %
HCT: 27.3 % — ABNORMAL LOW (ref 39.0–52.0)
Hemoglobin: 9.5 g/dL — ABNORMAL LOW (ref 13.0–17.0)
Immature Granulocytes: 1 %
Lymphocytes Relative: 34 %
Lymphs Abs: 1.8 K/uL (ref 0.7–4.0)
MCH: 30 pg (ref 26.0–34.0)
MCHC: 34.8 g/dL (ref 30.0–36.0)
MCV: 86.1 fL (ref 80.0–100.0)
Monocytes Absolute: 0.6 K/uL (ref 0.1–1.0)
Monocytes Relative: 11 %
Neutro Abs: 2.7 K/uL (ref 1.7–7.7)
Neutrophils Relative %: 49 %
Platelets: 174 K/uL (ref 150–400)
RBC: 3.17 MIL/uL — ABNORMAL LOW (ref 4.22–5.81)
RDW: 14.2 % (ref 11.5–15.5)
WBC: 5.4 K/uL (ref 4.0–10.5)
nRBC: 0 % (ref 0.0–0.2)

## 2024-07-24 LAB — IRON AND TIBC
Iron: 88 ug/dL (ref 45–182)
Saturation Ratios: 32 % (ref 17.9–39.5)
TIBC: 272 ug/dL (ref 250–450)
UIBC: 184 ug/dL

## 2024-07-24 LAB — FERRITIN: Ferritin: 276 ng/mL (ref 24–336)

## 2024-07-24 LAB — BASIC METABOLIC PANEL WITH GFR
Anion gap: 14 (ref 5–15)
BUN: 76 mg/dL — ABNORMAL HIGH (ref 8–23)
CO2: 22 mmol/L (ref 22–32)
Calcium: 8.7 mg/dL — ABNORMAL LOW (ref 8.9–10.3)
Chloride: 98 mmol/L (ref 98–111)
Creatinine, Ser: 4.52 mg/dL — ABNORMAL HIGH (ref 0.61–1.24)
GFR, Estimated: 12 mL/min — ABNORMAL LOW (ref >60)
Glucose, Bld: 109 mg/dL — ABNORMAL HIGH (ref 70–99)
Potassium: 4.1 mmol/L (ref 3.5–5.1)
Sodium: 134 mmol/L — ABNORMAL LOW (ref 135–145)

## 2024-07-24 MED ORDER — EPOETIN ALFA-EPBX 20000 UNIT/ML IJ SOLN
20000.0000 [IU] | Freq: Once | INTRAMUSCULAR | Status: AC
  Administered 2024-07-24: 20000 [IU] via SUBCUTANEOUS
  Filled 2024-07-24: qty 1

## 2024-07-24 NOTE — Patient Instructions (Signed)
 [facility] at [facility] **VISIT SUMMARY & IMPORTANT INSTRUCTIONS **   You were seen today by Pleasant Barefoot PA-C for your anemia.   Your anemia is related to your chronic kidney disease. Continue Retacrit  shots every 2 weeks.  FOLLOW-UP APPOINTMENT: Labs and office visit in 3 months  ** Thank you for trusting me with your healthcare!  I strive to provide all of my patients with quality care at each visit.  If you receive a survey for this visit, I would be so grateful to you for taking the time to provide feedback.  Thank you in advance!  ~ Armie Moren                                        Dr. Mickiel Davonna Pleasant Barefoot, PA-C     Delon Hope, NP   - - - - - - - - - - - - - - - - - -     Thank you for choosing [facility] at [facility] to provide your oncology and hematology care.  To afford each patient quality time with our provider, please arrive at least 15 minutes before your scheduled appointment time.   If you have a lab appointment with the Cancer Center please come in thru the Main Entrance and check in at the main information desk.  You need to re-schedule your appointment should you arrive 10 or more minutes late.  We strive to give you quality time with our providers, and arriving late affects you and other patients whose appointments are after yours.  Also, if you no show three or more times for appointments you may be dismissed from the clinic at the providers discretion.     Again, thank you for choosing [facility].  Our hope is that these requests will decrease the amount of time that you wait before being seen by our physicians.       _____________________________________________________________  Should you have questions after your visit to [facility], please contact our office at [phone] and follow the prompts.  Our office hours are 8:00 a.m. and 4:30 p.m. Monday - Friday.   Please note that voicemails left after 4:00 p.m. may not be returned until the following business day.  We are closed weekends and major holidays.  You do have access to a nurse 24-7, just call the main number to the clinic [phone] and do not press any options, hold on the line and a nurse will answer the phone.    For prescription refill requests, have your pharmacy contact our office and allow 72 hours.

## 2024-07-24 NOTE — Addendum Note (Signed)
 Addended by: LAMON HERTER on: 07/24/2024 09:03 AM   Modules accepted: Orders

## 2024-07-24 NOTE — Progress Notes (Signed)
 Retacrti 20,000 mg injection given per orders. Patient tolerated it well without problems. Vitals stable and discharged home from clinic ambulatory. Follow up as scheduled.

## 2024-07-29 DIAGNOSIS — E871 Hypo-osmolality and hyponatremia: Secondary | ICD-10-CM | POA: Diagnosis not present

## 2024-07-29 DIAGNOSIS — N184 Chronic kidney disease, stage 4 (severe): Secondary | ICD-10-CM | POA: Diagnosis not present

## 2024-07-29 DIAGNOSIS — E1122 Type 2 diabetes mellitus with diabetic chronic kidney disease: Secondary | ICD-10-CM | POA: Diagnosis not present

## 2024-07-29 DIAGNOSIS — R809 Proteinuria, unspecified: Secondary | ICD-10-CM | POA: Diagnosis not present

## 2024-08-07 ENCOUNTER — Inpatient Hospital Stay

## 2024-08-07 ENCOUNTER — Inpatient Hospital Stay: Attending: Hematology

## 2024-08-07 VITALS — BP 130/78 | HR 87 | Temp 98.4°F | Resp 18

## 2024-08-07 DIAGNOSIS — D631 Anemia in chronic kidney disease: Secondary | ICD-10-CM | POA: Diagnosis not present

## 2024-08-07 DIAGNOSIS — D509 Iron deficiency anemia, unspecified: Secondary | ICD-10-CM

## 2024-08-07 DIAGNOSIS — I129 Hypertensive chronic kidney disease with stage 1 through stage 4 chronic kidney disease, or unspecified chronic kidney disease: Secondary | ICD-10-CM | POA: Diagnosis present

## 2024-08-07 DIAGNOSIS — N184 Chronic kidney disease, stage 4 (severe): Secondary | ICD-10-CM | POA: Diagnosis not present

## 2024-08-07 DIAGNOSIS — Z79899 Other long term (current) drug therapy: Secondary | ICD-10-CM | POA: Insufficient documentation

## 2024-08-07 LAB — CBC
HCT: 29.6 % — ABNORMAL LOW (ref 39.0–52.0)
Hemoglobin: 10 g/dL — ABNORMAL LOW (ref 13.0–17.0)
MCH: 29.2 pg (ref 26.0–34.0)
MCHC: 33.8 g/dL (ref 30.0–36.0)
MCV: 86.5 fL (ref 80.0–100.0)
Platelets: 159 K/uL (ref 150–400)
RBC: 3.42 MIL/uL — ABNORMAL LOW (ref 4.22–5.81)
RDW: 14.9 % (ref 11.5–15.5)
WBC: 5.8 K/uL (ref 4.0–10.5)
nRBC: 0 % (ref 0.0–0.2)

## 2024-08-07 MED ORDER — EPOETIN ALFA-EPBX 20000 UNIT/ML IJ SOLN
20000.0000 [IU] | Freq: Once | INTRAMUSCULAR | Status: AC
  Administered 2024-08-07: 20000 [IU] via SUBCUTANEOUS
  Filled 2024-08-07: qty 7

## 2024-08-07 NOTE — Patient Instructions (Signed)

## 2024-08-07 NOTE — Progress Notes (Signed)
 Patient's Hgb 10 and blood pressure stable. Patient  tolerated Retacrit  injection with no complaints voiced.  Site clean and dry with no bruising or swelling noted at site.  See MAR for details.  Band aid applied.  Patient stable during and after injection.  Vss with discharge and left in satisfactory condition with no s/s of distress noted. All follow ups as scheduled.   Andrew Adams

## 2024-08-21 ENCOUNTER — Inpatient Hospital Stay

## 2024-08-21 VITALS — BP 148/97 | HR 80 | Temp 98.2°F | Resp 18

## 2024-08-21 DIAGNOSIS — D509 Iron deficiency anemia, unspecified: Secondary | ICD-10-CM

## 2024-08-21 DIAGNOSIS — D631 Anemia in chronic kidney disease: Secondary | ICD-10-CM

## 2024-08-21 DIAGNOSIS — I129 Hypertensive chronic kidney disease with stage 1 through stage 4 chronic kidney disease, or unspecified chronic kidney disease: Secondary | ICD-10-CM | POA: Diagnosis not present

## 2024-08-21 LAB — CBC
HCT: 30.4 % — ABNORMAL LOW (ref 39.0–52.0)
Hemoglobin: 10.2 g/dL — ABNORMAL LOW (ref 13.0–17.0)
MCH: 29.3 pg (ref 26.0–34.0)
MCHC: 33.6 g/dL (ref 30.0–36.0)
MCV: 87.4 fL (ref 80.0–100.0)
Platelets: 182 K/uL (ref 150–400)
RBC: 3.48 MIL/uL — ABNORMAL LOW (ref 4.22–5.81)
RDW: 15.7 % — ABNORMAL HIGH (ref 11.5–15.5)
WBC: 5.9 K/uL (ref 4.0–10.5)
nRBC: 0 % (ref 0.0–0.2)

## 2024-08-21 MED ORDER — EPOETIN ALFA-EPBX 20000 UNIT/ML IJ SOLN
20000.0000 [IU] | Freq: Once | INTRAMUSCULAR | Status: AC
  Administered 2024-08-21: 20000 [IU] via SUBCUTANEOUS
  Filled 2024-08-21: qty 1

## 2024-08-21 NOTE — Progress Notes (Signed)
 Patient presents today for Retacrit  injection per providers order.  Vital signs within parameters for injection.  HGB 10.2.  Stable during administration without incident; injection site WNL; see MAR for injection details.  Patient tolerated procedure well and without incident.  No questions or complaints noted at this time.

## 2024-08-21 NOTE — Patient Instructions (Signed)
 [facility] - A DEPT OF [facility]  Discharge Instructions: Thank you for choosing [facility] to provide your oncology and hematology care.  If you have a lab appointment with the Cancer Center - please note that after April 8th, 2024, all labs will be drawn in the cancer center.  You do not have to check in or register with the main entrance as you have in the past but will complete your check-in in the cancer center.  Wear comfortable clothing and clothing appropriate for easy access to any Portacath or PICC line.   We strive to give you quality time with your provider. You may need to reschedule your appointment if you arrive late (15 or more minutes).  Arriving late affects you and other patients whose appointments are after yours.  Also, if you miss three or more appointments without notifying the office, you may be dismissed from the clinic at the provider's discretion.      For prescription refill requests, have your pharmacy contact our office and allow 72 hours for refills to be completed.    Today you received the following chemotherapy and/or immunotherapy agents Retacrit      To help prevent nausea and vomiting after your treatment, we encourage you to take your nausea medication as directed.  BELOW ARE SYMPTOMS THAT SHOULD BE REPORTED IMMEDIATELY: *FEVER GREATER THAN 100.4 F (38 C) OR HIGHER *CHILLS OR SWEATING *NAUSEA AND VOMITING THAT IS NOT CONTROLLED WITH YOUR NAUSEA MEDICATION *UNUSUAL SHORTNESS OF BREATH *UNUSUAL BRUISING OR BLEEDING *URINARY PROBLEMS (pain or burning when urinating, or frequent urination) *BOWEL PROBLEMS (unusual diarrhea, constipation, pain near the anus) TENDERNESS IN MOUTH AND THROAT WITH OR WITHOUT PRESENCE OF ULCERS (sore throat, sores in mouth, or a toothache) UNUSUAL RASH, SWELLING OR PAIN  UNUSUAL VAGINAL DISCHARGE OR ITCHING   Items with * indicate a potential emergency and should be followed up  as soon as possible or go to the Emergency Department if any problems should occur.  Please show the CHEMOTHERAPY ALERT CARD or IMMUNOTHERAPY ALERT CARD at check-in to the Emergency Department and triage nurse.  Should you have questions after your visit or need to cancel or reschedule your appointment, please contact [facility] [place] - A DEPT OF [facility] [phone]  and follow the prompts.  Office hours are 8:00 a.m. to 4:30 p.m. Monday - Friday. Please note that voicemails left after 4:00 p.m. may not be returned until the following business day.  We are closed weekends and major holidays. You have access to a nurse at all times for urgent questions. Please call the main number to the clinic [phone] and follow the prompts.  For any non-urgent questions, you may also contact your provider using MyChart. We now offer e-Visits for anyone 90 and older to request care online for non-urgent symptoms. For details visit [email or web address].   Also download the MyChart app! Go to the app store, search "MyChart", open the app, select [place], and log in with your MyChart username and password.

## 2024-09-04 ENCOUNTER — Inpatient Hospital Stay: Attending: Hematology

## 2024-09-04 ENCOUNTER — Inpatient Hospital Stay

## 2024-09-04 VITALS — BP 129/84 | HR 80 | Temp 96.7°F | Resp 18

## 2024-09-04 DIAGNOSIS — D509 Iron deficiency anemia, unspecified: Secondary | ICD-10-CM

## 2024-09-04 DIAGNOSIS — D631 Anemia in chronic kidney disease: Secondary | ICD-10-CM

## 2024-09-04 LAB — CBC
HCT: 30.2 % — ABNORMAL LOW (ref 39.0–52.0)
Hemoglobin: 10.2 g/dL — ABNORMAL LOW (ref 13.0–17.0)
MCH: 29.1 pg (ref 26.0–34.0)
MCHC: 33.8 g/dL (ref 30.0–36.0)
MCV: 86.3 fL (ref 80.0–100.0)
Platelets: 174 K/uL (ref 150–400)
RBC: 3.5 MIL/uL — ABNORMAL LOW (ref 4.22–5.81)
RDW: 15.2 % (ref 11.5–15.5)
WBC: 5 K/uL (ref 4.0–10.5)
nRBC: 0 % (ref 0.0–0.2)

## 2024-09-04 MED ORDER — EPOETIN ALFA-EPBX 20000 UNIT/ML IJ SOLN
20000.0000 [IU] | Freq: Once | INTRAMUSCULAR | Status: AC
  Administered 2024-09-04: 20000 [IU] via SUBCUTANEOUS
  Filled 2024-09-04: qty 1

## 2024-09-04 NOTE — Patient Instructions (Signed)

## 2024-09-04 NOTE — Progress Notes (Signed)
 Patient's Hgb 10.2 and blood pressure stable. Patient tolerated Retacrit  injection with no complaints voiced.  Site clean and dry with no bruising or swelling noted at site.  See MAR for details.  Band aid applied.  Patient stable during and after injection.  Vss with discharge and left in satisfactory condition with no s/s of distress noted. All follow ups as scheduled.   Andrew Adams

## 2024-09-06 ENCOUNTER — Other Ambulatory Visit (HOSPITAL_COMMUNITY)
Admission: RE | Admit: 2024-09-06 | Discharge: 2024-09-06 | Disposition: A | Discharge status: Home / Self Care | Source: Ambulatory Visit | Attending: Nephrology | Admitting: Nephrology

## 2024-09-06 DIAGNOSIS — N189 Chronic kidney disease, unspecified: Secondary | ICD-10-CM | POA: Insufficient documentation

## 2024-09-06 LAB — RENAL FUNCTION PANEL
Albumin: 4.2 g/dL (ref 3.5–5.0)
Anion gap: 16 — ABNORMAL HIGH (ref 5–15)
BUN: 90 mg/dL — ABNORMAL HIGH (ref 8–23)
CO2: 19 mmol/L — ABNORMAL LOW (ref 22–32)
Calcium: 8.9 mg/dL (ref 8.9–10.3)
Chloride: 96 mmol/L — ABNORMAL LOW (ref 98–111)
Creatinine, Ser: 4.74 mg/dL — ABNORMAL HIGH (ref 0.61–1.24)
GFR, Estimated: 12 mL/min — ABNORMAL LOW (ref >60)
Glucose, Bld: 118 mg/dL — ABNORMAL HIGH (ref 70–99)
Phosphorus: 4.2 mg/dL (ref 2.5–4.6)
Potassium: 4.4 mmol/L (ref 3.5–5.1)
Sodium: 131 mmol/L — ABNORMAL LOW (ref 135–145)

## 2024-09-06 LAB — CBC
HCT: 29.6 % — ABNORMAL LOW (ref 39.0–52.0)
Hemoglobin: 10.3 g/dL — ABNORMAL LOW (ref 13.0–17.0)
MCH: 29.9 pg (ref 26.0–34.0)
MCHC: 34.8 g/dL (ref 30.0–36.0)
MCV: 85.8 fL (ref 80.0–100.0)
Platelets: 182 K/uL (ref 150–400)
RBC: 3.45 MIL/uL — ABNORMAL LOW (ref 4.22–5.81)
RDW: 15.1 % (ref 11.5–15.5)
WBC: 5.7 K/uL (ref 4.0–10.5)
nRBC: 0 % (ref 0.0–0.2)

## 2024-09-16 ENCOUNTER — Other Ambulatory Visit (HOSPITAL_COMMUNITY)
Admission: RE | Admit: 2024-09-16 | Discharge: 2024-09-16 | Disposition: A | Discharge status: Home / Self Care | Source: Ambulatory Visit | Attending: Nephrology | Admitting: Nephrology

## 2024-09-16 DIAGNOSIS — N185 Chronic kidney disease, stage 5: Secondary | ICD-10-CM | POA: Insufficient documentation

## 2024-09-16 LAB — HEPATITIS B SURFACE ANTIBODY,QUALITATIVE: Hep B S Ab: NONREACTIVE

## 2024-09-16 LAB — HEPATITIS B SURFACE ANTIGEN: Hepatitis B Surface Ag: NONREACTIVE

## 2024-09-16 LAB — HEPATITIS B CORE ANTIBODY, IGM: Hep B C IgM: NONREACTIVE

## 2024-09-17 ENCOUNTER — Other Ambulatory Visit: Payer: Self-pay | Admitting: Adult Health

## 2024-09-17 LAB — HEPATITIS B CORE ANTIBODY, TOTAL: HEP B CORE AB: POSITIVE — AB

## 2024-09-17 LAB — HCV AB W REFLEX TO QUANT PCR: HCV Ab: NONREACTIVE

## 2024-09-17 LAB — HCV INTERPRETATION

## 2024-09-17 NOTE — Telephone Encounter (Signed)
J44.9

## 2024-09-18 ENCOUNTER — Inpatient Hospital Stay

## 2024-09-18 VITALS — BP 125/74 | HR 90 | Temp 97.7°F | Resp 20

## 2024-09-18 DIAGNOSIS — D509 Iron deficiency anemia, unspecified: Secondary | ICD-10-CM

## 2024-09-18 DIAGNOSIS — D631 Anemia in chronic kidney disease: Secondary | ICD-10-CM

## 2024-09-18 LAB — CBC
HCT: 31.1 % — ABNORMAL LOW (ref 39.0–52.0)
Hemoglobin: 10.6 g/dL — ABNORMAL LOW (ref 13.0–17.0)
MCH: 29.6 pg (ref 26.0–34.0)
MCHC: 34.1 g/dL (ref 30.0–36.0)
MCV: 86.9 fL (ref 80.0–100.0)
Platelets: 151 K/uL (ref 150–400)
RBC: 3.58 MIL/uL — ABNORMAL LOW (ref 4.22–5.81)
RDW: 15.2 % (ref 11.5–15.5)
WBC: 4.5 K/uL (ref 4.0–10.5)
nRBC: 0 % (ref 0.0–0.2)

## 2024-09-18 MED ORDER — EPOETIN ALFA-EPBX 20000 UNIT/ML IJ SOLN
20000.0000 [IU] | Freq: Once | INTRAMUSCULAR | Status: AC
  Administered 2024-09-18: 20000 [IU] via SUBCUTANEOUS
  Filled 2024-09-18: qty 1

## 2024-09-18 NOTE — Progress Notes (Signed)
 Patient's Hgb 10.6 and blood pressure stable. Patient tolerated Retacrit injection with no complaints voiced.  Site clean and dry with no bruising or swelling noted at site.  See MAR for details.  Band aid applied.  Patient stable during and after injection.  Vss with discharge and left in satisfactory condition with no s/s of distress noted. All follow ups as scheduled.   Andrew Adams [employer]

## 2024-09-18 NOTE — Patient Instructions (Signed)

## 2024-09-30 ENCOUNTER — Telehealth: Payer: Self-pay

## 2024-09-30 NOTE — Telephone Encounter (Signed)
 Attempted to call for surgery scheduling. LVM for spouse.  TDC in cath lab w/ VWB on 12/2

## 2024-09-30 NOTE — Telephone Encounter (Signed)
 Rec'd request from Renal Lab staff to inquire if patient could be scheduled 12/2 with VWB in Cath Lab for [facility].  LVM for patient.

## 2024-10-02 ENCOUNTER — Inpatient Hospital Stay: Attending: Hematology

## 2024-10-02 ENCOUNTER — Inpatient Hospital Stay

## 2024-10-02 VITALS — BP 138/81 | HR 85 | Temp 98.3°F | Resp 18

## 2024-10-02 DIAGNOSIS — D509 Iron deficiency anemia, unspecified: Secondary | ICD-10-CM

## 2024-10-02 DIAGNOSIS — D631 Anemia in chronic kidney disease: Secondary | ICD-10-CM | POA: Insufficient documentation

## 2024-10-02 DIAGNOSIS — I132 Hypertensive heart and chronic kidney disease with heart failure and with stage 5 chronic kidney disease, or end stage renal disease: Secondary | ICD-10-CM | POA: Diagnosis present

## 2024-10-02 DIAGNOSIS — Z79899 Other long term (current) drug therapy: Secondary | ICD-10-CM | POA: Diagnosis not present

## 2024-10-02 DIAGNOSIS — N185 Chronic kidney disease, stage 5: Secondary | ICD-10-CM | POA: Diagnosis not present

## 2024-10-02 LAB — CBC
HCT: 30.5 % — ABNORMAL LOW (ref 39.0–52.0)
Hemoglobin: 10.3 g/dL — ABNORMAL LOW (ref 13.0–17.0)
MCH: 29.9 pg (ref 26.0–34.0)
MCHC: 33.8 g/dL (ref 30.0–36.0)
MCV: 88.4 fL (ref 80.0–100.0)
Platelets: 161 K/uL (ref 150–400)
RBC: 3.45 MIL/uL — ABNORMAL LOW (ref 4.22–5.81)
RDW: 15.4 % (ref 11.5–15.5)
WBC: 3.6 K/uL — ABNORMAL LOW (ref 4.0–10.5)
nRBC: 0 % (ref 0.0–0.2)

## 2024-10-02 MED ORDER — EPOETIN ALFA-EPBX 20000 UNIT/ML IJ SOLN
20000.0000 [IU] | Freq: Once | INTRAMUSCULAR | Status: AC
  Administered 2024-10-02: 20000 [IU] via SUBCUTANEOUS
  Filled 2024-10-02: qty 1

## 2024-10-02 NOTE — Patient Instructions (Signed)

## 2024-10-02 NOTE — Progress Notes (Signed)
Patient presents today for Retacrit injection. Hemoglobin reviewed prior to administration. VSS tolerated without incident or complaint. See MAR for details. Patient stable during and after injection. Patient discharged in satisfactory condition with no s/s of distress noted.  

## 2024-10-04 ENCOUNTER — Encounter (HOSPITAL_COMMUNITY): Payer: Self-pay

## 2024-10-04 ENCOUNTER — Ambulatory Visit (HOSPITAL_COMMUNITY): Admit: 2024-10-04 | Admitting: Vascular Surgery

## 2024-10-04 SURGERY — DIALYSIS/PERMA CATHETER INSERTION
Anesthesia: LOCAL | Site: Chest

## 2024-10-16 ENCOUNTER — Inpatient Hospital Stay

## 2024-10-29 NOTE — Progress Notes (Unsigned)
 "   [facility] [address], [place] [address]   CLINIC:  Medical Oncology/Hematology  PCP:  Jolee Elsie RAMAN, PA [address] 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 male returns for routine follow-up of normocytic anemia. He was last seen by Pleasant Barefoot PA-C on 07/24/2024.  In the interim since last visit, he started hemodialysis on ***, and is now receiving Retacrit  with dialysis infusions. He continues to follow closely with nephrology (Dr. Leopoldo).  At today's visit, he reports feeling fairly well.  *** He has 65***% energy and 100***% appetite. He has some fluctuations in weight that he attributes to fluid overload and diuresis.***  No symptoms concerning for DVT or PE.*** He reports occasional scant rectal bleeding from hemorrhoids (with wiping), but denies any major bleeding episodes.*** He has mild dyspnea with exertion.*** No fatigue, pica, headaches, chest pain, lightheadedness, or syncope.***  ASSESSMENT & PLAN:  1.  Normocytic anemia - Patient seen at the request of Dr. Rachele for anemia in the setting of CKD stage IV/V - Combination anemia from CKD stage IV/V and functional iron  deficiency. - No prior history of blood transfusion. - EGD/EUS (05/19/2024):  Multiple gastric polyps without evidence of recent bleeding.  Multiple endoclips from previous resections.  Submucosal nodule in duodenal bulb.  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 - Colonoscopy (05/09/2024): Hemorrhoids, polyps x 6, diverticulosis. - SPEP and immunofixation unremarkable.  Kappa and lambda free light chains elevated without elevated kappa lambda light chain ratio (consistent with CKD stage IV).  UPEP was negative. - Previous B12 and folate  levels within the normal range. - Retacrit  initiated in February 2024.   - In the interim since last visit, he started hemodialysis on ***, and is now receiving Retacrit  with dialysis infusions.*** - Most recent IV iron  with Venofer  in April 2024 - He is taking iron  tablet daily *** - Denies any major bleeding per rectum or melena.*** - Lab panel (***): Hgb ***/MCV ***. Creatinine ***/GFR ***. Ferritin ***, iron  saturation ***% - PLAN: ***Discharge to nephrology? *** We will INCREASE Retacrit  to 20,000 units every 2 weeks, as Hgb has been < 10.0 more than 50% of the time over the past 3 months. - No indication for IV iron  at this time*** - RTC 3 months***  2.  Social/family history: - Lives with wife at home.  Independent of ADLs and IADLs.  Has been on O2 via nasal cannula since December 2023.  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  Quit smoking 25 years ago. - No family history of severe anemia.  Sister has cancer but type unknown to the patient.  PLAN SUMMARY: ***Possible discharge?  *** >> CBC + Retacrit  every 2 weeks >> Labs (CBC/D, ferritin, iron /TIBC, BMP) + injection + OFFICE visit in 3 months     REVIEW OF SYSTEMS:***  Review of Systems  Constitutional:  Positive for fatigue. Negative for appetite change, chills, diaphoresis, fever and unexpected weight change.  HENT:   Negative for lump/mass and nosebleeds.   Eyes:  Negative for eye problems.  Respiratory:  Positive for shortness of breath (with exertion). Negative for cough and hemoptysis.   Cardiovascular:  Negative for chest pain, leg swelling and palpitations.  Gastrointestinal:  Negative for  abdominal pain, blood in stool, constipation, diarrhea, nausea and vomiting.  Genitourinary:  Negative for hematuria.   Skin: Negative.   Neurological:  Negative for dizziness, headaches and light-headedness.  Hematological:  Does not bruise/bleed easily.     PHYSICAL EXAM:***  ECOG PERFORMANCE STATUS: 1 -  Symptomatic but completely ambulatory  There were no vitals filed for this visit.  There were no vitals filed for this visit.  Physical Exam Constitutional:      Appearance: Normal appearance. He is obese.  Cardiovascular:     Heart sounds: Normal heart sounds.  Pulmonary:     Breath sounds: Normal breath sounds.  Neurological:     General: No focal deficit present.     Mental Status: Mental status is at baseline.  Psychiatric:        Behavior: Behavior normal. Behavior is cooperative.    PAST MEDICAL/SURGICAL HISTORY:  Past Medical History:  Diagnosis Date   Anemia    Borderline diabetic    Chronic kidney disease    Diabetes mellitus without complication (HCC)    Duodenal adenoma    2021   GERD (gastroesophageal reflux disease)    Gout    Gouty arthritis    H/O small bowel obstruction    History of back surgery    Hx of adenomatous colonic polyps    2021   Hyperlipidemia    Hypertension    PE (pulmonary embolism)    Sleep apnea    Past Surgical History:  Procedure Laterality Date   ABDOMINAL SURGERY  2103   Smal bowel obstruction    ABDOMINAL SURGERY  1981   Perforated large intestine   back     Lower back   BIOPSY  08/28/2020   Procedure: BIOPSY;  Surgeon: Eartha Angelia Sieving, MD;  [facility];  Service: Gastroenterology;;   BIOPSY  02/07/2022   Procedure: BIOPSY;  Surgeon: Wilhelmenia Aloha Raddle., MD;  Location: [facility];  Service: Gastroenterology;;   CATARACT EXTRACTION W/PHACO Right 01/06/2020   Procedure: CATARACT EXTRACTION PHACO AND INTRAOCULAR LENS PLACEMENT (IOC) (CDE: 6.48);  Surgeon: Harrie Agent, MD;  Location: [facility];  Service: Ophthalmology;  Laterality: Right;   CATARACT EXTRACTION W/PHACO Left 01/20/2020   Procedure: CATARACT EXTRACTION PHACO AND INTRAOCULAR LENS PLACEMENT (IOC);  Surgeon: Harrie Agent, MD;  [facility]: [facility];  Service: Ophthalmology;  Laterality: Left;  CDE: 5.86   COLONOSCOPY N/A 05/09/2024   Procedure:  COLONOSCOPY;  Surgeon: Wilhelmenia Aloha Raddle., MD;  [facility]: [facility];  Service: Gastroenterology;  Laterality: N/A;   COLONOSCOPY WITH PROPOFOL  N/A 08/28/2020   Procedure: COLONOSCOPY WITH PROPOFOL ;  Surgeon: Eartha Angelia Sieving, MD;  [facility];  Service: Gastroenterology;  Laterality: N/A;  230   ENDOSCOPIC MUCOSAL RESECTION N/A 02/07/2022   Procedure: ENDOSCOPIC MUCOSAL RESECTION;  Surgeon: Wilhelmenia Aloha Raddle., MD;  [facility];  Service: Gastroenterology;  Laterality: N/A;   ENDOSCOPIC MUCOSAL RESECTION  05/09/2024   Procedure: RESECTION, MUCOSAL LESION, GI TRACT, ENDOSCOPIC;  Surgeon: Wilhelmenia Aloha Raddle., MD;  [facility]: [facility];  Service: Gastroenterology;;   ESOPHAGOGASTRODUODENOSCOPY N/A 02/07/2022   Procedure: ESOPHAGOGASTRODUODENOSCOPY (EGD);  Surgeon: Wilhelmenia Aloha Raddle., MD;  Location: [facility];  Service: Gastroenterology;  Laterality: N/A;   ESOPHAGOGASTRODUODENOSCOPY N/A 05/09/2024   Procedure: EGD (ESOPHAGOGASTRODUODENOSCOPY);  Surgeon: Wilhelmenia Aloha Raddle., MD;  Location: [facility];  Service: Gastroenterology;  Laterality: N/A;   ESOPHAGOGASTRODUODENOSCOPY (EGD) WITH PROPOFOL  N/A 08/28/2020   Procedure: ESOPHAGOGASTRODUODENOSCOPY (EGD) WITH PROPOFOL ;  Surgeon: Eartha Angelia Sieving, MD;  [facility];  Service: Gastroenterology;  Laterality: N/A;   ESOPHAGOGASTRODUODENOSCOPY (EGD) WITH PROPOFOL  N/A 12/14/2020   Procedure: ESOPHAGOGASTRODUODENOSCOPY (EGD) WITH PROPOFOL ;  Surgeon: Wilhelmenia Aloha Raddle., MD;  [facility];  Service: Gastroenterology;  Laterality: N/A;   EUS N/A 05/09/2024   Procedure: ULTRASOUND, UPPER GI TRACT, ENDOSCOPIC;  Surgeon: Wilhelmenia Aloha Raddle., MD;  [facility];  Service: Gastroenterology;  Laterality: N/A;   GIVENS CAPSULE STUDY N/A 09/10/2020   Procedure: GIVENS CAPSULE STUDY;  Surgeon: Eartha Angelia Sieving, MD;  [facility];  Service:  Gastroenterology;  Laterality: N/A;  730   HEMOSTASIS CLIP PLACEMENT  12/14/2020   Procedure: HEMOSTASIS CLIP PLACEMENT;  Surgeon: Wilhelmenia Aloha Raddle., MD;  Location: [facility];  Service: Gastroenterology;;   HEMOSTASIS CLIP PLACEMENT  02/07/2022   Procedure: HEMOSTASIS CLIP PLACEMENT;  Surgeon: Wilhelmenia Aloha Raddle., MD;  Location: [facility];  Service: Gastroenterology;;   HEMOSTASIS CLIP PLACEMENT  05/09/2024   Procedure: CONTROL OF HEMORRHAGE, GI TRACT, ENDOSCOPIC, BY CLIPPING OR OVERSEWING;  Surgeon: Wilhelmenia Aloha Raddle., MD;  Location: [facility];  Service: Gastroenterology;;   HEMOSTASIS CONTROL  12/14/2020   Procedure: HEMOSTASIS CONTROL;  Surgeon: Wilhelmenia Aloha Raddle., MD;  Location: [facility];  Service: Gastroenterology;;   HERNIA REPAIR     incisional   HOT HEMOSTASIS N/A 02/07/2022   Procedure: HOT HEMOSTASIS (ARGON PLASMA COAGULATION/BICAP);  Surgeon: Wilhelmenia Aloha Raddle., MD;  Location: [facility];  Service: Gastroenterology;  Laterality: N/A;   POLYPECTOMY  08/28/2020   Procedure: POLYPECTOMY;  Surgeon: Eartha Angelia Sieving, MD;  [facility];  Service: Gastroenterology;;   POLYPECTOMY  12/14/2020   Procedure: POLYPECTOMY;  Surgeon: Wilhelmenia Aloha Raddle., MD;  Location: [facility];  Service: Gastroenterology;;   MATIAS  12/14/2020   Procedure: MATIAS;  Surgeon: Mansouraty, Aloha Raddle., MD;  Location: [facility];  Service: Gastroenterology;;   ROBLEY MEYER INJECTION  02/07/2022   Procedure: SUBMUCOSAL LIFTING INJECTION;  Surgeon: Wilhelmenia Aloha Raddle., MD;  Location: [facility];  Service: Gastroenterology;;   SUBMUCOSAL TATTOO INJECTION  12/14/2020   Procedure: SUBMUCOSAL TATTOO INJECTION;  Surgeon: Wilhelmenia Aloha Raddle., MD;  [facility];  Service: Gastroenterology;;    SOCIAL HISTORY:  Social History   Socioeconomic History   Marital status: Married    Spouse name: Not on file   Number of  children: 1   Years of education: Not on file   Highest education level: Not on file  Occupational History   Occupation: Finisher  Tobacco Use   Smoking status: Former    Current packs/day: 0.00    Average packs/day: 1 pack/day for 38.0 years (38.0 ttl pk-yrs)    Types: Cigarettes    Start date: 02/06/1957    Quit date: 02/07/1995    Years since quitting: 29.7   Smokeless tobacco: Never  Vaping Use   Vaping status: Never Used  Substance and Sexual Activity   Alcohol  use: Yes    Alcohol /week: 3.0 standard drinks of alcohol     Types: 1 Cans of beer, 2 Shots of liquor per week    Comment: occasional    Drug use: Not Currently    Types: Marijuana   Sexual activity: Yes  Other Topics Concern   Not on file  Social History Narrative   Lives with wife.     Social Drivers of Health   Tobacco Use: Medium Risk (10/10/2024)   Received from [facility]   Patient History    Smoking Tobacco Use: Former    Smokeless Tobacco Use: Never  Passive Exposure: Not on file  Financial Resource Strain: Not on file  Food Insecurity: No Food Insecurity (09/24/2024)   Received from [facility]   Epic    Within the past 12 months, you worried that your food would run out before you got the money to buy more.: Never true    Within the past 12 months, the food you bought just didn't last and you didn't have money to get more.: Never true  Transportation Needs: No Transportation Needs (09/24/2024)   Received from [facility]    In the past 12 months, has lack of transportation kept you from medical appointments or from getting medications?: No    In the past 12 months, has lack of transportation kept you from meetings, work, or from getting things needed for daily living?: No  Physical Activity: Not on file  Stress: No Stress Concern Present (09/24/2024)   Received from [facility] Occupational Health - Occupational Stress Questionnaire    Do you feel stress -  tense, restless, nervous, or anxious, or unable to sleep at night because your mind is troubled all the time - these days?: Not at all  Social Connections: Not on file  Intimate Partner Violence: Not At Risk (09/24/2024)   Received from [facility]   HITS    Over the last 12 months how often did your partner physically hurt you?: Never    Over the last 12 months how often did your partner insult you or talk down to you?: Never    Over the last 12 months how often did your partner threaten you with physical harm?: Never    Over the last 12 months how often did your partner scream or curse at you?: Never  Depression (PHQ2-9): Low Risk (07/24/2024)   Depression (PHQ2-9)    PHQ-2 Score: 0  Alcohol  Screen: Not on file  Housing: Unknown (09/24/2024)   Received from [facility]    In the last 12 months, was there a time when you were not able to pay the mortgage or rent on time?: No    Number of Times Moved in the Last Year: Not on file    At any time in the past 12 months, were you homeless or living in a shelter (including now)?: No  Utilities: Not At Risk (09/24/2024)   Received from [facility]    In the past 12 months has the electric, gas, oil, or water  company threatened to shut off services in your home?: No  Health Literacy: Not on file    FAMILY HISTORY:  Family History  Problem Relation Age of Onset   Hypertension Sister    Diabetes Sister    Diabetes Sister    Hypertension Mother    Diabetes Mother    Hypertension Father    Ulcers Father    Colon cancer Neg Hx    Esophageal cancer Neg Hx    Stomach cancer Neg Hx    Inflammatory bowel disease Neg Hx    Liver disease Neg Hx    Pancreatic cancer Neg Hx    Rectal cancer Neg Hx     CURRENT MEDICATIONS:  Outpatient Encounter Medications as of 10/30/2024  Medication Sig Note   albuterol  (VENTOLIN  HFA) 108 (90 Base) MCG/ACT inhaler Inhale 2 puffs into the lungs every 4 (four) hours as needed.     allopurinol  (ZYLOPRIM ) 100 MG tablet Take 1 tablet (100 mg  total) by mouth daily.    amLODipine  (NORVASC ) 5 MG tablet Take 10 mg by mouth daily. 11/22/2023: Taking 10mg  once a day    apixaban  (ELIQUIS ) 5 MG TABS tablet Take 5 mg by mouth 2 (two) times daily.    atorvastatin  (LIPITOR) 40 MG tablet Take 1 tablet (40 mg total) by mouth daily.    bisoprolol  (ZEBETA ) 5 MG tablet Take 1 tablet (5 mg total) by mouth daily.    calcitRIOL (ROCALTROL) 0.25 MCG capsule Take 0.25 mcg by mouth.    epoetin  alfa (EPOGEN ) 3000 UNIT/ML injection Inject into the skin.    epoetin  alfa-epbx (RETACRIT ) 3000 UNIT/ML injection Inject 3,000 Units into the vein every 14 (fourteen) days.    FEROSUL 325 (65 Fe) MG tablet TAKE ONE TABLET BY MOUTH EVERY MORNING WITH BREAKFAST    furosemide  (LASIX ) 40 MG tablet Take 40 mg by mouth 2 (two) times daily.    hydrALAZINE  (APRESOLINE ) 25 MG tablet Take 1 tablet (25 mg total) by mouth every 6 (six) hours. 01/23/2023: His after visit summary from Dr. Rachele says 2 tabs TID   ipratropium-albuterol  (DUONEB) 0.5-2.5 (3) MG/3ML SOLN USE 1 AMPULE IN NEBULIZER EVERY 6 HOURS AS NEEDED    Multiple Vitamin (MULTIVITAMIN WITH MINERALS) TABS tablet Take 1 tablet by mouth daily.    Na Sulfate-K Sulfate-Mg Sulfate concentrate (SUPREP BOWEL PREP KIT) 17.5-3.13-1.6 GM/177ML SOLN Take 1 kit (354 mLs total) by mouth as directed. For colonoscopy prep    pantoprazole  (PROTONIX ) 40 MG tablet Take 1 tablet (40 mg total) by mouth 2 (two) times daily before a meal.    spironolactone (ALDACTONE) 25 MG tablet Take 25 mg by mouth daily.    TRULICITY 0.75 MG/0.5ML SOPN Inject 0.75 mg into the skin every Friday.    No facility-administered encounter medications on file as of 10/30/2024.    ALLERGIES:  No Known Allergies  LABORATORY DATA:  I have reviewed the labs as listed.  CBC    Component Value Date/Time   WBC 3.6 (L) 10/02/2024 0908   RBC 3.45 (L) 10/02/2024 0908   HGB 10.3 (L) 10/02/2024 0908    HCT 30.5 (L) 10/02/2024 0908   PLT 161 10/02/2024 0908   MCV 88.4 10/02/2024 0908   MCV 84.2 04/12/2013 1501   MCH 29.9 10/02/2024 0908   MCHC 33.8 10/02/2024 0908   RDW 15.4 10/02/2024 0908   LYMPHSABS 1.8 07/24/2024 0800   MONOABS 0.6 07/24/2024 0800   EOSABS 0.2 07/24/2024 0800   BASOSABS 0.0 07/24/2024 0800      Latest Ref Rng & Units 09/06/2024   12:13 PM 07/24/2024    8:00 AM 07/19/2024    1:12 PM  CMP  Glucose 70 - 99 mg/dL 881  890  890   BUN 8 - 23 mg/dL 90  76  90   Creatinine 0.61 - 1.24 mg/dL 5.25  5.47  5.41   Sodium 135 - 145 mmol/L 131  134  131   Potassium 3.5 - 5.1 mmol/L 4.4  4.1  4.3   Chloride 98 - 111 mmol/L 96  98  94   CO2 22 - 32 mmol/L 19  22  18    Calcium  8.9 - 10.3 mg/dL 8.9  8.7  9.0     DIAGNOSTIC IMAGING:  I have independently reviewed the relevant imaging and discussed with the patient.   WRAP UP:  All questions were answered. The patient knows to call the clinic with any problems, questions or concerns.  Medical decision making: Low***  Time spent on visit: I spent *** minutes counseling the patient face to face. The total time spent in the appointment was *** minutes and more than 50% was on counseling.  Pleasant CHRISTELLA Barefoot, PA-C  *** "

## 2024-10-30 ENCOUNTER — Inpatient Hospital Stay (HOSPITAL_BASED_OUTPATIENT_CLINIC_OR_DEPARTMENT_OTHER): Admitting: Physician Assistant

## 2024-10-30 ENCOUNTER — Other Ambulatory Visit: Payer: Self-pay

## 2024-10-30 ENCOUNTER — Inpatient Hospital Stay

## 2024-10-30 ENCOUNTER — Encounter: Payer: Self-pay | Admitting: Physician Assistant

## 2024-10-30 VITALS — BP 105/63 | HR 80 | Temp 98.3°F | Resp 18 | Ht 69.0 in | Wt 201.3 lb

## 2024-10-30 DIAGNOSIS — D631 Anemia in chronic kidney disease: Secondary | ICD-10-CM

## 2024-10-30 DIAGNOSIS — I132 Hypertensive heart and chronic kidney disease with heart failure and with stage 5 chronic kidney disease, or end stage renal disease: Secondary | ICD-10-CM | POA: Diagnosis not present

## 2024-10-30 DIAGNOSIS — N186 End stage renal disease: Secondary | ICD-10-CM | POA: Diagnosis not present

## 2024-10-30 LAB — BASIC METABOLIC PANEL WITH GFR
Anion gap: 17 — ABNORMAL HIGH (ref 5–15)
BUN: 22 mg/dL (ref 8–23)
CO2: 25 mmol/L (ref 22–32)
Calcium: 9.1 mg/dL (ref 8.9–10.3)
Chloride: 95 mmol/L — ABNORMAL LOW (ref 98–111)
Creatinine, Ser: 3.32 mg/dL — ABNORMAL HIGH (ref 0.61–1.24)
GFR, Estimated: 18 mL/min — ABNORMAL LOW
Glucose, Bld: 143 mg/dL — ABNORMAL HIGH (ref 70–99)
Potassium: 3.3 mmol/L — ABNORMAL LOW (ref 3.5–5.1)
Sodium: 136 mmol/L (ref 135–145)

## 2024-10-30 LAB — CBC WITH DIFFERENTIAL/PLATELET
Abs Immature Granulocytes: 0.04 K/uL (ref 0.00–0.07)
Basophils Absolute: 0 K/uL (ref 0.0–0.1)
Basophils Relative: 1 %
Eosinophils Absolute: 0.2 K/uL (ref 0.0–0.5)
Eosinophils Relative: 4 %
HCT: 29.2 % — ABNORMAL LOW (ref 39.0–52.0)
Hemoglobin: 9.4 g/dL — ABNORMAL LOW (ref 13.0–17.0)
Immature Granulocytes: 1 %
Lymphocytes Relative: 22 %
Lymphs Abs: 1.2 K/uL (ref 0.7–4.0)
MCH: 29.2 pg (ref 26.0–34.0)
MCHC: 32.2 g/dL (ref 30.0–36.0)
MCV: 90.7 fL (ref 80.0–100.0)
Monocytes Absolute: 0.6 K/uL (ref 0.1–1.0)
Monocytes Relative: 10 %
Neutro Abs: 3.4 K/uL (ref 1.7–7.7)
Neutrophils Relative %: 62 %
Platelets: 181 K/uL (ref 150–400)
RBC: 3.22 MIL/uL — ABNORMAL LOW (ref 4.22–5.81)
RDW: 14.9 % (ref 11.5–15.5)
WBC: 5.4 K/uL (ref 4.0–10.5)
nRBC: 0 % (ref 0.0–0.2)

## 2024-10-30 LAB — IRON AND TIBC
Iron: 81 ug/dL (ref 45–182)
Saturation Ratios: 31 % (ref 17.9–39.5)
TIBC: 262 ug/dL (ref 250–450)
UIBC: 181 ug/dL

## 2024-10-30 LAB — FERRITIN: Ferritin: 763 ng/mL — ABNORMAL HIGH (ref 24–336)

## 2024-10-30 NOTE — Patient Instructions (Signed)
 [facility] at [facility] **VISIT SUMMARY & IMPORTANT INSTRUCTIONS **   You were seen today by Pleasant Barefoot PA-C for your anemia.   Your anemia is related to your chronic kidney disease. Your anemia is now being managed by your nephrologist, since you are receiving Retacrit  injections and IV iron  along with dialysis. You do not need any further appointments in the hematology clinic [facility]). We will discharge you to the care of your nephrologist and primary care provider, but you can be referred back to us  in the future as needed.  ** Thank you for trusting me with your healthcare!  I strive to provide all of my patients with quality care at each visit.  If you receive a survey for this visit, I would be so grateful to you for taking the time to provide feedback.  Thank you in advance!  ~ Aniqua Briere                                        Dr. Mickiel Davonna Pleasant Barefoot, PA-C     Delon Hope, NP   - - - - - - - - - - - - - - - - - -     Thank you for choosing [facility] at [facility] to provide your oncology and hematology care.  To afford each patient quality time with our provider, please arrive at least 15 minutes before your scheduled appointment time.   If you have a lab appointment with the Cancer Center please come in thru the Main Entrance and check in at the main information desk.  You need to re-schedule your appointment should you arrive 10 or more minutes late.  We strive to give you quality time with our providers, and arriving late affects you and other patients whose appointments are after yours.  Also, if you no show three or more times for appointments you may be dismissed from the clinic at the providers discretion.     Again, thank you for choosing [facility].  Our hope is that these requests will decrease the amount of time that you wait before being seen by our physicians.        _____________________________________________________________  Should you have questions after your visit to [facility], please contact our office at [phone] and follow the prompts.  Our office hours are 8:00 a.m. and 4:30 p.m. Monday - Friday.  Please note that voicemails left after 4:00 p.m. may not be returned until the following business day.  We are closed weekends and major holidays.  You do have access to a nurse 24-7, just call the main number to the clinic [phone] and do not press any options, hold on the line and a nurse will answer the phone.    For prescription refill requests, have your pharmacy contact our office and allow 72 hours.

## 2024-12-06 ENCOUNTER — Encounter (HOSPITAL_COMMUNITY): Payer: Self-pay | Admitting: Vascular Surgery

## 2025-02-25 ENCOUNTER — Other Ambulatory Visit
